# Patient Record
Sex: Male | Born: 1940 | Race: White | Hispanic: No | Smoking: Former smoker
Health system: Southern US, Community
[De-identification: ages and names within clinical notes are randomized; demographics above are authoritative.]

## PROBLEM LIST (undated history)

## (undated) ENCOUNTER — Emergency Department

## (undated) DIAGNOSIS — I513 Intracardiac thrombosis, not elsewhere classified: Secondary | ICD-10-CM

## (undated) DIAGNOSIS — K635 Polyp of colon: Secondary | ICD-10-CM

## (undated) DIAGNOSIS — H269 Unspecified cataract: Secondary | ICD-10-CM

## (undated) DIAGNOSIS — Z860100 Personal history of colon polyps, unspecified: Secondary | ICD-10-CM

## (undated) DIAGNOSIS — U071 COVID-19: Secondary | ICD-10-CM

## (undated) DIAGNOSIS — I4891 Unspecified atrial fibrillation: Secondary | ICD-10-CM

## (undated) DIAGNOSIS — T7840XA Allergy, unspecified, initial encounter: Secondary | ICD-10-CM

## (undated) DIAGNOSIS — I639 Cerebral infarction, unspecified: Secondary | ICD-10-CM

## (undated) DIAGNOSIS — E785 Hyperlipidemia, unspecified: Secondary | ICD-10-CM

## (undated) DIAGNOSIS — D696 Thrombocytopenia, unspecified: Secondary | ICD-10-CM

## (undated) DIAGNOSIS — C4490 Unspecified malignant neoplasm of skin, unspecified: Secondary | ICD-10-CM

## (undated) DIAGNOSIS — C44310 Basal cell carcinoma of skin of unspecified parts of face: Secondary | ICD-10-CM

## (undated) DIAGNOSIS — M1990 Unspecified osteoarthritis, unspecified site: Secondary | ICD-10-CM

## (undated) DIAGNOSIS — M7541 Impingement syndrome of right shoulder: Secondary | ICD-10-CM

## (undated) DIAGNOSIS — I10 Essential (primary) hypertension: Secondary | ICD-10-CM

## (undated) DIAGNOSIS — D123 Benign neoplasm of transverse colon: Secondary | ICD-10-CM

## (undated) DIAGNOSIS — I2510 Atherosclerotic heart disease of native coronary artery without angina pectoris: Secondary | ICD-10-CM

## (undated) DIAGNOSIS — K219 Gastro-esophageal reflux disease without esophagitis: Secondary | ICD-10-CM

## (undated) DIAGNOSIS — C320 Malignant neoplasm of glottis: Secondary | ICD-10-CM

## (undated) DIAGNOSIS — Z923 Personal history of irradiation: Secondary | ICD-10-CM

## (undated) DIAGNOSIS — D120 Benign neoplasm of cecum: Secondary | ICD-10-CM

## (undated) DIAGNOSIS — I219 Acute myocardial infarction, unspecified: Secondary | ICD-10-CM

## (undated) DIAGNOSIS — H1851 Endothelial corneal dystrophy: Secondary | ICD-10-CM

## (undated) DIAGNOSIS — I519 Heart disease, unspecified: Secondary | ICD-10-CM

## (undated) DIAGNOSIS — E119 Type 2 diabetes mellitus without complications: Secondary | ICD-10-CM

## (undated) DIAGNOSIS — I255 Ischemic cardiomyopathy: Secondary | ICD-10-CM

## (undated) DIAGNOSIS — E559 Vitamin D deficiency, unspecified: Secondary | ICD-10-CM

## (undated) HISTORY — DX: Hyperlipidemia, unspecified: E78.5

## (undated) HISTORY — DX: Impingement syndrome of right shoulder: M75.41

## (undated) HISTORY — DX: Personal history of colonic polyps: Z86.010

## (undated) HISTORY — DX: COVID-19: U07.1

## (undated) HISTORY — DX: Essential (primary) hypertension: I10

## (undated) HISTORY — PX: MOHS SURGERY: SUR867

## (undated) HISTORY — DX: Endothelial corneal dystrophy: H18.51

## (undated) HISTORY — DX: Personal history of colon polyps, unspecified: Z86.0100

## (undated) HISTORY — PX: KNEE ARTHROSCOPY: SUR90

## (undated) HISTORY — DX: Personal history of irradiation: Z92.3

## (undated) HISTORY — DX: Unspecified malignant neoplasm of skin, unspecified: C44.90

## (undated) HISTORY — DX: Heart disease, unspecified: I51.9

## (undated) HISTORY — DX: Benign neoplasm of cecum: D12.0

## (undated) HISTORY — DX: Cerebral infarction, unspecified: I63.9

## (undated) HISTORY — PX: OTHER SURGICAL HISTORY: SHX169

## (undated) HISTORY — DX: Intracardiac thrombosis, not elsewhere classified: I51.3

## (undated) HISTORY — DX: Polyp of colon: K63.5

## (undated) HISTORY — DX: Malignant neoplasm of glottis: C32.0

## (undated) HISTORY — PX: JOINT REPLACEMENT: SHX530

## (undated) HISTORY — DX: Vitamin D deficiency, unspecified: E55.9

## (undated) HISTORY — DX: Unspecified cataract: H26.9

## (undated) HISTORY — DX: Benign neoplasm of transverse colon: D12.3

## (undated) HISTORY — DX: Ischemic cardiomyopathy: I25.5

## (undated) HISTORY — DX: Acute myocardial infarction, unspecified: I21.9

## (undated) HISTORY — PX: EYE SURGERY: SHX253

## (undated) HISTORY — DX: Unspecified osteoarthritis, unspecified site: M19.90

## (undated) HISTORY — DX: Basal cell carcinoma of skin of unspecified parts of face: C44.310

## (undated) HISTORY — DX: Unspecified atrial fibrillation: I48.91

## (undated) HISTORY — DX: Allergy, unspecified, initial encounter: T78.40XA

## (undated) HISTORY — PX: HAND SURGERY: SHX662

## (undated) HISTORY — DX: Thrombocytopenia, unspecified: D69.6

---
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
## 1981-11-02 DIAGNOSIS — I4891 Unspecified atrial fibrillation: Secondary | ICD-10-CM

## 1981-11-02 HISTORY — DX: Unspecified atrial fibrillation: I48.91

## 1991-11-03 HISTORY — PX: BICEPS TENDON REPAIR: SHX566

## 2005-11-02 HISTORY — PX: COLONOSCOPY: SHX174

## 2006-11-02 DIAGNOSIS — C320 Malignant neoplasm of glottis: Secondary | ICD-10-CM

## 2006-11-02 HISTORY — DX: Malignant neoplasm of glottis: C32.0

## 2013-09-04 DIAGNOSIS — H3553 Other dystrophies primarily involving the sensory retina: Secondary | ICD-10-CM | POA: Insufficient documentation

## 2013-09-04 HISTORY — DX: Other dystrophies primarily involving the sensory retina: H35.53

## 2013-12-12 DIAGNOSIS — J370 Chronic laryngitis: Secondary | ICD-10-CM | POA: Diagnosis not present

## 2013-12-12 DIAGNOSIS — H903 Sensorineural hearing loss, bilateral: Secondary | ICD-10-CM | POA: Diagnosis not present

## 2013-12-28 DIAGNOSIS — R5381 Other malaise: Secondary | ICD-10-CM | POA: Diagnosis not present

## 2013-12-28 DIAGNOSIS — Z125 Encounter for screening for malignant neoplasm of prostate: Secondary | ICD-10-CM | POA: Diagnosis not present

## 2013-12-28 DIAGNOSIS — Z0000 Encounter for general adult medical examination without abnormal findings: Secondary | ICD-10-CM | POA: Diagnosis not present

## 2013-12-28 DIAGNOSIS — R5383 Other fatigue: Secondary | ICD-10-CM | POA: Diagnosis not present

## 2013-12-28 DIAGNOSIS — E782 Mixed hyperlipidemia: Secondary | ICD-10-CM | POA: Diagnosis not present

## 2013-12-28 LAB — TSH: TSH: 1.32

## 2013-12-28 LAB — PSA: PSA: 1.76

## 2014-01-03 DIAGNOSIS — Z23 Encounter for immunization: Secondary | ICD-10-CM | POA: Diagnosis not present

## 2014-01-03 DIAGNOSIS — Z6829 Body mass index (BMI) 29.0-29.9, adult: Secondary | ICD-10-CM | POA: Diagnosis not present

## 2014-01-03 DIAGNOSIS — Z0000 Encounter for general adult medical examination without abnormal findings: Secondary | ICD-10-CM | POA: Diagnosis not present

## 2014-01-03 DIAGNOSIS — Z125 Encounter for screening for malignant neoplasm of prostate: Secondary | ICD-10-CM | POA: Diagnosis not present

## 2014-01-03 DIAGNOSIS — R5383 Other fatigue: Secondary | ICD-10-CM | POA: Diagnosis not present

## 2014-01-03 DIAGNOSIS — R5381 Other malaise: Secondary | ICD-10-CM | POA: Diagnosis not present

## 2014-01-03 DIAGNOSIS — R9431 Abnormal electrocardiogram [ECG] [EKG]: Secondary | ICD-10-CM | POA: Diagnosis not present

## 2014-01-03 DIAGNOSIS — Z1211 Encounter for screening for malignant neoplasm of colon: Secondary | ICD-10-CM | POA: Diagnosis not present

## 2014-01-03 LAB — FECAL OCCULT BLOOD, IMMUNOCHEMICAL: Fecal Occult Blood: NEGATIVE

## 2014-01-09 DIAGNOSIS — H2510 Age-related nuclear cataract, unspecified eye: Secondary | ICD-10-CM | POA: Diagnosis not present

## 2014-01-09 DIAGNOSIS — H18519 Endothelial corneal dystrophy, unspecified eye: Secondary | ICD-10-CM | POA: Diagnosis not present

## 2014-01-09 DIAGNOSIS — H353190 Nonexudative age-related macular degeneration, unspecified eye, stage unspecified: Secondary | ICD-10-CM | POA: Diagnosis not present

## 2014-01-15 DIAGNOSIS — L821 Other seborrheic keratosis: Secondary | ICD-10-CM | POA: Diagnosis not present

## 2014-01-15 DIAGNOSIS — C44519 Basal cell carcinoma of skin of other part of trunk: Secondary | ICD-10-CM | POA: Diagnosis not present

## 2014-01-15 DIAGNOSIS — L820 Inflamed seborrheic keratosis: Secondary | ICD-10-CM | POA: Diagnosis not present

## 2014-01-15 DIAGNOSIS — Z85828 Personal history of other malignant neoplasm of skin: Secondary | ICD-10-CM | POA: Diagnosis not present

## 2014-01-15 DIAGNOSIS — I781 Nevus, non-neoplastic: Secondary | ICD-10-CM | POA: Diagnosis not present

## 2014-01-15 DIAGNOSIS — L570 Actinic keratosis: Secondary | ICD-10-CM | POA: Diagnosis not present

## 2014-01-15 DIAGNOSIS — D235 Other benign neoplasm of skin of trunk: Secondary | ICD-10-CM | POA: Diagnosis not present

## 2014-02-12 DIAGNOSIS — L570 Actinic keratosis: Secondary | ICD-10-CM | POA: Diagnosis not present

## 2014-02-19 DIAGNOSIS — C44519 Basal cell carcinoma of skin of other part of trunk: Secondary | ICD-10-CM | POA: Diagnosis not present

## 2014-04-25 DIAGNOSIS — C44519 Basal cell carcinoma of skin of other part of trunk: Secondary | ICD-10-CM | POA: Diagnosis not present

## 2014-05-09 DIAGNOSIS — L570 Actinic keratosis: Secondary | ICD-10-CM | POA: Diagnosis not present

## 2014-05-30 DIAGNOSIS — E1310 Other specified diabetes mellitus with ketoacidosis without coma: Secondary | ICD-10-CM | POA: Diagnosis not present

## 2014-05-30 DIAGNOSIS — I491 Atrial premature depolarization: Secondary | ICD-10-CM | POA: Diagnosis not present

## 2014-05-30 DIAGNOSIS — Z923 Personal history of irradiation: Secondary | ICD-10-CM | POA: Diagnosis not present

## 2014-05-30 DIAGNOSIS — I10 Essential (primary) hypertension: Secondary | ICD-10-CM | POA: Diagnosis present

## 2014-05-30 DIAGNOSIS — R9431 Abnormal electrocardiogram [ECG] [EKG]: Secondary | ICD-10-CM | POA: Diagnosis not present

## 2014-05-30 DIAGNOSIS — K219 Gastro-esophageal reflux disease without esophagitis: Secondary | ICD-10-CM | POA: Diagnosis present

## 2014-05-30 DIAGNOSIS — I2699 Other pulmonary embolism without acute cor pulmonale: Secondary | ICD-10-CM | POA: Diagnosis not present

## 2014-05-30 DIAGNOSIS — I059 Rheumatic mitral valve disease, unspecified: Secondary | ICD-10-CM | POA: Diagnosis not present

## 2014-05-30 DIAGNOSIS — I2109 ST elevation (STEMI) myocardial infarction involving other coronary artery of anterior wall: Secondary | ICD-10-CM | POA: Diagnosis not present

## 2014-05-30 DIAGNOSIS — R079 Chest pain, unspecified: Secondary | ICD-10-CM | POA: Diagnosis not present

## 2014-05-30 DIAGNOSIS — I253 Aneurysm of heart: Secondary | ICD-10-CM | POA: Diagnosis not present

## 2014-05-30 DIAGNOSIS — C139 Malignant neoplasm of hypopharynx, unspecified: Secondary | ICD-10-CM | POA: Diagnosis not present

## 2014-05-30 DIAGNOSIS — M129 Arthropathy, unspecified: Secondary | ICD-10-CM | POA: Diagnosis not present

## 2014-05-30 DIAGNOSIS — I5189 Other ill-defined heart diseases: Secondary | ICD-10-CM | POA: Diagnosis present

## 2014-05-30 DIAGNOSIS — I2589 Other forms of chronic ischemic heart disease: Secondary | ICD-10-CM | POA: Diagnosis not present

## 2014-05-30 DIAGNOSIS — I1311 Hypertensive heart and chronic kidney disease without heart failure, with stage 5 chronic kidney disease, or end stage renal disease: Secondary | ICD-10-CM | POA: Diagnosis not present

## 2014-05-30 DIAGNOSIS — I509 Heart failure, unspecified: Secondary | ICD-10-CM | POA: Diagnosis not present

## 2014-05-30 DIAGNOSIS — I2510 Atherosclerotic heart disease of native coronary artery without angina pectoris: Secondary | ICD-10-CM | POA: Diagnosis not present

## 2014-05-30 DIAGNOSIS — I729 Aneurysm of unspecified site: Secondary | ICD-10-CM | POA: Diagnosis not present

## 2014-05-30 DIAGNOSIS — E785 Hyperlipidemia, unspecified: Secondary | ICD-10-CM | POA: Diagnosis present

## 2014-05-30 DIAGNOSIS — I080 Rheumatic disorders of both mitral and aortic valves: Secondary | ICD-10-CM | POA: Diagnosis present

## 2014-05-30 DIAGNOSIS — Z85819 Personal history of malignant neoplasm of unspecified site of lip, oral cavity, and pharynx: Secondary | ICD-10-CM | POA: Diagnosis not present

## 2014-05-30 DIAGNOSIS — D696 Thrombocytopenia, unspecified: Secondary | ICD-10-CM | POA: Diagnosis not present

## 2014-05-30 DIAGNOSIS — I5042 Chronic combined systolic (congestive) and diastolic (congestive) heart failure: Secondary | ICD-10-CM | POA: Diagnosis not present

## 2014-05-30 DIAGNOSIS — I2541 Coronary artery aneurysm: Secondary | ICD-10-CM | POA: Diagnosis not present

## 2014-05-30 DIAGNOSIS — I4949 Other premature depolarization: Secondary | ICD-10-CM | POA: Diagnosis present

## 2014-05-30 DIAGNOSIS — I219 Acute myocardial infarction, unspecified: Secondary | ICD-10-CM | POA: Diagnosis not present

## 2014-05-30 DIAGNOSIS — I749 Embolism and thrombosis of unspecified artery: Secondary | ICD-10-CM | POA: Diagnosis not present

## 2014-05-30 DIAGNOSIS — R030 Elevated blood-pressure reading, without diagnosis of hypertension: Secondary | ICD-10-CM | POA: Diagnosis not present

## 2014-05-30 DIAGNOSIS — Z87891 Personal history of nicotine dependence: Secondary | ICD-10-CM | POA: Diagnosis not present

## 2014-05-30 DIAGNOSIS — I428 Other cardiomyopathies: Secondary | ICD-10-CM | POA: Diagnosis not present

## 2014-05-30 DIAGNOSIS — M1710 Unilateral primary osteoarthritis, unspecified knee: Secondary | ICD-10-CM | POA: Diagnosis present

## 2014-05-30 DIAGNOSIS — I079 Rheumatic tricuspid valve disease, unspecified: Secondary | ICD-10-CM | POA: Diagnosis not present

## 2014-06-05 DIAGNOSIS — I219 Acute myocardial infarction, unspecified: Secondary | ICD-10-CM | POA: Diagnosis not present

## 2014-06-08 DIAGNOSIS — I219 Acute myocardial infarction, unspecified: Secondary | ICD-10-CM | POA: Diagnosis not present

## 2014-06-11 DIAGNOSIS — I219 Acute myocardial infarction, unspecified: Secondary | ICD-10-CM | POA: Diagnosis not present

## 2014-06-11 DIAGNOSIS — I2589 Other forms of chronic ischemic heart disease: Secondary | ICD-10-CM | POA: Diagnosis not present

## 2014-06-11 DIAGNOSIS — Z6828 Body mass index (BMI) 28.0-28.9, adult: Secondary | ICD-10-CM | POA: Diagnosis not present

## 2014-06-11 DIAGNOSIS — M1710 Unilateral primary osteoarthritis, unspecified knee: Secondary | ICD-10-CM | POA: Diagnosis not present

## 2014-06-12 DIAGNOSIS — I2510 Atherosclerotic heart disease of native coronary artery without angina pectoris: Secondary | ICD-10-CM | POA: Diagnosis not present

## 2014-06-12 DIAGNOSIS — I428 Other cardiomyopathies: Secondary | ICD-10-CM | POA: Diagnosis not present

## 2014-06-12 DIAGNOSIS — C139 Malignant neoplasm of hypopharynx, unspecified: Secondary | ICD-10-CM | POA: Diagnosis not present

## 2014-06-12 DIAGNOSIS — I219 Acute myocardial infarction, unspecified: Secondary | ICD-10-CM | POA: Diagnosis not present

## 2014-06-18 DIAGNOSIS — I219 Acute myocardial infarction, unspecified: Secondary | ICD-10-CM | POA: Diagnosis not present

## 2014-06-25 DIAGNOSIS — I219 Acute myocardial infarction, unspecified: Secondary | ICD-10-CM | POA: Diagnosis not present

## 2014-07-06 DIAGNOSIS — I219 Acute myocardial infarction, unspecified: Secondary | ICD-10-CM | POA: Diagnosis not present

## 2014-07-12 DIAGNOSIS — H2510 Age-related nuclear cataract, unspecified eye: Secondary | ICD-10-CM | POA: Diagnosis not present

## 2014-07-12 DIAGNOSIS — H353190 Nonexudative age-related macular degeneration, unspecified eye, stage unspecified: Secondary | ICD-10-CM | POA: Diagnosis not present

## 2014-07-12 DIAGNOSIS — H25019 Cortical age-related cataract, unspecified eye: Secondary | ICD-10-CM | POA: Diagnosis not present

## 2014-07-30 DIAGNOSIS — Z23 Encounter for immunization: Secondary | ICD-10-CM | POA: Diagnosis not present

## 2014-07-30 DIAGNOSIS — M1710 Unilateral primary osteoarthritis, unspecified knee: Secondary | ICD-10-CM | POA: Diagnosis not present

## 2014-07-30 DIAGNOSIS — I219 Acute myocardial infarction, unspecified: Secondary | ICD-10-CM | POA: Diagnosis not present

## 2014-07-30 DIAGNOSIS — Z6829 Body mass index (BMI) 29.0-29.9, adult: Secondary | ICD-10-CM | POA: Diagnosis not present

## 2014-07-30 DIAGNOSIS — I2589 Other forms of chronic ischemic heart disease: Secondary | ICD-10-CM | POA: Diagnosis not present

## 2014-08-03 DIAGNOSIS — I213 ST elevation (STEMI) myocardial infarction of unspecified site: Secondary | ICD-10-CM | POA: Diagnosis not present

## 2014-08-08 DIAGNOSIS — L821 Other seborrheic keratosis: Secondary | ICD-10-CM | POA: Diagnosis not present

## 2014-08-08 DIAGNOSIS — D1801 Hemangioma of skin and subcutaneous tissue: Secondary | ICD-10-CM | POA: Diagnosis not present

## 2014-08-08 DIAGNOSIS — L570 Actinic keratosis: Secondary | ICD-10-CM | POA: Diagnosis not present

## 2014-08-08 DIAGNOSIS — Z7189 Other specified counseling: Secondary | ICD-10-CM | POA: Diagnosis not present

## 2014-08-08 DIAGNOSIS — Z85828 Personal history of other malignant neoplasm of skin: Secondary | ICD-10-CM | POA: Diagnosis not present

## 2014-08-10 DIAGNOSIS — I213 ST elevation (STEMI) myocardial infarction of unspecified site: Secondary | ICD-10-CM | POA: Diagnosis not present

## 2014-08-17 DIAGNOSIS — I213 ST elevation (STEMI) myocardial infarction of unspecified site: Secondary | ICD-10-CM | POA: Diagnosis not present

## 2014-08-31 DIAGNOSIS — I213 ST elevation (STEMI) myocardial infarction of unspecified site: Secondary | ICD-10-CM | POA: Diagnosis not present

## 2014-09-07 DIAGNOSIS — I213 ST elevation (STEMI) myocardial infarction of unspecified site: Secondary | ICD-10-CM | POA: Diagnosis not present

## 2014-09-07 DIAGNOSIS — I429 Cardiomyopathy, unspecified: Secondary | ICD-10-CM | POA: Diagnosis not present

## 2014-09-07 DIAGNOSIS — I2510 Atherosclerotic heart disease of native coronary artery without angina pectoris: Secondary | ICD-10-CM | POA: Diagnosis not present

## 2014-09-07 DIAGNOSIS — E785 Hyperlipidemia, unspecified: Secondary | ICD-10-CM | POA: Diagnosis not present

## 2014-09-17 DIAGNOSIS — I428 Other cardiomyopathies: Secondary | ICD-10-CM | POA: Diagnosis not present

## 2014-09-17 DIAGNOSIS — I2510 Atherosclerotic heart disease of native coronary artery without angina pectoris: Secondary | ICD-10-CM | POA: Diagnosis not present

## 2014-09-18 DIAGNOSIS — I429 Cardiomyopathy, unspecified: Secondary | ICD-10-CM | POA: Diagnosis not present

## 2014-09-18 DIAGNOSIS — I2510 Atherosclerotic heart disease of native coronary artery without angina pectoris: Secondary | ICD-10-CM | POA: Diagnosis not present

## 2014-09-21 DIAGNOSIS — I213 ST elevation (STEMI) myocardial infarction of unspecified site: Secondary | ICD-10-CM | POA: Diagnosis not present

## 2014-10-23 DIAGNOSIS — I213 ST elevation (STEMI) myocardial infarction of unspecified site: Secondary | ICD-10-CM | POA: Diagnosis not present

## 2014-11-02 DIAGNOSIS — H1851 Endothelial corneal dystrophy: Secondary | ICD-10-CM

## 2014-11-02 DIAGNOSIS — H18519 Endothelial corneal dystrophy, unspecified eye: Secondary | ICD-10-CM

## 2014-11-02 HISTORY — DX: Endothelial corneal dystrophy, unspecified eye: H18.519

## 2014-11-13 DIAGNOSIS — E785 Hyperlipidemia, unspecified: Secondary | ICD-10-CM | POA: Diagnosis not present

## 2014-11-13 DIAGNOSIS — I429 Cardiomyopathy, unspecified: Secondary | ICD-10-CM | POA: Diagnosis not present

## 2014-11-13 DIAGNOSIS — I2510 Atherosclerotic heart disease of native coronary artery without angina pectoris: Secondary | ICD-10-CM | POA: Diagnosis not present

## 2014-11-13 DIAGNOSIS — I213 ST elevation (STEMI) myocardial infarction of unspecified site: Secondary | ICD-10-CM | POA: Diagnosis not present

## 2014-11-20 DIAGNOSIS — I213 ST elevation (STEMI) myocardial infarction of unspecified site: Secondary | ICD-10-CM | POA: Diagnosis not present

## 2014-11-28 DIAGNOSIS — I213 ST elevation (STEMI) myocardial infarction of unspecified site: Secondary | ICD-10-CM | POA: Diagnosis not present

## 2014-11-28 DIAGNOSIS — I255 Ischemic cardiomyopathy: Secondary | ICD-10-CM | POA: Diagnosis not present

## 2014-11-28 LAB — LIPID PANEL
CHOLESTEROL: 155
CHOLESTEROL: 155
HDL Cholesterol: 48
HDL: 48 mg/dL (ref 35–70)
LDL (calc): 82
LDL (calc): 82
Triglycerides: 126
Triglycerides: 126

## 2014-11-28 LAB — COMPREHENSIVE METABOLIC PANEL
ALK PHOS: 53 U/L
ALT: 27
ALT: 27
AST: 21 U/L
AST: 21 U/L
Albumin: 4
Alkaline Phosphatase: 53 U/L
BILIRUBIN TOTAL: 0.5 mg/dL
BUN: 12 mg/dL (ref 4–21)
CREATININE: 0.85
Creat: 0.85
Glucose: 112
Glucose: 112
SODIUM: 141
Sodium: 141
Total Bilirubin: 0.5 mg/dL

## 2014-11-28 LAB — CBC
HEMOGLOBIN: 14.6 g/dL
HGB: 14.6 g/dL
PLATELETS: 158
WBC: 6.2
WBC: 6.2
platelet count: 158

## 2014-12-03 DIAGNOSIS — I213 ST elevation (STEMI) myocardial infarction of unspecified site: Secondary | ICD-10-CM | POA: Diagnosis not present

## 2014-12-05 DIAGNOSIS — M1710 Unilateral primary osteoarthritis, unspecified knee: Secondary | ICD-10-CM | POA: Diagnosis not present

## 2014-12-05 DIAGNOSIS — I213 ST elevation (STEMI) myocardial infarction of unspecified site: Secondary | ICD-10-CM | POA: Diagnosis not present

## 2014-12-05 DIAGNOSIS — K219 Gastro-esophageal reflux disease without esophagitis: Secondary | ICD-10-CM | POA: Diagnosis not present

## 2014-12-05 DIAGNOSIS — I255 Ischemic cardiomyopathy: Secondary | ICD-10-CM | POA: Diagnosis not present

## 2015-01-02 ENCOUNTER — Encounter: Payer: Self-pay | Admitting: Cardiovascular Disease

## 2015-01-02 ENCOUNTER — Ambulatory Visit (INDEPENDENT_AMBULATORY_CARE_PROVIDER_SITE_OTHER): Payer: Medicare Other | Admitting: Cardiovascular Disease

## 2015-01-02 ENCOUNTER — Encounter (INDEPENDENT_AMBULATORY_CARE_PROVIDER_SITE_OTHER): Payer: Self-pay

## 2015-01-02 ENCOUNTER — Ambulatory Visit (INDEPENDENT_AMBULATORY_CARE_PROVIDER_SITE_OTHER): Payer: Medicare Other | Admitting: *Deleted

## 2015-01-02 VITALS — BP 168/100 | HR 61 | Ht 74.0 in | Wt 230.5 lb

## 2015-01-02 DIAGNOSIS — I10 Essential (primary) hypertension: Secondary | ICD-10-CM

## 2015-01-02 DIAGNOSIS — I213 ST elevation (STEMI) myocardial infarction of unspecified site: Secondary | ICD-10-CM

## 2015-01-02 DIAGNOSIS — E785 Hyperlipidemia, unspecified: Secondary | ICD-10-CM | POA: Insufficient documentation

## 2015-01-02 DIAGNOSIS — M1990 Unspecified osteoarthritis, unspecified site: Secondary | ICD-10-CM | POA: Insufficient documentation

## 2015-01-02 DIAGNOSIS — I513 Intracardiac thrombosis, not elsewhere classified: Secondary | ICD-10-CM

## 2015-01-02 DIAGNOSIS — M179 Osteoarthritis of knee, unspecified: Secondary | ICD-10-CM

## 2015-01-02 DIAGNOSIS — I236 Thrombosis of atrium, auricular appendage, and ventricle as current complications following acute myocardial infarction: Secondary | ICD-10-CM

## 2015-01-02 DIAGNOSIS — I2510 Atherosclerotic heart disease of native coronary artery without angina pectoris: Secondary | ICD-10-CM | POA: Diagnosis not present

## 2015-01-02 DIAGNOSIS — I255 Ischemic cardiomyopathy: Secondary | ICD-10-CM

## 2015-01-02 DIAGNOSIS — M1710 Unilateral primary osteoarthritis, unspecified knee: Secondary | ICD-10-CM

## 2015-01-02 HISTORY — DX: Essential (primary) hypertension: I10

## 2015-01-02 HISTORY — DX: Ischemic cardiomyopathy: I25.5

## 2015-01-02 LAB — POCT INR: INR: 2.2

## 2015-01-02 MED ORDER — METOPROLOL SUCCINATE ER 25 MG PO TB24: 25.0000 mg | ORAL_TABLET | Freq: Every day | ORAL | Status: DC

## 2015-01-02 MED ORDER — SIMVASTATIN 40 MG PO TABS: 40.0000 mg | ORAL_TABLET | Freq: Every day | ORAL | Status: DC

## 2015-01-02 MED ORDER — LISINOPRIL 2.5 MG PO TABS: 2.5000 mg | ORAL_TABLET | Freq: Every day | ORAL | Status: DC

## 2015-01-02 NOTE — Assessment & Plan Note (Signed)
Severe ostioarthritis of the knees

## 2015-01-02 NOTE — Progress Notes (Signed)
Patient ID: Alexsandro Salek, male    DOB: Mar 19, 1941, 74 y.o.   MRN: [id number]  HPI Comments: Mr. Dall is a 74 year old male with coronary artery disease, ischemic cardiomyopathy, initial ejection fraction in July 2015 of 35%, cardiac PET scan showing scar in the apical and periapical region, mural thrombus seen in July 2015, started on anticoagulation, history of hyperlipidemia who presents to establish care in the [place] office  He reports that [place] and is establishing care. He denies any chest pain, shortness of breath symptoms. Prior to his MI, he denies having any symptoms as well He was noted to have an abnormal EKG and then was referred to cardiology. They had talked about doing a cardiac catheterization but instead did a PET viability study.   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 acetaminophen (TYLENOL) 500 MG tablet Take 500 mg by mouth every 6 (six) hours as needed.  Marland Kitchen aspirin 81 MG tablet Take 81 mg by mouth daily.  . fluticasone (FLONASE) 50 MCG/ACT nasal spray Place into both nostrils daily.  . lansoprazole (PREVACID) 30 MG capsule Take 30 mg by mouth daily at 12 noon.  Marland Kitchen lisinopril (PRINIVIL,ZESTRIL) 2.5 MG tablet Take 1  tablet (2.5 mg total) by mouth daily.  . meloxicam (MOBIC) 15 MG tablet Take 0.5 mg by mouth once a week.   . metoprolol succinate (TOPROL-XL) 25 MG 24 hr tablet Take 1 tablet (25 mg total) by mouth daily.  . ranitidine (ZANTAC) 150 MG tablet Take 150 mg by mouth at bedtime.   . simvastatin (ZOCOR) 40 MG tablet Take 1 tablet (40 mg total) by mouth daily at 6 PM.  . warfarin (COUMADIN) 5 MG tablet Take 5 mg on Tuesday, Thursday, Saturday and Sunday and 2.5 mg on Monday & Wednesday.  . [DISCONTINUED] lisinopril (PRINIVIL,ZESTRIL) 2.5 MG tablet Take 2.5 mg by mouth daily.   . [DISCONTINUED] metoprolol succinate (TOPROL-XL) 25 MG 24 hr tablet Take 25 mg by mouth daily.   . [DISCONTINUED] simvastatin (ZOCOR) 20 MG tablet Take 20 mg by mouth daily at 6 PM.     Past Medical History  Diagnosis Date  . Squamous cell carcinoma   . Squamous cell cancer of hypopharynx   . Arthritis   . Mural thrombus of cardiac apex   . Cardiomyopathy   . Dyslipidemia   . Radiation     right vocal cord   . Left ventricular apical thrombus     Past Surgical History  Procedure Laterality Date  . Tonsillectomy    . Biceps tendon repair    . Knee arthroscopy    . Knee arthroscopy w/ osteochondral autograft      Social History  reports that he has quit smoking. His smoking use included Cigarettes. He has a 5 pack-year smoking history. He  does not have any smokeless tobacco history on file. He reports that he drinks alcohol. He reports that he does not use illicit drugs.  Family History family history includes Heart attack (age of onset: 58) in his father; Hyperlipidemia in his father; Hypertension in his father.   Review of Systems  Constitutional: Negative.   Respiratory: Negative.   Cardiovascular: Negative.   Gastrointestinal: Negative.   Musculoskeletal: Positive for arthralgias.  Skin: Negative.   Neurological: Negative.   Hematological: Negative.   Psychiatric/Behavioral: Negative.   All other  systems reviewed and are negative.   BP 168/100 mmHg  Pulse 61  Ht 6\' 2"  (1.88 m)  Wt 230 lb 8 oz (104.554 kg)  BMI 29.58 kg/m2   Physical Exam  Constitutional: He is oriented to person, place, and time. He appears well-developed and well-nourished.  HENT:  Head: Normocephalic.  Nose: Nose normal.  Mouth/Throat: Oropharynx is clear and moist.  Eyes: Conjunctivae are normal. Pupils are equal, round, and reactive to light.  Neck: Normal range of motion. Neck supple. No JVD present.  Cardiovascular: Normal rate, regular rhythm, S1 normal, S2 normal, normal heart sounds and intact distal pulses.  Exam reveals no gallop and no friction rub.   No murmur heard. Pulmonary/Chest: Effort normal and breath sounds normal. No respiratory distress. He has no wheezes. He has no rales. He exhibits no tenderness.  Abdominal: Soft. Bowel sounds are normal. He exhibits no distension. There is no tenderness.  Musculoskeletal: Normal range of motion. He exhibits no edema or tenderness.  Lymphadenopathy:    He has no cervical adenopathy.  Neurological: He is alert and oriented to person, place, and time. Coordination normal.  Skin: Skin is warm and dry. No rash noted. No erythema.  Psychiatric: He has a normal mood and affect. His behavior is normal. Judgment and thought content normal.      Assessment and Plan   Nursing note and vitals reviewed.

## 2015-01-02 NOTE — Assessment & Plan Note (Signed)
Currently with no symptoms of angina. No further workup at this time. Continue current medication regimen. We did discuss if he has any additional symptoms of chest pain or shortness of breath, we would proceed with cardiac catheterization

## 2015-01-02 NOTE — Patient Instructions (Signed)
You are doing well. Please call the office if you have any chest pain and shortness of breath symptoms  CHECK THE PRICE OF Shannon Chung, PRADAXA  Please increase the simvastatin up to 40 mg daily  Please call us if you have new issues that need to be addressed before your next appt.  Your physician wants you to follow-up in: 6 months.  You will receive a reminder letter in the mail two months in advance. If you don't receive a letter, please call our office to schedule the follow-up appointment.

## 2015-01-02 NOTE — Assessment & Plan Note (Signed)
Initial ejection fraction 35%. Increased up to 45% in follow-up echocardiogram and PET scan at the end of 2015. We'll continue current medications, beta blocker, ACE inhibitor Appears euvolemic

## 2015-01-02 NOTE — Assessment & Plan Note (Signed)
We did spend some time discussing other options for anticoagulation. He will look into the prices of these other medications INR 2.2 on today's visit. Would likely need anticoagulation indefinitely

## 2015-01-02 NOTE — Assessment & Plan Note (Signed)
Blood pressure is well controlled on today's visit. No changes made to the medications. 

## 2015-01-02 NOTE — Assessment & Plan Note (Signed)
We have recommended he increase his simvastatin up to 80 mg daily. Goal LDL less than 70

## 2015-01-14 DIAGNOSIS — H2513 Age-related nuclear cataract, bilateral: Secondary | ICD-10-CM | POA: Diagnosis not present

## 2015-01-30 ENCOUNTER — Ambulatory Visit (INDEPENDENT_AMBULATORY_CARE_PROVIDER_SITE_OTHER): Payer: Medicare Other

## 2015-01-30 DIAGNOSIS — I213 ST elevation (STEMI) myocardial infarction of unspecified site: Secondary | ICD-10-CM | POA: Diagnosis not present

## 2015-01-30 DIAGNOSIS — I513 Intracardiac thrombosis, not elsewhere classified: Secondary | ICD-10-CM

## 2015-01-30 DIAGNOSIS — I236 Thrombosis of atrium, auricular appendage, and ventricle as current complications following acute myocardial infarction: Secondary | ICD-10-CM

## 2015-01-30 LAB — POCT INR: INR: 2.2

## 2015-02-19 ENCOUNTER — Other Ambulatory Visit: Payer: Self-pay | Admitting: Cardiovascular Disease

## 2015-02-19 NOTE — Telephone Encounter (Signed)
Please review refill for Warfarin. Thanks!

## 2015-02-27 ENCOUNTER — Ambulatory Visit (INDEPENDENT_AMBULATORY_CARE_PROVIDER_SITE_OTHER): Payer: Medicare Other | Admitting: *Deleted

## 2015-02-27 DIAGNOSIS — I236 Thrombosis of atrium, auricular appendage, and ventricle as current complications following acute myocardial infarction: Secondary | ICD-10-CM

## 2015-02-27 DIAGNOSIS — I213 ST elevation (STEMI) myocardial infarction of unspecified site: Secondary | ICD-10-CM

## 2015-02-27 DIAGNOSIS — I513 Intracardiac thrombosis, not elsewhere classified: Secondary | ICD-10-CM | POA: Diagnosis not present

## 2015-02-27 LAB — POCT INR: INR: 1.1

## 2015-03-06 ENCOUNTER — Ambulatory Visit (INDEPENDENT_AMBULATORY_CARE_PROVIDER_SITE_OTHER): Payer: Medicare Other

## 2015-03-06 DIAGNOSIS — I213 ST elevation (STEMI) myocardial infarction of unspecified site: Secondary | ICD-10-CM

## 2015-03-06 DIAGNOSIS — I513 Intracardiac thrombosis, not elsewhere classified: Secondary | ICD-10-CM

## 2015-03-06 DIAGNOSIS — I236 Thrombosis of atrium, auricular appendage, and ventricle as current complications following acute myocardial infarction: Secondary | ICD-10-CM

## 2015-03-06 LAB — POCT INR: INR: 2.2

## 2015-03-20 ENCOUNTER — Ambulatory Visit (INDEPENDENT_AMBULATORY_CARE_PROVIDER_SITE_OTHER): Payer: Medicare Other

## 2015-03-20 DIAGNOSIS — I513 Intracardiac thrombosis, not elsewhere classified: Secondary | ICD-10-CM

## 2015-03-20 DIAGNOSIS — I213 ST elevation (STEMI) myocardial infarction of unspecified site: Secondary | ICD-10-CM | POA: Diagnosis not present

## 2015-03-20 DIAGNOSIS — I236 Thrombosis of atrium, auricular appendage, and ventricle as current complications following acute myocardial infarction: Secondary | ICD-10-CM

## 2015-03-20 LAB — POCT INR: INR: 2.3

## 2015-04-17 ENCOUNTER — Ambulatory Visit (INDEPENDENT_AMBULATORY_CARE_PROVIDER_SITE_OTHER): Payer: Medicare Other

## 2015-04-17 DIAGNOSIS — I513 Intracardiac thrombosis, not elsewhere classified: Secondary | ICD-10-CM | POA: Diagnosis not present

## 2015-04-17 DIAGNOSIS — I213 ST elevation (STEMI) myocardial infarction of unspecified site: Secondary | ICD-10-CM | POA: Diagnosis not present

## 2015-04-17 DIAGNOSIS — I236 Thrombosis of atrium, auricular appendage, and ventricle as current complications following acute myocardial infarction: Secondary | ICD-10-CM

## 2015-04-17 LAB — POCT INR: INR: 2.4

## 2015-05-08 DIAGNOSIS — M7540 Impingement syndrome of unspecified shoulder: Secondary | ICD-10-CM

## 2015-05-08 DIAGNOSIS — M7541 Impingement syndrome of right shoulder: Secondary | ICD-10-CM | POA: Diagnosis not present

## 2015-05-08 DIAGNOSIS — M1712 Unilateral primary osteoarthritis, left knee: Secondary | ICD-10-CM | POA: Diagnosis not present

## 2015-05-08 HISTORY — DX: Impingement syndrome of unspecified shoulder: M75.40

## 2015-05-10 ENCOUNTER — Encounter: Payer: Self-pay | Admitting: Family Medicine

## 2015-05-15 ENCOUNTER — Ambulatory Visit (INDEPENDENT_AMBULATORY_CARE_PROVIDER_SITE_OTHER): Payer: Medicare Other

## 2015-05-15 DIAGNOSIS — I513 Intracardiac thrombosis, not elsewhere classified: Secondary | ICD-10-CM

## 2015-05-15 DIAGNOSIS — I236 Thrombosis of atrium, auricular appendage, and ventricle as current complications following acute myocardial infarction: Secondary | ICD-10-CM

## 2015-05-15 DIAGNOSIS — I213 ST elevation (STEMI) myocardial infarction of unspecified site: Secondary | ICD-10-CM | POA: Diagnosis not present

## 2015-05-15 LAB — POCT INR: INR: 4.4

## 2015-05-16 ENCOUNTER — Encounter: Payer: Self-pay | Admitting: Cardiovascular Disease

## 2015-05-29 ENCOUNTER — Ambulatory Visit (INDEPENDENT_AMBULATORY_CARE_PROVIDER_SITE_OTHER): Payer: Medicare Other

## 2015-05-29 DIAGNOSIS — I513 Intracardiac thrombosis, not elsewhere classified: Secondary | ICD-10-CM

## 2015-05-29 DIAGNOSIS — I236 Thrombosis of atrium, auricular appendage, and ventricle as current complications following acute myocardial infarction: Secondary | ICD-10-CM

## 2015-05-29 DIAGNOSIS — I213 ST elevation (STEMI) myocardial infarction of unspecified site: Secondary | ICD-10-CM | POA: Diagnosis not present

## 2015-05-29 LAB — POCT INR: INR: 3.2

## 2015-06-11 ENCOUNTER — Ambulatory Visit: Payer: Medicare Other | Admitting: Family Medicine

## 2015-06-13 ENCOUNTER — Ambulatory Visit (INDEPENDENT_AMBULATORY_CARE_PROVIDER_SITE_OTHER): Payer: Medicare Other | Admitting: Family Medicine

## 2015-06-13 ENCOUNTER — Encounter: Payer: Self-pay | Admitting: Family Medicine

## 2015-06-13 VITALS — BP 134/84 | HR 64 | Temp 98.0°F | Ht 72.5 in | Wt 231.8 lb

## 2015-06-13 DIAGNOSIS — I2510 Atherosclerotic heart disease of native coronary artery without angina pectoris: Secondary | ICD-10-CM | POA: Diagnosis not present

## 2015-06-13 DIAGNOSIS — E785 Hyperlipidemia, unspecified: Secondary | ICD-10-CM

## 2015-06-13 DIAGNOSIS — M1710 Unilateral primary osteoarthritis, unspecified knee: Secondary | ICD-10-CM

## 2015-06-13 DIAGNOSIS — I10 Essential (primary) hypertension: Secondary | ICD-10-CM

## 2015-06-13 DIAGNOSIS — M179 Osteoarthritis of knee, unspecified: Secondary | ICD-10-CM

## 2015-06-13 DIAGNOSIS — I213 ST elevation (STEMI) myocardial infarction of unspecified site: Secondary | ICD-10-CM | POA: Diagnosis not present

## 2015-06-13 DIAGNOSIS — I513 Intracardiac thrombosis, not elsewhere classified: Secondary | ICD-10-CM

## 2015-06-13 DIAGNOSIS — I255 Ischemic cardiomyopathy: Secondary | ICD-10-CM

## 2015-06-13 DIAGNOSIS — Z923 Personal history of irradiation: Secondary | ICD-10-CM

## 2015-06-13 NOTE — Assessment & Plan Note (Signed)
Check FLP when he returns for medicare wellness visit in 3-4 mo. No results found for: [facility]

## 2015-06-13 NOTE — Assessment & Plan Note (Signed)
Followed by cards. asxs currently.

## 2015-06-13 NOTE — Patient Instructions (Addendum)
Nice to meet you today. Return as needed or in 3-4 months for medicare wellness visit. Return 1 week prior fasting for blood work.

## 2015-06-13 NOTE — Assessment & Plan Note (Signed)
Chronic, stable. Continue current regimen. 

## 2015-06-13 NOTE — Progress Notes (Signed)
Pre visit review using our clinic review tool, if applicable. No additional management support is needed unless otherwise documented below in the visit note. 

## 2015-06-13 NOTE — Assessment & Plan Note (Addendum)
Improved as of last echo. Continue current regimen. On coumadin and aspirin.

## 2015-06-13 NOTE — Assessment & Plan Note (Addendum)
Possible upcoming replacement - awaiting cards eval. He does take sparing NSAID despite coumadin use.

## 2015-06-13 NOTE — Assessment & Plan Note (Signed)
Resolved after coumadin therapy. Followed by coumadin clinic.

## 2015-06-13 NOTE — Progress Notes (Signed)
BP 134/84 mmHg  Pulse 64  Temp(Src) 98 F (36.7 C) (Oral)  Ht 6' 0.5" (1.842 m)  Wt 231 lb 12 oz (105.121 kg)  BMI 30.98 kg/m2   CC: new pt to establish  Subjective:    Patient ID: Shannon Chung, male    DOB: [birth year], 74 y.o.   MRN: [id number]  HPI: Shannon Chung is a 74 y.o. male presenting on 06/13/2015 for Establish Care   Earlier this year moved from [place]. Brings records in flash drive which I will review. He will request records of colonoscopy report.  Sees Dr Rockey Situ for CAD, ischemic cardiomyopathy with EF 35% --> 45% (per pt 50%) and mural thrombus of cardiac apex (resolved with coumadin treatment). On statin, aspirin 81mg , coumadin (followed by coumadin clinic), ACEI and B blocker. Has f/u planned with Dr Rockey Situ tomorrow.   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 Takes zantac QHS PRN for this as well as prevacid 30mg  at noon - ENT told he was prone to GERD.   1/2 tablet meloxicam once weekly for knee aches (although he's on coumadin).   Preventative: Last CPE 12/2013. Due for this.  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 Interim medical history since our last visit reviewed. Allergies and medications reviewed and updated. Current Outpatient Prescriptions on File Prior to Visit  Medication Sig  . acetaminophen (TYLENOL) 500 MG tablet Take 500 mg by mouth every 6 (six) hours as needed.  Marland Kitchen aspirin 81 MG tablet Take 81 mg by mouth daily.  . fluticasone (FLONASE) 50 MCG/ACT nasal spray Place into both nostrils daily.  . lansoprazole (PREVACID) 30 MG capsule Take 30 mg by mouth daily at 12 noon.  Marland Kitchen lisinopril (PRINIVIL,ZESTRIL) 2.5 MG tablet Take 1 tablet (2.5 mg total) by  mouth daily.  . meloxicam (MOBIC) 15 MG tablet Take 0.5 mg by mouth once a week.   . metoprolol succinate (TOPROL-XL) 25 MG 24 hr tablet Take 1 tablet (25 mg total) by mouth daily.  . ranitidine (ZANTAC) 150 MG tablet Take 150 mg by mouth at bedtime.   . simvastatin (ZOCOR) 40 MG tablet Take 1 tablet (40 mg total) by mouth daily at 6 PM.  . warfarin (COUMADIN) 5 MG tablet Take as directed by Coumadin clinic   No current facility-administered medications on file prior to visit.    Review of Systems Per HPI unless specifically indicated above     Objective:    BP 134/84 mmHg  Pulse 64  Temp(Src) 98 F (36.7 C) (Oral)  Ht 6' 0.5" (1.842 m)  Wt 231 lb 12 oz (105.121 kg)  BMI 30.98 kg/m2  Wt Readings from Last 3 Encounters:  06/13/15 231 lb 12 oz (105.121 kg)  01/02/15 230 lb 8 oz (104.554 kg)    Physical Exam  Constitutional: He appears well-developed and well-nourished. No distress.  HENT:  Mouth/Throat: Oropharynx is clear and moist. No oropharyngeal exudate.  Cardiovascular: Normal rate, regular rhythm, normal heart sounds and intact distal pulses.   No murmur heard. Pulmonary/Chest: Effort normal and breath sounds normal. No respiratory distress. He has no wheezes. He has no rales.  Musculoskeletal: He exhibits no edema.  Skin: Skin is warm and dry. No rash noted.  Psychiatric: He has a normal mood  and affect.  Nursing note and vitals reviewed.  Results for orders placed or performed in visit on 05/29/15  POCT INR  Result Value Ref Range   INR 3.2       Assessment & Plan:   Problem List Items Addressed This Visit    Left ventricular apical thrombus    Resolved after coumadin therapy. Followed by coumadin clinic.      Ischemic cardiomyopathy - Primary    Improved as of last echo. Continue current regimen. On coumadin and aspirin.      Coronary artery disease involving native coronary artery of native heart without angina pectoris    Followed by cards. asxs  currently.      Hyperlipidemia    Check FLP when he returns for medicare wellness visit in 3-4 mo. No results found for: [facility]       Essential hypertension    Chronic, stable. Continue current regimen.      Osteoarthritis    Possible upcoming replacement - awaiting cards eval. He does take sparing NSAID despite coumadin use.      History of radiation exposure       Follow up plan: Return in about 3 months (around 09/13/2015), or as needed, for medicare wellness.

## 2015-06-14 ENCOUNTER — Encounter: Payer: Self-pay | Admitting: Cardiovascular Disease

## 2015-06-14 ENCOUNTER — Ambulatory Visit (INDEPENDENT_AMBULATORY_CARE_PROVIDER_SITE_OTHER): Payer: Medicare Other | Admitting: Pharmacist

## 2015-06-14 ENCOUNTER — Ambulatory Visit (INDEPENDENT_AMBULATORY_CARE_PROVIDER_SITE_OTHER): Payer: Medicare Other | Admitting: Cardiovascular Disease

## 2015-06-14 VITALS — BP 132/82 | HR 64 | Ht 73.0 in | Wt 232.8 lb

## 2015-06-14 DIAGNOSIS — I2510 Atherosclerotic heart disease of native coronary artery without angina pectoris: Secondary | ICD-10-CM

## 2015-06-14 DIAGNOSIS — I10 Essential (primary) hypertension: Secondary | ICD-10-CM | POA: Diagnosis not present

## 2015-06-14 DIAGNOSIS — I513 Intracardiac thrombosis, not elsewhere classified: Secondary | ICD-10-CM

## 2015-06-14 DIAGNOSIS — E785 Hyperlipidemia, unspecified: Secondary | ICD-10-CM | POA: Diagnosis not present

## 2015-06-14 DIAGNOSIS — I255 Ischemic cardiomyopathy: Secondary | ICD-10-CM

## 2015-06-14 DIAGNOSIS — I213 ST elevation (STEMI) myocardial infarction of unspecified site: Secondary | ICD-10-CM

## 2015-06-14 DIAGNOSIS — I236 Thrombosis of atrium, auricular appendage, and ventricle as current complications following acute myocardial infarction: Secondary | ICD-10-CM

## 2015-06-14 LAB — POCT INR: INR: 2.5

## 2015-06-14 NOTE — Patient Instructions (Addendum)
You are doing well.  Please hold the aspirin  Please check the price of pradaxa (150 mg twice a day) and Xarelto (20 mg once a day)  We will schedule a CT coronary calcium score in 2 weeks  Please call us if you have new issues that need to be addressed before your next appt.  Your physician wants you to follow-up in: 6 months.  You will receive a reminder letter in the mail two months in advance. If you don't receive a letter, please call our office to schedule the follow-up appointment.

## 2015-06-14 NOTE — Assessment & Plan Note (Signed)
Appears euvolemic on today's visit No changes made to his medications We'll hold the aspirin, continue warfarin Long discussion concerning workup for coronary disease. CT coronary calcium scan ordered

## 2015-06-14 NOTE — Assessment & Plan Note (Signed)
Cholesterol is at goal on the current lipid regimen. No changes to the medications were made.  

## 2015-06-14 NOTE — Assessment & Plan Note (Signed)
Suggested he stay on his warfarin for now. We did discuss NOAC agents. He will check the price again Previously was expensive

## 2015-06-14 NOTE — Assessment & Plan Note (Signed)
He is concerned about underlying coronary disease. Recommended a coronary calcium score. Never had prior catheterization. EKG and PET scan consistent with old MI Recommended if he has symptoms of angina, would need urgent catheterization

## 2015-06-14 NOTE — Assessment & Plan Note (Signed)
Blood pressure is well controlled on today's visit. No changes made to the medications. 

## 2015-06-14 NOTE — Progress Notes (Signed)
Patient ID: Shannon Chung, male    DOB: 1941/07/24, 74 y.o.   MRN: [id number]  HPI Comments: Shannon Chung is a 74 year old male with coronary artery disease, ischemic cardiomyopathy, initial ejection fraction in July 2015 of 35%, cardiac PET scan showing scar in the apical and periapical region, mural thrombus seen in July 2015, started on anticoagulation, history of hyperlipidemia who presents for routine follow-up of his coronary artery disease  He reports that he feels well with no symptoms of angina. Limited by osteoarthritis of his left knee. Seen by orthopedics who has recommended total knee replacement at some point. Otherwise is active with no complaints  EKG on today's visit shows normal sinus rhythm with nonspecific ST abnormality, old anterior MI, unable to exclude old inferior MI  Other past medical history Previously noted to have abnormal EKG and then was referred to cardiology. They had talked about doing a cardiac catheterization but instead did a PET viability study.   Viability study showed scar in the periapical region, no hibernating myocardium, no ischemia, ejection fraction estimated at 45% Repeat echocardiogram November 2015 showing ejection fraction up to 45%, report suggesting no residual thrombus  No history of squamous cell carcinoma of the hypopharynx, status post radiation   total cholesterol 155, LDL 82 in January 2016 Total cholesterol previously 175 in July 2015    No Known Allergies  Outpatient Encounter Prescriptions as of 06/14/2015  Medication Sig  . acetaminophen (TYLENOL) 500 MG tablet Take 500 mg by mouth every 6 (six) hours as needed.  . fluticasone (FLONASE) 50 MCG/ACT nasal spray Place into both nostrils as needed.   . lansoprazole (PREVACID) 30 MG capsule Take 30 mg by mouth daily at 12 noon.  Marland Kitchen lisinopril (PRINIVIL,ZESTRIL) 2.5 MG tablet Take 1 tablet (2.5 mg total) by mouth daily.  . meloxicam (MOBIC) 15 MG tablet Take 0.5 mg by mouth  once a week.   . metoprolol succinate (TOPROL-XL) 25 MG 24 hr tablet Take 1 tablet (25 mg total) by mouth daily.  . ranitidine (ZANTAC) 150 MG tablet Take 150 mg by mouth at bedtime.   . simvastatin (ZOCOR) 40 MG tablet Take 1 tablet (40 mg total) by mouth daily at 6 PM.  . warfarin (COUMADIN) 5 MG tablet Take as directed by Coumadin clinic  . [DISCONTINUED] aspirin 81 MG tablet Take 81 mg by mouth daily.   No facility-administered encounter medications on file as of 06/14/2015.    Past Medical History  Diagnosis Date  . Skin cancer     squamous and basal, sees derm regularly  . Squamous cell carcinoma of vocal cord 2008  . Mural thrombus of cardiac apex history    resolved with coumadin  . Ischemic cardiomyopathy   . History of radiation exposure     right vocal cord squamous cell cancer  . Left ventricular apical thrombus   . Osteoarthritis     shoulder, knee ([facility])  . HTN (hypertension)   . Dyslipidemia   . 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 Tonsillectomy  1949  . Biceps tendon repair  1993  . Knee arthroscopy Left remote    Social History  reports that he quit smoking about 36 years ago. His smoking use included Cigarettes. He has a 5 pack-year smoking history. He has never used smokeless tobacco. He reports that he drinks alcohol. He reports that he does not use illicit drugs.  Family  History family history includes Alcoholism in his father; CAD (age of onset: 41) in his father; Hyperlipidemia in his father; Hypertension in his father.   Review of Systems  Constitutional: Negative.   Respiratory: Negative.   Cardiovascular: Negative.   Gastrointestinal: Negative.   Musculoskeletal: Positive for arthralgias.  Skin: Negative.   Neurological: Negative.   Hematological: Negative.   Psychiatric/Behavioral: Negative.   All other systems reviewed and are negative.   BP 132/82 mmHg   Pulse 64  Ht 6\' 1"  (1.854 m)  Wt 232 lb 12 oz (105.575 kg)  BMI 30.71 kg/m2   Physical Exam  Constitutional: He is oriented to person, place, and time. He appears well-developed and well-nourished.  HENT:  Head: Normocephalic.  Nose: Nose normal.  Mouth/Throat: Oropharynx is clear and moist.  Eyes: Conjunctivae are normal. Pupils are equal, round, and reactive to light.  Neck: Normal range of motion. Neck supple. No JVD present.  Cardiovascular: Normal rate, regular rhythm, S1 normal, S2 normal, normal heart sounds and intact distal pulses.  Exam reveals no gallop and no friction rub.   No murmur heard. Pulmonary/Chest: Effort normal and breath sounds normal. No respiratory distress. He has no wheezes. He has no rales. He exhibits no tenderness.  Abdominal: Soft. Bowel sounds are normal. He exhibits no distension. There is no tenderness.  Musculoskeletal: Normal range of motion. He exhibits no edema or tenderness.  Lymphadenopathy:    He has no cervical adenopathy.  Neurological: He is alert and oriented to person, place, and time. Coordination normal.  Skin: Skin is warm and dry. No rash noted. No erythema.  Psychiatric: He has a normal mood and affect. His behavior is normal. Judgment and thought content normal.      Assessment and Plan   Nursing note and vitals reviewed.

## 2015-06-19 ENCOUNTER — Encounter: Payer: Self-pay | Admitting: *Deleted

## 2015-06-24 DIAGNOSIS — H9319 Tinnitus, unspecified ear: Secondary | ICD-10-CM | POA: Diagnosis not present

## 2015-06-24 DIAGNOSIS — K219 Gastro-esophageal reflux disease without esophagitis: Secondary | ICD-10-CM | POA: Diagnosis not present

## 2015-06-24 DIAGNOSIS — J301 Allergic rhinitis due to pollen: Secondary | ICD-10-CM | POA: Diagnosis not present

## 2015-06-24 DIAGNOSIS — R1310 Dysphagia, unspecified: Secondary | ICD-10-CM | POA: Diagnosis not present

## 2015-06-24 DIAGNOSIS — H6123 Impacted cerumen, bilateral: Secondary | ICD-10-CM | POA: Diagnosis not present

## 2015-06-26 ENCOUNTER — Ambulatory Visit (INDEPENDENT_AMBULATORY_CARE_PROVIDER_SITE_OTHER)
Admission: RE | Admit: 2015-06-26 | Discharge: 2015-06-26 | Disposition: A | Discharge status: Home / Self Care | Payer: Medicare Other | Source: Ambulatory Visit | Attending: Cardiovascular Disease | Admitting: Cardiovascular Disease

## 2015-06-26 DIAGNOSIS — I2510 Atherosclerotic heart disease of native coronary artery without angina pectoris: Secondary | ICD-10-CM

## 2015-06-27 ENCOUNTER — Telehealth: Payer: Self-pay

## 2015-06-27 NOTE — Telephone Encounter (Signed)
Pt would like CT scan results. Please call.

## 2015-06-28 DIAGNOSIS — H2513 Age-related nuclear cataract, bilateral: Secondary | ICD-10-CM | POA: Diagnosis not present

## 2015-06-28 NOTE — Telephone Encounter (Signed)
Pt called back wanting CT results, states he needs to know the results before his appt on Monday with the orthopaedic dr. Quintella Baton he is to have a knee replacement. Please call.

## 2015-06-28 NOTE — Telephone Encounter (Signed)
See note below

## 2015-06-29 NOTE — Telephone Encounter (Signed)
See result note.  

## 2015-07-01 ENCOUNTER — Other Ambulatory Visit: Payer: Self-pay

## 2015-07-01 ENCOUNTER — Telehealth: Payer: Self-pay | Admitting: *Deleted

## 2015-07-01 DIAGNOSIS — M1712 Unilateral primary osteoarthritis, left knee: Secondary | ICD-10-CM | POA: Diagnosis not present

## 2015-07-01 DIAGNOSIS — Z01818 Encounter for other preprocedural examination: Secondary | ICD-10-CM

## 2015-07-01 DIAGNOSIS — R079 Chest pain, unspecified: Secondary | ICD-10-CM

## 2015-07-01 NOTE — Telephone Encounter (Signed)
Please call patient with the results of the CT cardiac calcium test.

## 2015-07-02 ENCOUNTER — Ambulatory Visit
Admission: RE | Admit: 2015-07-02 | Discharge: 2015-07-02 | Disposition: A | Discharge status: Home / Self Care | Payer: Medicare Other | Source: Ambulatory Visit | Attending: Cardiovascular Disease | Admitting: Cardiovascular Disease

## 2015-07-02 DIAGNOSIS — R079 Chest pain, unspecified: Secondary | ICD-10-CM | POA: Insufficient documentation

## 2015-07-02 DIAGNOSIS — Z01818 Encounter for other preprocedural examination: Secondary | ICD-10-CM

## 2015-07-02 DIAGNOSIS — Z01810 Encounter for preprocedural cardiovascular examination: Secondary | ICD-10-CM | POA: Diagnosis not present

## 2015-07-02 DIAGNOSIS — R911 Solitary pulmonary nodule: Secondary | ICD-10-CM | POA: Diagnosis not present

## 2015-07-02 LAB — CBC WITH DIFFERENTIAL/PLATELET
BASOS ABS: 0 10*3/uL (ref 0–0.1)
Basophils Relative: 0 %
EOS PCT: 0 %
Eosinophils Absolute: 0 10*3/uL (ref 0–0.7)
HEMATOCRIT: 41.9 % (ref 40.0–52.0)
Hemoglobin: 13.9 g/dL (ref 13.0–18.0)
LYMPHS ABS: 1.5 10*3/uL (ref 1.0–3.6)
LYMPHS PCT: 12 %
MCH: 31.4 pg (ref 26.0–34.0)
MCHC: 33.1 g/dL (ref 32.0–36.0)
MCV: 95 fL (ref 80.0–100.0)
MONO ABS: 0.6 10*3/uL (ref 0.2–1.0)
Monocytes Relative: 5 %
NEUTROS ABS: 10 10*3/uL — AB (ref 1.4–6.5)
Neutrophils Relative %: 83 %
PLATELETS: 154 10*3/uL (ref 150–440)
RBC: 4.41 MIL/uL (ref 4.40–5.90)
RDW: 14.2 % (ref 11.5–14.5)
WBC: 12.1 10*3/uL — AB (ref 3.8–10.6)

## 2015-07-02 LAB — BASIC METABOLIC PANEL
ANION GAP: 13 (ref 5–15)
BUN: 15 mg/dL (ref 6–20)
CO2: 20 mmol/L — AB (ref 22–32)
Calcium: 9.5 mg/dL (ref 8.9–10.3)
Chloride: 105 mmol/L (ref 101–111)
Creatinine, Ser: 1 mg/dL (ref 0.61–1.24)
GFR calc Af Amer: >60 mL/min (ref >60)
GLUCOSE: 267 mg/dL — AB (ref 65–99)
POTASSIUM: 4 mmol/L (ref 3.5–5.1)
Sodium: 138 mmol/L (ref 135–145)

## 2015-07-02 LAB — PROTIME-INR
INR: 2.4
Prothrombin Time: 26.3 seconds — ABNORMAL HIGH (ref 11.4–15.0)

## 2015-07-02 NOTE — Telephone Encounter (Signed)
Patient wants to clarify Cath instructions  1.  Should he prep groin- (told him specials procedures would do this prior to cath)  2.  Which meds not to take.  Told not to take 1 of meds but has question about taking 2nd bp med    [place]

## 2015-07-02 NOTE — Telephone Encounter (Signed)
Instructions were in the envelope that he picked up this am.  Will call pt on Thursday to go over instructions again.

## 2015-07-03 ENCOUNTER — Telehealth: Payer: Self-pay | Admitting: *Deleted

## 2015-07-03 NOTE — Telephone Encounter (Signed)
Spoke w/ pt.  Clarified to pt to only hold his lisinopril as directed.  He is appreciative and has no other questions at this time.

## 2015-07-03 NOTE — Telephone Encounter (Signed)
Pt calling stating he has question about cath on Friday Pt takes 2 bp meds And only one is on the do not take list which is Lisinopril  So the question is he to take Metoprolol and just not take the Lisinopril  Please call pt once we know.

## 2015-07-04 ENCOUNTER — Other Ambulatory Visit: Payer: Self-pay | Admitting: Cardiovascular Disease

## 2015-07-04 DIAGNOSIS — I209 Angina pectoris, unspecified: Secondary | ICD-10-CM

## 2015-07-04 DIAGNOSIS — E119 Type 2 diabetes mellitus without complications: Secondary | ICD-10-CM

## 2015-07-04 HISTORY — DX: Type 2 diabetes mellitus without complications: E11.9

## 2015-07-05 ENCOUNTER — Encounter
Admission: RE | Disposition: A | Discharge status: Home / Self Care | Payer: Self-pay | Source: Ambulatory Visit | Attending: Cardiovascular Disease

## 2015-07-05 ENCOUNTER — Encounter: Payer: Self-pay | Admitting: *Deleted

## 2015-07-05 ENCOUNTER — Telehealth: Payer: Self-pay

## 2015-07-05 ENCOUNTER — Ambulatory Visit
Admission: RE | Admit: 2015-07-05 | Discharge: 2015-07-05 | Disposition: A | Discharge status: Home / Self Care | Payer: Medicare Other | Source: Ambulatory Visit | Attending: Cardiovascular Disease | Admitting: Cardiovascular Disease

## 2015-07-05 DIAGNOSIS — Z79899 Other long term (current) drug therapy: Secondary | ICD-10-CM | POA: Diagnosis not present

## 2015-07-05 DIAGNOSIS — Z811 Family history of alcohol abuse and dependence: Secondary | ICD-10-CM | POA: Diagnosis not present

## 2015-07-05 DIAGNOSIS — Z923 Personal history of irradiation: Secondary | ICD-10-CM | POA: Diagnosis not present

## 2015-07-05 DIAGNOSIS — Z8249 Family history of ischemic heart disease and other diseases of the circulatory system: Secondary | ICD-10-CM | POA: Diagnosis not present

## 2015-07-05 DIAGNOSIS — I2510 Atherosclerotic heart disease of native coronary artery without angina pectoris: Secondary | ICD-10-CM | POA: Insufficient documentation

## 2015-07-05 DIAGNOSIS — I10 Essential (primary) hypertension: Secondary | ICD-10-CM | POA: Insufficient documentation

## 2015-07-05 DIAGNOSIS — R9431 Abnormal electrocardiogram [ECG] [EKG]: Secondary | ICD-10-CM | POA: Insufficient documentation

## 2015-07-05 DIAGNOSIS — E785 Hyperlipidemia, unspecified: Secondary | ICD-10-CM | POA: Diagnosis not present

## 2015-07-05 DIAGNOSIS — I259 Chronic ischemic heart disease, unspecified: Secondary | ICD-10-CM | POA: Diagnosis not present

## 2015-07-05 DIAGNOSIS — I2582 Chronic total occlusion of coronary artery: Secondary | ICD-10-CM | POA: Insufficient documentation

## 2015-07-05 DIAGNOSIS — M1712 Unilateral primary osteoarthritis, left knee: Secondary | ICD-10-CM | POA: Diagnosis not present

## 2015-07-05 DIAGNOSIS — I252 Old myocardial infarction: Secondary | ICD-10-CM | POA: Insufficient documentation

## 2015-07-05 DIAGNOSIS — I255 Ischemic cardiomyopathy: Secondary | ICD-10-CM | POA: Diagnosis not present

## 2015-07-05 DIAGNOSIS — Z7901 Long term (current) use of anticoagulants: Secondary | ICD-10-CM | POA: Insufficient documentation

## 2015-07-05 DIAGNOSIS — Z8521 Personal history of malignant neoplasm of larynx: Secondary | ICD-10-CM | POA: Diagnosis not present

## 2015-07-05 DIAGNOSIS — Z8489 Family history of other specified conditions: Secondary | ICD-10-CM | POA: Insufficient documentation

## 2015-07-05 DIAGNOSIS — Z87891 Personal history of nicotine dependence: Secondary | ICD-10-CM | POA: Insufficient documentation

## 2015-07-05 DIAGNOSIS — I209 Angina pectoris, unspecified: Secondary | ICD-10-CM

## 2015-07-05 HISTORY — PX: CARDIAC CATHETERIZATION: SHX172

## 2015-07-05 SURGERY — LEFT HEART CATH AND CORONARY ANGIOGRAPHY
Anesthesia: Moderate Sedation | Laterality: Bilateral

## 2015-07-05 SURGERY — LEFT HEART CATH
Anesthesia: Moderate Sedation

## 2015-07-05 MED ORDER — IOHEXOL 300 MG/ML  SOLN
INTRAMUSCULAR | Status: DC | PRN
  Administered 2015-07-05: 120 mL via INTRA_ARTERIAL

## 2015-07-05 MED ORDER — SODIUM CHLORIDE 0.9 % WEIGHT BASED INFUSION: 3.0000 mL/kg/h | INTRAVENOUS | Status: DC

## 2015-07-05 MED ORDER — FENTANYL CITRATE (PF) 100 MCG/2ML IJ SOLN
INTRAMUSCULAR | Status: AC
  Filled 2015-07-05: qty 2

## 2015-07-05 MED ORDER — SODIUM CHLORIDE 0.9 % IV SOLN
INTRAVENOUS | Status: DC
  Administered 2015-07-05: 09:00:00 via INTRAVENOUS

## 2015-07-05 MED ORDER — SODIUM CHLORIDE 0.9 % WEIGHT BASED INFUSION: 1.0000 mL/kg/h | INTRAVENOUS | Status: DC

## 2015-07-05 MED ORDER — ASPIRIN 81 MG PO CHEW: 81.0000 mg | CHEWABLE_TABLET | ORAL | Status: DC

## 2015-07-05 MED ORDER — FENTANYL CITRATE (PF) 100 MCG/2ML IJ SOLN
INTRAMUSCULAR | Status: DC | PRN
  Administered 2015-07-05: 25 ug via INTRAVENOUS
  Administered 2015-07-05: 50 ug via INTRAVENOUS
  Administered 2015-07-05: 25 ug via INTRAVENOUS

## 2015-07-05 MED ORDER — ACETAMINOPHEN 325 MG PO TABS: 650.0000 mg | ORAL_TABLET | ORAL | Status: DC | PRN

## 2015-07-05 MED ORDER — MIDAZOLAM HCL 2 MG/2ML IJ SOLN
INTRAMUSCULAR | Status: AC
  Filled 2015-07-05: qty 2

## 2015-07-05 MED ORDER — HEPARIN (PORCINE) IN NACL 2-0.9 UNIT/ML-% IJ SOLN
INTRAMUSCULAR | Status: AC
  Filled 2015-07-05: qty 1000

## 2015-07-05 MED ORDER — ONDANSETRON HCL 4 MG/2ML IJ SOLN: 4.0000 mg | Freq: Four times a day (QID) | INTRAMUSCULAR | Status: DC | PRN

## 2015-07-05 MED ORDER — MIDAZOLAM HCL 2 MG/2ML IJ SOLN
INTRAMUSCULAR | Status: DC | PRN
  Administered 2015-07-05 (×2): 1 mg via INTRAVENOUS

## 2015-07-05 SURGICAL SUPPLY — 11 items
CATH INFINITI 5FR ANG PIGTAIL (CATHETERS) ×2 IMPLANT
CATH INFINITI 5FR JL4 (CATHETERS) ×2 IMPLANT
CATH INFINITI 5FR JL5 (CATHETERS) ×2 IMPLANT
CATH INFINITI JR4 5F (CATHETERS) ×2 IMPLANT
DEVICE CLOSURE MYNXGRIP 5F (Vascular Products) ×2 IMPLANT
KIT MANI 3VAL PERCEP (MISCELLANEOUS) ×2 IMPLANT
NEEDLE PERC 18GX7CM (NEEDLE) ×2 IMPLANT
NEEDLE SMART 18G ACCESS (NEEDLE) IMPLANT
PACK CARDIAC CATH (CUSTOM PROCEDURE TRAY) ×2 IMPLANT
SHEATH AVANTI 5FR X 11CM (SHEATH) ×2 IMPLANT
WIRE EMERALD 3MM-J .035X150CM (WIRE) ×2 IMPLANT

## 2015-07-05 NOTE — Discharge Instructions (Signed)

## 2015-07-05 NOTE — Telephone Encounter (Signed)
Pt has been holding Coumadin since Monday in prep for cath today.  INR 1.3.

## 2015-07-07 ENCOUNTER — Encounter: Payer: Self-pay | Admitting: Family Medicine

## 2015-07-07 DIAGNOSIS — E559 Vitamin D deficiency, unspecified: Secondary | ICD-10-CM | POA: Insufficient documentation

## 2015-07-09 ENCOUNTER — Other Ambulatory Visit: Payer: Self-pay

## 2015-07-09 ENCOUNTER — Encounter: Payer: Self-pay | Admitting: Family Medicine

## 2015-07-09 ENCOUNTER — Telehealth: Payer: Self-pay

## 2015-07-09 DIAGNOSIS — R931 Abnormal findings on diagnostic imaging of heart and coronary circulation: Secondary | ICD-10-CM

## 2015-07-09 DIAGNOSIS — R0989 Other specified symptoms and signs involving the circulatory and respiratory systems: Secondary | ICD-10-CM

## 2015-07-09 NOTE — Telephone Encounter (Signed)
Spoke w/ pt.  He reports that he got the bandage off. Advised him that I have sent referral and that he should hear from [facility] in a few days. Asked him to call back if he has not received a call by the end of the week.

## 2015-07-09 NOTE — Telephone Encounter (Signed)
Pt called, states he has some questions regarding his bandage from his cath on Friday, he has tried to remove it, but is not coming off. Also he has a question regarding scheduling his bypass surgery. Please call.

## 2015-07-17 ENCOUNTER — Encounter: Payer: Self-pay | Admitting: Surgery

## 2015-07-17 ENCOUNTER — Other Ambulatory Visit: Payer: Self-pay | Admitting: *Deleted

## 2015-07-17 ENCOUNTER — Institutional Professional Consult (permissible substitution) (INDEPENDENT_AMBULATORY_CARE_PROVIDER_SITE_OTHER): Payer: Medicare Other | Admitting: Surgery

## 2015-07-17 VITALS — BP 140/86 | HR 60 | Resp 20 | Ht 73.0 in | Wt 228.0 lb

## 2015-07-17 DIAGNOSIS — I2510 Atherosclerotic heart disease of native coronary artery without angina pectoris: Secondary | ICD-10-CM | POA: Diagnosis not present

## 2015-07-18 ENCOUNTER — Encounter: Payer: Self-pay | Admitting: Cardiovascular Disease

## 2015-07-18 ENCOUNTER — Other Ambulatory Visit: Payer: Self-pay | Admitting: Cardiovascular Disease

## 2015-07-18 NOTE — Telephone Encounter (Signed)
Please review for refill, Thank you. 

## 2015-07-19 ENCOUNTER — Encounter: Payer: Self-pay | Admitting: Surgery

## 2015-07-19 NOTE — Progress Notes (Signed)
Cardiothoracic Surgery Consultation   PCP is Ria Bush, MD Referring Provider is Minna Merritts, MD  Chief Complaint  Patient presents with  . Coronary Artery Disease    Surgical eval for possible CABG, Cardiac Cath 07/05/15, no recent ECHO     HPI:  The patient is a 74 year old gentleman with hypertension, and dyslipidemia who lived in [place] until this year. He says that he was noted by his previous physician in [place] to have an abnormal ECG but was asymptomatic and therefore underwent a PET viability study in July 2015 that showed scar in the periapical region with no ischemia and an EF of 45%. There was mural thrombus and he was treated with coumdin. A follow up echo in Nov 2015 showed an EF of 45% with no residual thrombus. He was seen by Dr. Rockey Situ in March 2016 to establish care and was asymptomatic. He was continued on medical therapy. He has severe DJD of both knees and needs to have a knee replacement. He underwent a CT cardiac scoring which was more than 1100 and therefore a cath was performed on 07/05/2015 showing severe multi-vessel CAD with an occluded LAD, occluded RCA and 60-70% mid to distal LM stenosis. LVEF was moderately depressed with akinesis of the apical anterior, apical and apical inferior walls. He continues to deny any symptoms of chest pain or shortness of breath but says he does get tired at the end of the day and can take a nap any afternoon.  Past Medical History  Diagnosis Date  . Skin cancer     squamous and basal, sees derm regularly  . 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 Ischemic cardiomyopathy     dilated, EF 35% improved to 45-50% (2015)  . History of radiation exposure     right vocal cord squamous cell cancer  . Osteoarthritis     shoulder, knee ([facility])  . HTN (hypertension)   . Dyslipidemia   . Lone atrial fibrillation 1983    isolated  episode  . Vitamin D deficiency     Past Surgical History  Procedure Laterality Date  . Tonsillectomy  1949  . Biceps tendon repair  1993  . Knee arthroscopy Left remote  . Cardiac catheterization N/A 07/05/2015    Procedure: Left Heart Cath and Coronary Angiography;  Surgeon: Minna Merritts, MD;  Location: [facility];  Service: Cardiovascular;  Laterality: N/A;  . Colonoscopy  2007    Family History  Problem Relation Age of Onset  . CAD Father 44    MI  . Hypertension Father   . Hyperlipidemia Father   . Alcoholism Father   . Diabetes Father     Social History Social History  Substance Use Topics  . Smoking status: Former Smoker -- 0.50 packs/day for 10 years    Types: Cigarettes    Quit date: 11/02/1978  . Smokeless tobacco: Never Used  . Alcohol Use: 0.0 oz/week    0 Standard drinks or equivalent per week     Comment: beer/wine on weekends    Current Outpatient Prescriptions  Medication Sig Dispense Refill  . acetaminophen (TYLENOL) 500 MG tablet Take 500 mg by mouth every 6 (six) hours as needed.    . fluticasone (FLONASE) 50 MCG/ACT nasal spray Place into both nostrils as needed.     . lansoprazole (PREVACID) 30  MG capsule Take 30 mg by mouth daily at 12 noon.    Marland Kitchen lisinopril (PRINIVIL,ZESTRIL) 2.5 MG tablet Take 1 tablet (2.5 mg total) by mouth daily. 90 tablet 3  . meloxicam (MOBIC) 15 MG tablet Take 0.5 mg by mouth once a week.   1  . metoprolol succinate (TOPROL-XL) 25 MG 24 hr tablet Take 1 tablet (25 mg total) by mouth daily. 90 tablet 3  . ranitidine (ZANTAC) 150 MG tablet Take 150 mg by mouth at bedtime.   1  . simvastatin (ZOCOR) 40 MG tablet Take 1 tablet (40 mg total) by mouth daily at 6 PM. 90 tablet 3  . warfarin (COUMADIN) 5 MG tablet TAKE AS DIRECTED BY COUMADIN CLINIC 35 tablet 0   No current facility-administered medications for this visit.    No Known Allergies  Review of Systems  Constitutional: Positive for fatigue. Negative for  fever, activity change, appetite change and unexpected weight change.  HENT: Negative.   Eyes: Negative.   Respiratory: Negative.  Negative for shortness of breath.   Cardiovascular: Negative for chest pain, palpitations and leg swelling.  Gastrointestinal: Negative.   Endocrine: Negative.   Genitourinary: Negative.   Musculoskeletal: Positive for arthralgias.       Needs to have both knees replaced  Skin: Negative.   Allergic/Immunologic: Negative.   Neurological: Negative.   Hematological: Negative.   Psychiatric/Behavioral: Negative.     BP 140/86 mmHg  Pulse 60  Resp 20  Ht 6\' 1"  (1.854 m)  Wt 228 lb (103.42 kg)  BMI 30.09 kg/m2  SpO2 97% Physical Exam  Constitutional: He is oriented to person, place, and time. He appears well-developed and well-nourished. No distress.  HENT:  Head: Normocephalic and atraumatic.  Mouth/Throat: Oropharynx is clear and moist.  Eyes: EOM are normal. Pupils are equal, round, and reactive to light.  Neck: Normal range of motion. Neck supple. No JVD present. No thyromegaly present.  Cardiovascular: Normal rate, regular rhythm, normal heart sounds and intact distal pulses.   No murmur heard. Pulmonary/Chest: Effort normal and breath sounds normal. No respiratory distress. He has no wheezes. He exhibits no tenderness.  Abdominal: Soft. Bowel sounds are normal. He exhibits no distension and no mass. There is no tenderness.  Musculoskeletal: Normal range of motion. He exhibits no edema.  Valgus deformity of both knees  Lymphadenopathy:    He has no cervical adenopathy.  Neurological: He is alert and oriented to person, place, and time. He has normal strength. No cranial nerve deficit or sensory deficit.  Skin: Skin is warm and dry.  Psychiatric: He has a normal mood and affect.     Diagnostic Tests:  Cardiac Catheterization Procedure Note  Name: Shannon Chung MRN: [id number] DOB: 02/28/1941  Procedure: Left Heart Cath, Selective  Coronary Angiography, LV angiography  Indication:  74 year old male with coronary artery disease, ischemic cardiomyopathy, initial ejection fraction in July 2015 of 35%, cardiac PET scan at outside facility showing scar in the apical and periapical region, mural thrombus seen in July 2015, started on anticoagulation, history of hyperlipidemia  With severe osteoarthritis of his left knee.  EKG with normal sinus rhythm with nonspecific ST abnormality, old anterior MI, old inferior MI  noted to have abnormal EKG  Viability study showed scar in the periapical region, no hibernating myocardium, no ischemia, ejection fraction estimated at 45% Repeat echocardiogram November 2015 showing ejection fraction up to 45%, report suggesting no residual thrombus --He had not been told in the past that he had  coronary artery disease. CT coronary calcium scoring done through our office which was more than 1100. Given the severity of the disease, depressed ejection fraction, he was scheduled for cardiac catheterization.  Procedural details: The right groin was prepped, draped, and anesthetized with 1% lidocaine. Using modified Seldinger technique, a 5 French sheath was introduced into the right femoral artery. Standard Judkins catheters were used for coronary angiography and left ventriculography. Catheter exchanges were performed over a guidewire. There were no immediate procedural complications. The patient was transferred to the post catheterization recovery area for further monitoring.  Procedural Findings:  Coronary angiography:  Coronary dominance: Right or codominant  Left mainstem: Large vessel with moderate to severe mid to distal disease estimated at 60-70%, bifurcates into the LAD, ramus, circumflex. Also with high OM noted  Left anterior descending (LAD): Large branch proximally with severe proximal disease estimated at 90%, also with 100% occlusion/CTO of the proximal to mid LAD after a large  diagonal. The large diagonal has critical proximal disease estimated at 95%. There are left to left collaterals to the mid and distal LAD.   Ramus: Large branching ramus vessel. More anterior branch/possibly high diagonal vessel is moderate to large in size with severe proximal disease. Other ramus branch closest to the ostium of the circumflex takeoff is large in size also with proximal disease, suspected severe ostial disease at the takeoff at the branch vessel.  Left circumflex (LCx): Large vessel with moderate distal disease diffusely. Collaterals from the distal circumflex to the distal RCA  Right coronary artery (RCA): RCA is occluded in the proximal region, CTO. Small marginal branches extending to the left. Collaterals from distal left coronary system to the distal RCA  Left ventriculography: Left ventricular systolic function is normal, LVEF is estimated at 55-65%, there is no significant mitral regurgitation   Final Conclusions:  Right or codominant coronary system Severe multivessel disease including occluded LAD, occluded RCA, severe disease of a ramus branch, moderate diffuse disease of the distal circumflex. Of note is the moderate to severe mid to distal left main disease. Severely depressed ejection fraction with akinesis or severe hypokinesis of the apical, periapical, inferior wall  Recommendations:  Case discussed with Dr. Fletcher Anon.  Given the severity of the disease, multiple vessels, left main disease, he will be referred for consideration of CABG.   Ida Rogue 07/05/2015, 12:24 PM  Estimated blood loss <50 mL. There were no immediate complications during the procedure.    Conclusion     LM lesion, 70% stenosed.  Mid LAD-1 lesion, 100% stenosed.  Mid LAD-2 lesion, 90% stenosed.  2nd Diag lesion, 95% stenosed.  Prox LAD lesion, 80% stenosed.  Ramus lesion, 75% stenosed.  1st Diag lesion, 70% stenosed.  Lat 1st Diag lesion, 70% stenosed.  Prox RCA to  Mid RCA lesion, 100% stenosed.  There is moderate left ventricular systolic dysfunction.     Coronary Findings    Dominance: Left   Left Main   . LM lesion, 70% stenosed.     Left Anterior Descending  The vessel is small .   Marland Kitchen Prox LAD lesion, 80% stenosed.   . Mid LAD-1 lesion, 100% stenosed. chronic total occlusion .   Marland Kitchen Mid LAD-2 lesion, 90% stenosed.   . First Diagonal Branch   . 1st Diag lesion, 70% stenosed.   . Lateral First Diagonal Branch   . Lat 1st Diag lesion, 70% stenosed.   . Second Diagonal Branch   . 2nd Diag lesion, 95% stenosed.   Marland Kitchen  Third Septal Branch   3rd Sept filled by collaterals from [place].     Ramus Intermedius   . Ramus lesion, 75% stenosed.     Left Circumflex  The vessel is small .   Marland Kitchen Second Obtuse Marginal Branch   The vessel is small in size.   Marland Kitchen Left Atrioventricular Groove Continuation   The vessel is small in size.     Right Coronary Artery  The vessel is small . Mid RCA filled by collaterals from Dist LAD.   Marland Kitchen Prox RCA to Mid RCA lesion, 100% stenosed. chronic total occlusion .      Wall Motion                 Left Heart    Left Ventricle The left ventricular size is normal. There is moderate left ventricular systolic dysfunction. The left ventricular ejection fraction is 25-35% by visual estimate.   Aortic Valve There is no aortic valve stenosis. There is normal aortic valve motion.    Coronary Diagrams    Diagnostic Diagram            Hemo Data    AO Systolic Cath Pressure AO Diastolic Cath Pressure AO Mean Cath Pressure LV Systolic Cath Pressure LV End Diastolic   254 81 mmHg 270 mmHg -- --   -- -- -- 142 mmHg 15 mmHg   -- -- -- 32767 mmHg 32767 mmHg   -- -- -- 153 mmHg 16 mmHg   152 77 mmHg 104 mmHg -- --    Implants    Name ID Temporary Type Supply   DEVICE CLOSURE MYNXGRIP 47F - [id number] No Vascular Products DEVICE CLOSURE MYNXGRIP 47F    Order-Level Documents:     There are no order-level documents.    Encounter-Level Documents - 07/01/15:      Scan on 07/12/2015 8:45 AM by Provider Default, MDScan on 07/12/2015 8:45 AM by Provider Default, MD     Scan on 07/09/2015 12:02 PM by Provider Default, MDScan on 07/09/2015 12:02 PM by Provider Default, MD     Scan on 07/09/2015 11:51 AM by Provider Default, MDScan on 07/09/2015 11:51 AM by Provider Default, MD     Scan on 07/05/2015 10:57 AM by Provider Default, MDScan on 07/05/2015 10:57 AM by Provider Default, MD     Electronic signature on 07/05/2015 8:49 AM    Signed    Electronically signed by Minna Merritts, MD on 07/05/15 at 1236 EDT    Impression:  I have personally reviewed his cardiac cath films and his echo reports from [place]. He had significant left main and severe multi-vessel coronary artery disease with an occluded LAD and RCA, moderately depressed EF due to prior MI's that have occurred with no symptoms noticed by the patient. I agree that CABG is indicated for preservation of myocardium especially with his degree of disease and never having any noticeable symptoms. He will be able to undergo knee replacement after he recovers from this surgery. I discussed the operative procedure with the patient and family including alternatives, benefits and risks; including but not limited to bleeding, blood transfusion, infection, stroke, myocardial infarction, graft failure, heart block requiring a permanent pacemaker, organ dysfunction, and death.  Shannon Chung understands and agrees to proceed.    Plan:  We will schedule surgery for Monday 07/29/2015.  [name], MD [facility] [phone]

## 2015-07-22 ENCOUNTER — Encounter: Payer: Self-pay | Admitting: Cardiovascular Disease

## 2015-07-25 ENCOUNTER — Encounter (HOSPITAL_COMMUNITY)
Admission: RE | Admit: 2015-07-25 | Discharge: 2015-07-25 | Disposition: A | Discharge status: Home / Self Care | Payer: Medicare Other | Source: Ambulatory Visit | Attending: Surgery | Admitting: Surgery

## 2015-07-25 ENCOUNTER — Ambulatory Visit (HOSPITAL_COMMUNITY)
Admission: RE | Admit: 2015-07-25 | Discharge: 2015-07-25 | Disposition: A | Discharge status: Home / Self Care | Payer: Medicare Other | Source: Ambulatory Visit | Attending: Surgery | Admitting: Surgery

## 2015-07-25 ENCOUNTER — Encounter (HOSPITAL_COMMUNITY): Payer: Self-pay

## 2015-07-25 VITALS — BP 148/93 | HR 67 | Temp 98.1°F | Resp 20 | Ht 73.0 in | Wt 233.5 lb

## 2015-07-25 DIAGNOSIS — I10 Essential (primary) hypertension: Secondary | ICD-10-CM | POA: Insufficient documentation

## 2015-07-25 DIAGNOSIS — I255 Ischemic cardiomyopathy: Secondary | ICD-10-CM | POA: Insufficient documentation

## 2015-07-25 DIAGNOSIS — Z79899 Other long term (current) drug therapy: Secondary | ICD-10-CM | POA: Diagnosis not present

## 2015-07-25 DIAGNOSIS — Z01812 Encounter for preprocedural laboratory examination: Secondary | ICD-10-CM | POA: Insufficient documentation

## 2015-07-25 DIAGNOSIS — Z87891 Personal history of nicotine dependence: Secondary | ICD-10-CM | POA: Insufficient documentation

## 2015-07-25 DIAGNOSIS — Z01818 Encounter for other preprocedural examination: Secondary | ICD-10-CM | POA: Diagnosis not present

## 2015-07-25 DIAGNOSIS — Z0183 Encounter for blood typing: Secondary | ICD-10-CM | POA: Insufficient documentation

## 2015-07-25 DIAGNOSIS — I2510 Atherosclerotic heart disease of native coronary artery without angina pectoris: Secondary | ICD-10-CM

## 2015-07-25 DIAGNOSIS — I6523 Occlusion and stenosis of bilateral carotid arteries: Secondary | ICD-10-CM | POA: Insufficient documentation

## 2015-07-25 DIAGNOSIS — I252 Old myocardial infarction: Secondary | ICD-10-CM | POA: Insufficient documentation

## 2015-07-25 DIAGNOSIS — Z7901 Long term (current) use of anticoagulants: Secondary | ICD-10-CM | POA: Diagnosis not present

## 2015-07-25 DIAGNOSIS — E785 Hyperlipidemia, unspecified: Secondary | ICD-10-CM | POA: Diagnosis not present

## 2015-07-25 DIAGNOSIS — J984 Other disorders of lung: Secondary | ICD-10-CM | POA: Insufficient documentation

## 2015-07-25 DIAGNOSIS — K219 Gastro-esophageal reflux disease without esophagitis: Secondary | ICD-10-CM | POA: Diagnosis not present

## 2015-07-25 DIAGNOSIS — I700 Atherosclerosis of aorta: Secondary | ICD-10-CM | POA: Diagnosis not present

## 2015-07-25 DIAGNOSIS — R9431 Abnormal electrocardiogram [ECG] [EKG]: Secondary | ICD-10-CM | POA: Diagnosis not present

## 2015-07-25 HISTORY — DX: Gastro-esophageal reflux disease without esophagitis: K21.9

## 2015-07-25 HISTORY — DX: Atherosclerotic heart disease of native coronary artery without angina pectoris: I25.10

## 2015-07-25 LAB — COMPREHENSIVE METABOLIC PANEL
ALK PHOS: 41 U/L (ref 38–126)
ALT: 20 U/L (ref 17–63)
AST: 27 U/L (ref 15–41)
Albumin: 4 g/dL (ref 3.5–5.0)
Anion gap: 10 (ref 5–15)
BILIRUBIN TOTAL: 0.5 mg/dL (ref 0.3–1.2)
BUN: 11 mg/dL (ref 6–20)
CALCIUM: 9.3 mg/dL (ref 8.9–10.3)
CO2: 24 mmol/L (ref 22–32)
Chloride: 106 mmol/L (ref 101–111)
Creatinine, Ser: 0.87 mg/dL (ref 0.61–1.24)
GFR calc Af Amer: >60 mL/min (ref >60)
GLUCOSE: 174 mg/dL — AB (ref 65–99)
POTASSIUM: 4 mmol/L (ref 3.5–5.1)
Sodium: 140 mmol/L (ref 135–145)
TOTAL PROTEIN: 6.8 g/dL (ref 6.5–8.1)

## 2015-07-25 LAB — CBC
HEMATOCRIT: 41.9 % (ref 39.0–52.0)
HEMOGLOBIN: 13.5 g/dL (ref 13.0–17.0)
MCH: 31.4 pg (ref 26.0–34.0)
MCHC: 32.2 g/dL (ref 30.0–36.0)
MCV: 97.4 fL (ref 78.0–100.0)
Platelets: 145 10*3/uL — ABNORMAL LOW (ref 150–400)
RBC: 4.3 MIL/uL (ref 4.22–5.81)
RDW: 13.7 % (ref 11.5–15.5)
WBC: 4.9 10*3/uL (ref 4.0–10.5)

## 2015-07-25 LAB — PULMONARY FUNCTION TEST
DL/VA % PRED: 81 %
DL/VA: 3.89 ml/min/mmHg/L
DLCO COR: 25.17 ml/min/mmHg
DLCO UNC % PRED: 66 %
DLCO cor % pred: 69 %
DLCO unc: 24.36 ml/min/mmHg
FEF 25-75 PRE: 2.71 L/s
FEF 25-75 Post: 3.27 L/sec
FEF2575-%CHANGE-POST: 20 %
FEF2575-%PRED-POST: 122 %
FEF2575-%PRED-PRE: 101 %
FEV1-%Change-Post: 1 %
FEV1-%PRED-PRE: 87 %
FEV1-%Pred-Post: 89 %
FEV1-Post: 3.18 L
FEV1-Pre: 3.13 L
FEV1FVC-%CHANGE-POST: 4 %
FEV1FVC-%Pred-Pre: 108 %
FEV6-%CHANGE-POST: -2 %
FEV6-%PRED-PRE: 85 %
FEV6-%Pred-Post: 83 %
FEV6-PRE: 3.95 L
FEV6-Post: 3.84 L
FEV6FVC-%Change-Post: 0 %
FEV6FVC-%PRED-PRE: 106 %
FEV6FVC-%Pred-Post: 106 %
FVC-%Change-Post: -3 %
FVC-%PRED-POST: 78 %
FVC-%PRED-PRE: 81 %
FVC-POST: 3.84 L
FVC-PRE: 3.97 L
POST FEV1/FVC RATIO: 83 %
POST FEV6/FVC RATIO: 100 %
Pre FEV1/FVC ratio: 79 %
Pre FEV6/FVC Ratio: 100 %
RV % PRED: 96 %
RV: 2.55 L
TLC % pred: 91 %
TLC: 7.02 L

## 2015-07-25 LAB — URINALYSIS, ROUTINE W REFLEX MICROSCOPIC
Bilirubin Urine: NEGATIVE
Glucose, UA: 250 mg/dL — AB
Hgb urine dipstick: NEGATIVE
Ketones, ur: NEGATIVE mg/dL
LEUKOCYTES UA: NEGATIVE
NITRITE: NEGATIVE
PH: 7.5 (ref 5.0–8.0)
Protein, ur: NEGATIVE mg/dL
SPECIFIC GRAVITY, URINE: 1.024 (ref 1.005–1.030)
UROBILINOGEN UA: 1 mg/dL (ref 0.0–1.0)

## 2015-07-25 LAB — PROTIME-INR
INR: 2.41 — ABNORMAL HIGH (ref 0.00–1.49)
PROTHROMBIN TIME: 26 s — AB (ref 11.6–15.2)

## 2015-07-25 LAB — SURGICAL PCR SCREEN
MRSA, PCR: NEGATIVE
STAPHYLOCOCCUS AUREUS: NEGATIVE

## 2015-07-25 LAB — ABO/RH: ABO/RH(D): O POS

## 2015-07-25 LAB — APTT: aPTT: 36 seconds (ref 24–37)

## 2015-07-25 MED ORDER — ALBUTEROL SULFATE (2.5 MG/3ML) 0.083% IN NEBU
2.5000 mg | INHALATION_SOLUTION | Freq: Once | RESPIRATORY_TRACT | Status: AC
  Administered 2015-07-25: 2.5 mg via RESPIRATORY_TRACT

## 2015-07-25 NOTE — Progress Notes (Signed)
   07/25/15 1147  OBSTRUCTIVE SLEEP APNEA  Have you ever been diagnosed with sleep apnea through a sleep study? No  Do you snore loudly (loud enough to be heard through closed doors)?  0  Do you often feel tired, fatigued, or sleepy during the daytime (such as falling asleep during driving or talking to someone)? 1 (only since he has had increased medicine for heart )  Has anyone observed you stop breathing during your sleep? 0  Do you have, or are you being treated for high blood pressure? 1  BMI more than 35 kg/m2? 0  Age > 50 (1-yes) 1  Neck circumference greater than:Male 16 inches or larger, Male 17inches or larger? 1  Male Gender (Yes=1) 1  Obstructive Sleep Apnea Score 5  Score 5 or greater  Results sent to PCP

## 2015-07-25 NOTE — Progress Notes (Signed)
Pre-op Cardiac Surgery  Carotid Findings:  Findings suggest 1-39% internal carotid artery stenosis bilaterally. Vertebral arteries are patent with antegrade flow.  Upper Extremity Right Left  Brachial Pressures 137-Triphasic 127-Triphasic  Radial Waveforms Triphasic Triphasic  Ulnar Waveforms Triphasic Triphasic  Palmar Arch (Allen's Test) Signal obliterates with both radial and ulnar compression. Unable to assess due to recent arterial puncture.    07/25/2015 1:49 PM Maudry Mayhew, RVT, RDCS, RDMS

## 2015-07-25 NOTE — Pre-Procedure Instructions (Signed)
Shannon Chung  07/25/2015      [id number] [place], [place] [address] Phone: [phone] Fax: [phone]    Your procedure is scheduled on 07/29/2015.  Report to [facility] Admitting at 5:30 A.M.  Call this number if you have problems the morning of surgery:  [phone]   Remember:  Do not eat food or drink liquids after midnight. On Sunday   Take these medicines the morning of surgery with A SIP OF WATER: Metoprolol, Prevacid   Do not wear jewelry   Do not wear lotions, powders, or perfumes.  You may wear deodorant.   Do not shave 48 hours prior to surgery.  Men may shave face and neck.   Do not bring valuables to the hospital.   [facility] is not responsible for any belongings or valuables.  Contacts, dentures or bridgework may not be worn into surgery.  Leave your suitcase in the car.  After surgery it may be brought to your room.  For patients admitted to the hospital, discharge time will be determined by your treatment team.  Patients discharged the day of surgery will not be allowed to drive home.   Name and phone number of your driver:   . [name] Special instructions:  Special Instructions: [name] - Preparing for Surgery  Before surgery, you can play an important role.  Because skin is not sterile, your skin needs to be as free of germs as possible.  You can reduce the number of germs on you skin by washing with CHG (chlorahexidine gluconate) soap before surgery.  CHG is an antiseptic cleaner which kills germs and bonds with the skin to continue killing germs even after washing.  Please DO NOT use if you have an allergy to CHG or antibacterial soaps.  If your skin becomes reddened/irritated stop using the CHG and inform your nurse when you arrive at Short Stay.  Do not shave (including legs and underarms) for at least 48 hours prior to the first CHG shower.  You may shave  your face.  Please follow these instructions carefully:   1.  Shower with CHG Soap the night before surgery and the  morning of Surgery.  2.  If you choose to wash your hair, wash your hair first as usual with your  normal shampoo.  3.  After you shampoo, rinse your hair and body thoroughly to remove the  Shampoo.  4.  Use CHG as you would any other liquid soap.  You can apply chg directly to the skin and wash gently with scrungie or a clean washcloth.  5.  Apply the CHG Soap to your body ONLY FROM THE NECK DOWN.    Do not use on open wounds or open sores.  Avoid contact with your eyes, ears, mouth and genitals (private parts).  Wash genitals (private parts)   with your normal soap.  6.  Wash thoroughly, paying special attention to the area where your surgery will be performed.  7.  Thoroughly rinse your body with warm water from the neck down.  8.  DO NOT shower/wash with your normal soap after using and rinsing off   the CHG Soap.  9.  Pat yourself dry with a clean towel.            10.  Wear clean pajamas.            11 .  Place clean sheets  on your bed the night of your first shower and do not sleep with pets.  Day of Surgery  Do not apply any lotions/deodorants the morning of surgery.  Please wear clean clothes to the hospital/surgery center.  Please read over the following fact sheets that you were given. Pain Booklet, Coughing and Deep Breathing, Blood Transfusion Information, Open Heart Packet, MRSA Information and Surgical Site Infection Prevention

## 2015-07-26 LAB — BLOOD GAS, ARTERIAL
Acid-Base Excess: 0.9 mmol/L (ref 0.0–2.0)
BICARBONATE: 24.8 meq/L — AB (ref 20.0–24.0)
DRAWN BY: 421801
O2 Saturation: 96.8 %
PCO2 ART: 38.4 mmHg (ref 35.0–45.0)
PH ART: 7.426 (ref 7.350–7.450)
Patient temperature: 98.6
TCO2: 26 mmol/L (ref 0–100)
pO2, Arterial: 88.3 mmHg (ref 80.0–100.0)

## 2015-07-26 LAB — HEMOGLOBIN A1C
Hgb A1c MFr Bld: 7 % — ABNORMAL HIGH (ref 4.8–5.6)
MEAN PLASMA GLUCOSE: 154 mg/dL

## 2015-07-26 NOTE — Progress Notes (Signed)
Anesthesia Chart Review: Patient is a 74 year old male scheduled for CABG on 07/29/15 by Dr. Cyndia Bent.  History includes CAD, MI '15, ischemic CM, LV apex thrombus 05/2014 s/p warfarin (no thrombus mentioned in 09/2014 echo), isolated afib '83, former smoker, HTN, dyslipidemia, SCC vocal cord s/p radiation, GERD, skin cancer, tonsillectomy. PCP is Dr. Danise Mina. Cardiologist is Dr. Rockey Situ. Patient lived in [place] until last year.  Medsc include warfarin, Flonase, Prevacid, lisinopril, Toprol XL, Zantac, Zocor.  07/05/15 Cardiac cath:  LM lesion, 70% stenosed.  Mid LAD-1 lesion, 100% stenosed.  Mid LAD-2 lesion, 90% stenosed.  2nd Diag lesion, 95% stenosed.  Prox LAD lesion, 80% stenosed.  Ramus lesion, 75% stenosed.  1st Diag lesion, 70% stenosed.  Lat 1st Diag lesion, 70% stenosed.  Prox RCA to Mid RCA lesion, 100% stenosed.  There is moderate left ventricular systolic dysfunction. Left Ventricle The left ventricular size is normal. There is moderate left ventricular systolic dysfunction. The left ventricular ejection fraction is 25-35% by visual estimate.    Aortic Valve There is no aortic valve stenosis. There is normal aortic valve motion       09/17/14 Echo: Global systolic LV contractility is mildly decreased. Moderate LVH. LVEF 45-50%. Apical septal, apical inferior and apical cap akinesis. No evidence of apical thrombus with Definity. Trace TR/PR. Grade 1 diastolic dysfunction.    07/25/15 Carotid Findings: Findings suggest 1-39% internal carotid artery stenosis bilaterally. Vertebral arteries are patent with antegrade flow.   07/25/15 PFTs: FVC 3.97 (81%), FEV1 3.13 (87%), DLCOunc 24.36 (66%).   Preoperative EKG and CXR noted.   Preoperative labs noted. A1C 7.0. PLT 145K. PT 26, INR 2.41.  Warfarin on hold since 07/23/15. Needs a STAT repeat PT/INR on arrival. If results are acceptable and otherwise no acute changes then I would anticipate that he could proceed as  planned.  George Hugh [facility] Short Stay Center/Anesthesiology Phone [phone] 07/26/2015 9:45 AM

## 2015-07-28 MED ORDER — NITROGLYCERIN IN D5W 200-5 MCG/ML-% IV SOLN
2.0000 ug/min | INTRAVENOUS | Status: AC
  Administered 2015-07-29: 5 ug/min via INTRAVENOUS
  Filled 2015-07-28: qty 250

## 2015-07-28 MED ORDER — DEXTROSE 5 % IV SOLN
750.0000 mg | INTRAVENOUS | Status: DC
  Filled 2015-07-28: qty 750

## 2015-07-28 MED ORDER — CHLORHEXIDINE GLUCONATE 0.12 % MT SOLN
15.0000 mL | Freq: Once | OROMUCOSAL | Status: DC
Start: 2015-07-29 — End: 2015-07-29
  Filled 2015-07-28: qty 15

## 2015-07-28 MED ORDER — DEXTROSE 5 % IV SOLN
30.0000 ug/min | INTRAVENOUS | Status: DC
  Administered 2015-07-29: 20 ug/min via INTRAVENOUS
  Filled 2015-07-28: qty 2

## 2015-07-28 MED ORDER — SODIUM CHLORIDE 0.9 % IV SOLN
INTRAVENOUS | Status: AC
  Administered 2015-07-29: 10:00:00 via INTRAVENOUS
  Administered 2015-07-29: 69.8 mL/h via INTRAVENOUS
  Filled 2015-07-28: qty 40

## 2015-07-28 MED ORDER — SODIUM CHLORIDE 0.9 % IV SOLN
INTRAVENOUS | Status: DC
  Filled 2015-07-28: qty 30

## 2015-07-28 MED ORDER — CHLORHEXIDINE GLUCONATE 4 % EX LIQD: 30.0000 mL | CUTANEOUS | Status: DC

## 2015-07-28 MED ORDER — VANCOMYCIN HCL 10 G IV SOLR
1500.0000 mg | INTRAVENOUS | Status: AC
  Administered 2015-07-29: 1500 mg via INTRAVENOUS
  Filled 2015-07-28: qty 1500

## 2015-07-28 MED ORDER — POTASSIUM CHLORIDE 2 MEQ/ML IV SOLN
80.0000 meq | INTRAVENOUS | Status: DC
  Filled 2015-07-28: qty 40

## 2015-07-28 MED ORDER — SODIUM CHLORIDE 0.9 % IV SOLN
INTRAVENOUS | Status: AC
  Administered 2015-07-29: 1 [IU]/h via INTRAVENOUS
  Filled 2015-07-28: qty 2.5

## 2015-07-28 MED ORDER — DEXTROSE 5 % IV SOLN
1.5000 g | INTRAVENOUS | Status: AC
  Administered 2015-07-29: .75 g via INTRAVENOUS
  Administered 2015-07-29: 1.5 g via INTRAVENOUS
  Filled 2015-07-28 (×2): qty 1.5

## 2015-07-28 MED ORDER — PLASMA-LYTE 148 IV SOLN
INTRAVENOUS | Status: AC
  Administered 2015-07-29: 500 mL
  Filled 2015-07-28: qty 2.5

## 2015-07-28 MED ORDER — DOPAMINE-DEXTROSE 3.2-5 MG/ML-% IV SOLN
0.0000 ug/kg/min | INTRAVENOUS | Status: DC
  Filled 2015-07-28: qty 250

## 2015-07-28 MED ORDER — METOPROLOL TARTRATE 12.5 MG HALF TABLET: 12.5000 mg | ORAL_TABLET | Freq: Once | ORAL | Status: DC

## 2015-07-28 MED ORDER — MAGNESIUM SULFATE 50 % IJ SOLN
40.0000 meq | INTRAMUSCULAR | Status: DC
  Filled 2015-07-28: qty 10

## 2015-07-28 MED ORDER — EPINEPHRINE HCL 1 MG/ML IJ SOLN
0.0000 ug/min | INTRAVENOUS | Status: DC
  Filled 2015-07-28: qty 4

## 2015-07-28 MED ORDER — DEXMEDETOMIDINE HCL IN NACL 400 MCG/100ML IV SOLN
0.1000 ug/kg/h | INTRAVENOUS | Status: AC
  Administered 2015-07-29: 12:00:00 via INTRAVENOUS
  Administered 2015-07-29: .3 ug/kg/h via INTRAVENOUS
  Filled 2015-07-28: qty 100

## 2015-07-28 NOTE — H&P (Signed)
[address]       [place] [address]             [phone]      Cardiothoracic Surgery History and Physical    PCP is Ria Bush, MD Referring Provider is Minna Merritts, MD  Chief Complaint  Patient presents with  . Coronary Artery Disease    Surgical eval for possible CABG, Cardiac Cath 07/05/15, no recent ECHO     HPI:  The patient is a 74 year old gentleman with hypertension, and dyslipidemia who lived in [place] until this year. He says that he was noted by his previous physician in [place] to have an abnormal ECG but was asymptomatic and therefore underwent a PET viability study in July 2015 that showed scar in the periapical region with no ischemia and an EF of 45%. There was mural thrombus and he was treated with coumdin. A follow up echo in Nov 2015 showed an EF of 45% with no residual thrombus. He was seen by Dr. Rockey Situ in March 2016 to establish care and was asymptomatic. He was continued on medical therapy. He has severe DJD of both knees and needs to have a knee replacement. He underwent a CT cardiac scoring which was more than 1100 and therefore a cath was performed on 07/05/2015 showing severe multi-vessel CAD with an occluded LAD, occluded RCA and 60-70% mid to distal LM stenosis. LVEF was moderately depressed with akinesis of the apical anterior, apical and apical inferior walls. He continues to deny any symptoms of chest pain or shortness of breath but says he does get tired at the end of the day and can take a nap any afternoon.  Past Medical History  Diagnosis Date  . Skin cancer     squamous and basal, sees derm regularly  . 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 Ischemic cardiomyopathy     dilated, EF 35% improved to 45-50% (2015)  . History of radiation exposure     right vocal cord squamous cell  cancer  . Osteoarthritis     shoulder, knee ([facility])  . HTN (hypertension)   . Dyslipidemia   . 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 Vitamin D deficiency     Past Surgical History  Procedure Laterality Date  . Tonsillectomy  1949  . Biceps tendon repair  1993  . Knee arthroscopy Left remote  . Cardiac catheterization N/A 07/05/2015    Procedure: Left Heart Cath and Coronary Angiography; Surgeon: Minna Merritts, MD; Location: [facility]; Service: Cardiovascular; Laterality: N/A;  . Colonoscopy  2007    Family History  Problem Relation Age of Onset  . CAD Father 87    MI  . Hypertension Father   . Hyperlipidemia Father   . Alcoholism Father   . Diabetes Father     Social History Social History  Substance Use Topics  . Smoking status: Former Smoker -- 0.50 packs/day for 10 years    Types: Cigarettes    Quit date: 11/02/1978  . Smokeless tobacco: Never Used  . Alcohol Use: 0.0 oz/week    0 Standard drinks or equivalent per week     Comment: beer/wine on weekends    Current Outpatient Prescriptions  Medication Sig Dispense Refill  . acetaminophen (TYLENOL) 500 MG tablet Take  500 mg by mouth every 6 (six) hours as needed.    . fluticasone (FLONASE) 50 MCG/ACT nasal spray Place into both nostrils as needed.     . lansoprazole (PREVACID) 30 MG capsule Take 30 mg by mouth daily at 12 noon.    Marland Kitchen lisinopril (PRINIVIL,ZESTRIL) 2.5 MG tablet Take 1 tablet (2.5 mg total) by mouth daily. 90 tablet 3  . meloxicam (MOBIC) 15 MG tablet Take 0.5 mg by mouth once a week.   1  . metoprolol succinate (TOPROL-XL) 25 MG 24 hr tablet Take 1 tablet (25 mg total) by mouth daily. 90 tablet 3  . ranitidine (ZANTAC) 150 MG tablet Take 150 mg by mouth at bedtime.   1  . simvastatin (ZOCOR) 40 MG tablet Take 1  tablet (40 mg total) by mouth daily at 6 PM. 90 tablet 3  . warfarin (COUMADIN) 5 MG tablet TAKE AS DIRECTED BY COUMADIN CLINIC 35 tablet 0   No current facility-administered medications for this visit.    No Known Allergies  Review of Systems  Constitutional: Positive for fatigue. Negative for fever, activity change, appetite change and unexpected weight change.  HENT: Negative.  Eyes: Negative.  Respiratory: Negative. Negative for shortness of breath.  Cardiovascular: Negative for chest pain, palpitations and leg swelling.  Gastrointestinal: Negative.  Endocrine: Negative.  Genitourinary: Negative.  Musculoskeletal: Positive for arthralgias.   Needs to have both knees replaced  Skin: Negative.  Allergic/Immunologic: Negative.  Neurological: Negative.  Hematological: Negative.  Psychiatric/Behavioral: Negative.    BP 140/86 mmHg  Pulse 60  Resp 20  Ht 6\' 1"  (1.854 m)  Wt 228 lb (103.42 kg)  BMI 30.09 kg/m2  SpO2 97% Physical Exam  Constitutional: He is oriented to person, place, and time. He appears well-developed and well-nourished. No distress.  HENT:  Head: Normocephalic and atraumatic.  Mouth/Throat: Oropharynx is clear and moist.  Eyes: EOM are normal. Pupils are equal, round, and reactive to light.  Neck: Normal range of motion. Neck supple. No JVD present. No thyromegaly present.  Cardiovascular: Normal rate, regular rhythm, normal heart sounds and intact distal pulses.  No murmur heard. Pulmonary/Chest: Effort normal and breath sounds normal. No respiratory distress. He has no wheezes. He exhibits no tenderness.  Abdominal: Soft. Bowel sounds are normal. He exhibits no distension and no mass. There is no tenderness.  Musculoskeletal: Normal range of motion. He exhibits no edema.  Valgus deformity of both knees  Lymphadenopathy:   He has no cervical adenopathy.  Neurological: He is alert and oriented to person, place, and time.  He has normal strength. No cranial nerve deficit or sensory deficit.  Skin: Skin is warm and dry.  Psychiatric: He has a normal mood and affect.     Diagnostic Tests:  Cardiac Catheterization Procedure Note  Name: Javis Abboud MRN: [id number] DOB: [birth year]  Procedure: Left Heart Cath, Selective Coronary Angiography, LV angiography  Indication:  74 year old male with coronary artery disease, ischemic cardiomyopathy, initial ejection fraction in July 2015 of 35%, cardiac PET scan at outside facility showing scar in the apical and periapical region, mural thrombus seen in July 2015, started on anticoagulation, history of hyperlipidemia  With severe osteoarthritis of his left knee.  EKG with normal sinus rhythm with nonspecific ST abnormality, old anterior MI, old inferior MI  noted to have abnormal EKG  Viability study showed scar in the periapical region, no hibernating myocardium, no ischemia, ejection fraction estimated at 45% Repeat echocardiogram November 2015 showing ejection fraction up to 45%,  report suggesting no residual thrombus --He had not been told in the past that he had coronary artery disease. CT coronary calcium scoring done through our office which was more than 1100. Given the severity of the disease, depressed ejection fraction, he was scheduled for cardiac catheterization.  Procedural details: The right groin was prepped, draped, and anesthetized with 1% lidocaine. Using modified Seldinger technique, a 5 French sheath was introduced into the right femoral artery. Standard Judkins catheters were used for coronary angiography and left ventriculography. Catheter exchanges were performed over a guidewire. There were no immediate procedural complications. The patient was transferred to the post catheterization recovery area for further monitoring.  Procedural Findings:  Coronary angiography:  Coronary dominance: Right or codominant  Left mainstem: Large vessel  with moderate to severe mid to distal disease estimated at 60-70%, bifurcates into the LAD, ramus, circumflex. Also with high OM noted  Left anterior descending (LAD): Large branch proximally with severe proximal disease estimated at 90%, also with 100% occlusion/CTO of the proximal to mid LAD after a large diagonal. The large diagonal has critical proximal disease estimated at 95%. There are left to left collaterals to the mid and distal LAD.   Ramus: Large branching ramus vessel. More anterior branch/possibly high diagonal vessel is moderate to large in size with severe proximal disease. Other ramus branch closest to the ostium of the circumflex takeoff is large in size also with proximal disease, suspected severe ostial disease at the takeoff at the branch vessel.  Left circumflex (LCx): Large vessel with moderate distal disease diffusely. Collaterals from the distal circumflex to the distal RCA  Right coronary artery (RCA): RCA is occluded in the proximal region, CTO. Small marginal branches extending to the left. Collaterals from distal left coronary system to the distal RCA  Left ventriculography: Left ventricular systolic function is normal, LVEF is estimated at 55-65%, there is no significant mitral regurgitation   Final Conclusions:  Right or codominant coronary system Severe multivessel disease including occluded LAD, occluded RCA, severe disease of a ramus branch, moderate diffuse disease of the distal circumflex. Of note is the moderate to severe mid to distal left main disease. Severely depressed ejection fraction with akinesis or severe hypokinesis of the apical, periapical, inferior wall  Recommendations:  Case discussed with Dr. Fletcher Anon.  Given the severity of the disease, multiple vessels, left main disease, he will be referred for consideration of CABG.   Ida Rogue 07/05/2015, 12:24 PM  Estimated blood loss <50 mL. There were no immediate complications during the  procedure.    Conclusion     LM lesion, 70% stenosed.  Mid LAD-1 lesion, 100% stenosed.  Mid LAD-2 lesion, 90% stenosed.  2nd Diag lesion, 95% stenosed.  Prox LAD lesion, 80% stenosed.  Ramus lesion, 75% stenosed.  1st Diag lesion, 70% stenosed.  Lat 1st Diag lesion, 70% stenosed.  Prox RCA to Mid RCA lesion, 100% stenosed.  There is moderate left ventricular systolic dysfunction.     Coronary Findings    Dominance: Left   Left Main   . LM lesion, 70% stenosed.     Left Anterior Descending  The vessel is small .   Marland Kitchen Prox LAD lesion, 80% stenosed.   . Mid LAD-1 lesion, 100% stenosed. chronic total occlusion .   Marland Kitchen Mid LAD-2 lesion, 90% stenosed.   . First Diagonal Branch   . 1st Diag lesion, 70% stenosed.   . Lateral First Diagonal Branch   . Lat 1st Diag lesion, 70% stenosed.   Marland Kitchen  Second Engineer, production   . 2nd Diag lesion, 95% stenosed.   . Third Septal Branch   3rd Sept filled by collaterals from [place].     Ramus Intermedius   . Ramus lesion, 75% stenosed.     Left Circumflex  The vessel is small .   Marland Kitchen Second Obtuse Marginal Branch   The vessel is small in size.   Marland Kitchen Left Atrioventricular Groove Continuation   The vessel is small in size.     Right Coronary Artery  The vessel is small . Mid RCA filled by collaterals from Dist LAD.   Marland Kitchen Prox RCA to Mid RCA lesion, 100% stenosed. chronic total occlusion .      Wall Motion                 Left Heart    Left Ventricle The left ventricular size is normal. There is moderate left ventricular systolic dysfunction. The left ventricular ejection fraction is 25-35% by visual estimate.   Aortic Valve There is no aortic valve stenosis. There is normal aortic valve motion.    Coronary Diagrams    Diagnostic Diagram            Hemo Data    AO Systolic Cath Pressure AO  Diastolic Cath Pressure AO Mean Cath Pressure LV Systolic Cath Pressure LV End Diastolic   914 81 mmHg 782 mmHg -- --   -- -- -- 142 mmHg 15 mmHg   -- -- -- 32767 mmHg 32767 mmHg   -- -- -- 153 mmHg 16 mmHg   152 77 mmHg 104 mmHg -- --    Implants    Name ID Temporary Type Supply   DEVICE CLOSURE MYNXGRIP 73F - [id number] No Vascular Products DEVICE CLOSURE MYNXGRIP 73F    Order-Level Documents:    There are no order-level documents.    Encounter-Level Documents - 07/01/15:      Scan on 07/12/2015 8:45 AM by Provider Default, MDScan on 07/12/2015 8:45 AM by Provider Default, MD     Scan on 07/09/2015 12:02 PM by Provider Default, MDScan on 07/09/2015 12:02 PM by Provider Default, MD     Scan on 07/09/2015 11:51 AM by Provider Default, MDScan on 07/09/2015 11:51 AM by Provider Default, MD     Scan on 07/05/2015 10:57 AM by Provider Default, MDScan on 07/05/2015 10:57 AM by Provider Default, MD     Electronic signature on 07/05/2015 8:49 AM    Signed    Electronically signed by Minna Merritts, MD on 07/05/15 at 1236 EDT    Impression:  I have personally reviewed his cardiac cath films and his echo reports from [place]. He had significant left main and severe multi-vessel coronary artery disease with an occluded LAD and RCA, moderately depressed EF due to prior MI's that have occurred with no symptoms noticed by the patient. I agree that CABG is indicated for preservation of myocardium especially with his degree of disease and never having any noticeable symptoms. He will be able to undergo knee replacement after he recovers from this surgery. I discussed the operative procedure with the patient and family including alternatives, benefits and risks; including but not limited to bleeding, blood transfusion, infection, stroke, myocardial infarction, graft failure, heart block requiring a permanent pacemaker,  organ dysfunction, and death. [name] understands and agrees to proceed.   Plan:  CABG on  Monday 07/29/2015.  [name], MD [facility] [phone]

## 2015-07-29 ENCOUNTER — Inpatient Hospital Stay (HOSPITAL_COMMUNITY): Payer: Medicare Other

## 2015-07-29 ENCOUNTER — Inpatient Hospital Stay (HOSPITAL_COMMUNITY)
Admission: RE | Admit: 2015-07-29 | Discharge: 2015-08-02 | DRG: 236 | Disposition: A | Discharge status: Home / Self Care | Payer: Medicare Other | Source: Ambulatory Visit | Attending: Surgery | Admitting: Surgery

## 2015-07-29 ENCOUNTER — Inpatient Hospital Stay (HOSPITAL_COMMUNITY): Payer: Medicare Other | Admitting: Certified Registered Nurse Anesthetist

## 2015-07-29 ENCOUNTER — Inpatient Hospital Stay (HOSPITAL_COMMUNITY): Payer: Medicare Other | Admitting: Vascular Surgery

## 2015-07-29 ENCOUNTER — Inpatient Hospital Stay (HOSPITAL_COMMUNITY)
Admission: RE | Admit: 2015-07-29 | Discharge: 2015-07-29 | Disposition: A | Discharge status: Home / Self Care | Payer: Medicare Other | Source: Ambulatory Visit | Attending: Surgery | Admitting: Surgery

## 2015-07-29 ENCOUNTER — Encounter (HOSPITAL_COMMUNITY)
Admission: RE | Disposition: A | Discharge status: Home / Self Care | Payer: Medicare Other | Source: Ambulatory Visit | Attending: Surgery

## 2015-07-29 ENCOUNTER — Encounter: Payer: Medicare Other | Admitting: Cardiovascular Disease

## 2015-07-29 ENCOUNTER — Other Ambulatory Visit: Payer: Self-pay

## 2015-07-29 ENCOUNTER — Encounter (HOSPITAL_COMMUNITY): Payer: Self-pay | Admitting: *Deleted

## 2015-07-29 DIAGNOSIS — E119 Type 2 diabetes mellitus without complications: Secondary | ICD-10-CM | POA: Diagnosis present

## 2015-07-29 DIAGNOSIS — D696 Thrombocytopenia, unspecified: Secondary | ICD-10-CM | POA: Diagnosis not present

## 2015-07-29 DIAGNOSIS — I2510 Atherosclerotic heart disease of native coronary artery without angina pectoris: Secondary | ICD-10-CM

## 2015-07-29 DIAGNOSIS — M170 Bilateral primary osteoarthritis of knee: Secondary | ICD-10-CM | POA: Diagnosis present

## 2015-07-29 DIAGNOSIS — Z8521 Personal history of malignant neoplasm of larynx: Secondary | ICD-10-CM | POA: Diagnosis not present

## 2015-07-29 DIAGNOSIS — Z923 Personal history of irradiation: Secondary | ICD-10-CM | POA: Diagnosis not present

## 2015-07-29 DIAGNOSIS — I252 Old myocardial infarction: Secondary | ICD-10-CM

## 2015-07-29 DIAGNOSIS — Z87891 Personal history of nicotine dependence: Secondary | ICD-10-CM

## 2015-07-29 DIAGNOSIS — Z8249 Family history of ischemic heart disease and other diseases of the circulatory system: Secondary | ICD-10-CM

## 2015-07-29 DIAGNOSIS — D62 Acute posthemorrhagic anemia: Secondary | ICD-10-CM | POA: Diagnosis not present

## 2015-07-29 DIAGNOSIS — K219 Gastro-esophageal reflux disease without esophagitis: Secondary | ICD-10-CM | POA: Diagnosis not present

## 2015-07-29 DIAGNOSIS — E785 Hyperlipidemia, unspecified: Secondary | ICD-10-CM | POA: Diagnosis present

## 2015-07-29 DIAGNOSIS — E8779 Other fluid overload: Secondary | ICD-10-CM | POA: Diagnosis not present

## 2015-07-29 DIAGNOSIS — Z85828 Personal history of other malignant neoplasm of skin: Secondary | ICD-10-CM | POA: Diagnosis not present

## 2015-07-29 DIAGNOSIS — I2582 Chronic total occlusion of coronary artery: Secondary | ICD-10-CM | POA: Diagnosis present

## 2015-07-29 DIAGNOSIS — Z951 Presence of aortocoronary bypass graft: Secondary | ICD-10-CM

## 2015-07-29 DIAGNOSIS — I10 Essential (primary) hypertension: Secondary | ICD-10-CM | POA: Diagnosis present

## 2015-07-29 DIAGNOSIS — J9811 Atelectasis: Secondary | ICD-10-CM | POA: Diagnosis not present

## 2015-07-29 DIAGNOSIS — Z833 Family history of diabetes mellitus: Secondary | ICD-10-CM | POA: Diagnosis not present

## 2015-07-29 DIAGNOSIS — Z811 Family history of alcohol abuse and dependence: Secondary | ICD-10-CM | POA: Diagnosis not present

## 2015-07-29 DIAGNOSIS — I255 Ischemic cardiomyopathy: Secondary | ICD-10-CM | POA: Diagnosis present

## 2015-07-29 HISTORY — PX: CORONARY ARTERY BYPASS GRAFT: SHX141

## 2015-07-29 HISTORY — DX: Type 2 diabetes mellitus without complications: E11.9

## 2015-07-29 HISTORY — PX: TEE WITHOUT CARDIOVERSION: SHX5443

## 2015-07-29 LAB — POCT I-STAT, CHEM 8
BUN: 15 mg/dL (ref 6–20)
BUN: 15 mg/dL (ref 6–20)
BUN: 14 mg/dL (ref 6–20)
BUN: 12 mg/dL (ref 6–20)
BUN: 13 mg/dL (ref 6–20)
BUN: 11 mg/dL (ref 6–20)
CALCIUM ION: 1.21 mmol/L (ref 1.13–1.30)
CALCIUM ION: 1.2 mmol/L (ref 1.13–1.30)
CALCIUM ION: 1.06 mmol/L — AB (ref 1.13–1.30)
CHLORIDE: 102 mmol/L (ref 101–111)
CHLORIDE: 97 mmol/L — AB (ref 101–111)
CHLORIDE: 101 mmol/L (ref 101–111)
Calcium, Ion: 1.07 mmol/L — ABNORMAL LOW (ref 1.13–1.30)
Calcium, Ion: 1.11 mmol/L — ABNORMAL LOW (ref 1.13–1.30)
Calcium, Ion: 1.15 mmol/L (ref 1.13–1.30)
Chloride: 102 mmol/L (ref 101–111)
Chloride: 103 mmol/L (ref 101–111)
Chloride: 106 mmol/L (ref 101–111)
Creatinine, Ser: 0.8 mg/dL (ref 0.61–1.24)
Creatinine, Ser: 0.7 mg/dL (ref 0.61–1.24)
Creatinine, Ser: 0.7 mg/dL (ref 0.61–1.24)
Creatinine, Ser: 0.7 mg/dL (ref 0.61–1.24)
Creatinine, Ser: 0.7 mg/dL (ref 0.61–1.24)
Creatinine, Ser: 0.8 mg/dL (ref 0.61–1.24)
GLUCOSE: 141 mg/dL — AB (ref 65–99)
GLUCOSE: 149 mg/dL — AB (ref 65–99)
Glucose, Bld: 151 mg/dL — ABNORMAL HIGH (ref 65–99)
Glucose, Bld: 141 mg/dL — ABNORMAL HIGH (ref 65–99)
Glucose, Bld: 154 mg/dL — ABNORMAL HIGH (ref 65–99)
Glucose, Bld: 138 mg/dL — ABNORMAL HIGH (ref 65–99)
HCT: 39 % (ref 39.0–52.0)
HCT: 33 % — ABNORMAL LOW (ref 39.0–52.0)
HCT: 30 % — ABNORMAL LOW (ref 39.0–52.0)
HEMATOCRIT: 26 % — AB (ref 39.0–52.0)
HEMATOCRIT: 28 % — AB (ref 39.0–52.0)
HEMATOCRIT: 33 % — AB (ref 39.0–52.0)
HEMOGLOBIN: 13.3 g/dL (ref 13.0–17.0)
Hemoglobin: 11.2 g/dL — ABNORMAL LOW (ref 13.0–17.0)
Hemoglobin: 10.2 g/dL — ABNORMAL LOW (ref 13.0–17.0)
Hemoglobin: 8.8 g/dL — ABNORMAL LOW (ref 13.0–17.0)
Hemoglobin: 9.5 g/dL — ABNORMAL LOW (ref 13.0–17.0)
Hemoglobin: 11.2 g/dL — ABNORMAL LOW (ref 13.0–17.0)
POTASSIUM: 4.1 mmol/L (ref 3.5–5.1)
POTASSIUM: 4.7 mmol/L (ref 3.5–5.1)
Potassium: 4 mmol/L (ref 3.5–5.1)
Potassium: 4.2 mmol/L (ref 3.5–5.1)
Potassium: 3.9 mmol/L (ref 3.5–5.1)
Potassium: 4.3 mmol/L (ref 3.5–5.1)
SODIUM: 139 mmol/L (ref 135–145)
SODIUM: 139 mmol/L (ref 135–145)
SODIUM: 133 mmol/L — AB (ref 135–145)
SODIUM: 137 mmol/L (ref 135–145)
SODIUM: 140 mmol/L (ref 135–145)
Sodium: 140 mmol/L (ref 135–145)
TCO2: 28 mmol/L (ref 0–100)
TCO2: 24 mmol/L (ref 0–100)
TCO2: 28 mmol/L (ref 0–100)
TCO2: 25 mmol/L (ref 0–100)
TCO2: 22 mmol/L (ref 0–100)
TCO2: 23 mmol/L (ref 0–100)

## 2015-07-29 LAB — POCT I-STAT 3, ART BLOOD GAS (G3+)
ACID-BASE DEFICIT: 3 mmol/L — AB (ref 0.0–2.0)
ACID-BASE DEFICIT: 1 mmol/L (ref 0.0–2.0)
Acid-Base Excess: 3 mmol/L — ABNORMAL HIGH (ref 0.0–2.0)
Acid-Base Excess: 3 mmol/L — ABNORMAL HIGH (ref 0.0–2.0)
Acid-base deficit: 2 mmol/L (ref 0.0–2.0)
BICARBONATE: 24 meq/L (ref 20.0–24.0)
Bicarbonate: 27.9 mEq/L — ABNORMAL HIGH (ref 20.0–24.0)
Bicarbonate: 28.4 mEq/L — ABNORMAL HIGH (ref 20.0–24.0)
Bicarbonate: 21.7 mEq/L (ref 20.0–24.0)
Bicarbonate: 24.8 mEq/L — ABNORMAL HIGH (ref 20.0–24.0)
O2 SAT: 100 %
O2 SAT: 96 %
O2 Saturation: 100 %
O2 Saturation: 91 %
O2 Saturation: 95 %
PCO2 ART: 45.1 mmHg — AB (ref 35.0–45.0)
PCO2 ART: 45.8 mmHg — AB (ref 35.0–45.0)
PCO2 ART: 36.4 mmHg (ref 35.0–45.0)
PCO2 ART: 45.5 mmHg — AB (ref 35.0–45.0)
PCO2 ART: 45.2 mmHg — AB (ref 35.0–45.0)
PH ART: 7.399 (ref 7.350–7.450)
PH ART: 7.401 (ref 7.350–7.450)
PH ART: 7.345 — AB (ref 7.350–7.450)
PO2 ART: 59 mmHg — AB (ref 80.0–100.0)
PO2 ART: 79 mmHg — AB (ref 80.0–100.0)
PO2 ART: 87 mmHg (ref 80.0–100.0)
Patient temperature: 36
Patient temperature: 37
Patient temperature: 37
TCO2: 29 mmol/L (ref 0–100)
TCO2: 30 mmol/L (ref 0–100)
TCO2: 23 mmol/L (ref 0–100)
TCO2: 26 mmol/L (ref 0–100)
TCO2: 25 mmol/L (ref 0–100)
pH, Arterial: 7.379 (ref 7.350–7.450)
pH, Arterial: 7.333 — ABNORMAL LOW (ref 7.350–7.450)
pO2, Arterial: 406 mmHg — ABNORMAL HIGH (ref 80.0–100.0)
pO2, Arterial: 431 mmHg — ABNORMAL HIGH (ref 80.0–100.0)

## 2015-07-29 LAB — CBC
HEMATOCRIT: 41.6 % (ref 39.0–52.0)
HEMATOCRIT: 32.1 % — AB (ref 39.0–52.0)
HEMATOCRIT: 33.6 % — AB (ref 39.0–52.0)
HEMOGLOBIN: 10.8 g/dL — AB (ref 13.0–17.0)
HEMOGLOBIN: 11.1 g/dL — AB (ref 13.0–17.0)
Hemoglobin: 13.6 g/dL (ref 13.0–17.0)
MCH: 32 pg (ref 26.0–34.0)
MCH: 32.5 pg (ref 26.0–34.0)
MCH: 31.9 pg (ref 26.0–34.0)
MCHC: 32.7 g/dL (ref 30.0–36.0)
MCHC: 33.6 g/dL (ref 30.0–36.0)
MCHC: 33 g/dL (ref 30.0–36.0)
MCV: 97.9 fL (ref 78.0–100.0)
MCV: 96.7 fL (ref 78.0–100.0)
MCV: 96.6 fL (ref 78.0–100.0)
PLATELETS: 148 10*3/uL — AB (ref 150–400)
Platelets: 117 10*3/uL — ABNORMAL LOW (ref 150–400)
Platelets: 138 10*3/uL — ABNORMAL LOW (ref 150–400)
RBC: 4.25 MIL/uL (ref 4.22–5.81)
RBC: 3.32 MIL/uL — ABNORMAL LOW (ref 4.22–5.81)
RBC: 3.48 MIL/uL — AB (ref 4.22–5.81)
RDW: 13.8 % (ref 11.5–15.5)
RDW: 13.5 % (ref 11.5–15.5)
RDW: 13.7 % (ref 11.5–15.5)
WBC: 5.5 10*3/uL (ref 4.0–10.5)
WBC: 6 10*3/uL (ref 4.0–10.5)
WBC: 8.5 10*3/uL (ref 4.0–10.5)

## 2015-07-29 LAB — GLUCOSE, CAPILLARY
GLUCOSE-CAPILLARY: 92 mg/dL (ref 65–99)
GLUCOSE-CAPILLARY: 93 mg/dL (ref 65–99)
GLUCOSE-CAPILLARY: 139 mg/dL — AB (ref 65–99)
Glucose-Capillary: 108 mg/dL — ABNORMAL HIGH (ref 65–99)
Glucose-Capillary: 118 mg/dL — ABNORMAL HIGH (ref 65–99)
Glucose-Capillary: 131 mg/dL — ABNORMAL HIGH (ref 65–99)

## 2015-07-29 LAB — BASIC METABOLIC PANEL
ANION GAP: 7 (ref 5–15)
BUN: 13 mg/dL (ref 6–20)
CHLORIDE: 105 mmol/L (ref 101–111)
CO2: 27 mmol/L (ref 22–32)
Calcium: 9.5 mg/dL (ref 8.9–10.3)
Creatinine, Ser: 1.03 mg/dL (ref 0.61–1.24)
GFR calc non Af Amer: >60 mL/min (ref >60)
Glucose, Bld: 132 mg/dL — ABNORMAL HIGH (ref 65–99)
Potassium: 4.5 mmol/L (ref 3.5–5.1)
Sodium: 139 mmol/L (ref 135–145)

## 2015-07-29 LAB — CK TOTAL AND CKMB (NOT AT ARMC)
CK, MB: 4.8 ng/mL (ref 0.5–5.0)
RELATIVE INDEX: 2.8 — AB (ref 0.0–2.5)
Total CK: 173 U/L (ref 49–397)

## 2015-07-29 LAB — BRAIN NATRIURETIC PEPTIDE: B Natriuretic Peptide: 46.4 pg/mL (ref 0.0–100.0)

## 2015-07-29 LAB — POCT I-STAT 4, (NA,K, GLUC, HGB,HCT)
Glucose, Bld: 119 mg/dL — ABNORMAL HIGH (ref 65–99)
HEMATOCRIT: 30 % — AB (ref 39.0–52.0)
HEMOGLOBIN: 10.2 g/dL — AB (ref 13.0–17.0)
POTASSIUM: 3.3 mmol/L — AB (ref 3.5–5.1)
SODIUM: 141 mmol/L (ref 135–145)

## 2015-07-29 LAB — PROTIME-INR
INR: 1.16 (ref 0.00–1.49)
INR: 1.48 (ref 0.00–1.49)
Prothrombin Time: 15 seconds (ref 11.6–15.2)
Prothrombin Time: 18 seconds — ABNORMAL HIGH (ref 11.6–15.2)

## 2015-07-29 LAB — CREATININE, SERUM
Creatinine, Ser: 0.9 mg/dL (ref 0.61–1.24)
GFR calc non Af Amer: >60 mL/min (ref >60)

## 2015-07-29 LAB — APTT: APTT: 31 s (ref 24–37)

## 2015-07-29 LAB — MAGNESIUM: Magnesium: 2.9 mg/dL — ABNORMAL HIGH (ref 1.7–2.4)

## 2015-07-29 LAB — HEMOGLOBIN AND HEMATOCRIT, BLOOD
HEMATOCRIT: 27.5 % — AB (ref 39.0–52.0)
HEMOGLOBIN: 9.2 g/dL — AB (ref 13.0–17.0)

## 2015-07-29 LAB — PLATELET COUNT: PLATELETS: 87 10*3/uL — AB (ref 150–400)

## 2015-07-29 LAB — TROPONIN I

## 2015-07-29 SURGERY — CORONARY ARTERY BYPASS GRAFTING (CABG)
Anesthesia: General | Site: Chest

## 2015-07-29 MED ORDER — INSULIN REGULAR BOLUS VIA INFUSION
0.0000 [IU] | Freq: Three times a day (TID) | INTRAVENOUS | Status: DC
  Filled 2015-07-29: qty 10

## 2015-07-29 MED ORDER — HEMOSTATIC AGENTS (NO CHARGE) OPTIME
TOPICAL | Status: DC | PRN
  Administered 2015-07-29: 1 via TOPICAL

## 2015-07-29 MED ORDER — SODIUM CHLORIDE 0.9 % IJ SOLN: 3.0000 mL | Freq: Two times a day (BID) | INTRAMUSCULAR | Status: DC

## 2015-07-29 MED ORDER — SODIUM CHLORIDE 0.45 % IV SOLN
INTRAVENOUS | Status: DC | PRN
  Administered 2015-07-29: 20 mL/h via INTRAVENOUS

## 2015-07-29 MED ORDER — LACTATED RINGERS IV SOLN
INTRAVENOUS | Status: DC | PRN
  Administered 2015-07-29: 07:00:00 via INTRAVENOUS

## 2015-07-29 MED ORDER — NITROGLYCERIN IN D5W 200-5 MCG/ML-% IV SOLN: 0.0000 ug/min | INTRAVENOUS | Status: DC

## 2015-07-29 MED ORDER — ROCURONIUM BROMIDE 50 MG/5ML IV SOLN
INTRAVENOUS | Status: AC
  Filled 2015-07-29: qty 3

## 2015-07-29 MED ORDER — MORPHINE SULFATE (PF) 2 MG/ML IV SOLN: 1.0000 mg | INTRAVENOUS | Status: DC | PRN

## 2015-07-29 MED ORDER — ASPIRIN 81 MG PO CHEW: 324.0000 mg | CHEWABLE_TABLET | Freq: Every day | ORAL | Status: DC

## 2015-07-29 MED ORDER — MIDAZOLAM HCL 2 MG/2ML IJ SOLN: 2.0000 mg | INTRAMUSCULAR | Status: DC | PRN

## 2015-07-29 MED ORDER — SODIUM CHLORIDE 0.9 % IV SOLN
INTRAVENOUS | Status: DC | PRN
  Administered 2015-07-29 (×2): via INTRAVENOUS

## 2015-07-29 MED ORDER — PHENYLEPHRINE HCL 10 MG/ML IJ SOLN
INTRAMUSCULAR | Status: DC | PRN
  Administered 2015-07-29: 20 ug via INTRAVENOUS
  Administered 2015-07-29: 40 ug via INTRAVENOUS
  Administered 2015-07-29: 120 ug via INTRAVENOUS

## 2015-07-29 MED ORDER — ONDANSETRON HCL 4 MG/2ML IJ SOLN
INTRAMUSCULAR | Status: AC
  Filled 2015-07-29: qty 2

## 2015-07-29 MED ORDER — FENTANYL CITRATE (PF) 250 MCG/5ML IJ SOLN
INTRAMUSCULAR | Status: AC
  Filled 2015-07-29: qty 5

## 2015-07-29 MED ORDER — FENTANYL CITRATE (PF) 100 MCG/2ML IJ SOLN
INTRAMUSCULAR | Status: DC | PRN
  Administered 2015-07-29: 100 ug via INTRAVENOUS
  Administered 2015-07-29: 1100 ug via INTRAVENOUS
  Administered 2015-07-29 (×3): 50 ug via INTRAVENOUS
  Administered 2015-07-29: 150 ug via INTRAVENOUS
  Administered 2015-07-29: 50 ug via INTRAVENOUS
  Administered 2015-07-29: 200 ug via INTRAVENOUS
  Administered 2015-07-29: 100 ug via INTRAVENOUS
  Administered 2015-07-29: 150 ug via INTRAVENOUS
  Administered 2015-07-29: 100 ug via INTRAVENOUS
  Administered 2015-07-29 (×3): 50 ug via INTRAVENOUS

## 2015-07-29 MED ORDER — SODIUM CHLORIDE 0.9 % IV SOLN: Freq: Once | INTRAVENOUS | Status: DC

## 2015-07-29 MED ORDER — PROTAMINE SULFATE 10 MG/ML IV SOLN
INTRAVENOUS | Status: DC | PRN
  Administered 2015-07-29: 280 mg via INTRAVENOUS

## 2015-07-29 MED ORDER — DEXMEDETOMIDINE HCL IN NACL 200 MCG/50ML IV SOLN: 0.0000 ug/kg/h | INTRAVENOUS | Status: DC

## 2015-07-29 MED ORDER — SODIUM CHLORIDE 0.9 % IJ SOLN
INTRAMUSCULAR | Status: AC
  Filled 2015-07-29: qty 20

## 2015-07-29 MED ORDER — ONDANSETRON HCL 4 MG/2ML IJ SOLN: 4.0000 mg | Freq: Four times a day (QID) | INTRAMUSCULAR | Status: DC | PRN

## 2015-07-29 MED ORDER — MORPHINE SULFATE (PF) 2 MG/ML IV SOLN
2.0000 mg | INTRAVENOUS | Status: DC | PRN
  Administered 2015-07-29 – 2015-07-30 (×3): 2 mg via INTRAVENOUS
  Administered 2015-07-30: 4 mg via INTRAVENOUS
  Filled 2015-07-29: qty 2
  Filled 2015-07-29 (×3): qty 1

## 2015-07-29 MED ORDER — BISACODYL 5 MG PO TBEC: 10.0000 mg | DELAYED_RELEASE_TABLET | Freq: Every day | ORAL | Status: DC

## 2015-07-29 MED ORDER — STUDY - INVESTIGATIONAL DRUG SIMPLE RECORD
0.1000 ug/kg/min | Status: DC
  Administered 2015-07-29: .2 ug/kg/min via INTRAVENOUS
  Filled 2015-07-29: qty 0.01

## 2015-07-29 MED ORDER — LACTATED RINGERS IV SOLN: INTRAVENOUS | Status: DC

## 2015-07-29 MED ORDER — SIMVASTATIN 40 MG PO TABS
40.0000 mg | ORAL_TABLET | Freq: Every day | ORAL | Status: DC
  Administered 2015-07-30 – 2015-08-01 (×3): 40 mg via ORAL
  Filled 2015-07-29 (×4): qty 1

## 2015-07-29 MED ORDER — THROMBIN 20000 UNITS EX SOLR
CUTANEOUS | Status: AC
  Filled 2015-07-29: qty 20000

## 2015-07-29 MED ORDER — SODIUM CHLORIDE 0.9 % IV SOLN: 250.0000 mL | INTRAVENOUS | Status: DC

## 2015-07-29 MED ORDER — SODIUM CHLORIDE 0.9 % IJ SOLN: 3.0000 mL | INTRAMUSCULAR | Status: DC | PRN

## 2015-07-29 MED ORDER — FLUTICASONE PROPIONATE 50 MCG/ACT NA SUSP
1.0000 | NASAL | Status: DC | PRN
  Filled 2015-07-29: qty 16

## 2015-07-29 MED ORDER — GLYCOPYRROLATE 0.2 MG/ML IJ SOLN
INTRAMUSCULAR | Status: AC
  Filled 2015-07-29: qty 1

## 2015-07-29 MED ORDER — DOCUSATE SODIUM 100 MG PO CAPS: 200.0000 mg | ORAL_CAPSULE | Freq: Every day | ORAL | Status: DC

## 2015-07-29 MED ORDER — SODIUM CHLORIDE 0.9 % IV SOLN
INTRAVENOUS | Status: DC
  Filled 2015-07-29: qty 40

## 2015-07-29 MED ORDER — LACTATED RINGERS IV SOLN
INTRAVENOUS | Status: DC
  Administered 2015-07-29: 20 mL/h via INTRAVENOUS

## 2015-07-29 MED ORDER — ROCURONIUM BROMIDE 50 MG/5ML IV SOLN
INTRAVENOUS | Status: AC
  Filled 2015-07-29: qty 1

## 2015-07-29 MED ORDER — PHENYLEPHRINE 40 MCG/ML (10ML) SYRINGE FOR IV PUSH (FOR BLOOD PRESSURE SUPPORT)
PREFILLED_SYRINGE | INTRAVENOUS | Status: AC
  Filled 2015-07-29: qty 20

## 2015-07-29 MED ORDER — METOPROLOL TARTRATE 1 MG/ML IV SOLN: 2.5000 mg | INTRAVENOUS | Status: DC | PRN

## 2015-07-29 MED ORDER — ARTIFICIAL TEARS OP OINT
TOPICAL_OINTMENT | OPHTHALMIC | Status: DC | PRN
  Administered 2015-07-29: 1 via OPHTHALMIC

## 2015-07-29 MED ORDER — BISACODYL 10 MG RE SUPP: 10.0000 mg | Freq: Every day | RECTAL | Status: DC

## 2015-07-29 MED ORDER — LIDOCAINE HCL (CARDIAC) 20 MG/ML IV SOLN
INTRAVENOUS | Status: DC | PRN
  Administered 2015-07-29: 100 mg via INTRAVENOUS

## 2015-07-29 MED ORDER — ACETAMINOPHEN 160 MG/5ML PO SOLN: 650.0000 mg | Freq: Once | ORAL | Status: AC

## 2015-07-29 MED ORDER — METOPROLOL TARTRATE 25 MG/10 ML ORAL SUSPENSION
12.5000 mg | Freq: Two times a day (BID) | ORAL | Status: DC
Start: 2015-07-29 — End: 2015-07-30
  Filled 2015-07-29 (×3): qty 5

## 2015-07-29 MED ORDER — EPHEDRINE SULFATE 50 MG/ML IJ SOLN
INTRAMUSCULAR | Status: DC | PRN
  Administered 2015-07-29: 10 mg via INTRAVENOUS
  Administered 2015-07-29: 5 mg via INTRAVENOUS

## 2015-07-29 MED ORDER — MIDAZOLAM HCL 10 MG/2ML IJ SOLN
INTRAMUSCULAR | Status: AC
  Filled 2015-07-29: qty 4

## 2015-07-29 MED ORDER — LACTATED RINGERS IV SOLN: 500.0000 mL | Freq: Once | INTRAVENOUS | Status: DC | PRN

## 2015-07-29 MED ORDER — ALBUMIN HUMAN 5 % IV SOLN
250.0000 mL | INTRAVENOUS | Status: DC | PRN
  Administered 2015-07-29 (×2): 250 mL via INTRAVENOUS

## 2015-07-29 MED ORDER — OXYCODONE HCL 5 MG PO TABS: 5.0000 mg | ORAL_TABLET | ORAL | Status: DC | PRN

## 2015-07-29 MED ORDER — INSULIN REGULAR HUMAN 100 UNIT/ML IJ SOLN
INTRAMUSCULAR | Status: DC
  Administered 2015-07-29: 18:00:00 via INTRAVENOUS
  Filled 2015-07-29 (×2): qty 2.5

## 2015-07-29 MED ORDER — ACETAMINOPHEN 500 MG PO TABS
1000.0000 mg | ORAL_TABLET | Freq: Four times a day (QID) | ORAL | Status: DC
  Administered 2015-07-30: 1000 mg via ORAL
  Filled 2015-07-29 (×4): qty 2

## 2015-07-29 MED ORDER — MIDAZOLAM HCL 5 MG/5ML IJ SOLN
INTRAMUSCULAR | Status: DC | PRN
  Administered 2015-07-29: 2 mg via INTRAVENOUS
  Administered 2015-07-29: 3 mg via INTRAVENOUS
  Administered 2015-07-29: 2 mg via INTRAVENOUS
  Administered 2015-07-29: 1 mg via INTRAVENOUS
  Administered 2015-07-29: 2 mg via INTRAVENOUS

## 2015-07-29 MED ORDER — PANTOPRAZOLE SODIUM 40 MG PO TBEC
40.0000 mg | DELAYED_RELEASE_TABLET | Freq: Every day | ORAL | Status: DC
Start: 2015-07-31 — End: 2015-07-30

## 2015-07-29 MED ORDER — LIDOCAINE HCL (CARDIAC) 20 MG/ML IV SOLN
INTRAVENOUS | Status: AC
  Filled 2015-07-29: qty 10

## 2015-07-29 MED ORDER — POTASSIUM CHLORIDE 10 MEQ/50ML IV SOLN
10.0000 meq | INTRAVENOUS | Status: AC
  Administered 2015-07-29 (×2): 10 meq via INTRAVENOUS

## 2015-07-29 MED ORDER — POTASSIUM CHLORIDE 10 MEQ/50ML IV SOLN
10.0000 meq | INTRAVENOUS | Status: AC
  Administered 2015-07-29 (×3): 10 meq via INTRAVENOUS

## 2015-07-29 MED ORDER — STUDY - INVESTIGATIONAL DRUG SIMPLE RECORD
0.1000 ug/kg/min | Status: DC
  Filled 2015-07-29: qty 0.01

## 2015-07-29 MED ORDER — HEPARIN SODIUM (PORCINE) 1000 UNIT/ML IJ SOLN
INTRAMUSCULAR | Status: AC
  Filled 2015-07-29: qty 1

## 2015-07-29 MED ORDER — DEXTROSE 5 % IV SOLN
1.5000 g | Freq: Two times a day (BID) | INTRAVENOUS | Status: DC
  Administered 2015-07-29 – 2015-07-30 (×2): 1.5 g via INTRAVENOUS
  Filled 2015-07-29 (×3): qty 1.5

## 2015-07-29 MED ORDER — MAGNESIUM SULFATE 4 GM/100ML IV SOLN
4.0000 g | Freq: Once | INTRAVENOUS | Status: AC
  Administered 2015-07-29: 4 g via INTRAVENOUS
  Filled 2015-07-29: qty 100

## 2015-07-29 MED ORDER — TRAMADOL HCL 50 MG PO TABS: 50.0000 mg | ORAL_TABLET | ORAL | Status: DC | PRN

## 2015-07-29 MED ORDER — ROCURONIUM BROMIDE 100 MG/10ML IV SOLN
INTRAVENOUS | Status: DC | PRN
  Administered 2015-07-29: 20 mg via INTRAVENOUS
  Administered 2015-07-29 (×3): 50 mg via INTRAVENOUS
  Administered 2015-07-29: 20 mg via INTRAVENOUS

## 2015-07-29 MED ORDER — EPHEDRINE SULFATE 50 MG/ML IJ SOLN
INTRAMUSCULAR | Status: AC
  Filled 2015-07-29: qty 2

## 2015-07-29 MED ORDER — STUDY - INVESTIGATIONAL DRUG SIMPLE RECORD
0.2000 ug/kg/min | Status: DC
  Filled 2015-07-29: qty 0.01

## 2015-07-29 MED ORDER — ASPIRIN EC 325 MG PO TBEC
325.0000 mg | DELAYED_RELEASE_TABLET | Freq: Every day | ORAL | Status: DC
  Filled 2015-07-29: qty 1

## 2015-07-29 MED ORDER — DEXMEDETOMIDINE HCL IN NACL 200 MCG/50ML IV SOLN
INTRAVENOUS | Status: AC
Start: 2015-07-29 — End: 2015-07-29
  Filled 2015-07-29: qty 50

## 2015-07-29 MED ORDER — ACETAMINOPHEN 160 MG/5ML PO SOLN: 1000.0000 mg | Freq: Four times a day (QID) | ORAL | Status: DC

## 2015-07-29 MED ORDER — SODIUM CHLORIDE 0.9 % IV SOLN: INTRAVENOUS | Status: DC

## 2015-07-29 MED ORDER — PROPOFOL 10 MG/ML IV BOLUS
INTRAVENOUS | Status: DC | PRN
  Administered 2015-07-29 (×2): 50 mg via INTRAVENOUS
  Administered 2015-07-29: 20 mg via INTRAVENOUS

## 2015-07-29 MED ORDER — 0.9 % SODIUM CHLORIDE (POUR BTL) OPTIME
TOPICAL | Status: DC | PRN
  Administered 2015-07-29: 5000 mL

## 2015-07-29 MED ORDER — THROMBIN 20000 UNITS EX SOLR
OROMUCOSAL | Status: DC | PRN
  Administered 2015-07-29 (×3): 6 mL via TOPICAL

## 2015-07-29 MED ORDER — ACETAMINOPHEN 650 MG RE SUPP
650.0000 mg | Freq: Once | RECTAL | Status: AC
  Administered 2015-07-29: 650 mg via RECTAL

## 2015-07-29 MED ORDER — PROPOFOL 10 MG/ML IV BOLUS
INTRAVENOUS | Status: AC
  Filled 2015-07-29: qty 20

## 2015-07-29 MED ORDER — VANCOMYCIN HCL IN DEXTROSE 1-5 GM/200ML-% IV SOLN
1000.0000 mg | Freq: Once | INTRAVENOUS | Status: AC
  Administered 2015-07-29: 1000 mg via INTRAVENOUS
  Filled 2015-07-29: qty 200

## 2015-07-29 MED ORDER — PHENYLEPHRINE HCL 10 MG/ML IJ SOLN
0.0000 ug/min | INTRAVENOUS | Status: DC
  Filled 2015-07-29 (×2): qty 2

## 2015-07-29 MED ORDER — HEPARIN SODIUM (PORCINE) 1000 UNIT/ML IJ SOLN
INTRAMUSCULAR | Status: DC | PRN
  Administered 2015-07-29: 30000 [IU] via INTRAVENOUS

## 2015-07-29 MED ORDER — THROMBIN 20000 UNITS EX SOLR: CUTANEOUS | Status: DC | PRN

## 2015-07-29 MED ORDER — ARTIFICIAL TEARS OP OINT
TOPICAL_OINTMENT | OPHTHALMIC | Status: AC
  Filled 2015-07-29: qty 3.5

## 2015-07-29 MED ORDER — HEPARIN SODIUM (PORCINE) 1000 UNIT/ML IJ SOLN
INTRAMUSCULAR | Status: AC
  Filled 2015-07-29: qty 4

## 2015-07-29 MED ORDER — FAMOTIDINE IN NACL 20-0.9 MG/50ML-% IV SOLN
20.0000 mg | Freq: Two times a day (BID) | INTRAVENOUS | Status: DC
  Administered 2015-07-29: 20 mg via INTRAVENOUS

## 2015-07-29 MED ORDER — PROTAMINE SULFATE 10 MG/ML IV SOLN
INTRAVENOUS | Status: AC
Start: 2015-07-29 — End: 2015-07-29
  Filled 2015-07-29: qty 25

## 2015-07-29 MED ORDER — METOPROLOL TARTRATE 12.5 MG HALF TABLET
12.5000 mg | ORAL_TABLET | Freq: Two times a day (BID) | ORAL | Status: DC
Start: 2015-07-29 — End: 2015-07-30
  Filled 2015-07-29 (×3): qty 1

## 2015-07-29 MED FILL — Magnesium Sulfate Inj 50%: INTRAMUSCULAR | Qty: 10 | Status: AC

## 2015-07-29 MED FILL — Electrolyte-R (PH 7.4) Solution: INTRAVENOUS | Qty: 4000 | Status: AC

## 2015-07-29 MED FILL — Heparin Sodium (Porcine) Inj 1000 Unit/ML: INTRAMUSCULAR | Qty: 30 | Status: AC

## 2015-07-29 MED FILL — Heparin Sodium (Porcine) Inj 1000 Unit/ML: INTRAMUSCULAR | Qty: 10 | Status: AC

## 2015-07-29 MED FILL — Mannitol IV Soln 20%: INTRAVENOUS | Qty: 500 | Status: AC

## 2015-07-29 MED FILL — Sodium Bicarbonate IV Soln 8.4%: INTRAVENOUS | Qty: 50 | Status: AC

## 2015-07-29 MED FILL — Sodium Chloride IV Soln 0.9%: INTRAVENOUS | Qty: 2000 | Status: AC

## 2015-07-29 MED FILL — Lidocaine HCl IV Inj 20 MG/ML: INTRAVENOUS | Qty: 5 | Status: AC

## 2015-07-29 MED FILL — Potassium Chloride Inj 2 mEq/ML: INTRAVENOUS | Qty: 40 | Status: AC

## 2015-07-29 SURGICAL SUPPLY — 113 items
BAG DECANTER FOR FLEXI CONT (MISCELLANEOUS) ×3 IMPLANT
BANDAGE ELASTIC 4 VELCRO ST LF (GAUZE/BANDAGES/DRESSINGS) ×3 IMPLANT
BANDAGE ELASTIC 6 VELCRO ST LF (GAUZE/BANDAGES/DRESSINGS) ×3 IMPLANT
BASKET HEART (ORDER IN 25'S) (MISCELLANEOUS) ×1
BASKET HEART (ORDER IN 25S) (MISCELLANEOUS) ×2 IMPLANT
BLADE STERNUM SYSTEM 6 (BLADE) ×3 IMPLANT
BLADE SURG 11 STRL SS (BLADE) ×3 IMPLANT
BNDG GAUZE ELAST 4 BULKY (GAUZE/BANDAGES/DRESSINGS) ×3 IMPLANT
CANISTER SUCTION 2500CC (MISCELLANEOUS) ×3 IMPLANT
CANNULA ARTERIAL NVNT 3/8 22FR (MISCELLANEOUS) ×3 IMPLANT
CATH ROBINSON RED A/P 18FR (CATHETERS) ×6 IMPLANT
CATH THORACIC 28FR (CATHETERS) ×3 IMPLANT
CATH THORACIC 36FR (CATHETERS) ×3 IMPLANT
CATH THORACIC 36FR RT ANG (CATHETERS) ×3 IMPLANT
CLIP TI MEDIUM 24 (CLIP) IMPLANT
CLIP TI WIDE RED SMALL 24 (CLIP) ×3 IMPLANT
COVER SURGICAL LIGHT HANDLE (MISCELLANEOUS) ×3 IMPLANT
CRADLE DONUT ADULT HEAD (MISCELLANEOUS) ×3 IMPLANT
DRAPE CARDIOVASCULAR INCISE (DRAPES) ×1
DRAPE SLUSH/WARMER DISC (DRAPES) ×3 IMPLANT
DRAPE SRG 135X102X78XABS (DRAPES) ×2 IMPLANT
DRSG COVADERM 4X14 (GAUZE/BANDAGES/DRESSINGS) ×3 IMPLANT
DRSG KUZMA FLUFF (GAUZE/BANDAGES/DRESSINGS) ×3 IMPLANT
ELECT CAUTERY BLADE 6.4 (BLADE) ×3 IMPLANT
ELECT REM PT RETURN 9FT ADLT (ELECTROSURGICAL) ×6
ELECTRODE REM PT RTRN 9FT ADLT (ELECTROSURGICAL) ×4 IMPLANT
GAUZE SPONGE 4X4 12PLY STRL (GAUZE/BANDAGES/DRESSINGS) ×6 IMPLANT
GLOVE BIO SURGEON STRL SZ 6 (GLOVE) IMPLANT
GLOVE BIO SURGEON STRL SZ 6.5 (GLOVE) ×18 IMPLANT
GLOVE BIO SURGEON STRL SZ7 (GLOVE) ×3 IMPLANT
GLOVE BIO SURGEON STRL SZ7.5 (GLOVE) IMPLANT
GLOVE BIOGEL PI IND STRL 6 (GLOVE) IMPLANT
GLOVE BIOGEL PI IND STRL 6.5 (GLOVE) ×6 IMPLANT
GLOVE BIOGEL PI IND STRL 7.0 (GLOVE) ×2 IMPLANT
GLOVE BIOGEL PI INDICATOR 6 (GLOVE)
GLOVE BIOGEL PI INDICATOR 6.5 (GLOVE) ×3
GLOVE BIOGEL PI INDICATOR 7.0 (GLOVE) ×1
GLOVE EUDERMIC 7 POWDERFREE (GLOVE) ×6 IMPLANT
GLOVE ORTHO TXT STRL SZ7.5 (GLOVE) IMPLANT
GOWN STRL REUS W/ TWL LRG LVL3 (GOWN DISPOSABLE) ×16 IMPLANT
GOWN STRL REUS W/ TWL XL LVL3 (GOWN DISPOSABLE) ×2 IMPLANT
GOWN STRL REUS W/TWL LRG LVL3 (GOWN DISPOSABLE) ×8
GOWN STRL REUS W/TWL XL LVL3 (GOWN DISPOSABLE) ×1
HEMOSTAT POWDER SURGIFOAM 1G (HEMOSTASIS) ×9 IMPLANT
HEMOSTAT SURGICEL 2X14 (HEMOSTASIS) ×3 IMPLANT
INSERT FOGARTY 61MM (MISCELLANEOUS) IMPLANT
INSERT FOGARTY XLG (MISCELLANEOUS) IMPLANT
KIT BASIN OR (CUSTOM PROCEDURE TRAY) ×3 IMPLANT
KIT CATH CPB BARTLE (MISCELLANEOUS) ×3 IMPLANT
KIT ROOM TURNOVER OR (KITS) ×3 IMPLANT
KIT SUCTION CATH 14FR (SUCTIONS) ×3 IMPLANT
KIT VASOVIEW W/TROCAR VH 2000 (KITS) ×3 IMPLANT
LINE EXTENSION DELIVERY (MISCELLANEOUS) ×3 IMPLANT
LIQUID BAND (GAUZE/BANDAGES/DRESSINGS) ×3 IMPLANT
NS IRRIG 1000ML POUR BTL (IV SOLUTION) ×15 IMPLANT
PACK OPEN HEART (CUSTOM PROCEDURE TRAY) ×3 IMPLANT
PAD ARMBOARD 7.5X6 YLW CONV (MISCELLANEOUS) ×6 IMPLANT
PAD ELECT DEFIB RADIOL ZOLL (MISCELLANEOUS) ×3 IMPLANT
PENCIL BUTTON HOLSTER BLD 10FT (ELECTRODE) ×3 IMPLANT
PUNCH AORTIC ROTATE 4.0MM (MISCELLANEOUS) IMPLANT
PUNCH AORTIC ROTATE 4.5MM 8IN (MISCELLANEOUS) ×3 IMPLANT
PUNCH AORTIC ROTATE 5MM 8IN (MISCELLANEOUS) IMPLANT
SET CARDIOPLEGIA MPS 5001102 (MISCELLANEOUS) ×3 IMPLANT
SPONGE GAUZE 4X4 12PLY STER LF (GAUZE/BANDAGES/DRESSINGS) ×6 IMPLANT
SPONGE INTESTINAL PEANUT (DISPOSABLE) IMPLANT
SPONGE LAP 18X18 X RAY DECT (DISPOSABLE) ×3 IMPLANT
SPONGE LAP 4X18 X RAY DECT (DISPOSABLE) ×3 IMPLANT
SUT BONE WAX W31G (SUTURE) ×3 IMPLANT
SUT ETHIBOND 2 0 SH (SUTURE) ×4
SUT ETHIBOND 2 0 SH 36X2 (SUTURE) ×8 IMPLANT
SUT MNCRL AB 4-0 PS2 18 (SUTURE) IMPLANT
SUT PROLENE 3 0 SH DA (SUTURE) IMPLANT
SUT PROLENE 3 0 SH1 36 (SUTURE) ×3 IMPLANT
SUT PROLENE 4 0 RB 1 (SUTURE) ×1
SUT PROLENE 4 0 SH DA (SUTURE) IMPLANT
SUT PROLENE 4-0 RB1 .5 CRCL 36 (SUTURE) ×2 IMPLANT
SUT PROLENE 5 0 C 1 36 (SUTURE) ×6 IMPLANT
SUT PROLENE 6 0 C 1 30 (SUTURE) ×9 IMPLANT
SUT PROLENE 7 0 BV 1 (SUTURE) IMPLANT
SUT PROLENE 7 0 BV1 MDA (SUTURE) ×6 IMPLANT
SUT PROLENE 8 0 BV175 6 (SUTURE) ×6 IMPLANT
SUT SILK  1 MH (SUTURE) ×3
SUT SILK 1 MH (SUTURE) ×6 IMPLANT
SUT SILK 1 TIES 10X30 (SUTURE) ×3 IMPLANT
SUT SILK 2 0 SH CR/8 (SUTURE) ×6 IMPLANT
SUT SILK 2 0 TIES 10X30 (SUTURE) ×3 IMPLANT
SUT SILK 2 0 TIES 17X18 (SUTURE) ×1
SUT SILK 2-0 18XBRD TIE BLK (SUTURE) ×2 IMPLANT
SUT SILK 3 0 SH CR/8 (SUTURE) ×3 IMPLANT
SUT SILK 4 0 TIE 10X30 (SUTURE) ×6 IMPLANT
SUT STEEL STERNAL CCS#1 18IN (SUTURE) IMPLANT
SUT STEEL SZ 6 DBL 3X14 BALL (SUTURE) ×9 IMPLANT
SUT TEM PAC WIRE 2 0 SH (SUTURE) ×12 IMPLANT
SUT VIC AB 1 CTX 36 (SUTURE) ×2
SUT VIC AB 1 CTX36XBRD ANBCTR (SUTURE) ×4 IMPLANT
SUT VIC AB 2-0 CT1 27 (SUTURE) ×1
SUT VIC AB 2-0 CT1 TAPERPNT 27 (SUTURE) ×2 IMPLANT
SUT VIC AB 2-0 CTX 27 (SUTURE) ×6 IMPLANT
SUT VIC AB 3-0 SH 27 (SUTURE)
SUT VIC AB 3-0 SH 27X BRD (SUTURE) IMPLANT
SUT VIC AB 3-0 X1 27 (SUTURE) ×3 IMPLANT
SUT VICRYL 4-0 PS2 18IN ABS (SUTURE) ×3 IMPLANT
SUTURE E-PAK OPEN HEART (SUTURE) ×3 IMPLANT
SYSTEM SAHARA CHEST DRAIN ATS (WOUND CARE) ×3 IMPLANT
TABLE PACK (MISCELLANEOUS) ×3 IMPLANT
TAPE CLOTH SURG 4X10 WHT LF (GAUZE/BANDAGES/DRESSINGS) ×6 IMPLANT
TAPE PAPER 2X10 WHT MICROPORE (GAUZE/BANDAGES/DRESSINGS) ×3 IMPLANT
TOWEL OR 17X24 6PK STRL BLUE (TOWEL DISPOSABLE) ×3 IMPLANT
TOWEL OR 17X26 10 PK STRL BLUE (TOWEL DISPOSABLE) ×3 IMPLANT
TRAY FOLEY IC TEMP SENS 16FR (CATHETERS) ×3 IMPLANT
TUBING INSUFFLATION (TUBING) ×3 IMPLANT
UNDERPAD 30X30 INCONTINENT (UNDERPADS AND DIAPERS) ×3 IMPLANT
WATER STERILE IRR 1000ML POUR (IV SOLUTION) ×6 IMPLANT

## 2015-07-29 NOTE — Brief Op Note (Signed)
07/29/2015  11:10 AM  PATIENT:  Shannon Chung  74 y.o. male  PRE-OPERATIVE DIAGNOSIS:  Coronary Artery Disease  POST-OPERATIVE DIAGNOSIS:  Coronary Artery Disease  PROCEDURE:  TRANSESOPHAGEAL ECHOCARDIOGRAM (TEE), MEDIAN STERNOTOMY for CORONARY ARTERY BYPASS GRAFTING (CABG) x 5 (LIMA to LAD, SVG to DIAGONAL,  SVG SEQUENTIALLY to OM1 and OM2, SVG to OM3) with EVH of  GREATER SAPHENOUS VEIN from RIGHT THIGH and partial LOWER LEG  SURGEON:  Surgeon(s) and Role:    * [name], MD - Primary  PHYSICIAN ASSISTANT: Lars Pinks PA-C  ANESTHESIA:   general  EBL:  Total I/O In: 513 [I.V.:500; IV Piggyback:13] Out: 400 [Urine:400]  DRAINS: Chest tubes placed in the mediastinal and pleural spaces   COUNTS CORRECT:  YES  DICTATION: .Dragon Dictation  PLAN OF CARE: Admit to inpatient   PATIENT DISPOSITION:  ICU - intubated and hemodynamically stable.   Delay start of Pharmacological VTE agent (>24hrs) due to surgical blood loss or risk of bleeding: yes  BASELINE WEIGHT: 105 kg

## 2015-07-29 NOTE — Research (Signed)
LEVO-CTS Informed Consent   Subject Name: Shannon Chung  Subject met inclusion and exclusion criteria.  The informed consent form, study requirements and expectations were reviewed with the subject and questions and concerns were addressed prior to the signing of the consent form.  The subject verbalized understanding of the trial requirements.  The subject agreed to participate in the LEVO-CTS trial and signed the informed consent.  The informed consent was obtained prior to performance of any protocol-specific procedures for the subject.  A copy of the signed informed consent was given to the subject and a copy was placed in the subject's medical record.  Berneda Rose 07/29/2015, 5:51 AM

## 2015-07-29 NOTE — Op Note (Signed)
CARDIOVASCULAR SURGERY OPERATIVE NOTE  07/29/2015  Surgeon:  [name], MD  First Assistant: Lars Pinks,  PA-C   Preoperative Diagnosis:  Left main and severe multi-vessel coronary artery disease   Postoperative Diagnosis:  Same   Procedure:  1. Median Sternotomy 2. Extracorporeal circulation 3.   Coronary artery bypass grafting x 5   Left internal mammary graft to the LAD  SVG to diagonal  SVG to OM3  Sequential SVG to OM1 and OM2 4.   Endoscopic vein harvest from the right leg   Anesthesia:  General Endotracheal   Clinical History/Surgical Indication:  The patient is a 74 year old gentleman with hypertension, and dyslipidemia who lived in [place] until this year. He says that he was noted by his previous physician in [place] to have an abnormal ECG but was asymptomatic and therefore underwent a PET viability study in July 2015 that showed scar in the periapical region with no ischemia and an EF of 45%. There was mural thrombus and he was treated with coumdin. A follow up echo in Nov 2015 showed an EF of 45% with no residual thrombus. He was seen by Dr. Rockey Situ in March 2016 to establish care and was asymptomatic. He was continued on medical therapy. He has severe DJD of both knees and needs to have a knee replacement. He underwent a CT cardiac scoring which was more than 1100 and therefore a cath was performed on 07/05/2015 showing severe multi-vessel CAD with an occluded LAD, occluded RCA and 60-70% mid to distal LM stenosis. LVEF was moderately depressed with akinesis of the apical anterior, apical and apical inferior walls. He continues to deny any symptoms of chest pain or shortness of breath but says he does get tired at the end of the day and can take a nap any afternoon.   He had significant left main and severe multi-vessel coronary artery disease with an occluded  LAD and RCA, moderately depressed EF due to prior MI's that have occurred with no symptoms noticed by the patient. I agree that CABG is indicated for preservation of myocardium especially with his degree of disease and never having any noticeable symptoms. He will be able to undergo knee replacement after he recovers from this surgery. I discussed the operative procedure with the patient and family including alternatives, benefits and risks; including but not limited to bleeding, blood transfusion, infection, stroke, myocardial infarction, graft failure, heart block requiring a permanent pacemaker, organ dysfunction, and death. Shannon Chung understands and agrees to proceed.   Preparation:  The patient was seen in the preoperative holding area and the correct patient, correct operation were confirmed with the patient after reviewing the medical record and catheterization. The consent was signed by me. Preoperative antibiotics were given. A pulmonary arterial line and radial arterial line were placed by the anesthesia team. The patient was taken back to the operating room and positioned supine on the operating room table. After being placed under general endotracheal anesthesia by the anesthesia team a foley catheter was placed. The neck, chest, abdomen, and both legs were prepped with betadine soap and solution and draped in the usual sterile manner. A surgical time-out was taken and the correct patient and operative procedure were confirmed with the nursing and anesthesia staff.  TEE: performed by Dr. Lillia Abed  This showed an EF of 50% with no MR.   Cardiopulmonary Bypass:  A median sternotomy was performed. The pericardium was opened in the midline. Right ventricular function appeared normal. The  ascending aorta was of normal size and had no palpable plaque. There were no contraindications to aortic cannulation or cross-clamping. The patient was fully systemically heparinized and the ACT was  maintained > 400 sec. The proximal aortic arch was cannulated with a 68 F aortic cannula for arterial inflow. Venous cannulation was performed via the right atrial appendage using a two-staged venous cannula. An antegrade cardioplegia/vent cannula was inserted into the mid-ascending aorta. Aortic occlusion was performed with a single cross-clamp. Systemic cooling to 32 degrees Centigrade and topical cooling of the heart with iced saline were used. Hyperkalemic antegrade cold blood cardioplegia was used to induce diastolic arrest and was then given at about 20 minute intervals throughout the period of arrest to maintain myocardial temperature at or below 10 degrees centigrade. A temperature probe was inserted into the interventricular septum and an insulating pad was placed in the pericardium.   Left internal mammary harvest:  The left side of the sternum was retracted using the Rultract retractor. The left internal mammary artery was harvested as a pedicle graft. All side branches were clipped. It was a medium-sized vessel of good quality with excellent blood flow. It was ligated distally and divided. It was sprayed with topical papaverine solution to prevent vasospasm.   Endoscopic vein harvest:  The right greater saphenous vein was harvested endoscopically through a 2 cm incision medial to the right knee. It was harvested from the upper thigh to below the knee. It was a medium-sized vein of good quality. The side branches were all ligated with 4-0 silk ties.    Coronary arteries:  The coronary arteries were examined.   LAD:  Diffusely diseased but graftable distally. The diagonal was heavily diseased proximally but distally just before bifurcating it was graftable.  LCX:  OM1, OM2, and OM3 all large intramyocardial vessels that were graftable.  RCA:  The RCA  And PDA were diffusely diseased with no visible lumen and not graftable. There was extensive scar on the inferior  wall.   Grafts:  1. LIMA to the LAD: 1.6 mm. It was sewn end to side using 8-0 prolene continuous suture. 2. SVG to diagonal:  1.6 mm. It was sewn end to side using 7-0 prolene continuous suture. 3. Sequential SVG to OM1:  1.75 mm. It was sewn sequential side to side using 7-0 prolene continuous suture. 4. Sequential SVG to OM2:  1.75  mm. It was sewn sequential end to side using 7-0 prolene continuous suture. 5. SVG to OM3: 2.0 mm.  It was sewn sequential end to side using 7-0 prolene continuous suture.   The proximal vein graft anastomoses were performed to the mid-ascending aorta using continuous 6-0 prolene suture. Graft markers were placed around the proximal anastomoses.   Completion:  The patient was rewarmed to 37 degrees Centigrade. The clamp was removed from the LIMA pedicle and there was rapid warming of the septum and return of ventricular fibrillation. The crossclamp was removed with a time of 97 minutes. There was spontaneous return of sinus rhythm. The distal and proximal anastomoses were checked for hemostasis. The position of the grafts was satisfactory. Two temporary epicardial pacing wires were placed on the right atrium and two on the right ventricle. The patient was weaned from CPB without difficulty on no inotropes. CPB time was 115 minutes. Cardiac output was 6 LPM. Heparin was fully reversed with protamine and the aortic and venous cannulas removed. Hemostasis was achieved. Mediastinal and left pleural drainage tubes were placed. The sternum was  closed with double #6 stainless steel wires. The fascia was closed with continuous # 1 vicryl suture. The subcutaneous tissue was closed with 2-0 vicryl continuous suture. The skin was closed with 3-0 vicryl subcuticular suture. All sponge, needle, and instrument counts were reported correct at the end of the case. Dry sterile dressings were placed over the incisions and around the chest tubes which were connected to pleurevac  suction. The patient was then transported to the surgical intensive care unit in critical but stable condition.

## 2015-07-29 NOTE — Progress Notes (Signed)
  Echocardiogram Echocardiogram Transesophageal has been performed.  Jennette Dubin 07/29/2015, 9:05 AM

## 2015-07-29 NOTE — Anesthesia Procedure Notes (Addendum)
Procedure Name: Intubation Date/Time: 07/29/2015 8:18 AM Performed by: Merdis Delay Pre-anesthesia Checklist: Patient identified, Timeout performed, Emergency Drugs available, Suction available and Patient being monitored Patient Re-evaluated:Patient Re-evaluated prior to inductionOxygen Delivery Method: Circle system utilized Preoxygenation: Pre-oxygenation with 100% oxygen Intubation Type: IV induction Ventilation: Mask ventilation without difficulty and Oral airway inserted - appropriate to patient size Laryngoscope Size: Mac and 4 Grade View: Grade III Tube type: Oral Tube size: 8.0 mm Number of attempts: 1 Airway Equipment and Method: Stylet Placement Confirmation: ETT inserted through vocal cords under direct vision,  breath sounds checked- equal and bilateral,  positive ETCO2 and CO2 detector Secured at: 24 cm Tube secured with: Tape Dental Injury: Teeth and Oropharynx as per pre-operative assessment

## 2015-07-29 NOTE — Procedures (Signed)
Extubation Procedure Note  Patient Details:   Name: Shannon Chung DOB: [birth year] MRN: [id number]   Airway Documentation:     Evaluation  O2 sats: stable throughout Complications: No apparent complications Patient did tolerate procedure well. Bilateral Breath Sounds: Clear, Diminished Suctioning: Airway Yes Positive cuff leak, NIF -45, VC 1.0. Vital signs stable. Nurse at bedside.  [place] R Jamani Bearce 07/29/2015, 6:04 PM

## 2015-07-29 NOTE — Progress Notes (Signed)
      [address]       [place] [address]             [phone]       Alert, visiting with family  BP 116/70 mmHg  Pulse 80  Temp(Src) 98.2 F (36.8 C) (Core (Comment))  Resp 14  Ht 6\' 1"  (1.854 m)  Wt 232 lb 12.9 oz (105.6 kg)  BMI 30.72 kg/m2  SpO2 98%  Intake/Output Summary (Last 24 hours) at 07/29/15 2045 Last data filed at 07/29/15 1902  Gross per 24 hour  Intake 5695.54 ml  Output   3690 ml  Net 2005.54 ml   Doing well post CABG  Remo Lipps C. Roxan Hockey, MD [facility] [phone]

## 2015-07-29 NOTE — Interval H&P Note (Signed)
History and Physical Interval Note:  07/29/2015 6:43 AM  Shannon Chung  has presented today for surgery, with the diagnosis of CAD  The various methods of treatment have been discussed with the patient and family. After consideration of risks, benefits and other options for treatment, the patient has consented to  Procedure(s): CORONARY ARTERY BYPASS GRAFTING (CABG) (N/A) TRANSESOPHAGEAL ECHOCARDIOGRAM (TEE) (N/A) as a surgical intervention .  The patient's history has been reviewed, patient examined, no change in status, stable for surgery.  I have reviewed the patient's chart and labs.  Questions were answered to the patient's satisfaction.     [name]

## 2015-07-29 NOTE — OR Nursing (Signed)
SICU notified of surgery progress at 1142, 1202, and 1220.

## 2015-07-29 NOTE — Transfer of Care (Addendum)
Immediate Anesthesia Transfer of Care Note  Patient: Dennise Liming  Procedure(s) Performed: Procedure(s): CORONARY ARTERY BYPASS GRAFTING (CABG) x 5 (LIMA to LAD, SVG to DIAGONAL, SVG SEQUENTIALLY to OM1 and OM2, SVG to OM3) with Endoscopic Vein Havesting of GREATER SAPHENOUS VEIN from RIGHT THIGH and partial LOWER LEG  (N/A) TRANSESOPHAGEAL ECHOCARDIOGRAM (TEE) (N/A)  Patient Location: SICU  Anesthesia Type:General  Level of Consciousness: sedated and Patient remains intubated per anesthesia plan  Airway & Oxygen Therapy: Patient remains intubated per anesthesia plan and Patient placed on Ventilator (see vital sign flow sheet for setting)  Post-op Assessment: Report given to RN and Post -op Vital signs reviewed and unstable, Anesthesiologist notified  Post vital signs: Reviewed and stable  Last Vitals:  Filed Vitals:   07/29/15 1300  BP: 84/50  Pulse: 80  Temp:   Resp: 16    Complications: No apparent anesthesia complications   BP 87/21. SPO2 94 on 50% fi02. Pacing at AAI at 80. VSS.

## 2015-07-29 NOTE — Anesthesia Preprocedure Evaluation (Signed)
Anesthesia Evaluation  Patient identified by MRN, date of birth, ID band Patient awake    Reviewed: Allergy & Precautions, NPO status , Patient's Chart, lab work & pertinent test results  Airway Mallampati: II  TM Distance: >3 FB Neck ROM: Full    Dental   Pulmonary former smoker,    Pulmonary exam normal        Cardiovascular hypertension, Pt. on medications + angina + CAD and + Past MI  Normal cardiovascular exam     Neuro/Psych    GI/Hepatic GERD  Medicated and Controlled,  Endo/Other    Renal/GU      Musculoskeletal   Abdominal   Peds  Hematology   Anesthesia Other Findings   Reproductive/Obstetrics                             Anesthesia Physical Anesthesia Plan  ASA: III  Anesthesia Plan: General   Post-op Pain Management:    Induction: Intravenous  Airway Management Planned: Oral ETT  Additional Equipment: Arterial line, CVP, PA Cath, TEE and Ultrasound Guidance Line Placement  Intra-op Plan:   Post-operative Plan: Post-operative intubation/ventilation  Informed Consent: I have reviewed the patients History and Physical, chart, labs and discussed the procedure including the risks, benefits and alternatives for the proposed anesthesia with the patient or authorized representative who has indicated his/her understanding and acceptance.     Plan Discussed with: CRNA and Surgeon  Anesthesia Plan Comments:         Anesthesia Quick Evaluation

## 2015-07-29 NOTE — Anesthesia Postprocedure Evaluation (Signed)
Anesthesia Post Note  Patient: Shannon Chung  Procedure(s) Performed: Procedure(s) (LRB): CORONARY ARTERY BYPASS GRAFTING (CABG) x 5 (LIMA to LAD, SVG to DIAGONAL, SVG SEQUENTIALLY to OM1 and OM2, SVG to OM3) with Endoscopic Vein Havesting of GREATER SAPHENOUS VEIN from RIGHT THIGH and partial LOWER LEG  (N/A) TRANSESOPHAGEAL ECHOCARDIOGRAM (TEE) (N/A)  Anesthesia type: General  Patient location: ICU  Post pain: Pain level controlled  Post assessment: Post-op Vital signs reviewed  Last Vitals:  Filed Vitals:   07/29/15 1530  BP: 106/71  Pulse: 88  Temp: 35.6 C  Resp: 12    Post vital signs: stable  Level of consciousness: Patient remains intubated per anesthesia plan  Complications: No apparent anesthesia complications

## 2015-07-30 ENCOUNTER — Inpatient Hospital Stay (HOSPITAL_COMMUNITY): Payer: Medicare Other

## 2015-07-30 ENCOUNTER — Encounter (HOSPITAL_COMMUNITY): Payer: Self-pay | Admitting: Surgery

## 2015-07-30 LAB — BASIC METABOLIC PANEL
ANION GAP: 7 (ref 5–15)
BUN: 8 mg/dL (ref 6–20)
CALCIUM: 8 mg/dL — AB (ref 8.9–10.3)
CO2: 24 mmol/L (ref 22–32)
Chloride: 104 mmol/L (ref 101–111)
Creatinine, Ser: 0.85 mg/dL (ref 0.61–1.24)
GFR calc non Af Amer: >60 mL/min (ref >60)
GLUCOSE: 114 mg/dL — AB (ref 65–99)
Potassium: 3.9 mmol/L (ref 3.5–5.1)
Sodium: 135 mmol/L (ref 135–145)

## 2015-07-30 LAB — CK TOTAL AND CKMB (NOT AT ARMC)
CK TOTAL: 284 U/L (ref 49–397)
CK, MB: 13.4 ng/mL — ABNORMAL HIGH (ref 0.5–5.0)
CK, MB: 9.4 ng/mL — AB (ref 0.5–5.0)
RELATIVE INDEX: 3.3 — AB (ref 0.0–2.5)
Relative Index: 4.7 — ABNORMAL HIGH (ref 0.0–2.5)
Total CK: 284 U/L (ref 49–397)

## 2015-07-30 LAB — GLUCOSE, CAPILLARY
GLUCOSE-CAPILLARY: 102 mg/dL — AB (ref 65–99)
GLUCOSE-CAPILLARY: 103 mg/dL — AB (ref 65–99)
GLUCOSE-CAPILLARY: 105 mg/dL — AB (ref 65–99)
GLUCOSE-CAPILLARY: 107 mg/dL — AB (ref 65–99)
GLUCOSE-CAPILLARY: 110 mg/dL — AB (ref 65–99)
GLUCOSE-CAPILLARY: 110 mg/dL — AB (ref 65–99)
GLUCOSE-CAPILLARY: 112 mg/dL — AB (ref 65–99)
GLUCOSE-CAPILLARY: 131 mg/dL — AB (ref 65–99)
GLUCOSE-CAPILLARY: 170 mg/dL — AB (ref 65–99)
Glucose-Capillary: 103 mg/dL — ABNORMAL HIGH (ref 65–99)
Glucose-Capillary: 111 mg/dL — ABNORMAL HIGH (ref 65–99)
Glucose-Capillary: 113 mg/dL — ABNORMAL HIGH (ref 65–99)
Glucose-Capillary: 115 mg/dL — ABNORMAL HIGH (ref 65–99)
Glucose-Capillary: 117 mg/dL — ABNORMAL HIGH (ref 65–99)
Glucose-Capillary: 118 mg/dL — ABNORMAL HIGH (ref 65–99)
Glucose-Capillary: 123 mg/dL — ABNORMAL HIGH (ref 65–99)
Glucose-Capillary: 133 mg/dL — ABNORMAL HIGH (ref 65–99)

## 2015-07-30 LAB — PREPARE PLATELET PHERESIS: UNIT DIVISION: 0

## 2015-07-30 LAB — CBC
HEMATOCRIT: 31.8 % — AB (ref 39.0–52.0)
HEMOGLOBIN: 10.6 g/dL — AB (ref 13.0–17.0)
MCH: 32.3 pg (ref 26.0–34.0)
MCHC: 33.3 g/dL (ref 30.0–36.0)
MCV: 97 fL (ref 78.0–100.0)
Platelets: 133 10*3/uL — ABNORMAL LOW (ref 150–400)
RBC: 3.28 MIL/uL — ABNORMAL LOW (ref 4.22–5.81)
RDW: 13.8 % (ref 11.5–15.5)
WBC: 7.9 10*3/uL (ref 4.0–10.5)

## 2015-07-30 LAB — TROPONIN I
TROPONIN I: 0.7 ng/mL — AB (ref <0.031)
TROPONIN I: 1.18 ng/mL — AB (ref <0.031)

## 2015-07-30 LAB — MAGNESIUM: Magnesium: 2.2 mg/dL (ref 1.7–2.4)

## 2015-07-30 MED ORDER — METOPROLOL TARTRATE 12.5 MG HALF TABLET
12.5000 mg | ORAL_TABLET | Freq: Two times a day (BID) | ORAL | Status: DC
Start: 2015-07-30 — End: 2015-08-02
  Administered 2015-07-30 – 2015-08-02 (×7): 12.5 mg via ORAL
  Filled 2015-07-30 (×8): qty 1

## 2015-07-30 MED ORDER — FAMOTIDINE 20 MG PO TABS
20.0000 mg | ORAL_TABLET | Freq: Two times a day (BID) | ORAL | Status: DC
  Administered 2015-07-30 – 2015-08-02 (×7): 20 mg via ORAL
  Filled 2015-07-30 (×7): qty 1

## 2015-07-30 MED ORDER — PNEUMOCOCCAL VAC POLYVALENT 25 MCG/0.5ML IJ INJ
0.5000 mL | INJECTION | INTRAMUSCULAR | Status: AC
  Administered 2015-08-01: 0.5 mL via INTRAMUSCULAR
  Filled 2015-07-30: qty 0.5

## 2015-07-30 MED ORDER — INFLUENZA VAC SPLIT QUAD 0.5 ML IM SUSY: 0.5000 mL | PREFILLED_SYRINGE | INTRAMUSCULAR | Status: DC

## 2015-07-30 MED ORDER — SODIUM CHLORIDE 0.9 % IJ SOLN: 3.0000 mL | INTRAMUSCULAR | Status: DC | PRN

## 2015-07-30 MED ORDER — MOVING RIGHT ALONG BOOK
Freq: Once | Status: AC
  Administered 2015-07-30: 1
  Filled 2015-07-30: qty 1

## 2015-07-30 MED ORDER — INSULIN ASPART 100 UNIT/ML ~~LOC~~ SOLN: 0.0000 [IU] | SUBCUTANEOUS | Status: DC

## 2015-07-30 MED ORDER — ACETAMINOPHEN 325 MG PO TABS: 650.0000 mg | ORAL_TABLET | Freq: Four times a day (QID) | ORAL | Status: DC | PRN

## 2015-07-30 MED ORDER — INSULIN ASPART 100 UNIT/ML ~~LOC~~ SOLN
0.0000 [IU] | Freq: Three times a day (TID) | SUBCUTANEOUS | Status: DC
  Administered 2015-07-30 – 2015-08-02 (×7): 2 [IU] via SUBCUTANEOUS

## 2015-07-30 MED ORDER — OXYCODONE HCL 5 MG PO TABS
5.0000 mg | ORAL_TABLET | ORAL | Status: DC | PRN
  Administered 2015-07-30: 5 mg via ORAL
  Administered 2015-07-30 – 2015-07-31 (×2): 10 mg via ORAL
  Filled 2015-07-30 (×3): qty 2
  Filled 2015-07-30: qty 1

## 2015-07-30 MED ORDER — FUROSEMIDE 40 MG PO TABS
40.0000 mg | ORAL_TABLET | Freq: Every day | ORAL | Status: AC
  Administered 2015-07-31 – 2015-08-01 (×2): 40 mg via ORAL
  Filled 2015-07-30 (×2): qty 1

## 2015-07-30 MED ORDER — POTASSIUM CHLORIDE CRYS ER 20 MEQ PO TBCR: 20.0000 meq | EXTENDED_RELEASE_TABLET | Freq: Two times a day (BID) | ORAL | Status: DC

## 2015-07-30 MED ORDER — ENOXAPARIN SODIUM 40 MG/0.4ML ~~LOC~~ SOLN
40.0000 mg | Freq: Every day | SUBCUTANEOUS | Status: DC
  Administered 2015-07-30 – 2015-08-01 (×3): 40 mg via SUBCUTANEOUS
  Filled 2015-07-30 (×4): qty 0.4

## 2015-07-30 MED ORDER — SODIUM CHLORIDE 0.9 % IJ SOLN
3.0000 mL | Freq: Two times a day (BID) | INTRAMUSCULAR | Status: DC
  Administered 2015-07-30 – 2015-08-01 (×5): 3 mL via INTRAVENOUS

## 2015-07-30 MED ORDER — BISACODYL 10 MG RE SUPP: 10.0000 mg | Freq: Every day | RECTAL | Status: DC | PRN

## 2015-07-30 MED ORDER — BISACODYL 5 MG PO TBEC
10.0000 mg | DELAYED_RELEASE_TABLET | Freq: Every day | ORAL | Status: DC | PRN
Start: 2015-07-30 — End: 2015-08-02
  Administered 2015-08-01: 10 mg via ORAL
  Filled 2015-07-30: qty 2

## 2015-07-30 MED ORDER — TRAMADOL HCL 50 MG PO TABS
50.0000 mg | ORAL_TABLET | ORAL | Status: DC | PRN
  Administered 2015-07-30 – 2015-08-02 (×10): 100 mg via ORAL
  Administered 2015-08-02: 50 mg via ORAL
  Filled 2015-07-30 (×2): qty 2
  Filled 2015-07-30: qty 1
  Filled 2015-07-30 (×8): qty 2

## 2015-07-30 MED ORDER — INSULIN DETEMIR 100 UNIT/ML ~~LOC~~ SOLN
15.0000 [IU] | Freq: Every day | SUBCUTANEOUS | Status: DC
  Administered 2015-07-30: 15 [IU] via SUBCUTANEOUS
  Filled 2015-07-30 (×2): qty 0.15

## 2015-07-30 MED ORDER — SODIUM CHLORIDE 0.9 % IV SOLN: 250.0000 mL | INTRAVENOUS | Status: DC | PRN

## 2015-07-30 MED ORDER — ASPIRIN EC 325 MG PO TBEC
325.0000 mg | DELAYED_RELEASE_TABLET | Freq: Every day | ORAL | Status: DC
  Administered 2015-07-30 – 2015-08-02 (×3): 325 mg via ORAL
  Filled 2015-07-30 (×4): qty 1

## 2015-07-30 MED ORDER — ONDANSETRON HCL 4 MG/2ML IJ SOLN: 4.0000 mg | Freq: Four times a day (QID) | INTRAMUSCULAR | Status: DC | PRN

## 2015-07-30 MED ORDER — FUROSEMIDE 10 MG/ML IJ SOLN: 40.0000 mg | Freq: Once | INTRAMUSCULAR | Status: DC

## 2015-07-30 MED ORDER — ONDANSETRON HCL 4 MG PO TABS: 4.0000 mg | ORAL_TABLET | Freq: Four times a day (QID) | ORAL | Status: DC | PRN

## 2015-07-30 MED ORDER — POTASSIUM CHLORIDE CRYS ER 20 MEQ PO TBCR: 40.0000 meq | EXTENDED_RELEASE_TABLET | Freq: Once | ORAL | Status: DC

## 2015-07-30 MED ORDER — DOCUSATE SODIUM 100 MG PO CAPS
200.0000 mg | ORAL_CAPSULE | Freq: Every day | ORAL | Status: DC
  Administered 2015-07-30 – 2015-08-02 (×4): 200 mg via ORAL
  Filled 2015-07-30 (×4): qty 2

## 2015-07-30 MED ORDER — INSULIN DETEMIR 100 UNIT/ML ~~LOC~~ SOLN: 15.0000 [IU] | Freq: Every day | SUBCUTANEOUS | Status: DC

## 2015-07-30 NOTE — Progress Notes (Signed)
1 Day Post-Op Procedure(s) (LRB): CORONARY ARTERY BYPASS GRAFTING (CABG) x 5 (LIMA to LAD, SVG to DIAGONAL, SVG SEQUENTIALLY to OM1 and OM2, SVG to OM3) with Endoscopic Vein Havesting of GREATER SAPHENOUS VEIN from RIGHT THIGH and partial LOWER LEG  (N/A) TRANSESOPHAGEAL ECHOCARDIOGRAM (TEE) (N/A) Subjective:  No complaints  Objective: Vital signs in last 24 hours: Temp:  [95.9 F (35.5 C)-98.6 F (37 C)] 98.2 F (36.8 C) (09/27 0700) Pulse Rate:  [67-94] 84 (09/27 0700) Cardiac Rhythm:  [-] Atrial paced (09/26 2200) Resp:  [9-26] 15 (09/27 0700) BP: (82-118)/(50-81) 99/62 mmHg (09/27 0700) SpO2:  [91 %-100 %] 94 % (09/27 0700) Arterial Line BP: (83-149)/(48-79) 143/62 mmHg (09/27 0700) FiO2 (%):  [40 %-50 %] 40 % (09/26 1715) Weight:  [105.6 kg (232 lb 12.9 oz)-108.1 kg (238 lb 5.1 oz)] 108.1 kg (238 lb 5.1 oz) (09/27 0500)  Hemodynamic parameters for last 24 hours: PAP: (20-43)/(6-26) 35/20 mmHg CO:  [3.6 L/min-5.2 L/min] 4.6 L/min CI:  [1.6 L/min/m2-2.3 L/min/m2] 2 L/min/m2  Intake/Output from previous day: 09/26 0701 - 09/27 0700 In: 6582 [I.V.:4885; Blood:273; NG/GT:30; IV Piggyback:1244] Out: 4745 [Urine:3535; Blood:900; Chest Tube:310] Intake/Output this shift:    General appearance: alert and cooperative Neurologic: intact Heart: regular rate and rhythm, S1, S2 normal, no murmur, click, rub or gallop Lungs: clear to auscultation bilaterally Extremities: edema mild Wound: dressing dry  Lab Results:  Recent Labs  07/29/15 1850 07/29/15 1852 07/30/15 0350  WBC 8.5  --  7.9  HGB 11.1* 11.2* 10.6*  HCT 33.6* 33.0* 31.8*  PLT 138*  --  133*   BMET:  Recent Labs  07/29/15 0653  07/29/15 1852 07/30/15 0350  NA 139  < > 140 135  K 4.5  < > 4.7 3.9  CL 105  < > 106 104  CO2 27  --   --  24  GLUCOSE 132*  < > 138* 114*  BUN 13  < > 11 8  CREATININE 1.03  < > 0.80 0.85  CALCIUM 9.5  --   --  8.0*  < > = values in this interval not displayed.  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 Assessment/Plan: S/P Procedure(s) (LRB): CORONARY ARTERY BYPASS GRAFTING (CABG) x 5 (LIMA to LAD, SVG to DIAGONAL, SVG SEQUENTIALLY to OM1 and OM2, SVG to OM3) with Endoscopic Vein Havesting of GREATER SAPHENOUS VEIN from RIGHT THIGH and partial LOWER LEG  (N/A) TRANSESOPHAGEAL ECHOCARDIOGRAM (TEE) (N/A) Mobilize Diuresis Diabetes control: no hx of diabetes but Hgb A1c preop was 7.0. Start Levemir and SSI.  d/c tubes/lines Plan for transfer to step-down: see transfer orders   LOS: 1 day    Shannon Chung 07/30/2015

## 2015-07-30 NOTE — Care Management Note (Signed)
Case Management Note  Patient Details  Name: Shannon Chung MRN: [id number] Date of Birth: July 14, 1941  Subjective/Objective:  Patient from home, independent prior to admission.  Lives with SO who will be with him 24/7 on discharge.  Two supportive daughters in room also.                 Action/Plan:   Expected Discharge Date:                  Expected Discharge Plan:  Home/Self Care  In-House Referral:     Discharge planning Services     Post Acute Care Choice:    Choice offered to:     DME Arranged:    DME Agency:     HH Arranged:    HH Agency:     Status of Service:  In process, will continue to follow  Medicare Important Message Given:    Date Medicare IM Given:    Medicare IM give by:    Date Additional Medicare IM Given:    Additional Medicare Important Message give by:     If discussed at [place] of Stay Meetings, dates discussed:    Additional Comments:  [name], RN 07/30/2015, 11:26 AM

## 2015-07-30 NOTE — Progress Notes (Signed)
07/30/2015 1410 Received transfer in to room 2w22 from 2S.  Pt is A&O, no c/o voiced.  Tele applied and CCMD notified.  Oriented to room, call light and bed.  Call bell in reach and family at bedside. [place]

## 2015-07-31 ENCOUNTER — Inpatient Hospital Stay (HOSPITAL_COMMUNITY): Payer: Medicare Other

## 2015-07-31 ENCOUNTER — Encounter (HOSPITAL_COMMUNITY): Payer: Self-pay | Admitting: General Practice

## 2015-07-31 ENCOUNTER — Encounter: Payer: Self-pay | Admitting: *Deleted

## 2015-07-31 LAB — BASIC METABOLIC PANEL
Anion gap: 7 (ref 5–15)
BUN: 8 mg/dL (ref 6–20)
CALCIUM: 8.6 mg/dL — AB (ref 8.9–10.3)
CO2: 27 mmol/L (ref 22–32)
CREATININE: 0.85 mg/dL (ref 0.61–1.24)
Chloride: 103 mmol/L (ref 101–111)
GFR calc Af Amer: >60 mL/min (ref >60)
Glucose, Bld: 141 mg/dL — ABNORMAL HIGH (ref 65–99)
POTASSIUM: 4.5 mmol/L (ref 3.5–5.1)
SODIUM: 137 mmol/L (ref 135–145)

## 2015-07-31 LAB — CBC
HCT: 32.8 % — ABNORMAL LOW (ref 39.0–52.0)
Hemoglobin: 10.9 g/dL — ABNORMAL LOW (ref 13.0–17.0)
MCH: 32.7 pg (ref 26.0–34.0)
MCHC: 33.2 g/dL (ref 30.0–36.0)
MCV: 98.5 fL (ref 78.0–100.0)
PLATELETS: 122 10*3/uL — AB (ref 150–400)
RBC: 3.33 MIL/uL — AB (ref 4.22–5.81)
RDW: 14.2 % (ref 11.5–15.5)
WBC: 9.9 10*3/uL (ref 4.0–10.5)

## 2015-07-31 LAB — CK TOTAL AND CKMB (NOT AT ARMC)
CK TOTAL: 214 U/L (ref 49–397)
CK, MB: 6.3 ng/mL — ABNORMAL HIGH (ref 0.5–5.0)
CK, MB: 5.3 ng/mL — ABNORMAL HIGH (ref 0.5–5.0)
RELATIVE INDEX: 2.7 — AB (ref 0.0–2.5)
Relative Index: 2.9 — ABNORMAL HIGH (ref 0.0–2.5)
Total CK: 197 U/L (ref 49–397)

## 2015-07-31 LAB — TROPONIN I
TROPONIN I: 0.31 ng/mL — AB (ref <0.031)
TROPONIN I: 0.41 ng/mL — AB (ref <0.031)

## 2015-07-31 LAB — GLUCOSE, CAPILLARY
GLUCOSE-CAPILLARY: 157 mg/dL — AB (ref 65–99)
GLUCOSE-CAPILLARY: 126 mg/dL — AB (ref 65–99)
GLUCOSE-CAPILLARY: 145 mg/dL — AB (ref 65–99)
Glucose-Capillary: 146 mg/dL — ABNORMAL HIGH (ref 65–99)

## 2015-07-31 LAB — TYPE AND SCREEN
ABO/RH(D): O POS
Antibody Screen: NEGATIVE

## 2015-07-31 LAB — BRAIN NATRIURETIC PEPTIDE: B NATRIURETIC PEPTIDE 5: 280.3 pg/mL — AB (ref 0.0–100.0)

## 2015-07-31 MED ORDER — POTASSIUM CHLORIDE CRYS ER 20 MEQ PO TBCR
20.0000 meq | EXTENDED_RELEASE_TABLET | Freq: Every day | ORAL | Status: DC
  Administered 2015-07-31 – 2015-08-02 (×3): 20 meq via ORAL
  Filled 2015-07-31 (×3): qty 1

## 2015-07-31 NOTE — Progress Notes (Signed)
Pt has ambulated x3 today with RW. Has concerns about bed mobility. Gave verbal instructions and demonstration. Pt still on bedrest right now after EPW pulled. Pt and wife voiced understanding and he will walk again this evening. We will f/u tomorrow to practice getting OOB from flattened position (like his bed at home). Wife has back issues and cannot help him. Encouraged IS. [place] 3:06 PM 07/31/2015

## 2015-07-31 NOTE — Progress Notes (Signed)
   07/31/15 1100  Mobility  Activity Ambulate in hall  Level of Assistance Contact guard assist, steadying assist  Assistive Device Front wheel walker  Distance Ambulated (ft) 300 ft  Ambulation Response Tolerated well  Bed Position Chair

## 2015-07-31 NOTE — Progress Notes (Signed)
   07/31/15 1800  Mobility  Activity Ambulate in hall  Level of Assistance Contact guard assist, steadying assist  Assistive Device Front wheel walker  Distance Ambulated (ft) 375 ft  Ambulation Response Tolerated well  Bed Position Chair

## 2015-07-31 NOTE — Discharge Summary (Signed)
Physician Discharge Summary       [address]       [place] [address]             [phone]    Patient ID: Shannon Chung MRN: [id number] DOB/AGE: 1941/05/04 74 y.o.  Admit date: 07/29/2015 Discharge date: 08/02/2015   Admission Diagnoses: 1. 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 History of ischemic cardiomyopathy 3. History of hypertension 4. History of dyslipidemia 5. History of tobacco abuse 6. History of mural thrombus LA(resolved with Coumadin) 7. History of OA 8. History of SCC of vocal cord (s/p radiation) 9. History of skin cancer   Discharge Diagnoses:  1. 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 History of ischemic cardiomyopathy 3. History of hypertension 4. History of dyslipidemia 5. History of tobacco abuse 6. History of mural thrombus LA(resolved with Coumadin) 7. History of OA 8. History of SCC of vocal cord (s/p radiation) 9. History of skin cancer    Procedure (s):  1. Median Sternotomy 2. Extracorporeal circulation 3. Coronary artery bypass grafting x 5   Left internal mammary graft to the LAD  SVG to diagonal  SVG to OM3  Sequential SVG to OM1 and OM2 4. Endoscopic vein harvest from the right leg by Dr. Cyndia Bent on 07/29/2015.    History of Presenting Illness: The patient is a 74 year old gentleman with hypertension, and dyslipidemia who lived in [place] until this year. He says that he was noted by his previous physician in [place] to have an abnormal ECG but was asymptomatic;therefore, he underwent a PET viability study in July 2015 that showed scar in the periapical region with no ischemia and an EF of 45%. There was mural thrombus and he was treated with coumadin. A follow up echo in Nov 2015 showed an EF of 45% with no residual thrombus. He was seen by Dr. Rockey Situ in March 2016 to establish care and was asymptomatic. He was continued on medical therapy. He has severe DJD of both knees and needs to have a knee  replacement. He underwent a CT cardiac scoring which was more than 1100 and therefore a cath was performed on 07/05/2015. Results showed severe multi-vessel CAD with an occluded LAD, occluded RCA and 60-70% mid to distal LM stenosis. LVEF was moderately depressed with akinesis of the apical anterior, apical and apical inferior walls. He continues to deny any symptoms of chest pain or shortness of breath but says he does get tired at the end of the day and can take a nap any afternoon.  Dr. Cyndia Bent personally reviewed his cardiac cath films and his echo reports from [place].He had significant left main and severe multi-vessel coronary artery disease with an occluded LAD and RCA, moderately depressed EF due to prior MI's that have occurred with no symptoms noticed by the patient. Dr. Cyndia Bent agreed that CABG is indicated for preservation of myocardium especially with his degree of disease and never having any noticeable symptoms. He will be able to undergo knee replacement after he recovers from this surgery. Potential risks, benefits, and complications were discussed with the patient and he agreed to proceed with surgery. Pre operative carotid duplex showed no significant internal carotid artery stenosis bilaterally. He underwent a CABG x 5 on 07/29/2015.    Brief Hospital Course:  The patient was extubated the evening of surgery without difficulty.  He remained afebrile and hemodynamically stable. Gordy Councilman, a line, chest tubes, and foley were removed early in the post operative course. Lopressor was  started and titrated accordingly. He was volume overloaded and diuresed. He had ABL anemia. He did not require a post op transfusion. His last H and H was 10.9 and 32.8. He was weaned off the insulin drip. The patient's glucose remained well controlled.  The patient's HGA1C pre op was 7. He will need close follow up with his medical doctor after discharge for further management of his newly diagnosed diabetes.  The  patient was felt surgically stable for transfer from the ICU to PCTU for further convalescence on 07/30/2015. He continues to progress with cardiac rehab. He is ambulating on room air. He has been tolerating a diet and has had a bowel movement.  Epicardial pacing wires were removed on 09/28.   Follow up ECHO did not show evidence of Mural Thrombus, so patient will not need Coumadin at discharge.  Chest tube sutures will be removed in the office after discharge. The patient is felt surgically stable for discharge today.   Latest Vital Signs: Blood pressure 118/69, pulse 91, temperature 99.1 F (37.3 C), temperature source Oral, resp. rate 16, height 6\' 1"  (1.854 m), weight 235 lb (106.595 kg), SpO2 93 %.  Physical Exam: Cardiovascular: RRR Pulmonary: Slightly diminished at bases; no rales, wheezes, or rhonchi. Abdomen: Soft, non tender, bowel sounds present. Extremities: Mild bilateral lower extremity edema. Ecchymosis right thigh Wounds: Clean and dry. No erythema or signs of infection.   Discharge Condition:Stable condition and discharged to home  Recent laboratory studies:  Lab Results  Component Value Date   WBC 9.9 07/31/2015   HGB 10.9* 07/31/2015   HCT 32.8* 07/31/2015   MCV 98.5 07/31/2015   PLT 122* 07/31/2015   Lab Results  Component Value Date   NA 137 07/31/2015   K 4.5 07/31/2015   CL 103 07/31/2015   CO2 27 07/31/2015   CREATININE 0.85 07/31/2015   GLUCOSE 141* 07/31/2015      Diagnostic Studies: Dg Chest 2 View  07/31/2015   CLINICAL DATA:  Status post CABG 07/29/2015.  EXAM: CHEST  2 VIEW  COMPARISON:  Single view of the chest 07/30/2015.  FINDINGS: Left chest tube, mediastinal drain and right IJ approach Swan-Ganz catheter have all been removed. Mild subsegmental atelectasis is seen in the lung bases. The lungs are otherwise clear. Trace bilateral pleural effusions are seen. The patient is status post CABG with 7 intact median sternotomy wires identified. There  is cardiomegaly but no edema.  IMPRESSION: Status post removal of support apparatus. Negative for pneumothorax.  Mild subsegmental atelectasis in the lung bases and trace bilateral pleural effusions.   Electronically Signed   By: Inge Rise M.D.   On: 07/31/2015 07:36      Discharge Medications:   Medication List    STOP taking these medications        warfarin 5 MG tablet  Commonly known as:  COUMADIN      TAKE these medications        acetaminophen 500 MG tablet  Commonly known as:  TYLENOL  Take 500 mg by mouth every 6 (six) hours as needed for mild pain.     aspirin 325 MG EC tablet  Take 1 tablet (325 mg total) by mouth daily.     fluticasone 50 MCG/ACT nasal spray  Commonly known as:  FLONASE  Place 1 spray into both nostrils as needed for allergies.     furosemide 40 MG tablet  Commonly known as:  LASIX  Take 1 tablet (40 mg total) by  mouth daily. For 7 Days     lansoprazole 30 MG capsule  Commonly known as:  PREVACID  Take 30 mg by mouth every morning.     lisinopril 2.5 MG tablet  Commonly known as:  PRINIVIL,ZESTRIL  Take 1 tablet (2.5 mg total) by mouth daily.     meloxicam 15 MG tablet  Commonly known as:  MOBIC  Take 7.5 mg by mouth once a week. Saturday or sunday     metoprolol succinate 25 MG 24 hr tablet  Commonly known as:  TOPROL-XL  Take 1 tablet (25 mg total) by mouth daily.     oxyCODONE 5 MG immediate release tablet  Commonly known as:  Oxy IR/ROXICODONE  Take 1-2 tablets (5-10 mg total) by mouth every 3 (three) hours as needed for severe pain.     potassium chloride SA 20 MEQ tablet  Commonly known as:  K-DUR,KLOR-CON  Take 1 tablet (20 mEq total) by mouth daily.     ranitidine 150 MG tablet  Commonly known as:  ZANTAC  Take 150 mg by mouth at bedtime.     simvastatin 40 MG tablet  Commonly known as:  ZOCOR  Take 1 tablet (40 mg total) by mouth daily at 6 PM.     traMADol 50 MG tablet  Commonly known as:  ULTRAM  Take 1-2  tablets (50-100 mg total) by mouth every 4 (four) hours as needed for moderate pain.       The patient has been discharged on:   1.Beta Blocker:  Yes [  x ]                              No   [   ]                              If No, reason:  2.Ace Inhibitor/ARB: Yes [ x  ]                                     No  [   ]                                     If No, reason:  3.Statin:   Yes [  x ]                  No  [   ]                  If No, reason:  4.Ecasa:  Yes  [ x  ]                  No   [   ]                  If No, reason:    Follow Up Appointments: Follow-up Information    Follow up with Ida Rogue, MD On 08/21/2015.   Specialty:  Cardiology   Why:  Appointment time is at 10:20 am   Contact information:   [place] [address] [phone]       Follow up with [name], MD On 09/04/2015.   Specialty:  Cardiothoracic Surgery   Why:  PA/LAT CXR to be taken (at [facility] which is in the same building as Dr. Vivi Martens office) on 09/04/2015 at 11:15 am;Appointment time is at 12:00 pm   Contact information:   [place] [address] [phone]       Follow up with Ria Bush, MD.   Specialty:  Family Medicine   Why:  Call for a follow up appointment regarding further treatment of newly diagnosed diabetes (HGA1C 7)   Contact information:   [place] [address] [phone]       Follow up with Nurse On 08/09/2015.   Why:  Appointment is with nurse only to have chest tube sutures removed. Appointment time is at 11:45 am   Contact information:   [address] [place] [address] [phone]      Signed: Cinda Quest 08/02/2015, 8:53 AM

## 2015-07-31 NOTE — Progress Notes (Signed)
EPWs DC'd per order and unit protocol.  Pt tolerated very well, all tips intact, sites painted.  Pt understands bedrest for 1 hr.  CCMD notified, VSS,tho requiring supplemental O2 to keep sat >90 while dozing.  CT sutures sites left open to air. will monitor closely.

## 2015-07-31 NOTE — Progress Notes (Addendum)
      [address]       [place] [address]             [phone]        2 Days Post-Op Procedure(s) (LRB): CORONARY ARTERY BYPASS GRAFTING (CABG) x 5 (LIMA to LAD, SVG to DIAGONAL, SVG SEQUENTIALLY to OM1 and OM2, SVG to OM3) with Endoscopic Vein Havesting of GREATER SAPHENOUS VEIN from RIGHT THIGH and partial LOWER LEG  (N/A) TRANSESOPHAGEAL ECHOCARDIOGRAM (TEE) (N/A)  Subjective: He is passing flatus but no bowel movement yet. He has no other complaints  Objective: Vital signs in last 24 hours: Temp:  [97.7 F (36.5 C)-99.6 F (37.6 C)] 99.6 F (37.6 C) (09/28 0345) Pulse Rate:  [77-85] 85 (09/28 0345) Cardiac Rhythm:  [-] Normal sinus rhythm (09/27 1950) Resp:  [15-26] 18 (09/28 0345) BP: (116-133)/(66-77) 129/75 mmHg (09/28 0345) SpO2:  [92 %-98 %] 93 % (09/28 0345) Arterial Line BP: (157)/(67) 157/67 mmHg (09/27 0800) Weight:  [235 lb 1.6 oz (106.641 kg)] 235 lb 1.6 oz (106.641 kg) (09/28 0216)  Pre op weight 105 kg Current Weight  07/31/15 235 lb 1.6 oz (106.641 kg)    Hemodynamic parameters for last 24 hours: PAP: (32)/(17) 32/17 mmHg  Intake/Output from previous day: 09/27 0701 - 09/28 0700 In: 67.1 [P.O.:60; I.V.:7.1] Out: 725 [Urine:675; Chest Tube:50]   Physical Exam:  Cardiovascular: RRR Pulmonary: Slightly diminished at bases; no rales, wheezes, or rhonchi. Abdomen: Soft, non tender, bowel sounds present. Extremities: Mild bilateral lower extremity edema. Ecchymosis right thigh Wounds: Clean and dry.  No erythema or signs of infection.  Lab Results: CBC: Recent Labs  07/30/15 0350 07/31/15 0006  WBC 7.9 9.9  HGB 10.6* 10.9*  HCT 31.8* 32.8*  PLT 133* 122*   BMET:  Recent Labs  07/30/15 0350 07/31/15 0006  NA 135 137  K 3.9 4.5  CL 104 103  CO2 24 27  GLUCOSE 114* 141*  BUN 8 8  CREATININE 0.85 0.85  CALCIUM 8.0* 8.6*    PT/INR:  Lab Results  Component Value Date   INR 1.48 07/29/2015   INR 1.16  07/29/2015   INR 2.41* 07/25/2015   ABG:  INR: Will add last result for INR, ABG once components are confirmed Will add last 4 CBG results once components are confirmed  Assessment/Plan:  1. CV - SR in the 80's. On Lopressor 12.5 mg bid 2.  Pulmonary - On room air.CXR this am shows no pneumothorax, small pleural effusions, cardiomegaly.  Encourage incentive spirometer 3. Volume Overload - On Lasix 40 mg daily 4.  Acute blood loss anemia - H and H stable at 10.9 and 32.8 5. Mild thrombocytopenia-platelets slightly decreased to 122,000 6. Low grade fever 99.6 this am. No sign of wound infection. Likely atelectasis 7. CBGs 117/123/146. On Insulin. Pre op HGA1C 7. Will stop scheduled Insulin. Will discuss with Dr. Cyndia Bent if should try low dose Metformin as is newly diagnosed diabetic or just follow up with medical doctor after discharge. 8. Remove EPW 9. Possible discharge in 1-2 days  ZIMMERMAN,DONIELLE MPA-C 07/31/2015,7:48 AM

## 2015-08-01 DIAGNOSIS — I2510 Atherosclerotic heart disease of native coronary artery without angina pectoris: Secondary | ICD-10-CM | POA: Diagnosis not present

## 2015-08-01 LAB — CK TOTAL AND CKMB (NOT AT ARMC)
CK TOTAL: 131 U/L (ref 49–397)
CK, MB: 5.9 ng/mL — ABNORMAL HIGH (ref 0.5–5.0)
RELATIVE INDEX: 4.5 — AB (ref 0.0–2.5)

## 2015-08-01 LAB — GLUCOSE, CAPILLARY
GLUCOSE-CAPILLARY: 126 mg/dL — AB (ref 65–99)
GLUCOSE-CAPILLARY: 155 mg/dL — AB (ref 65–99)
Glucose-Capillary: 105 mg/dL — ABNORMAL HIGH (ref 65–99)
Glucose-Capillary: 122 mg/dL — ABNORMAL HIGH (ref 65–99)

## 2015-08-01 LAB — TROPONIN I: TROPONIN I: 0.22 ng/mL — AB (ref <0.031)

## 2015-08-01 MED ORDER — LISINOPRIL 2.5 MG PO TABS
2.5000 mg | ORAL_TABLET | Freq: Every day | ORAL | Status: DC
  Administered 2015-08-01 – 2015-08-02 (×2): 2.5 mg via ORAL
  Filled 2015-08-01 (×2): qty 1

## 2015-08-01 NOTE — Progress Notes (Signed)
   08/01/15 1100  Mobility  Activity Ambulate in hall  Level of Assistance Modified independent, requires aide device or extra time  Assistive Device Front wheel walker  Distance Ambulated (ft) 690 ft  Ambulation Response Tolerated well  Bed Position Chair

## 2015-08-01 NOTE — Care Management Important Message (Signed)
Important Message  Patient Details  Name: Shannon Chung MRN: [id number] Date of Birth: [birth year]   Medicare Important Message Given:  Yes-second notification given    Nathen May 08/01/2015, 12:02 PM

## 2015-08-01 NOTE — Progress Notes (Signed)
   08/01/15 1600  Mobility  Activity Ambulate in hall  Level of Assistance Independent after set-up  Assistive Device Front wheel walker  Distance Ambulated (ft) 350 ft  Ambulation Response Tolerated well  Bed Position Chair

## 2015-08-01 NOTE — Progress Notes (Signed)
CARDIAC REHAB PHASE I   PRE:  Rate/Rhythm: 88 SR    BP: sitting     SaO2: 95 RA  MODE:  Ambulation: 600 ft   POST:  Rate/Rhythm: 97 SR    BP: sitting 137/89     SaO2: 95 RA  Pt coming out of BR, still trying to have BM. Able to walk without assist. Tried without RW for 150 ft but steadier and more secure with it. Will need one for home. No other c/o. Pt deferred practicing bed mobility due to needing to use BR again. Will f/u am. [phone]   Josephina Shih [place] CES, ACSM 08/01/2015 1:52 PM

## 2015-08-01 NOTE — Progress Notes (Addendum)
       [address]       [place] [address]             [phone]          3 Days Post-Op Procedure(s) (LRB): CORONARY ARTERY BYPASS GRAFTING (CABG) x 5 (LIMA to LAD, SVG to DIAGONAL, SVG SEQUENTIALLY to OM1 and OM2, SVG to OM3) with Endoscopic Vein Havesting of GREATER SAPHENOUS VEIN from RIGHT THIGH and partial LOWER LEG  (N/A) TRANSESOPHAGEAL ECHOCARDIOGRAM (TEE) (N/A)  Subjective: Feels well, still has not had a BM. Breathing stable. No new issues.   Objective: Vital signs in last 24 hours: Patient Vitals for the past 24 hrs:  BP Temp Temp src Pulse Resp SpO2 Weight  08/01/15 0515 106/73 mmHg 98.3 F (36.8 C) Oral 90 20 95 % 235 lb (106.595 kg)  07/31/15 2026 130/79 mmHg 98.8 F (37.1 C) Oral 88 18 95 % -  07/31/15 1500 129/72 mmHg - - - - 97 % -  07/31/15 1445 134/80 mmHg - - - - - -  07/31/15 1430 124/68 mmHg - - - - - -  07/31/15 1415 126/71 mmHg - - - - - -  07/31/15 1405 - - - - - 95 % -  07/31/15 1401 118/68 mmHg - - - - 90 % -  07/31/15 1345 123/74 mmHg 98.8 F (37.1 C) Oral 78 18 91 % -   Current Weight  08/01/15 235 lb (106.595 kg)  BASELINE WEIGHT: 105 kg   Intake/Output from previous day: 09/28 0701 - 09/29 0700 In: 720 [P.O.:720] Out: 100 [Urine:100]  CBGs 145-126-155   PHYSICAL EXAM:  Heart: RRR Lungs: Clear Wound: Clean and dry Extremities: Mild LE edema    Lab Results: CBC: Recent Labs  07/30/15 0350 07/31/15 0006  WBC 7.9 9.9  HGB 10.6* 10.9*  HCT 31.8* 32.8*  PLT 133* 122*   BMET:  Recent Labs  07/30/15 0350 07/31/15 0006  NA 135 137  K 3.9 4.5  CL 104 103  CO2 24 27  GLUCOSE 114* 141*  BUN 8 8  CREATININE 0.85 0.85  CALCIUM 8.0* 8.6*    PT/INR:  Recent Labs  07/29/15 1306  LABPROT 18.0*  INR 1.48      Assessment/Plan: S/P Procedure(s) (LRB): CORONARY ARTERY BYPASS GRAFTING (CABG) x 5 (LIMA to LAD, SVG to DIAGONAL, SVG SEQUENTIALLY to OM1 and OM2, SVG to OM3) with Endoscopic Vein  Havesting of GREATER SAPHENOUS VEIN from RIGHT THIGH and partial LOWER LEG  (N/A) TRANSESOPHAGEAL ECHOCARDIOGRAM (TEE) (N/A)  CV- SR, BPs stable. Continue Lopressor, will resume home Lisinopril 2.5 mg daily  Vol overload- diurese.  Endocrine - A1C=7.0. Off Levemir.  Will need OP follow up with primary MD.  GI- LOC today.  Hopefully home 1-2 days if he continues to progress.   LOS: 3 days    COLLINS,GINA H 08/01/2015   Chart reviewed, patient examined, agree with above. He feels great and is walking well. No BM yet but thinks he is close. Plan home tomorrow if bowels move and no other issues.

## 2015-08-02 LAB — CK TOTAL AND CKMB (NOT AT ARMC)
CK, MB: 4.5 ng/mL (ref 0.5–5.0)
Relative Index: 3.9 — ABNORMAL HIGH (ref 0.0–2.5)
Total CK: 115 U/L (ref 49–397)

## 2015-08-02 LAB — GLUCOSE, CAPILLARY
GLUCOSE-CAPILLARY: 123 mg/dL — AB (ref 65–99)
Glucose-Capillary: 97 mg/dL (ref 65–99)

## 2015-08-02 LAB — TROPONIN I: Troponin I: 0.09 ng/mL — ABNORMAL HIGH (ref <0.031)

## 2015-08-02 MED ORDER — ASPIRIN 325 MG PO TBEC: 325.0000 mg | DELAYED_RELEASE_TABLET | Freq: Every day | ORAL | Status: DC

## 2015-08-02 MED ORDER — POTASSIUM CHLORIDE CRYS ER 20 MEQ PO TBCR: 20.0000 meq | EXTENDED_RELEASE_TABLET | Freq: Every day | ORAL | Status: DC

## 2015-08-02 MED ORDER — FUROSEMIDE 40 MG PO TABS: 40.0000 mg | ORAL_TABLET | Freq: Every day | ORAL | Status: DC

## 2015-08-02 MED ORDER — TRAMADOL HCL 50 MG PO TABS: 50.0000 mg | ORAL_TABLET | ORAL | Status: DC | PRN

## 2015-08-02 MED ORDER — OXYCODONE HCL 5 MG PO TABS: 5.0000 mg | ORAL_TABLET | ORAL | Status: DC | PRN

## 2015-08-02 MED ORDER — ASPIRIN EC 81 MG PO TBEC: 81.0000 mg | DELAYED_RELEASE_TABLET | Freq: Every day | ORAL | Status: DC

## 2015-08-02 NOTE — Progress Notes (Signed)
4 Days Post-Op Procedure(s) (LRB): CORONARY ARTERY BYPASS GRAFTING (CABG) x 5 (LIMA to LAD, SVG to DIAGONAL, SVG SEQUENTIALLY to OM1 and OM2, SVG to OM3) with Endoscopic Vein Havesting of GREATER SAPHENOUS VEIN from RIGHT THIGH and partial LOWER LEG  (N/A) TRANSESOPHAGEAL ECHOCARDIOGRAM (TEE) (N/A) Subjective:  No complaints, slept well, had BM this am.  Objective: Vital signs in last 24 hours: Temp:  [99 F (37.2 C)-99.1 F (37.3 C)] 99.1 F (37.3 C) (09/29 2155) Pulse Rate:  [84-91] 91 (09/29 2155) Cardiac Rhythm:  [-] Sinus tachycardia (09/30 0750) Resp:  [16-17] 16 (09/29 2155) BP: (118-137)/(69-89) 118/69 mmHg (09/29 2155) SpO2:  [93 %-94 %] 93 % (09/29 2155)  Hemodynamic parameters for last 24 hours:    Intake/Output from previous day: 09/29 0701 - 09/30 0700 In: 480 [P.O.:480] Out: 150 [Urine:150] Intake/Output this shift:    General appearance: alert and cooperative Heart: regular rate and rhythm, S1, S2 normal, no murmur, click, rub or gallop Lungs: clear to auscultation bilaterally Extremities: extremities normal, atraumatic, no cyanosis or edema Wound: incisions ok  Lab Results:  Recent Labs  07/31/15 0006  WBC 9.9  HGB 10.9*  HCT 32.8*  PLT 122*   BMET:  Recent Labs  07/31/15 0006  NA 137  K 4.5  CL 103  CO2 27  GLUCOSE 141*  BUN 8  CREATININE 0.85  CALCIUM 8.6*    PT/INR: No results for input(s): LABPROT, INR in the last 72 hours. ABG    Component Value Date/Time   PHART 7.333* 07/29/2015 1857   HCO3 24.0 07/29/2015 1857   TCO2 25 07/29/2015 1857   ACIDBASEDEF 2.0 07/29/2015 1857   O2SAT 96.0 07/29/2015 1857   CBG (last 3)   Recent Labs  08/01/15 1621 08/01/15 2147 08/02/15 0621  GLUCAP 105* 122* 123*    Assessment/Plan: S/P Procedure(s) (LRB): CORONARY ARTERY BYPASS GRAFTING (CABG) x 5 (LIMA to LAD, SVG to DIAGONAL, SVG SEQUENTIALLY to OM1 and OM2, SVG to OM3) with Endoscopic Vein Havesting of GREATER SAPHENOUS VEIN  from RIGHT THIGH and partial LOWER LEG  (N/A) TRANSESOPHAGEAL ECHOCARDIOGRAM (TEE) (N/A)  He is doing well. Will plan to send home today. Chest tube sutures out in the office in a week.   LOS: 4 days    [name] 08/02/2015

## 2015-08-02 NOTE — Progress Notes (Signed)
CARDIAC REHAB PHASE I   Ed completed with pt and s.o. Voiced understanding, good reception, ready for change. Interested in [facility] and will send referral to [facility].  [phone]  Darrick Meigs CES, ACSM 08/02/2015 10:42 AM

## 2015-08-02 NOTE — Progress Notes (Signed)
Utilization review completed.  

## 2015-08-02 NOTE — Discharge Instructions (Signed)
Activity: 1.May walk up steps                2.No lifting more than ten pounds for four weeks.                 3.No driving for four weeks.                4.Stop any activity that causes chest pain, shortness of breath, dizziness, sweating or excessive weakness.                5.Avoid straining.                6.Continue with your breathing exercises daily.  Diet: Diabetic diet and Low fat, Low saltl diet  Wound Care: May shower.  Clean wounds with mild soap and water daily. Contact the office at [phone] if any problems arise.  Coronary Artery Bypass Grafting, Care After Refer to this sheet in the next few weeks. These instructions provide you with information on caring for yourself after your procedure. Your health care provider may also give you more specific instructions. Your treatment has been planned according to current medical practices, but problems sometimes occur. Call your health care provider if you have any problems or questions after your procedure. WHAT TO EXPECT AFTER THE PROCEDURE Recovery from surgery will be different for everyone. Some people feel well after 3 or 4 weeks, while for others it takes longer. After your procedure, it is typical to have the following:  Nausea and a lack of appetite.   Constipation.  Weakness and fatigue.   Depression or irritability.   Pain or discomfort at your incision site. HOME CARE INSTRUCTIONS  Take medicines only as directed by your health care provider. Do not stop taking medicines or start any new medicines without first checking with your health care provider.  Take your pulse as directed by your health care provider.  Perform deep breathing as directed by your health care provider. If you were given a device called an incentive spirometer, use it to practice deep breathing several times a day. Support your chest with a pillow or your arms when you take deep breaths or cough.  Keep incision areas clean, dry, and  protected. Remove or change any bandages (dressings) only as directed by your health care provider. You may have skin adhesive strips over the incision areas. Do not take the strips off. They will fall off on their own.  Check incision areas daily for any swelling, redness, or drainage.  If incisions were made in your legs, do the following:  Avoid crossing your legs.   Avoid sitting for long periods of time. Change positions every 30 minutes.   Elevate your legs when you are sitting.  Wear compression stockings as directed by your health care provider. These stockings help keep blood clots from forming in your legs.  Take showers once your health care provider approves. Until then, only take sponge baths. Pat incisions dry. Do not rub incisions with a washcloth or towel. Do not take baths, swim, or use a hot tub until your health care provider approves.  Eat foods that are high in fiber, such as raw fruits and vegetables, whole grains, beans, and nuts. Meats should be lean cut. Avoid canned, processed, and fried foods.  Drink enough fluid to keep your urine clear or pale yellow.  Weigh yourself every day. This helps identify if you are retaining fluid that may make your heart and lungs  work harder.  Rest and limit activity as directed by your health care provider. You may be instructed to:  Stop any activity at once if you have chest pain, shortness of breath, irregular heartbeats, or dizziness. Get help right away if you have any of these symptoms.  Move around frequently for short periods or take short walks as directed by your health care provider. Increase your activities gradually. You may need physical therapy or cardiac rehabilitation to help strengthen your muscles and build your endurance.  Avoid lifting, pushing, or pulling anything heavier than 10 lb (4.5 kg) for at least 6 weeks after surgery.  Do not drive until your health care provider approves.  Ask your health  care provider when you may return to work.  Ask your health care provider when you may resume sexual activity.  Keep all follow-up visits as directed by your health care provider. This is important. SEEK MEDICAL CARE IF:  You have swelling, redness, increasing pain, or drainage at the site of an incision.  You have a fever.  You have swelling in your ankles or legs.  You have pain in your legs.   You gain 2 or more pounds (0.9 kg) a day.  You are nauseous or vomit.  You have diarrhea. SEEK IMMEDIATE MEDICAL CARE IF:  You have chest pain that goes to your jaw or arms.  You have shortness of breath.   You have a fast or irregular heartbeat.   You notice a "clicking" in your breastbone (sternum) when you move.   You have numbness or weakness in your arms or legs.  You feel dizzy or light-headed.  MAKE SURE YOU:  Understand these instructions.  Will watch your condition.  Will get help right away if you are not doing well or get worse. Document Released: 05/08/2005 Document Revised: 03/05/2014 Document Reviewed: 03/28/2013 [facility] Patient Information 2015 [place], [place]. This information is not intended to replace advice given to you by your health care provider. Make sure you discuss any questions you have with your health care provider.  Diabetes Mellitus and Food It is important for you to manage your blood sugar (glucose) level. Your blood glucose level can be greatly affected by what you eat. Eating healthier foods in the appropriate amounts throughout the day at about the same time each day will help you control your blood glucose level. It can also help slow or prevent worsening of your diabetes mellitus. Healthy eating may even help you improve the level of your blood pressure and reach or maintain a healthy weight.  HOW CAN FOOD AFFECT ME? Carbohydrates Carbohydrates affect your blood glucose level more than any other type of food. Your dietitian will help  you determine how many carbohydrates to eat at each meal and teach you how to count carbohydrates. Counting carbohydrates is important to keep your blood glucose at a healthy level, especially if you are using insulin or taking certain medicines for diabetes mellitus. Alcohol Alcohol can cause sudden decreases in blood glucose (hypoglycemia), especially if you use insulin or take certain medicines for diabetes mellitus. Hypoglycemia can be a life-threatening condition. Symptoms of hypoglycemia (sleepiness, dizziness, and disorientation) are similar to symptoms of having too much alcohol.  If your health care provider has given you approval to drink alcohol, do so in moderation and use the following guidelines:  Women should not have more than one drink per day, and men should not have more than two drinks per day. One drink is equal  to:  12 oz of beer.  5 oz of wine.  1 oz of hard liquor.  Do not drink on an empty stomach.  Keep yourself hydrated. Have water, diet soda, or unsweetened iced tea.  Regular soda, juice, and other mixers might contain a lot of carbohydrates and should be counted. WHAT FOODS ARE NOT RECOMMENDED? As you make food choices, it is important to remember that all foods are not the same. Some foods have fewer nutrients per serving than other foods, even though they might have the same number of calories or carbohydrates. It is difficult to get your body what it needs when you eat foods with fewer nutrients. Examples of foods that you should avoid that are high in calories and carbohydrates but low in nutrients include:  Trans fats (most processed foods list trans fats on the Nutrition Facts label).  Regular soda.  Juice.  Candy.  Sweets, such as cake, pie, doughnuts, and cookies.  Fried foods. WHAT FOODS CAN I EAT? Have nutrient-rich foods, which will nourish your body and keep you healthy. The food you should eat also will depend on several factors,  including:  The calories you need.  The medicines you take.  Your weight.  Your blood glucose level.  Your blood pressure level.  Your cholesterol level. You also should eat a variety of foods, including:  Protein, such as meat, poultry, fish, tofu, nuts, and seeds (lean animal proteins are best).  Fruits.  Vegetables.  Dairy products, such as milk, cheese, and yogurt (low fat is best).  Breads, grains, pasta, cereal, rice, and beans.  Fats such as olive oil, trans fat-free margarine, canola oil, avocado, and olives. DOES EVERYONE WITH DIABETES MELLITUS HAVE THE SAME MEAL PLAN? Because every person with diabetes mellitus is different, there is not one meal plan that works for everyone. It is very important that you meet with a dietitian who will help you create a meal plan that is just right for you. Document Released: 07/16/2005 Document Revised: 10/24/2013 Document Reviewed: 09/15/2013 [facility] Patient Information 2015 [place], [place]. This information is not intended to replace advice given to you by your health care provider. Make sure you discuss any questions you have with your health care provider.

## 2015-08-02 NOTE — Care Management Note (Signed)
Case Management Note CM note started by Luz [facility]  Patient Details  Name: Rajesh Wyss MRN: [id number] Date of Birth: May 04, 1941  Subjective/Objective:  Patient from home, independent prior to admission.  Lives with SO who will be with him 24/7 on discharge.  Two supportive daughters in room also.                 Action/Plan:   Expected Discharge Date:      08/02/15            Expected Discharge Plan:  Home/Self Care  In-House Referral:     Discharge planning Services  CM Consult  Post Acute Care Choice:    Choice offered to:     DME Arranged:    DME Agency:     HH Arranged:    HH Agency:     Status of Service:  Completed, signed off  Medicare Important Message Given:  Yes-second notification given Date Medicare IM Given:    Medicare IM give by:    Date Additional Medicare IM Given:    Additional Medicare Important Message give by:     If discussed at [place] of Stay Meetings, dates discussed:    Additional Comments:  Dawayne Patricia, RN 08/02/2015, 9:21 AM

## 2015-08-02 NOTE — Progress Notes (Signed)
08/02/2015 12:31 PM Discharge AVS meds taken today and those due this evening reviewed.  Follow-up appointments and when to call md reviewed.  D/C IV and TELE.  Questions and concerns addressed.   D/C home per orders. [place]

## 2015-08-05 ENCOUNTER — Telehealth: Payer: Self-pay | Admitting: *Deleted

## 2015-08-05 NOTE — Telephone Encounter (Signed)
Transition Care Management Follow-up Telephone Call  Date discharged? 08/02/15   How have you been since you were released from the hospital? Feeling much better after CABG   Do you understand why you were in the hospital? yes   Do you understand the discharge instructions? yes   Where were you discharged to? Home   Items Reviewed:  Medications reviewed: yes  Allergies reviewed: yes  Dietary changes reviewed: no  Referrals reviewed: yes, cardiology   Functional Questionnaire:   Activities of Daily Living (ADLs):   He states they are independent in the following: ambulation, bathing and hygiene, feeding, continence, grooming, toileting and dressing States they require assistance with the following: None   Any transportation issues/concerns?: no   Any patient concerns? no   Confirmed importance and date/time of follow-up visits scheduled yes, 08/12/15 @ 2  Provider Appointment booked with Ria Bush, MD  Confirmed with patient if condition begins to worsen call PCP or go to the ER.  Patient was given the office number and encouraged to call back with question or concerns.  : yes

## 2015-08-08 ENCOUNTER — Encounter: Payer: Self-pay | Admitting: Family Medicine

## 2015-08-08 MED ORDER — RANITIDINE HCL 150 MG PO TABS: 150.0000 mg | ORAL_TABLET | Freq: Every day | ORAL | Status: DC

## 2015-08-09 ENCOUNTER — Ambulatory Visit (INDEPENDENT_AMBULATORY_CARE_PROVIDER_SITE_OTHER): Payer: Self-pay

## 2015-08-09 DIAGNOSIS — I2510 Atherosclerotic heart disease of native coronary artery without angina pectoris: Secondary | ICD-10-CM

## 2015-08-09 DIAGNOSIS — Z4802 Encounter for removal of sutures: Secondary | ICD-10-CM

## 2015-08-09 NOTE — Progress Notes (Signed)
Removed 3 sutures from chest tube sites with no signs of infection and patient tolerated well. 

## 2015-08-12 ENCOUNTER — Ambulatory Visit (INDEPENDENT_AMBULATORY_CARE_PROVIDER_SITE_OTHER): Payer: Medicare Other | Admitting: Family Medicine

## 2015-08-12 ENCOUNTER — Encounter: Payer: Self-pay | Admitting: Family Medicine

## 2015-08-12 VITALS — BP 116/72 | HR 76 | Temp 98.0°F | Wt 218.2 lb

## 2015-08-12 DIAGNOSIS — Z23 Encounter for immunization: Secondary | ICD-10-CM | POA: Diagnosis not present

## 2015-08-12 DIAGNOSIS — M1710 Unilateral primary osteoarthritis, unspecified knee: Secondary | ICD-10-CM

## 2015-08-12 DIAGNOSIS — E785 Hyperlipidemia, unspecified: Secondary | ICD-10-CM

## 2015-08-12 DIAGNOSIS — E119 Type 2 diabetes mellitus without complications: Secondary | ICD-10-CM | POA: Insufficient documentation

## 2015-08-12 DIAGNOSIS — I2510 Atherosclerotic heart disease of native coronary artery without angina pectoris: Secondary | ICD-10-CM | POA: Diagnosis not present

## 2015-08-12 DIAGNOSIS — M179 Osteoarthritis of knee, unspecified: Secondary | ICD-10-CM | POA: Diagnosis not present

## 2015-08-12 DIAGNOSIS — E118 Type 2 diabetes mellitus with unspecified complications: Secondary | ICD-10-CM | POA: Diagnosis not present

## 2015-08-12 DIAGNOSIS — I10 Essential (primary) hypertension: Secondary | ICD-10-CM

## 2015-08-12 DIAGNOSIS — I513 Intracardiac thrombosis, not elsewhere classified: Secondary | ICD-10-CM

## 2015-08-12 DIAGNOSIS — I213 ST elevation (STEMI) myocardial infarction of unspecified site: Secondary | ICD-10-CM | POA: Diagnosis not present

## 2015-08-12 MED ORDER — LANSOPRAZOLE 30 MG PO CPDR: 30.0000 mg | DELAYED_RELEASE_CAPSULE | ORAL | Status: DC

## 2015-08-12 NOTE — Progress Notes (Signed)
BP 116/72 mmHg  Pulse 76  Temp(Src) 98 F (36.7 C) (Oral)  Wt 218 lb 4 oz (98.998 kg)   CC: hosp f/u visit  Subjective:    Patient ID: Shannon Chung, male    DOB: June 09, 1941, 74 y.o.   MRN: [id number]  HPI: Shannon Chung is a 74 y.o. male presenting on 08/12/2015 for Follow-up   Presents with fiancee.  Established with [place] 06/13/2015. Hospitalized late last month and underwent 5v CABG with vein harvesting from R leg by Dr Cyndia Bent. Coumadin was discontinued as no further mural thrombus was seen on echo. New diagnosis of diabetes with A1c 7%. Discharge hgb 10.9.  Has f/u planned with cardiology and cardiothoracic surgery over next month. I don't see other transitional care phone calls in system so will complete TCM visit today.  Planning to undergo L knee replacement surgery when he recovers from CABG. However, with 14 lb weight loss has noted marked improvement in L knee pain.   Osteoarthritis - takes 1 tablet BID on average. Also on meloxicam 7.5mg  weekly.   1/2 tablet meloxicam once weekly for knee aches (although he's on coumadin).   Admit date: 07/29/2015 Discharge date: 08/02/2015 F/u phone call: 08/05/2015  Discharge Diagnoses:  1. 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 History of ischemic cardiomyopathy 3. History of hypertension 4. History of dyslipidemia 5. History of tobacco abuse 6. History of mural thrombus LA(resolved with Coumadin) 7. History of OA 8. History of SCC of vocal cord (s/p radiation) 9. History of skin cancer  Procedure (s):  1. Median Sternotomy 2. Extracorporeal circulation   3. Coronary artery bypass grafting x 5  Left internal mammary graft to the LAD  SVG to diagonal  SVG to OM3  Sequential SVG to OM1 and OM2   4. Endoscopic vein harvest from the right leg by Dr. Cyndia Bent on 07/29/2015.  Relevant past medical, surgical, family and social history reviewed and updated as indicated. Interim medical history since our last visit  reviewed. Allergies and medications reviewed and updated. Current Outpatient Prescriptions on File Prior to Visit  Medication Sig  . acetaminophen (TYLENOL) 500 MG tablet Take 500 mg by mouth every 6 (six) hours as needed for mild pain.   . fluticasone (FLONASE) 50 MCG/ACT nasal spray Place 1 spray into both nostrils as needed for allergies.   Marland Kitchen lisinopril (PRINIVIL,ZESTRIL) 2.5 MG tablet Take 1 tablet (2.5 mg total) by mouth daily.  . meloxicam (MOBIC) 15 MG tablet Take 7.5 mg by mouth once a week. Saturday or sunday  . metoprolol succinate (TOPROL-XL) 25 MG 24 hr tablet Take 1 tablet (25 mg total) by mouth daily.  . ranitidine (ZANTAC) 150 MG tablet Take 1 tablet (150 mg total) by mouth at bedtime.  . simvastatin (ZOCOR) 40 MG tablet Take 1 tablet (40 mg total) by mouth daily at 6 PM.   No current facility-administered medications on file prior to visit.    Review of Systems Per HPI unless specifically indicated above     Objective:    BP 116/72 mmHg  Pulse 76  Temp(Src) 98 F (36.7 C) (Oral)  Wt 218 lb 4 oz (98.998 kg)  Wt Readings from Last 3 Encounters:  08/12/15 218 lb 4 oz (98.998 kg)  08/01/15 235 lb (106.595 kg)  07/25/15 233 lb 8 oz (105.915 kg)   Body mass index is 28.8 kg/(m^2).  Physical Exam  Constitutional: He appears well-developed and well-nourished. No distress.  HENT:  Head: Normocephalic and atraumatic.  Mouth/Throat: Oropharynx  is clear and moist. No oropharyngeal exudate.  Eyes: Conjunctivae and EOM are normal. Pupils are equal, round, and reactive to light.  Neck: Normal range of motion. Neck supple.  Cardiovascular: Normal rate, regular rhythm, normal heart sounds and intact distal pulses.   No murmur heard. Regular on my exam  Pulmonary/Chest: Effort normal and breath sounds normal. No respiratory distress. He has no wheezes. He has no rales. He exhibits no tenderness.  Midline sternotomy incision healing well  Musculoskeletal: He exhibits no edema.   Some bruising and R lower leg endovascular incisions noted  Skin: Skin is warm and dry. No rash noted.  Psychiatric: He has a normal mood and affect.  Bright affect  Nursing note and vitals reviewed.   Lab Results  Component Value Date   HGBA1C 7.0* 07/25/2015    Lab Results  Component Value Date   CREATININE 0.85 07/31/2015       Assessment & Plan:   Problem List Items Addressed This Visit    Osteoarthritis    Actually significant improvement with weight loss - will continue to monitor      Relevant Medications   aspirin (ASPIRIN EC) 81 MG EC tablet   Left ventricular apical thrombus (HCC)    Off coumadin. Only on aspirin. Thrombus has resolved.      Relevant Medications   aspirin (ASPIRIN EC) 81 MG EC tablet   Hyperlipidemia    Continue simvastatin. Check FLP next fasting lab.      Relevant Medications   aspirin (ASPIRIN EC) 81 MG EC tablet   Essential hypertension    Chronic, stable. Continue current regimen.      Relevant Medications   aspirin (ASPIRIN EC) 81 MG EC tablet   Diabetes mellitus type 2, controlled, with complications ([place])    New diagnosis - discussed with patient, recommended diabetic diet and provided with pt educational handout. However, since diagnosed has lost 14 lbs and plans on losing more - undergoing weight watcher's diet.  Anticipate remaining very well controlled. Still too early to recheck A1c - return in 2 mo for medicare wellness visit and labs at that time.      Relevant Medications   aspirin (ASPIRIN EC) 81 MG EC tablet   CAD (coronary artery disease), native coronary artery - Primary    S/p 5v CABG last month. Recovering wonderfully. Has f/u already scheduled with cards and CT surgery.      Relevant Medications   aspirin (ASPIRIN EC) 81 MG EC tablet    Other Visit Diagnoses    Need for influenza vaccination        Relevant Orders    Flu Vaccine QUAD 36+ mos PF IM (Fluarix & Fluzone Quad PF) (Completed)        Follow  up plan: Return in about 2 months (around 10/12/2015), or as needed, for medicare wellness visit.

## 2015-08-12 NOTE — Assessment & Plan Note (Signed)
Continue simvastatin. Check FLP next fasting lab.

## 2015-08-12 NOTE — Assessment & Plan Note (Signed)
Actually significant improvement with weight loss - will continue to monitor

## 2015-08-12 NOTE — Patient Instructions (Addendum)
Flu shot today. Ok to continue 1/2 tablet weekly of meloxicam.  You are doing great! Space out next visit (medicare wellness visit) to December - we will recheck sugar levels at that time. Congratulations on weight loss. Look at diabetes.org for more resources Call us with questions.  Diabetes Mellitus and Food It is important for you to manage your blood sugar (glucose) level. Your blood glucose level can be greatly affected by what you eat. Eating healthier foods in the appropriate amounts throughout the day at about the same time each day will help you control your blood glucose level. It can also help slow or prevent worsening of your diabetes mellitus. Healthy eating may even help you improve the level of your blood pressure and reach or maintain a healthy weight.  General recommendations for healthful eating and cooking habits include:  Eating meals and snacks regularly. Avoid going long periods of time without eating to lose weight.  Eating a diet that consists mainly of plant-based foods, such as fruits, vegetables, nuts, legumes, and whole grains.  Using low-heat cooking methods, such as baking, instead of high-heat cooking methods, such as deep frying. Work with your dietitian to make sure you understand how to use the Nutrition Facts information on food labels. HOW CAN FOOD AFFECT ME? Carbohydrates Carbohydrates affect your blood glucose level more than any other type of food. Your dietitian will help you determine how many carbohydrates to eat at each meal and teach you how to count carbohydrates. Counting carbohydrates is important to keep your blood glucose at a healthy level, especially if you are using insulin or taking certain medicines for diabetes mellitus. Alcohol Alcohol can cause sudden decreases in blood glucose (hypoglycemia), especially if you use insulin or take certain medicines for diabetes mellitus. Hypoglycemia can be a life-threatening condition. Symptoms of  hypoglycemia (sleepiness, dizziness, and disorientation) are similar to symptoms of having too much alcohol.  If your health care provider has given you approval to drink alcohol, do so in moderation and use the following guidelines:  Women should not have more than one drink per day, and men should not have more than two drinks per day. One drink is equal to:  12 oz of beer.  5 oz of wine.  1 oz of hard liquor.  Do not drink on an empty stomach.  Keep yourself hydrated. Have water, diet soda, or unsweetened iced tea.  Regular soda, juice, and other mixers might contain a lot of carbohydrates and should be counted. WHAT FOODS ARE NOT RECOMMENDED? As you make food choices, it is important to remember that all foods are not the same. Some foods have fewer nutrients per serving than other foods, even though they might have the same number of calories or carbohydrates. It is difficult to get your body what it needs when you eat foods with fewer nutrients. Examples of foods that you should avoid that are high in calories and carbohydrates but low in nutrients include:  Trans fats (most processed foods list trans fats on the Nutrition Facts label).  Regular soda.  Juice.  Candy.  Sweets, such as cake, pie, doughnuts, and cookies.  Fried foods. WHAT FOODS CAN I EAT? Eat nutrient-rich foods, which will nourish your body and keep you healthy. The food you should eat also will depend on several factors, including:  The calories you need.  The medicines you take.  Your weight.  Your blood glucose level.  Your blood pressure level.  Your cholesterol level. You should  eat a variety of foods, including:  Protein.  Lean cuts of meat.  Proteins low in saturated fats, such as fish, egg whites, and beans. Avoid processed meats.  Fruits and vegetables.  Fruits and vegetables that may help control blood glucose levels, such as apples, mangoes, and yams.  Dairy products.  Choose  fat-free or low-fat dairy products, such as milk, yogurt, and cheese.  Grains, bread, pasta, and rice.  Choose whole grain products, such as multigrain bread, whole oats, and brown rice. These foods may help control blood pressure.  Fats.  Foods containing healthful fats, such as nuts, avocado, olive oil, canola oil, and fish. DOES EVERYONE WITH DIABETES MELLITUS HAVE THE SAME MEAL PLAN? Because every person with diabetes mellitus is different, there is not one meal plan that works for everyone. It is very important that you meet with a dietitian who will help you create a meal plan that is just right for you.   This information is not intended to replace advice given to you by your health care provider. Make sure you discuss any questions you have with your health care provider.   Document Released: 07/16/2005 Document Revised: 11/09/2014 Document Reviewed: 09/15/2013 Elsevier Interactive Patient Education [employer].

## 2015-08-12 NOTE — Progress Notes (Signed)
Pre visit review using our clinic review tool, if applicable. No additional management support is needed unless otherwise documented below in the visit note. 

## 2015-08-12 NOTE — Assessment & Plan Note (Signed)
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 Recovering wonderfully. Has f/u already scheduled with cards and CT surgery.

## 2015-08-12 NOTE — Assessment & Plan Note (Signed)
New diagnosis - discussed with patient, recommended diabetic diet and provided with pt educational handout. However, since diagnosed has lost 14 lbs and plans on losing more - undergoing weight watcher's diet.  Anticipate remaining very well controlled. Still too early to recheck A1c - return in 2 mo for medicare wellness visit and labs at that time.

## 2015-08-12 NOTE — Assessment & Plan Note (Signed)
Chronic, stable. Continue current regimen. 

## 2015-08-12 NOTE — Assessment & Plan Note (Addendum)
Off coumadin. Only on aspirin. Thrombus has resolved.

## 2015-08-21 ENCOUNTER — Encounter: Payer: Medicare Other | Admitting: Cardiovascular Disease

## 2015-08-28 ENCOUNTER — Encounter: Payer: Self-pay | Admitting: *Deleted

## 2015-08-28 ENCOUNTER — Encounter: Payer: Medicare Other | Attending: Cardiovascular Disease | Admitting: *Deleted

## 2015-08-28 VITALS — Ht 74.0 in | Wt 222.5 lb

## 2015-08-28 DIAGNOSIS — Z951 Presence of aortocoronary bypass graft: Secondary | ICD-10-CM | POA: Insufficient documentation

## 2015-08-28 NOTE — Progress Notes (Signed)
Cardiac Individual Treatment Plan  Patient Details  Name: Shannon Chung MRN: [id number] Date of Birth: Aug 03, 1941 Referring Provider:  Minna Merritts, MD  Initial Encounter Date: Date: 08/28/15  Visit Diagnosis: S/P CABG x 5  Patient's Home Medications on Admission:  Current outpatient prescriptions:  .  acetaminophen (TYLENOL) 500 MG tablet, Take 500 mg by mouth every 6 (six) hours as needed for mild pain. , Disp: , Rfl:  .  aspirin (ASPIRIN EC) 81 MG EC tablet, Take 81 mg by mouth daily. Swallow whole., Disp: , Rfl:  .  fluticasone (FLONASE) 50 MCG/ACT nasal spray, Place 1 spray into both nostrils as needed for allergies. , Disp: , Rfl:  .  lansoprazole (PREVACID) 30 MG capsule, Take 1 capsule (30 mg total) by mouth every morning., Disp: 90 capsule, Rfl: 3 .  lisinopril (PRINIVIL,ZESTRIL) 2.5 MG tablet, Take 1 tablet (2.5 mg total) by mouth daily., Disp: 90 tablet, Rfl: 3 .  meloxicam (MOBIC) 15 MG tablet, Take 7.5 mg by mouth once a week. Saturday or sunday, Disp: , Rfl: 1 .  metoprolol succinate (TOPROL-XL) 25 MG 24 hr tablet, Take 1 tablet (25 mg total) by mouth daily., Disp: 90 tablet, Rfl: 3 .  ranitidine (ZANTAC) 150 MG tablet, Take 1 tablet (150 mg total) by mouth at bedtime., Disp: 180 tablet, Rfl: 1 .  simvastatin (ZOCOR) 40 MG tablet, Take 1 tablet (40 mg total) by mouth daily at 6 PM., Disp: 90 tablet, Rfl: 3  Past Medical History: Past Medical History  Diagnosis Date  . Skin cancer     squamous and basal, sees derm regularly  . 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 [place] thrombus of cardiac apex ([facility]) 06/2014    LV; resolved with coumadin  . Ischemic cardiomyopathy     dilated, EF 35% improved to 45-50% (2015)  . History of radiation exposure     right vocal cord squamous cell cancer  . HTN (hypertension)   . Dyslipidemia   . [place]one atrial fibrillation ([place]) 1983    isolated episode  . Vitamin D deficiency   . Coronary artery disease   .  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 GERD (gastroesophageal reflux disease)   . Osteoarthritis     R-shoulder, L-knee [facility])  . Diabetes mellitus without complication ([place]) 12/9516    new onset  . S/P CABG x 5 2016    [facility]    Tobacco Use: History  Smoking status  . Former Smoker -- 0.50 packs/day for 10 years  . Types: Cigarettes  . Quit date: 11/02/1978  Smokeless tobacco  . Never Used    Labs: Recent Review Flowsheet Data    Labs for ITP Cardiac and Pulmonary Rehab Latest Ref Rng 07/29/2015 07/29/2015 07/29/2015 07/29/2015 07/29/2015   PHART 7.350 - 7.450 - 7.379 7.345(L) - 7.333(L)   PCO2ART 35.0 - 45.0 mmHg - 36.4 45.5(H) - 45.2(H)   HCO3 20.0 - 24.0 mEq/L - 21.7 24.8(H) - 24.0   TCO2 0 - 100 mmol/L 22 23 26 23 25    ACIDBASEDEF 0.0 - 2.0 mmol/L - 3.0(H) 1.0 - 2.0   O2SAT - - 91.0 95.0 - 96.0       Exercise Target Goals: Date: 08/28/15  Exercise Program Goal: Individual exercise prescription set with THRR, safety & activity barriers. Participant demonstrates ability to understand and report RPE using BORG scale, to self-measure pulse accurately, and to acknowledge the importance of  the exercise prescription.  Exercise Prescription Goal: Starting with aerobic activity 30 plus minutes a day, 3 days per week for initial exercise prescription. Provide home exercise prescription and guidelines that participant acknowledges understanding prior to discharge.  Activity Barriers & Risk Stratification:     Activity Barriers & Risk Stratification - 08/28/15 1858    Activity Barriers & Risk Stratification   Risk Stratification High      6 Minute Walk:     6 Minute Walk      08/28/15 1420       6 Minute Walk   Phase Initial     Distance 1330 feet     Walk Time 6 minutes     Resting HR 65 bpm     Resting BP 132/78 mmHg     Max Ex. HR 107 bpm     Max Ex. BP 144/60 mmHg     RPE 13     Symptoms Yes (comment)     Comments L knee pain due to  arthritis 5/10 pain        Initial Exercise Prescription:     Initial Exercise Prescription - 08/28/15 1400    Date of Initial Exercise Prescription   Date 08/28/15   Treadmill   MPH 2.2   Grade 0   Minutes 10   Bike   Level 0.4   Minutes 15   Recumbant Bike   Level 3   RPM 40   Watts 30   Minutes 15   NuStep   Level 3   Watts 30   Minutes 15   Arm Ergometer   Level 1   Watts 8   Minutes 10   Arm/Foot Ergometer   Level 4   Watts 12   Minutes 10   Cybex   Level 3   RPM 50   Minutes 10   Recumbant Elliptical   Level 1   RPM 40   Watts 10   Minutes 10   Elliptical   Level 1   Speed 3   Minutes 1   REL-XR   Level 3   Watts 40   Minutes 15   Prescription Details   Frequency (times per week) 3   Duration Progress to 30 minutes of continuous aerobic without signs/symptoms of physical distress   Intensity   THRR REST +  30   Ratings of Perceived Exertion 11-15   Progression Continue progressive overload as per policy without signs/symptoms or physical distress.   Resistance Training   Training Prescription Yes   Weight 2   Reps 10-15      Exercise Prescription Changes:   Discharge Exercise Prescription (Final Exercise Prescription Changes):   Nutrition:  Target Goals: Understanding of nutrition guidelines, daily intake of sodium 1500mg , cholesterol 200mg , calories 30% from fat and 7% or less from saturated fats, daily to have 5 or more servings of fruits and vegetables.  Biometrics:     Pre Biometrics - 08/28/15 1413    Pre Biometrics   Height 6\' 2"  (1.88 m)   Weight 222 lb 8 oz (100.925 kg)   Waist Circumference 40.25 inches   Hip Circumference 41.5 inches   Waist to Hip Ratio 0.97 %   BMI (Calculated) 28.6       Nutrition Therapy Plan and Nutrition Goals:   Nutrition Discharge: Rate Your Plate Scores:   Nutrition Goals Re-Evaluation:   Psychosocial: Target Goals: Acknowledge presence or absence of depression, maximize  coping skills, provide positive support system.  Participant is able to verbalize types and ability to use techniques and skills needed for reducing stress and depression.  Initial Review & Psychosocial Screening:     Initial Psych Review & Screening - 08/28/15 [place]? Yes   Comments Patient has fiancee, Rachel Moulds; two daughters and 5 grandchildren.  Patient is affliated with [facility].     Barriers   Psychosocial barriers to participate in program There are no identifiable barriers or psychosocial needs.  Patient stated one year ago is was really stressed when his daughter had cancer and he was helping take care of his grandchildren.  He was worried about his daughter.  She is cancer free at this time.     Screening Interventions   Interventions Encouraged to exercise      Quality of Life Scores:   PHQ-9:     Recent Review Flowsheet Data    Depression screen [facility] 2/9 08/28/2015   Decreased Interest 0   Down, Depressed, Hopeless 0   PHQ - 2 Score 0   Altered sleeping 1   Tired, decreased energy 1   Change in appetite 0   Feeling bad or failure about yourself  0   Trouble concentrating 0   Moving slowly or fidgety/restless 0   Suicidal thoughts 0   PHQ-9 Score 2   Difficult doing work/chores Not difficult at all      Psychosocial Evaluation and Intervention:   Psychosocial Re-Evaluation:   Vocational Rehabilitation: Provide vocational rehab assistance to qualifying candidates.   Vocational Rehab Evaluation & Intervention:     Vocational Rehab - 08/28/15 1900    Initial Vocational Rehab Evaluation & Intervention   Assessment shows need for Vocational Rehabilitation No      Education: Education Goals: Education classes will be provided on a weekly basis, covering required topics. Participant will state understanding/return demonstration of topics presented.  Learning Barriers/Preferences:     Learning  Barriers/Preferences - 08/28/15 1858    Learning Barriers/Preferences   Learning Barriers Sight;Exercise Concerns   Learning Preferences Written [place]      Education Topics: General Nutrition Guidelines/Fats and Fiber: -Group instruction provided by verbal, written material, models and posters to present the general guidelines for heart healthy nutrition. Gives an explanation and review of dietary fats and fiber.   Controlling Sodium/Reading Food Labels: -Group verbal and written material supporting the discussion of sodium use in heart healthy nutrition. Review and explanation with models, verbal and written materials for utilization of the food label.   Exercise Physiology & Risk Factors: - Group verbal and written instruction with models to review the exercise physiology of the cardiovascular system and associated critical values. Details cardiovascular disease risk factors and the goals associated with each risk factor.   Aerobic Exercise & Resistance Training: - Gives group verbal and written discussion on the health impact of inactivity. On the components of aerobic and resistive training programs and the benefits of this training and how to safely progress through these programs.   Flexibility, Balance, General Exercise Guidelines: - Provides group verbal and written instruction on the benefits of flexibility and balance training programs. Provides general exercise guidelines with specific guidelines to those with heart or lung disease. Demonstration and skill practice provided.   Stress Management: - Provides group verbal and written instruction about the health risks of elevated stress, cause of high stress, and healthy ways to reduce stress.   Depression: - Provides group verbal and written  instruction on the correlation between heart/lung disease and depressed mood, treatment options, and the stigmas associated with seeking treatment.   Anatomy & Physiology of  the Heart: - Group verbal and written instruction and models provide basic cardiac anatomy and physiology, with the coronary electrical and arterial systems. Review of: AMI, Angina, Valve disease, Heart Failure, Cardiac Arrhythmia, Pacemakers, and the ICD.   Cardiac Procedures: - Group verbal and written instruction and models to describe the testing methods done to diagnose heart disease. Reviews the outcomes of the test results. Describes the treatment choices: Medical Management, Angioplasty, or Coronary Bypass Surgery.   Cardiac Medications: - Group verbal and written instruction to review commonly prescribed medications for heart disease. Reviews the medication, class of the drug, and side effects. Includes the steps to properly store meds and maintain the prescription regimen.   Go Sex-Intimacy & Heart Disease, Get SMART - Goal Setting: - Group verbal and written instruction through game format to discuss heart disease and the return to sexual intimacy. Provides group verbal and written material to discuss and apply goal setting through the application of the S.M.A.R.T. Method.   Other Matters of the Heart: - Provides group verbal, written materials and models to describe Heart Failure, Angina, Valve Disease, and Diabetes in the realm of heart disease. Includes description of the disease process and treatment options available to the cardiac patient.   Exercise & Equipment Safety: - Individual verbal instruction and demonstration of equipment use and safety with use of the equipment.          Cardiac Rehab from 08/28/2015 in [facility]   Date  08/28/15   Educator  DW   Instruction Review Code  1- partially meets, needs review/practice      Infection Prevention: - Provides verbal and written material to individual with discussion of infection control including proper hand washing and proper equipment cleaning during exercise session.      Cardiac Rehab from 08/28/2015 in  [facility]   Date  08/28/15   Educator  DW   Instruction Review Code  2- meets goals/outcomes      Falls Prevention: - Provides verbal and written material to individual with discussion of falls prevention and safety.      Cardiac Rehab from 08/28/2015 in [facility] Re[facility]   Date  08/28/15   Educator  DW   Instruction Review Code  2- meets goals/outcomes      Diabetes: - Individual verbal and written instruction to review signs/symptoms of diabetes, desired ranges of glucose level fasting, after meals and with exercise. Advice that pre and post exercise glucose checks will be done for 3 sessions at entry of program.      Cardiac Rehab from 08/28/2015 in [facility] Cardiac Rehab   Date  08/28/15   Educator  DW   Instruction Review Code  2- meets goals/outcomes       Knowledge Questionnaire Score:     Knowledge Questionnaire Score - 08/28/15 1858    Knowledge Questionnaire Score   Pre Score 23/28      Personal Goals and Risk Factors at Admission:     Personal Goals and Risk Factors at Admission - 08/28/15 1902    Personal Goals and Risk Factors on Admission    Weight Management Yes   Intervention Learn and follow the exercise and diet guidelines while in the program. Utilize the nutrition and education classes to help gain knowledge of the diet and exercise expectations in the program  Admit Weight 222 lb 8 oz (100.925 kg)   Goal Weight 205 lb (92.987 kg)   Increase Aerobic Exercise and Physical Activity Yes   Intervention While in program, learn and follow the exercise prescription taught. Start at a low level workload and increase workload after able to maintain previous level for 30 minutes. Increase time before increasing intensity.   Diabetes Yes   Goal Blood glucose control identified by blood glucose values, HgbA1C. Participant verbalizes understanding of the signs/symptoms of hyper/hypo glycemia, proper foot care and importance of medication and nutrition plan  for blood glucose control.   Intervention Provide nutrition & aerobic exercise along with prescribed medications to achieve blood glucose in normal ranges: Fasting 65-99 mg/dL   Hypertension Yes   Goal Participant will see blood pressure controlled within the values of 140/53mm/Hg or within value directed by their physician.   Intervention Provide nutrition & aerobic exercise along with prescribed medications to achieve BP 140/90 or less.   Lipids Yes   Goal Cholesterol controlled with medications as prescribed, with individualized exercise RX and with personalized nutrition plan. Value goals: LDL < 70mg , HDL > 40mg . Participant states understanding of desired cholesterol values and following prescriptions.   Intervention Provide nutrition & aerobic exercise along with prescribed medications to achieve LDL 70mg , HDL >40mg .   Stress Yes   Goal To meet with psychosocial counselor for stress and relaxation information and guidance. To state understanding of performing relaxation techniques and or identifying personal stressors.   Intervention Provide education on types of stress, identifiying stressors, and ways to cope with stress. Provide demonstration and active practice of relaxation techniques.      Personal Goals and Risk Factors Review:    Personal Goals Discharge (Final Personal Goals and Risk Factors Review):     Comments:  Patient is S/P CABG x 5.  It has been a month since his surgery. Patient asked questions about the program and is ready to get started in the program after he sees his Cardiothoracic surgeon on Wednesday, September 04, 2015.  Patient to start Cardiac Rehab on Tuesday, September 10, 2015 at 0830.

## 2015-08-28 NOTE — Patient Instructions (Signed)
Patient Instructions  Patient Details  Name: Khalfani Weideman MRN: [id number] Date of Birth: 1941-03-27 Referring Provider:  Minna Merritts, MD  Below are the personal goals you chose as well as exercise and nutrition goals. Our goal is to help you keep on track towards obtaining and maintaining your goals. We will be discussing your progress on these goals with you throughout the program.  Initial Exercise Prescription:     Initial Exercise Prescription - 08/28/15 1400    Date of Initial Exercise Prescription   Date 08/28/15   Treadmill   MPH 2.2   Grade 0   Minutes 10   Bike   Level 0.4   Minutes 15   Recumbant Bike   Level 3   RPM 40   Watts 30   Minutes 15   NuStep   Level 3   Watts 30   Minutes 15   Arm Ergometer   Level 1   Watts 8   Minutes 10   Arm/Foot Ergometer   Level 4   Watts 12   Minutes 10   Cybex   Level 3   RPM 50   Minutes 10   Recumbant Elliptical   Level 1   RPM 40   Watts 10   Minutes 10   Elliptical   Level 1   Speed 3   Minutes 1   REL-XR   Level 3   Watts 40   Minutes 15   Prescription Details   Frequency (times per week) 3   Duration Progress to 30 minutes of continuous aerobic without signs/symptoms of physical distress   Intensity   THRR REST +  30   Ratings of Perceived Exertion 11-15   Progression Continue progressive overload as per policy without signs/symptoms or physical distress.   Resistance Training   Training Prescription Yes   Weight 2   Reps 10-15      Exercise Goals: Frequency: Be able to perform aerobic exercise three times per week working toward 3-5 days per week.  Intensity: Work with a perceived exertion of 11 (fairly light) - 15 (hard) as tolerated. Follow your new exercise prescription and watch for changes in prescription as you progress with the program. Changes will be reviewed with you when they are made.  Duration: You should be able to do 30 minutes of continuous aerobic exercise in  addition to a 5 minute warm-up and a 5 minute cool-down routine.  Nutrition Goals: Your personal nutrition goals will be established when you do your nutrition analysis with the dietician.  The following are nutrition guidelines to follow: Cholesterol < 200mg /day Sodium < 1500mg /day Fiber: Men over 50 yrs - 30 grams per day  Personal Goals:     Personal Goals and Risk Factors at Admission - 08/28/15 1902    Personal Goals and Risk Factors on Admission    Weight Management Yes   Intervention Learn and follow the exercise and diet guidelines while in the program. Utilize the nutrition and education classes to help gain knowledge of the diet and exercise expectations in the program   Admit Weight 222 lb 8 oz (100.925 kg)   Goal Weight 205 lb (92.987 kg)   Increase Aerobic Exercise and Physical Activity Yes   Intervention While in program, learn and follow the exercise prescription taught. Start at a low level workload and increase workload after able to maintain previous level for 30 minutes. Increase time before increasing intensity.   Diabetes Yes   Goal Blood  glucose control identified by blood glucose values, HgbA1C. Participant verbalizes understanding of the signs/symptoms of hyper/hypo glycemia, proper foot care and importance of medication and nutrition plan for blood glucose control.   Intervention Provide nutrition & aerobic exercise along with prescribed medications to achieve blood glucose in normal ranges: Fasting 65-99 mg/dL   Hypertension Yes   Goal Participant will see blood pressure controlled within the values of 140/47mm/Hg or within value directed by their physician.   Intervention Provide nutrition & aerobic exercise along with prescribed medications to achieve BP 140/90 or less.   Lipids Yes   Goal Cholesterol controlled with medications as prescribed, with individualized exercise RX and with personalized nutrition plan. Value goals: LDL < 70mg , HDL > 40mg . Participant  states understanding of desired cholesterol values and following prescriptions.   Intervention Provide nutrition & aerobic exercise along with prescribed medications to achieve LDL 70mg , HDL >40mg .   Stress Yes   Goal To meet with psychosocial counselor for stress and relaxation information and guidance. To state understanding of performing relaxation techniques and or identifying personal stressors.   Intervention Provide education on types of stress, identifiying stressors, and ways to cope with stress. Provide demonstration and active practice of relaxation techniques.      Tobacco Use Initial Evaluation: History  Smoking status  . Former Smoker -- 0.50 packs/day for 10 years  . Types: Cigarettes  . Quit date: 11/02/1978  Smokeless tobacco  . Never Used    Copy of goals given to participant.

## 2015-09-03 ENCOUNTER — Other Ambulatory Visit: Payer: Self-pay | Admitting: Surgery

## 2015-09-03 DIAGNOSIS — Z951 Presence of aortocoronary bypass graft: Secondary | ICD-10-CM

## 2015-09-04 ENCOUNTER — Ambulatory Visit: Payer: Self-pay | Admitting: Surgery

## 2015-09-05 ENCOUNTER — Encounter: Payer: Self-pay | Admitting: *Deleted

## 2015-09-05 DIAGNOSIS — Z006 Encounter for examination for normal comparison and control in clinical research program: Secondary | ICD-10-CM

## 2015-09-05 NOTE — Progress Notes (Signed)
Called Shannon Chung for his 30 day LEVO-CTS follow-up. He states he has been doing well and has not been hospitalized or had any adverse event.

## 2015-09-09 ENCOUNTER — Other Ambulatory Visit: Payer: Medicare Other

## 2015-09-10 ENCOUNTER — Encounter: Payer: Medicare Other | Attending: Cardiovascular Disease

## 2015-09-10 DIAGNOSIS — Z951 Presence of aortocoronary bypass graft: Secondary | ICD-10-CM

## 2015-09-10 LAB — GLUCOSE, CAPILLARY
GLUCOSE-CAPILLARY: 204 mg/dL — AB (ref 65–99)
Glucose-Capillary: 104 mg/dL — ABNORMAL HIGH (ref 65–99)

## 2015-09-10 NOTE — Progress Notes (Signed)
Daily Session Note  Patient Details  Name: Alakai Macbride MRN: [id number] Date of Birth: [birth year] Referring Provider:  Minna Merritts, MD  Encounter Date: 09/10/2015  Check In:     Session Check In - 09/10/15 0920    Check-In   Staff Present Candiss Norse MS, ACSM CEP Exercise Physiologist;Other;Diane Joya Gaskins RN, BSN   Medication changes reported     No   Fall or balance concerns reported    No   Warm-up and Cool-down Performed on first and last piece of equipment   VAD Patient? No   Pain Assessment   Currently in Pain? No/denies         Goals Met:  Exercise tolerated well No report of cardiac concerns or symptoms Strength training completed today  Goals Unmet:  Not Applicable  Goals Comments:    Dr. Emily Filbert is Medical Director for [facility] and [facility].

## 2015-09-11 ENCOUNTER — Ambulatory Visit (INDEPENDENT_AMBULATORY_CARE_PROVIDER_SITE_OTHER): Payer: Medicare Other | Admitting: Nurse Practitioner

## 2015-09-11 ENCOUNTER — Ambulatory Visit
Admission: RE | Admit: 2015-09-11 | Discharge: 2015-09-11 | Disposition: A | Discharge status: Home / Self Care | Payer: Medicare Other | Source: Ambulatory Visit | Attending: Surgery | Admitting: Surgery

## 2015-09-11 ENCOUNTER — Ambulatory Visit (INDEPENDENT_AMBULATORY_CARE_PROVIDER_SITE_OTHER): Payer: Self-pay | Admitting: Surgery

## 2015-09-11 ENCOUNTER — Encounter: Payer: Self-pay | Admitting: Surgery

## 2015-09-11 ENCOUNTER — Encounter: Payer: Self-pay | Admitting: Nurse Practitioner

## 2015-09-11 VITALS — BP 124/78 | HR 72 | Resp 16 | Ht 74.0 in | Wt 222.0 lb

## 2015-09-11 VITALS — BP 134/84 | HR 60 | Ht 74.0 in | Wt 222.8 lb

## 2015-09-11 DIAGNOSIS — E119 Type 2 diabetes mellitus without complications: Secondary | ICD-10-CM | POA: Diagnosis not present

## 2015-09-11 DIAGNOSIS — Z951 Presence of aortocoronary bypass graft: Secondary | ICD-10-CM

## 2015-09-11 DIAGNOSIS — E785 Hyperlipidemia, unspecified: Secondary | ICD-10-CM

## 2015-09-11 DIAGNOSIS — I10 Essential (primary) hypertension: Secondary | ICD-10-CM

## 2015-09-11 DIAGNOSIS — I2510 Atherosclerotic heart disease of native coronary artery without angina pectoris: Secondary | ICD-10-CM | POA: Diagnosis not present

## 2015-09-11 DIAGNOSIS — Z9889 Other specified postprocedural states: Secondary | ICD-10-CM | POA: Diagnosis not present

## 2015-09-11 DIAGNOSIS — I25118 Atherosclerotic heart disease of native coronary artery with other forms of angina pectoris: Secondary | ICD-10-CM | POA: Insufficient documentation

## 2015-09-11 DIAGNOSIS — I255 Ischemic cardiomyopathy: Secondary | ICD-10-CM

## 2015-09-11 NOTE — Progress Notes (Signed)
Patient Name: Shannon Chung Date of Encounter: 09/11/2015  Primary Care Provider:  Ria Bush, MD Primary Cardiologist:  Johnny Bridge, MD   Chief Complaint  74 year old male status post recent coronary artery bypass grafting who presents for follow-up.  Past Medical History   Past Medical History  Diagnosis Date  . Skin cancer     squamous and basal, sees derm regularly  . 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 Mural thrombus of cardiac apex ([place])     a. 06/2014: LV; resolved with coumadin-->no residual on f/u echo, no longer on coumadin.  . Ischemic cardiomyopathy     a. dilated, EF 35% improved to 45-50% (2015);  b. 07/2015 EF 25-35% by LV gram.  . History of radiation exposure     right vocal cord squamous cell cancer  . Essential hypertension   . Dyslipidemia   . 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 isolated episode, not on [place].  Marland Kitchen Vitamin D deficiency   . Coronary artery disease     a. 06/2015 Cardiac CT: Ca score 1103 (84th %'ile);  b. 07/2015 Cath: LM 70, LAD 80p, 100/42m, D1 70, D2 95, RI 75, RCA 100p/m;  c. 07/2015 CABG x 5 (LIMA->LAD, VG->Diag, VG->OM1->OM2, VG->OM3).  . Osteoarthritis     a. R-shoulder, L-knee [facility])  . Diabetes mellitus without complication ([place]) 0/9811   Past Surgical History  Procedure Laterality Date  . Tonsillectomy  1949  . Biceps tendon repair Right 1993  . Knee arthroscopy Left remote  . Cardiac catheterization N/A 07/05/2015    Procedure: Left Heart Cath and Coronary Angiography;  Surgeon: Minna Merritts, MD;  Location: [facility];  Service: Cardiovascular;  Laterality: N/A;  . Colonoscopy  2007  . Coronary artery bypass graft N/A 07/29/2015    Procedure: CORONARY ARTERY BYPASS GRAFTING (CABG) x 5 (LIMA to LAD, SVG to DIAGONAL, SVG SEQUENTIALLY to OM1 and OM2, SVG to OM3) with Endoscopic Vein Havesting of GREATER SAPHENOUS VEIN from RIGHT THIGH and partial LOWER LEG ;  Surgeon: [name], MD;  Location: [facility];  Service: Open Heart Surgery;  Laterality: N/A;  . Tee without cardioversion N/A 07/29/2015    Procedure: TRANSESOPHAGEAL ECHOCARDIOGRAM (TEE);  Surgeon: [name], MD;  Location: [facility];  Service: Open Heart Surgery;  Laterality: N/A;    Allergies  No Known Allergies  HPI  74 year old male with the above complex past medical history. He had a prior history of cardiomyopathy and LV mural thrombus requiring Coumadin anticoagulation in 2015. Apparently did not have diagnostic catheterization to determine the source of his cardiomyopathy. He was doing well and was not having any chest pain and saw Dr. Rockey Situ in August of this year. Cardiac CT was performed revealing a calcium score of 1103. Most of the calcium was in the LAD territory. Catheterization was subsequently performed revealing severe multivessel coronary artery disease involving the left main, LAD, diagonals, ramus, and right coronary artery. He was seen by thoracic surgery and subsequently underwent coronary artery bypass grafting 5 on September 26. He had an uneventful hospital course and was subsequently discharged. Since his discharge, he has done remarkably well. He reports good healing of all of his surgical wounds. He notes improved energy and has not been expressing any chest pain or dyspnea. He is tolerating his medications well and just started cardiac rehabilitation yesterday. He denies PND, orthopnea, dizziness, syncope, edema, or early satiety.  He is diligent about weighing himself daily and also avoiding sodium and processed foods.  Home Medications  Prior to Admission medications   Medication Sig Start Date End Date Taking? Authorizing Provider  acetaminophen (TYLENOL) 500 MG tablet Take 500 mg by mouth every 6 (six) hours as needed for mild pain.    Yes Historical Provider, MD  aspirin (ASPIRIN EC) 81 MG EC tablet Take 81 mg by mouth daily. Swallow whole.   Yes Historical Provider, MD    fluticasone (FLONASE) 50 MCG/ACT nasal spray Place 1 spray into both nostrils as needed for allergies.    Yes Historical Provider, MD  lansoprazole (PREVACID) 30 MG capsule Take 1 capsule (30 mg total) by mouth every morning. 08/12/15  Yes Ria Bush, MD  lisinopril (PRINIVIL,ZESTRIL) 2.5 MG tablet Take 1 tablet (2.5 mg total) by mouth daily. 01/02/15  Yes Minna Merritts, MD  meloxicam (MOBIC) 15 MG tablet Take 7.5 mg by mouth once a week. Saturday or sunday 12/05/14  Yes Historical Provider, MD  metoprolol succinate (TOPROL-XL) 25 MG 24 hr tablet Take 1 tablet (25 mg total) by mouth daily. 01/02/15  Yes Minna Merritts, MD  ranitidine (ZANTAC) 150 MG tablet Take 1 tablet (150 mg total) by mouth at bedtime. 08/08/15  Yes Ria Bush, MD  simvastatin (ZOCOR) 40 MG tablet Take 1 tablet (40 mg total) by mouth daily at 6 PM. 01/02/15  Yes Minna Merritts, MD    Review of Systems  As above, he is doing well without chest pain or dyspnea. He does note some mild tenderness along the medial aspect of his right lower thigh, calf, and ankle.  He denies PND, orthopnea, dizziness, syncope, edema, or early satiety. All other systems reviewed and are otherwise negative except as noted above.  Physical Exam  VS:  BP 134/84 mmHg  Pulse 60  Ht 6\' 2"  (1.88 m)  Wt 222 lb 12 oz (101.039 kg)  BMI 28.59 kg/m2 , BMI Body mass index is 28.59 kg/(m^2). GEN: Well nourished, well developed, in no acute distress. HEENT: normal. Neck: Supple, no JVD, carotid bruits, or masses. Cardiac: RRR, no murmurs, rubs, or gallops. No clubbing, cyanosis, edema.  Radials/DP/PT 2+ and equal bilaterally. Right thigh and lower leg surgical sites are well-healed without discharge or erythema. Midsternal surgical incision is well-healed without discharge or erythema. Respiratory:  Respirations regular and unlabored, clear to auscultation bilaterally. GI: Soft, nontender, nondistended, BS + x 4. Upper abdominal drain sites are  well-healed without erythema or discharge. MS: no deformity or atrophy. Skin: warm and dry, no rash. Neuro:  Strength and sensation are intact. Psych: Normal affect.  Accessory Clinical Findings  ECG - regular sinus rhythm, 60, left axis deviation, inferior infarct, anterior infarct, lateral T-wave inversion. Lateral T changes are more pronounced on current ECG.  Assessment & Plan  1.  Coronary artery disease: Status post coronary artery bypass grafting 5 in September 2016. He had an uneventful postoperative course and has been doing exceptionally well since then. He has not been having any chest pain or dyspnea and although he thought he was a somatic prior to surgery, he has noted improved energy since surgery. He remains on aspirin, statin, beta blocker, and ACE inhibitor therapy and is tolerating his medications well. He just started cardiac rehabilitation here at [facility] yesterday and is excited to be partaking in rehabilitation going forward. He has follow-up with thoracic surgery later today.  2. Ischemic cardiomyopathy: Patient is very well compensated and euvolemic on  exam. He notes that he has never required diuretic therapy. He is on beta blocker and ACE inhibitor therapy.  We discussed the importance of daily weights, sodium restriction, medication compliance, and symptom reporting and he verbalizes understanding. I will arrange for repeat echocardiography in approximately 2 months as his revascularization was already over a month ago. Pending recovery of LV function, he may require electrophysiology referral for ICD consideration.  3. Essential hypertension: This is stable on beta blocker and ACE inhibitor therapy.  4. Hyperlipidemia:  LDL was 82 in January 2016. He has been on simvastatin 40 mg chronically. Normal LFTs in September.   5. Type 2 diabetes mellitus: Hemoglobin A1c was 7.0 on September 22. This is currently being medically managed and he is trying to lose weight. He  will follow up with primary care related to this issue.  6. History of LV mural thrombus: He was previously on Coumadin however this was discontinued after repeat echo showed resolution of LV thrombus.  7. Disposition: Follow-up echocardiography in about 8 wks and f/u with Dr. Rockey Situ in ~ 10-12 wks or sooner if necessary.   Murray Hodgkins, NP 09/11/2015, 11:45 AM

## 2015-09-11 NOTE — Progress Notes (Addendum)
Cardiac Individual Treatment Plan  Patient Details  Name: Shannon Chung MRN: [id number] Date of Birth: [birth year] Referring Provider:  Delano Metz, MD  Initial Encounter Date: 08/28/2015    Visit Diagnosis: S/P CABG x 5  Patient's Home Medications on Admission:  Current outpatient prescriptions:  .  acetaminophen (TYLENOL) 500 MG tablet, Take 500 mg by mouth every 6 (six) hours as needed for mild pain. , Disp: , Rfl:  .  aspirin (ASPIRIN EC) 81 MG EC tablet, Take 81 mg by mouth daily. Swallow whole., Disp: , Rfl:  .  fluticasone (FLONASE) 50 MCG/ACT nasal spray, Place 1 spray into both nostrils as needed for allergies. , Disp: , Rfl:  .  lansoprazole (PREVACID) 30 MG capsule, Take 1 capsule (30 mg total) by mouth every morning., Disp: 90 capsule, Rfl: 3 .  lisinopril (PRINIVIL,ZESTRIL) 2.5 MG tablet, Take 1 tablet (2.5 mg total) by mouth daily., Disp: 90 tablet, Rfl: 3 .  meloxicam (MOBIC) 15 MG tablet, Take 7.5 mg by mouth once a week. Saturday or sunday, Disp: , Rfl: 1 .  metoprolol succinate (TOPROL-XL) 25 MG 24 hr tablet, Take 1 tablet (25 mg total) by mouth daily., Disp: 90 tablet, Rfl: 3 .  ranitidine (ZANTAC) 150 MG tablet, Take 1 tablet (150 mg total) by mouth at bedtime., Disp: 180 tablet, Rfl: 1 .  simvastatin (ZOCOR) 40 MG tablet, Take 1 tablet (40 mg total) by mouth daily at 6 PM., Disp: 90 tablet, Rfl: 3  Past Medical History: Past Medical History  Diagnosis Date  . Skin cancer     squamous and basal, sees derm regularly  . 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 [place] thrombus of cardiac apex (HCC) 06/2014    LV; resolved with coumadin  . Ischemic cardiomyopathy     dilated, EF 35% improved to 45-50% (2015)  . History of radiation exposure     right vocal cord squamous cell cancer  . HTN (hypertension)   . Dyslipidemia   . [place] atrial fibrillation ([place]) 1983    isolated episode  . Vitamin D deficiency   . Coronary artery disease   .  Myocardial infarction ([place]) 12/2013    silent MI  . GERD (gastroesophageal reflux disease)   . Osteoarthritis     R-shoulder, L-knee [facility])  . Diabetes mellitus without complication ([place]) 11/2749    new onset  . S/P CABG x 5 2016    [place]    Tobacco Use: History  Smoking status  . Former Smoker -- 0.50 packs/day for 10 years  . Types: Cigarettes  . Quit date: 11/02/1978  Smokeless tobacco  . Never Used    Labs: Recent Review Flowsheet Data    Labs for ITP Cardiac and Pulmonary Rehab Latest Ref Rng 07/29/2015 07/29/2015 07/29/2015 07/29/2015 07/29/2015   PHART 7.350 - 7.450 - 7.379 7.345(L) - 7.333(L)   PCO2ART 35.0 - 45.0 mmHg - 36.4 45.5(H) - 45.2(H)   HCO3 20.0 - 24.0 mEq/L - 21.7 24.8(H) - 24.0   TCO2 0 - 100 mmol/L 22 23 26 23 25    ACIDBASEDEF 0.0 - 2.0 mmol/L - 3.0(H) 1.0 - 2.0   O2SAT - - 91.0 95.0 - 96.0       Exercise Target Goals:    Exercise Program Goal: Individual exercise prescription set with THRR, safety & activity barriers. Participant demonstrates ability to understand and report RPE using BORG scale, to self-measure pulse accurately, and to acknowledge the importance of  the exercise prescription.  Exercise Prescription Goal: Starting with aerobic activity 30 plus minutes a day, 3 days per week for initial exercise prescription. Provide home exercise prescription and guidelines that participant acknowledges understanding prior to discharge.  Activity Barriers & Risk Stratification:     Activity Barriers & Risk Stratification - 08/28/15 1858    Activity Barriers & Risk Stratification   Risk Stratification High      6 Minute Walk:     6 Minute Walk      08/28/15 1420       6 Minute Walk   Phase Initial     Distance 1330 feet     Walk Time 6 minutes     Resting HR 65 bpm     Resting BP 132/78 mmHg     Max Ex. HR 107 bpm     Max Ex. BP 144/60 mmHg     RPE 13     Symptoms Yes (comment)     Comments L knee pain due to arthritis 5/10  pain        Initial Exercise Prescription:     Initial Exercise Prescription - 08/28/15 1400    Date of Initial Exercise Prescription   Date 08/28/15   Treadmill   MPH 2.2   Grade 0   Minutes 10   Bike   Level 0.4   Minutes 15   Recumbant Bike   Level 3   RPM 40   Watts 30   Minutes 15   NuStep   Level 3   Watts 30   Minutes 15   Arm Ergometer   Level 1   Watts 8   Minutes 10   Arm/Foot Ergometer   Level 4   Watts 12   Minutes 10   Cybex   Level 3   RPM 50   Minutes 10   Recumbant Elliptical   Level 1   RPM 40   Watts 10   Minutes 10   Elliptical   Level 1   Speed 3   Minutes 1   REL-XR   Level 3   Watts 40   Minutes 15   Prescription Details   Frequency (times per week) 3   Duration Progress to 30 minutes of continuous aerobic without signs/symptoms of physical distress   Intensity   THRR REST +  30   Ratings of Perceived Exertion 11-15   Progression Continue progressive overload as per policy without signs/symptoms or physical distress.   Resistance Training   Training Prescription Yes   Weight 2   Reps 10-15      Exercise Prescription Changes:     Exercise Prescription Changes      09/10/15 1100           Exercise Review   Progression No       Response to Exercise   Blood Pressure (Admit) 110/68 mmHg       Blood Pressure (Exercise) 130/72 mmHg       Blood Pressure (Exit) 106/68 mmHg       Heart Rate (Admit) 71 bpm       Heart Rate (Exercise) 80 bpm       Heart Rate (Exit) 67 bpm       Rating of Perceived Exertion (Exercise) 14       Symptoms No       Comments Patient tolerated exercise well on his first day of class without signs or symptoms.       Duration  Progress to 30 minutes of continuous aerobic without signs/symptoms of physical distress       Intensity Rest + 30       Progression Continue progressive overload as per policy without signs/symptoms or physical distress.       Resistance Training   Training Prescription  Yes       Weight 3       Reps 10-15       Interval Training   Interval Training No       Recumbant Elliptical   Level 5  =Bio       Watts 40       Minutes 15       REL-XR   Level 4       Watts 58       Minutes 15          Discharge Exercise Prescription (Final Exercise Prescription Changes):     Exercise Prescription Changes - 09/10/15 1100    Exercise Review   Progression No   Response to Exercise   Blood Pressure (Admit) 110/68 mmHg   Blood Pressure (Exercise) 130/72 mmHg   Blood Pressure (Exit) 106/68 mmHg   Heart Rate (Admit) 71 bpm   Heart Rate (Exercise) 80 bpm   Heart Rate (Exit) 67 bpm   Rating of Perceived Exertion (Exercise) 14   Symptoms No   Comments Patient tolerated exercise well on his first day of class without signs or symptoms.   Duration Progress to 30 minutes of continuous aerobic without signs/symptoms of physical distress   Intensity Rest + 30   Progression Continue progressive overload as per policy without signs/symptoms or physical distress.   Resistance Training   Training Prescription Yes   Weight 3   Reps 10-15   Interval Training   Interval Training No   Recumbant Elliptical   Level 5  =Bio   Watts 40   Minutes 15   REL-XR   Level 4   Watts 58   Minutes 15      Nutrition:  Target Goals: Understanding of nutrition guidelines, daily intake of sodium 1500mg , cholesterol 200mg , calories 30% from fat and 7% or less from saturated fats, daily to have 5 or more servings of fruits and vegetables.  Biometrics:     Pre Biometrics - 08/28/15 1413    Pre Biometrics   Height 6\' 2"  (1.88 m)   Weight 222 lb 8 oz (100.925 kg)   Waist Circumference 40.25 inches   Hip Circumference 41.5 inches   Waist to Hip Ratio 0.97 %   BMI (Calculated) 28.6       Nutrition Therapy Plan and Nutrition Goals:   Nutrition Discharge: Rate Your Plate Scores:   Nutrition Goals Re-Evaluation:   Psychosocial: Target Goals: Acknowledge presence  or absence of depression, maximize coping skills, provide positive support system. Participant is able to verbalize types and ability to use techniques and skills needed for reducing stress and depression.  Initial Review & Psychosocial Screening:     Initial Psych Review & Screening - 08/28/15 [place]? Yes   Comments Patient has fiancee, Rachel Moulds; two daughters and 5 grandchildren.  Patient is affliated with [facility].     Barriers   Psychosocial barriers to participate in program There are no identifiable barriers or psychosocial needs.  Patient stated one year ago is was really stressed when his daughter had cancer and he was helping take care of his  grandchildren.  He was worried about his daughter.  She is cancer free at this time.     Screening Interventions   Interventions Encouraged to exercise      Quality of Life Scores:   PHQ-9:     Recent Review Flowsheet Data    Depression screen [facility] 2/9 08/28/2015   Decreased Interest 0   Down, Depressed, Hopeless 0   PHQ - 2 Score 0   Altered sleeping 1   Tired, decreased energy 1   Change in appetite 0   Feeling bad or failure about yourself  0   Trouble concentrating 0   Moving slowly or fidgety/restless 0   Suicidal thoughts 0   PHQ-9 Score 2   Difficult doing work/chores Not difficult at all      Psychosocial Evaluation and Intervention:   Psychosocial Re-Evaluation:   Vocational Rehabilitation: Provide vocational rehab assistance to qualifying candidates.   Vocational Rehab Evaluation & Intervention:     Vocational Rehab - 08/28/15 1900    Initial Vocational Rehab Evaluation & Intervention   Assessment shows need for Vocational Rehabilitation No      Education: Education Goals: Education classes will be provided on a weekly basis, covering required topics. Participant will state understanding/return demonstration of topics presented.  Learning  Barriers/Preferences:     Learning Barriers/Preferences - 08/28/15 1858    Learning Barriers/Preferences   Learning Barriers Sight;Exercise Concerns   Learning Preferences Written [place]      Education Topics: General Nutrition Guidelines/Fats and Fiber: -Group instruction provided by verbal, written material, models and posters to present the general guidelines for heart healthy nutrition. Gives an explanation and review of dietary fats and fiber.   Controlling Sodium/Reading Food Labels: -Group verbal and written material supporting the discussion of sodium use in heart healthy nutrition. Review and explanation with models, verbal and written materials for utilization of the food label.   Exercise Physiology & Risk Factors: - Group verbal and written instruction with models to review the exercise physiology of the cardiovascular system and associated critical values. Details cardiovascular disease risk factors and the goals associated with each risk factor.   Aerobic Exercise & Resistance Training: - Gives group verbal and written discussion on the health impact of inactivity. On the components of aerobic and resistive training programs and the benefits of this training and how to safely progress through these programs.          Cardiac Rehab from 09/10/2015 in [facility] Cardiac Rehab   Date  09/10/15   Educator  RM   Instruction Review Code  2- meets goals/outcomes      Flexibility, Balance, General Exercise Guidelines: - Provides group verbal and written instruction on the benefits of flexibility and balance training programs. Provides general exercise guidelines with specific guidelines to those with heart or lung disease. Demonstration and skill practice provided.   Stress Management: - Provides group verbal and written instruction about the health risks of elevated stress, cause of high stress, and healthy ways to reduce stress.   Depression: - Provides group  verbal and written instruction on the correlation between heart/lung disease and depressed mood, treatment options, and the stigmas associated with seeking treatment.   Anatomy & Physiology of the Heart: - Group verbal and written instruction and models provide basic cardiac anatomy and physiology, with the coronary electrical and arterial systems. Review of: AMI, Angina, Valve disease, Heart Failure, Cardiac Arrhythmia, Pacemakers, and the ICD.   Cardiac Procedures: - Group verbal and written instruction  and models to describe the testing methods done to diagnose heart disease. Reviews the outcomes of the test results. Describes the treatment choices: Medical Management, Angioplasty, or Coronary Bypass Surgery.   Cardiac Medications: - Group verbal and written instruction to review commonly prescribed medications for heart disease. Reviews the medication, class of the drug, and side effects. Includes the steps to properly store meds and maintain the prescription regimen.   Go Sex-Intimacy & Heart Disease, Get SMART - Goal Setting: - Group verbal and written instruction through game format to discuss heart disease and the return to sexual intimacy. Provides group verbal and written material to discuss and apply goal setting through the application of the S.M.A.R.T. Method.   Other Matters of the Heart: - Provides group verbal, written materials and models to describe Heart Failure, Angina, Valve Disease, and Diabetes in the realm of heart disease. Includes description of the disease process and treatment options available to the cardiac patient.   Exercise & Equipment Safety: - Individual verbal instruction and demonstration of equipment use and safety with use of the equipment.      Cardiac Rehab from 09/10/2015 in [facility] Cardiac Rehab   Date  08/28/15   Educator  DW   Instruction Review Code  1- partially meets, needs review/practice      Infection Prevention: - Provides verbal and  written material to individual with discussion of infection control including proper hand washing and proper equipment cleaning during exercise session.      Cardiac Rehab from 09/10/2015 in [facility] Cardiac Rehab   Date  08/28/15   Educator  DW   Instruction Review Code  2- meets goals/outcomes      Falls Prevention: - Provides verbal and written material to individual with discussion of falls prevention and safety.      Cardiac Rehab from 09/10/2015 in [facility] Cardiac Rehab   Date  08/28/15   Educator  DW   Instruction Review Code  2- meets goals/outcomes      Diabetes: - Individual verbal and written instruction to review signs/symptoms of diabetes, desired ranges of glucose level fasting, after meals and with exercise. Advice that pre and post exercise glucose checks will be done for 3 sessions at entry of program.      Cardiac Rehab from 09/10/2015 in [facility] Cardiac Rehab   Date  08/28/15   Educator  DW   Instruction Review Code  2- meets goals/outcomes       Knowledge Questionnaire Score:     Knowledge Questionnaire Score - 08/28/15 1858    Knowledge Questionnaire Score   Pre Score 23/28      Personal Goals and Risk Factors at Admission:     Personal Goals and Risk Factors at Admission - 08/28/15 1902    Personal Goals and Risk Factors on Admission    Weight Management Yes   Intervention Learn and follow the exercise and diet guidelines while in the program. Utilize the nutrition and education classes to help gain knowledge of the diet and exercise expectations in the program   Admit Weight 222 lb 8 oz (100.925 kg)   Goal Weight 205 lb (92.987 kg)   Increase Aerobic Exercise and Physical Activity Yes   Intervention While in program, learn and follow the exercise prescription taught. Start at a low level workload and increase workload after able to maintain previous level for 30 minutes. Increase time before increasing intensity.   Diabetes Yes   Goal Blood glucose control  identified by blood glucose  values, HgbA1C. Participant verbalizes understanding of the signs/symptoms of hyper/hypo glycemia, proper foot care and importance of medication and nutrition plan for blood glucose control.   Intervention Provide nutrition & aerobic exercise along with prescribed medications to achieve blood glucose in normal ranges: Fasting 65-99 mg/dL   Hypertension Yes   Goal Participant will see blood pressure controlled within the values of 140/74mm/Hg or within value directed by their physician.   Intervention Provide nutrition & aerobic exercise along with prescribed medications to achieve BP 140/90 or less.   Lipids Yes   Goal Cholesterol controlled with medications as prescribed, with individualized exercise RX and with personalized nutrition plan. Value goals: LDL < 70mg , HDL > 40mg . Participant states understanding of desired cholesterol values and following prescriptions.   Intervention Provide nutrition & aerobic exercise along with prescribed medications to achieve LDL 70mg , HDL >40mg .   Stress Yes   Goal To meet with psychosocial counselor for stress and relaxation information and guidance. To state understanding of performing relaxation techniques and or identifying personal stressors.   Intervention Provide education on types of stress, identifiying stressors, and ways to cope with stress. Provide demonstration and active practice of relaxation techniques.      Personal Goals and Risk Factors Review:      Goals and Risk Factor Review      09/11/15 0909           Increase Aerobic Exercise and Physical Activity   Goals Progress/Improvement seen  Yes       Comments Addiel has increased his Cardiac Rehab exercise workloads.        Diabetes   Goal --  Stable blood sugars for [place].        Hypertension   Goal --  Reise's blood pressure has been good.           Personal Goals Discharge (Final Personal Goals and Risk Factors Review):      Goals and Risk Factor  Review - 09/11/15 0909    Increase Aerobic Exercise and Physical Activity   Goals Progress/Improvement seen  Yes   Comments Iwao has increased his Cardiac Rehab exercise workloads.    Diabetes   Goal --  Stable blood sugars for Esmeralda.    Hypertension   Goal --  Nettie's blood pressure has been good.        Comments: 30 day review.

## 2015-09-11 NOTE — Progress Notes (Signed)
     HPI: Patient returns for routine postoperative follow-up having undergone CABG x 5 on 07/29/2015. Preop LVEF was 55-65% at cath. The patient's early postoperative recovery while in the hospital was notable for an uncomplicated postop course. Since hospital discharge the patient reports that he feels well. He started cardiac rehab yesterday.   Current Outpatient Prescriptions  Medication Sig Dispense Refill  . acetaminophen (TYLENOL) 500 MG tablet Take 500 mg by mouth every 6 (six) hours as needed for mild pain.     Marland Kitchen aspirin (ASPIRIN EC) 81 MG EC tablet Take 81 mg by mouth daily. Swallow whole.    . fluticasone (FLONASE) 50 MCG/ACT nasal spray Place 1 spray into both nostrils as needed for allergies.     Marland Kitchen lansoprazole (PREVACID) 30 MG capsule Take 1 capsule (30 mg total) by mouth every morning. 90 capsule 3  . lisinopril (PRINIVIL,ZESTRIL) 2.5 MG tablet Take 1 tablet (2.5 mg total) by mouth daily. 90 tablet 3  . meloxicam (MOBIC) 15 MG tablet Take 7.5 mg by mouth once a week. Saturday or sunday  1  . metoprolol succinate (TOPROL-XL) 25 MG 24 hr tablet Take 1 tablet (25 mg total) by mouth daily. 90 tablet 3  . ranitidine (ZANTAC) 150 MG tablet Take 1 tablet (150 mg total) by mouth at bedtime. 180 tablet 1  . simvastatin (ZOCOR) 40 MG tablet Take 1 tablet (40 mg total) by mouth daily at 6 PM. 90 tablet 3   No current facility-administered medications for this visit.    Physical Exam: BP 124/78 mmHg  Pulse 72  Resp 16  Ht 6\' 2"  (1.88 m)  Wt 222 lb (100.699 kg)  BMI 28.49 kg/m2  SpO2 96% He looks well. Lung exam is clear. Cardiac exam shows a regular rate and rhythm with normal heart sounds. Chest incision is healing well and sternum is stable. The leg incisions are healing well and there is no peripheral edema.   Diagnostic Tests:  CLINICAL DATA: History of CABG on July 29, 2015 no complaints today, follow-up appointment.  EXAM: CHEST 2 VIEW  COMPARISON: PA  and lateral chest x-ray of July 31, 2015  FINDINGS: The lungs are well-expanded and clear. The left lower lobe atelectasis and small pleural effusion have resolved. The heart and pulmonary vascularity are normal. The mediastinum is normal in width. There is tortuosity of the descending thoracic aorta. There is 7 intact sternal wires. The bony thorax is unremarkable.  IMPRESSION: There is no active cardiopulmonary disease. Interval resolution of postsurgical changes.   Electronically Signed  By: David Martinique M.D.  On: 09/11/2015 13:08  Impression:  Overall I think he is doing well. I encouraged him to continue walking. He is planning to finish cardiac rehab. I told him he could drive his car but should not lift anything heavier than 10 lbs for three months postop.    Plan:  He will continue to follow up with Dr. Rockey Situ.    [name], MD [facility] [phone]

## 2015-09-11 NOTE — Patient Instructions (Addendum)
Medication Instructions:  Your physician recommends that you continue on your current medications as directed. Please refer to the Current Medication list given to you today.   Labwork: None   Testing/Procedures: Your physician has requested that you have an echocardiogram in two months. Echocardiography is a painless test that uses sound waves to create images of your heart. It provides your doctor with information about the size and shape of your heart and how well your heart's chambers and valves are working. This procedure takes approximately one hour. There are no restrictions for this procedure.    Follow-Up: Your physician recommends that you schedule a follow-up appointment in: 2 1/2 months with Dr. Rockey Situ   Any Other Special Instructions Will Be Listed Below (If Applicable).     If you need a refill on your cardiac medications before your next appointment, please call your pharmacy.  Echocardiogram An echocardiogram, or echocardiography, uses sound waves (ultrasound) to produce an image of your heart. The echocardiogram is simple, painless, obtained within a short period of time, and offers valuable information to your health care provider. The images from an echocardiogram can provide information such as:  Evidence of coronary artery disease (CAD).  Heart size.  Heart muscle function.  Heart valve function.  Aneurysm detection.  Evidence of a past heart attack.  Fluid buildup around the heart.  Heart muscle thickening.  Assess heart valve function. LET [facility] CARE PROVIDER KNOW ABOUT:  Any allergies you have.  All medicines you are taking, including vitamins, herbs, eye drops, creams, and over-the-counter medicines.  Previous problems you or members of your family have had with the use of anesthetics.  Any blood disorders you have.  Previous surgeries you have had.  Medical conditions you have.  Possibility of pregnancy, if this  applies. BEFORE THE PROCEDURE  No special preparation is needed. Eat and drink normally.  PROCEDURE   In order to produce an image of your heart, gel will be applied to your chest and a wand-like tool (transducer) will be moved over your chest. The gel will help transmit the sound waves from the transducer. The sound waves will harmlessly bounce off your heart to allow the heart images to be captured in real-time motion. These images will then be recorded.  You may need an IV to receive a medicine that improves the quality of the pictures. AFTER THE PROCEDURE You may return to your normal schedule including diet, activities, and medicines, unless your health care provider tells you otherwise.   This information is not intended to replace advice given to you by your health care provider. Make sure you discuss any questions you have with your health care provider.   Document Released: 10/16/2000 Document Revised: 11/09/2014 Document Reviewed: 06/26/2013 Elsevier Interactive Patient Education [employer].

## 2015-09-11 NOTE — Addendum Note (Signed)
Addended by: Gerlene Burdock on: 09/11/2015 09:16 AM   Modules accepted: Orders

## 2015-09-11 NOTE — Progress Notes (Signed)
Cardiac Individual Treatment Plan  Patient Details  Name: Shannon Chung MRN: [id number] Date of Birth: May 18, 1941 Referring Provider:  No ref. provider found  Initial Encounter Date: 08/28/2015  Visit Diagnosis: S/P CABG x 5  Patient's Home Medications on Admission:  Current outpatient prescriptions:  .  acetaminophen (TYLENOL) 500 MG tablet, Take 500 mg by mouth every 6 (six) hours as needed for mild pain. , Disp: , Rfl:  .  aspirin (ASPIRIN EC) 81 MG EC tablet, Take 81 mg by mouth daily. Swallow whole., Disp: , Rfl:  .  fluticasone (FLONASE) 50 MCG/ACT nasal spray, Place 1 spray into both nostrils as needed for allergies. , Disp: , Rfl:  .  lansoprazole (PREVACID) 30 MG capsule, Take 1 capsule (30 mg total) by mouth every morning., Disp: 90 capsule, Rfl: 3 .  lisinopril (PRINIVIL,ZESTRIL) 2.5 MG tablet, Take 1 tablet (2.5 mg total) by mouth daily., Disp: 90 tablet, Rfl: 3 .  meloxicam (MOBIC) 15 MG tablet, Take 7.5 mg by mouth once a week. Saturday or sunday, Disp: , Rfl: 1 .  metoprolol succinate (TOPROL-XL) 25 MG 24 hr tablet, Take 1 tablet (25 mg total) by mouth daily., Disp: 90 tablet, Rfl: 3 .  ranitidine (ZANTAC) 150 MG tablet, Take 1 tablet (150 mg total) by mouth at bedtime., Disp: 180 tablet, Rfl: 1 .  simvastatin (ZOCOR) 40 MG tablet, Take 1 tablet (40 mg total) by mouth daily at 6 PM., Disp: 90 tablet, Rfl: 3  Past Medical History: Past Medical History  Diagnosis Date  . Skin cancer     squamous and basal, sees derm regularly  . 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 [place] thrombus of cardiac apex ([facility]) 06/2014    LV; resolved with coumadin  . Ischemic cardiomyopathy     dilated, EF 35% improved to 45-50% (2015)  . History of radiation exposure     right vocal cord squamous cell cancer  . HTN (hypertension)   . Dyslipidemia   . 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 Vitamin D deficiency   . Coronary artery disease   .  Myocardial infarction ([place]) 12/2013    silent MI  . GERD (gastroesophageal reflux disease)   . Osteoarthritis     R-shoulder, L-knee [facility])  . Diabetes mellitus without complication ([place]) 06/9210    new onset  . S/P CABG x 5 2016    [place]    Tobacco Use: History  Smoking status  . Former Smoker -- 0.50 packs/day for 10 years  . Types: Cigarettes  . Quit date: 11/02/1978  Smokeless tobacco  . Never Used    Labs: Recent Review Flowsheet Data    Labs for ITP Cardiac and Pulmonary Rehab Latest Ref Rng 07/29/2015 07/29/2015 07/29/2015 07/29/2015 07/29/2015   PHART 7.350 - 7.450 - 7.379 7.345(L) - 7.333(L)   PCO2ART 35.0 - 45.0 mmHg - 36.4 45.5(H) - 45.2(H)   HCO3 20.0 - 24.0 mEq/L - 21.7 24.8(H) - 24.0   TCO2 0 - 100 mmol/L 22 23 26 23 25    ACIDBASEDEF 0.0 - 2.0 mmol/L - 3.0(H) 1.0 - 2.0   O2SAT - - 91.0 95.0 - 96.0       Exercise Target Goals:    Exercise Program Goal: Individual exercise prescription set with THRR, safety & activity barriers. Participant demonstrates ability to understand and report RPE using BORG scale, to self-measure pulse accurately, and to acknowledge the importance of the  exercise prescription.  Exercise Prescription Goal: Starting with aerobic activity 30 plus minutes a day, 3 days per week for initial exercise prescription. Provide home exercise prescription and guidelines that participant acknowledges understanding prior to discharge.  Activity Barriers & Risk Stratification:     Activity Barriers & Risk Stratification - 08/28/15 1858    Activity Barriers & Risk Stratification   Risk Stratification High      6 Minute Walk:     6 Minute Walk      08/28/15 1420       6 Minute Walk   Phase Initial     Distance 1330 feet     Walk Time 6 minutes     Resting HR 65 bpm     Resting BP 132/78 mmHg     Max Ex. HR 107 bpm     Max Ex. BP 144/60 mmHg     RPE 13     Symptoms Yes (comment)     Comments L knee pain due to arthritis 5/10  pain        Initial Exercise Prescription:     Initial Exercise Prescription - 08/28/15 1400    Date of Initial Exercise Prescription   Date 08/28/15   Treadmill   MPH 2.2   Grade 0   Minutes 10   Bike   Level 0.4   Minutes 15   Recumbant Bike   Level 3   RPM 40   Watts 30   Minutes 15   NuStep   Level 3   Watts 30   Minutes 15   Arm Ergometer   Level 1   Watts 8   Minutes 10   Arm/Foot Ergometer   Level 4   Watts 12   Minutes 10   Cybex   Level 3   RPM 50   Minutes 10   Recumbant Elliptical   Level 1   RPM 40   Watts 10   Minutes 10   Elliptical   Level 1   Speed 3   Minutes 1   REL-XR   Level 3   Watts 40   Minutes 15   Prescription Details   Frequency (times per week) 3   Duration Progress to 30 minutes of continuous aerobic without signs/symptoms of physical distress   Intensity   THRR REST +  30   Ratings of Perceived Exertion 11-15   Progression Continue progressive overload as per policy without signs/symptoms or physical distress.   Resistance Training   Training Prescription Yes   Weight 2   Reps 10-15      Exercise Prescription Changes:     Exercise Prescription Changes      09/10/15 1100           Exercise Review   Progression No       Response to Exercise   Blood Pressure (Admit) 110/68 mmHg       Blood Pressure (Exercise) 130/72 mmHg       Blood Pressure (Exit) 106/68 mmHg       Heart Rate (Admit) 71 bpm       Heart Rate (Exercise) 80 bpm       Heart Rate (Exit) 67 bpm       Rating of Perceived Exertion (Exercise) 14       Symptoms No       Comments Patient tolerated exercise well on his first day of class without signs or symptoms.       Duration Progress  to 30 minutes of continuous aerobic without signs/symptoms of physical distress       Intensity Rest + 30       Progression Continue progressive overload as per policy without signs/symptoms or physical distress.       Resistance Training   Training Prescription  Yes       Weight 3       Reps 10-15       Interval Training   Interval Training No       Recumbant Elliptical   Level 5  =Bio       Watts 40       Minutes 15       REL-XR   Level 4       Watts 58       Minutes 15          Discharge Exercise Prescription (Final Exercise Prescription Changes):     Exercise Prescription Changes - 09/10/15 1100    Exercise Review   Progression No   Response to Exercise   Blood Pressure (Admit) 110/68 mmHg   Blood Pressure (Exercise) 130/72 mmHg   Blood Pressure (Exit) 106/68 mmHg   Heart Rate (Admit) 71 bpm   Heart Rate (Exercise) 80 bpm   Heart Rate (Exit) 67 bpm   Rating of Perceived Exertion (Exercise) 14   Symptoms No   Comments Patient tolerated exercise well on his first day of class without signs or symptoms.   Duration Progress to 30 minutes of continuous aerobic without signs/symptoms of physical distress   Intensity Rest + 30   Progression Continue progressive overload as per policy without signs/symptoms or physical distress.   Resistance Training   Training Prescription Yes   Weight 3   Reps 10-15   Interval Training   Interval Training No   Recumbant Elliptical   Level 5  =Bio   Watts 40   Minutes 15   REL-XR   Level 4   Watts 58   Minutes 15      Nutrition:  Target Goals: Understanding of nutrition guidelines, daily intake of sodium 1500mg , cholesterol 200mg , calories 30% from fat and 7% or less from saturated fats, daily to have 5 or more servings of fruits and vegetables.  Biometrics:     Pre Biometrics - 08/28/15 1413    Pre Biometrics   Height 6\' 2"  (1.88 m)   Weight 222 lb 8 oz (100.925 kg)   Waist Circumference 40.25 inches   Hip Circumference 41.5 inches   Waist to Hip Ratio 0.97 %   BMI (Calculated) 28.6       Nutrition Therapy Plan and Nutrition Goals:   Nutrition Discharge: Rate Your Plate Scores:   Nutrition Goals Re-Evaluation:   Psychosocial: Target Goals: Acknowledge presence  or absence of depression, maximize coping skills, provide positive support system. Participant is able to verbalize types and ability to use techniques and skills needed for reducing stress and depression.  Initial Review & Psychosocial Screening:     Initial Psych Review & Screening - 08/28/15 Shannon Chung? Yes   Comments Patient has fiancee, Shannon Chung; two daughters and 5 grandchildren.  Patient is affliated with [facility].     Barriers   Psychosocial barriers to participate in program There are no identifiable barriers or psychosocial needs.  Patient stated one year ago is was really stressed when his daughter had cancer and he was helping take care of his grandchildren.  He was worried about his daughter.  She is cancer free at this time.     Screening Interventions   Interventions Encouraged to exercise      Quality of Life Scores:   PHQ-9:     Recent Review Flowsheet Data    Depression screen [facility] 2/9 08/28/2015   Decreased Interest 0   Down, Depressed, Hopeless 0   PHQ - 2 Score 0   Altered sleeping 1   Tired, decreased energy 1   Change in appetite 0   Feeling bad or failure about yourself  0   Trouble concentrating 0   Moving slowly or fidgety/restless 0   Suicidal thoughts 0   PHQ-9 Score 2   Difficult doing work/chores Not difficult at all      Psychosocial Evaluation and Intervention:   Psychosocial Re-Evaluation:   Vocational Rehabilitation: Provide vocational rehab assistance to qualifying candidates.   Vocational Rehab Evaluation & Intervention:     Vocational Rehab - 08/28/15 1900    Initial Vocational Rehab Evaluation & Intervention   Assessment shows need for Vocational Rehabilitation No      Education: Education Goals: Education classes will be provided on a weekly basis, covering required topics. Participant will state understanding/return demonstration of topics presented.  Learning  Barriers/Preferences:     Learning Barriers/Preferences - 08/28/15 1858    Learning Barriers/Preferences   Learning Barriers Sight;Exercise Concerns   Learning Preferences Written [place]      Education Topics: General Nutrition Guidelines/Fats and Fiber: -Group instruction provided by verbal, written material, models and posters to present the general guidelines for heart healthy nutrition. Gives an explanation and review of dietary fats and fiber.   Controlling Sodium/Reading Food Labels: -Group verbal and written material supporting the discussion of sodium use in heart healthy nutrition. Review and explanation with models, verbal and written materials for utilization of the food label.   Exercise Physiology & Risk Factors: - Group verbal and written instruction with models to review the exercise physiology of the cardiovascular system and associated critical values. Details cardiovascular disease risk factors and the goals associated with each risk factor.   Aerobic Exercise & Resistance Training: - Gives group verbal and written discussion on the health impact of inactivity. On the components of aerobic and resistive training programs and the benefits of this training and how to safely progress through these programs.          Cardiac Rehab from 09/10/2015 in [facility] Cardiac Rehab   Date  09/10/15   Educator  RM   Instruction Review Code  2- meets goals/outcomes      Flexibility, Balance, General Exercise Guidelines: - Provides group verbal and written instruction on the benefits of flexibility and balance training programs. Provides general exercise guidelines with specific guidelines to those with heart or lung disease. Demonstration and skill practice provided.   Stress Management: - Provides group verbal and written instruction about the health risks of elevated stress, cause of high stress, and healthy ways to reduce stress.   Depression: - Provides group  verbal and written instruction on the correlation between heart/lung disease and depressed mood, treatment options, and the stigmas associated with seeking treatment.   Anatomy & Physiology of the Heart: - Group verbal and written instruction and models provide basic cardiac anatomy and physiology, with the coronary electrical and arterial systems. Review of: AMI, Angina, Valve disease, Heart Failure, Cardiac Arrhythmia, Pacemakers, and the ICD.   Cardiac Procedures: - Group verbal and written instruction and models  to describe the testing methods done to diagnose heart disease. Reviews the outcomes of the test results. Describes the treatment choices: Medical Management, Angioplasty, or Coronary Bypass Surgery.   Cardiac Medications: - Group verbal and written instruction to review commonly prescribed medications for heart disease. Reviews the medication, class of the drug, and side effects. Includes the steps to properly store meds and maintain the prescription regimen.   Go Sex-Intimacy & Heart Disease, Get SMART - Goal Setting: - Group verbal and written instruction through game format to discuss heart disease and the return to sexual intimacy. Provides group verbal and written material to discuss and apply goal setting through the application of the S.M.A.R.T. Method.   Other Matters of the Heart: - Provides group verbal, written materials and models to describe Heart Failure, Angina, Valve Disease, and Diabetes in the realm of heart disease. Includes description of the disease process and treatment options available to the cardiac patient.   Exercise & Equipment Safety: - Individual verbal instruction and demonstration of equipment use and safety with use of the equipment.      Cardiac Rehab from 09/10/2015 in [facility] Cardiac Rehab   Date  08/28/15   Educator  DW   Instruction Review Code  1- partially meets, needs review/practice      Infection Prevention: - Provides verbal and  written material to individual with discussion of infection control including proper hand washing and proper equipment cleaning during exercise session.      Cardiac Rehab from 09/10/2015 in [facility] Cardiac Rehab   Date  08/28/15   Educator  DW   Instruction Review Code  2- meets goals/outcomes      Falls Prevention: - Provides verbal and written material to individual with discussion of falls prevention and safety.      Cardiac Rehab from 09/10/2015 in [facility] Cardiac Rehab   Date  08/28/15   Educator  DW   Instruction Review Code  2- meets goals/outcomes      Diabetes: - Individual verbal and written instruction to review signs/symptoms of diabetes, desired ranges of glucose level fasting, after meals and with exercise. Advice that pre and post exercise glucose checks will be done for 3 sessions at entry of program.      Cardiac Rehab from 09/10/2015 in [facility] Cardiac Rehab   Date  08/28/15   Educator  DW   Instruction Review Code  2- meets goals/outcomes       Knowledge Questionnaire Score:     Knowledge Questionnaire Score - 08/28/15 1858    Knowledge Questionnaire Score   Pre Score 23/28      Personal Goals and Risk Factors at Admission:     Personal Goals and Risk Factors at Admission - 08/28/15 1902    Personal Goals and Risk Factors on Admission    Weight Management Yes   Intervention Learn and follow the exercise and diet guidelines while in the program. Utilize the nutrition and education classes to help gain knowledge of the diet and exercise expectations in the program   Admit Weight 222 lb 8 oz (100.925 kg)   Goal Weight 205 lb (92.987 kg)   Increase Aerobic Exercise and Physical Activity Yes   Intervention While in program, learn and follow the exercise prescription taught. Start at a low level workload and increase workload after able to maintain previous level for 30 minutes. Increase time before increasing intensity.   Diabetes Yes   Goal Blood glucose control  identified by blood glucose values, HgbA1C.  Participant verbalizes understanding of the signs/symptoms of hyper/hypo glycemia, proper foot care and importance of medication and nutrition plan for blood glucose control.   Intervention Provide nutrition & aerobic exercise along with prescribed medications to achieve blood glucose in normal ranges: Fasting 65-99 mg/dL   Hypertension Yes   Goal Participant will see blood pressure controlled within the values of 140/76mm/Hg or within value directed by their physician.   Intervention Provide nutrition & aerobic exercise along with prescribed medications to achieve BP 140/90 or less.   Lipids Yes   Goal Cholesterol controlled with medications as prescribed, with individualized exercise RX and with personalized nutrition plan. Value goals: LDL < 70mg , HDL > 40mg . Participant states understanding of desired cholesterol values and following prescriptions.   Intervention Provide nutrition & aerobic exercise along with prescribed medications to achieve LDL 70mg , HDL >40mg .   Stress Yes   Goal To meet with psychosocial counselor for stress and relaxation information and guidance. To state understanding of performing relaxation techniques and or identifying personal stressors.   Intervention Provide education on types of stress, identifiying stressors, and ways to cope with stress. Provide demonstration and active practice of relaxation techniques.      Personal Goals and Risk Factors Review:      Goals and Risk Factor Review      09/11/15 0909           Increase Aerobic Exercise and Physical Activity   Goals Progress/Improvement seen  Yes       Comments Shannon Chung has increased his Cardiac Rehab exercise workloads.        Diabetes   Goal --  Stable blood sugars for Shannon Chung.        Hypertension   Goal --  Shannon Chung's blood pressure has been good.           Personal Goals Discharge (Final Personal Goals and Risk Factors Review):      Goals and Risk Factor  Review - 09/11/15 0909    Increase Aerobic Exercise and Physical Activity   Goals Progress/Improvement seen  Yes   Comments Shannon Chung has increased his Cardiac Rehab exercise workloads.    Diabetes   Goal --  Stable blood sugars for Shannon Chung.    Hypertension   Goal --  Shannon Chung's blood pressure has been good.        Comments: 30 day review.

## 2015-09-12 ENCOUNTER — Encounter: Payer: Self-pay | Admitting: Nurse Practitioner

## 2015-09-12 DIAGNOSIS — Z951 Presence of aortocoronary bypass graft: Secondary | ICD-10-CM

## 2015-09-12 LAB — GLUCOSE, CAPILLARY
Glucose-Capillary: 163 mg/dL — ABNORMAL HIGH (ref 65–99)
Glucose-Capillary: 81 mg/dL (ref 65–99)

## 2015-09-12 NOTE — Progress Notes (Signed)
Daily Session Note  Patient Details  Name: Emmanuell Kantz MRN: [id number] Date of Birth: September 28, 1941 Referring Provider:  Ria Bush, MD  Encounter Date: 09/12/2015  Check In:     Session Check In - 09/12/15 0855    Check-In   Staff Present Lestine Box BS, ACSM EP-C, Exercise Physiologist;Carroll Enterkin RN, BSN;Other   ER physicians immediately available to respond to emergencies See telemetry face sheet for immediately available ER MD   Medication changes reported     No   Fall or balance concerns reported    No   Warm-up and Cool-down Performed on first and last piece of equipment   VAD Patient? No   Pain Assessment   Currently in Pain? No/denies         Goals Met:  Proper associated with RPD/PD & O2 Sat Exercise tolerated well No report of cardiac concerns or symptoms Strength training completed today  Goals Unmet:  Not Applicable  Goals Comments:    Dr. Emily Filbert is Medical Director for [place] and [facility].

## 2015-09-16 ENCOUNTER — Encounter: Payer: Medicare Other | Admitting: Family Medicine

## 2015-09-17 DIAGNOSIS — Z951 Presence of aortocoronary bypass graft: Secondary | ICD-10-CM | POA: Diagnosis not present

## 2015-09-17 LAB — GLUCOSE, CAPILLARY
GLUCOSE-CAPILLARY: 131 mg/dL — AB (ref 65–99)
GLUCOSE-CAPILLARY: 105 mg/dL — AB (ref 65–99)

## 2015-09-17 NOTE — Progress Notes (Signed)
Daily Session Note  Patient Details  Name: Shannon Chung MRN: [id number] Date of Birth: 01-29-1941 Referring Provider:  Minna Merritts, MD  Encounter Date: 09/17/2015  Check In:     Session Check In - 09/17/15 0920    Check-In   Staff Present Candiss Norse MS, ACSM CEP Exercise Physiologist;Other;Diane Mariana Arn, BSN   ER physicians immediately available to respond to emergencies See telemetry face sheet for immediately available ER MD   Medication changes reported     No   Fall or balance concerns reported    No   Warm-up and Cool-down Performed on first and last piece of equipment   VAD Patient? No   Pain Assessment   Currently in Pain? No/denies           Exercise Prescription Changes - 09/17/15 0900    Exercise Review   Progression Yes   Response to Exercise   Symptoms No   Comments I spoke with patient about exercise prescription and he was able to complete his increases in class today with no signs or symptoms.    Duration Progress to 30 minutes of continuous aerobic without signs/symptoms of physical distress   Intensity Rest + 30   Progression Continue progressive overload as per policy without signs/symptoms or physical distress.   Resistance Training   Training Prescription Yes   Weight 3   Reps 10-15   Interval Training   Interval Training No   Recumbant Elliptical   Level 5  =Bio   Watts 40   Minutes 15   REL-XR   Level 6   Watts 80   Minutes 15      Goals Met:  Independence with exercise equipment Exercise tolerated well No report of cardiac concerns or symptoms Strength training completed today  Goals Unmet:  Not Applicable  Goals Comments:    Dr. Emily Filbert is Medical Director for [place] and [facility].

## 2015-09-18 LAB — HM DIABETES EYE EXAM

## 2015-09-20 ENCOUNTER — Encounter: Payer: Medicare Other | Admitting: *Deleted

## 2015-09-20 DIAGNOSIS — Z951 Presence of aortocoronary bypass graft: Secondary | ICD-10-CM | POA: Diagnosis not present

## 2015-09-20 NOTE — Progress Notes (Signed)
Daily Session Note  Patient Details  Name: Shannon Chung MRN: [id number] Date of Birth: Feb 15, 1941 Referring Provider:  Minna Merritts, MD  Encounter Date: 09/20/2015  Check In:     Session Check In - 09/20/15 0946    Check-In   Staff Present Candiss Norse MS, ACSM CEP Exercise Physiologist;Carroll Enterkin RN, BSN;Susanne Bice RN, BSN, [place]   ER physicians immediately available to respond to emergencies See telemetry face sheet for immediately available ER MD   Medication changes reported     No   Fall or balance concerns reported    No   Warm-up and Cool-down Performed on first and last piece of equipment   VAD Patient? No   Pain Assessment   Currently in Pain? No/denies   Multiple Pain Sites No         Goals Met:  Independence with exercise equipment Exercise tolerated well No report of cardiac concerns or symptoms Strength training completed today  Goals Unmet:  Not Applicable  Goals Comments: Patient completed exercise prescription and all exercise goals during rehab session. The exercise was tolerated well and the patient is progressing in the program.    Dr. Emily Filbert is Medical Director for [place] and [facility].

## 2015-09-24 DIAGNOSIS — Z951 Presence of aortocoronary bypass graft: Secondary | ICD-10-CM | POA: Diagnosis not present

## 2015-09-24 NOTE — Progress Notes (Signed)
Daily Session Note  Patient Details  Name: Shannon Chung MRN: [id number] Date of Birth: 1941/03/03 Referring Provider:  Minna Merritts, MD  Encounter Date: 09/24/2015  Check In:     Session Check In - 09/24/15 1102    Check-In   Staff Present Nyoka Cowden, RN;Carroll Enterkin, RN, BSN;Audreena Sachdeva, BS, ACSM EP-C, Exercise Physiologist   ER physicians immediately available to respond to emergencies See telemetry face sheet for immediately available ER MD   Medication changes reported     No   Fall or balance concerns reported    No   Warm-up and Cool-down Performed on first and last piece of equipment   VAD Patient? No   Pain Assessment   Currently in Pain? No/denies         Goals Met:  Proper associated with RPD/PD & O2 Sat Exercise tolerated well No report of cardiac concerns or symptoms Strength training completed today  Goals Unmet:  Not Applicable  Goals Comments:    Dr. Emily Filbert is Medical Director for [place] and [facility].

## 2015-09-24 NOTE — Progress Notes (Signed)
Cardiac Individual Treatment Plan  Patient Details  Name: Shannon Chung MRN: [id number] Date of Birth: July 23, 1941 Referring Provider:  Minna Merritts, MD  Initial Encounter Date:    Visit Diagnosis: S/P CABG x 5  Patient's Home Medications on Admission:  Current outpatient prescriptions:  .  acetaminophen (TYLENOL) 500 MG tablet, Take 500 mg by mouth every 6 (six) hours as needed for mild pain. , Disp: , Rfl:  .  aspirin (ASPIRIN EC) 81 MG EC tablet, Take 81 mg by mouth daily. Swallow whole., Disp: , Rfl:  .  fluticasone (FLONASE) 50 MCG/ACT nasal spray, Place 1 spray into both nostrils as needed for allergies. , Disp: , Rfl:  .  lansoprazole (PREVACID) 30 MG capsule, Take 1 capsule (30 mg total) by mouth every morning., Disp: 90 capsule, Rfl: 3 .  lisinopril (PRINIVIL,ZESTRIL) 2.5 MG tablet, Take 1 tablet (2.5 mg total) by mouth daily., Disp: 90 tablet, Rfl: 3 .  meloxicam (MOBIC) 15 MG tablet, Take 7.5 mg by mouth once a week. Saturday or sunday, Disp: , Rfl: 1 .  metoprolol succinate (TOPROL-XL) 25 MG 24 hr tablet, Take 1 tablet (25 mg total) by mouth daily., Disp: 90 tablet, Rfl: 3 .  ranitidine (ZANTAC) 150 MG tablet, Take 1 tablet (150 mg total) by mouth at bedtime., Disp: 180 tablet, Rfl: 1 .  simvastatin (ZOCOR) 40 MG tablet, Take 1 tablet (40 mg total) by mouth daily at 6 PM., Disp: 90 tablet, Rfl: 3  Past Medical History: Past Medical History  Diagnosis Date  . Skin cancer     squamous and basal, sees derm regularly  . 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 [place] thrombus of cardiac apex ([place])     a. 06/2014: LV; resolved with coumadin-->no residual on f/u echo, no longer on coumadin.  . Ischemic cardiomyopathy     a. dilated, EF 35% improved to 45-50% (2015);  b. 07/2015 EF 25-35% by LV gram.  . History of radiation exposure     right vocal cord squamous cell cancer  . Essential hypertension   . Dyslipidemia   . [place] atrial fibrillation ([place])  1983    a. isolated episode, not on [place].  Marland Kitchen Vitamin D deficiency   . Coronary artery disease     a. 06/2015 Cardiac CT: Ca score 1103 (84th %'ile);  b. 07/2015 Cath: LM 70, LAD 80p, 100/31m D1 70, D2 95, RI 75, RCA 100p/m;  c. 07/2015 CABG x 5 (LIMA->LAD, VG->Diag, VG->OM1->OM2, VG->OM3).  . Osteoarthritis     a. R-shoulder, L-knee ([place])  . Diabetes mellitus without complication ([place] 93/6629   Tobacco Use: History  Smoking status  . Former Smoker -- 0.50 packs/day for 10 years  . Types: Cigarettes  . Quit date: 11/02/1978  Smokeless tobacco  . Never Used    Labs: Recent Review Flowsheet Data    Labs for ITP Cardiac and Pulmonary Rehab Latest Ref Rng 07/29/2015 07/29/2015 07/29/2015 07/29/2015 07/29/2015   PHART 7.350 - 7.450 - 7.379 7.345(L) - 7.333(L)   PCO2ART 35.0 - 45.0 mmHg - 36.4 45.5(H) - 45.2(H)   HCO3 20.0 - 24.0 mEq/L - 21.7 24.8(H) - 24.0   TCO2 0 - 100 mmol/L 22 23 26 23 25    ACIDBASEDEF 0.0 - 2.0 mmol/L - 3.0(H) 1.0 - 2.0   O2SAT - - 91.0 95.0 - 96.0       Exercise Target Goals:    Exercise Program Goal: Individual exercise  prescription set with THRR, safety & activity barriers. Participant demonstrates ability to understand and report RPE using BORG scale, to self-measure pulse accurately, and to acknowledge the importance of the exercise prescription.  Exercise Prescription Goal: Starting with aerobic activity 30 plus minutes a day, 3 days per week for initial exercise prescription. Provide home exercise prescription and guidelines that participant acknowledges understanding prior to discharge.  Activity Barriers & Risk Stratification:     Activity Barriers & Risk Stratification - 08/28/15 1858    Activity Barriers & Risk Stratification   Risk Stratification High      6 Minute Walk:     6 Minute Walk      08/28/15 1420       6 Minute Walk   Phase Initial     Distance 1330 feet     Walk Time 6 minutes     Resting HR 65 bpm     Resting BP  132/78 mmHg     Max Ex. HR 107 bpm     Max Ex. BP 144/60 mmHg     RPE 13     Symptoms Yes (comment)     Comments L knee pain due to arthritis 5/10 pain        Initial Exercise Prescription:     Initial Exercise Prescription - 08/28/15 1400    Date of Initial Exercise Prescription   Date 08/28/15   Treadmill   MPH 2.2   Grade 0   Minutes 10   Bike   Level 0.4   Minutes 15   Recumbant Bike   Level 3   RPM 40   Watts 30   Minutes 15   NuStep   Level 3   Watts 30   Minutes 15   Arm Ergometer   Level 1   Watts 8   Minutes 10   Arm/Foot Ergometer   Level 4   Watts 12   Minutes 10   Cybex   Level 3   RPM 50   Minutes 10   Recumbant Elliptical   Level 1   RPM 40   Watts 10   Minutes 10   Elliptical   Level 1   Speed 3   Minutes 1   REL-XR   Level 3   Watts 40   Minutes 15   Prescription Details   Frequency (times per week) 3   Duration Progress to 30 minutes of continuous aerobic without signs/symptoms of physical distress   Intensity   THRR REST +  30   Ratings of Perceived Exertion 11-15   Progression Continue progressive overload as per policy without signs/symptoms or physical distress.   Resistance Training   Training Prescription Yes   Weight 2   Reps 10-15      Exercise Prescription Changes:     Exercise Prescription Changes      09/10/15 1100 09/17/15 0900         Exercise Review   Progression No Yes      Response to Exercise   Blood Pressure (Admit) 110/68 mmHg       Blood Pressure (Exercise) 130/72 mmHg       Blood Pressure (Exit) 106/68 mmHg       Heart Rate (Admit) 71 bpm       Heart Rate (Exercise) 80 bpm       Heart Rate (Exit) 67 bpm       Rating of Perceived Exertion (Exercise) 14       Symptoms  No No      Comments Patient tolerated exercise well on his first day of class without signs or symptoms. I spoke with patient about exercise prescription and he was able to complete his increases in class today with no signs or  symptoms.       Duration Progress to 30 minutes of continuous aerobic without signs/symptoms of physical distress Progress to 30 minutes of continuous aerobic without signs/symptoms of physical distress      Intensity Rest + 30 Rest + 30      Progression Continue progressive overload as per policy without signs/symptoms or physical distress. Continue progressive overload as per policy without signs/symptoms or physical distress.      Resistance Training   Training Prescription Yes Yes      Weight 3 3      Reps 10-15 10-15      Interval Training   Interval Training No No      Recumbant Elliptical   Level 5  =Bio 5  =Bio      Watts 40 40      Minutes 15 15      REL-XR   Level 4 6      Watts 58 80      Minutes 15 15         Discharge Exercise Prescription (Final Exercise Prescription Changes):     Exercise Prescription Changes - 09/17/15 0900    Exercise Review   Progression Yes   Response to Exercise   Symptoms No   Comments I spoke with patient about exercise prescription and he was able to complete his increases in class today with no signs or symptoms.    Duration Progress to 30 minutes of continuous aerobic without signs/symptoms of physical distress   Intensity Rest + 30   Progression Continue progressive overload as per policy without signs/symptoms or physical distress.   Resistance Training   Training Prescription Yes   Weight 3   Reps 10-15   Interval Training   Interval Training No   Recumbant Elliptical   Level 5  =Bio   Watts 40   Minutes 15   REL-XR   Level 6   Watts 80   Minutes 15      Nutrition:  Target Goals: Understanding of nutrition guidelines, daily intake of sodium 1500mg , cholesterol 200mg , calories 30% from fat and 7% or less from saturated fats, daily to have 5 or more servings of fruits and vegetables.  Biometrics:     Pre Biometrics - 08/28/15 1413    Pre Biometrics   Height 6\' 2"  (1.88 m)   Weight 222 lb 8 oz (100.925 kg)    Waist Circumference 40.25 inches   Hip Circumference 41.5 inches   Waist to Hip Ratio 0.97 %   BMI (Calculated) 28.6       Nutrition Therapy Plan and Nutrition Goals:   Nutrition Discharge: Rate Your Plate Scores:   Nutrition Goals Re-Evaluation:     Nutrition Goals Re-Evaluation      09/24/15 1002           Personal Goal #1 Re-Evaluation   Personal Goal #1 Lower salt       Goal Progress Seen Yes       Comments Marcell reports that he and his wife are label readers now after his surgery and they have cut out all salt. Erron reports they eat alot of fish and practically no red meat. 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           Psychosocial: Target Goals: Acknowledge presence or absence of depression, maximize coping skills, provide positive support system. Participant is able to verbalize types and ability to use techniques and skills needed for reducing stress and depression.  Initial Review & Psychosocial Screening:     Initial Psych Review & Screening - 08/28/15 [place]? Yes   Comments Patient has fiancee, Rachel Moulds; two daughters and 5 grandchildren.  Patient is affliated with [facility]     Barriers   Psychosocial barriers to participate in program There are no identifiable barriers or psychosocial needs.  Patient stated one year ago is was really stressed when his daughter had cancer and he was helping take care of his grandchildren.  He was worried about his daughter.  She is cancer free at this time.     Screening Interventions   Interventions Encouraged to exercise      Quality of Life Scores:   PHQ-9:     Recent Review Flowsheet Data    Depression screen [facility] 2/9 08/28/2015   Decreased Interest 0   Down, Depressed, Hopeless 0   PHQ - 2 Score 0   Altered sleeping 1   Tired, decreased energy 1   Change in appetite 0   Feeling bad or failure about yourself  0   Trouble concentrating 0    Moving slowly or fidgety/restless 0   Suicidal thoughts 0   PHQ-9 Score 2   Difficult doing work/chores Not difficult at all      Psychosocial Evaluation and Intervention:   Psychosocial Re-Evaluation:     Psychosocial Re-Evaluation      09/24/15 1017           Psychosocial Re-Evaluation   Interventions Encouraged to attend Cardiac Rehabilitation for the exercise       Comments Dicky says Cardiac REhab has increased his strength every week.           Vocational Rehabilitation: Provide vocational rehab assistance to qualifying candidates.   Vocational Rehab Evaluation & Intervention:     Vocational Rehab - 08/28/15 1900    Initial Vocational Rehab Evaluation & Intervention   Assessment shows need for Vocational Rehabilitation No      Education: Education Goals: Education classes will be provided on a weekly basis, covering required topics. Participant will state understanding/return demonstration of topics presented.  Learning Barriers/Preferences:     Learning Barriers/Preferences - 08/28/15 1858    Learning Barriers/Preferences   Learning Barriers Sight;Exercise Concerns   Learning Preferences Written [place]      Education Topics: General Nutrition Guidelines/Fats and Fiber: -Group instruction provided by verbal, written material, models and posters to present the general guidelines for heart healthy nutrition. Gives an explanation and review of dietary fats and fiber.   Controlling Sodium/Reading Food Labels: -Group verbal and written material supporting the discussion of sodium use in heart healthy nutrition. Review and explanation with models, verbal and written materials for utilization of the food label.   Exercise Physiology & Risk Factors: - Group verbal and written instruction with models to review the exercise physiology of the cardiovascular system and associated critical values. Details cardiovascular disease risk factors and the goals  associated with each risk factor.   Aerobic Exercise & Resistance Training: - Gives group verbal and written discussion on the health impact of inactivity. On the components of aerobic and resistive training programs and the benefits of  this training and how to safely progress through these programs.          Cardiac Rehab from 09/24/2015 in [facility] Cardiac Rehab   Date  09/10/15   Educator  RM   Instruction Review Code  2- meets goals/outcomes      Flexibility, Balance, General Exercise Guidelines: - Provides group verbal and written instruction on the benefits of flexibility and balance training programs. Provides general exercise guidelines with specific guidelines to those with heart or lung disease. Demonstration and skill practice provided.      Cardiac Rehab from 09/24/2015 in [facility] Cardiac Rehab   Date  09/17/15   Educator  RM   Instruction Review Code  2- meets goals/outcomes      Stress Management: - Provides group verbal and written instruction about the health risks of elevated stress, cause of high stress, and healthy ways to reduce stress.   Depression: - Provides group verbal and written instruction on the correlation between heart/lung disease and depressed mood, treatment options, and the stigmas associated with seeking treatment.   Anatomy & Physiology of the Heart: - Group verbal and written instruction and models provide basic cardiac anatomy and physiology, with the coronary electrical and arterial systems. Review of: AMI, Angina, Valve disease, Heart Failure, Cardiac Arrhythmia, Pacemakers, and the ICD.      Cardiac Rehab from 09/24/2015 in [facility] Cardiac Rehab   Date  09/24/15   Educator  [place]   Instruction Review Code  2- meets goals/outcomes      Cardiac Procedures: - Group verbal and written instruction and models to describe the testing methods done to diagnose heart disease. Reviews the outcomes of the test results. Describes the treatment choices:  Medical Management, Angioplasty, or Coronary Bypass Surgery.   Cardiac Medications: - Group verbal and written instruction to review commonly prescribed medications for heart disease. Reviews the medication, class of the drug, and side effects. Includes the steps to properly store meds and maintain the prescription regimen.      Cardiac Rehab from 09/24/2015 in [facility] Cardiac Rehab   Date  09/12/15   Educator  CE   Instruction Review Code  2- meets goals/outcomes      Go Sex-Intimacy & Heart Disease, Get SMART - Goal Setting: - Group verbal and written instruction through game format to discuss heart disease and the return to sexual intimacy. Provides group verbal and written material to discuss and apply goal setting through the application of the S.M.A.R.T. Method.   Other Matters of the Heart: - Provides group verbal, written materials and models to describe Heart Failure, Angina, Valve Disease, and Diabetes in the realm of heart disease. Includes description of the disease process and treatment options available to the cardiac patient.   Exercise & Equipment Safety: - Individual verbal instruction and demonstration of equipment use and safety with use of the equipment.      Cardiac Rehab from 09/24/2015 in [facility] Cardiac Rehab   Date  08/28/15   Educator  DW   Instruction Review Code  1- partially meets, needs review/practice      Infection Prevention: - Provides verbal and written material to individual with discussion of infection control including proper hand washing and proper equipment cleaning during exercise session.      Cardiac Rehab from 09/24/2015 in [facility] Cardiac Rehab   Date  08/28/15   Educator  DW   Instruction Review Code  2- meets goals/outcomes      Falls Prevention: - Provides verbal and  written material to individual with discussion of falls prevention and safety.      Cardiac Rehab from 09/24/2015 in [facility] Cardiac Rehab   Date  08/28/15   Educator  DW    Instruction Review Code  2- meets goals/outcomes      Diabetes: - Individual verbal and written instruction to review signs/symptoms of diabetes, desired ranges of glucose level fasting, after meals and with exercise. Advice that pre and post exercise glucose checks will be done for 3 sessions at entry of program.      Cardiac Rehab from 09/24/2015 in [facility] Cardiac Rehab   Date  08/28/15   Educator  DW   Instruction Review Code  2- meets goals/outcomes       Knowledge Questionnaire Score:     Knowledge Questionnaire Score - 08/28/15 1858    Knowledge Questionnaire Score   Pre Score 23/28      Personal Goals and Risk Factors at Admission:     Personal Goals and Risk Factors at Admission - 08/28/15 1902    Personal Goals and Risk Factors on Admission    Weight Management Yes   Intervention Learn and follow the exercise and diet guidelines while in the program. Utilize the nutrition and education classes to help gain knowledge of the diet and exercise expectations in the program   Admit Weight 222 lb 8 oz (100.925 kg)   Goal Weight 205 lb (92.987 kg)   Increase Aerobic Exercise and Physical Activity Yes   Intervention While in program, learn and follow the exercise prescription taught. Start at a low level workload and increase workload after able to maintain previous level for 30 minutes. Increase time before increasing intensity.   Diabetes Yes   Goal Blood glucose control identified by blood glucose values, HgbA1C. Participant verbalizes understanding of the signs/symptoms of hyper/hypo glycemia, proper foot care and importance of medication and nutrition plan for blood glucose control.   Intervention Provide nutrition & aerobic exercise along with prescribed medications to achieve blood glucose in normal ranges: Fasting 65-99 mg/dL   Hypertension Yes   Goal Participant will see blood pressure controlled within the values of 140/48mm/Hg or within value directed by their  physician.   Intervention Provide nutrition & aerobic exercise along with prescribed medications to achieve BP 140/90 or less.   Lipids Yes   Goal Cholesterol controlled with medications as prescribed, with individualized exercise RX and with personalized nutrition plan. Value goals: LDL < 70mg , HDL > 40mg . Participant states understanding of desired cholesterol values and following prescriptions.   Intervention Provide nutrition & aerobic exercise along with prescribed medications to achieve LDL 70mg , HDL >40mg .   Stress Yes   Goal To meet with psychosocial counselor for stress and relaxation information and guidance. To state understanding of performing relaxation techniques and or identifying personal stressors.   Intervention Provide education on types of stress, identifiying stressors, and ways to cope with stress. Provide demonstration and active practice of relaxation techniques.      Personal Goals and Risk Factors Review:      Goals and Risk Factor Review      09/11/15 0909 09/24/15 1014         Increase Aerobic Exercise and Physical Activity   Goals Progress/Improvement seen  Yes Yes      Comments Elijahjuan has increased his Cardiac Rehab exercise workloads.  Trevonne reports that he has been 9 weeks out of surgery and he feels like he is getting stronger every week. Baltazar reports  he is getting around better especially in a store.       Diabetes   Goal --  Stable blood sugars for [place].  --  Stable blood sugars.       Hypertension   Goal --  Bentley's blood pressure has been good.  --  Jenny Reichmann 's blood pressure has been very good in Cardiac Rehb.       Abnormal Lipids   Goal  --  Royer will get his lipid blood work rechecked by his MD in the future.       Stress   Goal  --  Johns stress has decreased since starting Cardiac Rehab.          Personal Goals Discharge (Final Personal Goals and Risk Factors Review):      Goals and Risk Factor Review - 09/24/15 1014    Increase Aerobic  Exercise and Physical Activity   Goals Progress/Improvement seen  Yes   Comments Zachariah reports that he has been 9 weeks out of surgery and he feels like he is getting stronger every week. Dash reports he is getting around better especially in a store.    Diabetes   Goal --  Stable blood sugars.    Hypertension   Goal --  Jenny Reichmann 's blood pressure has been very good in Cardiac Rehb.    Abnormal Lipids   Goal --  Marwin will get his lipid blood work rechecked by his MD in the future.    Stress   Goal --  Johns stress has decreased since starting Cardiac Rehab.       ITP Comments:   Comments: Quentavius says his weakness is bread but he is eating thin bread. He reports he and his wife read labels. Santhosh said before his surgery his weight was 236 lbs then after surgery he was 226lbs and now 220 lbs but he wants to lose 10 more lbs.

## 2015-10-01 DIAGNOSIS — Z951 Presence of aortocoronary bypass graft: Secondary | ICD-10-CM | POA: Diagnosis not present

## 2015-10-01 NOTE — Progress Notes (Signed)
Daily Session Note  Patient Details  Name: Shannon Chung MRN: [id number] Date of Birth: 1940-11-14 Referring Provider:  Minna Merritts, MD  Encounter Date: 10/01/2015  Check In:     Session Check In - 10/01/15 1015    Check-In   Staff Present Lestine Box, BS, ACSM EP-C, Exercise Physiologist;Diane Joya Gaskins, RN, Drusilla Kanner, MS, ACSM CEP, Exercise Physiologist   ER physicians immediately available to respond to emergencies See telemetry face sheet for immediately available ER MD   Medication changes reported     No   Fall or balance concerns reported    No   Warm-up and Cool-down Performed on first and last piece of equipment   VAD Patient? No   Pain Assessment   Currently in Pain? No/denies   Multiple Pain Sites No           Exercise Prescription Changes - 10/01/15 1000    Exercise Review   Progression Yes   Response to Exercise   Symptoms No   Comments Discussed HIIT with Shannon Chung, he is going to start with it on Thursday and is excited for another challenge. Reviewed individualized exercise prescription and made increases per departmental policy. Exercise increases were discussed with the patient and they were able to perform the new work loads without issue (no signs or symptoms).    Duration Progress to 50 minutes of aerobic without signs/symptoms of physical distress   Intensity Rest + 30   Progression Continue progressive overload as per policy without signs/symptoms or physical distress.   Resistance Training   Training Prescription Yes   Weight 3   Reps 10-15   Interval Training   Interval Training Yes   Equipment REL-XR   Recumbant Elliptical   Level 5  =Bio   Watts 40   Minutes 15   REL-XR   Level 11   Watts 85   Minutes 35      Goals Met:  [place] with exercise equipment Exercise tolerated well Personal goals reviewed No report of cardiac concerns or symptoms Strength training completed today  Goals Unmet:  Not Applicable  Goals  Comments: Patient completed exercise prescription and all exercise goals during rehab session. The exercise was tolerated well and the patient is progressing in the program.    Dr. Emily Filbert is Medical Director for [place] and [facility].

## 2015-10-03 ENCOUNTER — Encounter: Payer: Medicare Other | Attending: Cardiovascular Disease

## 2015-10-03 DIAGNOSIS — M7541 Impingement syndrome of right shoulder: Secondary | ICD-10-CM

## 2015-10-03 DIAGNOSIS — Z951 Presence of aortocoronary bypass graft: Secondary | ICD-10-CM | POA: Diagnosis not present

## 2015-10-03 DIAGNOSIS — C44310 Basal cell carcinoma of skin of unspecified parts of face: Secondary | ICD-10-CM

## 2015-10-03 HISTORY — DX: Impingement syndrome of right shoulder: M75.41

## 2015-10-03 HISTORY — PX: SKIN CANCER EXCISION: SHX779

## 2015-10-03 HISTORY — DX: Basal cell carcinoma of skin of unspecified parts of face: C44.310

## 2015-10-03 NOTE — Progress Notes (Signed)
Daily Session Note  Patient Details  Name: Duvan Mousel MRN: [id number] Date of Birth: October 14, 1941 Referring Provider:  Minna Merritts, MD  Encounter Date: 10/03/2015  Check In:     Session Check In - 10/03/15 0901    Check-In   Staff Present Lestine Box, BS, ACSM EP-C, Exercise Physiologist;Kendall Caprice Beaver, BS, Exercise Physiologist;Carroll Enterkin, RN, BSN   ER physicians immediately available to respond to emergencies See telemetry face sheet for immediately available ER MD   Medication changes reported     No   Fall or balance concerns reported    No   Warm-up and Cool-down Performed on first and last piece of equipment   VAD Patient? No   Pain Assessment   Currently in Pain? No/denies         Goals Met:  Proper associated with RPD/PD & O2 Sat Exercise tolerated well No report of cardiac concerns or symptoms Strength training completed today  Goals Unmet:  Not Applicable  Goals Comments:    Dr. Emily Filbert is Medical Director for [place] and [facility].

## 2015-10-08 ENCOUNTER — Encounter: Payer: Medicare Other | Admitting: *Deleted

## 2015-10-08 DIAGNOSIS — Z951 Presence of aortocoronary bypass graft: Secondary | ICD-10-CM

## 2015-10-08 NOTE — Progress Notes (Signed)
Cardiac Individual Treatment Plan  Patient Details  Name: Shannon Chung MRN: [id number] Date of Birth: July 23, 1941 Referring Provider:  Minna Merritts, MD  Initial Encounter Date:    Visit Diagnosis: S/P CABG x 5  Patient's Home Medications on Admission:  Current outpatient prescriptions:  .  acetaminophen (TYLENOL) 500 MG tablet, Take 500 mg by mouth every 6 (six) hours as needed for mild pain. , Disp: , Rfl:  .  aspirin (ASPIRIN EC) 81 MG EC tablet, Take 81 mg by mouth daily. Swallow whole., Disp: , Rfl:  .  fluticasone (FLONASE) 50 MCG/ACT nasal spray, Place 1 spray into both nostrils as needed for allergies. , Disp: , Rfl:  .  lansoprazole (PREVACID) 30 MG capsule, Take 1 capsule (30 mg total) by mouth every morning., Disp: 90 capsule, Rfl: 3 .  lisinopril (PRINIVIL,ZESTRIL) 2.5 MG tablet, Take 1 tablet (2.5 mg total) by mouth daily., Disp: 90 tablet, Rfl: 3 .  meloxicam (MOBIC) 15 MG tablet, Take 7.5 mg by mouth once a week. Saturday or sunday, Disp: , Rfl: 1 .  metoprolol succinate (TOPROL-XL) 25 MG 24 hr tablet, Take 1 tablet (25 mg total) by mouth daily., Disp: 90 tablet, Rfl: 3 .  ranitidine (ZANTAC) 150 MG tablet, Take 1 tablet (150 mg total) by mouth at bedtime., Disp: 180 tablet, Rfl: 1 .  simvastatin (ZOCOR) 40 MG tablet, Take 1 tablet (40 mg total) by mouth daily at 6 PM., Disp: 90 tablet, Rfl: 3  Past Medical History: Past Medical History  Diagnosis Date  . Skin cancer     squamous and basal, sees derm regularly  . 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 [place] thrombus of cardiac apex ([place])     a. 06/2014: LV; resolved with coumadin-->no residual on f/u echo, no longer on coumadin.  . Ischemic cardiomyopathy     a. dilated, EF 35% improved to 45-50% (2015);  b. 07/2015 EF 25-35% by LV gram.  . History of radiation exposure     right vocal cord squamous cell cancer  . Essential hypertension   . Dyslipidemia   . [place] atrial fibrillation ([place])  1983    a. isolated episode, not on [place].  Marland Kitchen Vitamin D deficiency   . Coronary artery disease     a. 06/2015 Cardiac CT: Ca score 1103 (84th %'ile);  b. 07/2015 Cath: LM 70, LAD 80p, 100/31m D1 70, D2 95, RI 75, RCA 100p/m;  c. 07/2015 CABG x 5 (LIMA->LAD, VG->Diag, VG->OM1->OM2, VG->OM3).  . Osteoarthritis     a. R-shoulder, L-knee ([place])  . Diabetes mellitus without complication ([place] 93/6629   Tobacco Use: History  Smoking status  . Former Smoker -- 0.50 packs/day for 10 years  . Types: Cigarettes  . Quit date: 11/02/1978  Smokeless tobacco  . Never Used    Labs: Recent Review Flowsheet Data    Labs for ITP Cardiac and Pulmonary Rehab Latest Ref Rng 07/29/2015 07/29/2015 07/29/2015 07/29/2015 07/29/2015   PHART 7.350 - 7.450 - 7.379 7.345(L) - 7.333(L)   PCO2ART 35.0 - 45.0 mmHg - 36.4 45.5(H) - 45.2(H)   HCO3 20.0 - 24.0 mEq/L - 21.7 24.8(H) - 24.0   TCO2 0 - 100 mmol/L 22 23 26 23 25    ACIDBASEDEF 0.0 - 2.0 mmol/L - 3.0(H) 1.0 - 2.0   O2SAT - - 91.0 95.0 - 96.0       Exercise Target Goals:    Exercise Program Goal: Individual exercise  prescription set with THRR, safety & activity barriers. Participant demonstrates ability to understand and report RPE using BORG scale, to self-measure pulse accurately, and to acknowledge the importance of the exercise prescription.  Exercise Prescription Goal: Starting with aerobic activity 30 plus minutes a day, 3 days per week for initial exercise prescription. Provide home exercise prescription and guidelines that participant acknowledges understanding prior to discharge.  Activity Barriers & Risk Stratification:     Activity Barriers & Risk Stratification - 08/28/15 1858    Activity Barriers & Risk Stratification   Risk Stratification High      6 Minute Walk:     6 Minute Walk      08/28/15 1420       6 Minute Walk   Phase Initial     Distance 1330 feet     Walk Time 6 minutes     Resting HR 65 bpm     Resting BP  132/78 mmHg     Max Ex. HR 107 bpm     Max Ex. BP 144/60 mmHg     RPE 13     Symptoms Yes (comment)     Comments L knee pain due to arthritis 5/10 pain        Initial Exercise Prescription:     Initial Exercise Prescription - 08/28/15 1400    Date of Initial Exercise Prescription   Date 08/28/15   Treadmill   MPH 2.2   Grade 0   Minutes 10   Bike   Level 0.4   Minutes 15   Recumbant Bike   Level 3   RPM 40   Watts 30   Minutes 15   NuStep   Level 3   Watts 30   Minutes 15   Arm Ergometer   Level 1   Watts 8   Minutes 10   Arm/Foot Ergometer   Level 4   Watts 12   Minutes 10   Cybex   Level 3   RPM 50   Minutes 10   Recumbant Elliptical   Level 1   RPM 40   Watts 10   Minutes 10   Elliptical   Level 1   Speed 3   Minutes 1   REL-XR   Level 3   Watts 40   Minutes 15   Prescription Details   Frequency (times per week) 3   Duration Progress to 30 minutes of continuous aerobic without signs/symptoms of physical distress   Intensity   THRR REST +  30   Ratings of Perceived Exertion 11-15   Progression Continue progressive overload as per policy without signs/symptoms or physical distress.   Resistance Training   Training Prescription Yes   Weight 2   Reps 10-15      Exercise Prescription Changes:     Exercise Prescription Changes      09/10/15 1100 09/17/15 0900 09/17/15 1622 10/01/15 1000     Exercise Review   Progression No Yes No Yes    Response to Exercise   Blood Pressure (Admit) 110/68 mmHg  122/76 mmHg     Blood Pressure (Exercise) 130/72 mmHg  140/78 mmHg     Blood Pressure (Exit) 106/68 mmHg  120/70 mmHg     Heart Rate (Admit) 71 bpm  83 bpm     Heart Rate (Exercise) 80 bpm  85 bpm     Heart Rate (Exit) 67 bpm  62 bpm     Rating of Perceived Exertion (Exercise) 14  15     Symptoms No No no No    Comments Patient tolerated exercise well on his first day of class without signs or symptoms. I spoke with patient about exercise  prescription and he was able to complete his increases in class today with no signs or symptoms.   Discussed HIIT with Jenny Reichmann, he is going to start with it on Thursday and is excited for another challenge. Reviewed individualized exercise prescription and made increases per departmental policy. Exercise increases were discussed with the patient and they were able to perform the new work loads without issue (no signs or symptoms).     Duration Progress to 30 minutes of continuous aerobic without signs/symptoms of physical distress Progress to 30 minutes of continuous aerobic without signs/symptoms of physical distress Progress to 30 minutes of continuous aerobic without signs/symptoms of physical distress Progress to 50 minutes of aerobic without signs/symptoms of physical distress    Intensity Rest + 30 Rest + 30 Rest + 30 Rest + 30    Progression Continue progressive overload as per policy without signs/symptoms or physical distress. Continue progressive overload as per policy without signs/symptoms or physical distress. Continue progressive overload as per policy without signs/symptoms or physical distress. Continue progressive overload as per policy without signs/symptoms or physical distress.    Resistance Training   Training Prescription Yes Yes Yes Yes    Weight 3 3 3 3     Reps 10-15 10-15 10-15 10-15    Interval Training   Interval Training No No No Yes    Equipment    REL-XR    Recumbant Elliptical   Level 5  =Bio 5  =Bio 5 5  =Bio    Watts 40 40 40 40    Minutes 15 15 15 15     REL-XR   Level 4 6 6 11     Watts 58 80 80 85    Minutes 15 15 15  35       Discharge Exercise Prescription (Final Exercise Prescription Changes):     Exercise Prescription Changes - 10/01/15 1000    Exercise Review   Progression Yes   Response to Exercise   Symptoms No   Comments Discussed HIIT with Jenny Reichmann, he is going to start with it on Thursday and is excited for another challenge. Reviewed  individualized exercise prescription and made increases per departmental policy. Exercise increases were discussed with the patient and they were able to perform the new work loads without issue (no signs or symptoms).    Duration Progress to 50 minutes of aerobic without signs/symptoms of physical distress   Intensity Rest + 30   Progression Continue progressive overload as per policy without signs/symptoms or physical distress.   Resistance Training   Training Prescription Yes   Weight 3   Reps 10-15   Interval Training   Interval Training Yes   Equipment REL-XR   Recumbant Elliptical   Level 5  =Bio   Watts 40   Minutes 15   REL-XR   Level 11   Watts 85   Minutes 35      Nutrition:  Target Goals: Understanding of nutrition guidelines, daily intake of sodium <1562m, cholesterol <2088m calories 30% from fat and 7% or less from saturated fats, daily to have 5 or more servings of fruits and vegetables.  Biometrics:     Pre Biometrics - 08/28/15 1413    Pre Biometrics   Height 6' 2"  (1.88 m)   Weight 222 lb 8 oz (  100.925 kg)   Waist Circumference 40.25 inches   Hip Circumference 41.5 inches   Waist to Hip Ratio 0.97 %   BMI (Calculated) 28.6       Nutrition Therapy Plan and Nutrition Goals:     Nutrition Therapy & Goals - 10/01/15 1301    Nutrition Therapy   Diet Instructed on a heart healthy meal plan based on 1900 calories, DASH diet principles as well as diet principles for pre-diabetes   Drug/Food Interactions Statins/Certain Fruits   Fiber 30 grams   Whole Grain Foods 3 servings   Protein 8 ounces/day   Saturated Fats 13 max. grams   Fruits and Vegetables 5 servings/day   Personal Nutrition Goals   Personal Goal #1 To try some of the low sodium products suggested such as StarKist very low sodium tuna or Fiesta Lime Ms.DASH, or [place] low sodium spaghetti sauce   Personal Goal #2 Read labels for saturated fat, trans fat and sodium.   Personal Goal #3  Increase vegetable/fruit intake to minimum of 5 servings per day.   Personal Goal #4 Balance meals with protein, 2-4 servings of carbohydrate and non-starchy vegetables.      Nutrition Discharge: Rate Your Plate Scores:     Rate Your Plate - 59/16/38 4665    Rate Your Plate Scores   Pre Score 77   Pre Score % 92 %      Nutrition Goals Re-Evaluation:     Nutrition Goals Re-Evaluation      09/24/15 1002           Personal Goal #1 Re-Evaluation   Personal Goal #1 Lower salt       Goal Progress Seen Yes       Comments Shannon Chung reports that he and his wife are label readers now after his surgery and they have cut out all salt. Shannon Chung reports they eat alot of fish and practically no red meat. I made him a Cardiac Rehab registered dietician appt.           Psychosocial: Target Goals: Acknowledge presence or absence of depression, maximize coping skills, provide positive support system. Participant is able to verbalize types and ability to use techniques and skills needed for reducing stress and depression.  Initial Review & Psychosocial Screening:     Initial Psych Review & Screening - 08/28/15 [place]? Yes   Comments Patient has fiancee, Rachel Moulds; two daughters and 5 grandchildren.  Patient is affliated with [facility]     Barriers   Psychosocial barriers to participate in program There are no identifiable barriers or psychosocial needs.  Patient stated one year ago is was really stressed when his daughter had cancer and he was helping take care of his grandchildren.  He was worried about his daughter.  She is cancer free at this time.     Screening Interventions   Interventions Encouraged to exercise      Quality of Life Scores:   PHQ-9:     Recent Review Flowsheet Data    Depression screen [facility] 2/9 08/28/2015   Decreased Interest 0   Down, Depressed, Hopeless 0   PHQ - 2 Score 0   Altered sleeping 1   Tired, decreased  energy 1   Change in appetite 0   Feeling bad or failure about yourself  0   Trouble concentrating 0   Moving slowly or fidgety/restless 0   Suicidal thoughts 0   PHQ-9  Score 2   Difficult doing work/chores Not difficult at all      Psychosocial Evaluation and Intervention:     Psychosocial Evaluation - 10/01/15 0935    Psychosocial Evaluation & Interventions   Interventions Stress management education;Relaxation education;Encouraged to exercise with the program and follow exercise prescription   Comments Counselor met with Shannon Chung today (Shannon Chung) for the initial psychosocial evaluation.  He is a 74 year old who reports having open heart surgery 9 weeks ago.  He states he has a strong support system with a 23 year significant other and several adult daughters out of state.  Shannon Chung is awaiting knee replacement surgery once he recovers enough.  He also has arthritis and borderline diabetes.  He reports he sleeps well and has a good appetite.  He denies a history of depression or anxiety but recognizes that his younger daughter has been battling cancer for 5 years and this has been especially stressful for him.  Shannon Chung states his mood is "okay" currently having recently moved to [facility] and this has been stressful as well.  His Thanksgiving weekend with couples from  [place] was also "awkward" with everyone concerned about Mr. Cherlynn Polo health and this bothered him.  His goals are to lose weight and increase his stamina and strength in this program.  He states he is already experiencing some of the benefits in feeling stronger since beginning.  Counselor recommended Shannon Chung meet with the dietician to address his weight loss goals and he will also benefit from all of the psychoeducational components of this program, especially stress management.        Psychosocial Re-Evaluation:     Psychosocial Re-Evaluation      09/24/15 1017           Psychosocial Re-Evaluation   Interventions Encouraged to  attend Cardiac Rehabilitation for the exercise       Comments Shannon Chung says Cardiac REhab has increased his strength every week.           Vocational Rehabilitation: Provide vocational rehab assistance to qualifying candidates.   Vocational Rehab Evaluation & Intervention:     Vocational Rehab - 08/28/15 1900    Initial Vocational Rehab Evaluation & Intervention   Assessment shows need for Vocational Rehabilitation No      Education: Education Goals: Education classes will be provided on a weekly basis, covering required topics. Participant will state understanding/return demonstration of topics presented.  Learning Barriers/Preferences:     Learning Barriers/Preferences - 08/28/15 1858    Learning Barriers/Preferences   Learning Barriers Sight;Exercise Concerns   Learning Preferences Written [place]      Education Topics: General Nutrition Guidelines/Fats and Fiber: -Group instruction provided by verbal, written material, models and posters to present the general guidelines for heart healthy nutrition. Gives an explanation and review of dietary fats and fiber.          Cardiac Rehab from 10/08/2015 in [facility] Cardiac Rehab   Date  10/08/15   Educator  Erlene Quan   Instruction Review Code  2- meets goals/outcomes      Controlling Sodium/Reading Food Labels: -Group verbal and written material supporting the discussion of sodium use in heart healthy nutrition. Review and explanation with models, verbal and written materials for utilization of the food label.   Exercise Physiology & Risk Factors: - Group verbal and written instruction with models to review the exercise physiology of the cardiovascular system and associated critical values. Details cardiovascular disease risk factors and the  goals associated with each risk factor.   Aerobic Exercise & Resistance Training: - Gives group verbal and written discussion on the health impact of inactivity. On the components of  aerobic and resistive training programs and the benefits of this training and how to safely progress through these programs.      Cardiac Rehab from 10/08/2015 in [facility] Cardiac Rehab   Date  09/10/15   Educator  RM   Instruction Review Code  2- meets goals/outcomes      Flexibility, Balance, General Exercise Guidelines: - Provides group verbal and written instruction on the benefits of flexibility and balance training programs. Provides general exercise guidelines with specific guidelines to those with heart or lung disease. Demonstration and skill practice provided.      Cardiac Rehab from 10/08/2015 in [facility] Cardiac Rehab   Date  09/17/15   Educator  RM   Instruction Review Code  2- meets goals/outcomes      Stress Management: - Provides group verbal and written instruction about the health risks of elevated stress, cause of high stress, and healthy ways to reduce stress.   Depression: - Provides group verbal and written instruction on the correlation between heart/lung disease and depressed mood, treatment options, and the stigmas associated with seeking treatment.   Anatomy & Physiology of the Heart: - Group verbal and written instruction and models provide basic cardiac anatomy and physiology, with the coronary electrical and arterial systems. Review of: AMI, Angina, Valve disease, Heart Failure, Cardiac Arrhythmia, Pacemakers, and the ICD.      Cardiac Rehab from 10/08/2015 in [facility] Cardiac Rehab   Date  09/24/15   Educator  [place]   Instruction Review Code  2- meets goals/outcomes      Cardiac Procedures: - Group verbal and written instruction and models to describe the testing methods done to diagnose heart disease. Reviews the outcomes of the test results. Describes the treatment choices: Medical Management, Angioplasty, or Coronary Bypass Surgery.   Cardiac Medications: - Group verbal and written instruction to review commonly prescribed medications for heart disease. Reviews  the medication, class of the drug, and side effects. Includes the steps to properly store meds and maintain the prescription regimen.      Cardiac Rehab from 10/08/2015 in [facility] Cardiac Rehab   Date  09/12/15   Educator  CE   Instruction Review Code  2- meets goals/outcomes      Go Sex-Intimacy & Heart Disease, Get SMART - Goal Setting: - Group verbal and written instruction through game format to discuss heart disease and the return to sexual intimacy. Provides group verbal and written material to discuss and apply goal setting through the application of the S.M.A.R.T. Method.   Other Matters of the Heart: - Provides group verbal, written materials and models to describe Heart Failure, Angina, Valve Disease, and Diabetes in the realm of heart disease. Includes description of the disease process and treatment options available to the cardiac patient.      Cardiac Rehab from 10/08/2015 in [facility] Cardiac Rehab   Date  10/03/15   Educator  CE   Instruction Review Code  2- meets goals/outcomes      Exercise & Equipment Safety: - Individual verbal instruction and demonstration of equipment use and safety with use of the equipment.      Cardiac Rehab from 10/08/2015 in [facility] Cardiac Rehab   Date  08/28/15   Educator  DW   Instruction Review Code  1- partially meets, needs review/practice  Infection Prevention: - Provides verbal and written material to individual with discussion of infection control including proper hand washing and proper equipment cleaning during exercise session.      Cardiac Rehab from 10/08/2015 in [facility] Cardiac Rehab   Date  08/28/15   Educator  DW   Instruction Review Code  2- meets goals/outcomes      Falls Prevention: - Provides verbal and written material to individual with discussion of falls prevention and safety.      Cardiac Rehab from 10/08/2015 in [facility] Cardiac Rehab   Date  08/28/15   Educator  DW   Instruction Review Code  2- meets goals/outcomes       Diabetes: - Individual verbal and written instruction to review signs/symptoms of diabetes, desired ranges of glucose level fasting, after meals and with exercise. Advice that pre and post exercise glucose checks will be done for 3 sessions at entry of program.      Cardiac Rehab from 10/08/2015 in [facility] Cardiac Rehab   Date  08/28/15   Educator  DW   Instruction Review Code  2- meets goals/outcomes       Knowledge Questionnaire Score:     Knowledge Questionnaire Score - 08/28/15 1858    Knowledge Questionnaire Score   Pre Score 23/28      Personal Goals and Risk Factors at Admission:     Personal Goals and Risk Factors at Admission - 08/28/15 1902    Personal Goals and Risk Factors on Admission    Weight Management Yes   Intervention Learn and follow the exercise and diet guidelines while in the program. Utilize the nutrition and education classes to help gain knowledge of the diet and exercise expectations in the program   Admit Weight 222 lb 8 oz (100.925 kg)   Goal Weight 205 lb (92.987 kg)   Increase Aerobic Exercise and Physical Activity Yes   Intervention While in program, learn and follow the exercise prescription taught. Start at a low level workload and increase workload after able to maintain previous level for 30 minutes. Increase time before increasing intensity.   Diabetes Yes   Goal Blood glucose control identified by blood glucose values, HgbA1C. Participant verbalizes understanding of the signs/symptoms of hyper/hypo glycemia, proper foot care and importance of medication and nutrition plan for blood glucose control.   Intervention Provide nutrition & aerobic exercise along with prescribed medications to achieve blood glucose in normal ranges: Fasting 65-99 mg/dL   Hypertension Yes   Goal Participant will see blood pressure controlled within the values of 140/68m/Hg or within value directed by their physician.   Intervention Provide nutrition & aerobic  exercise along with prescribed medications to achieve BP 140/90 or less.   Lipids Yes   Goal Cholesterol controlled with medications as prescribed, with individualized exercise RX and with personalized nutrition plan. Value goals: LDL < 738m HDL > 4093mParticipant states understanding of desired cholesterol values and following prescriptions.   Intervention Provide nutrition & aerobic exercise along with prescribed medications to achieve LDL <47m51mDL >40mg32mStress Yes   Goal To meet with psychosocial counselor for stress and relaxation information and guidance. To state understanding of performing relaxation techniques and or identifying personal stressors.   Intervention Provide education on types of stress, identifiying stressors, and ways to cope with stress. Provide demonstration and active practice of relaxation techniques.      Personal Goals and Risk Factors Review:      Goals and Risk Factor Review  09/11/15 3888 09/24/15 1014 09/24/15 1020       Weight Management   Goals Progress/Improvement seen   Yes     Comments   Shannon Chung said before his surgery his weight was 236 lbs then after surgery he was 226lbs and now 220 lbs but he wants to lose 10 more lbs.      Increase Aerobic Exercise and Physical Activity   Goals Progress/Improvement seen  Yes Yes      Comments Shannon Chung has increased his Cardiac Rehab exercise workloads.  Shannon Chung reports that he has been 9 weeks out of surgery and he feels like he is getting stronger every week. Shannon Chung reports he is getting around better especially in a store.       Diabetes   Goal --  Stable blood sugars for Shannon Chung.  --  Stable blood sugars.       Hypertension   Goal --  Shannon Chung's blood pressure has been good.  --  Jenny Reichmann 's blood pressure has been very good in Cardiac Rehb.       Abnormal Lipids   Goal  --  Shannon Chung will get his lipid blood work rechecked by his MD in the future.       Stress   Goal  --  Shannon Chung stress has decreased since starting  Cardiac Rehab.          Personal Goals Discharge (Final Personal Goals and Risk Factors Review):      Goals and Risk Factor Review - 09/24/15 1020    Weight Management   Goals Progress/Improvement seen Yes   Comments Shannon Chung said before his surgery his weight was 236 lbs then after surgery he was 226lbs and now 220 lbs but he wants to lose 10 more lbs.       ITP Comments:     ITP Comments      10/08/15 1332           ITP Comments 30 day review preparation  Continue with ITP          Comments:

## 2015-10-08 NOTE — Progress Notes (Signed)
Daily Session Note  Patient Details  Name: Chriss Mannan MRN: [id number] Date of Birth: 16-Jun-1941 Referring Provider:  Minna Merritts, MD  Encounter Date: 10/08/2015  Check In:     Session Check In - 10/08/15 5361    Check-In   Staff Present Nyoka Cowden, RN;Renee Dillard Essex, MS, ACSM CEP, Exercise Physiologist;Debbrah Sampedro Joya Gaskins, RN, BSN   ER physicians immediately available to respond to emergencies See telemetry face sheet for immediately available ER MD   Medication changes reported     No   Fall or balance concerns reported    No   Warm-up and Cool-down Performed on first and last piece of equipment   VAD Patient? No   Pain Assessment   Currently in Pain? No/denies         Goals Met:  Independence with exercise equipment Exercise tolerated well No report of cardiac concerns or symptoms Strength training completed today  Goals Unmet:  Not Applicable  Goals Comments:  Patient completed exercise prescription and all exercise goals during rehab session. The exercise was tolerated well and the patient is progressing in the program.    Dr. Emily Filbert is Medical Director for [facility].

## 2015-10-09 DIAGNOSIS — C44519 Basal cell carcinoma of skin of other part of trunk: Secondary | ICD-10-CM | POA: Diagnosis not present

## 2015-10-09 DIAGNOSIS — C44219 Basal cell carcinoma of skin of left ear and external auricular canal: Secondary | ICD-10-CM | POA: Diagnosis not present

## 2015-10-09 DIAGNOSIS — L821 Other seborrheic keratosis: Secondary | ICD-10-CM | POA: Diagnosis not present

## 2015-10-09 DIAGNOSIS — D485 Neoplasm of uncertain behavior of skin: Secondary | ICD-10-CM | POA: Diagnosis not present

## 2015-10-09 NOTE — Addendum Note (Signed)
Addended by: Lynford Humphrey on: 10/09/2015 07:52 AM   Modules accepted: Orders

## 2015-10-10 DIAGNOSIS — M7541 Impingement syndrome of right shoulder: Secondary | ICD-10-CM | POA: Diagnosis not present

## 2015-10-10 DIAGNOSIS — M7542 Impingement syndrome of left shoulder: Secondary | ICD-10-CM | POA: Diagnosis not present

## 2015-10-10 DIAGNOSIS — M7520 Bicipital tendinitis, unspecified shoulder: Secondary | ICD-10-CM

## 2015-10-10 DIAGNOSIS — M7521 Bicipital tendinitis, right shoulder: Secondary | ICD-10-CM | POA: Diagnosis not present

## 2015-10-10 DIAGNOSIS — Z951 Presence of aortocoronary bypass graft: Secondary | ICD-10-CM | POA: Diagnosis not present

## 2015-10-10 HISTORY — DX: Bicipital tendinitis, unspecified shoulder: M75.20

## 2015-10-10 NOTE — Progress Notes (Signed)
Daily Session Note  Patient Details  Name: Shannon Chung MRN: [id number] Date of Birth: 13-Nov-1940 Referring Provider:  Minna Merritts, MD  Encounter Date: 10/10/2015  Check In:     Session Check In - 10/10/15 0858    Check-In   Staff Present Heath Lark, RN, BSN, CCRP;Carroll Enterkin, RN, BSN;Mumtaz Lovins, BS, ACSM EP-C, Exercise Physiologist   ER physicians immediately available to respond to emergencies See telemetry face sheet for immediately available ER MD   Medication changes reported     No   Fall or balance concerns reported    No   Warm-up and Cool-down Performed on first and last piece of equipment   VAD Patient? No   Pain Assessment   Currently in Pain? No/denies         Goals Met:  Proper associated with RPD/PD & O2 Sat Exercise tolerated well No report of cardiac concerns or symptoms Strength training completed today  Goals Unmet:  Not Applicable  Goals Comments:    Dr. Emily Filbert is Medical Director for [facility].

## 2015-10-14 ENCOUNTER — Other Ambulatory Visit: Payer: Self-pay | Admitting: Family Medicine

## 2015-10-14 ENCOUNTER — Other Ambulatory Visit (INDEPENDENT_AMBULATORY_CARE_PROVIDER_SITE_OTHER): Payer: Medicare Other

## 2015-10-14 DIAGNOSIS — Z125 Encounter for screening for malignant neoplasm of prostate: Secondary | ICD-10-CM

## 2015-10-14 DIAGNOSIS — I10 Essential (primary) hypertension: Secondary | ICD-10-CM

## 2015-10-14 DIAGNOSIS — E559 Vitamin D deficiency, unspecified: Secondary | ICD-10-CM

## 2015-10-14 DIAGNOSIS — E118 Type 2 diabetes mellitus with unspecified complications: Secondary | ICD-10-CM | POA: Diagnosis not present

## 2015-10-14 DIAGNOSIS — E785 Hyperlipidemia, unspecified: Secondary | ICD-10-CM

## 2015-10-14 LAB — LIPID PANEL
CHOLESTEROL: 167 mg/dL (ref 0–200)
HDL: 61 mg/dL (ref >39.00)
LDL CALC: 80 mg/dL (ref 0–99)
NonHDL: 105.55
TRIGLYCERIDES: 126 mg/dL (ref 0.0–149.0)
Total CHOL/HDL Ratio: 3
VLDL: 25.2 mg/dL (ref 0.0–40.0)

## 2015-10-14 LAB — BASIC METABOLIC PANEL
BUN: 17 mg/dL (ref 6–23)
CHLORIDE: 102 meq/L (ref 96–112)
CO2: 29 mEq/L (ref 19–32)
Calcium: 9.5 mg/dL (ref 8.4–10.5)
Creatinine, Ser: 0.96 mg/dL (ref 0.40–1.50)
GFR: 81.79 mL/min (ref >60.00)
Glucose, Bld: 109 mg/dL — ABNORMAL HIGH (ref 70–99)
POTASSIUM: 4.6 meq/L (ref 3.5–5.1)
Sodium: 140 mEq/L (ref 135–145)

## 2015-10-14 LAB — HEMOGLOBIN A1C: HEMOGLOBIN A1C: 6.3 % (ref 4.6–6.5)

## 2015-10-14 LAB — VITAMIN D 25 HYDROXY (VIT D DEFICIENCY, FRACTURES): VITD: 17.57 ng/mL — AB (ref 30.00–100.00)

## 2015-10-14 LAB — PSA, MEDICARE: PSA: 1.6 ng/mL (ref 0.10–4.00)

## 2015-10-15 ENCOUNTER — Encounter: Payer: Medicare Other | Admitting: *Deleted

## 2015-10-15 DIAGNOSIS — Z951 Presence of aortocoronary bypass graft: Secondary | ICD-10-CM | POA: Diagnosis not present

## 2015-10-15 NOTE — Progress Notes (Signed)
Daily Session Note  Patient Details  Name: Shannon Chung MRN: [id number] Date of Birth: October 08, 1941 Referring Provider:  Ria Bush, MD  Encounter Date: 10/15/2015  Check In:     Session Check In - 10/15/15 0902    Check-In   Staff Present Candiss Norse, MS, ACSM CEP, Exercise Physiologist;Kendall Otis Peak, Exercise Physiologist;Mary Kellie Shropshire, RN   ER physicians immediately available to respond to emergencies See telemetry face sheet for immediately available ER MD   Medication changes reported     No   Fall or balance concerns reported    No   Warm-up and Cool-down Performed on first and last piece of equipment   VAD Patient? No   Pain Assessment   Currently in Pain? No/denies   Multiple Pain Sites No           Exercise Prescription Changes - 10/15/15 0900    Exercise Review   Progression Yes   Response to Exercise   Symptoms No   Comments Reviewed individualized exercise prescription and made increases per departmental policy. Exercise increases were discussed with the patient and they were able to perform the new work loads without issue (no signs or symptoms).    Duration Progress to 50 minutes of aerobic without signs/symptoms of physical distress   Intensity Rest + 30   Progression Continue progressive overload as per policy without signs/symptoms or physical distress.   Resistance Training   Training Prescription Yes   Weight 3   Reps 10-15   Interval Training   Interval Training Yes   Equipment REL-XR   Recumbant Elliptical   Level 5  =Bio   Watts 40   Minutes 15   REL-XR   Level 12   Watts 120   Minutes 35      Goals Met:  Independence with exercise equipment Exercise tolerated well Personal goals reviewed No report of cardiac concerns or symptoms Strength training completed today  Goals Unmet:  Not Applicable  Goals Comments: Patient completed exercise prescription and all exercise goals during rehab session. The exercise  was tolerated well and the patient is progressing in the program.    Dr. Emily Filbert is Medical Director for [facility] and [facility].

## 2015-10-17 DIAGNOSIS — Z951 Presence of aortocoronary bypass graft: Secondary | ICD-10-CM

## 2015-10-17 NOTE — Progress Notes (Signed)
Daily Session Note  Patient Details  Name: Shannon Chung MRN: [id number] Date of Birth: [birth year] Referring Provider:  Ria Bush, MD  Encounter Date: 10/17/2015  Check In:     Session Check In - 10/17/15 0854    Check-In   Staff Present Gerlene Burdock, RN, BSN;Leilany Digeronimo, BS, ACSM EP-C, Exercise Physiologist;Kendall Otis Peak, Exercise Physiologist   ER physicians immediately available to respond to emergencies See telemetry face sheet for immediately available ER MD   Medication changes reported     No   Fall or balance concerns reported    No   Warm-up and Cool-down Performed on first and last piece of equipment   VAD Patient? No   Pain Assessment   Currently in Pain? No/denies         Goals Met:  Proper associated with RPD/PD & O2 Sat Exercise tolerated well No report of cardiac concerns or symptoms Strength training completed today  Goals Unmet:  Not Applicable  Goals Comments:    Dr. Emily Filbert is Medical Director for [facility].

## 2015-10-18 DIAGNOSIS — L905 Scar conditions and fibrosis of skin: Secondary | ICD-10-CM | POA: Diagnosis not present

## 2015-10-18 DIAGNOSIS — C44519 Basal cell carcinoma of skin of other part of trunk: Secondary | ICD-10-CM | POA: Diagnosis not present

## 2015-10-19 ENCOUNTER — Encounter: Payer: Self-pay | Admitting: Family Medicine

## 2015-10-21 ENCOUNTER — Ambulatory Visit (INDEPENDENT_AMBULATORY_CARE_PROVIDER_SITE_OTHER): Payer: Medicare Other | Admitting: Family Medicine

## 2015-10-21 ENCOUNTER — Encounter: Payer: Self-pay | Admitting: Family Medicine

## 2015-10-21 VITALS — BP 130/80 | HR 88 | Temp 97.9°F | Ht 72.5 in | Wt 218.2 lb

## 2015-10-21 DIAGNOSIS — I10 Essential (primary) hypertension: Secondary | ICD-10-CM

## 2015-10-21 DIAGNOSIS — R7303 Prediabetes: Secondary | ICD-10-CM

## 2015-10-21 DIAGNOSIS — M1710 Unilateral primary osteoarthritis, unspecified knee: Secondary | ICD-10-CM

## 2015-10-21 DIAGNOSIS — I513 Intracardiac thrombosis, not elsewhere classified: Secondary | ICD-10-CM

## 2015-10-21 DIAGNOSIS — M179 Osteoarthritis of knee, unspecified: Secondary | ICD-10-CM

## 2015-10-21 DIAGNOSIS — Z0000 Encounter for general adult medical examination without abnormal findings: Secondary | ICD-10-CM

## 2015-10-21 DIAGNOSIS — Z7189 Other specified counseling: Secondary | ICD-10-CM

## 2015-10-21 DIAGNOSIS — I2510 Atherosclerotic heart disease of native coronary artery without angina pectoris: Secondary | ICD-10-CM

## 2015-10-21 DIAGNOSIS — E559 Vitamin D deficiency, unspecified: Secondary | ICD-10-CM

## 2015-10-21 DIAGNOSIS — E785 Hyperlipidemia, unspecified: Secondary | ICD-10-CM

## 2015-10-21 HISTORY — DX: Encounter for general adult medical examination without abnormal findings: Z00.00

## 2015-10-21 MED ORDER — VITAMIN D3 25 MCG (1000 UT) PO CAPS: 1.0000 | ORAL_CAPSULE | Freq: Every day | ORAL | Status: DC

## 2015-10-21 MED ORDER — RANITIDINE HCL 150 MG PO TABS: 150.0000 mg | ORAL_TABLET | Freq: Every day | ORAL | Status: DC

## 2015-10-21 NOTE — Progress Notes (Signed)
Pre visit review using our clinic review tool, if applicable. No additional management support is needed unless otherwise documented below in the visit note. 

## 2015-10-21 NOTE — Assessment & Plan Note (Signed)
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 Appreciate cards and CT surg care of patient.

## 2015-10-21 NOTE — Assessment & Plan Note (Signed)

## 2015-10-21 NOTE — Assessment & Plan Note (Signed)
Resolved. Now off coumadin.

## 2015-10-21 NOTE — Patient Instructions (Addendum)
Recheck height today Sign release of records for colonoscopy from [facility] You are doing well today! Keep up the good work. Sugar was looking much better Return as needed or in 6 months for follow up visit.  Health Maintenance, Male A healthy lifestyle and preventative care can promote health and wellness.  Maintain regular health, dental, and eye exams.  Eat a healthy diet. Foods like vegetables, fruits, whole grains, low-fat dairy products, and lean protein foods contain the nutrients you need and are low in calories. Decrease your intake of foods high in solid fats, added sugars, and salt. Get information about a proper diet from your health care provider, if necessary.  Regular physical exercise is one of the most important things you can do for your health. Most adults should get at least 150 minutes of moderate-intensity exercise (any activity that increases your heart rate and causes you to sweat) each week. In addition, most adults need muscle-strengthening exercises on 2 or more days a week.   Maintain a healthy weight. The body mass index (BMI) is a screening tool to identify possible weight problems. It provides an estimate of body fat based on height and weight. Your health care provider can find your BMI and can help you achieve or maintain a healthy weight. For males 20 years and older:  A BMI below 18.5 is considered underweight.  A BMI of 18.5 to 24.9 is normal.  A BMI of 25 to 29.9 is considered overweight.  A BMI of 30 and above is considered obese.  Maintain normal blood lipids and cholesterol by exercising and minimizing your intake of saturated fat. Eat a balanced diet with plenty of fruits and vegetables. Blood tests for lipids and cholesterol should begin at age 50 and be repeated every 5 years. If your lipid or cholesterol levels are high, you are over age 63, or you are at high risk for heart disease, you may need your cholesterol levels checked more  frequently.Ongoing high lipid and cholesterol levels should be treated with medicines if diet and exercise are not working.  If you smoke, find out from your health care provider how to quit. If you do not use tobacco, do not start.  Lung cancer screening is recommended for adults aged 51-80 years who are at high risk for developing lung cancer because of a history of smoking. A yearly low-dose CT scan of the lungs is recommended for people who have at least a 30-pack-year history of smoking and are current smokers or have quit within the past 15 years. A pack year of smoking is smoking an average of 1 pack of cigarettes a day for 1 year (for example, a 30-pack-year history of smoking could mean smoking 1 pack a day for 30 years or 2 packs a day for 15 years). Yearly screening should continue until the smoker has stopped smoking for at least 15 years. Yearly screening should be stopped for people who develop a health problem that would prevent them from having lung cancer treatment.  If you choose to drink alcohol, do not have more than 2 drinks per day. One drink is considered to be 12 oz (360 mL) of beer, 5 oz (150 mL) of wine, or 1.5 oz (45 mL) of liquor.  Avoid the use of street drugs. Do not share needles with anyone. Ask for help if you need support or instructions about stopping the use of drugs.  High blood pressure causes heart disease and increases the risk of stroke.  High blood pressure is more likely to develop in:  People who have blood pressure in the end of the normal range (100-139/85-89 mm Hg).  People who are overweight or obese.  People who are African American.  If you are 70-35 years of age, have your blood pressure checked every 3-5 years. If you are 33 years of age or older, have your blood pressure checked every year. You should have your blood pressure measured twice--once when you are at a hospital or clinic, and once when you are not at a hospital or clinic. Record the  average of the two measurements. To check your blood pressure when you are not at a hospital or clinic, you can use:  An automated blood pressure machine at a pharmacy.  A home blood pressure monitor.  If you are 41-10 years old, ask your health care provider if you should take aspirin to prevent heart disease.  Diabetes screening involves taking a blood sample to check your fasting blood sugar level. This should be done once every 3 years after age 46 if you are at a normal weight and without risk factors for diabetes. Testing should be considered at a younger age or be carried out more frequently if you are overweight and have at least 1 risk factor for diabetes.  Colorectal cancer can be detected and often prevented. Most routine colorectal cancer screening begins at the age of 10 and continues through age 70. However, your health care provider may recommend screening at an earlier age if you have risk factors for colon cancer. On a yearly basis, your health care provider may provide home test kits to check for hidden blood in the stool. A small camera at the end of a tube may be used to directly examine the colon (sigmoidoscopy or colonoscopy) to detect the earliest forms of colorectal cancer. Talk to your health care provider about this at age 59 when routine screening begins. A direct exam of the colon should be repeated every 5-10 years through age 40, unless early forms of precancerous polyps or small growths are found.  People who are at an increased risk for hepatitis B should be screened for this virus. You are considered at high risk for hepatitis B if:  You were born in a country where hepatitis B occurs often. Talk with your health care provider about which countries are considered high risk.  Your parents were born in a high-risk country and you have not received a shot to protect against hepatitis B (hepatitis B vaccine).  You have HIV or AIDS.  You use needles to inject street  drugs.  You live with, or have sex with, someone who has hepatitis B.  You are a man who has sex with other men (MSM).  You get hemodialysis treatment.  You take certain medicines for conditions like cancer, organ transplantation, and autoimmune conditions.  Hepatitis C blood testing is recommended for all people born from [birth year] through 1965 and any individual with known risk factors for hepatitis C.  Healthy men should no longer receive prostate-specific antigen (PSA) blood tests as part of routine cancer screening. Talk to your health care provider about prostate cancer screening.  Testicular cancer screening is not recommended for adolescents or adult males who have no symptoms. Screening includes self-exam, a health care provider exam, and other screening tests. Consult with your health care provider about any symptoms you have or any concerns you have about testicular cancer.  Practice safe sex. Use condoms  and avoid high-risk sexual practices to reduce the spread of sexually transmitted infections (STIs).  You should be screened for STIs, including gonorrhea and chlamydia if:  You are sexually active and are younger than 24 years.  You are older than 24 years, and your health care provider tells you that you are at risk for this type of infection.  Your sexual activity has changed since you were last screened, and you are at an increased risk for chlamydia or gonorrhea. Ask your health care provider if you are at risk.  If you are at risk of being infected with HIV, it is recommended that you take a prescription medicine daily to prevent HIV infection. This is called pre-exposure prophylaxis (PrEP). You are considered at risk if:  You are a man who has sex with other men (MSM).  You are a heterosexual man who is sexually active with multiple partners.  You take drugs by injection.  You are sexually active with a partner who has HIV.  Talk with your health care provider about  whether you are at high risk of being infected with HIV. If you choose to begin PrEP, you should first be tested for HIV. You should then be tested every 3 months for as long as you are taking PrEP.  Use sunscreen. Apply sunscreen liberally and repeatedly throughout the day. You should seek shade when your shadow is shorter than you. Protect yourself by wearing long sleeves, pants, a wide-brimmed hat, and sunglasses year round whenever you are outdoors.  Tell your health care provider of new moles or changes in moles, especially if there is a change in shape or color. Also, tell your health care provider if a mole is larger than the size of a pencil eraser.  A one-time screening for abdominal aortic aneurysm (AAA) and surgical repair of large AAAs by ultrasound is recommended for men aged 11-75 years who are current or former smokers.  Stay current with your vaccines (immunizations).   This information is not intended to replace advice given to you by your health care provider. Make sure you discuss any questions you have with your health care provider.   Document Released: 04/16/2008 Document Revised: 11/09/2014 Document Reviewed: 03/16/2011 Elsevier Interactive Patient Education [employer].

## 2015-10-21 NOTE — Assessment & Plan Note (Signed)
Chronic, stable. Continue current regimen. 

## 2015-10-21 NOTE — Assessment & Plan Note (Signed)
Reviewed with patient. Continue simvastatin 40mg  nightly.

## 2015-10-21 NOTE — Assessment & Plan Note (Signed)
Advanced directive discussion - scanned and in chart 9/14. HCPOA is Georgi.

## 2015-10-21 NOTE — Assessment & Plan Note (Signed)
With weight loss and better diet has controlled and now prediabetic. Congratulated. Pt motivated to sustain changes.

## 2015-10-21 NOTE — Assessment & Plan Note (Signed)
rec start 1000 IU daily.  

## 2015-10-21 NOTE — Assessment & Plan Note (Signed)
Considering L knee replacement with ortho. Aware will need cardiac clearance.

## 2015-10-21 NOTE — Progress Notes (Signed)
BP 130/80 mmHg  Pulse 88  Temp(Src) 97.9 F (36.6 C) (Oral)  Ht 6' 0.5" (1.842 m)  Wt 218 lb 4 oz (98.998 kg)  BMI 29.18 kg/m2   CC: medicare wellness visit  Subjective:    Patient ID: Shannon Chung, male    DOB: 06/03/1941, 74 y.o.   MRN: [id number]  HPI: Shannon Chung is a 74 y.o. 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 Continues cardiac rehab.  DM - new dx 08/2015. Controlled with weight loss and diet control. Now only prediabetic. Lab Results  Component Value Date   HGBA1C 6.3 10/14/2015    Hearing screen - passed Vision screen - at eye center Fall risk screen - passed Depression screen - passed  Preventative: Colonoscopy WNL 2007 done in [place]. Records requested today Prostate cancer screening - discussed. Mild nocturia. Will screen  Lung cancer screening - not eligible  Flu shot - yearly  Tdap 2016  Pneumovax 2013, prevnar 12/2013  Shingles shot - 2007  Advanced directive discussion - scanned and in chart 07/17/2015. HCPOA is Georgi.  Seat belt use discussed Sunscreen use and skin screen discussed  Lives with fiancee for 20yrs Metta Clines) divorced Occupation Retired Nurse, mental health Edu: 1 yr college Activity: volunteers at [employer]: good water, fruits/vegetables daily  Relevant past medical, surgical, family and social history reviewed and updated as indicated. Interim medical history since our last visit reviewed. Allergies and medications reviewed and updated. Current Outpatient Prescriptions on File Prior to Visit  Medication Sig  . acetaminophen (TYLENOL) 500 MG tablet Take 500 mg by mouth every 6 (six) hours as needed for mild pain.   Marland Kitchen aspirin (ASPIRIN EC) 81 MG EC tablet Take 81 mg by mouth daily. Swallow whole.  . fluticasone (FLONASE) 50 MCG/ACT nasal spray Place 1 spray into both nostrils as needed for allergies.   Marland Kitchen lansoprazole (PREVACID) 30 MG capsule Take 1 capsule (30 mg total) by mouth  every morning.  Marland Kitchen lisinopril (PRINIVIL,ZESTRIL) 2.5 MG tablet Take 1 tablet (2.5 mg total) by mouth daily.  . meloxicam (MOBIC) 15 MG tablet Take 7.5 mg by mouth once a week. Saturday or sunday  . metoprolol succinate (TOPROL-XL) 25 MG 24 hr tablet Take 1 tablet (25 mg total) by mouth daily.  . simvastatin (ZOCOR) 40 MG tablet Take 1 tablet (40 mg total) by mouth daily at 6 PM.   No current facility-administered medications on file prior to visit.    Review of Systems Per HPI unless specifically indicated in ROS section     Objective:    BP 130/80 mmHg  Pulse 88  Temp(Src) 97.9 F (36.6 C) (Oral)  Ht 6' 0.5" (1.842 m)  Wt 218 lb 4 oz (98.998 kg)  BMI 29.18 kg/m2  Wt Readings from Last 3 Encounters:  10/21/15 218 lb 4 oz (98.998 kg)  09/11/15 222 lb (100.699 kg)  09/11/15 222 lb 12 oz (101.039 kg)    Physical Exam  Constitutional: He is oriented to person, place, and time. He appears well-developed and well-nourished. No distress.  HENT:  Head: Normocephalic and atraumatic.  Right Ear: Hearing, tympanic membrane, external ear and ear canal normal.  Left Ear: Hearing, tympanic membrane, external ear and ear canal normal.  Nose: Nose normal.  Mouth/Throat: Uvula is midline, oropharynx is clear and moist and mucous membranes are normal. No oropharyngeal exudate, posterior oropharyngeal edema or posterior oropharyngeal erythema.  Eyes: Conjunctivae and EOM are normal. Pupils  are equal, round, and reactive to light. No scleral icterus.  Neck: Normal range of motion. Neck supple. Carotid bruit is not present. No thyromegaly present.  Cardiovascular: Normal rate, regular rhythm, normal heart sounds and intact distal pulses.   No murmur heard. Pulses:      Radial pulses are 2+ on the right side, and 2+ on the left side.  Pulmonary/Chest: Effort normal and breath sounds normal. No respiratory distress. He has no wheezes. He has no rales.  Abdominal: Soft. Bowel sounds are normal. He  exhibits no distension and no mass. There is no tenderness. There is no rebound and no guarding.  Genitourinary: Rectum normal and prostate normal. Rectal exam shows no external hemorrhoid, no internal hemorrhoid, no fissure, no mass, no tenderness and anal tone normal. Prostate is not enlarged (20gm) and not tender.  Musculoskeletal: Normal range of motion. He exhibits no edema.  Lymphadenopathy:    He has no cervical adenopathy.  Neurological: He is alert and oriented to person, place, and time.  CN grossly intact, station and gait intact Recall 3/3 Calculation 4/5 serial 7s  Skin: Skin is warm and dry. No rash noted.  Psychiatric: He has a normal mood and affect. His behavior is normal. Judgment and thought content normal.  Nursing note and vitals reviewed.  Results for orders placed or performed in visit on 10/21/15  HM DIABETES EYE EXAM  Result Value Ref Range   HM Diabetic Eye Exam No Retinopathy No Retinopathy      Assessment & Plan:   Problem List Items Addressed This Visit    Vitamin D deficiency    rec start 1000 IU daily.      Prediabetes    With weight loss and better diet has controlled and now prediabetic. Congratulated. Pt motivated to sustain changes.      Osteoarthritis    Considering L knee replacement with ortho. Aware will need cardiac clearance.      Medicare annual wellness visit, subsequent - Primary    I have personally reviewed the Medicare Annual Wellness questionnaire and have noted 1. The patient's medical and social history 2. Their use of alcohol, tobacco or illicit drugs 3. Their current medications and supplements 4. The patient's functional ability including ADL's, fall risks, home safety risks and hearing or visual impairment. Cognitive function has been assessed and addressed as indicated.  5. Diet and physical activity 6. Evidence for depression or mood disorders The patients weight, height, BMI have been recorded in the chart. I have  made referrals, counseling and provided education to the patient based on review of the above and I have provided the pt with a written personalized care plan for preventive services. Provider list updated.. See scanned questionairre as needed for further documentation. Reviewed preventative protocols and updated unless pt declined.       Left ventricular apical thrombus (HCC)    Resolved. Now off coumadin.      Hyperlipidemia    Reviewed with patient. Continue simvastatin 40mg  nightly.       Essential hypertension    Chronic, stable. Continue current regimen.      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 Appreciate cards and CT surg care of patient.      Advanced care planning/counseling discussion    Advanced directive discussion - scanned and in chart 9/14. HCPOA is Georgi.           Follow up plan: Return in about  6 months (around 04/20/2016), or as needed, for follow up visit.

## 2015-10-22 DIAGNOSIS — Z951 Presence of aortocoronary bypass graft: Secondary | ICD-10-CM

## 2015-10-22 NOTE — Progress Notes (Signed)
Daily Session Note  Patient Details  Name: Shannon Chung MRN: [id number] Date of Birth: 28-Oct-1941 Referring Provider:  Minna Merritts, MD  Encounter Date: 10/22/2015  Check In:     Session Check In - 10/22/15 0906    Check-In   Staff Present Candiss Norse, MS, ACSM CEP, Exercise Physiologist;Diane Joya Gaskins, RN, Apolonio Schneiders, BS, Exercise Physiologist   ER physicians immediately available to respond to emergencies See telemetry face sheet for immediately available ER MD   Medication changes reported     No   Fall or balance concerns reported    No   Warm-up and Cool-down Performed on first and last piece of equipment   VAD Patient? No   Pain Assessment   Currently in Pain? No/denies         Goals Met:  Independence with exercise equipment Exercise tolerated well No report of cardiac concerns or symptoms Strength training completed today  Goals Unmet:  Not Applicable  Goals Comments:    Dr. Emily Filbert is Medical Director for [facility].

## 2015-10-24 DIAGNOSIS — Z951 Presence of aortocoronary bypass graft: Secondary | ICD-10-CM

## 2015-10-24 NOTE — Progress Notes (Signed)
Daily Session Note  Patient Details  Name: Shannon Chung MRN: [id number] Date of Birth: 10/26/1941 Referring Provider:  Minna Merritts, MD  Encounter Date: 10/24/2015  Check In:     Session Check In - 10/24/15 0957    Check-In   Staff Present Hessie Knows, BS, Exercise Physiologist;Kelly Amedeo Plenty, BS, ACSM CEP, Exercise Physiologist;Carroll Enterkin, RN, BSN   ER physicians immediately available to respond to emergencies See telemetry face sheet for immediately available ER MD   Medication changes reported     No   Fall or balance concerns reported    No   Warm-up and Cool-down Performed on first and last piece of equipment   VAD Patient? No   Pain Assessment   Currently in Pain? No/denies         Goals Met:  Independence with exercise equipment Personal goals reviewed No report of cardiac concerns or symptoms Strength training completed today  Goals Unmet:  Not Applicable  Goals Comments:    Dr. Emily Filbert is Medical Director for [facility] and [facility].

## 2015-10-24 NOTE — Progress Notes (Signed)
Cardiac Individual Treatment Plan  Patient Details  Name: Shannon Chung MRN: [id number] Date of Birth: 1941/05/23 Referring Provider:  Minna Merritts, MD  Initial Encounter Date:    Visit Diagnosis: S/P CABG x 5  Patient's Home Medications on Admission:  Current outpatient prescriptions:  .  acetaminophen (TYLENOL) 500 MG tablet, Take 500 mg by mouth every 6 (six) hours as needed for mild pain. , Disp: , Rfl:  .  aspirin (ASPIRIN EC) 81 MG EC tablet, Take 81 mg by mouth daily. Swallow whole., Disp: , Rfl:  .  Cholecalciferol (VITAMIN D3) 1000 UNITS CAPS, Take 1 capsule (1,000 Units total) by mouth daily., Disp: 30 capsule, Rfl:  .  fluticasone (FLONASE) 50 MCG/ACT nasal spray, Place 1 spray into both nostrils as needed for allergies. , Disp: , Rfl:  .  lansoprazole (PREVACID) 30 MG capsule, Take 1 capsule (30 mg total) by mouth every morning., Disp: 90 capsule, Rfl: 3 .  lisinopril (PRINIVIL,ZESTRIL) 2.5 MG tablet, Take 1 tablet (2.5 mg total) by mouth daily., Disp: 90 tablet, Rfl: 3 .  meloxicam (MOBIC) 15 MG tablet, Take 7.5 mg by mouth once a week. Saturday or sunday, Disp: , Rfl: 1 .  metoprolol succinate (TOPROL-XL) 25 MG 24 hr tablet, Take 1 tablet (25 mg total) by mouth daily., Disp: 90 tablet, Rfl: 3 .  ranitidine (ZANTAC) 150 MG tablet, Take 1 tablet (150 mg total) by mouth at bedtime., Disp: 180 tablet, Rfl: 1 .  simvastatin (ZOCOR) 40 MG tablet, Take 1 tablet (40 mg total) by mouth daily at 6 PM., Disp: 90 tablet, Rfl: 3  Past Medical History: Past Medical History  Diagnosis Date  . Skin cancer     squamous and basal, sees derm regularly  . 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 Mural thrombus of cardiac apex ([place])     a. 06/2014: LV; resolved with coumadin-->no residual on f/u echo, no longer on coumadin.  . Ischemic cardiomyopathy     a. dilated, EF 35% improved to 45-50% (2015);  b. 07/2015 EF 25-35% by LV gram.  . History of radiation exposure      right vocal cord squamous cell cancer  . Essential hypertension   . Dyslipidemia   . [place] atrial fibrillation ([place]) 1983    a. isolated episode, not on [place].  Marland Kitchen Vitamin D deficiency   . Coronary artery disease     a. 06/2015 Cardiac CT: Ca score 1103 (84th %'ile);  b. 07/2015 Cath: LM 70, LAD 80p, 100/7m D1 70, D2 95, RI 75, RCA 100p/m;  c. 07/2015 CABG x 5 (LIMA->LAD, VG->Diag, VG->OM1->OM2, VG->OM3).  . Osteoarthritis     a. R-shoulder, L-knee ([place])  . Diabetes mellitus without complication [place]/0092 . Impingement syndrome of right shoulder 10/2015    s/p steroid injection Dr [place]abra Heck   Tobacco Use: History  Smoking status  . Former Smoker -- 0.50 packs/day for 10 years  . Types: Cigarettes  . Quit date: 11/02/1978  Smokeless tobacco  . Never Used    Labs: Recent Review Flowsheet Data    Labs for ITP Cardiac and Pulmonary Rehab Latest Ref Rng 07/29/2015 07/29/2015 07/29/2015 07/29/2015 10/14/2015   Cholestrol 0 - 200 mg/dL - - - - 167   LDLCALC 0 - 99 mg/dL - - - - 80   HDL >39.00 mg/dL - - - - 61.00   Trlycerides 0.0 - 149.0 mg/dL - - - - 126.0  Hemoglobin A1c 4.6 - 6.5 % - - - - 6.3   PHART 7.350 - 7.450 7.379 7.345(L) - 7.333(L) -   PCO2ART 35.0 - 45.0 mmHg 36.4 45.5(H) - 45.2(H) -   HCO3 20.0 - 24.0 mEq/L 21.7 24.8(H) - 24.0 -   TCO2 0 - 100 mmol/L 23 26 23 25  -   ACIDBASEDEF 0.0 - 2.0 mmol/L 3.0(H) 1.0 - 2.0 -   O2SAT - 91.0 95.0 - 96.0 -       Exercise Target Goals:    Exercise Program Goal: Individual exercise prescription set with THRR, safety & activity barriers. Participant demonstrates ability to understand and report RPE using BORG scale, to self-measure pulse accurately, and to acknowledge the importance of the exercise prescription.  Exercise Prescription Goal: Starting with aerobic activity 30 plus minutes a day, 3 days per week for initial exercise prescription. Provide home exercise prescription and guidelines that participant  acknowledges understanding prior to discharge.  Activity Barriers & Risk Stratification:     Activity Barriers & Risk Stratification - 08/28/15 1858    Activity Barriers & Risk Stratification   Risk Stratification High      6 Minute Walk:     6 Minute Walk      08/28/15 1420       6 Minute Walk   Phase Initial     Distance 1330 feet     Walk Time 6 minutes     Resting HR 65 bpm     Resting BP 132/78 mmHg     Max Ex. HR 107 bpm     Max Ex. BP 144/60 mmHg     RPE 13     Symptoms Yes (comment)     Comments L knee pain due to arthritis 5/10 pain        Initial Exercise Prescription:     Initial Exercise Prescription - 08/28/15 1400    Date of Initial Exercise Prescription   Date 08/28/15   Treadmill   MPH 2.2   Grade 0   Minutes 10   Bike   Level 0.4   Minutes 15   Recumbant Bike   Level 3   RPM 40   Watts 30   Minutes 15   NuStep   Level 3   Watts 30   Minutes 15   Arm Ergometer   Level 1   Watts 8   Minutes 10   Arm/Foot Ergometer   Level 4   Watts 12   Minutes 10   Cybex   Level 3   RPM 50   Minutes 10   Recumbant Elliptical   Level 1   RPM 40   Watts 10   Minutes 10   Elliptical   Level 1   Speed 3   Minutes 1   REL-XR   Level 3   Watts 40   Minutes 15   Prescription Details   Frequency (times per week) 3   Duration Progress to 30 minutes of continuous aerobic without signs/symptoms of physical distress   Intensity   THRR REST +  30   Ratings of Perceived Exertion 11-15   Progression Continue progressive overload as per policy without signs/symptoms or physical distress.   Resistance Training   Training Prescription Yes   Weight 2   Reps 10-15      Exercise Prescription Changes:     Exercise Prescription Changes      09/10/15 1100 09/17/15 0900 09/17/15 1622 10/01/15 1000 10/15/15 0900  Exercise Review   Progression No Yes No Yes Yes   Response to Exercise   Blood Pressure (Admit) 110/68 mmHg  122/76 mmHg     Blood  Pressure (Exercise) 130/72 mmHg  140/78 mmHg     Blood Pressure (Exit) 106/68 mmHg  120/70 mmHg     Heart Rate (Admit) 71 bpm  83 bpm     Heart Rate (Exercise) 80 bpm  85 bpm     Heart Rate (Exit) 67 bpm  62 bpm     Rating of Perceived Exertion (Exercise) 14  15     Symptoms No No no No No   Comments Patient tolerated exercise well on his first day of class without signs or symptoms. I spoke with patient about exercise prescription and he was able to complete his increases in class today with no signs or symptoms.   Discussed HIIT with Jenny Reichmann, he is going to start with it on Thursday and is excited for another challenge. Reviewed individualized exercise prescription and made increases per departmental policy. Exercise increases were discussed with the patient and they were able to perform the new work loads without issue (no signs or symptoms).  Reviewed individualized exercise prescription and made increases per departmental policy. Exercise increases were discussed with the patient and they were able to perform the new work loads without issue (no signs or symptoms).    Duration Progress to 30 minutes of continuous aerobic without signs/symptoms of physical distress Progress to 30 minutes of continuous aerobic without signs/symptoms of physical distress Progress to 30 minutes of continuous aerobic without signs/symptoms of physical distress Progress to 50 minutes of aerobic without signs/symptoms of physical distress Progress to 50 minutes of aerobic without signs/symptoms of physical distress   Intensity Rest + 30 Rest + 30 Rest + 30 Rest + 30 Rest + 30   Progression Continue progressive overload as per policy without signs/symptoms or physical distress. Continue progressive overload as per policy without signs/symptoms or physical distress. Continue progressive overload as per policy without signs/symptoms or physical distress. Continue progressive overload as per policy without signs/symptoms or  physical distress. Continue progressive overload as per policy without signs/symptoms or physical distress.   Resistance Training   Training Prescription Yes Yes Yes Yes Yes   Weight 3 3 3 3 3    Reps 10-15 10-15 10-15 10-15 10-15   Interval Training   Interval Training No No No Yes Yes   Equipment    REL-XR REL-XR   Recumbant Elliptical   Level 5  =Bio 5  =Bio 5 5  =Bio 5  =Bio   Watts 40 40 40 40 40   Minutes 15 15 15 15 15    REL-XR   Level 4 6 6 11 12    Watts 58 80 80 85 120   Minutes 15 15 15  35 35      Discharge Exercise Prescription (Final Exercise Prescription Changes):     Exercise Prescription Changes - 10/15/15 0900    Exercise Review   Progression Yes   Response to Exercise   Symptoms No   Comments Reviewed individualized exercise prescription and made increases per departmental policy. Exercise increases were discussed with the patient and they were able to perform the new work loads without issue (no signs or symptoms).    Duration Progress to 50 minutes of aerobic without signs/symptoms of physical distress   Intensity Rest + 30   Progression Continue progressive overload as per policy without signs/symptoms or physical distress.  Resistance Training   Training Prescription Yes   Weight 3   Reps 10-15   Interval Training   Interval Training Yes   Equipment REL-XR   Recumbant Elliptical   Level 5  =Bio   Watts 40   Minutes 15   REL-XR   Level 12   Watts 120   Minutes 35      Nutrition:  Target Goals: Understanding of nutrition guidelines, daily intake of sodium '1500mg'$ , cholesterol '200mg'$ , calories 30% from fat and 7% or less from saturated fats, daily to have 5 or more servings of fruits and vegetables.  Biometrics:     Pre Biometrics - 08/28/15 1413    Pre Biometrics   Height '6\' 2"'$  (1.88 m)   Weight 222 lb 8 oz (100.925 kg)   Waist Circumference 40.25 inches   Hip Circumference 41.5 inches   Waist to Hip Ratio 0.97 %   BMI (Calculated)  28.6       Nutrition Therapy Plan and Nutrition Goals:     Nutrition Therapy & Goals - 10/01/15 1301    Nutrition Therapy   Diet Instructed on a heart healthy meal plan based on 1900 calories, DASH diet principles as well as diet principles for pre-diabetes   Drug/Food Interactions Statins/Certain Fruits   Fiber 30 grams   Whole Grain Foods 3 servings   Protein 8 ounces/day   Saturated Fats 13 max. grams   Fruits and Vegetables 5 servings/day   Personal Nutrition Goals   Personal Goal #1 To try some of the low sodium products suggested such as StarKist very low sodium tuna or Fiesta Lime Ms.DASH, or [place] low sodium spaghetti sauce   Personal Goal #2 Read labels for saturated fat, trans fat and sodium.   Personal Goal #3 Increase vegetable/fruit intake to minimum of 5 servings per day.   Personal Goal #4 Balance meals with protein, 2-4 servings of carbohydrate and non-starchy vegetables.      Nutrition Discharge: Rate Your Plate Scores:     Rate Your Plate - 85/63/14 9702    Rate Your Plate Scores   Pre Score 77   Pre Score % 92 %      Nutrition Goals Re-Evaluation:     Nutrition Goals Re-Evaluation      09/24/15 1002 10/24/15 1032         Personal Goal #1 Re-Evaluation   Personal Goal #1 Lower salt       Goal Progress Seen Yes Yes      Comments Jeffory reports that he and his wife are label readers now after his surgery and they have cut out all salt. Raife reports they eat alot of fish and practically no red meat. I made him a Cardiac Rehab registered dietician appt.  Tarrance reports his sodium intake is much lower.       Personal Goal #3 Re-Evaluation   Goal Progress Seen  Yes      Personal Goal #4 Re-Evaluation   Goal Progress Seen  Yes         Psychosocial: Target Goals: Acknowledge presence or absence of depression, maximize coping skills, provide positive support system. Participant is able to verbalize types and ability to use techniques and skills needed  for reducing stress and depression.  Initial Review & Psychosocial Screening:     Initial Psych Review & Screening - 08/28/15 [place]? Yes   Comments Patient has fiancee, Rachel Moulds; two daughters and 5  grandchildren.  Patient is affliated with [facility].     Barriers   Psychosocial barriers to participate in program There are no identifiable barriers or psychosocial needs.  Patient stated one year ago is was really stressed when his daughter had cancer and he was helping take care of his grandchildren.  He was worried about his daughter.  She is cancer free at this time.     Screening Interventions   Interventions Encouraged to exercise      Quality of Life Scores:   PHQ-9:     Recent Review Flowsheet Data    Depression screen [facility] 2/9 10/21/2015 08/28/2015   Decreased Interest 0 0   Down, Depressed, Hopeless 0 0   PHQ - 2 Score 0 0   Altered sleeping - 1   Tired, decreased energy - 1   Change in appetite - 0   Feeling bad or failure about yourself  - 0   Trouble concentrating - 0   Moving slowly or fidgety/restless - 0   Suicidal thoughts - 0   PHQ-9 Score - 2   Difficult doing work/chores - Not difficult at all      Psychosocial Evaluation and Intervention:     Psychosocial Evaluation - 10/01/15 0935    Psychosocial Evaluation & Interventions   Interventions Stress management education;Relaxation education;Encouraged to exercise with the program and follow exercise prescription   Comments Counselor met with Mr. Chervenak today (Mr. Chauncey Cruel) for the initial psychosocial evaluation.  He is a 74 year old who reports having open heart surgery 9 weeks ago.  He states he has a strong support system with a 23 year significant other and several adult daughters out of state.  Mr. Chauncey Cruel is awaiting knee replacement surgery once he recovers enough.  He also has arthritis and borderline diabetes.  He reports he sleeps well and has a good  appetite.  He denies a history of depression or anxiety but recognizes that his younger daughter has been battling cancer for 5 years and this has been especially stressful for him.  Mr. Chauncey Cruel states his mood is "okay" currently having recently moved to [facility] and this has been stressful as well.  His Thanksgiving weekend with couples from  [place] was also "awkward" with everyone concerned about Mr. Cherlynn Polo health and this bothered him.  His goals are to lose weight and increase his stamina and strength in this program.  He states he is already experiencing some of the benefits in feeling stronger since beginning.  Counselor recommended Mr. Chauncey Cruel meet with the dietician to address his weight loss goals and he will also benefit from all of the psychoeducational components of this program, especially stress management.        Psychosocial Re-Evaluation:     Psychosocial Re-Evaluation      09/24/15 1017 10/24/15 1034         Psychosocial Re-Evaluation   Interventions Encouraged to attend Cardiac Rehabilitation for the exercise Encouraged to attend Cardiac Rehabilitation for the exercise      Comments Terren says Cardiac REhab has increased his strength every week.           Vocational Rehabilitation: Provide vocational rehab assistance to qualifying candidates.   Vocational Rehab Evaluation & Intervention:     Vocational Rehab - 08/28/15 1900    Initial Vocational Rehab Evaluation & Intervention   Assessment shows need for Vocational Rehabilitation No      Education: Education Goals: Education classes will be provided on a weekly  basis, covering required topics. Participant will state understanding/return demonstration of topics presented.  Learning Barriers/Preferences:     Learning Barriers/Preferences - 08/28/15 1858    Learning Barriers/Preferences   Learning Barriers Sight;Exercise Concerns   Learning Preferences Written [place]      Education Topics: General Nutrition  Guidelines/Fats and Fiber: -Group instruction provided by verbal, written material, models and posters to present the general guidelines for heart healthy nutrition. Gives an explanation and review of dietary fats and fiber.          Cardiac Rehab from 10/22/2015 in [facility] Cardiac Rehab   Date  10/08/15   Educator  Erlene Quan   Instruction Review Code  2- meets goals/outcomes      Controlling Sodium/Reading Food Labels: -Group verbal and written material supporting the discussion of sodium use in heart healthy nutrition. Review and explanation with models, verbal and written materials for utilization of the food label.      Cardiac Rehab from 10/22/2015 in [facility] Cardiac Rehab   Date  10/15/15   Educator  CR   Instruction Review Code  2- meets goals/outcomes      Exercise Physiology & Risk Factors: - Group verbal and written instruction with models to review the exercise physiology of the cardiovascular system and associated critical values. Details cardiovascular disease risk factors and the goals associated with each risk factor.   Aerobic Exercise & Resistance Training: - Gives group verbal and written discussion on the health impact of inactivity. On the components of aerobic and resistive training programs and the benefits of this training and how to safely progress through these programs.      Cardiac Rehab from 10/22/2015 in [facility] Cardiac Rehab   Date  09/10/15   Educator  RM   Instruction Review Code  2- meets goals/outcomes      Flexibility, Balance, General Exercise Guidelines: - Provides group verbal and written instruction on the benefits of flexibility and balance training programs. Provides general exercise guidelines with specific guidelines to those with heart or lung disease. Demonstration and skill practice provided.      Cardiac Rehab from 10/22/2015 in [facility] Cardiac Rehab   Date  09/17/15   Educator  RM   Instruction Review Code  2- meets goals/outcomes       Stress Management: - Provides group verbal and written instruction about the health risks of elevated stress, cause of high stress, and healthy ways to reduce stress.   Depression: - Provides group verbal and written instruction on the correlation between heart/lung disease and depressed mood, treatment options, and the stigmas associated with seeking treatment.      Cardiac Rehab from 10/22/2015 in [facility] Cardiac Rehab   Date  10/17/15   Educator  CE   Instruction Review Code  2- meets goals/outcomes      Anatomy & Physiology of the Heart: - Group verbal and written instruction and models provide basic cardiac anatomy and physiology, with the coronary electrical and arterial systems. Review of: AMI, Angina, Valve disease, Heart Failure, Cardiac Arrhythmia, Pacemakers, and the ICD.      Cardiac Rehab from 10/22/2015 in [facility] Cardiac Rehab   Date  09/24/15   Educator  [place]   Instruction Review Code  2- meets goals/outcomes      Cardiac Procedures: - Group verbal and written instruction and models to describe the testing methods done to diagnose heart disease. Reviews the outcomes of the test results. Describes the treatment choices: Medical Management, Angioplasty, or Coronary Bypass Surgery.  Cardiac Rehab from 10/22/2015 in [facility] Cardiac Rehab   Date  10/22/15   Educator  DW   Instruction Review Code  2- meets goals/outcomes      Cardiac Medications: - Group verbal and written instruction to review commonly prescribed medications for heart disease. Reviews the medication, class of the drug, and side effects. Includes the steps to properly store meds and maintain the prescription regimen.      Cardiac Rehab from 10/22/2015 in [facility] Cardiac Rehab   Date  09/12/15   Educator  CE   Instruction Review Code  2- meets goals/outcomes      Go Sex-Intimacy & Heart Disease, Get SMART - Goal Setting: - Group verbal and written instruction through game format to discuss heart disease  and the return to sexual intimacy. Provides group verbal and written material to discuss and apply goal setting through the application of the S.M.A.R.T. Method.      Cardiac Rehab from 10/22/2015 in [facility] Cardiac Rehab   Date  10/22/15   Educator  DW   Instruction Review Code  2- meets goals/outcomes      Other Matters of the Heart: - Provides group verbal, written materials and models to describe Heart Failure, Angina, Valve Disease, and Diabetes in the realm of heart disease. Includes description of the disease process and treatment options available to the cardiac patient.      Cardiac Rehab from 10/22/2015 in [facility] Cardiac Rehab   Date  10/03/15   Educator  CE   Instruction Review Code  2- meets goals/outcomes      Exercise & Equipment Safety: - Individual verbal instruction and demonstration of equipment use and safety with use of the equipment.      Cardiac Rehab from 10/22/2015 in [facility] Cardiac Rehab   Date  08/28/15   Educator  DW   Instruction Review Code  1- partially meets, needs review/practice      Infection Prevention: - Provides verbal and written material to individual with discussion of infection control including proper hand washing and proper equipment cleaning during exercise session.      Cardiac Rehab from 10/22/2015 in [facility] Cardiac Rehab   Date  08/28/15   Educator  DW   Instruction Review Code  2- meets goals/outcomes      Falls Prevention: - Provides verbal and written material to individual with discussion of falls prevention and safety.      Cardiac Rehab from 10/22/2015 in [facility] Cardiac Rehab   Date  08/28/15   Educator  DW   Instruction Review Code  2- meets goals/outcomes      Diabetes: - Individual verbal and written instruction to review signs/symptoms of diabetes, desired ranges of glucose level fasting, after meals and with exercise. Advice that pre and post exercise glucose checks will be done for 3 sessions at entry of program.       Cardiac Rehab from 10/22/2015 in [facility] Cardiac Rehab   Date  08/28/15   Educator  DW   Instruction Review Code  2- meets goals/outcomes       Knowledge Questionnaire Score:     Knowledge Questionnaire Score - 08/28/15 1858    Knowledge Questionnaire Score   Pre Score 23/28      Personal Goals and Risk Factors at Admission:     Personal Goals and Risk Factors at Admission - 08/28/15 1902    Personal Goals and Risk Factors on Admission    Weight Management Yes   Intervention Learn and follow  the exercise and diet guidelines while in the program. Utilize the nutrition and education classes to help gain knowledge of the diet and exercise expectations in the program   Admit Weight 222 lb 8 oz (100.925 kg)   Goal Weight 205 lb (92.987 kg)   Increase Aerobic Exercise and Physical Activity Yes   Intervention While in program, learn and follow the exercise prescription taught. Start at a low level workload and increase workload after able to maintain previous level for 30 minutes. Increase time before increasing intensity.   Diabetes Yes   Goal Blood glucose control identified by blood glucose values, HgbA1C. Participant verbalizes understanding of the signs/symptoms of hyper/hypo glycemia, proper foot care and importance of medication and nutrition plan for blood glucose control.   Intervention Provide nutrition & aerobic exercise along with prescribed medications to achieve blood glucose in normal ranges: Fasting 65-99 mg/dL   Hypertension Yes   Goal Participant will see blood pressure controlled within the values of 140/90m/Hg or within value directed by their physician.   Intervention Provide nutrition & aerobic exercise along with prescribed medications to achieve BP 140/90 or less.   Lipids Yes   Goal Cholesterol controlled with medications as prescribed, with individualized exercise RX and with personalized nutrition plan. Value goals: LDL < 714m HDL > 4027mParticipant states  understanding of desired cholesterol values and following prescriptions.   Intervention Provide nutrition & aerobic exercise along with prescribed medications to achieve LDL <44m64mDL >40mg61mStress Yes   Goal To meet with psychosocial counselor for stress and relaxation information and guidance. To state understanding of performing relaxation techniques and or identifying personal stressors.   Intervention Provide education on types of stress, identifiying stressors, and ways to cope with stress. Provide demonstration and active practice of relaxation techniques.      Personal Goals and Risk Factors Review:      Goals and Risk Factor Review      09/11/15 0909 09/24/15 1014 09/24/15 1020 10/24/15 1032     Weight Management   Goals Progress/Improvement seen   Yes     Comments   Idrissa said before his surgery his weight was 236 lbs then after surgery he was 226lbs and now 220 lbs but he wants to lose 10 more lbs.      Increase Aerobic Exercise and Physical Activity   Goals Progress/Improvement seen  Yes Yes  Yes    Comments Birl Nickalausincreased his Cardiac Rehab exercise workloads.  Damarion reports that he has been 9 weeks out of surgery and he feels like he is getting stronger every week. Aidynn reports he is getting around better especially in a store.       Diabetes   Goal --  Stable blood sugars for Kamon.  --  Stable blood sugars.       Progress seen towards goals    Yes    Comments    Ezekiel reports his blood sugar levels are good.     Hypertension   Goal --  Aldridge's blood pressure has been good.  --  Braxtin Jenny Reichmannlood pressure has been very good in Cardiac Rehb.       Progress seen toward goals    Yes    Comments    Stable blood pressure    Abnormal Lipids   Goal  --  Baylee Jelan get his lipid blood work rechecked by his MD in the future.       Progress seen towards goals  Unknown    Stress   Goal  --  Johns stress has decreased since starting Cardiac Rehab.       Progress seen towards  goals    Yes    Comments    exercise is helping.       Personal Goals Discharge (Final Personal Goals and Risk Factors Review):      Goals and Risk Factor Review - 10/24/15 1032    Increase Aerobic Exercise and Physical Activity   Goals Progress/Improvement seen  Yes   Diabetes   Progress seen towards goals Yes   Comments Huriel reports his blood sugar levels are good.    Hypertension   Progress seen toward goals Yes   Comments Stable blood pressure   Abnormal Lipids   Progress seen towards goals Unknown   Stress   Progress seen towards goals Yes   Comments exercise is helping.      ITP Comments:     ITP Comments      10/08/15 1332           ITP Comments 30 day review preparation  Continue with ITP          Comments: Yoni said he pushes himself to level 14 on the XR recumbent elliptical and feels he gets a great workout with it.

## 2015-10-29 ENCOUNTER — Encounter: Payer: Self-pay | Admitting: *Deleted

## 2015-10-29 DIAGNOSIS — Z951 Presence of aortocoronary bypass graft: Secondary | ICD-10-CM | POA: Diagnosis not present

## 2015-10-29 DIAGNOSIS — Z006 Encounter for examination for normal comparison and control in clinical research program: Secondary | ICD-10-CM

## 2015-10-29 NOTE — Progress Notes (Signed)
Daily Session Note  Patient Details  Name: Shannon Chung MRN: [id number] Date of Birth: 28-Jan-1941 Referring Provider:  Minna Merritts, MD  Encounter Date: 10/29/2015  Check In:     Session Check In - 10/29/15 0932    Check-In   Staff Present Candiss Norse, MS, ACSM CEP, Exercise Physiologist;Diane Joya Gaskins, RN, Apolonio Schneiders, BS, Exercise Physiologist   ER physicians immediately available to respond to emergencies See telemetry face sheet for immediately available ER MD   Medication changes reported     No   Fall or balance concerns reported    No   Warm-up and Cool-down Performed on first and last piece of equipment   VAD Patient? No   Pain Assessment   Currently in Pain? No/denies         Goals Met:  Independence with exercise equipment Exercise tolerated well No report of cardiac concerns or symptoms Strength training completed today  Goals Unmet:  Not Applicable  Goals Comments:    Dr. Emily Filbert is Medical Director for [facility].

## 2015-10-29 NOTE — Progress Notes (Signed)
Called Shannon Chung for his final LEVO-CTS follow-up. He has been doing well. He has had no hospitalizations since his surgery.

## 2015-10-31 DIAGNOSIS — Z951 Presence of aortocoronary bypass graft: Secondary | ICD-10-CM | POA: Diagnosis not present

## 2015-10-31 NOTE — Progress Notes (Signed)
Daily Session Note  Patient Details  Name: Derreon Consalvo MRN: [id number] Date of Birth: Sep 14, 1941 Referring Provider:  Minna Merritts, MD  Encounter Date: 10/31/2015  Check In:     Session Check In - 10/31/15 0856    Check-In   Staff Present Gerlene Burdock, RN, BSN;Kendall Caprice Beaver, BS, Exercise Physiologist;Shanquita Ronning, BS, ACSM EP-C, Exercise Physiologist   ER physicians immediately available to respond to emergencies See telemetry face sheet for immediately available ER MD   Medication changes reported     No   Fall or balance concerns reported    No   Warm-up and Cool-down Performed on first and last piece of equipment   VAD Patient? No   Pain Assessment   Currently in Pain? No/denies         Goals Met:  Proper associated with RPD/PD & O2 Sat Exercise tolerated well No report of cardiac concerns or symptoms Strength training completed today  Goals Unmet:  Not Applicable  Goals Comments:    Dr. Emily Filbert is Medical Director for [facility].

## 2015-11-01 ENCOUNTER — Encounter: Payer: Self-pay | Admitting: Family Medicine

## 2015-11-03 NOTE — Progress Notes (Signed)
Cardiac Individual Treatment Plan  Patient Details  Name: Shannon Chung MRN: [id number] Date of Birth: [birth year] Referring Provider:  Minna Merritts, MD  Initial Encounter Date:    Visit Diagnosis: S/P CABG x 5  Patient's Home Medications on Admission:  Current outpatient prescriptions:  .  acetaminophen (TYLENOL) 500 MG tablet, Take 500 mg by mouth every 6 (six) hours as needed for mild pain. , Disp: , Rfl:  .  aspirin (ASPIRIN EC) 81 MG EC tablet, Take 81 mg by mouth daily. Swallow whole., Disp: , Rfl:  .  Cholecalciferol (VITAMIN D3) 1000 UNITS CAPS, Take 1 capsule (1,000 Units total) by mouth daily., Disp: 30 capsule, Rfl:  .  fluticasone (FLONASE) 50 MCG/ACT nasal spray, Place 1 spray into both nostrils as needed for allergies. , Disp: , Rfl:  .  lansoprazole (PREVACID) 30 MG capsule, Take 1 capsule (30 mg total) by mouth every morning., Disp: 90 capsule, Rfl: 3 .  lisinopril (PRINIVIL,ZESTRIL) 2.5 MG tablet, Take 1 tablet (2.5 mg total) by mouth daily., Disp: 90 tablet, Rfl: 3 .  meloxicam (MOBIC) 15 MG tablet, Take 7.5 mg by mouth once a week. Saturday or sunday, Disp: , Rfl: 1 .  metoprolol succinate (TOPROL-XL) 25 MG 24 hr tablet, Take 1 tablet (25 mg total) by mouth daily., Disp: 90 tablet, Rfl: 3 .  ranitidine (ZANTAC) 150 MG tablet, Take 1 tablet (150 mg total) by mouth at bedtime., Disp: 180 tablet, Rfl: 1 .  simvastatin (ZOCOR) 40 MG tablet, Take 1 tablet (40 mg total) by mouth daily at 6 PM., Disp: 90 tablet, Rfl: 3  Past Medical History: Past Medical History  Diagnosis Date  . Skin cancer     squamous and basal, sees derm regularly  . 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 [place] thrombus of cardiac apex ([place])     a. 06/2014: LV; resolved with coumadin-->no residual on f/u echo, no longer on coumadin.  . Ischemic cardiomyopathy     a. dilated, EF 35% improved to 45-50% (2015);  b. 07/2015 EF 25-35% by LV gram.  . History of radiation exposure      right vocal cord squamous cell cancer  . Essential hypertension   . Dyslipidemia   . [place] atrial fibrillation ([place]) 1983    a. isolated episode, not on [place].  Marland Kitchen Vitamin D deficiency   . Coronary artery disease     a. 06/2015 Cardiac CT: Ca score 1103 (84th %'ile);  b. 07/2015 Cath: LM 70, LAD 80p, 100/23m D1 70, D2 95, RI 75, RCA 100p/m;  c. 07/2015 CABG x 5 (LIMA->LAD, VG->Diag, VG->OM1->OM2, VG->OM3).  . Osteoarthritis     a. R-shoulder, L-knee ([place])  . Diabetes mellitus without complication ([place] 99/2119 . Impingement syndrome of right shoulder 10/2015    s/p steroid injection Dr MSabra Heck . 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 Former Smoker -- 0.50 packs/day for 10 years  . Types: Cigarettes  . Quit date: 11/02/1978  Smokeless tobacco  . Never Used    Labs: Recent Review Flowsheet Data    Labs for ITP Cardiac and Pulmonary Rehab Latest Ref Rng 07/29/2015 07/29/2015 07/29/2015 07/29/2015 10/14/2015   Cholestrol 0 - 200 mg/dL - - - - 167   LDLCALC 0 - 99 mg/dL - - - - 80   HDL >39.00 mg/dL - - - -  61.00   Trlycerides 0.0 - 149.0 mg/dL - - - - 126.0   Hemoglobin A1c 4.6 - 6.5 % - - - - 6.3   PHART 7.350 - 7.450 7.379 7.345(L) - 7.333(L) -   PCO2ART 35.0 - 45.0 mmHg 36.4 45.5(H) - 45.2(H) -   HCO3 20.0 - 24.0 mEq/L 21.7 24.8(H) - 24.0 -   TCO2 0 - 100 mmol/L _0 -   ACIDBASEDEF 0.0 - 2.0 mmol/L 3.0(H) 1.0 - 2.0 -   O2SAT - 91.0 95.0 - 96.0 -       Exercise Target Goals:    Exercise Program Goal: Individual exercise prescription set with THRR, safety & activity barriers. Participant demonstrates ability to understand and report RPE using BORG scale, to self-measure pulse accurately, and to acknowledge the importance of the exercise prescription.  Exercise Prescription Goal: Starting with aerobic activity 30 plus minutes a day, 3 days per week for initial exercise prescription.  Provide home exercise prescription and guidelines that participant acknowledges understanding prior to discharge.  Activity Barriers & Risk Stratification:     Activity Barriers & Risk Stratification - 08/28/15 1858    Activity Barriers & Risk Stratification   Risk Stratification High      6 Minute Walk:     6 Minute Walk      08/28/15 1420       6 Minute Walk   Phase Initial     Distance 1330 feet     Walk Time 6 minutes     Resting HR 65 bpm     Resting BP 132/78 mmHg     Max Ex. HR 107 bpm     Max Ex. BP 144/60 mmHg     RPE 13     Symptoms Yes (comment)     Comments L knee pain due to arthritis 5/10 pain        Initial Exercise Prescription:     Initial Exercise Prescription - 08/28/15 1400    Date of Initial Exercise Prescription   Date 08/28/15   Treadmill   MPH 2.2   Grade 0   Minutes 10   Bike   Level 0.4   Minutes 15   Recumbant Bike   Level 3   RPM 40   Watts 30   Minutes 15   NuStep   Level 3   Watts 30   Minutes 15   Arm Ergometer   Level 1   Watts 8   Minutes 10   Arm/Foot Ergometer   Level 4   Watts 12   Minutes 10   Cybex   Level 3   RPM 50   Minutes 10   Recumbant Elliptical   Level 1   RPM 40   Watts 10   Minutes 10   Elliptical   Level 1   Speed 3   Minutes 1   REL-XR   Level 3   Watts 40   Minutes 15   Prescription Details   Frequency (times per week) 3   Duration Progress to 30 minutes of continuous aerobic without signs/symptoms of physical distress   Intensity   THRR REST +  30   Ratings of Perceived Exertion 11-15   Progression Continue progressive overload as per policy without signs/symptoms or physical distress.   Resistance Training   Training Prescription Yes   Weight 2   Reps 10-15      Exercise Prescription Changes:     Exercise Prescription Changes  09/10/15 1100 09/17/15 0900 09/17/15 1622 10/01/15 1000 10/15/15 0900   Exercise Review   Progression No Yes No Yes Yes   Response to  Exercise   Blood Pressure (Admit) 110/68 mmHg  122/76 mmHg     Blood Pressure (Exercise) 130/72 mmHg  140/78 mmHg     Blood Pressure (Exit) 106/68 mmHg  120/70 mmHg     Heart Rate (Admit) 71 bpm  83 bpm     Heart Rate (Exercise) 80 bpm  85 bpm     Heart Rate (Exit) 67 bpm  62 bpm     Rating of Perceived Exertion (Exercise) 14  15     Symptoms _0    Comments Patient tolerated exercise well on his first day of class without signs or symptoms. I spoke with patient about exercise prescription and he was able to complete his increases in class today with no signs or symptoms.   Discussed HIIT with Jenny Reichmann, he is going to start with it on Thursday and is excited for another challenge. Reviewed individualized exercise prescription and made increases per departmental policy. Exercise increases were discussed with the patient and they were able to perform the new work loads without issue (no signs or symptoms).  Reviewed individualized exercise prescription and made increases per departmental policy. Exercise increases were discussed with the patient and they were able to perform the new work loads without issue (no signs or symptoms).    Duration Progress to 30 minutes of continuous aerobic without signs/symptoms of physical distress Progress to 30 minutes of continuous aerobic without signs/symptoms of physical distress Progress to 30 minutes of continuous aerobic without signs/symptoms of physical distress Progress to 50 minutes of aerobic without signs/symptoms of physical distress Progress to 50 minutes of aerobic without signs/symptoms of physical distress   Intensity Rest + 30 Rest + 30 Rest + 30 Rest + 30 Rest + 30   Progression Continue progressive overload as per policy without signs/symptoms or physical distress. Continue progressive overload as per policy without signs/symptoms or physical distress. Continue progressive overload as per policy without signs/symptoms or physical distress.  Continue progressive overload as per policy without signs/symptoms or physical distress. Continue progressive overload as per policy without signs/symptoms or physical distress.   Resistance Training   Training Prescription _1    Weight _2 Reps 10-15 10-15 10-15 10-15 10-15   Interval Training   Interval Training No No No Yes Yes   Equipment    REL-XR REL-XR   Recumbant Elliptical   Level 5  =Bio 5  =Bio 5 5  =Bio 5  =Bio   Watts 40 40 40 40 40   Minutes _3 REL-XR   Level _4 Watts 58 80 80 85 120   Minutes _5 35 35     10/31/15 1400           Exercise Review   Progression Yes       Response to Exercise   Blood Pressure (Admit) 120/64 mmHg       Blood Pressure (Exercise) 152/80 mmHg       Blood Pressure (Exit) 138/80 mmHg       Heart Rate (Admit) 77 bpm       Heart Rate (Exercise) 81 bpm       Heart Rate (Exit) 71 bpm       Rating  of Perceived Exertion (Exercise) 14       Symptoms No       Comments --       Duration Progress to 50 minutes of aerobic without signs/symptoms of physical distress       Intensity Rest + 30       Progression Continue progressive overload as per policy without signs/symptoms or physical distress.       Resistance Training   Training Prescription Yes       Weight 3       Reps 10-15       Interval Training   Interval Training Yes       Equipment REL-XR       Recumbant Elliptical   Level 5  =Bio       Watts 40       Minutes 15       REL-XR   Level 12       Watts 120       Minutes 35          Discharge Exercise Prescription (Final Exercise Prescription Changes):     Exercise Prescription Changes - 10/31/15 1400    Exercise Review   Progression Yes   Response to Exercise   Blood Pressure (Admit) 120/64 mmHg   Blood Pressure (Exercise) 152/80 mmHg   Blood Pressure (Exit) 138/80 mmHg   Heart Rate (Admit) 77 bpm   Heart Rate (Exercise) 81 bpm   Heart Rate (Exit) 71 bpm    Rating of Perceived Exertion (Exercise) 14   Symptoms No   Comments --   Duration Progress to 50 minutes of aerobic without signs/symptoms of physical distress   Intensity Rest + 30   Progression Continue progressive overload as per policy without signs/symptoms or physical distress.   Resistance Training   Training Prescription Yes   Weight 3   Reps 10-15   Interval Training   Interval Training Yes   Equipment REL-XR   Recumbant Elliptical   Level 5  =Bio   Watts 40   Minutes 15   REL-XR   Level 12   Watts 120   Minutes 35      Nutrition:  Target Goals: Understanding of nutrition guidelines, daily intake of sodium '1500mg'$ , cholesterol '200mg'$ , calories 30% from fat and 7% or less from saturated fats, daily to have 5 or more servings of fruits and vegetables.  Biometrics:     Pre Biometrics - 08/28/15 1413    Pre Biometrics   Height '6\' 2"'$  (1.88 m)   Weight 222 lb 8 oz (100.925 kg)   Waist Circumference 40.25 inches   Hip Circumference 41.5 inches   Waist to Hip Ratio 0.97 %   BMI (Calculated) 28.6       Nutrition Therapy Plan and Nutrition Goals:     Nutrition Therapy & Goals - 10/01/15 1301    Nutrition Therapy   Diet Instructed on a heart healthy meal plan based on 1900 calories, DASH diet principles as well as diet principles for pre-diabetes   Drug/Food Interactions Statins/Certain Fruits   Fiber 30 grams   Whole Grain Foods 3 servings   Protein 8 ounces/day   Saturated Fats 13 max. grams   Fruits and Vegetables 5 servings/day   Personal Nutrition Goals   Personal Goal #1 To try some of the low sodium products suggested such as StarKist very low sodium tuna or Fiesta Lime Ms.DASH, or [place] low sodium spaghetti sauce   Personal Goal #2 Read  labels for saturated fat, trans fat and sodium.   Personal Goal #3 Increase vegetable/fruit intake to minimum of 5 servings per day.   Personal Goal #4 Balance meals with protein, 2-4 servings of carbohydrate and  non-starchy vegetables.      Nutrition Discharge: Rate Your Plate Scores:     Rate Your Plate - 40/08/67 6195    Rate Your Plate Scores   Pre Score 77   Pre Score % 92 %      Nutrition Goals Re-Evaluation:     Nutrition Goals Re-Evaluation      09/24/15 1002 10/24/15 1032 10/31/15 1647       Personal Goal #1 Re-Evaluation   Personal Goal #1 Lower salt       Goal Progress Seen Yes Yes (p) Yes     Comments Matson reports that he and his wife are label readers now after his surgery and they have cut out all salt. Srijan reports they eat alot of fish and practically no red meat. I made him a Cardiac Rehab registered dietician appt.  Aaliyah reports his sodium intake is much lower.       Personal Goal #2 Re-Evaluation   Goal Progress Seen   (p) Yes     Personal Goal #3 Re-Evaluation   Goal Progress Seen  Yes      Personal Goal #4 Re-Evaluation   Goal Progress Seen  Yes         Psychosocial: Target Goals: Acknowledge presence or absence of depression, maximize coping skills, provide positive support system. Participant is able to verbalize types and ability to use techniques and skills needed for reducing stress and depression.  Initial Review & Psychosocial Screening:     Initial Psych Review & Screening - 08/28/15 [place]? Yes   Comments Patient has fiancee, Rachel Moulds; two daughters and 5 grandchildren.  Patient is affliated with [facility].     Barriers   Psychosocial barriers to participate in program There are no identifiable barriers or psychosocial needs.  Patient stated one year ago is was really stressed when his daughter had cancer and he was helping take care of his grandchildren.  He was worried about his daughter.  She is cancer free at this time.     Screening Interventions   Interventions Encouraged to exercise      Quality of Life Scores:   PHQ-9:     Recent Review Flowsheet Data    Depression screen [facility] 2/9  10/21/2015 08/28/2015   Decreased Interest 0 0   Down, Depressed, Hopeless 0 0   PHQ - 2 Score 0 0   Altered sleeping - 1   Tired, decreased energy - 1   Change in appetite - 0   Feeling bad or failure about yourself  - 0   Trouble concentrating - 0   Moving slowly or fidgety/restless - 0   Suicidal thoughts - 0   PHQ-9 Score - 2   Difficult doing work/chores - Not difficult at all      Psychosocial Evaluation and Intervention:     Psychosocial Evaluation - 10/01/15 0935    Psychosocial Evaluation & Interventions   Interventions Stress management education;Relaxation education;Encouraged to exercise with the program and follow exercise prescription   Comments Counselor met with Mr. Albright today (Mr. Chauncey Cruel) for the initial psychosocial evaluation.  He is a 75 year old who reports having open heart surgery 9 weeks ago.  He states  he has a strong support system with a 23 year significant other and several adult daughters out of state.  Mr. Chauncey Cruel is awaiting knee replacement surgery once he recovers enough.  He also has arthritis and borderline diabetes.  He reports he sleeps well and has a good appetite.  He denies a history of depression or anxiety but recognizes that his younger daughter has been battling cancer for 5 years and this has been especially stressful for him.  Mr. Chauncey Cruel states his mood is "okay" currently having recently moved to [facility] and this has been stressful as well.  His Thanksgiving weekend with couples from  [place] was also "awkward" with everyone concerned about Mr. Cherlynn Polo health and this bothered him.  His goals are to lose weight and increase his stamina and strength in this program.  He states he is already experiencing some of the benefits in feeling stronger since beginning.  Counselor recommended Mr. Chauncey Cruel meet with the dietician to address his weight loss goals and he will also benefit from all of the psychoeducational components of this program, especially stress management.         Psychosocial Re-Evaluation:     Psychosocial Re-Evaluation      09/24/15 1017 10/24/15 1034         Psychosocial Re-Evaluation   Interventions Encouraged to attend Cardiac Rehabilitation for the exercise Encouraged to attend Cardiac Rehabilitation for the exercise      Comments Juergen says Cardiac REhab has increased his strength every week.           Vocational Rehabilitation: Provide vocational rehab assistance to qualifying candidates.   Vocational Rehab Evaluation & Intervention:     Vocational Rehab - 08/28/15 1900    Initial Vocational Rehab Evaluation & Intervention   Assessment shows need for Vocational Rehabilitation No      Education: Education Goals: Education classes will be provided on a weekly basis, covering required topics. Participant will state understanding/return demonstration of topics presented.  Learning Barriers/Preferences:     Learning Barriers/Preferences - 08/28/15 1858    Learning Barriers/Preferences   Learning Barriers Sight;Exercise Concerns   Learning Preferences Written [place]      Education Topics: General Nutrition Guidelines/Fats and Fiber: -Group instruction provided by verbal, written material, models and posters to present the general guidelines for heart healthy nutrition. Gives an explanation and review of dietary fats and fiber.          Cardiac Rehab from 10/31/2015 in [facility] Cardiac and Pulmonary Rehab   Date  10/08/15   Educator  Erlene Quan   Instruction Review Code  2- meets goals/outcomes      Controlling Sodium/Reading Food Labels: -Group verbal and written material supporting the discussion of sodium use in heart healthy nutrition. Review and explanation with models, verbal and written materials for utilization of the food label.      Cardiac Rehab from 10/31/2015 in [facility] Cardiac and Pulmonary Rehab   Date  10/15/15   Educator  CR   Instruction Review Code  2- meets goals/outcomes      Exercise  Physiology & Risk Factors: - Group verbal and written instruction with models to review the exercise physiology of the cardiovascular system and associated critical values. Details cardiovascular disease risk factors and the goals associated with each risk factor.   Aerobic Exercise & Resistance Training: - Gives group verbal and written discussion on the health impact of inactivity. On the components of aerobic and resistive training programs and the benefits of this  training and how to safely progress through these programs.      Cardiac Rehab from 10/31/2015 in [facility] Cardiac and Pulmonary Rehab   Date  09/10/15   Educator  RM   Instruction Review Code  2- meets goals/outcomes      Flexibility, Balance, General Exercise Guidelines: - Provides group verbal and written instruction on the benefits of flexibility and balance training programs. Provides general exercise guidelines with specific guidelines to those with heart or lung disease. Demonstration and skill practice provided.      Cardiac Rehab from 10/31/2015 in [facility] Cardiac and Pulmonary Rehab   Date  09/17/15   Educator  RM   Instruction Review Code  2- meets goals/outcomes      Stress Management: - Provides group verbal and written instruction about the health risks of elevated stress, cause of high stress, and healthy ways to reduce stress.   Depression: - Provides group verbal and written instruction on the correlation between heart/lung disease and depressed mood, treatment options, and the stigmas associated with seeking treatment.      Cardiac Rehab from 10/31/2015 in [facility] Cardiac and Pulmonary Rehab   Date  10/17/15   Educator  CE   Instruction Review Code  2- meets goals/outcomes      Anatomy & Physiology of the Heart: - Group verbal and written instruction and models provide basic cardiac anatomy and physiology, with the coronary electrical and arterial systems. Review of: AMI, Angina, Valve disease, Heart Failure,  Cardiac Arrhythmia, Pacemakers, and the ICD.      Cardiac Rehab from 10/31/2015 in [facility] Cardiac and Pulmonary Rehab   Date  09/24/15   Educator  [place]   Instruction Review Code  2- meets goals/outcomes      Cardiac Procedures: - Group verbal and written instruction and models to describe the testing methods done to diagnose heart disease. Reviews the outcomes of the test results. Describes the treatment choices: Medical Management, Angioplasty, or Coronary Bypass Surgery.      Cardiac Rehab from 10/31/2015 in [facility] Cardiac and Pulmonary Rehab   Date  10/22/15   Educator  DW   Instruction Review Code  2- meets goals/outcomes      Cardiac Medications: - Group verbal and written instruction to review commonly prescribed medications for heart disease. Reviews the medication, class of the drug, and side effects. Includes the steps to properly store meds and maintain the prescription regimen.      Cardiac Rehab from 10/31/2015 in [facility] Cardiac and Pulmonary Rehab   Date  10/31/15   Educator  CE   Instruction Review Code  2- meets goals/outcomes      Go Sex-Intimacy & Heart Disease, Get SMART - Goal Setting: - Group verbal and written instruction through game format to discuss heart disease and the return to sexual intimacy. Provides group verbal and written material to discuss and apply goal setting through the application of the S.M.A.R.T. Method.      Cardiac Rehab from 10/31/2015 in [facility] Cardiac and Pulmonary Rehab   Date  10/22/15   Educator  DW   Instruction Review Code  2- meets goals/outcomes      Other Matters of the Heart: - Provides group verbal, written materials and models to describe Heart Failure, Angina, Valve Disease, and Diabetes in the realm of heart disease. Includes description of the disease process and treatment options available to the cardiac patient.      Cardiac Rehab from 10/31/2015 in [facility] Cardiac and Pulmonary Rehab  Date  10/03/15   Educator  CE    Instruction Review Code  2- meets goals/outcomes      Exercise & Equipment Safety: - Individual verbal instruction and demonstration of equipment use and safety with use of the equipment.      Cardiac Rehab from 10/31/2015 in [facility] Cardiac and Pulmonary Rehab   Date  08/28/15   Educator  DW   Instruction Review Code  1- partially meets, needs review/practice      Infection Prevention: - Provides verbal and written material to individual with discussion of infection control including proper hand washing and proper equipment cleaning during exercise session.      Cardiac Rehab from 10/31/2015 in [facility] Cardiac and Pulmonary Rehab   Date  08/28/15   Educator  DW   Instruction Review Code  2- meets goals/outcomes      Falls Prevention: - Provides verbal and written material to individual with discussion of falls prevention and safety.      Cardiac Rehab from 10/31/2015 in [facility] Cardiac and Pulmonary Rehab   Date  08/28/15   Educator  DW   Instruction Review Code  2- meets goals/outcomes      Diabetes: - Individual verbal and written instruction to review signs/symptoms of diabetes, desired ranges of glucose level fasting, after meals and with exercise. Advice that pre and post exercise glucose checks will be done for 3 sessions at entry of program.      Cardiac Rehab from 10/31/2015 in [facility] Cardiac and Pulmonary Rehab   Date  08/28/15   Educator  DW   Instruction Review Code  2- meets goals/outcomes       Knowledge Questionnaire Score:     Knowledge Questionnaire Score - 08/28/15 1858    Knowledge Questionnaire Score   Pre Score 23/28      Personal Goals and Risk Factors at Admission:     Personal Goals and Risk Factors at Admission - 08/28/15 1902    Personal Goals and Risk Factors on Admission    Weight Management Yes   Intervention Learn and follow the exercise and diet guidelines while in the program. Utilize the nutrition and education classes to help gain  knowledge of the diet and exercise expectations in the program   Admit Weight 222 lb 8 oz (100.925 kg)   Goal Weight 205 lb (92.987 kg)   Increase Aerobic Exercise and Physical Activity Yes   Intervention While in program, learn and follow the exercise prescription taught. Start at a low level workload and increase workload after able to maintain previous level for 30 minutes. Increase time before increasing intensity.   Diabetes Yes   Goal Blood glucose control identified by blood glucose values, HgbA1C. Participant verbalizes understanding of the signs/symptoms of hyper/hypo glycemia, proper foot care and importance of medication and nutrition plan for blood glucose control.   Intervention Provide nutrition & aerobic exercise along with prescribed medications to achieve blood glucose in normal ranges: Fasting 65-99 mg/dL   Hypertension Yes   Goal Participant will see blood pressure controlled within the values of 140/66m/Hg or within value directed by their physician.   Intervention Provide nutrition & aerobic exercise along with prescribed medications to achieve BP 140/90 or less.   Lipids Yes   Goal Cholesterol controlled with medications as prescribed, with individualized exercise RX and with personalized nutrition plan. Value goals: LDL < '70mg'$ , HDL > '40mg'$ . Participant states understanding of desired cholesterol values and following prescriptions.   Intervention Provide nutrition &  aerobic exercise along with prescribed medications to achieve LDL '70mg'$ , HDL >'40mg'$ .   Stress Yes   Goal To meet with psychosocial counselor for stress and relaxation information and guidance. To state understanding of performing relaxation techniques and or identifying personal stressors.   Intervention Provide education on types of stress, identifiying stressors, and ways to cope with stress. Provide demonstration and active practice of relaxation techniques.      Personal Goals and Risk Factors Review:       Goals and Risk Factor Review      09/11/15 0909 09/24/15 1014 09/24/15 1020 10/24/15 1032 10/30/15 0853   Weight Management   Goals Progress/Improvement seen   Yes     Comments   Basilio said before his surgery his weight was 236 lbs then after surgery he was 226lbs and now 220 lbs but he wants to lose 10 more lbs.      Increase Aerobic Exercise and Physical Activity   Goals Progress/Improvement seen  Yes Yes  Yes Yes   Comments Martavion has increased his Cardiac Rehab exercise workloads.  Jermarcus reports that he has been 9 weeks out of surgery and he feels like he is getting stronger every week. Starr reports he is getting around better especially in a store.    Eithan says he continues to feel good and feels confident exerting himself with exercise. He has been very impressed with the increase in his exercise capacity through progression. He can continuously exercise for the entire class time and has made intensity increases on the treadmill and XR machine. He has been encouraged to exercise outside of class and we will follow up with him next month on the details of his home exercise routine.    Diabetes   Goal --  Stable blood sugars for Jaidon.  --  Stable blood sugars.       Progress seen towards goals    Yes    Comments    Rahshawn reports his blood sugar levels are good.     Hypertension   Goal --  Bruin's blood pressure has been good.  --  Jenny Reichmann 's blood pressure has been very good in Cardiac Rehb.       Progress seen toward goals    Yes    Comments    Stable blood pressure    Abnormal Lipids   Goal  --  Dayshon will get his lipid blood work rechecked by his MD in the future.       Progress seen towards goals    Unknown    Stress   Goal  --  Johns stress has decreased since starting Cardiac Rehab.       Progress seen towards goals    Yes    Comments    exercise is helping.       Personal Goals Discharge (Final Personal Goals and Risk Factors Review):      Goals and Risk Factor Review - 10/30/15  0853    Increase Aerobic Exercise and Physical Activity   Goals Progress/Improvement seen  Yes   Comments Warner says he continues to feel good and feels confident exerting himself with exercise. He has been very impressed with the increase in his exercise capacity through progression. He can continuously exercise for the entire class time and has made intensity increases on the treadmill and XR machine. He has been encouraged to exercise outside of class and we will follow up with him next month on the details of his home  exercise routine.       ITP Comments:     ITP Comments      10/08/15 1332 11/03/15 1322         ITP Comments 30 day review preparation  Continue with ITP Ready for 30 day review.  Continue with ITP         Comments:

## 2015-11-05 ENCOUNTER — Encounter: Payer: Medicare Other | Attending: Cardiovascular Disease | Admitting: *Deleted

## 2015-11-05 DIAGNOSIS — Z951 Presence of aortocoronary bypass graft: Secondary | ICD-10-CM | POA: Diagnosis not present

## 2015-11-05 NOTE — Progress Notes (Signed)
Daily Session Note  Patient Details  Name: Adolf Ormiston MRN: [id number] Date of Birth: [birth year] Referring Provider:  Minna Merritts, MD  Encounter Date: 11/05/2015  Check In:     Session Check In - 11/05/15 1012    Check-In   Staff Present Candiss Norse, MS, ACSM CEP, Exercise Physiologist;Diane Joya Gaskins, RN, Apolonio Schneiders, BS, Exercise Physiologist   ER physicians immediately available to respond to emergencies See telemetry face sheet for immediately available ER MD   Medication changes reported     No   Fall or balance concerns reported    No   Warm-up and Cool-down Performed on first and last piece of equipment   VAD Patient? No   Pain Assessment   Currently in Pain? No/denies         Goals Met:  Independence with exercise equipment Exercise tolerated well No report of cardiac concerns or symptoms Strength training completed today  Goals Unmet:  Not Applicable  Goals Comments: Patient completed exercise prescription and all exercise goals during rehab session. The exercise was tolerated well and the patient is progressing in the program.    Dr. Emily Filbert is Medical Director for [facility] and [facility].

## 2015-11-06 ENCOUNTER — Encounter: Payer: Self-pay | Admitting: Cardiovascular Disease

## 2015-11-06 NOTE — Addendum Note (Signed)
Addended by: Lynford Humphrey on: 11/06/2015 11:08 AM   Modules accepted: Orders

## 2015-11-07 DIAGNOSIS — Z951 Presence of aortocoronary bypass graft: Secondary | ICD-10-CM

## 2015-11-07 NOTE — Progress Notes (Signed)
Daily Session Note  Patient Details  Name: Shannon Chung MRN: [id number] Date of Birth: 02/16/1941 Referring Provider:  Minna Merritts, MD  Encounter Date: 11/07/2015  Check In:     Session Check In - 11/07/15 0905    Check-In   Staff Present Lestine Box, BS, ACSM EP-C, Exercise Physiologist;Carroll Enterkin, RN, Apolonio Schneiders, BS, Exercise Physiologist   ER physicians immediately available to respond to emergencies See telemetry face sheet for immediately available ER MD   Medication changes reported     No   Fall or balance concerns reported    No   Warm-up and Cool-down Performed on first and last piece of equipment   VAD Patient? No   Pain Assessment   Currently in Pain? No/denies           Exercise Prescription Changes - 11/07/15 0900    Exercise Review   Progression Yes   Response to Exercise   Blood Pressure (Admit) --   Blood Pressure (Exercise) --   Blood Pressure (Exit) --   Heart Rate (Admit) --   Heart Rate (Exercise) --   Heart Rate (Exit) --   Rating of Perceived Exertion (Exercise) 14   Symptoms No   Comments Increases were made to interval training and discussed with patient. Increases were comleted today in class with no signs or symptoms.    Duration Progress to 50 minutes of aerobic without signs/symptoms of physical distress   Intensity Rest + 30   Progression Continue progressive overload as per policy without signs/symptoms or physical distress.   Resistance Training   Training Prescription Yes   Weight 3   Reps 10-15   Interval Training   Interval Training Yes   Equipment REL-XR   Comments L13    Recumbant Elliptical   Level 5  =Bio   Watts 40   Minutes 15   REL-XR   Level 12   Watts 120   Minutes 35      Goals Met:  Independence with exercise equipment Exercise tolerated well No report of cardiac concerns or symptoms Strength training completed today  Goals Unmet:  Not Applicable  Goals Comments:    Dr. Emily Filbert is Medical Director for [facility] and [facility].

## 2015-11-11 ENCOUNTER — Other Ambulatory Visit: Payer: Self-pay

## 2015-11-11 ENCOUNTER — Ambulatory Visit (INDEPENDENT_AMBULATORY_CARE_PROVIDER_SITE_OTHER): Payer: Medicare Other

## 2015-11-11 ENCOUNTER — Other Ambulatory Visit: Payer: Medicare Other

## 2015-11-11 DIAGNOSIS — I2510 Atherosclerotic heart disease of native coronary artery without angina pectoris: Secondary | ICD-10-CM | POA: Diagnosis not present

## 2015-11-12 DIAGNOSIS — Z951 Presence of aortocoronary bypass graft: Secondary | ICD-10-CM

## 2015-11-12 NOTE — Progress Notes (Signed)
Daily Session Note  Patient Details  Name: Shannon Chung MRN: [id number] Date of Birth: 1941-07-02 Referring Provider:  Minna Merritts, MD  Encounter Date: 11/12/2015  Check In:     Session Check In - 11/12/15 1104    Check-In   Staff Present Heath Lark, RN, BSN, CCRP;Renee Dillard [place], MS, ACSM CEP, Exercise Physiologist;Masaki Rothbauer, BS, ACSM EP-C, Exercise Physiologist   ER physicians immediately available to respond to emergencies See telemetry face sheet for immediately available ER MD   Medication changes reported     No   Fall or balance concerns reported    No   Warm-up and Cool-down Performed on first and last piece of equipment   VAD Patient? No   Pain Assessment   Currently in Pain? No/denies         Goals Met:  Proper associated with RPD/PD & O2 Sat Exercise tolerated well No report of cardiac concerns or symptoms Strength training completed today  Goals Unmet:  Not Applicable  Goals Comments:    Dr. Emily Filbert is Medical Director for [facility] and [facility].

## 2015-11-14 DIAGNOSIS — Z951 Presence of aortocoronary bypass graft: Secondary | ICD-10-CM | POA: Diagnosis not present

## 2015-11-14 NOTE — Progress Notes (Signed)
Daily Session Note  Patient Details  Name: Lorik Guo MRN: [id number] Date of Birth: 1941/04/06 Referring Provider:  Minna Merritts, MD  Encounter Date: 11/14/2015  Check In:     Session Check In - 11/14/15 0903    Check-In   Staff Present Hessie Knows, BS, Exercise Physiologist;Carroll Enterkin, RN, BSN;Luisfernando Brightwell, BS, ACSM EP-C, Exercise Physiologist   ER physicians immediately available to respond to emergencies See telemetry face sheet for immediately available ER MD   Medication changes reported     No   Fall or balance concerns reported    No   Warm-up and Cool-down Performed on first and last piece of equipment   VAD Patient? No   Pain Assessment   Currently in Pain? No/denies         Goals Met:  Proper associated with RPD/PD & O2 Sat Exercise tolerated well No report of cardiac concerns or symptoms Strength training completed today  Goals Unmet:  Not Applicable  Goals Comments:    Dr. Emily Filbert is Medical Director for [facility] and [facility].

## 2015-11-19 VITALS — Ht 74.0 in | Wt 223.5 lb

## 2015-11-19 DIAGNOSIS — Z951 Presence of aortocoronary bypass graft: Secondary | ICD-10-CM

## 2015-11-19 NOTE — Progress Notes (Signed)
Daily Session Note  Patient Details  Name: Chadrick Sprinkle MRN: [id number] Date of Birth: Feb 17, 1941 Referring Provider:  Minna Merritts, MD  Encounter Date: 11/19/2015  Check In:     Session Check In - 11/19/15 1019    Check-In   Staff Present Candiss Norse, MS, ACSM CEP, Exercise Physiologist;Steven Way, BS, ACSM EP-C, Exercise Physiologist;Ekaterini Capitano Caprice [place], [place], Exercise Physiologist   ER physicians immediately available to respond to emergencies See telemetry face sheet for immediately available ER MD   Medication changes reported     No   Fall or balance concerns reported    No   Warm-up and Cool-down Performed on first and last piece of equipment   VAD Patient? No   Pain Assessment   Currently in Pain? No/denies         Goals Met:  Independence with exercise equipment Exercise tolerated well No report of cardiac concerns or symptoms Strength training completed today  Goals Unmet:  Not Applicable  Goals Comments:    Dr. Emily Filbert is Medical Director for [place] [facility] and [facility].

## 2015-11-20 NOTE — Patient Instructions (Signed)
Discharge Instructions  Patient Details  Name: Shannon Chung MRN: [id number] Date of Birth: [birth year] Referring Provider:  Minna Merritts, MD   Number of Visits:   Reason for Discharge:  Patient reached a stable level of exercise. Patient independent in their exercise.  Smoking History:  History  Smoking status  . Former Smoker -- 0.50 packs/day for 10 years  . Types: Cigarettes  . Quit date: 11/02/1978  Smokeless tobacco  . Never Used    Diagnosis:  S/P CABG x 5  Initial Exercise Prescription:     Initial Exercise Prescription - 08/28/15 1400    Date of Initial Exercise Prescription   Date 08/28/15   Treadmill   MPH 2.2   Grade 0   Minutes 10   Bike   Level 0.4   Minutes 15   Recumbant Bike   Level 3   RPM 40   Watts 30   Minutes 15   NuStep   Level 3   Watts 30   Minutes 15   Arm Ergometer   Level 1   Watts 8   Minutes 10   Arm/Foot Ergometer   Level 4   Watts 12   Minutes 10   Cybex   Level 3   RPM 50   Minutes 10   Recumbant Elliptical   Level 1   RPM 40   Watts 10   Minutes 10   Elliptical   Level 1   Speed 3   Minutes 1   REL-XR   Level 3   Watts 40   Minutes 15   Prescription Details   Frequency (times per week) 3   Duration Progress to 30 minutes of continuous aerobic without signs/symptoms of physical distress   Intensity   THRR REST +  30   Ratings of Perceived Exertion 11-15   Progression Continue progressive overload as per policy without signs/symptoms or physical distress.   Resistance Training   Training Prescription Yes   Weight 2   Reps 10-15      Discharge Exercise Prescription (Final Exercise Prescription Changes):     Exercise Prescription Changes - 11/19/15 1300    Response to Exercise   Blood Pressure (Admit) 122/72 mmHg   Blood Pressure (Exercise) 158/84 mmHg   Blood Pressure (Exit) 118/72 mmHg   Heart Rate (Admit) 79 bpm   Heart Rate (Exercise) 100 bpm   Heart Rate (Exit) 71 bpm   Rating of  Perceived Exertion (Exercise) 14   Symptoms No   Comments Completed post 6MW test. Patient is approaching graduation of the program and home exercise plans were discussed. Details of the patient's exercise prescription and what they need to do in order to continue the prescription and progress with exercise were outlined and the patient verbalized understanding. The patient plans to complete all exercise at the fitness center   Duration Progress to 50 minutes of aerobic without signs/symptoms of physical distress   Intensity Rest + 30   Progression Continue progressive overload as per policy without signs/symptoms or physical distress.   Resistance Training   Training Prescription Yes   Weight 3   Reps 10-15   Interval Training   Interval Training Yes   Equipment REL-XR   Comments L13    Recumbant Elliptical   Level 5  =Bio   Watts 40   Minutes 15   REL-XR   Level 12   Watts 120   Minutes 35   Home Exercise Plan   Plans to  continue exercise at [place]:     6 Minute Walk      08/28/15 1420 11/19/15 1332     6 Minute Walk   Phase Initial Discharge    Distance 1330 feet 1477 feet    Distance % Change  11 %    Walk Time 6 minutes 6 minutes    Resting HR 65 bpm 79 bpm    Resting BP 132/78 mmHg 122/72 mmHg    Max Ex. HR 107 bpm 113 bpm    Max Ex. BP 144/60 mmHg 158/84 mmHg    RPE 13 14    Symptoms Yes (comment) No    Comments L knee pain due to arthritis 5/10 pain        Quality of Life:     Quality of Life - 11/20/15 1420    Quality of Life Scores   Health/Function Post 25.71 %   Health/Function % Change -5 %   Socioeconomic Post 27.43 %   Socioeconomic % Change -1 %   Psych/Spiritual Post 24 %   Psych/Spiritual % Change -20 %   Family Post 22.8 %   Family % Change -9 %   GLOBAL Post 25.27 %   GLOBAL % Change -8 %      Personal Goals: Goals established at orientation with interventions provided to work toward goal.      Personal Goals and Risk Factors at Admission - 08/28/15 1902    Personal Goals and Risk Factors on Admission    Weight Management Yes   Intervention Learn and follow the exercise and diet guidelines while in the program. Utilize the nutrition and education classes to help gain knowledge of the diet and exercise expectations in the program   Admit Weight 222 lb 8 oz (100.925 kg)   Goal Weight 205 lb (92.987 kg)   Increase Aerobic Exercise and Physical Activity Yes   Intervention While in program, learn and follow the exercise prescription taught. Start at a low level workload and increase workload after able to maintain previous level for 30 minutes. Increase time before increasing intensity.   Diabetes Yes   Goal Blood glucose control identified by blood glucose values, HgbA1C. Participant verbalizes understanding of the signs/symptoms of hyper/hypo glycemia, proper foot care and importance of medication and nutrition plan for blood glucose control.   Intervention Provide nutrition & aerobic exercise along with prescribed medications to achieve blood glucose in normal ranges: Fasting 65-99 mg/dL   Hypertension Yes   Goal Participant will see blood pressure controlled within the values of 140/20mm/Hg or within value directed by their physician.   Intervention Provide nutrition & aerobic exercise along with prescribed medications to achieve BP 140/90 or less.   Lipids Yes   Goal Cholesterol controlled with medications as prescribed, with individualized exercise RX and with personalized nutrition plan. Value goals: LDL < 70mg , HDL > 40mg . Participant states understanding of desired cholesterol values and following prescriptions.   Intervention Provide nutrition & aerobic exercise along with prescribed medications to achieve LDL 70mg , HDL >40mg .   Stress Yes   Goal To meet with psychosocial counselor for stress and relaxation information and guidance. To state understanding of performing  relaxation techniques and or identifying personal stressors.   Intervention Provide education on types of stress, identifiying stressors, and ways to cope with stress. Provide demonstration and active practice of relaxation techniques.       Personal Goals Discharge:  Goals and Risk Factor Review - 10/30/15 0853    Increase Aerobic Exercise and Physical Activity   Goals Progress/Improvement seen  Yes   Comments Maxwel says he continues to feel good and feels confident exerting himself with exercise. He has been very impressed with the increase in his exercise capacity through progression. He can continuously exercise for the entire class time and has made intensity increases on the treadmill and XR machine. He has been encouraged to exercise outside of class and we will follow up with him next month on the details of his home exercise routine.       Nutrition & Weight - Outcomes:     Pre Biometrics - 08/28/15 1413    Pre Biometrics   Height 6\' 2"  (1.88 m)   Weight 222 lb 8 oz (100.925 kg)   Waist Circumference 40.25 inches   Hip Circumference 41.5 inches   Waist to Hip Ratio 0.97 %   BMI (Calculated) 28.6         Post Biometrics - 11/19/15 1331     Post  Biometrics   Height 6\' 2"  (1.88 m)   Weight 223 lb 8 oz (101.379 kg)   Waist Circumference 41 inches   Hip Circumference 42 inches   Waist to Hip Ratio 0.98 %   BMI (Calculated) 28.8      Nutrition:     Nutrition Therapy & Goals - 10/01/15 1301    Nutrition Therapy   Diet Instructed on a heart healthy meal plan based on 1900 calories, DASH diet principles as well as diet principles for pre-diabetes   Drug/Food Interactions Statins/Certain Fruits   Fiber 30 grams   Whole Grain Foods 3 servings   Protein 8 ounces/day   Saturated Fats 13 max. grams   Fruits and Vegetables 5 servings/day   Personal Nutrition Goals   Personal Goal #1 To try some of the low sodium products suggested such as StarKist very low sodium  tuna or Fiesta Lime Ms.DASH, or [place] low sodium spaghetti sauce   Personal Goal #2 Read labels for saturated fat, trans fat and sodium.   Personal Goal #3 Increase vegetable/fruit intake to minimum of 5 servings per day.   Personal Goal #4 Balance meals with protein, 2-4 servings of carbohydrate and non-starchy vegetables.      Nutrition Discharge:     Rate Your Plate - [id number]    Rate Your Plate Scores   Post Score 86   Post Score % 95.5 %      Education Questionnaire Score:     Knowledge Questionnaire Score - 11/20/15 1424    Knowledge Questionnaire Score   Post Score 27      Goals reviewed with patient; copy given to patient.

## 2015-11-20 NOTE — Progress Notes (Signed)
Cardiac Individual Treatment Plan  Patient Details  Name: Shannon Chung MRN: [id number] Date of Birth: [birth year] Referring Provider:  Minna Merritts, MD  Initial Encounter Date:    Visit Diagnosis: S/P CABG x 5  Patient's Home Medications on Admission:  Current outpatient prescriptions:  .  acetaminophen (TYLENOL) 500 MG tablet, Take 500 mg by mouth every 6 (six) hours as needed for mild pain. , Disp: , Rfl:  .  aspirin (ASPIRIN EC) 81 MG EC tablet, Take 81 mg by mouth daily. Swallow whole., Disp: , Rfl:  .  Cholecalciferol (VITAMIN D3) 1000 UNITS CAPS, Take 1 capsule (1,000 Units total) by mouth daily., Disp: 30 capsule, Rfl:  .  fluticasone (FLONASE) 50 MCG/ACT nasal spray, Place 1 spray into both nostrils as needed for allergies. , Disp: , Rfl:  .  lansoprazole (PREVACID) 30 MG capsule, Take 1 capsule (30 mg total) by mouth every morning., Disp: 90 capsule, Rfl: 3 .  lisinopril (PRINIVIL,ZESTRIL) 2.5 MG tablet, Take 1 tablet (2.5 mg total) by mouth daily., Disp: 90 tablet, Rfl: 3 .  meloxicam (MOBIC) 15 MG tablet, Take 7.5 mg by mouth once a week. Saturday or sunday, Disp: , Rfl: 1 .  metoprolol succinate (TOPROL-XL) 25 MG 24 hr tablet, Take 1 tablet (25 mg total) by mouth daily., Disp: 90 tablet, Rfl: 3 .  ranitidine (ZANTAC) 150 MG tablet, Take 1 tablet (150 mg total) by mouth at bedtime., Disp: 180 tablet, Rfl: 1 .  simvastatin (ZOCOR) 40 MG tablet, Take 1 tablet (40 mg total) by mouth daily at 6 PM., Disp: 90 tablet, Rfl: 3  Past Medical History: Past Medical History  Diagnosis Date  . Skin cancer     squamous and basal, sees derm regularly  . 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 [place]ural thrombus of cardiac apex ([place])     a. 06/2014: LV; resolved with coumadin-->no residual on f/u echo, no longer on coumadin.  . Ischemic cardiomyopathy     a. dilated, EF 35% improved to 45-50% (2015);  b. 07/2015 EF 25-35% by LV gram.  . History of radiation exposure      right vocal cord squamous cell cancer  . Essential hypertension   . Dyslipidemia   . [place] atrial fibrillation ([place]) 1983    a. isolated episode, not on [place].  Marland Kitchen Vitamin D deficiency   . Coronary artery disease     a. 06/2015 Cardiac CT: Shannon Chung score 1103 (84th %'ile);  b. 07/2015 Cath: LM 70, LAD 80p, 100/23m D1 70, D2 95, RI 75, RCA 100p/m;  c. 07/2015 CABG x 5 (LIMA->LAD, VG->Diag, VG->OM1->OM2, VG->OM3).  . Osteoarthritis     a. R-shoulder, L-knee ([place])  . Diabetes mellitus without complication ([place] 99/2119 . Impingement syndrome of right shoulder 10/2015    s/p steroid injection Dr MSabra Heck . 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 Former Smoker -- 0.50 packs/day for 10 years  . Types: Cigarettes  . Quit date: 11/02/1978  Smokeless tobacco  . Never Used    Labs: Recent Review Flowsheet Data    Labs for ITP Cardiac and Pulmonary Rehab Latest Ref Rng 07/29/2015 07/29/2015 07/29/2015 07/29/2015 10/14/2015   Cholestrol 0 - 200 mg/dL - - - - 167   LDLCALC 0 - 99 mg/dL - - - - 80   HDL >39.00 mg/dL - - - -  61.00   Trlycerides 0.0 - 149.0 mg/dL - - - - 126.0   Hemoglobin A1c 4.6 - 6.5 % - - - - 6.3   PHART 7.350 - 7.450 7.379 7.345(L) - 7.333(L) -   PCO2ART 35.0 - 45.0 mmHg 36.4 45.5(H) - 45.2(H) -   HCO3 20.0 - 24.0 mEq/L 21.7 24.8(H) - 24.0 -   TCO2 0 - 100 mmol/L _0 -   ACIDBASEDEF 0.0 - 2.0 mmol/L 3.0(H) 1.0 - 2.0 -   O2SAT - 91.0 95.0 - 96.0 -       Exercise Target Goals:    Exercise Program Goal: Individual exercise prescription set with THRR, safety & activity barriers. Participant demonstrates ability to understand and report RPE using BORG scale, to self-measure pulse accurately, and to acknowledge the importance of the exercise prescription.  Exercise Prescription Goal: Starting with aerobic activity 30 plus minutes a day, 3 days per week for initial exercise prescription.  Provide home exercise prescription and guidelines that participant acknowledges understanding prior to discharge.  Activity Barriers & Risk Stratification:     Activity Barriers & Risk Stratification - 08/28/15 1858    Activity Barriers & Risk Stratification   Risk Stratification High      6 Minute Walk:     6 Minute Walk      08/28/15 1420 11/19/15 1332     6 Minute Walk   Phase Initial Discharge    Distance 1330 feet 1477 feet    Distance % Change  11 %    Walk Time 6 minutes 6 minutes    Resting HR 65 bpm 79 bpm    Resting BP 132/78 mmHg 122/72 mmHg    Max Ex. HR 107 bpm 113 bpm    Max Ex. BP 144/60 mmHg 158/84 mmHg    RPE 13 14    Symptoms Yes (comment) No    Comments L knee pain due to arthritis 5/10 pain        Initial Exercise Prescription:     Initial Exercise Prescription - 08/28/15 1400    Date of Initial Exercise Prescription   Date 08/28/15   Treadmill   MPH 2.2   Grade 0   Minutes 10   Bike   Level 0.4   Minutes 15   Recumbant Bike   Level 3   RPM 40   Watts 30   Minutes 15   NuStep   Level 3   Watts 30   Minutes 15   Arm Ergometer   Level 1   Watts 8   Minutes 10   Arm/Foot Ergometer   Level 4   Watts 12   Minutes 10   Cybex   Level 3   RPM 50   Minutes 10   Recumbant Elliptical   Level 1   RPM 40   Watts 10   Minutes 10   Elliptical   Level 1   Speed 3   Minutes 1   REL-XR   Level 3   Watts 40   Minutes 15   Prescription Details   Frequency (times per week) 3   Duration Progress to 30 minutes of continuous aerobic without signs/symptoms of physical distress   Intensity   THRR REST +  30   Ratings of Perceived Exertion 11-15   Progression Continue progressive overload as per policy without signs/symptoms or physical distress.   Resistance Training   Training Prescription Yes   Weight 2   Reps 10-15  Exercise Prescription Changes:     Exercise Prescription Changes      09/10/15 1100 09/17/15 0900  09/17/15 1622 10/01/15 1000 10/15/15 0900   Exercise Review   Progression No Yes No Yes Yes   Response to Exercise   Blood Pressure (Admit) 110/68 mmHg  122/76 mmHg     Blood Pressure (Exercise) 130/72 mmHg  140/78 mmHg     Blood Pressure (Exit) 106/68 mmHg  120/70 mmHg     Heart Rate (Admit) 71 bpm  83 bpm     Heart Rate (Exercise) 80 bpm  85 bpm     Heart Rate (Exit) 67 bpm  62 bpm     Rating of Perceived Exertion (Exercise) 14  15     Symptoms _0    Comments Patient tolerated exercise well on his first day of class without signs or symptoms. I spoke with patient about exercise prescription and he was able to complete his increases in class today with no signs or symptoms.   Discussed HIIT with Shannon Chung, he is going to start with it on Thursday and is excited for another challenge. Reviewed individualized exercise prescription and made increases per departmental policy. Exercise increases were discussed with the patient and they were able to perform the new work loads without issue (no signs or symptoms).  Reviewed individualized exercise prescription and made increases per departmental policy. Exercise increases were discussed with the patient and they were able to perform the new work loads without issue (no signs or symptoms).    Duration Progress to 30 minutes of continuous aerobic without signs/symptoms of physical distress Progress to 30 minutes of continuous aerobic without signs/symptoms of physical distress Progress to 30 minutes of continuous aerobic without signs/symptoms of physical distress Progress to 50 minutes of aerobic without signs/symptoms of physical distress Progress to 50 minutes of aerobic without signs/symptoms of physical distress   Intensity Rest + 30 Rest + 30 Rest + 30 Rest + 30 Rest + 30   Progression Continue progressive overload as per policy without signs/symptoms or physical distress. Continue progressive overload as per policy without signs/symptoms or  physical distress. Continue progressive overload as per policy without signs/symptoms or physical distress. Continue progressive overload as per policy without signs/symptoms or physical distress. Continue progressive overload as per policy without signs/symptoms or physical distress.   Resistance Training   Training Prescription _1    Weight _2 Reps 10-15 10-15 10-15 10-15 10-15   Interval Training   Interval Training No No No Yes Yes   Equipment    REL-XR REL-XR   Recumbant Elliptical   Level 5  =Bio 5  =Bio 5 5  =Bio 5  =Bio   Watts 40 40 40 40 40   Minutes _3 REL-XR   Level _4 Watts 58 80 80 85 120   Minutes _5 35 35     10/31/15 1400 11/07/15 0900 11/19/15 1300       Exercise Review   Progression Yes Yes      Response to Exercise   Blood Pressure (Admit) 120/64 mmHg -- 122/72 mmHg     Blood Pressure (Exercise) 152/80 mmHg -- 158/84 mmHg     Blood Pressure (Exit) 138/80 mmHg -- 118/72 mmHg     Heart Rate (Admit) 77 bpm -- 79 bpm     Heart Rate (Exercise) 81  bpm -- 100 bpm     Heart Rate (Exit) 71 bpm -- 71 bpm     Rating of Perceived Exertion (Exercise) _0 Symptoms No No No     Comments -- Increases were made to interval training and discussed with patient. Increases were comleted today in class with no signs or symptoms.  Completed post 6MW test. Patient is approaching graduation of the program and home exercise plans were discussed. Details of the patient's exercise prescription and what they need to do in order to continue the prescription and progress with exercise were outlined and the patient verbalized understanding. The patient plans to complete all exercise at the fitness center     Duration Progress to 50 minutes of aerobic without signs/symptoms of physical distress Progress to 50 minutes of aerobic without signs/symptoms of physical distress Progress to 50 minutes of aerobic without signs/symptoms of  physical distress     Intensity Rest + 30 Rest + 30 Rest + 30     Progression Continue progressive overload as per policy without signs/symptoms or physical distress. Continue progressive overload as per policy without signs/symptoms or physical distress. Continue progressive overload as per policy without signs/symptoms or physical distress.     Resistance Training   Training Prescription Yes Yes Yes     Weight _1 Reps 10-15 10-15 10-15     Interval Training   Interval Training Yes Yes Yes     Equipment REL-XR REL-XR REL-XR     Comments  L13  L13      Recumbant Elliptical   Level 5  =Bio 5  =Bio 5  =Bio     Watts 40 40 40     Minutes _2 REL-XR   Level _3 Watts 120 120 120     Minutes 35 35 35     Home Exercise Plan   Plans to continue exercise at   [facility]        Discharge Exercise Prescription (Final Exercise Prescription Changes):     Exercise Prescription Changes - 11/19/15 1300    Response to Exercise   Blood Pressure (Admit) 122/72 mmHg   Blood Pressure (Exercise) 158/84 mmHg   Blood Pressure (Exit) 118/72 mmHg   Heart Rate (Admit) 79 bpm   Heart Rate (Exercise) 100 bpm   Heart Rate (Exit) 71 bpm   Rating of Perceived Exertion (Exercise) 14   Symptoms No   Comments Completed post 6MW test. Patient is approaching graduation of the program and home exercise plans were discussed. Details of the patient's exercise prescription and what they need to do in order to continue the prescription and progress with exercise were outlined and the patient verbalized understanding. The patient plans to complete all exercise at the fitness center   Duration Progress to 50 minutes of aerobic without signs/symptoms of physical distress   Intensity Rest + 30   Progression Continue progressive overload as per policy without signs/symptoms or physical distress.   Resistance Training   Training Prescription Yes   Weight 3   Reps 10-15   Interval  Training   Interval Training Yes   Equipment REL-XR   Comments L13    Recumbant Elliptical   Level 5  =Bio   Watts 40   Minutes 15   REL-XR   Level 12   Watts 120   Minutes 35  Home Exercise Plan   Plans to continue exercise at [facility]      Nutrition:  Target Goals: Understanding of nutrition guidelines, daily intake of sodium <1531m, cholesterol <2062m calories 30% from fat and 7% or less from saturated fats, daily to have 5 or more servings of fruits and vegetables.  Biometrics:     Pre Biometrics - 08/28/15 1413    Pre Biometrics   Height _0  (1.88 m)   Weight 222 lb 8 oz (100.925 kg)   Waist Circumference 40.25 inches   Hip Circumference 41.5 inches   Waist to Hip Ratio 0.97 %   BMI (Calculated) 28.6         Post Biometrics - 11/19/15 1331     Post  Biometrics   Height _1  (1.88 m)   Weight 223 lb 8 oz (101.379 kg)   Waist Circumference 41 inches   Hip Circumference 42 inches   Waist to Hip Ratio 0.98 %   BMI (Calculated) 28.8      Nutrition Therapy Plan and Nutrition Goals:     Nutrition Therapy & Goals - 10/01/15 1301    Nutrition Therapy   Diet Instructed on a heart healthy meal plan based on 1900 calories, DASH diet principles as well as diet principles for pre-diabetes   Drug/Food Interactions Statins/Certain Fruits   Fiber 30 grams   Whole Grain Foods 3 servings   Protein 8 ounces/day   Saturated Fats 13 max. grams   Fruits and Vegetables 5 servings/day   Personal Nutrition Goals   Personal Goal #1 To try some of the low sodium products suggested such as StarKist very low sodium tuna or Fiesta Lime Ms.DASH, or ViErit[place]ow sodium spaghetti sauce   Personal Goal #2 Read labels for saturated fat, trans fat and sodium.   Personal Goal #3 Increase vegetable/fruit intake to minimum of 5 servings per day.   Personal Goal #4 Balance meals with protein, 2-4 servings of carbohydrate and non-starchy vegetables.      Nutrition  Discharge: Rate Your Plate Scores:     Rate Your Plate - 0156/31/4947026  Rate Your Plate Scores   Post Score 86   Post Score % 95.5 %      Nutrition Goals Re-Evaluation:     Nutrition Goals Re-Evaluation      09/24/15 1002 10/24/15 1032 10/31/15 1647       Personal Goal #1 Re-Evaluation   Personal Goal #1 Lower salt       Goal Progress Seen Yes Yes (p) Yes     Comments Shannon Chung reports that he and his wife are label readers now after his surgery and they have cut out all salt. Shannon Chung reports they eat alot of fish and practically no red meat. I made him a Cardiac Rehab registered dietician appt.  Shannon Chung reports his sodium intake is much lower.       Personal Goal #2 Re-Evaluation   Goal Progress Seen   (p) Yes     Personal Goal #3 Re-Evaluation   Goal Progress Seen  Yes      Personal Goal #4 Re-Evaluation   Goal Progress Seen  Yes         Psychosocial: Target Goals: Acknowledge presence or absence of depression, maximize coping skills, provide positive support system. Participant is able to verbalize types and ability to use techniques and skills needed for reducing stress and depression.  Initial Review & Psychosocial Screening:     Initial Psych  Review & Screening - 08/28/15 Shannon Chung? Yes   Comments Patient has fiancee, Rachel Moulds; two daughters and 5 grandchildren.  Patient is affliated with [facility].     Barriers   Psychosocial barriers to participate in program There are no identifiable barriers or psychosocial needs.  Patient stated one year ago is was really stressed when his daughter had cancer and he was helping take care of his grandchildren.  He was worried about his daughter.  She is cancer free at this time.     Screening Interventions   Interventions Encouraged to exercise      Quality of Life Scores:     Quality of Life - 11/20/15 1420    Quality of Life Scores   Health/Function Post 25.71 %   Health/Function %  Change -5 %   Socioeconomic Post 27.43 %   Socioeconomic % Change -1 %   Psych/Spiritual Post 24 %   Psych/Spiritual % Change -20 %   Family Post 22.8 %   Family % Change -9 %   GLOBAL Post 25.27 %   GLOBAL % Change -8 %      PHQ-9:     Recent Review Flowsheet Data    Depression screen [facility] 2/9 11/20/2015 10/21/2015 08/28/2015   Decreased Interest 1 0 0   Down, Depressed, Hopeless 1 0 0   PHQ - 2 Score 2 0 0   Altered sleeping 0 - 1   Tired, decreased energy 1 - 1   Change in appetite 0 - 0   Feeling bad or failure about yourself  0 - 0   Trouble concentrating 0 - 0   Moving slowly or fidgety/restless 0 - 0   Suicidal thoughts 0 - 0   PHQ-9 Score 3 - 2   Difficult doing work/chores Somewhat difficult - Not difficult at all      Psychosocial Evaluation and Intervention:     Psychosocial Evaluation - 10/01/15 0935    Psychosocial Evaluation & Interventions   Interventions Stress management education;Relaxation education;Encouraged to exercise with the program and follow exercise prescription   Comments Counselor met with Shannon Chung today (Shannon Chung) for the initial psychosocial evaluation.  He is a 75 year old who reports having open heart surgery 9 weeks ago.  He states he has a strong support system with a 23 year significant other and several adult daughters out of state.  Shannon Chung is awaiting knee replacement surgery once he recovers enough.  He also has arthritis and borderline diabetes.  He reports he sleeps well and has a good appetite.  He denies a history of depression or anxiety but recognizes that his younger daughter has been battling cancer for 5 years and this has been especially stressful for him.  Shannon Chung states his mood is "okay" currently having recently moved to [facility] and this has been stressful as well.  His Thanksgiving weekend with couples from  [place] was also "awkward" with everyone concerned about Mr. Shannon Chung health and this bothered him.  His goals are to lose weight  and increase his stamina and strength in this program.  He states he is already experiencing some of the benefits in feeling stronger since beginning.  Counselor recommended Shannon Chung meet with the dietician to address his weight loss goals and he will also benefit from all of the psychoeducational components of this program, especially stress management.        Psychosocial Re-Evaluation:  Psychosocial Re-Evaluation      09/24/15 1017 10/24/15 1034         Psychosocial Re-Evaluation   Interventions Encouraged to attend Cardiac Rehabilitation for the exercise Encouraged to attend Cardiac Rehabilitation for the exercise      Comments Shannon Chung says Cardiac REhab has increased his strength every week.           Vocational Rehabilitation: Provide vocational rehab assistance to qualifying candidates.   Vocational Rehab Evaluation & Intervention:     Vocational Rehab - 08/28/15 1900    Initial Vocational Rehab Evaluation & Intervention   Assessment shows need for Vocational Rehabilitation No      Education: Education Goals: Education classes will be provided on a weekly basis, covering required topics. Participant will state understanding/return demonstration of topics presented.  Learning Barriers/Preferences:     Learning Barriers/Preferences - 08/28/15 1858    Learning Barriers/Preferences   Learning Barriers Sight;Exercise Concerns   Learning Preferences Written [place]      Education Topics: General Nutrition Guidelines/Fats and Fiber: -Group instruction provided by verbal, written material, models and posters to present the general guidelines for heart healthy nutrition. Gives an explanation and review of dietary fats and fiber.          Cardiac Rehab from 11/19/2015 in [facility] Cardiac and Pulmonary Rehab   Date  10/08/15   Educator  Erlene Quan   Instruction Review Code  2- meets goals/outcomes      Controlling Sodium/Reading Food Labels: -Group verbal and  written material supporting the discussion of sodium use in heart healthy nutrition. Review and explanation with models, verbal and written materials for utilization of the food label.      Cardiac Rehab from 11/19/2015 in [facility] Cardiac and Pulmonary Rehab   Date  10/15/15   Educator  CR   Instruction Review Code  2- meets goals/outcomes      Exercise Physiology & Risk Factors: - Group verbal and written instruction with models to review the exercise physiology of the cardiovascular system and associated critical values. Details cardiovascular disease risk factors and the goals associated with each risk factor.      Cardiac Rehab from 11/19/2015 in [facility] Cardiac and Pulmonary Rehab   Date  11/07/15   Educator  SW   Instruction Review Code  2- meets goals/outcomes      Aerobic Exercise & Resistance Training: - Gives group verbal and written discussion on the health impact of inactivity. On the components of aerobic and resistive training programs and the benefits of this training and how to safely progress through these programs.      Cardiac Rehab from 11/19/2015 in [facility] Cardiac and Pulmonary Rehab   Date  11/19/15   Educator  RM   Instruction Review Code  2- meets goals/outcomes      Flexibility, Balance, General Exercise Guidelines: - Provides group verbal and written instruction on the benefits of flexibility and balance training programs. Provides general exercise guidelines with specific guidelines to those with heart or lung disease. Demonstration and skill practice provided.      Cardiac Rehab from 11/19/2015 in [facility] Cardiac and Pulmonary Rehab   Date  11/19/15   Educator  RM   Instruction Review Code  2- meets goals/outcomes      Stress Management: - Provides group verbal and written instruction about the health risks of elevated stress, cause of high stress, and healthy ways to reduce stress.      Cardiac Rehab from 11/19/2015 in [facility] Cardiac  and Pulmonary Rehab   Date   11/14/15   Educator  CE   Instruction Review Code  2- meets goals/outcomes      Depression: - Provides group verbal and written instruction on the correlation between heart/lung disease and depressed mood, treatment options, and the stigmas associated with seeking treatment.      Cardiac Rehab from 11/19/2015 in [facility] Cardiac and Pulmonary Rehab   Date  10/17/15   Educator  CE   Instruction Review Code  2- meets goals/outcomes      Anatomy & Physiology of the Heart: - Group verbal and written instruction and models provide basic cardiac anatomy and physiology, with the coronary electrical and arterial systems. Review of: AMI, Angina, Valve disease, Heart Failure, Cardiac Arrhythmia, Pacemakers, and the ICD.      Cardiac Rehab from 11/19/2015 in [facility] Cardiac and Pulmonary Rehab   Date  09/24/15   Educator  [place]   Instruction Review Code  2- meets goals/outcomes      Cardiac Procedures: - Group verbal and written instruction and models to describe the testing methods done to diagnose heart disease. Reviews the outcomes of the test results. Describes the treatment choices: Medical Management, Angioplasty, or Coronary Bypass Surgery.      Cardiac Rehab from 11/19/2015 in [facility] Cardiac and Pulmonary Rehab   Date  10/22/15   Educator  DW   Instruction Review Code  2- meets goals/outcomes      Cardiac Medications: - Group verbal and written instruction to review commonly prescribed medications for heart disease. Reviews the medication, class of the drug, and side effects. Includes the steps to properly store meds and maintain the prescription regimen.      Cardiac Rehab from 11/19/2015 in [facility] Cardiac and Pulmonary Rehab   Date  10/31/15   Educator  CE   Instruction Review Code  2- meets goals/outcomes      Go Sex-Intimacy & Heart Disease, Get SMART - Goal Setting: - Group verbal and written instruction through game format to discuss heart disease and the return to sexual intimacy.  Provides group verbal and written material to discuss and apply goal setting through the application of the S.M.A.R.T. Method.      Cardiac Rehab from 11/19/2015 in [facility] Cardiac and Pulmonary Rehab   Date  10/22/15   Educator  DW   Instruction Review Code  2- meets goals/outcomes      Other Matters of the Heart: - Provides group verbal, written materials and models to describe Heart Failure, Angina, Valve Disease, and Diabetes in the realm of heart disease. Includes description of the disease process and treatment options available to the cardiac patient.      Cardiac Rehab from 11/19/2015 in [facility] Cardiac and Pulmonary Rehab   Date  10/03/15   Educator  CE   Instruction Review Code  2- meets goals/outcomes      Exercise & Equipment Safety: - Individual verbal instruction and demonstration of equipment use and safety with use of the equipment.      Cardiac Rehab from 11/19/2015 in [facility] Cardiac and Pulmonary Rehab   Date  08/28/15   Educator  DW   Instruction Review Code  1- partially meets, needs review/practice      Infection Prevention: - Provides verbal and written material to individual with discussion of infection control including proper hand washing and proper equipment cleaning during exercise session.      Cardiac Rehab from 11/19/2015 in [facility] Cardiac and Pulmonary Rehab  Date  08/28/15   Educator  DW   Instruction Review Code  2- meets goals/outcomes      Falls Prevention: - Provides verbal and written material to individual with discussion of falls prevention and safety.      Cardiac Rehab from 11/19/2015 in [facility] Cardiac and Pulmonary Rehab   Date  08/28/15   Educator  DW   Instruction Review Code  2- meets goals/outcomes      Diabetes: - Individual verbal and written instruction to review signs/symptoms of diabetes, desired ranges of glucose level fasting, after meals and with exercise. Advice that pre and post exercise glucose checks will be done for 3 sessions at  entry of program.      Cardiac Rehab from 11/19/2015 in [facility] Cardiac and Pulmonary Rehab   Date  08/28/15   Educator  DW   Instruction Review Code  2- meets goals/outcomes       Knowledge Questionnaire Score:     Knowledge Questionnaire Score - 11/20/15 1424    Knowledge Questionnaire Score   Post Score 27      Personal Goals and Risk Factors at Admission:     Personal Goals and Risk Factors at Admission - 08/28/15 1902    Personal Goals and Risk Factors on Admission    Weight Management Yes   Intervention Learn and follow the exercise and diet guidelines while in the program. Utilize the nutrition and education classes to help gain knowledge of the diet and exercise expectations in the program   Admit Weight 222 lb 8 oz (100.925 kg)   Goal Weight 205 lb (92.987 kg)   Increase Aerobic Exercise and Physical Activity Yes   Intervention While in program, learn and follow the exercise prescription taught. Start at a low level workload and increase workload after able to maintain previous level for 30 minutes. Increase time before increasing intensity.   Diabetes Yes   Goal Blood glucose control identified by blood glucose values, HgbA1C. Participant verbalizes understanding of the signs/symptoms of hyper/hypo glycemia, proper foot care and importance of medication and nutrition plan for blood glucose control.   Intervention Provide nutrition & aerobic exercise along with prescribed medications to achieve blood glucose in normal ranges: Fasting 65-99 mg/dL   Hypertension Yes   Goal Participant will see blood pressure controlled within the values of 140/53m/Hg or within value directed by their physician.   Intervention Provide nutrition & aerobic exercise along with prescribed medications to achieve BP 140/90 or less.   Lipids Yes   Goal Cholesterol controlled with medications as prescribed, with individualized exercise RX and with personalized nutrition plan. Value goals: LDL < 717m  HDL > 4036mParticipant states understanding of desired cholesterol values and following prescriptions.   Intervention Provide nutrition & aerobic exercise along with prescribed medications to achieve LDL <70m34mDL >40mg64mStress Yes   Goal To meet with psychosocial counselor for stress and relaxation information and guidance. To state understanding of performing relaxation techniques and or identifying personal stressors.   Intervention Provide education on types of stress, identifiying stressors, and ways to cope with stress. Provide demonstration and active practice of relaxation techniques.      Personal Goals and Risk Factors Review:      Goals and Risk Factor Review      09/11/15 0909 09/24/15 1014 09/24/15 1020 10/24/15 1032 10/30/15 0853   Weight Management   Goals Progress/Improvement seen   Yes     Comments   Shannon Chung said before  his surgery his weight was 236 lbs then after surgery he was 226lbs and now 220 lbs but he wants to lose 10 more lbs.      Increase Aerobic Exercise and Physical Activity   Goals Progress/Improvement seen  Yes Yes  Yes Yes   Comments Shannon Chung has increased his Cardiac Rehab exercise workloads.  Shannon Chung reports that he has been 9 weeks out of surgery and he feels like he is getting stronger every week. Shannon Chung reports he is getting around better especially in a store.    Shannon Chung says he continues to feel good and feels confident exerting himself with exercise. He has been very impressed with the increase in his exercise capacity through progression. He can continuously exercise for the entire class time and has made intensity increases on the treadmill and XR machine. He has been encouraged to exercise outside of class and we will follow up with him next month on the details of his home exercise routine.    Diabetes   Goal --  Stable blood sugars for Shannon Chung.  --  Stable blood sugars.       Progress seen towards goals    Yes    Comments    Kariem reports his blood sugar levels  are good.     Hypertension   Goal --  Shannon Chung blood pressure has been good.  --  Shannon Chung 's blood pressure has been very good in Cardiac Rehb.       Progress seen toward goals    Yes    Comments    Stable blood pressure    Abnormal Lipids   Goal  --  Jazper will get his lipid blood work rechecked by his MD in the future.       Progress seen towards goals    Unknown    Stress   Goal  --  Shannon Chung stress has decreased since starting Cardiac Rehab.       Progress seen towards goals    Yes    Comments    exercise is helping.       Personal Goals Discharge (Final Personal Goals and Risk Factors Review):      Goals and Risk Factor Review - 10/30/15 0853    Increase Aerobic Exercise and Physical Activity   Goals Progress/Improvement seen  Yes   Comments Shannon Chung says he continues to feel good and feels confident exerting himself with exercise. He has been very impressed with the increase in his exercise capacity through progression. He can continuously exercise for the entire class time and has made intensity increases on the treadmill and XR machine. He has been encouraged to exercise outside of class and we will follow up with him next month on the details of his home exercise routine.       ITP Comments:     ITP Comments      10/08/15 1332 11/03/15 1322         ITP Comments 30 day review preparation  Continue with ITP Ready for 30 day review.  Continue with ITP         Comments: Ready for discharge. See goals under goal section. Caedan several times after education classes came up to [place] and said he really got a lot out of the Cardiac Rehab education.

## 2015-11-21 VITALS — BP 116/68 | HR 71 | Wt 222.0 lb

## 2015-11-21 DIAGNOSIS — Z951 Presence of aortocoronary bypass graft: Secondary | ICD-10-CM

## 2015-11-21 NOTE — Patient Instructions (Signed)
Discharge Instructions  Patient Details  Name: Shannon Chung MRN: [id number] Date of Birth: Nov 29, 1940 Referring Provider:  Minna Merritts, MD   Number of Visits: 36/36  Reason for Discharge:  Patient reached a stable level of exercise. Patient independent in their exercise.  Smoking History:  History  Smoking status  . Former Smoker -- 0.50 packs/day for 10 years  . Types: Cigarettes  . Quit date: 11/02/1978  Smokeless tobacco  . Never Used    Diagnosis:  S/P CABG x 5  Initial Exercise Prescription:     Initial Exercise Prescription - 08/28/15 1400    Date of Initial Exercise Prescription   Date 08/28/15   Treadmill   MPH 2.2   Grade 0   Minutes 10   Bike   Level 0.4   Minutes 15   Recumbant Bike   Level 3   RPM 40   Watts 30   Minutes 15   NuStep   Level 3   Watts 30   Minutes 15   Arm Ergometer   Level 1   Watts 8   Minutes 10   Arm/Foot Ergometer   Level 4   Watts 12   Minutes 10   Cybex   Level 3   RPM 50   Minutes 10   Recumbant Elliptical   Level 1   RPM 40   Watts 10   Minutes 10   Elliptical   Level 1   Speed 3   Minutes 1   REL-XR   Level 3   Watts 40   Minutes 15   Prescription Details   Frequency (times per week) 3   Duration Progress to 30 minutes of continuous aerobic without signs/symptoms of physical distress   Intensity   THRR REST +  30   Ratings of Perceived Exertion 11-15   Progression Continue progressive overload as per policy without signs/symptoms or physical distress.   Resistance Training   Training Prescription Yes   Weight 2   Reps 10-15      Discharge Exercise Prescription (Final Exercise Prescription Changes):     Exercise Prescription Changes - 11/19/15 1300    Response to Exercise   Blood Pressure (Admit) 122/72 mmHg   Blood Pressure (Exercise) 158/84 mmHg   Blood Pressure (Exit) 118/72 mmHg   Heart Rate (Admit) 79 bpm   Heart Rate (Exercise) 100 bpm   Heart Rate (Exit) 71 bpm   Rating of Perceived Exertion (Exercise) 14   Symptoms No   Comments Completed post 6MW test. Patient is approaching graduation of the program and home exercise plans were discussed. Details of the patient's exercise prescription and what they need to do in order to continue the prescription and progress with exercise were outlined and the patient verbalized understanding. The patient plans to complete all exercise at the fitness center   Duration Progress to 50 minutes of aerobic without signs/symptoms of physical distress   Intensity Rest + 30   Progression Continue progressive overload as per policy without signs/symptoms or physical distress.   Resistance Training   Training Prescription Yes   Weight 3   Reps 10-15   Interval Training   Interval Training Yes   Equipment REL-XR   Comments L13    Recumbant Elliptical   Level 5  =Bio   Watts 40   Minutes 15   REL-XR   Level 12   Watts 120   Minutes 35   Home Exercise Plan   Plans to continue  exercise at [place]:     6 Minute Walk      08/28/15 1420 11/19/15 1332     6 Minute Walk   Phase Initial Discharge    Distance 1330 feet 1477 feet    Distance % Change  11 %    Walk Time 6 minutes 6 minutes    Resting HR 65 bpm 79 bpm    Resting BP 132/78 mmHg 122/72 mmHg    Max Ex. HR 107 bpm 113 bpm    Max Ex. BP 144/60 mmHg 158/84 mmHg    RPE 13 14    Symptoms Yes (comment) No    Comments L knee pain due to arthritis 5/10 pain        Quality of Life:     Quality of Life - 11/20/15 1420    Quality of Life Scores   Health/Function Post 25.71 %   Health/Function % Change -5 %   Socioeconomic Post 27.43 %   Socioeconomic % Change -1 %   Psych/Spiritual Post 24 %   Psych/Spiritual % Change -20 %   Family Post 22.8 %   Family % Change -9 %   GLOBAL Post 25.27 %   GLOBAL % Change -8 %      Personal Goals: Goals established at orientation with interventions provided to work  toward goal.     Personal Goals and Risk Factors at Admission - 08/28/15 1902    Personal Goals and Risk Factors on Admission    Weight Management Yes   Intervention Learn and follow the exercise and diet guidelines while in the program. Utilize the nutrition and education classes to help gain knowledge of the diet and exercise expectations in the program   Admit Weight 222 lb 8 oz (100.925 kg)   Goal Weight 205 lb (92.987 kg)   Increase Aerobic Exercise and Physical Activity Yes   Intervention While in program, learn and follow the exercise prescription taught. Start at a low level workload and increase workload after able to maintain previous level for 30 minutes. Increase time before increasing intensity.   Diabetes Yes   Goal Blood glucose control identified by blood glucose values, HgbA1C. Participant verbalizes understanding of the signs/symptoms of hyper/hypo glycemia, proper foot care and importance of medication and nutrition plan for blood glucose control.   Intervention Provide nutrition & aerobic exercise along with prescribed medications to achieve blood glucose in normal ranges: Fasting 65-99 mg/dL   Hypertension Yes   Goal Participant will see blood pressure controlled within the values of 140/91mm/Hg or within value directed by their physician.   Intervention Provide nutrition & aerobic exercise along with prescribed medications to achieve BP 140/90 or less.   Lipids Yes   Goal Cholesterol controlled with medications as prescribed, with individualized exercise RX and with personalized nutrition plan. Value goals: LDL < 70mg , HDL > 40mg . Participant states understanding of desired cholesterol values and following prescriptions.   Intervention Provide nutrition & aerobic exercise along with prescribed medications to achieve LDL 70mg , HDL >40mg .   Stress Yes   Goal To meet with psychosocial counselor for stress and relaxation information and guidance. To state understanding of  performing relaxation techniques and or identifying personal stressors.   Intervention Provide education on types of stress, identifiying stressors, and ways to cope with stress. Provide demonstration and active practice of relaxation techniques.       Personal Goals Discharge:     Goals  and Risk Factor Review - 11/21/15 1314    Weight Management   Goals Progress/Improvement seen Yes   Comments Khadir's weight has been stable and the same from day 1 at 222lbs and today was 222lbs.    Increase Aerobic Exercise and Physical Activity   Goals Progress/Improvement seen  Yes   Comments Haygen did level 12 on the recumbent elliptical [id number] today.    Diabetes   Progress seen towards goals Yes   Comments Jaquaveon reports his blood sugars are very stable in the 100 range mostly.    Hypertension   Progress seen toward goals Yes   Comments Blood pressure upon arrival today was 116/68 .   Abnormal Lipids   Progress seen towards goals Yes   Comments Fabian is still taking his statins and will cont to get follow up blood work.       Nutrition & Weight - Outcomes:     Pre Biometrics - 08/28/15 1413    Pre Biometrics   Height 6\' 2"  (1.88 m)   Weight 222 lb 8 oz (100.925 kg)   Waist Circumference 40.25 inches   Hip Circumference 41.5 inches   Waist to Hip Ratio 0.97 %   BMI (Calculated) 28.6         Post Biometrics - 11/19/15 1331     Post  Biometrics   Height 6\' 2"  (1.88 m)   Weight 223 lb 8 oz (101.379 kg)   Waist Circumference 41 inches   Hip Circumference 42 inches   Waist to Hip Ratio 0.98 %   BMI (Calculated) 28.8      Nutrition:     Nutrition Therapy & Goals - 10/01/15 1301    Nutrition Therapy   Diet Instructed on a heart healthy meal plan based on 1900 calories, DASH diet principles as well as diet principles for pre-diabetes   Drug/Food Interactions Statins/Certain Fruits   Fiber 30 grams   Whole Grain Foods 3 servings   Protein 8 ounces/day   Saturated Fats 13 max.  grams   Fruits and Vegetables 5 servings/day   Personal Nutrition Goals   Personal Goal #1 To try some of the low sodium products suggested such as StarKist very low sodium tuna or Fiesta Lime Ms.DASH, or [place] low sodium spaghetti sauce   Personal Goal #2 Read labels for saturated fat, trans fat and sodium.   Personal Goal #3 Increase vegetable/fruit intake to minimum of 5 servings per day.   Personal Goal #4 Balance meals with protein, 2-4 servings of carbohydrate and non-starchy vegetables.      Nutrition Discharge:     Rate Your Plate - [id number]    Rate Your Plate Scores   Post Score 86   Post Score % 95.5 %      Education Questionnaire Score:     Knowledge Questionnaire Score - 11/20/15 1424    Knowledge Questionnaire Score   Post Score 27      Goals reviewed with patient; copy given to patient.

## 2015-11-21 NOTE — Progress Notes (Signed)
Daily Session Note  Patient Details  Name: Tuck Dulworth MRN: [id number] Date of Birth: February 22, 1941 Referring Provider:  Ria Bush, MD  Encounter Date: 11/21/2015  Check In:     Session Check In - 11/21/15 0852    Check-In   Staff Present Gerlene Burdock, RN, BSN;Kendall Caprice Beaver, BS, Exercise Physiologist;Arien Benincasa, BS, ACSM EP-C, Exercise Physiologist   ER physicians immediately available to respond to emergencies See telemetry face sheet for immediately available ER MD   Medication changes reported     No   Fall or balance concerns reported    No   Warm-up and Cool-down Performed on first and last piece of equipment   VAD Patient? No   Pain Assessment   Currently in Pain? No/denies         Goals Met:  Proper associated with RPD/PD & O2 Sat Exercise tolerated well Personal goals reviewed Strength training completed today  Goals Unmet:  Not Applicable  Goals Comments:    Dr. Emily Filbert is Medical Director for [facility].

## 2015-11-21 NOTE — Addendum Note (Signed)
Addended by: Gerlene Burdock on: 11/21/2015 01:22 PM   Modules accepted: Orders

## 2015-11-21 NOTE — Progress Notes (Signed)
Cardiac Individual Treatment Plan  Patient Details  Name: Shannon Chung MRN: [id number] Date of Birth: December 04, 1940 Referring Provider:  Minna Merritts, MD  Initial Encounter Date:    Visit Diagnosis: S/P CABG x 5  Patient's Home Medications on Admission:  Current outpatient prescriptions:  .  acetaminophen (TYLENOL) 500 MG tablet, Take 500 mg by mouth every 6 (six) hours as needed for mild pain. , Disp: , Rfl:  .  aspirin (ASPIRIN EC) 81 MG EC tablet, Take 81 mg by mouth daily. Swallow whole., Disp: , Rfl:  .  Cholecalciferol (VITAMIN D3) 1000 UNITS CAPS, Take 1 capsule (1,000 Units total) by mouth daily., Disp: 30 capsule, Rfl:  .  fluticasone (FLONASE) 50 MCG/ACT nasal spray, Place 1 spray into both nostrils as needed for allergies. , Disp: , Rfl:  .  lansoprazole (PREVACID) 30 MG capsule, Take 1 capsule (30 mg total) by mouth every morning., Disp: 90 capsule, Rfl: 3 .  lisinopril (PRINIVIL,ZESTRIL) 2.5 MG tablet, Take 1 tablet (2.5 mg total) by mouth daily., Disp: 90 tablet, Rfl: 3 .  meloxicam (MOBIC) 15 MG tablet, Take 7.5 mg by mouth once a week. Saturday or sunday, Disp: , Rfl: 1 .  metoprolol succinate (TOPROL-XL) 25 MG 24 hr tablet, Take 1 tablet (25 mg total) by mouth daily., Disp: 90 tablet, Rfl: 3 .  ranitidine (ZANTAC) 150 MG tablet, Take 1 tablet (150 mg total) by mouth at bedtime., Disp: 180 tablet, Rfl: 1 .  simvastatin (ZOCOR) 40 MG tablet, Take 1 tablet (40 mg total) by mouth daily at 6 PM., Disp: 90 tablet, Rfl: 3  Past Medical History: Past Medical History  Diagnosis Date  . Skin cancer     squamous and basal, sees derm regularly  . 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 [place] thrombus of cardiac apex ([place])     a. 06/2014: LV; resolved with coumadin-->no residual on f/u echo, no longer on coumadin.  . Ischemic cardiomyopathy     a. dilated, EF 35% improved to 45-50% (2015);  b. 07/2015 EF 25-35% by LV gram.  . History of radiation exposure      right vocal cord squamous cell cancer  . Essential hypertension   . Dyslipidemia   . 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 isolated episode, not on [place].  Marland Kitchen Vitamin D deficiency   . Coronary artery disease     a. 06/2015 Cardiac CT: Ca score 1103 (84th %'ile);  b. 07/2015 Cath: LM 70, LAD 80p, 100/17m D1 70, D2 95, RI 75, RCA 100p/m;  c. 07/2015 CABG x 5 (LIMA->LAD, VG->Diag, VG->OM1->OM2, VG->OM3).  . Osteoarthritis     a. R-shoulder, L-knee ([place])  . Diabetes mellitus without complication ([place] 90/3500 . Impingement syndrome of right shoulder 10/2015    s/p steroid injection Dr MSabra Heck . 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 Former Smoker -- 0.50 packs/day for 10 years  . Types: Cigarettes  . Quit date: 11/02/1978  Smokeless tobacco  . Never Used    Labs: Recent Review Flowsheet Data    Labs for ITP Cardiac and Pulmonary Rehab Latest Ref Rng 07/29/2015 07/29/2015 07/29/2015 07/29/2015 10/14/2015   Cholestrol 0 - 200 mg/dL - - - - 167   LDLCALC 0 - 99 mg/dL - - - - 80   HDL >39.00 mg/dL - - - -  61.00   Trlycerides 0.0 - 149.0 mg/dL - - - - 126.0   Hemoglobin A1c 4.6 - 6.5 % - - - - 6.3   PHART 7.350 - 7.450 7.379 7.345(L) - 7.333(L) -   PCO2ART 35.0 - 45.0 mmHg 36.4 45.5(H) - 45.2(H) -   HCO3 20.0 - 24.0 mEq/L 21.7 24.8(H) - 24.0 -   TCO2 0 - 100 mmol/L _0 -   ACIDBASEDEF 0.0 - 2.0 mmol/L 3.0(H) 1.0 - 2.0 -   O2SAT - 91.0 95.0 - 96.0 -       Exercise Target Goals:    Exercise Program Goal: Individual exercise prescription set with THRR, safety & activity barriers. Participant demonstrates ability to understand and report RPE using BORG scale, to self-measure pulse accurately, and to acknowledge the importance of the exercise prescription.  Exercise Prescription Goal: Starting with aerobic activity 30 plus minutes a day, 3 days per week for initial exercise prescription.  Provide home exercise prescription and guidelines that participant acknowledges understanding prior to discharge.  Activity Barriers & Risk Stratification:     Activity Barriers & Risk Stratification - 08/28/15 1858    Activity Barriers & Risk Stratification   Risk Stratification High      6 Minute Walk:     6 Minute Walk      08/28/15 1420 11/19/15 1332     6 Minute Walk   Phase Initial Discharge    Distance 1330 feet 1477 feet    Distance % Change  11 %    Walk Time 6 minutes 6 minutes    Resting HR 65 bpm 79 bpm    Resting BP 132/78 mmHg 122/72 mmHg    Max Ex. HR 107 bpm 113 bpm    Max Ex. BP 144/60 mmHg 158/84 mmHg    RPE 13 14    Symptoms Yes (comment) No    Comments L knee pain due to arthritis 5/10 pain        Initial Exercise Prescription:     Initial Exercise Prescription - 08/28/15 1400    Date of Initial Exercise Prescription   Date 08/28/15   Treadmill   MPH 2.2   Grade 0   Minutes 10   Bike   Level 0.4   Minutes 15   Recumbant Bike   Level 3   RPM 40   Watts 30   Minutes 15   NuStep   Level 3   Watts 30   Minutes 15   Arm Ergometer   Level 1   Watts 8   Minutes 10   Arm/Foot Ergometer   Level 4   Watts 12   Minutes 10   Cybex   Level 3   RPM 50   Minutes 10   Recumbant Elliptical   Level 1   RPM 40   Watts 10   Minutes 10   Elliptical   Level 1   Speed 3   Minutes 1   REL-XR   Level 3   Watts 40   Minutes 15   Prescription Details   Frequency (times per week) 3   Duration Progress to 30 minutes of continuous aerobic without signs/symptoms of physical distress   Intensity   THRR REST +  30   Ratings of Perceived Exertion 11-15   Progression Continue progressive overload as per policy without signs/symptoms or physical distress.   Resistance Training   Training Prescription Yes   Weight 2   Reps 10-15  Exercise Prescription Changes:     Exercise Prescription Changes      09/10/15 1100 09/17/15 0900  09/17/15 1622 10/01/15 1000 10/15/15 0900   Exercise Review   Progression No Yes No Yes Yes   Response to Exercise   Blood Pressure (Admit) 110/68 mmHg  122/76 mmHg     Blood Pressure (Exercise) 130/72 mmHg  140/78 mmHg     Blood Pressure (Exit) 106/68 mmHg  120/70 mmHg     Heart Rate (Admit) 71 bpm  83 bpm     Heart Rate (Exercise) 80 bpm  85 bpm     Heart Rate (Exit) 67 bpm  62 bpm     Rating of Perceived Exertion (Exercise) 14  15     Symptoms _0    Comments Patient tolerated exercise well on his first day of class without signs or symptoms. I spoke with patient about exercise prescription and he was able to complete his increases in class today with no signs or symptoms.   Discussed HIIT with Jenny Reichmann, he is going to start with it on Thursday and is excited for another challenge. Reviewed individualized exercise prescription and made increases per departmental policy. Exercise increases were discussed with the patient and they were able to perform the new work loads without issue (no signs or symptoms).  Reviewed individualized exercise prescription and made increases per departmental policy. Exercise increases were discussed with the patient and they were able to perform the new work loads without issue (no signs or symptoms).    Duration Progress to 30 minutes of continuous aerobic without signs/symptoms of physical distress Progress to 30 minutes of continuous aerobic without signs/symptoms of physical distress Progress to 30 minutes of continuous aerobic without signs/symptoms of physical distress Progress to 50 minutes of aerobic without signs/symptoms of physical distress Progress to 50 minutes of aerobic without signs/symptoms of physical distress   Intensity Rest + 30 Rest + 30 Rest + 30 Rest + 30 Rest + 30   Progression Continue progressive overload as per policy without signs/symptoms or physical distress. Continue progressive overload as per policy without signs/symptoms or  physical distress. Continue progressive overload as per policy without signs/symptoms or physical distress. Continue progressive overload as per policy without signs/symptoms or physical distress. Continue progressive overload as per policy without signs/symptoms or physical distress.   Resistance Training   Training Prescription _1    Weight _2 Reps 10-15 10-15 10-15 10-15 10-15   Interval Training   Interval Training No No No Yes Yes   Equipment    REL-XR REL-XR   Recumbant Elliptical   Level 5  =Bio 5  =Bio 5 5  =Bio 5  =Bio   Watts 40 40 40 40 40   Minutes _3 REL-XR   Level _4 Watts 58 80 80 85 120   Minutes _5 35 35     10/31/15 1400 11/07/15 0900 11/19/15 1300       Exercise Review   Progression Yes Yes      Response to Exercise   Blood Pressure (Admit) 120/64 mmHg -- 122/72 mmHg     Blood Pressure (Exercise) 152/80 mmHg -- 158/84 mmHg     Blood Pressure (Exit) 138/80 mmHg -- 118/72 mmHg     Heart Rate (Admit) 77 bpm -- 79 bpm     Heart Rate (Exercise) 81  bpm -- 100 bpm     Heart Rate (Exit) 71 bpm -- 71 bpm     Rating of Perceived Exertion (Exercise) _0 Symptoms No No No     Comments -- Increases were made to interval training and discussed with patient. Increases were comleted today in class with no signs or symptoms.  Completed post 6MW test. Patient is approaching graduation of the program and home exercise plans were discussed. Details of the patient's exercise prescription and what they need to do in order to continue the prescription and progress with exercise were outlined and the patient verbalized understanding. The patient plans to complete all exercise at the fitness center     Duration Progress to 50 minutes of aerobic without signs/symptoms of physical distress Progress to 50 minutes of aerobic without signs/symptoms of physical distress Progress to 50 minutes of aerobic without signs/symptoms of  physical distress     Intensity Rest + 30 Rest + 30 Rest + 30     Progression Continue progressive overload as per policy without signs/symptoms or physical distress. Continue progressive overload as per policy without signs/symptoms or physical distress. Continue progressive overload as per policy without signs/symptoms or physical distress.     Resistance Training   Training Prescription Yes Yes Yes     Weight _1 Reps 10-15 10-15 10-15     Interval Training   Interval Training Yes Yes Yes     Equipment REL-XR REL-XR REL-XR     Comments  L13  L13      Recumbant Elliptical   Level 5  =Bio 5  =Bio 5  =Bio     Watts 40 40 40     Minutes _2 REL-XR   Level _3 Watts 120 120 120     Minutes 35 35 35     Home Exercise Plan   Plans to continue exercise at   [facility]        Discharge Exercise Prescription (Final Exercise Prescription Changes):     Exercise Prescription Changes - 11/19/15 1300    Response to Exercise   Blood Pressure (Admit) 122/72 mmHg   Blood Pressure (Exercise) 158/84 mmHg   Blood Pressure (Exit) 118/72 mmHg   Heart Rate (Admit) 79 bpm   Heart Rate (Exercise) 100 bpm   Heart Rate (Exit) 71 bpm   Rating of Perceived Exertion (Exercise) 14   Symptoms No   Comments Completed post 6MW test. Patient is approaching graduation of the program and home exercise plans were discussed. Details of the patient's exercise prescription and what they need to do in order to continue the prescription and progress with exercise were outlined and the patient verbalized understanding. The patient plans to complete all exercise at the fitness center   Duration Progress to 50 minutes of aerobic without signs/symptoms of physical distress   Intensity Rest + 30   Progression Continue progressive overload as per policy without signs/symptoms or physical distress.   Resistance Training   Training Prescription Yes   Weight 3   Reps 10-15   Interval  Training   Interval Training Yes   Equipment REL-XR   Comments L13    Recumbant Elliptical   Level 5  =Bio   Watts 40   Minutes 15   REL-XR   Level 12   Watts 120   Minutes 35  Home Exercise Plan   Plans to continue exercise at [facility]      Nutrition:  Target Goals: Understanding of nutrition guidelines, daily intake of sodium '1500mg'$ , cholesterol '200mg'$ , calories 30% from fat and 7% or less from saturated fats, daily to have 5 or more servings of fruits and vegetables.  Biometrics:     Pre Biometrics - 08/28/15 1413    Pre Biometrics   Height '6\' 2"'$  (1.88 m)   Weight 222 lb 8 oz (100.925 kg)   Waist Circumference 40.25 inches   Hip Circumference 41.5 inches   Waist to Hip Ratio 0.97 %   BMI (Calculated) 28.6         Post Biometrics - 11/19/15 1331     Post  Biometrics   Height '6\' 2"'$  (1.88 m)   Weight 223 lb 8 oz (101.379 kg)   Waist Circumference 41 inches   Hip Circumference 42 inches   Waist to Hip Ratio 0.98 %   BMI (Calculated) 28.8      Nutrition Therapy Plan and Nutrition Goals:     Nutrition Therapy & Goals - 10/01/15 1301    Nutrition Therapy   Diet Instructed on a heart healthy meal plan based on 1900 calories, DASH diet principles as well as diet principles for pre-diabetes   Drug/Food Interactions Statins/Certain Fruits   Fiber 30 grams   Whole Grain Foods 3 servings   Protein 8 ounces/day   Saturated Fats 13 max. grams   Fruits and Vegetables 5 servings/day   Personal Nutrition Goals   Personal Goal #1 To try some of the low sodium products suggested such as StarKist very low sodium tuna or Fiesta Lime Ms.DASH, or [place] low sodium spaghetti sauce   Personal Goal #2 Read labels for saturated fat, trans fat and sodium.   Personal Goal #3 Increase vegetable/fruit intake to minimum of 5 servings per day.   Personal Goal #4 Balance meals with protein, 2-4 servings of carbohydrate and non-starchy vegetables.      Nutrition  Discharge: Rate Your Plate Scores:     Rate Your Plate - 67/61/95 0932    Rate Your Plate Scores   Post Score 86   Post Score % 95.5 %      Nutrition Goals Re-Evaluation:     Nutrition Goals Re-Evaluation      09/24/15 1002 10/24/15 1032 10/31/15 1647 11/21/15 1313     Personal Goal #1 Re-Evaluation   Personal Goal #1 Lower salt       Goal Progress Seen Yes Yes (p) Yes Yes    Comments Macon reports that he and his wife are label readers now after his surgery and they have cut out all salt. Boone reports they eat alot of fish and practically no red meat. I made him a Cardiac Rehab registered dietician appt.  Bookert reports his sodium intake is much lower.   Bayani has tried Ms. Dash.     Personal Goal #2 Re-Evaluation   Personal Goal #2    Nethaniel reads the labels ahd said he is interested in health information since he wanted to be a physician.     Goal Progress Seen   (p) Yes Yes    Personal Goal #3 Re-Evaluation   Personal Goal #3    Garry has increased his vegetables and is working on increasing his fruit intake also.     Goal Progress Seen  Yes  Yes    Personal Goal #4 Re-Evaluation   Goal Progress  Seen  Yes  Yes       Psychosocial: Target Goals: Acknowledge presence or absence of depression, maximize coping skills, provide positive support system. Participant is able to verbalize types and ability to use techniques and skills needed for reducing stress and depression.  Initial Review & Psychosocial Screening:     Initial Psych Review & Screening - 08/28/15 1903    Family Dynamics   Good Support System? Yes   Comments Patient has fiancee, Janyce Llanos; two daughters and 5 grandchildren.  Patient is affliated with [facility].     Barriers   Psychosocial barriers to participate in program There are no identifiable barriers or psychosocial needs.  Patient stated one year ago is was really stressed when his daughter had cancer and he was helping take care of his grandchildren.  He  was worried about his daughter.  She is cancer free at this time.     Screening Interventions   Interventions Encouraged to exercise      Quality of Life Scores:     Quality of Life - 11/20/15 1420    Quality of Life Scores   Health/Function Post 25.71 %   Health/Function % Change -5 %   Socioeconomic Post 27.43 %   Socioeconomic % Change -1 %   Psych/Spiritual Post 24 %   Psych/Spiritual % Change -20 %   Family Post 22.8 %   Family % Change -9 %   GLOBAL Post 25.27 %   GLOBAL % Change -8 %      PHQ-9:     Recent Review Flowsheet Data    Depression screen [facility] 2/9 11/20/2015 10/21/2015 08/28/2015   Decreased Interest 1 0 0   Down, Depressed, Hopeless 1 0 0   PHQ - 2 Score 2 0 0   Altered sleeping 0 - 1   Tired, decreased energy 1 - 1   Change in appetite 0 - 0   Feeling bad or failure about yourself  0 - 0   Trouble concentrating 0 - 0   Moving slowly or fidgety/restless 0 - 0   Suicidal thoughts 0 - 0   PHQ-9 Score 3 - 2   Difficult doing work/chores Somewhat difficult - Not difficult at all      Psychosocial Evaluation and Intervention:     Psychosocial Evaluation - 10/01/15 0935    Psychosocial Evaluation & Interventions   Interventions Stress management education;Relaxation education;Encouraged to exercise with the program and follow exercise prescription   Comments Counselor met with Mr. Coble today (Mr. Kathie Rhodes) for the initial psychosocial evaluation.  He is a 75 year old who reports having open heart surgery 9 weeks ago.  He states he has a strong support system with a 23 year significant other and several adult daughters out of state.  Mr. Kathie Rhodes is awaiting knee replacement surgery once he recovers enough.  He also has arthritis and borderline diabetes.  He reports he sleeps well and has a good appetite.  He denies a history of depression or anxiety but recognizes that his younger daughter has been battling cancer for 5 years and this has been especially stressful  for him.  Mr. Kathie Rhodes states his mood is "okay" currently having recently moved to [facility] and this has been stressful as well.  His Thanksgiving weekend with couples from  [place] was also "awkward" with everyone concerned about Mr. Loni Muse health and this bothered him.  His goals are to lose weight and increase his stamina and strength in this  program.  He states he is already experiencing some of the benefits in feeling stronger since beginning.  Counselor recommended Mr. Chauncey Cruel meet with the dietician to address his weight loss goals and he will also benefit from all of the psychoeducational components of this program, especially stress management.        Psychosocial Re-Evaluation:     Psychosocial Re-Evaluation      09/24/15 1017 10/24/15 1034 11/21/15 1317       Psychosocial Re-Evaluation   Interventions Encouraged to attend Cardiac Rehabilitation for the exercise Encouraged to attend Cardiac Rehabilitation for the exercise      Comments Hameed says Cardiac REhab has increased his strength every week.   Nimrod found out a month after he filled out his quality of life pre survey that his 53 year old daughters' lymphoma reoccured. Orlandis is going to [place] to help take care of his 73 year old daugher and her two teenage boys this coming week for at least one week. "I thought they would be needing to take care of me but I am going to go take care of them".         Vocational Rehabilitation: Provide vocational rehab assistance to qualifying candidates.   Vocational Rehab Evaluation & Intervention:     Vocational Rehab - 08/28/15 1900    Initial Vocational Rehab Evaluation & Intervention   Assessment shows need for Vocational Rehabilitation No      Education: Education Goals: Education classes will be provided on a weekly basis, covering required topics. Participant will state understanding/return demonstration of topics presented.  Learning Barriers/Preferences:     Learning Barriers/Preferences -  08/28/15 1858    Learning Barriers/Preferences   Learning Barriers Sight;Exercise Concerns   Learning Preferences Written [place]      Education Topics: General Nutrition Guidelines/Fats and Fiber: -Group instruction provided by verbal, written material, models and posters to present the general guidelines for heart healthy nutrition. Gives an explanation and review of dietary fats and fiber.          Cardiac Rehab from 11/21/2015 in [facility] Cardiac and Pulmonary Rehab   Date  10/08/15   Educator  Erlene Quan   Instruction Review Code  2- meets goals/outcomes      Controlling Sodium/Reading Food Labels: -Group verbal and written material supporting the discussion of sodium use in heart healthy nutrition. Review and explanation with models, verbal and written materials for utilization of the food label.      Cardiac Rehab from 11/21/2015 in [facility] Cardiac and Pulmonary Rehab   Date  10/15/15   Educator  CR   Instruction Review Code  2- meets goals/outcomes      Exercise Physiology & Risk Factors: - Group verbal and written instruction with models to review the exercise physiology of the cardiovascular system and associated critical values. Details cardiovascular disease risk factors and the goals associated with each risk factor.      Cardiac Rehab from 11/21/2015 in [facility] Cardiac and Pulmonary Rehab   Date  11/07/15   Educator  SW   Instruction Review Code  2- meets goals/outcomes      Aerobic Exercise & Resistance Training: - Gives group verbal and written discussion on the health impact of inactivity. On the components of aerobic and resistive training programs and the benefits of this training and how to safely progress through these programs.      Cardiac Rehab from 11/21/2015 in [facility] Cardiac and Pulmonary Rehab   Date  11/19/15  Educator  RM   Instruction Review Code  2- meets goals/outcomes      Flexibility, Balance, General Exercise Guidelines: - Provides group  verbal and written instruction on the benefits of flexibility and balance training programs. Provides general exercise guidelines with specific guidelines to those with heart or lung disease. Demonstration and skill practice provided.      Cardiac Rehab from 11/21/2015 in [facility] Cardiac and Pulmonary Rehab   Date  11/19/15   Educator  RM   Instruction Review Code  2- meets goals/outcomes      Stress Management: - Provides group verbal and written instruction about the health risks of elevated stress, cause of high stress, and healthy ways to reduce stress.      Cardiac Rehab from 11/21/2015 in [facility] Cardiac and Pulmonary Rehab   Date  11/14/15   Educator  CE   Instruction Review Code  2- meets goals/outcomes      Depression: - Provides group verbal and written instruction on the correlation between heart/lung disease and depressed mood, treatment options, and the stigmas associated with seeking treatment.      Cardiac Rehab from 11/21/2015 in [facility] Cardiac and Pulmonary Rehab   Date  10/17/15   Educator  CE   Instruction Review Code  2- meets goals/outcomes      Anatomy & Physiology of the Heart: - Group verbal and written instruction and models provide basic cardiac anatomy and physiology, with the coronary electrical and arterial systems. Review of: AMI, Angina, Valve disease, Heart Failure, Cardiac Arrhythmia, Pacemakers, and the ICD.      Cardiac Rehab from 11/21/2015 in [facility] Cardiac and Pulmonary Rehab   Date  11/21/15   Educator  CE   Instruction Review Code  2- meets goals/outcomes      Cardiac Procedures: - Group verbal and written instruction and models to describe the testing methods done to diagnose heart disease. Reviews the outcomes of the test results. Describes the treatment choices: Medical Management, Angioplasty, or Coronary Bypass Surgery.      Cardiac Rehab from 11/21/2015 in [facility] Cardiac and Pulmonary Rehab   Date  10/22/15   Educator  DW   Instruction Review Code   2- meets goals/outcomes      Cardiac Medications: - Group verbal and written instruction to review commonly prescribed medications for heart disease. Reviews the medication, class of the drug, and side effects. Includes the steps to properly store meds and maintain the prescription regimen.      Cardiac Rehab from 11/21/2015 in [facility] Cardiac and Pulmonary Rehab   Date  10/31/15   Educator  CE   Instruction Review Code  2- meets goals/outcomes      Go Sex-Intimacy & Heart Disease, Get SMART - Goal Setting: - Group verbal and written instruction through game format to discuss heart disease and the return to sexual intimacy. Provides group verbal and written material to discuss and apply goal setting through the application of the S.M.A.R.T. Method.      Cardiac Rehab from 11/21/2015 in [facility] Cardiac and Pulmonary Rehab   Date  10/22/15   Educator  DW   Instruction Review Code  2- meets goals/outcomes      Other Matters of the Heart: - Provides group verbal, written materials and models to describe Heart Failure, Angina, Valve Disease, and Diabetes in the realm of heart disease. Includes description of the disease process and treatment options available to the cardiac patient.      Cardiac Rehab from 11/21/2015  in [facility] Cardiac and Pulmonary Rehab   Date  10/03/15   Educator  CE   Instruction Review Code  2- meets goals/outcomes      Exercise & Equipment Safety: - Individual verbal instruction and demonstration of equipment use and safety with use of the equipment.      Cardiac Rehab from 11/21/2015 in [facility] Cardiac and Pulmonary Rehab   Date  08/28/15   Educator  DW   Instruction Review Code  1- partially meets, needs review/practice      Infection Prevention: - Provides verbal and written material to individual with discussion of infection control including proper hand washing and proper equipment cleaning during exercise session.      Cardiac Rehab from 11/21/2015 in [facility] Cardiac and  Pulmonary Rehab   Date  08/28/15   Educator  DW   Instruction Review Code  2- meets goals/outcomes      Falls Prevention: - Provides verbal and written material to individual with discussion of falls prevention and safety.      Cardiac Rehab from 11/21/2015 in [facility] Cardiac and Pulmonary Rehab   Date  08/28/15   Educator  DW   Instruction Review Code  2- meets goals/outcomes      Diabetes: - Individual verbal and written instruction to review signs/symptoms of diabetes, desired ranges of glucose level fasting, after meals and with exercise. Advice that pre and post exercise glucose checks will be done for 3 sessions at entry of program.      Cardiac Rehab from 11/21/2015 in [facility] Cardiac and Pulmonary Rehab   Date  08/28/15   Educator  DW   Instruction Review Code  2- meets goals/outcomes       Knowledge Questionnaire Score:     Knowledge Questionnaire Score - 11/20/15 1424    Knowledge Questionnaire Score   Post Score 27      Personal Goals and Risk Factors at Admission:     Personal Goals and Risk Factors at Admission - 08/28/15 1902    Personal Goals and Risk Factors on Admission    Weight Management Yes   Intervention Learn and follow the exercise and diet guidelines while in the program. Utilize the nutrition and education classes to help gain knowledge of the diet and exercise expectations in the program   Admit Weight 222 lb 8 oz (100.925 kg)   Goal Weight 205 lb (92.987 kg)   Increase Aerobic Exercise and Physical Activity Yes   Intervention While in program, learn and follow the exercise prescription taught. Start at a low level workload and increase workload after able to maintain previous level for 30 minutes. Increase time before increasing intensity.   Diabetes Yes   Goal Blood glucose control identified by blood glucose values, HgbA1C. Participant verbalizes understanding of the signs/symptoms of hyper/hypo glycemia, proper foot care and importance of  medication and nutrition plan for blood glucose control.   Intervention Provide nutrition & aerobic exercise along with prescribed medications to achieve blood glucose in normal ranges: Fasting 65-99 mg/dL   Hypertension Yes   Goal Participant will see blood pressure controlled within the values of 140/57m/Hg or within value directed by their physician.   Intervention Provide nutrition & aerobic exercise along with prescribed medications to achieve BP 140/90 or less.   Lipids Yes   Goal Cholesterol controlled with medications as prescribed, with individualized exercise RX and with personalized nutrition plan. Value goals: LDL < 796m HDL > 408mParticipant states understanding of desired cholesterol values and  following prescriptions.   Intervention Provide nutrition & aerobic exercise along with prescribed medications to achieve LDL <29m, HDL >461m   Stress Yes   Goal To meet with psychosocial counselor for stress and relaxation information and guidance. To state understanding of performing relaxation techniques and or identifying personal stressors.   Intervention Provide education on types of stress, identifiying stressors, and ways to cope with stress. Provide demonstration and active practice of relaxation techniques.      Personal Goals and Risk Factors Review:      Goals and Risk Factor Review      09/11/15 0909 09/24/15 1014 09/24/15 1020 10/24/15 1032 10/30/15 0853   Weight Management   Goals Progress/Improvement seen   Yes     Comments   Bergen said before his surgery his weight was 236 lbs then after surgery he was 226lbs and now 220 lbs but he wants to lose 10 more lbs.      Increase Aerobic Exercise and Physical Activity   Goals Progress/Improvement seen  Yes Yes  Yes Yes   Comments JoMuzamilas increased his Cardiac Rehab exercise workloads.  Sohrab reports that he has been 9 weeks out of surgery and he feels like he is getting stronger every week. Jkwon reports he is getting around  better especially in a store.    JoMicheleays he continues to feel good and feels confident exerting himself with exercise. He has been very impressed with the increase in his exercise capacity through progression. He can continuously exercise for the entire class time and has made intensity increases on the treadmill and XR machine. He has been encouraged to exercise outside of class and we will follow up with him next month on the details of his home exercise routine.    Diabetes   Goal --  Stable blood sugars for Abhijay.  --  Stable blood sugars.       Progress seen towards goals    Yes    Comments    Trashaun reports his blood sugar levels are good.     Hypertension   Goal --  Laithan's blood pressure has been good.  --  JoJenny Reichmanns blood pressure has been very good in Cardiac Rehb.       Progress seen toward goals    Yes    Comments    Stable blood pressure    Abnormal Lipids   Goal  --  JoTommieill get his lipid blood work rechecked by his MD in the future.       Progress seen towards goals    Unknown    Stress   Goal  --  Johns stress has decreased since starting Cardiac Rehab.       Progress seen towards goals    Yes    Comments    exercise is helping.      11/21/15 1011 11/21/15 1314         Weight Management   Goals Progress/Improvement seen Yes Yes      Comments Maiintaining weight well. Keaten's weight has been stable and the same from day 1 at 222lbs and today was 222lbs.       Increase Aerobic Exercise and Physical Activity   Goals Progress/Improvement seen  Yes Yes      Comments Feels in better shape overall.  Joining our independent gym to continue exercise. Jasen did level 12 on the recumbent elliptical XR6000 today.       Diabetes   Progress seen towards  goals  Yes      Comments  Daymion reports his blood sugars are very stable in the 100 range mostly.       Hypertension   Progress seen toward goals Yes Yes      Comments Maintaining acceptable ranges during class. Blood pressure upon  arrival today was 116/68 .      Abnormal Lipids   Goal Cholesterol controlled with medications as prescribed, with individualized exercise RX and with personalized nutrition plan. Value goals: LDL < 83m, HDL > 435m Participant states understanding of desired cholesterol values and following prescriptions.       Progress seen towards goals Yes Yes      Comments Trying to do all he can to maintain levels accetably, and ulitimately strive to control with only diet and exercise. JoAkshs still taking his statins and will cont to get follow up blood work.       Stress   Goal To meet with psychosocial counselor for stress and relaxation information and guidance. To state understanding of performing relaxation techniques and or identifying personal stressors.          Personal Goals Discharge (Final Personal Goals and Risk Factors Review):      Goals and Risk Factor Review - 11/21/15 1314    Weight Management   Goals Progress/Improvement seen Yes   Comments Fausto's weight has been stable and the same from day 1 at 222lbs and today was 222lbs.    Increase Aerobic Exercise and Physical Activity   Goals Progress/Improvement seen  Yes   Comments Duard did level 12 on the recumbent elliptical XR6000 today.    Diabetes   Progress seen towards goals Yes   Comments Sandro reports his blood sugars are very stable in the 100 range mostly.    Hypertension   Progress seen toward goals Yes   Comments Blood pressure upon arrival today was 116/68 .   Abnormal Lipids   Progress seen towards goals Yes   Comments JoNeekos still taking his statins and will cont to get follow up blood work.       ITP Comments:     ITP Comments      10/08/15 1332 11/03/15 1322         ITP Comments 30 day review preparation  Continue with ITP Ready for 30 day review.  Continue with ITP         Comments: JoDantres some stressors since his daughter is receiving the "highest gun chemo for her cancer that has reoccured. Santino  said he wanted to be a physician so he has learned a lot from our Cardiac rehab education sessions.

## 2015-11-22 NOTE — Progress Notes (Signed)
Discharge Summary  Patient Details  Name: Shannon Chung MRN: [id number] Date of Birth: 1941/05/10 Referring Provider:  Minna Merritts, MD   Number of Visits: 52  Reason for Discharge:  Patient reached a stable level of exercise. Patient independent in their exercise.  Smoking History:  History  Smoking status  . Former Smoker -- 0.50 packs/day for 10 years  . Types: Cigarettes  . Quit date: 11/02/1978  Smokeless tobacco  . Never Used    Diagnosis:  S/P CABG x 5 - Plan: CARDIAC REHAB 30 DAY REVIEW  ADL UCSD:   Initial Exercise Prescription:     Initial Exercise Prescription - 08/28/15 1400    Date of Initial Exercise Prescription   Date 08/28/15   Treadmill   MPH 2.2   Grade 0   Minutes 10   Bike   Level 0.4   Minutes 15   Recumbant Bike   Level 3   RPM 40   Watts 30   Minutes 15   NuStep   Level 3   Watts 30   Minutes 15   Arm Ergometer   Level 1   Watts 8   Minutes 10   Arm/Foot Ergometer   Level 4   Watts 12   Minutes 10   Cybex   Level 3   RPM 50   Minutes 10   Recumbant Elliptical   Level 1   RPM 40   Watts 10   Minutes 10   Elliptical   Level 1   Speed 3   Minutes 1   REL-XR   Level 3   Watts 40   Minutes 15   Prescription Details   Frequency (times per week) 3   Duration Progress to 30 minutes of continuous aerobic without signs/symptoms of physical distress   Intensity   THRR REST +  30   Ratings of Perceived Exertion 11-15   Progression Continue progressive overload as per policy without signs/symptoms or physical distress.   Resistance Training   Training Prescription Yes   Weight 2   Reps 10-15      Discharge Exercise Prescription (Final Exercise Prescription Changes):     Exercise Prescription Changes - 11/19/15 1300    Response to Exercise   Blood Pressure (Admit) 122/72 mmHg   Blood Pressure (Exercise) 158/84 mmHg   Blood Pressure (Exit) 118/72 mmHg   Heart Rate (Admit) 79 bpm   Heart Rate (Exercise)  100 bpm   Heart Rate (Exit) 71 bpm   Rating of Perceived Exertion (Exercise) 14   Symptoms No   Comments Completed post 6MW test. Patient is approaching graduation of the program and home exercise plans were discussed. Details of the patient's exercise prescription and what they need to do in order to continue the prescription and progress with exercise were outlined and the patient verbalized understanding. The patient plans to complete all exercise at the fitness center   Duration Progress to 50 minutes of aerobic without signs/symptoms of physical distress   Intensity Rest + 30   Progression Continue progressive overload as per policy without signs/symptoms or physical distress.   Resistance Training   Training Prescription Yes   Weight 3   Reps 10-15   Interval Training   Interval Training Yes   Equipment REL-XR   Comments L13    Recumbant Elliptical   Level 5  =Bio   Watts 40   Minutes 15   REL-XR   Level 12   Watts 120  Minutes 35   Louise to continue exercise at [place]:     6 Minute Walk      08/28/15 1420 11/19/15 1332     6 Minute Walk   Phase Initial Discharge    Distance 1330 feet 1477 feet    Distance % Change  11 %    Walk Time 6 minutes 6 minutes    Resting HR 65 bpm 79 bpm    Resting BP 132/78 mmHg 122/72 mmHg    Max Ex. HR 107 bpm 113 bpm    Max Ex. BP 144/60 mmHg 158/84 mmHg    RPE 13 14    Symptoms Yes (comment) No    Comments L knee pain due to arthritis 5/10 pain        Psychological, QOL, Others - Outcomes: PHQ 2/9: Depression screen [facility] 2/9 11/20/2015 10/21/2015 08/28/2015  Decreased Interest 1 0 0  Down, Depressed, Hopeless 1 0 0  PHQ - 2 Score 2 0 0  Altered sleeping 0 - 1  Tired, decreased energy 1 - 1  Change in appetite 0 - 0  Feeling bad or failure about yourself  0 - 0  Trouble concentrating 0 - 0  Moving slowly or fidgety/restless 0 - 0  Suicidal thoughts 0 - 0   PHQ-9 Score 3 - 2  Difficult doing work/chores Somewhat difficult - Not difficult at all    Quality of Life:     Quality of Life - 11/20/15 1420    Quality of Life Scores   Health/Function Post 25.71 %   Health/Function % Change -5 %   Socioeconomic Post 27.43 %   Socioeconomic % Change -1 %   Psych/Spiritual Post 24 %   Psych/Spiritual % Change -20 %   Family Post 22.8 %   Family % Change -9 %   GLOBAL Post 25.27 %   GLOBAL % Change -8 %      Personal Goals: Goals established at orientation with interventions provided to work toward goal.     Personal Goals and Risk Factors at Admission - 08/28/15 1902    Personal Goals and Risk Factors on Admission    Weight Management Yes   Intervention Learn and follow the exercise and diet guidelines while in the program. Utilize the nutrition and education classes to help gain knowledge of the diet and exercise expectations in the program   Admit Weight 222 lb 8 oz (100.925 kg)   Goal Weight 205 lb (92.987 kg)   Increase Aerobic Exercise and Physical Activity Yes   Intervention While in program, learn and follow the exercise prescription taught. Start at a low level workload and increase workload after able to maintain previous level for 30 minutes. Increase time before increasing intensity.   Diabetes Yes   Goal Blood glucose control identified by blood glucose values, HgbA1C. Participant verbalizes understanding of the signs/symptoms of hyper/hypo glycemia, proper foot care and importance of medication and nutrition plan for blood glucose control.   Intervention Provide nutrition & aerobic exercise along with prescribed medications to achieve blood glucose in normal ranges: Fasting 65-99 mg/dL   Hypertension Yes   Goal Participant will see blood pressure controlled within the values of 140/67mm/Hg or within value directed by their physician.   Intervention Provide nutrition & aerobic exercise along with prescribed medications to  achieve BP 140/90 or less.   Lipids Yes   Goal Cholesterol controlled with  medications as prescribed, with individualized exercise RX and with personalized nutrition plan. Value goals: LDL < 70mg , HDL > 40mg . Participant states understanding of desired cholesterol values and following prescriptions.   Intervention Provide nutrition & aerobic exercise along with prescribed medications to achieve LDL 70mg , HDL >40mg .   Stress Yes   Goal To meet with psychosocial counselor for stress and relaxation information and guidance. To state understanding of performing relaxation techniques and or identifying personal stressors.   Intervention Provide education on types of stress, identifiying stressors, and ways to cope with stress. Provide demonstration and active practice of relaxation techniques.       Personal Goals Discharge:     Goals and Risk Factor Review      09/11/15 0909 09/24/15 1014 09/24/15 1020 10/24/15 1032 10/30/15 0853   Weight Management   Goals Progress/Improvement seen   Yes     Comments   Adhvik said before his surgery his weight was 236 lbs then after surgery he was 226lbs and now 220 lbs but he wants to lose 10 more lbs.      Increase Aerobic Exercise and Physical Activity   Goals Progress/Improvement seen  Yes Yes  Yes Yes   Comments Brecker has increased his Cardiac Rehab exercise workloads.  Susie reports that he has been 9 weeks out of surgery and he feels like he is getting stronger every week. Zaccary reports he is getting around better especially in a store.    Randon says he continues to feel good and feels confident exerting himself with exercise. He has been very impressed with the increase in his exercise capacity through progression. He can continuously exercise for the entire class time and has made intensity increases on the treadmill and XR machine. He has been encouraged to exercise outside of class and we will follow up with him next month on the details of his home exercise  routine.    Diabetes   Goal --  Stable blood sugars for Nassim.  --  Stable blood sugars.       Progress seen towards goals    Yes    Comments    Yuri reports his blood sugar levels are good.     Hypertension   Goal --  Bartolo's blood pressure has been good.  --  Jenny Reichmann 's blood pressure has been very good in Cardiac Rehb.       Progress seen toward goals    Yes    Comments    Stable blood pressure    Abnormal Lipids   Goal  --  Vishwa will get his lipid blood work rechecked by his MD in the future.       Progress seen towards goals    Unknown    Stress   Goal  --  Johns stress has decreased since starting Cardiac Rehab.       Progress seen towards goals    Yes    Comments    exercise is helping.      11/21/15 1011 11/21/15 1314         Weight Management   Goals Progress/Improvement seen Yes Yes      Comments Maiintaining weight well. Jonah's weight has been stable and the same from day 1 at 222lbs and today was 222lbs.       Increase Aerobic Exercise and Physical Activity   Goals Progress/Improvement seen  Yes Yes      Comments Feels in better shape overall.  Joining our independent gym  to continue exercise. Andrey did level 12 on the recumbent elliptical XR6000 today.       Diabetes   Progress seen towards goals  Yes      Comments  Ledell reports his blood sugars are very stable in the 100 range mostly.       Hypertension   Progress seen toward goals Yes Yes      Comments Maintaining acceptable ranges during class. Blood pressure upon arrival today was 116/68 .      Abnormal Lipids   Goal Cholesterol controlled with medications as prescribed, with individualized exercise RX and with personalized nutrition plan. Value goals: LDL < 70mg , HDL > 40mg . Participant states understanding of desired cholesterol values and following prescriptions.       Progress seen towards goals Yes Yes      Comments Trying to do all he can to maintain levels accetably, and ulitimately strive to control with  only diet and exercise. Abdulazeez is still taking his statins and will cont to get follow up blood work.       Stress   Goal To meet with psychosocial counselor for stress and relaxation information and guidance. To state understanding of performing relaxation techniques and or identifying personal stressors.          Nutrition & Weight - Outcomes:     Pre Biometrics - 08/28/15 1413    Pre Biometrics   Height 6\' 2"  (1.88 m)   Weight 222 lb 8 oz (100.925 kg)   Waist Circumference 40.25 inches   Hip Circumference 41.5 inches   Waist to Hip Ratio 0.97 %   BMI (Calculated) 28.6         Post Biometrics - 11/19/15 1331     Post  Biometrics   Height 6\' 2"  (1.88 m)   Weight 223 lb 8 oz (101.379 kg)   Waist Circumference 41 inches   Hip Circumference 42 inches   Waist to Hip Ratio 0.98 %   BMI (Calculated) 28.8      Nutrition:     Nutrition Therapy & Goals - 10/01/15 1301    Nutrition Therapy   Diet Instructed on a heart healthy meal plan based on 1900 calories, DASH diet principles as well as diet principles for pre-diabetes   Drug/Food Interactions Statins/Certain Fruits   Fiber 30 grams   Whole Grain Foods 3 servings   Protein 8 ounces/day   Saturated Fats 13 max. grams   Fruits and Vegetables 5 servings/day   Personal Nutrition Goals   Personal Goal #1 To try some of the low sodium products suggested such as StarKist very low sodium tuna or Fiesta Lime Ms.DASH, or [place] low sodium spaghetti sauce   Personal Goal #2 Read labels for saturated fat, trans fat and sodium.   Personal Goal #3 Increase vegetable/fruit intake to minimum of 5 servings per day.   Personal Goal #4 Balance meals with protein, 2-4 servings of carbohydrate and non-starchy vegetables.      Nutrition Discharge:     Rate Your Plate - [id number]    Rate Your Plate Scores   Post Score 86   Post Score % 95.5 %      Education Questionnaire Score:     Knowledge Questionnaire Score - 11/20/15  1424    Knowledge Questionnaire Score   Post Score 27      Goals reviewed with patient; copy given to patient.

## 2015-11-26 ENCOUNTER — Ambulatory Visit: Payer: Medicare Other | Admitting: Cardiovascular Disease

## 2015-11-27 ENCOUNTER — Ambulatory Visit: Payer: Self-pay | Admitting: Cardiovascular Disease

## 2015-11-27 DIAGNOSIS — I513 Intracardiac thrombosis, not elsewhere classified: Secondary | ICD-10-CM

## 2015-11-28 ENCOUNTER — Telehealth: Payer: Self-pay

## 2015-11-28 NOTE — Telephone Encounter (Signed)
Received clearance request for pt to proceed w/ [employer] exercise program, as he wants to continue exercising after graduating from cardiac rehab on 11/21/15. Per Dr. Rockey Situ, pt is cleared to proceed.  Faxed to [phone].

## 2015-12-16 ENCOUNTER — Ambulatory Visit: Payer: Medicare Other | Admitting: Cardiovascular Disease

## 2015-12-19 ENCOUNTER — Encounter: Payer: Self-pay | Admitting: Physician Assistant

## 2015-12-19 ENCOUNTER — Ambulatory Visit (INDEPENDENT_AMBULATORY_CARE_PROVIDER_SITE_OTHER): Payer: Medicare Other | Admitting: Physician Assistant

## 2015-12-19 VITALS — BP 138/70 | HR 74 | Ht 73.0 in | Wt 225.2 lb

## 2015-12-19 DIAGNOSIS — I255 Ischemic cardiomyopathy: Secondary | ICD-10-CM

## 2015-12-19 DIAGNOSIS — E785 Hyperlipidemia, unspecified: Secondary | ICD-10-CM

## 2015-12-19 DIAGNOSIS — I10 Essential (primary) hypertension: Secondary | ICD-10-CM

## 2015-12-19 DIAGNOSIS — I2510 Atherosclerotic heart disease of native coronary artery without angina pectoris: Secondary | ICD-10-CM

## 2015-12-19 DIAGNOSIS — Z951 Presence of aortocoronary bypass graft: Secondary | ICD-10-CM

## 2015-12-19 DIAGNOSIS — Z79899 Other long term (current) drug therapy: Secondary | ICD-10-CM | POA: Diagnosis not present

## 2015-12-19 MED ORDER — ATORVASTATIN CALCIUM 40 MG PO TABS: 40.0000 mg | ORAL_TABLET | Freq: Every day | ORAL | Status: DC

## 2015-12-19 NOTE — Progress Notes (Signed)
Cardiology Office Note Date:  12/19/2015  Patient ID:  Shannon Chung, Shannon Chung 11/16/1940, MRN [id number] PCP:  Ria Bush, MD  Cardiologist:  Dr. Rockey Situ, MD    Chief Complaint: Follow up for CAD  History of Present Illness: Shannon Chung is a 75 y.o. male with history of CAD s/p 5v CABG in 07/2015, ischemic cardiomyopathy, HTN, and HLD who presents for routine follow up. He was diagnosed with cardiomyopathy of 35% in 05/2014 and LV mural thrombus requiring Coumadin anticoagulation in 2015. Apparently did not have diagnostic catheterization to determine the source of his cardiomyopathy. He was doing well and was not having any chest pain and saw Dr. Rockey Situ in August of this year. Cardiac CT was performed revealing a calcium score of 1103. Most of the calcium was in the LAD territory. Catheterization was subsequently performed revealing severe multivessel coronary artery disease involving the left main, LAD, diagonals, ramus, and right coronary artery. He was seen by thoracic surgery and subsequently underwent coronary artery bypass grafting 5 on September 26. He had an uneventful hospital course and was subsequently discharged. In follow up in November he was doing remarkably well. He has been working with cardiac rehab. Follow up echo 11/2015 showed normalization of EF to 55-60%, no RWMA, GR1DD, left atrium was mildly dilated.    He continues to do well. He has graduated from cardiac rehab and has started exercising with [employer]. His continues to note strong energy without any drop off. No chest pain or SOB. He is tolerating all of his medications without issues. He continues to eat an improved, healthy diet. No orthopnea, lower extremity edema, or early satiety.    Past Medical History  Diagnosis Date  . Skin cancer     squamous and basal, sees derm regularly  . 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 Mural thrombus of cardiac apex ([place])     a. 06/2014: LV;  resolved with coumadin-->no residual on f/u echo, no longer on coumadin.  . Ischemic cardiomyopathy     a. dilated, EF 35% improved to 45-50% (2015);  b. 07/2015 EF 25-35% by LV gram.  . History of radiation exposure     right vocal cord squamous cell cancer  . Essential hypertension   . Dyslipidemia   . 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 isolated episode, not on [place].  Marland Kitchen Vitamin D deficiency   . Coronary artery disease     a. 06/2015 Cardiac CT: Ca score 1103 (84th %'ile);  b. 07/2015 Cath: LM 70, LAD 80p, 100/55m, D1 70, D2 95, RI 75, RCA 100p/m;  c. 07/2015 CABG x 5 (LIMA->LAD, VG->Diag, VG->OM1->OM2, VG->OM3).  . Osteoarthritis     a. R-shoulder, L-knee [facility])  . Diabetes mellitus without complication ([place]) [id number]  . Impingement syndrome of right shoulder 10/2015    s/p steroid injection Dr Sabra Heck  . 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 Tonsillectomy  1949  . Biceps tendon repair Right 1993  . Knee arthroscopy Left remote  . Cardiac catheterization N/A 07/05/2015    Procedure: Left Heart Cath and Coronary Angiography;  Surgeon: Minna Merritts, MD;  Location: [facility];  Service: Cardiovascular;  Laterality: N/A;  . Colonoscopy  2007  . Coronary artery bypass graft N/A 07/29/2015    Procedure: CORONARY ARTERY BYPASS GRAFTING (CABG) x  5 (LIMA to LAD, SVG to DIAGONAL, SVG SEQUENTIALLY to OM1 and OM2, SVG to OM3) with Endoscopic Vein Havesting of GREATER SAPHENOUS VEIN from RIGHT THIGH and partial LOWER LEG ;  Surgeon: [name], MD;  Location: [facility];  Service: Open Heart Surgery;  Laterality: N/A;  . Tee without cardioversion N/A 07/29/2015    Procedure: TRANSESOPHAGEAL ECHOCARDIOGRAM (TEE);  Surgeon: [name], MD;  Location: [facility];  Service: Open Heart Surgery;  Laterality: N/A;  . Skin cancer excision  10/2015    BCC - L ala (pending MOHs) and L scapula (complete  excision)    Current Outpatient Prescriptions  Medication Sig Dispense Refill  . acetaminophen (TYLENOL) 500 MG tablet Take 500 mg by mouth every 6 (six) hours as needed for mild pain.     Marland Kitchen aspirin (ASPIRIN EC) 81 MG EC tablet Take 81 mg by mouth daily. Swallow whole.    . Cholecalciferol (VITAMIN D3) 1000 UNITS CAPS Take 1 capsule (1,000 Units total) by mouth daily. 30 capsule   . fluticasone (FLONASE) 50 MCG/ACT nasal spray Place 1 spray into both nostrils as needed for allergies.     Marland Kitchen lansoprazole (PREVACID) 30 MG capsule Take 1 capsule (30 mg total) by mouth every morning. 90 capsule 3  . lisinopril (PRINIVIL,ZESTRIL) 2.5 MG tablet Take 1 tablet (2.5 mg total) by mouth daily. 90 tablet 3  . meloxicam (MOBIC) 15 MG tablet Take 7.5 mg by mouth once a week. Saturday or sunday  1  . metoprolol succinate (TOPROL-XL) 25 MG 24 hr tablet Take 1 tablet (25 mg total) by mouth daily. 90 tablet 3  . ranitidine (ZANTAC) 150 MG tablet Take 1 tablet (150 mg total) by mouth at bedtime. 180 tablet 1  . atorvastatin (LIPITOR) 40 MG tablet Take 1 tablet (40 mg total) by mouth daily. 30 tablet 11   No current facility-administered medications for this visit.    Allergies:   Review of patient's allergies indicates no known allergies.   Social History:  The patient  reports that he quit smoking about 37 years ago. His smoking use included Cigarettes. He has a 5 pack-year smoking history. He has never used smokeless tobacco. He reports that he drinks alcohol. He reports that he does not use illicit drugs.   Family History:  The patient's family history includes Alcoholism in his father; CAD (age of onset: 51) in his father; Diabetes in his father; Hyperlipidemia in his father; Hypertension in his father.  ROS:   Review of Systems  Constitutional: Negative for fever, chills, weight loss, malaise/fatigue and diaphoresis.  HENT: Negative for congestion.   Eyes: Negative for discharge and redness.    Respiratory: Negative for cough, hemoptysis, sputum production, shortness of breath and wheezing.   Cardiovascular: Negative for chest pain, palpitations, orthopnea, claudication, leg swelling and PND.  Gastrointestinal: Negative for nausea, vomiting and abdominal pain.  Musculoskeletal: Negative for myalgias and falls.  Skin: Negative for rash.  Neurological: Negative for dizziness, tingling, tremors, sensory change, speech change, focal weakness, loss of consciousness and weakness.  Endo/Heme/Allergies: Does not bruise/bleed easily.  Psychiatric/Behavioral: Negative for substance abuse. The patient is not nervous/anxious.   All other systems reviewed and are negative.    PHYSICAL EXAM:  VS:  BP 138/70 mmHg  Pulse 74  Ht 6\' 1"  (1.854 m)  Wt 225 lb 4 oz (102.173 kg)  BMI 29.72 kg/m2 BMI: Body mass index is 29.72 kg/(m^2). Well nourished, well developed, in no acute distress HEENT: normocephalic,  atraumatic Neck: no JVD, carotid bruits or masses Cardiac:  normal S1, S2; RRR; no murmurs, rubs, or gallops Lungs:  clear to auscultation bilaterally, no wheezing, rhonchi or rales Abd: soft, nontender, no hepatomegaly, + BS MS: no deformity or atrophy Ext: no edema Skin: warm and dry, no rash Neuro:  moves all extremities spontaneously, no focal abnormalities noted, follows commands Psych: euthymic mood, full affect   EKG:  Was not ordered today.   Recent Labs: 07/25/2015: ALT 20 07/30/2015: Magnesium 2.2 07/31/2015: B Natriuretic Peptide 280.3*; Hemoglobin 10.9*; Platelets 122* 10/14/2015: BUN 17; Creatinine, Ser 0.96; Potassium 4.6; Sodium 140  10/14/2015: Cholesterol 167; HDL 61.00; LDL Cholesterol 80; Total CHOL/HDL Ratio 3; Triglycerides 126.0; VLDL 25.2   CrCl cannot be calculated (Patient has no serum creatinine result on file.).   Wt Readings from Last 3 Encounters:  12/19/15 225 lb 4 oz (102.173 kg)  11/21/15 222 lb (100.699 kg)  11/19/15 223 lb 8 oz (101.379 kg)      Other studies reviewed: Additional studies/records reviewed today include: summarized above  ASSESSMENT AND PLAN:  1. CAD s/p CABG as above: No symptoms concerning for angina. He is doing exceptionally well. Continue aspirin 81 mg, Toprol XL 25 mg, lisinopril 2.5 mg daily, and simvastatin changed to Lipitor as below. He has graduated from cardiac rehab and is now working with [employer].    2. Ischemic cardiomyopathy: He does not appear to be volume overloaded at this time. Continue current medications at this time. Repeat echo 11/2015 showed normalization of EF.   3. HTN: Well controlled. BP running in the [id number] systolic at home. Continue current medications.   4. HLD: Change simvastatin to Lipitor 40 mg daily given his LDL running consistently at 82 to 80 over the past 12 months. Recheck lipid and liver in 4 weeks.   5. DM2: Per PCP. Healthy diet and exercise.   6. History of LV mural thrombus: Previously on Coumadin. This has been discontinued after repeat echo showed normalization of LV systolic function.   7. Left knee pain: He would like to have surgery at some point. He will need to discuss this with his primary cardiologist regarding timing as he has been released from [facility]. He is 5 months post cardiac bypass.   Disposition: F/u with Dr. Rockey Situ, MD in 3 months  Current medicines are reviewed at length with the patient today.  The patient did not have any concerns regarding medicines.  Melvern Banker PA-C 12/19/2015 1:22 PM     [address], [place] [address] [phone]

## 2015-12-19 NOTE — Patient Instructions (Addendum)
Medication Instructions:  Stop simvastatin   Start Lipitor 40 mg Once Daily  Labwork: Lipid & liver panel in 4 weeks.  Date & Time ____________________________________   Testing/Procedures: None ordered  Follow-Up: Your physician recommends that you schedule a follow-up appointment in: 3 months with Dr. Rockey Situ  Date & Time: ________________________________________  Any Other Special Instructions Will Be Listed Below (If Applicable).     If you need a refill on your cardiac medications before your next appointment, please call your pharmacy.

## 2015-12-26 ENCOUNTER — Telehealth: Payer: Self-pay | Admitting: Cardiovascular Disease

## 2015-12-26 ENCOUNTER — Other Ambulatory Visit: Payer: Self-pay | Admitting: Cardiovascular Disease

## 2015-12-26 MED ORDER — METOPROLOL SUCCINATE ER 25 MG PO TB24: 25.0000 mg | ORAL_TABLET | Freq: Every day | ORAL | Status: DC

## 2015-12-26 MED ORDER — LISINOPRIL 2.5 MG PO TABS: 2.5000 mg | ORAL_TABLET | Freq: Every day | ORAL | Status: DC

## 2015-12-26 NOTE — Telephone Encounter (Signed)
Refill sent for 90 day supply for Lisinopril and Metoprolol.

## 2015-12-26 NOTE — Telephone Encounter (Signed)
°*  STAT* If patient is at the pharmacy, call can be transferred to refill team.   1. Which medications need to be refilled? (please list name of each medication and dose if known) Lisinopril and Metoprolol   2. Which pharmacy/location (including street and city if local pharmacy) is medication to be sent to? [facility] [address]   3. Do they need a 30 day or 90 day supply? 90 day

## 2016-01-05 IMAGING — DX DG CHEST 2V
2 series · 2 of 2 positions shown · non-contrast
Comparison: Single view of the chest 07/30/2015.

CLINICAL DATA: Status post CABG 07/29/2015.

EXAM:
CHEST  2 VIEW

[w chest pa]
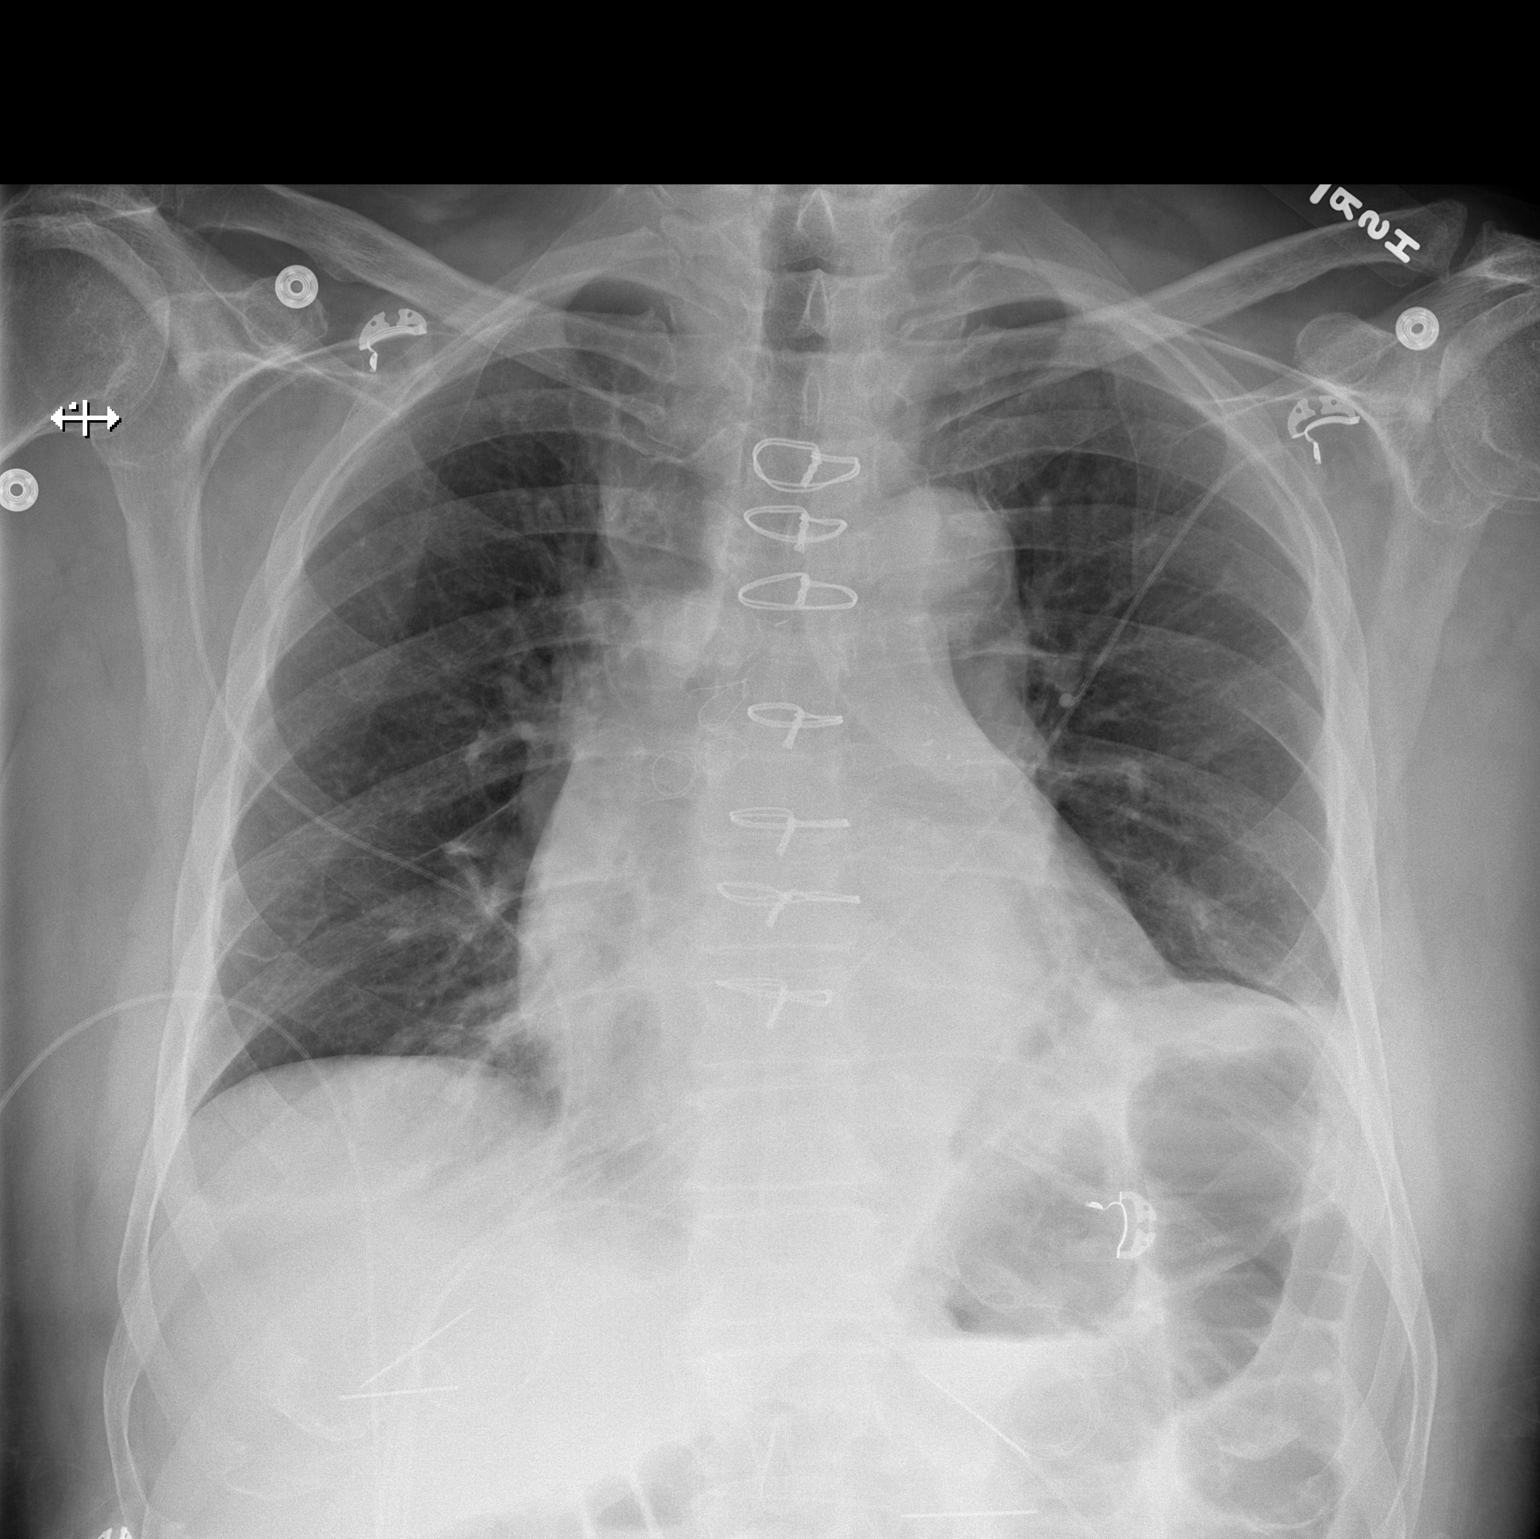

[w chest lat]
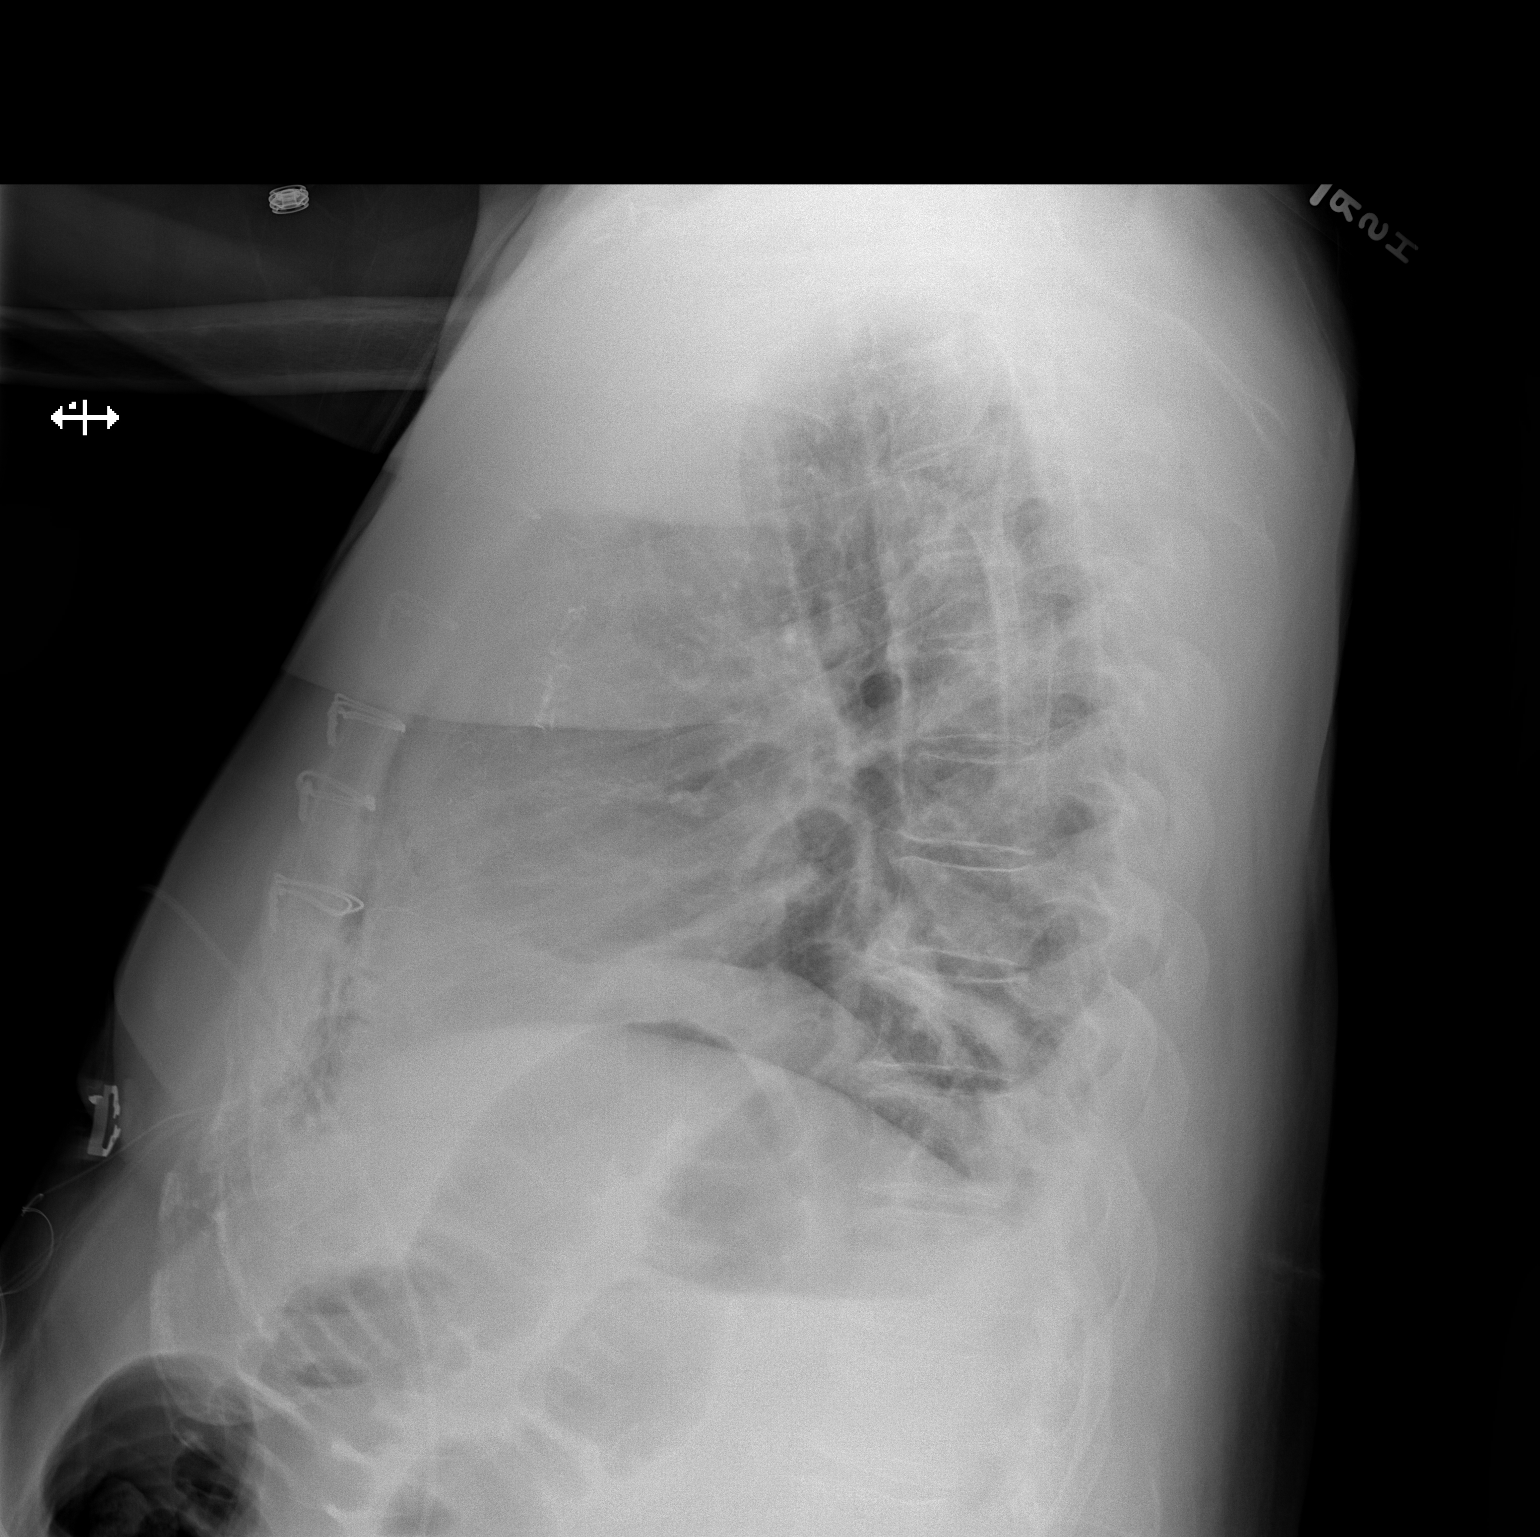

[2 of 2 positions shown; findings below may reference images not displayed]

FINDINGS: Left chest tube, mediastinal drain and right IJ approach Swan-Ganz
catheter have all been removed. Mild subsegmental atelectasis is
seen in the lung bases. The lungs are otherwise clear. Trace
bilateral pleural effusions are seen. The patient is status post
CABG with 7 intact median sternotomy wires identified. There is
cardiomegaly but no edema.
IMPRESSION: Status post removal of support apparatus. Negative for pneumothorax.

Mild subsegmental atelectasis in the lung bases and trace bilateral
pleural effusions.

## 2016-01-16 ENCOUNTER — Other Ambulatory Visit: Payer: Medicare Other

## 2016-01-23 ENCOUNTER — Other Ambulatory Visit (INDEPENDENT_AMBULATORY_CARE_PROVIDER_SITE_OTHER): Payer: Medicare Other

## 2016-01-23 DIAGNOSIS — E785 Hyperlipidemia, unspecified: Secondary | ICD-10-CM

## 2016-01-23 DIAGNOSIS — Z79899 Other long term (current) drug therapy: Secondary | ICD-10-CM

## 2016-01-24 LAB — HEPATIC FUNCTION PANEL
ALK PHOS: 57 IU/L (ref 39–117)
ALT: 13 IU/L (ref 0–44)
AST: 30 IU/L (ref 0–40)
Albumin: 4.8 g/dL (ref 3.5–4.8)
BILIRUBIN TOTAL: 0.6 mg/dL (ref 0.0–1.2)
BILIRUBIN, DIRECT: 0.13 mg/dL (ref 0.00–0.40)
Total Protein: 7.4 g/dL (ref 6.0–8.5)

## 2016-01-24 LAB — LIPID PANEL
CHOLESTEROL TOTAL: 123 mg/dL (ref 100–199)
Chol/HDL Ratio: 2.5 ratio units (ref 0.0–5.0)
HDL: 49 mg/dL (ref >39)
LDL Calculated: 51 mg/dL (ref 0–99)
TRIGLYCERIDES: 116 mg/dL (ref 0–149)
VLDL Cholesterol Cal: 23 mg/dL (ref 5–40)

## 2016-02-10 ENCOUNTER — Encounter: Payer: Self-pay | Admitting: Physician Assistant

## 2016-02-10 ENCOUNTER — Telehealth: Payer: Self-pay

## 2016-02-10 ENCOUNTER — Other Ambulatory Visit: Payer: Self-pay

## 2016-02-10 MED ORDER — ATORVASTATIN CALCIUM 40 MG PO TABS: 40.0000 mg | ORAL_TABLET | Freq: Every day | ORAL | Status: DC

## 2016-02-10 NOTE — Telephone Encounter (Signed)
Received cardiac clearance request for pt to proceed w/ Left TKR on 03/18/16 w/ Dr. Sabra Heck. Per Dr. Rockey Situ, pt is cleared to proceed w/ no med changes. Faxed to [facility] @ [phone].

## 2016-02-13 ENCOUNTER — Telehealth: Payer: Self-pay | Admitting: Family Medicine

## 2016-02-13 DIAGNOSIS — I10 Essential (primary) hypertension: Secondary | ICD-10-CM

## 2016-02-13 DIAGNOSIS — D649 Anemia, unspecified: Secondary | ICD-10-CM | POA: Insufficient documentation

## 2016-02-13 DIAGNOSIS — Z01818 Encounter for other preprocedural examination: Secondary | ICD-10-CM

## 2016-02-13 NOTE — Telephone Encounter (Signed)
Patient notified and lab appt scheduled.  

## 2016-02-13 NOTE — Telephone Encounter (Signed)
Received clearance request for upcoming L knee replacement. plz have pt come in for labwork this week or next week (nonfasting).  If abnormal may have him come in for office visit as well. Otherwise will clear for surgery.

## 2016-02-17 ENCOUNTER — Other Ambulatory Visit (INDEPENDENT_AMBULATORY_CARE_PROVIDER_SITE_OTHER): Payer: Medicare Other

## 2016-02-17 DIAGNOSIS — D649 Anemia, unspecified: Secondary | ICD-10-CM

## 2016-02-17 DIAGNOSIS — Z01818 Encounter for other preprocedural examination: Secondary | ICD-10-CM

## 2016-02-17 DIAGNOSIS — I10 Essential (primary) hypertension: Secondary | ICD-10-CM | POA: Diagnosis not present

## 2016-02-17 LAB — BASIC METABOLIC PANEL
BUN: 19 mg/dL (ref 6–23)
CO2: 30 mEq/L (ref 19–32)
Calcium: 10 mg/dL (ref 8.4–10.5)
Chloride: 101 mEq/L (ref 96–112)
Creatinine, Ser: 1.01 mg/dL (ref 0.40–1.50)
GFR: 77.06 mL/min (ref >60.00)
GLUCOSE: 164 mg/dL — AB (ref 70–99)
POTASSIUM: 4.7 meq/L (ref 3.5–5.1)
Sodium: 140 mEq/L (ref 135–145)

## 2016-02-17 LAB — CBC WITH DIFFERENTIAL/PLATELET
Basophils Absolute: 0 10*3/uL (ref 0.0–0.1)
Basophils Relative: 0.3 % (ref 0.0–3.0)
EOS PCT: 1.3 % (ref 0.0–5.0)
Eosinophils Absolute: 0.1 10*3/uL (ref 0.0–0.7)
HCT: 44.3 % (ref 39.0–52.0)
Hemoglobin: 14.8 g/dL (ref 13.0–17.0)
LYMPHS ABS: 3 10*3/uL (ref 0.7–4.0)
Lymphocytes Relative: 31.4 % (ref 12.0–46.0)
MCHC: 33.4 g/dL (ref 30.0–36.0)
MCV: 96 fl (ref 78.0–100.0)
MONO ABS: 0.6 10*3/uL (ref 0.1–1.0)
Monocytes Relative: 6.2 % (ref 3.0–12.0)
NEUTROS PCT: 60.8 % (ref 43.0–77.0)
Neutro Abs: 5.9 10*3/uL (ref 1.4–7.7)
PLATELETS: 154 10*3/uL (ref 150.0–400.0)
RBC: 4.62 Mil/uL (ref 4.22–5.81)
RDW: 14.5 % (ref 11.5–15.5)
WBC: 9.7 10*3/uL (ref 4.0–10.5)

## 2016-02-17 LAB — IBC PANEL
Iron: 142 ug/dL (ref 42–165)
Saturation Ratios: 31.1 % (ref 20.0–50.0)
TRANSFERRIN: 326 mg/dL (ref 212.0–360.0)

## 2016-02-17 LAB — VITAMIN B12: VITAMIN B 12: 226 pg/mL (ref 211–911)

## 2016-02-17 LAB — PROTIME-INR
INR: 1.1 ratio — ABNORMAL HIGH (ref 0.8–1.0)
Prothrombin Time: 11.1 s (ref 9.6–13.1)

## 2016-02-17 LAB — FOLATE: Folate: 19.6 ng/mL (ref >5.9)

## 2016-02-17 LAB — FERRITIN: Ferritin: 75.1 ng/mL (ref 22.0–322.0)

## 2016-02-18 LAB — PATHOLOGIST SMEAR REVIEW

## 2016-02-19 ENCOUNTER — Encounter: Payer: Self-pay | Admitting: Cardiovascular Disease

## 2016-02-19 NOTE — Telephone Encounter (Signed)
See result note. plz fax clearance to Dr Ammie Ferrier office. In Kim's box.

## 2016-02-19 NOTE — Telephone Encounter (Signed)
Not yet as I haven't gotten the ok from Dr. Darnell Level to do so.

## 2016-02-19 NOTE — Telephone Encounter (Signed)
Clearance and office notes faxed to [phone].

## 2016-02-19 NOTE — Telephone Encounter (Signed)
Judeen Hammans @ [facility] dr Sabra Heck called wanted to know if you faxed surgical  clearance Fax [phone] Ph  [phone] Ext [phone]

## 2016-02-20 ENCOUNTER — Telehealth: Payer: Self-pay | Admitting: Family Medicine

## 2016-02-20 ENCOUNTER — Encounter: Payer: Self-pay | Admitting: Cardiovascular Disease

## 2016-02-20 NOTE — Telephone Encounter (Signed)
[facility] called- they only got the cover sheet of the medical clearance form.   Please re-fax to [phone] thanks

## 2016-02-20 NOTE — Telephone Encounter (Signed)
Re-faxed.

## 2016-02-21 ENCOUNTER — Other Ambulatory Visit: Payer: Self-pay | Admitting: Specialist

## 2016-03-04 ENCOUNTER — Encounter
Admission: RE | Admit: 2016-03-04 | Discharge: 2016-03-04 | Disposition: A | Discharge status: Home / Self Care | Payer: Medicare Other | Source: Ambulatory Visit | Attending: Specialist | Admitting: Specialist

## 2016-03-04 ENCOUNTER — Telehealth: Payer: Self-pay | Admitting: Cardiovascular Disease

## 2016-03-04 DIAGNOSIS — Z01812 Encounter for preprocedural laboratory examination: Secondary | ICD-10-CM | POA: Insufficient documentation

## 2016-03-04 LAB — URINALYSIS COMPLETE WITH MICROSCOPIC (ARMC ONLY)
BILIRUBIN URINE: NEGATIVE
Bacteria, UA: NONE SEEN
Hgb urine dipstick: NEGATIVE
Ketones, ur: NEGATIVE mg/dL
Leukocytes, UA: NEGATIVE
Nitrite: NEGATIVE
Protein, ur: NEGATIVE mg/dL
SQUAMOUS EPITHELIAL / LPF: NONE SEEN
Specific Gravity, Urine: 1.022 (ref 1.005–1.030)
pH: 5 (ref 5.0–8.0)

## 2016-03-04 LAB — SURGICAL PCR SCREEN
MRSA, PCR: NEGATIVE
Staphylococcus aureus: NEGATIVE

## 2016-03-04 LAB — TYPE AND SCREEN
ABO/RH(D): O POS
ANTIBODY SCREEN: NEGATIVE

## 2016-03-04 LAB — ABO/RH: ABO/RH(D): O POS

## 2016-03-04 LAB — PROTIME-INR
INR: 1.01
Prothrombin Time: 13.5 seconds (ref 11.4–15.0)

## 2016-03-04 LAB — HEMOGLOBIN A1C: Hgb A1c MFr Bld: 6.4 % — ABNORMAL HIGH (ref 4.0–6.0)

## 2016-03-04 NOTE — Telephone Encounter (Signed)
Patient says Dr. Sabra Heck is doing knee surgery Wednesday 03-18-16 patient needs to hold asa 81 mg po 10 days prior to surgery and needs clearance to do so.  Please call.

## 2016-03-04 NOTE — Patient Instructions (Signed)
Your procedure is scheduled on: Wednesday 03/18/16 Report to Day Surgery. 2ND FLOOR MEDICAL MALL ENTRANCE To find out your arrival time please call [phone] between 1PM - 3PM on Tuesday 03/17/16.  Remember: Instructions that are not followed completely may result in serious medical risk, up to and including death, or upon the discretion of your surgeon and anesthesiologist your surgery may need to be rescheduled.    __X__ 1. Do not eat food or drink liquids after midnight. No gum chewing or hard candies.     __X__ 2. No Alcohol for 24 hours before or after surgery.   ____ 3. Bring all medications with you on the day of surgery if instructed.    __X__ 4. Notify your doctor if there is any change in your medical condition     (cold, fever, infections).     Do not wear jewelry, make-up, hairpins, clips or nail polish.  Do not wear lotions, powders, or perfumes.   Do not shave 48 hours prior to surgery. Men may shave face and neck.  Do not bring valuables to the hospital.    [facility] is not responsible for any belongings or valuables.               Contacts, dentures or bridgework may not be worn into surgery.  Leave your suitcase in the car. After surgery it may be brought to your room.  For patients admitted to the hospital, discharge time is determined by your                treatment team.   Patients discharged the day of surgery will not be allowed to drive home.   Please read over the following fact sheets that you were given:   MRSA Information and Surgical Site Infection Prevention   __X__ Take these medicines the morning of surgery with A SIP OF WATER:    1. PREVACID  2. LISINOPRIL  3. METOPROLOL  4.   5.  6.  ____ Fleet Enema (as directed)   __X__ Use CHG Soap as directed  ____ Use inhalers on the day of surgery  ____ Stop metformin 2 days prior to surgery    ____ Take 1/2 of usual insulin dose the night before surgery and none on the morning of surgery.    __X__ Stop Coumadin/Plavix/aspirin on CONTACT DR Rockey Situ REGARDING ABILITY TO STOP ASPIRIN 10 DAYS PRIOR TO SURGERY  __X__ Stop Anti-inflammatories on STOP MELOXICAM 7 DAYS BEFORE SURGERY   __X__ Stop supplements until after surgery. STOP VITAMIN B12 7 DAYS BEFORE SURGERY  ____ Bring C-Pap to the hospital.

## 2016-03-04 NOTE — Telephone Encounter (Signed)
Patient just wanted to verify cardiac clearance for his upcoming surgery. Let him know that his cardiac clearance form had been filled out and signed by Dr. Rockey Situ with instructions and faxed to their office. He verified that they had in fact received it and just wanted [place] to know that they wanted him to hold his aspirin for 10 days prior to surgery. Checked with Dr. Rockey Situ and he does not want patient to STOP taking the aspirin. Instructed patient to continue taking the aspirin 81 mg per Dr. Rockey Situ and he verbalized agreement and understanding of instructions with no further questions at this time. Let him know that if they have further questions regarding this to please give [place] a call.

## 2016-03-06 NOTE — Pre-Procedure Instructions (Signed)
HgbA1c result sent to Anesthesia and Dr. Jonny Ruiz for review.

## 2016-03-17 ENCOUNTER — Encounter: Payer: Self-pay | Admitting: Cardiovascular Disease

## 2016-03-17 ENCOUNTER — Ambulatory Visit (INDEPENDENT_AMBULATORY_CARE_PROVIDER_SITE_OTHER): Payer: Medicare Other | Admitting: Cardiovascular Disease

## 2016-03-17 VITALS — BP 134/78 | HR 65 | Ht 73.0 in | Wt 220.8 lb

## 2016-03-17 DIAGNOSIS — R7303 Prediabetes: Secondary | ICD-10-CM

## 2016-03-17 DIAGNOSIS — I2510 Atherosclerotic heart disease of native coronary artery without angina pectoris: Secondary | ICD-10-CM | POA: Diagnosis not present

## 2016-03-17 DIAGNOSIS — I10 Essential (primary) hypertension: Secondary | ICD-10-CM | POA: Diagnosis not present

## 2016-03-17 DIAGNOSIS — E785 Hyperlipidemia, unspecified: Secondary | ICD-10-CM | POA: Diagnosis not present

## 2016-03-17 DIAGNOSIS — I213 ST elevation (STEMI) myocardial infarction of unspecified site: Secondary | ICD-10-CM | POA: Diagnosis not present

## 2016-03-17 DIAGNOSIS — I513 Intracardiac thrombosis, not elsewhere classified: Secondary | ICD-10-CM

## 2016-03-17 DIAGNOSIS — Z01810 Encounter for preprocedural cardiovascular examination: Secondary | ICD-10-CM

## 2016-03-17 NOTE — Assessment & Plan Note (Signed)
Currently with no symptoms of angina. No further workup at this time. Continue current medication regimen. 

## 2016-03-17 NOTE — Assessment & Plan Note (Signed)
Blood pressure is well controlled on today's visit. No changes made to the medications. 

## 2016-03-17 NOTE — Assessment & Plan Note (Signed)
Acceptable risk for total knee replacement surgery on the left tomorrow with Dr. Earnestine Leys No further cardiac testing needed Would recommend minimizing IV fluids through the procedure and postoperatively. Would continue aspirin 81 mg daily through the procedure Currently does not take Plavix or warfarin

## 2016-03-17 NOTE — H&P (Signed)
TOTAL KNEE ADMISSION H&P  Patient is being admitted for left total knee arthroplasty.  Subjective:  Chief Complaint:left knee pain.  HPI: Shannon Chung, 75 y.o. male, has a history of pain and functional disability in the left knee due to arthritis and has failed non-surgical conservative treatments for greater than 12 weeks to includeNSAID's and/or analgesics, corticosteriod injections, flexibility and strengthening excercises and use of assistive devices.  Onset of symptoms was gradual, starting 8 years ago with gradually worsening course since that time. The patient noted no past surgery on the left knee(s).  Patient currently rates pain in the left knee(s) at 7 out of 10 with activity. Patient has night pain, worsening of pain with activity and weight bearing, pain that interferes with activities of daily living, pain with passive range of motion and crepitus.  Patient has evidence of subchondral cysts, subchondral sclerosis, periarticular osteophytes, joint subluxation and joint space narrowing by imaging studies. This patient has had  . There is no active infection.  Patient Active Problem List   Diagnosis Date Noted  . Preop cardiovascular exam 03/17/2016  . Anemia, unspecified 02/13/2016  . Medicare annual wellness visit, subsequent 10/21/2015  . Advanced care planning/counseling discussion 10/21/2015  . Coronary artery disease   . Prediabetes 08/12/2015  . Vitamin D deficiency   . History of radiation exposure   . Left ventricular apical thrombus ([place]) 01/02/2015  . Ischemic cardiomyopathy 01/02/2015  . CAD (coronary artery disease), native coronary artery 01/02/2015  . Hyperlipidemia 01/02/2015  . Essential hypertension 01/02/2015  . Osteoarthritis 01/02/2015   Past Medical History  Diagnosis Date  . Skin cancer     squamous and basal, sees derm regularly  . 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 Mural thrombus of cardiac apex ([place])     a. 06/2014: LV;  resolved with coumadin-->no residual on f/u echo, no longer on coumadin.  . Ischemic cardiomyopathy     a. dilated, EF 35% improved to 45-50% (2015);  b. 07/2015 EF 25-35% by LV gram.  . History of radiation exposure     right vocal cord squamous cell cancer  . Essential hypertension   . Dyslipidemia   . 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 isolated episode, not on [place].  Marland Kitchen Vitamin D deficiency   . Coronary artery disease     a. 06/2015 Cardiac CT: Ca score 1103 (84th %'ile);  b. 07/2015 Cath: LM 70, LAD 80p, 100/88m, D1 70, D2 95, RI 75, RCA 100p/m;  c. 07/2015 CABG x 5 (LIMA->LAD, VG->Diag, VG->OM1->OM2, VG->OM3).  . Osteoarthritis     a. R-shoulder, L-knee [facility])  . Diabetes mellitus without complication ([place]) [id number]  . Impingement syndrome of right shoulder 10/2015    s/p steroid injection Dr Sabra Heck  . 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 GERD (gastroesophageal reflux disease)     Past Surgical History  Procedure Laterality Date  . Tonsillectomy  1949  . Biceps tendon repair Right 1993  . Knee arthroscopy Left remote  . Cardiac catheterization N/A 07/05/2015    Procedure: Left Heart Cath and Coronary Angiography;  Surgeon: Minna Merritts, MD;  Location: [facility];  Service: Cardiovascular;  Laterality: N/A;  . Colonoscopy  2007  . Coronary artery bypass graft N/A 07/29/2015    Procedure: CORONARY ARTERY BYPASS GRAFTING (CABG) x 5 (LIMA to LAD, SVG to DIAGONAL, SVG SEQUENTIALLY to OM1 and  OM2, SVG to OM3) with Endoscopic Vein Havesting of GREATER SAPHENOUS VEIN from RIGHT THIGH and partial LOWER LEG ;  Surgeon: [name], MD;  Location: [facility];  Service: Open Heart Surgery;  Laterality: N/A;  . Tee without cardioversion N/A 07/29/2015    Procedure: TRANSESOPHAGEAL ECHOCARDIOGRAM (TEE);  Surgeon: [name], MD;  Location: [facility];  Service: Open Heart Surgery;  Laterality: N/A;  . Skin cancer excision  10/2015    BCC - L  ala (pending MOHs) and L scapula (complete excision)    No prescriptions prior to admission   No Known Allergies  Social History  Substance Use Topics  . Smoking status: Former Smoker -- 0.50 packs/day for 10 years    Types: Cigarettes    Quit date: 11/02/1978  . Smokeless tobacco: Never Used  . Alcohol Use: 0.0 oz/week    0 Standard drinks or equivalent per week     Comment: beer/wine on weekends    Family History  Problem Relation Age of Onset  . CAD Father 33    MI  . Hypertension Father   . Hyperlipidemia Father   . Alcoholism Father   . Diabetes Father      Review of Systems  Constitutional: Negative.   HENT: Negative.   Eyes: Negative.   Respiratory: Negative.   Cardiovascular: Negative.   Gastrointestinal: Negative.   Genitourinary: Negative.   Musculoskeletal: Negative.   Skin: Negative.   Neurological: Negative.   Endo/Heme/Allergies: Negative.   Psychiatric/Behavioral: Negative.     Objective:  Physical Exam  Constitutional: He is oriented to person, place, and time. He appears well-developed and well-nourished.  HENT:  Head: Normocephalic.  Eyes: Pupils are equal, round, and reactive to light.  Neck: Normal range of motion. Neck supple.  Cardiovascular: Normal rate, regular rhythm and normal heart sounds.   Respiratory: Effort normal.  GI: Soft.  Musculoskeletal:       Left knee: He exhibits decreased range of motion, deformity, abnormal alignment and bony tenderness. Tenderness found. Medial joint line tenderness noted.  Neurological: He is alert and oriented to person, place, and time.  Skin: Skin is warm and dry.  Psychiatric: He has a normal mood and affect. His behavior is normal.    Vital signs in last 24 hours: Pulse Rate:  [65] 65 (05/16 1049) BP: (134)/(78) 134/78 mmHg (05/16 1049) Weight:  [100.132 kg (220 lb 12 oz)] 100.132 kg (220 lb 12 oz) (05/16 1049)  Labs:   Estimated body mass index is 29.72 kg/(m^2) as calculated from the  following:   Height as of 12/19/15: 6\' 1"  (1.854 m).   Weight as of 12/19/15: 102.173 kg (225 lb 4 oz).   Imaging Review Plain radiographs demonstrate severe degenerative joint disease of the left knee(s). The overall alignment ismild varus. The bone quality appears to be excellent for age and reported activity level.  Assessment/Plan:  End stage arthritis, left knee   The patient history, physical examination, clinical judgment of the provider and imaging studies are consistent with end stage degenerative joint disease of the left knee(s) and total knee arthroplasty is deemed medically necessary. The treatment options including medical management, injection therapy arthroscopy and arthroplasty were discussed at length. The risks and benefits of total knee arthroplasty were presented and reviewed. The risks due to aseptic loosening, infection, stiffness, patella tracking problems, thromboembolic complications and other imponderables were discussed. The patient acknowledged the explanation, agreed to proceed with the plan and consent was signed. Patient is being  admitted for inpatient treatment for surgery, pain control, PT, OT, prophylactic antibiotics, VTE prophylaxis, progressive ambulation and ADL's and discharge planning. The patient is planning to be discharged home with home health services

## 2016-03-17 NOTE — Assessment & Plan Note (Signed)
Cholesterol is at goal on the current lipid regimen. No changes to the medications were made.  

## 2016-03-17 NOTE — Assessment & Plan Note (Signed)
Discussed the echocardiogram result, no need for anticoagulation with warfarin given normal LV function

## 2016-03-17 NOTE — Progress Notes (Signed)
Patient ID: Shannon Chung, male    DOB: August 10, 1941, 75 y.o.   MRN: [id number]  HPI Comments: Mr. Irons is a 75 year old male with coronary artery disease, ischemic cardiomyopathy, initial ejection fraction in July 2015 of 35%, cardiac PET scan showing scar in the apical and periapical region, mural thrombus seen in July 2015, started on anticoagulation, history of hyperlipidemia,  CT coronary calcium scoring 2016 greater than 1000 leading to cardiac catheterization showing severe multivessel disease, with CABG at [facility] August 2016,  who presents for routine follow-up of his coronary artery disease   In follow-up, he reports that he is doing well, denies any symptoms concerning for angina He uses the gym in the hospital on a regular basis, denies shortness of breath Reports having severe left knee pain Overall has no complaints Lab work reviewed with him showing total cholesterol 123, LDL 51, hemoglobin A1c 6.4  Echocardiogram showing ejection fraction greater than 55% (improved after revascularization)  Scheduled to have total left knee replacement by Dr. Earnestine Leys tomorrow  EKG on today's visit shows normal sinus rhythm with rate 65 bpm, old anterior MI, consider inferior MI, no change from prior EKGs   EKG on today's visit shows normal sinus rhythm with nonspecific ST abnormality, old anterior MI, unable to exclude old inferior MI  Other past medical history Previously noted to have abnormal EKG and then was referred to cardiology. They had talked about doing a cardiac catheterization but instead did a PET viability study.   Viability study showed scar in the periapical region, no hibernating myocardium, no ischemia, ejection fraction estimated at 45% Repeat echocardiogram November 2015 showing ejection fraction up to 45%, report suggesting no residual thrombus  No history of squamous cell carcinoma of the hypopharynx, status post radiation   total cholesterol 155, LDL  82 in January 2016 Total cholesterol previously 175 in July 2015    No Known Allergies  Outpatient Encounter Prescriptions as of 03/17/2016  Medication Sig  . acetaminophen (TYLENOL) 500 MG tablet Take 500 mg by mouth every 6 (six) hours as needed for mild pain.   Marland Kitchen atorvastatin (LIPITOR) 40 MG tablet Take 1 tablet (40 mg total) by mouth daily. (Patient taking differently: Take 40 mg by mouth at bedtime. )  . Cholecalciferol (VITAMIN D3) 1000 UNITS CAPS Take 1 capsule (1,000 Units total) by mouth daily.  . fluticasone (FLONASE) 50 MCG/ACT nasal spray Place 1 spray into both nostrils as needed for allergies.   Marland Kitchen lansoprazole (PREVACID) 30 MG capsule Take 1 capsule (30 mg total) by mouth every morning.  Marland Kitchen lisinopril (PRINIVIL,ZESTRIL) 2.5 MG tablet Take 1 tablet (2.5 mg total) by mouth daily.  . metoprolol succinate (TOPROL-XL) 25 MG 24 hr tablet Take 1 tablet (25 mg total) by mouth daily.  . ranitidine (ZANTAC) 150 MG tablet Take 1 tablet (150 mg total) by mouth at bedtime.  Marland Kitchen aspirin (ASPIRIN EC) 81 MG EC tablet Take 81 mg by mouth daily. Reported on 03/17/2016  . meloxicam (MOBIC) 15 MG tablet Take 7.5 mg by mouth once a week. Reported on 03/17/2016  . vitamin B-12 (CYANOCOBALAMIN) 500 MCG tablet Take 500 mcg by mouth daily. Reported on 03/17/2016   No facility-administered encounter medications on file as of 03/17/2016.    Past Medical History  Diagnosis Date  . Skin cancer     squamous and basal, sees derm regularly  . 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 Mural thrombus of cardiac  [facility])     a. 06/2014: LV; resolved with coumadin-->no residual on f/u echo, no longer on coumadin.  . Ischemic cardiomyopathy     a. dilated, EF 35% improved to 45-50% (2015);  b. 07/2015 EF 25-35% by LV gram.  . History of radiation exposure     right vocal cord squamous cell cancer  . Essential hypertension   . Dyslipidemia   . 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  isolated episode, not on [place].  Marland Kitchen Vitamin D deficiency   . Coronary artery disease     a. 06/2015 Cardiac CT: Ca score 1103 (84th %'ile);  b. 07/2015 Cath: LM 70, LAD 80p, 100/62m, D1 70, D2 95, RI 75, RCA 100p/m;  c. 07/2015 CABG x 5 (LIMA->LAD, VG->Diag, VG->OM1->OM2, VG->OM3).  . Osteoarthritis     a. R-shoulder, L-knee [facility])  . Diabetes mellitus without complication ([place]) [id number]  . Impingement syndrome of right shoulder 10/2015    s/p steroid injection Dr Sabra Heck  . 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 GERD (gastroesophageal reflux disease)     Past Surgical History  Procedure Laterality Date  . Tonsillectomy  1949  . Biceps tendon repair Right 1993  . Knee arthroscopy Left remote  . Cardiac catheterization N/A 07/05/2015    Procedure: Left Heart Cath and Coronary Angiography;  Surgeon: Minna Merritts, MD;  Location: [place] [facility];  Service: Cardiovascular;  Laterality: N/A;  . Colonoscopy  2007  . Coronary artery bypass graft N/A 07/29/2015    Procedure: CORONARY ARTERY BYPASS GRAFTING (CABG) x 5 (LIMA to LAD, SVG to DIAGONAL, SVG SEQUENTIALLY to OM1 and OM2, SVG to OM3) with Endoscopic Vein Havesting of GREATER SAPHENOUS VEIN from RIGHT THIGH and partial LOWER LEG ;  Surgeon: [name], MD;  Location: [place];  Service: Open Heart Surgery;  Laterality: N/A;  . Tee without cardioversion N/A 07/29/2015    Procedure: TRANSESOPHAGEAL ECHOCARDIOGRAM (TEE);  Surgeon: [name], MD;  Location: [place];  Service: Open Heart Surgery;  Laterality: N/A;  . Skin cancer excision  10/2015    BCC - L ala (pending MOHs) and L scapula (complete excision)    Social History  reports that he quit smoking about 37 years ago. His smoking use included Cigarettes. He has a 5 pack-year smoking history. He has never used smokeless tobacco. He reports that he drinks alcohol. He reports that he does not use illicit drugs.  Family History family history  includes Alcoholism in his father; CAD (age of onset: 85) in his father; Diabetes in his father; Hyperlipidemia in his father; Hypertension in his father.   Review of Systems  Constitutional: Negative.   Respiratory: Negative.   Cardiovascular: Negative.   Gastrointestinal: Negative.   Musculoskeletal: Positive for arthralgias.  Skin: Negative.   Neurological: Negative.   Hematological: Negative.   Psychiatric/Behavioral: Negative.   All other systems reviewed and are negative.   BP 134/78 mmHg  Pulse 65  Ht 6\' 1"  (1.854 m)  Wt 220 lb 12 oz (100.132 kg)  BMI 29.13 kg/m2   Physical Exam  Constitutional: He is oriented to person, place, and time. He appears well-developed and well-nourished.  HENT:  Head: Normocephalic.  Nose: Nose normal.  Mouth/Throat: Oropharynx is clear and moist.  Eyes: Conjunctivae are normal. Pupils are equal, round, and reactive to light.  Neck: Normal range of motion. Neck supple. No JVD present.  Cardiovascular: Normal  rate, regular rhythm, S1 normal, S2 normal, normal heart sounds and intact distal pulses.  Exam reveals no gallop and no friction rub.   No murmur heard. Pulmonary/Chest: Effort normal and breath sounds normal. No respiratory distress. He has no wheezes. He has no rales. He exhibits no tenderness.  Abdominal: Soft. Bowel sounds are normal. He exhibits no distension. There is no tenderness.  Musculoskeletal: Normal range of motion. He exhibits no edema or tenderness.  Lymphadenopathy:    He has no cervical adenopathy.  Neurological: He is alert and oriented to person, place, and time. Coordination normal.  Skin: Skin is warm and dry. No rash noted. No erythema.  Psychiatric: He has a normal mood and affect. His behavior is normal. Judgment and thought content normal.      Assessment and Plan   Nursing note and vitals reviewed.

## 2016-03-17 NOTE — Patient Instructions (Signed)
You are doing well. No medication changes were made.  Please call us if you have new issues that need to be addressed before your next appt.  Your physician wants you to follow-up in: 6 months.  You will receive a reminder letter in the mail two months in advance. If you don't receive a letter, please call our office to schedule the follow-up appointment.   

## 2016-03-17 NOTE — Assessment & Plan Note (Signed)
We have encouraged continued exercise, careful diet management in an effort to lose weight. 

## 2016-03-18 ENCOUNTER — Inpatient Hospital Stay: Payer: Medicare Other | Admitting: Anesthesiology

## 2016-03-18 ENCOUNTER — Inpatient Hospital Stay: Payer: Medicare Other

## 2016-03-18 ENCOUNTER — Encounter
Admission: RE | Disposition: A | Discharge status: Home / Self Care | Payer: Self-pay | Source: Ambulatory Visit | Attending: Specialist

## 2016-03-18 ENCOUNTER — Encounter: Payer: Self-pay | Admitting: *Deleted

## 2016-03-18 ENCOUNTER — Inpatient Hospital Stay
Admission: RE | Admit: 2016-03-18 | Discharge: 2016-03-21 | DRG: 470 | Disposition: A | Discharge status: Home / Self Care | Payer: Medicare Other | Source: Ambulatory Visit | Attending: Specialist | Admitting: Specialist

## 2016-03-18 DIAGNOSIS — M1712 Unilateral primary osteoarthritis, left knee: Principal | ICD-10-CM | POA: Diagnosis present

## 2016-03-18 DIAGNOSIS — E119 Type 2 diabetes mellitus without complications: Secondary | ICD-10-CM | POA: Diagnosis present

## 2016-03-18 DIAGNOSIS — Z811 Family history of alcohol abuse and dependence: Secondary | ICD-10-CM

## 2016-03-18 DIAGNOSIS — Z87891 Personal history of nicotine dependence: Secondary | ICD-10-CM

## 2016-03-18 DIAGNOSIS — Z8249 Family history of ischemic heart disease and other diseases of the circulatory system: Secondary | ICD-10-CM

## 2016-03-18 DIAGNOSIS — K219 Gastro-esophageal reflux disease without esophagitis: Secondary | ICD-10-CM | POA: Diagnosis present

## 2016-03-18 DIAGNOSIS — Z8521 Personal history of malignant neoplasm of larynx: Secondary | ICD-10-CM

## 2016-03-18 DIAGNOSIS — Z833 Family history of diabetes mellitus: Secondary | ICD-10-CM

## 2016-03-18 DIAGNOSIS — E559 Vitamin D deficiency, unspecified: Secondary | ICD-10-CM | POA: Diagnosis present

## 2016-03-18 DIAGNOSIS — Z85828 Personal history of other malignant neoplasm of skin: Secondary | ICD-10-CM

## 2016-03-18 DIAGNOSIS — E785 Hyperlipidemia, unspecified: Secondary | ICD-10-CM | POA: Diagnosis present

## 2016-03-18 DIAGNOSIS — I10 Essential (primary) hypertension: Secondary | ICD-10-CM | POA: Diagnosis present

## 2016-03-18 DIAGNOSIS — Z96659 Presence of unspecified artificial knee joint: Secondary | ICD-10-CM

## 2016-03-18 DIAGNOSIS — I2510 Atherosclerotic heart disease of native coronary artery without angina pectoris: Secondary | ICD-10-CM | POA: Diagnosis present

## 2016-03-18 DIAGNOSIS — I255 Ischemic cardiomyopathy: Secondary | ICD-10-CM | POA: Diagnosis present

## 2016-03-18 HISTORY — PX: TOTAL KNEE ARTHROPLASTY: SHX125

## 2016-03-18 LAB — GLUCOSE, CAPILLARY
GLUCOSE-CAPILLARY: 125 mg/dL — AB (ref 65–99)
Glucose-Capillary: 115 mg/dL — ABNORMAL HIGH (ref 65–99)
Glucose-Capillary: 128 mg/dL — ABNORMAL HIGH (ref 65–99)

## 2016-03-18 LAB — CREATININE, SERUM
CREATININE: 0.8 mg/dL (ref 0.61–1.24)
GFR calc Af Amer: >60 mL/min (ref >60)
GFR calc non Af Amer: >60 mL/min (ref >60)

## 2016-03-18 LAB — CBC
HCT: 40.2 % (ref 40.0–52.0)
HEMOGLOBIN: 13.3 g/dL (ref 13.0–18.0)
MCH: 32.6 pg (ref 26.0–34.0)
MCHC: 33.1 g/dL (ref 32.0–36.0)
MCV: 98.4 fL (ref 80.0–100.0)
PLATELETS: 135 10*3/uL — AB (ref 150–440)
RBC: 4.08 MIL/uL — ABNORMAL LOW (ref 4.40–5.90)
RDW: 14.4 % (ref 11.5–14.5)
WBC: 7.8 10*3/uL (ref 3.8–10.6)

## 2016-03-18 SURGERY — ARTHROPLASTY, KNEE, TOTAL
Anesthesia: Spinal | Site: Knee | Laterality: Left | Wound class: Clean

## 2016-03-18 MED ORDER — SODIUM CHLORIDE 0.9 % IJ SOLN
INTRAMUSCULAR | Status: AC
  Filled 2016-03-18: qty 50

## 2016-03-18 MED ORDER — VITAMIN D 1000 UNITS PO TABS
1000.0000 [IU] | ORAL_TABLET | Freq: Every day | ORAL | Status: DC
  Administered 2016-03-19 – 2016-03-21 (×3): 1000 [IU] via ORAL
  Filled 2016-03-18 (×5): qty 1

## 2016-03-18 MED ORDER — ACETAMINOPHEN 650 MG RE SUPP: 650.0000 mg | Freq: Four times a day (QID) | RECTAL | Status: DC | PRN

## 2016-03-18 MED ORDER — METOCLOPRAMIDE HCL 5 MG/ML IJ SOLN: 5.0000 mg | Freq: Three times a day (TID) | INTRAMUSCULAR | Status: DC | PRN

## 2016-03-18 MED ORDER — ONDANSETRON HCL 4 MG PO TABS: 4.0000 mg | ORAL_TABLET | Freq: Four times a day (QID) | ORAL | Status: DC | PRN

## 2016-03-18 MED ORDER — BUPIVACAINE HCL 0.25 % IJ SOLN
INTRAMUSCULAR | Status: DC | PRN
  Administered 2016-03-18: 30 mL

## 2016-03-18 MED ORDER — METOCLOPRAMIDE HCL 10 MG PO TABS: 5.0000 mg | ORAL_TABLET | Freq: Three times a day (TID) | ORAL | Status: DC | PRN

## 2016-03-18 MED ORDER — CELECOXIB 200 MG PO CAPS
ORAL_CAPSULE | ORAL | Status: AC
  Administered 2016-03-18: 200 mg via ORAL
  Filled 2016-03-18: qty 1

## 2016-03-18 MED ORDER — MIDAZOLAM HCL 5 MG/5ML IJ SOLN
INTRAMUSCULAR | Status: DC | PRN
  Administered 2016-03-18: 2 mg via INTRAVENOUS

## 2016-03-18 MED ORDER — BUPIVACAINE LIPOSOME 1.3 % IJ SUSP
INTRAMUSCULAR | Status: AC
  Filled 2016-03-18: qty 20

## 2016-03-18 MED ORDER — BISACODYL 10 MG RE SUPP: 10.0000 mg | Freq: Every day | RECTAL | Status: DC | PRN

## 2016-03-18 MED ORDER — SODIUM CHLORIDE 0.9 % IV SOLN
INTRAVENOUS | Status: DC
  Administered 2016-03-18 (×2): via INTRAVENOUS

## 2016-03-18 MED ORDER — PHENOL 1.4 % MT LIQD
1.0000 | OROMUCOSAL | Status: DC | PRN
Start: 2016-03-18 — End: 2016-03-21
  Filled 2016-03-18: qty 177

## 2016-03-18 MED ORDER — EPHEDRINE SULFATE 50 MG/ML IJ SOLN
INTRAMUSCULAR | Status: DC | PRN
  Administered 2016-03-18 (×2): 10 mg via INTRAVENOUS

## 2016-03-18 MED ORDER — VANCOMYCIN HCL 10 G IV SOLR
1500.0000 mg | Freq: Two times a day (BID) | INTRAVENOUS | Status: DC
  Filled 2016-03-18 (×2): qty 1500

## 2016-03-18 MED ORDER — MENTHOL 3 MG MT LOZG
1.0000 | LOZENGE | OROMUCOSAL | Status: DC | PRN
  Filled 2016-03-18: qty 9

## 2016-03-18 MED ORDER — ACETAMINOPHEN 325 MG PO TABS: 650.0000 mg | ORAL_TABLET | Freq: Four times a day (QID) | ORAL | Status: DC | PRN

## 2016-03-18 MED ORDER — PREGABALIN 75 MG PO CAPS
ORAL_CAPSULE | ORAL | Status: AC
  Administered 2016-03-18: 75 mg via ORAL
  Filled 2016-03-18: qty 1

## 2016-03-18 MED ORDER — PREGABALIN 75 MG PO CAPS
75.0000 mg | ORAL_CAPSULE | Freq: Two times a day (BID) | ORAL | Status: DC
  Administered 2016-03-18 – 2016-03-21 (×6): 75 mg via ORAL
  Filled 2016-03-18 (×6): qty 1

## 2016-03-18 MED ORDER — OXYCODONE HCL 5 MG/5ML PO SOLN: 5.0000 mg | Freq: Once | ORAL | Status: DC | PRN

## 2016-03-18 MED ORDER — METOPROLOL SUCCINATE ER 25 MG PO TB24
25.0000 mg | ORAL_TABLET | Freq: Every day | ORAL | Status: DC
  Administered 2016-03-19 – 2016-03-21 (×3): 25 mg via ORAL
  Filled 2016-03-18 (×3): qty 1

## 2016-03-18 MED ORDER — LISINOPRIL 5 MG PO TABS
2.5000 mg | ORAL_TABLET | Freq: Every day | ORAL | Status: DC
  Administered 2016-03-19 – 2016-03-21 (×3): 2.5 mg via ORAL
  Filled 2016-03-18 (×3): qty 1

## 2016-03-18 MED ORDER — CELECOXIB 200 MG PO CAPS: 200.0000 mg | ORAL_CAPSULE | Freq: Every day | ORAL | Status: DC

## 2016-03-18 MED ORDER — ALUM & MAG HYDROXIDE-SIMETH 200-200-20 MG/5ML PO SUSP: 30.0000 mL | ORAL | Status: DC | PRN

## 2016-03-18 MED ORDER — CHLORHEXIDINE GLUCONATE 4 % EX LIQD: 1.0000 "application " | Freq: Once | CUTANEOUS | Status: DC

## 2016-03-18 MED ORDER — CELECOXIB 200 MG PO CAPS
200.0000 mg | ORAL_CAPSULE | Freq: Every day | ORAL | Status: DC
Start: 2016-03-18 — End: 2016-03-18
  Administered 2016-03-18: 200 mg via ORAL

## 2016-03-18 MED ORDER — ACETAMINOPHEN 10 MG/ML IV SOLN
INTRAVENOUS | Status: AC
  Filled 2016-03-18: qty 100

## 2016-03-18 MED ORDER — SODIUM CHLORIDE 0.9 % IJ SOLN
INTRAMUSCULAR | Status: DC | PRN
  Administered 2016-03-18: 30 mL via INTRAVENOUS

## 2016-03-18 MED ORDER — BUPIVACAINE HCL (PF) 0.25 % IJ SOLN
INTRAMUSCULAR | Status: AC
  Filled 2016-03-18: qty 30

## 2016-03-18 MED ORDER — ENOXAPARIN SODIUM 40 MG/0.4ML ~~LOC~~ SOLN
40.0000 mg | SUBCUTANEOUS | Status: DC
  Administered 2016-03-19 – 2016-03-21 (×3): 40 mg via SUBCUTANEOUS
  Filled 2016-03-18 (×3): qty 0.4

## 2016-03-18 MED ORDER — FAMOTIDINE 20 MG PO TABS
10.0000 mg | ORAL_TABLET | Freq: Every day | ORAL | Status: DC
Start: 2016-03-18 — End: 2016-03-21
  Administered 2016-03-18 – 2016-03-21 (×4): 10 mg via ORAL
  Filled 2016-03-18 (×4): qty 1

## 2016-03-18 MED ORDER — FENTANYL CITRATE (PF) 100 MCG/2ML IJ SOLN
INTRAMUSCULAR | Status: DC | PRN
  Administered 2016-03-18: 50 ug via INTRAVENOUS

## 2016-03-18 MED ORDER — ACETAMINOPHEN 500 MG PO TABS: 500.0000 mg | ORAL_TABLET | Freq: Four times a day (QID) | ORAL | Status: DC | PRN

## 2016-03-18 MED ORDER — VITAMIN B-12 1000 MCG PO TABS
500.0000 ug | ORAL_TABLET | Freq: Every day | ORAL | Status: DC
  Administered 2016-03-19 – 2016-03-21 (×3): 500 ug via ORAL
  Filled 2016-03-18 (×3): qty 1

## 2016-03-18 MED ORDER — MORPHINE SULFATE (PF) 4 MG/ML IV SOLN
INTRAVENOUS | Status: DC | PRN
  Administered 2016-03-18: 4 mg via INTRAVENOUS

## 2016-03-18 MED ORDER — FLUTICASONE PROPIONATE 50 MCG/ACT NA SUSP
1.0000 | NASAL | Status: DC | PRN
Start: 2016-03-18 — End: 2016-03-21
  Filled 2016-03-18: qty 16

## 2016-03-18 MED ORDER — ATORVASTATIN CALCIUM 20 MG PO TABS
40.0000 mg | ORAL_TABLET | Freq: Every day | ORAL | Status: DC
  Administered 2016-03-18 – 2016-03-20 (×3): 40 mg via ORAL
  Filled 2016-03-18 (×3): qty 2

## 2016-03-18 MED ORDER — ACETAMINOPHEN 10 MG/ML IV SOLN
INTRAVENOUS | Status: DC | PRN
  Administered 2016-03-18: 1000 mg via INTRAVENOUS

## 2016-03-18 MED ORDER — SODIUM CHLORIDE 0.9 % IV SOLN: Freq: Once | INTRAVENOUS | Status: DC

## 2016-03-18 MED ORDER — SODIUM CHLORIDE 0.9 % IV SOLN
INTRAVENOUS | Status: DC | PRN
  Administered 2016-03-18: 60 mL

## 2016-03-18 MED ORDER — NEOMYCIN-POLYMYXIN B GU 40-200000 IR SOLN
Status: DC | PRN
  Administered 2016-03-18: 16 mL

## 2016-03-18 MED ORDER — DIPHENHYDRAMINE HCL 12.5 MG/5ML PO ELIX
12.5000 mg | ORAL_SOLUTION | ORAL | Status: DC | PRN
  Filled 2016-03-18: qty 10

## 2016-03-18 MED ORDER — MAGNESIUM HYDROXIDE 400 MG/5ML PO SUSP
30.0000 mL | Freq: Every day | ORAL | Status: DC | PRN
  Administered 2016-03-19: 30 mL via ORAL
  Filled 2016-03-18: qty 30

## 2016-03-18 MED ORDER — BUPIVACAINE HCL (PF) 0.5 % IJ SOLN
INTRAMUSCULAR | Status: DC | PRN
  Administered 2016-03-18: 3 mL

## 2016-03-18 MED ORDER — VANCOMYCIN HCL 10 G IV SOLR
1500.0000 mg | Freq: Two times a day (BID) | INTRAVENOUS | Status: AC
  Administered 2016-03-18 – 2016-03-19 (×2): 1500 mg via INTRAVENOUS
  Filled 2016-03-18 (×2): qty 1500

## 2016-03-18 MED ORDER — OXYCODONE HCL 5 MG PO TABS
5.0000 mg | ORAL_TABLET | ORAL | Status: DC | PRN
  Administered 2016-03-18: 10 mg via ORAL
  Administered 2016-03-18 – 2016-03-19 (×3): 5 mg via ORAL
  Administered 2016-03-19 – 2016-03-21 (×11): 10 mg via ORAL
  Filled 2016-03-18 (×6): qty 2
  Filled 2016-03-18: qty 1
  Filled 2016-03-18 (×2): qty 2
  Filled 2016-03-18: qty 1
  Filled 2016-03-18: qty 2
  Filled 2016-03-18: qty 1
  Filled 2016-03-18 (×3): qty 2

## 2016-03-18 MED ORDER — SODIUM CHLORIDE 0.45 % IV SOLN
INTRAVENOUS | Status: DC
  Administered 2016-03-18 – 2016-03-19 (×3): via INTRAVENOUS

## 2016-03-18 MED ORDER — MORPHINE SULFATE (PF) 2 MG/ML IV SOLN
2.0000 mg | INTRAVENOUS | Status: DC | PRN
  Administered 2016-03-19 (×2): 2 mg via INTRAVENOUS
  Filled 2016-03-18 (×2): qty 1

## 2016-03-18 MED ORDER — FLEET ENEMA 7-19 GM/118ML RE ENEM: 1.0000 | ENEMA | Freq: Once | RECTAL | Status: DC | PRN

## 2016-03-18 MED ORDER — SENNA 8.6 MG PO TABS
1.0000 | ORAL_TABLET | Freq: Two times a day (BID) | ORAL | Status: DC
  Administered 2016-03-18 – 2016-03-21 (×7): 8.6 mg via ORAL
  Filled 2016-03-18 (×7): qty 1

## 2016-03-18 MED ORDER — KETOROLAC TROMETHAMINE 30 MG/ML IJ SOLN
INTRAMUSCULAR | Status: DC | PRN
  Administered 2016-03-18: 30 mg via INTRAVENOUS

## 2016-03-18 MED ORDER — ONDANSETRON HCL 4 MG/2ML IJ SOLN
4.0000 mg | Freq: Four times a day (QID) | INTRAMUSCULAR | Status: DC | PRN
  Administered 2016-03-19: 4 mg via INTRAVENOUS
  Filled 2016-03-18: qty 2

## 2016-03-18 MED ORDER — CELECOXIB 200 MG PO CAPS
200.0000 mg | ORAL_CAPSULE | Freq: Two times a day (BID) | ORAL | Status: DC
  Administered 2016-03-18 – 2016-03-21 (×6): 200 mg via ORAL
  Filled 2016-03-18 (×6): qty 1

## 2016-03-18 MED ORDER — LIDOCAINE HCL (CARDIAC) 20 MG/ML IV SOLN
INTRAVENOUS | Status: DC | PRN
  Administered 2016-03-18: 60 mg via INTRAVENOUS

## 2016-03-18 MED ORDER — PROPOFOL 500 MG/50ML IV EMUL
INTRAVENOUS | Status: DC | PRN
  Administered 2016-03-18: 75 ug/kg/min via INTRAVENOUS

## 2016-03-18 MED ORDER — SODIUM CHLORIDE 0.9 % IV SOLN
1500.0000 mg | INTRAVENOUS | Status: AC
  Administered 2016-03-18: 1500 mg via INTRAVENOUS
  Filled 2016-03-18: qty 1500

## 2016-03-18 MED ORDER — ZOLPIDEM TARTRATE 5 MG PO TABS: 5.0000 mg | ORAL_TABLET | Freq: Every evening | ORAL | Status: DC | PRN

## 2016-03-18 MED ORDER — MORPHINE SULFATE (PF) 4 MG/ML IV SOLN
INTRAVENOUS | Status: AC
  Filled 2016-03-18: qty 1

## 2016-03-18 MED ORDER — PREGABALIN 75 MG PO CAPS
75.0000 mg | ORAL_CAPSULE | Freq: Two times a day (BID) | ORAL | Status: DC
Start: 2016-03-18 — End: 2016-03-18
  Administered 2016-03-18: 75 mg via ORAL

## 2016-03-18 MED ORDER — ACETAMINOPHEN 500 MG PO TABS
1000.0000 mg | ORAL_TABLET | Freq: Four times a day (QID) | ORAL | Status: AC
  Administered 2016-03-18 – 2016-03-19 (×4): 1000 mg via ORAL
  Filled 2016-03-18 (×4): qty 2

## 2016-03-18 MED ORDER — METHOCARBAMOL 500 MG PO TABS: 500.0000 mg | ORAL_TABLET | Freq: Four times a day (QID) | ORAL | Status: DC | PRN

## 2016-03-18 MED ORDER — OXYCODONE HCL 5 MG PO TABS: 5.0000 mg | ORAL_TABLET | Freq: Once | ORAL | Status: DC | PRN

## 2016-03-18 MED ORDER — PANTOPRAZOLE SODIUM 40 MG PO TBEC
40.0000 mg | DELAYED_RELEASE_TABLET | Freq: Every day | ORAL | Status: DC
  Administered 2016-03-19 – 2016-03-21 (×3): 40 mg via ORAL
  Filled 2016-03-18 (×3): qty 1

## 2016-03-18 MED ORDER — DEXTROSE 5 % IV SOLN
500.0000 mg | Freq: Four times a day (QID) | INTRAVENOUS | Status: DC | PRN
  Filled 2016-03-18: qty 5

## 2016-03-18 MED ORDER — FENTANYL CITRATE (PF) 100 MCG/2ML IJ SOLN: 25.0000 ug | INTRAMUSCULAR | Status: DC | PRN

## 2016-03-18 MED ORDER — FERROUS SULFATE 325 (65 FE) MG PO TABS
325.0000 mg | ORAL_TABLET | Freq: Three times a day (TID) | ORAL | Status: DC
  Administered 2016-03-18 – 2016-03-21 (×8): 325 mg via ORAL
  Filled 2016-03-18 (×8): qty 1

## 2016-03-18 SURGICAL SUPPLY — 52 items
AUTOTRANSFUS HAS 1/8 (MISCELLANEOUS) ×2
BLADE DEBAKEY 8.0 (BLADE) ×2 IMPLANT
BLADE SAGITTAL WIDE XTHICK NO (BLADE) ×2 IMPLANT
CANISTER SUCT 1200ML W/VALVE (MISCELLANEOUS) ×2 IMPLANT
CANISTER SUCT 3000ML (MISCELLANEOUS) ×2 IMPLANT
CAP KNEE TOTAL 3 SIGMA ×2 IMPLANT
CATH TRAY METER 16FR LF (MISCELLANEOUS) ×2 IMPLANT
CEMENT HV SMART SET (Cement) ×4 IMPLANT
CHLORAPREP W/TINT 26ML (MISCELLANEOUS) ×4 IMPLANT
COOLER POLAR GLACIER W/PUMP (MISCELLANEOUS) ×2 IMPLANT
CUFF TOURN 24 STER (MISCELLANEOUS) IMPLANT
CUFF TOURN 30 STER DUAL PORT (MISCELLANEOUS) ×2 IMPLANT
DECANTER SPIKE VIAL GLASS SM (MISCELLANEOUS) ×6 IMPLANT
DRAPE INCISE IOBAN 66X60 STRL (DRAPES) ×2 IMPLANT
DRAPE SHEET LG 3/4 BI-LAMINATE (DRAPES) ×2 IMPLANT
DRSG AQUACEL AG ADV 3.5X10 (GAUZE/BANDAGES/DRESSINGS) IMPLANT
DRSG AQUACEL AG ADV 3.5X14 (GAUZE/BANDAGES/DRESSINGS) ×2 IMPLANT
ELECT REM PT RETURN 9FT ADLT (ELECTROSURGICAL) ×2
ELECTRODE REM PT RTRN 9FT ADLT (ELECTROSURGICAL) ×1 IMPLANT
GLOVE INDICATOR 8.0 STRL GRN (GLOVE) ×4 IMPLANT
GLOVE SURG ORTHO 8.0 STRL STRW (GLOVE) ×2 IMPLANT
GLOVE SURG ORTHO 8.5 STRL (GLOVE) ×4 IMPLANT
GOWN STRL REUS W/ TWL LRG LVL3 (GOWN DISPOSABLE) ×3 IMPLANT
GOWN STRL REUS W/TWL LRG LVL3 (GOWN DISPOSABLE) ×3
HANDPIECE SUCTION TUBG SURGILV (MISCELLANEOUS) ×2 IMPLANT
IMMBOLIZER KNEE 19 BLUE UNIV (SOFTGOODS) ×2 IMPLANT
KIT RM TURNOVER STRD PROC AR (KITS) ×2 IMPLANT
NEEDLE 18GX1X1/2 (RX/OR ONLY) (NEEDLE) ×2 IMPLANT
NEEDLE SPNL 22GX3.5 SPINOC (NEEDLE) ×2 IMPLANT
NS IRRIG 1000ML POUR BTL (IV SOLUTION) ×2 IMPLANT
PACK TOTAL KNEE (MISCELLANEOUS) ×2 IMPLANT
PAD WRAPON POLAR KNEE (MISCELLANEOUS) ×1 IMPLANT
SOL .9 NS 3000ML IRR  AL (IV SOLUTION) ×1
SOL .9 NS 3000ML IRR UROMATIC (IV SOLUTION) ×1 IMPLANT
SPONGE LAP 18X18 5 PK (GAUZE/BANDAGES/DRESSINGS) ×2 IMPLANT
STAPLER SKIN PROX 35W (STAPLE) ×2 IMPLANT
SUCTION FRAZIER HANDLE 10FR (MISCELLANEOUS) ×1
SUCTION TUBE FRAZIER 10FR DISP (MISCELLANEOUS) ×1 IMPLANT
SUT BONE WAX W31G (SUTURE) ×2 IMPLANT
SUT DVC 2 QUILL PDO  T11 36X36 (SUTURE) ×1
SUT DVC 2 QUILL PDO T11 36X36 (SUTURE) ×1 IMPLANT
SUT ORTHOCORD OS-6 NDL 36 (SUTURE) ×2 IMPLANT
SUT QUILL PDO 0 36 36 VIOLET (SUTURE) ×2 IMPLANT
SUT VIC AB 0 CT1 36 (SUTURE) ×2 IMPLANT
SUT VIC AB 2-0 CT1 (SUTURE) ×2 IMPLANT
SYR 20CC LL (SYRINGE) ×4 IMPLANT
SYR 50ML LL SCALE MARK (SYRINGE) ×4 IMPLANT
SYSTEM AUTOTRANSFUS DUAL TROCR (MISCELLANEOUS) ×1 IMPLANT
TAPE MICROFOAM 4IN (TAPE) ×2 IMPLANT
TOWER CARTRIDGE SMART MIX (DISPOSABLE) ×2 IMPLANT
TUBE SUCT KAM VAC (TUBING) ×2 IMPLANT
WRAPON POLAR PAD KNEE (MISCELLANEOUS) ×2

## 2016-03-18 NOTE — Evaluation (Signed)
Physical Therapy Evaluation Patient Details Name: Shannon Chung MRN: [id number] DOB: [birth year] Today's Date: 03/18/2016   History of Present Illness  Pt is a pleasant 75 y/o male that presents for L TKR on 03/18/2016  Clinical Impression  Patient seen for POD#0 evaluation which he tolerated quite well. Apparently he was diaphoretic earlier, however he does not have any symptoms at the time of evaluation. He performs exceptionally well with all mobility and there-ex for this stage of his rehabilitation. He demonstrates decreased stride length bilaterally, it appears he is still rather cautious with WBing on replaced joint. He appears to be progressing well towards all PT mobility goals at this time and would continue to benefit from skilled PT services to address his mobility deficits listed below.     Follow Up Recommendations Home health PT    Equipment Recommendations       Recommendations for Other Services       Precautions / Restrictions Precautions Precautions: Knee Required Braces or Orthoses: Knee Immobilizer - Left Knee Immobilizer - Left: On when out of bed or walking Restrictions Weight Bearing Restrictions: Yes LLE Weight Bearing: Partial weight bearing LLE Partial Weight Bearing Percentage or Pounds: 50%      Mobility  Bed Mobility Overal bed mobility: Needs Assistance Bed Mobility: Supine to Sit     Supine to sit: HOB elevated;Supervision     General bed mobility comments: Patient educated to use hand rails and general technique to slide LEs out of bed, no assistance required from PT.   Transfers Overall transfer level: Needs assistance Equipment used: Rolling walker (2 wheeled) Transfers: Sit to/from Stand Sit to Stand: Supervision         General transfer comment: Patient educated in sliding LLE during transfer and leaning trunk forward, no deficits noted.   Ambulation/Gait Ambulation/Gait assistance: Min guard Ambulation Distance (Feet): 10  Feet Assistive device: Rolling walker (2 wheeled) Gait Pattern/deviations: Step-through pattern;Decreased step length - right;Decreased step length - left   Gait velocity interpretation: Below normal speed for age/gender General Gait Details: KI donned, patient able to take short steps with RW with cuing to press down through RW when advancing RLE. No buckling noted, appropriate technique demonstrated though decreased step length bilaterally.   Stairs            Wheelchair Mobility    Modified Rankin (Stroke Patients Only)       Balance Overall balance assessment: Needs assistance Sitting-balance support: Feet supported;No upper extremity supported Sitting balance-Leahy Scale: Good     Standing balance support: Bilateral upper extremity supported Standing balance-Leahy Scale: Good                               Pertinent Vitals/Pain Pain Assessment:  (Patient reports pain is currently minimal in his LLE)    Home Living Family/patient expects to be discharged to:: Private residence Living Arrangements: Spouse/significant other Available Help at Discharge: Family Type of Home: House Home Access: Level entry     Home Layout: One level Home Equipment: Environmental consultant - 2 wheels      Prior Function Level of Independence: Independent         Comments: Patient has been independent up until this admission     Hand Dominance        Extremity/Trunk Assessment               Lower Extremity Assessment: LLE deficits/detail   LLE Deficits /  Details: Able to complete 10 SLRs and heel slides without assistance from PT.      Communication   Communication: No difficulties  Cognition Arousal/Alertness: Awake/alert Behavior During Therapy: WFL for tasks assessed/performed Overall Cognitive Status: Within Functional Limits for tasks assessed                      General Comments General comments (skin integrity, edema, etc.): Drains, Tens unit in  place appropriately.     Exercises Total Joint Exercises Ankle Circles/Pumps: AROM;Both;10 reps Heel Slides: AROM;Both;10 reps Hip ABduction/ADduction: AROM;Both;10 reps Straight Leg Raises: AROM;Both;10 reps Goniometric ROM: 8-71      Assessment/Plan    PT Assessment Patient needs continued PT services  PT Diagnosis Difficulty walking   PT Problem List Decreased strength;Decreased knowledge of use of DME;Decreased range of motion;Decreased activity tolerance;Decreased balance  PT Treatment Interventions DME instruction;Gait training;Stair training;Therapeutic activities;Therapeutic exercise;Balance training   PT Goals (Current goals can be found in the Care Plan section) Acute Rehab PT Goals Patient Stated Goal: To increase his mobility.  PT Goal Formulation: With patient Time For Goal Achievement: 04/01/16 Potential to Achieve Goals: Good    Frequency BID   Barriers to discharge        Co-evaluation               End of Session Equipment Utilized During Treatment: Gait belt Activity Tolerance: Patient tolerated treatment well Patient left: in chair;with call bell/phone within reach;with chair alarm set Nurse Communication: Mobility status         Time: [id number] PT Time Calculation (min) (ACUTE ONLY): 31 min   Charges:   PT Evaluation $PT Eval Moderate Complexity: 1 Procedure PT Treatments $Therapeutic Exercise: 8-22 mins   PT G Codes:       Kerman Passey, PT, DPT    03/18/2016, 6:12 PM

## 2016-03-18 NOTE — Anesthesia Preprocedure Evaluation (Signed)
Anesthesia Evaluation  Patient identified by MRN, date of birth, ID band Patient awake    Reviewed: Allergy & Precautions, H&P , NPO status , Patient's Chart, lab work & pertinent test results  History of Anesthesia Complications Negative for: history of anesthetic complications  Airway Mallampati: III  TM Distance: >3 FB Neck ROM: limited    Dental  (+) Poor Dentition, Chipped, Caps, Loose   Pulmonary neg shortness of breath, former smoker,    Pulmonary exam normal breath sounds clear to auscultation       Cardiovascular Exercise Tolerance: Good hypertension, (-) angina+ CAD, + Past MI and + CABG  (-) DOE Normal cardiovascular exam Rhythm:regular Rate:Normal     Neuro/Psych negative neurological ROS  negative psych ROS   GI/Hepatic Neg liver ROS, GERD  Controlled,  Endo/Other  diabetes, Type 2  Renal/GU negative Renal ROS  negative genitourinary   Musculoskeletal  (+) Arthritis ,   Abdominal   Peds  Hematology negative hematology ROS (+)   Anesthesia Other Findings Past Medical History:   Skin cancer                                                    Comment:squamous and basal, sees derm regularly   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 06/2014: LV; resolved with coumadin-->no               residual on f/u echo, no longer on coumadin.   Ischemic cardiomyopathy                                        Comment:a. dilated, EF 35% improved to 45-50% (2015);                b. 07/2015 EF 25-35% by LV gram.   History of radiation exposure                                  Comment:right vocal cord squamous cell cancer   Essential hypertension                                       Dyslipidemia                                                 Lone atrial fibrillation ([place])                  1983        Comment:a. isolated episode, not on [place].   Vitamin D deficiency  Coronary artery disease                                        Comment:a. 06/2015 Cardiac CT: Ca score 1103 (84th               %'ile);  b. 07/2015 Cath: LM 70, LAD 80p,               100/3m, D1 70, D2 95, RI 75, RCA 100p/m;  c.               07/2015 CABG x 5 (LIMA->LAD, VG->Diag,               VG->OM1->OM2, VG->OM3).   Osteoarthritis                                                 Comment:a. R-shoulder, L-knee [facility])   Diabetes mellitus without complication ([place])    03/3663       Impingement syndrome of right shoulder          10/2015        Comment:s/p steroid injection Dr Sabra Heck   Facial basal cell cancer                        10/2015        Comment:L ala, pending MOHs ([place])   GERD (gastroesophageal reflux disease)                      Past Surgical History:   TONSILLECTOMY                                    1949         BICEPS TENDON REPAIR                            Right 1993         KNEE ARTHROSCOPY                                Left remote       CARDIAC CATHETERIZATION                         N/A 07/05/2015       Comment:Procedure: Left Heart Cath and Coronary               Angiography;  Surgeon: Minna Merritts, MD;                Location: [place] [facility];  Service:               Cardiovascular;  Laterality: N/A;   COLONOSCOPY                                      2007         CORONARY ARTERY BYPASS GRAFT  N/A 07/29/2015      Comment:Procedure: CORONARY ARTERY BYPASS GRAFTING               (CABG) x 5 (LIMA to LAD, SVG to DIAGONAL, SVG               SEQUENTIALLY to OM1 and OM2, SVG to OM3) with               Endoscopic Vein Havesting of GREATER SAPHENOUS              VEIN from RIGHT THIGH and partial LOWER LEG ;                Surgeon: [name], MD;  Location: [place] [facility];               Service: Open Heart Surgery;  Laterality: N/A;    TEE WITHOUT CARDIOVERSION                       N/A 07/29/2015      Comment:Procedure: TRANSESOPHAGEAL ECHOCARDIOGRAM               (TEE);  Surgeon: [name], MD;  Location:              [place];  Service: Open Heart Surgery;                Laterality: N/A;   SKIN CANCER EXCISION                             10/2015        Comment:BCC - L ala (pending MOHs) and L scapula               (complete excision)   Patient has cardiac clearance for this procedure.    Reproductive/Obstetrics negative OB ROS                             Anesthesia Physical Anesthesia Plan  ASA: III  Anesthesia Plan: Spinal   Post-op Pain Management:    Induction:   Airway Management Planned:   Additional Equipment:   Intra-op Plan:   Post-operative Plan:   Informed Consent: I have reviewed the patients History and Physical, chart, labs and discussed the procedure including the risks, benefits and alternatives for the proposed anesthesia with the patient or authorized representative who has indicated his/her understanding and acceptance.   Dental Advisory Given  Plan Discussed with: Anesthesiologist, CRNA and Surgeon  Anesthesia Plan Comments:         Anesthesia Quick Evaluation

## 2016-03-18 NOTE — H&P (Signed)
THE PATIENT WAS SEEN PRIOR TO SURGERY TODAY.  HISTORY, ALLERGIES, HOME MEDICATIONS AND OPERATIVE PROCEDURE WERE REVIEWED. RISKS AND BENEFITS OF SURGERY DISCUSSED WITH PATIENT AGAIN.  NO CHANGES FROM INITIAL HISTORY AND PHYSICAL NOTED.    

## 2016-03-18 NOTE — Anesthesia Procedure Notes (Signed)
Spinal Patient location during procedure: OR Staffing Anesthesiologist: Katy Fitch K Resident/CRNA: Nancy Arvin Performed by: anesthesiologist and resident/CRNA  Preanesthetic Checklist Completed: patient identified, site marked, surgical consent, pre-op evaluation, timeout performed, IV checked, risks and benefits discussed and monitors and equipment checked Spinal Block Patient position: sitting Prep: ChloraPrep Patient monitoring: heart rate, continuous pulse ox, blood pressure and cardiac monitor Approach: midline Location: L4-5 Injection technique: single-shot Needle Needle type: [place] and Introducer  Needle gauge: 25 G Needle length: 9 cm Assessment Sensory level: T6 Additional Notes Negative paresthesia. Negative blood return. Positive free-flowing CSF. Expiration date of kit checked and confirmed. Patient tolerated procedure well, without complications.

## 2016-03-18 NOTE — NC FL2 (Signed)
[place] LEVEL OF CARE SCREENING TOOL     IDENTIFICATION  Patient Name: Shannon Chung Birthdate: 1941-06-06 Sex: male Admission Date (Current Location): 03/18/2016  [place] and [place] Number:  Engineering geologist and Address:  [facility], [address] [place], [place] [address]      Provider Number: [id number]  Attending Physician Name and Address:  Earnestine Leys, MD  Relative Name and Phone Number:       Current Level of Care: Hospital Recommended Level of Care: [place] Prior Approval Number:    Date Approved/Denied:   PASRR Number:  ([id number])  Discharge Plan: SNF    Current Diagnoses: Patient Active Problem List   Diagnosis Date Noted  . Total knee replacement status 03/18/2016  . Preop cardiovascular exam 03/17/2016  . Anemia, unspecified 02/13/2016  . Medicare annual wellness visit, subsequent 10/21/2015  . Advanced care planning/counseling discussion 10/21/2015  . Coronary artery disease   . Prediabetes 08/12/2015  . Vitamin D deficiency   . History of radiation exposure   . 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 Ischemic cardiomyopathy 01/02/2015  . CAD (coronary artery disease), native coronary artery 01/02/2015  . Hyperlipidemia 01/02/2015  . Essential hypertension 01/02/2015  . Osteoarthritis 01/02/2015    Orientation RESPIRATION BLADDER Height & Weight     Self, Time, Situation, Place  Normal Continent Weight: 220 lb 12 oz (100.132 kg) Height:  6\' 1"  (185.4 cm)  BEHAVIORAL SYMPTOMS/MOOD NEUROLOGICAL BOWEL NUTRITION STATUS   (none )  (none) Continent Diet (Regular Diet )  AMBULATORY STATUS COMMUNICATION OF NEEDS Skin   Extensive Assist Verbally Surgical wounds (Incision: Left Knee )                       Personal Care Assistance Level of Assistance  Bathing, Feeding, Dressing Bathing Assistance: Limited assistance Feeding assistance: Independent Dressing  Assistance: Limited assistance     Functional Limitations Info  Sight, Hearing, Speech Sight Info: Adequate Hearing Info: Adequate Speech Info: Adequate    SPECIAL CARE FACTORS FREQUENCY  PT (By licensed PT), OT (By licensed OT)     PT Frequency:  (5) OT Frequency:  (5)            Contractures      Additional Factors Info  Code Status, Allergies Code Status Info:  (Full Code. ) Allergies Info:  (No Known Allergies. )           Current Medications (03/18/2016):  This is the current hospital active medication list Current Facility-Administered Medications  Medication Dose Route Frequency Provider Last Rate Last Dose  . 0.45 % sodium chloride infusion   Intravenous Continuous Earnestine Leys, MD 75 mL/hr at 03/18/16 1403    . 0.9 %  sodium chloride infusion   Intravenous Once Earnestine Leys, MD      . acetaminophen (TYLENOL) tablet 650 mg  650 mg Oral Q6H PRN Earnestine Leys, MD       Or  . acetaminophen (TYLENOL) suppository 650 mg  650 mg Rectal Q6H PRN Earnestine Leys, MD      . acetaminophen (TYLENOL) tablet 1,000 mg  1,000 mg Oral Q6H Earnestine Leys, MD   1,000 mg at 03/18/16 1358  . alum & mag hydroxide-simeth (MAALOX/MYLANTA) 200-200-20 MG/5ML suspension 30 mL  30 mL Oral Q4H PRN Earnestine Leys, MD      . atorvastatin (LIPITOR) tablet 40 mg  40 mg Oral Daily Earnestine Leys, MD      .  bisacodyl (DULCOLAX) suppository 10 mg  10 mg Rectal Daily PRN Earnestine Leys, MD      . celecoxib (CELEBREX) capsule 200 mg  200 mg Oral Q12H Earnestine Leys, MD   200 mg at 03/18/16 1354  . cholecalciferol (VITAMIN D) tablet 1,000 Units  1,000 Units Oral Daily Earnestine Leys, MD   1,000 Units at 03/18/16 1245  . diphenhydrAMINE (BENADRYL) 12.5 MG/5ML elixir 12.5-25 mg  12.5-25 mg Oral Q4H PRN Earnestine Leys, MD      . Derrill Memo ON 03/19/2016] enoxaparin (LOVENOX) injection 40 mg  40 mg Subcutaneous Q24H Earnestine Leys, MD      . famotidine (PEPCID) tablet 10 mg  10 mg Oral Daily Earnestine Leys, MD   10 mg  at 03/18/16 1359  . ferrous sulfate tablet 325 mg  325 mg Oral TID PC Earnestine Leys, MD   325 mg at 03/18/16 1300  . fluticasone (FLONASE) 50 MCG/ACT nasal spray 1 spray  1 spray Each Nare PRN Earnestine Leys, MD      . Derrill Memo ON 03/19/2016] lisinopril (PRINIVIL,ZESTRIL) tablet 2.5 mg  2.5 mg Oral Daily Earnestine Leys, MD      . magnesium hydroxide (MILK OF MAGNESIA) suspension 30 mL  30 mL Oral Daily PRN Earnestine Leys, MD      . menthol-cetylpyridinium (CEPACOL) lozenge 3 mg  1 lozenge Oral PRN Earnestine Leys, MD       Or  . phenol (CHLORASEPTIC) mouth spray 1 spray  1 spray Mouth/Throat PRN Earnestine Leys, MD      . methocarbamol (ROBAXIN) tablet 500 mg  500 mg Oral Q6H PRN Earnestine Leys, MD       Or  . methocarbamol (ROBAXIN) 500 mg in dextrose 5 % 50 mL IVPB  500 mg Intravenous Q6H PRN Earnestine Leys, MD      . metoCLOPramide (REGLAN) tablet 5-10 mg  5-10 mg Oral Q8H PRN Earnestine Leys, MD       Or  . metoCLOPramide (REGLAN) injection 5-10 mg  5-10 mg Intravenous Q8H PRN Earnestine Leys, MD      . Derrill Memo ON 03/19/2016] metoprolol succinate (TOPROL-XL) 24 hr tablet 25 mg  25 mg Oral Daily Earnestine Leys, MD      . morphine 2 MG/ML injection 2 mg  2 mg Intravenous Q2H PRN Earnestine Leys, MD      . ondansetron [facility]) tablet 4 mg  4 mg Oral Q6H PRN Earnestine Leys, MD       Or  . ondansetron [facility]) injection 4 mg  4 mg Intravenous Q6H PRN Earnestine Leys, MD      . oxyCODONE (Oxy IR/ROXICODONE) immediate release tablet 5-10 mg  5-10 mg Oral Q3H PRN Earnestine Leys, MD   5 mg at 03/18/16 1358  . [START ON 03/19/2016] pantoprazole (PROTONIX) EC tablet 40 mg  40 mg Oral Daily Earnestine Leys, MD      . pregabalin (LYRICA) capsule 75 mg  75 mg Oral BID Earnestine Leys, MD   75 mg at 03/18/16 1245  . senna (SENOKOT) tablet 8.6 mg  1 tablet Oral BID Earnestine Leys, MD   8.6 mg at 03/18/16 1359  . sodium phosphate (FLEET) 7-19 GM/118ML enema 1 enema  1 enema Rectal Once PRN Earnestine Leys, MD      . vancomycin (VANCOCIN)  1,500 mg in sodium chloride 0.9 % 500 mL IVPB  1,500 mg Intravenous Q12H Earnestine Leys, MD      . vitamin B-12 (CYANOCOBALAMIN) tablet 500 mcg  500 mcg Oral Daily  Earnestine Leys, MD   500 mcg at 03/18/16 1245  . zolpidem (AMBIEN) tablet 5 mg  5 mg Oral QHS PRN Earnestine Leys, MD         Discharge Medications: Please see discharge summary for a list of discharge medications.  Relevant Imaging Results:  Relevant Lab Results:   Additional Information  (SSN: [id number])  Loralyn Freshwater, LCSW

## 2016-03-18 NOTE — Progress Notes (Signed)
lovenox 30mg  q 24 hours was ordered. Pt CrCl based on scr in April 17 is 12ml/min. Per protocol lovenox was increased to 40mg  q 24 hours. Confirmation labs will be drawn in the AM prior to the first dose.  Ramond Dial, Pharm.D Clinical Pharmacist

## 2016-03-18 NOTE — Transfer of Care (Signed)
Immediate Anesthesia Transfer of Care Note  Patient: Shannon Chung  Procedure(s) Performed: Procedure(s): TOTAL KNEE ARTHROPLASTY (Left)  Patient Location: PACU  Anesthesia Type:Spinal  Level of Consciousness: sedated  Airway & Oxygen Therapy: Patient Spontanous Breathing and Patient connected to face mask oxygen  Post-op Assessment: Report given to RN and Post -op Vital signs reviewed and stable  Post vital signs: Reviewed and stable   Last Vitals: 1042 - 108/73 99% sat 16 resp 64 hr Filed Vitals:   03/18/16 0603  BP: 149/104  Pulse: 76  Temp: 37.3 C  Resp: 16    Last Pain:  Filed Vitals:   03/18/16 0610  PainSc: 2          Complications: No apparent anesthesia complications

## 2016-03-18 NOTE — Op Note (Signed)
DATE OF SURGERY:  03/18/2016 TIME: 10:36 AM  PATIENT NAME:  Shannon Chung   AGE: 75 y.o.    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  PREOPERATIVE INDICATIONS:  Shannon Chung is a 75 y.o. year old male with end stage bone on bone degenerative arthritis of the knee who failed conservative treatment, including injections, antiinflammatories, activity modification, and assistive devices, and had significant impairment of their activities of daily living, and elected for Total Knee Arthroplasty.   The risks, benefits, and alternatives were discussed at length including but not limited to the risks of infection, bleeding, nerve injury, stiffness, blood clots, the need for revision surgery, cardiopulmonary complications, among others, and they were willing to proceed.  OPERATIVE FINDINGS AND UNIQUE ASPECTS OF THE CASE:  Severe arthritis all compartments and large loose body  OPERATIVE DESCRIPTION:   The patient was brought to the operative room and placed in a supine position. Spinal anesthesia was administered. IV vancomycin antibiotics were given. The lower extremity was prepped and draped in the usual sterile fashion. Time out was performed. The leg was elevated and exsanguinated and the tourniquet was inflated to 350 mmHg  An anterior midline incision was made.  Anterior quadriceps tendon splitting approach was performed. The patella was everted and osteophytes were removed. The anterior horn of the medial and lateral meniscus was removed.  Then the extramedullary tibial cutting jig was utilized making the appropriate cut using the  anterior tibial crest as a reference building in appropriate posterior slope. Care was taken during the cut to protect the medial and collateral ligaments. The proximal tibia was removed along with the posterior horns of the menisci. The PCL was sacrificed.  The distal femur was sized as a large. Medial release was carried out. The anterior femoral cutting guide was aligned and centering hole made. The rotation guide was inserted and the anterior cutting guide pinned in place, and was in excellent alignment. The posterior femoral cuts were made. A 12.5 mm flexion gap was established. The distal femoral cutting guide was introduced at 4 of valgus. This was pinned and the distal femoral cut made. A 12.5 mm extension gap was established and was stable. The finishing guide was applied and finishing cuts made. The [place] retractor was inserted and the keeled #5 tibial trial was pinned in place. Centering hole was made and the keel inserted. The femoral component was inserted along with a 12.5 mm  insert and the knee articulated.  Extension and flexion showed good stability throughout. The patella was then sized and cut made for the patellar component. Centering holes were made. The trial was inserted and the knee articulated nicely with no need for lateral release. The trials were all removed and the knee thoroughly irrigated with pulsed lavage. Exparil was injected. The knee was dried and the cement mixed. The #5 keeled tibial component, large femoral component and patellar components were all cemented in place and excess cement was removed. The cement was allowed to harden for 10 minutes. Further irrigated. Further irrigation was carried out. Bone wax was applied to all raw bony surfaces. Autovac drains were inserted. Quarter percent Marcaine with epinephrine, Toradol and morphine  were injected. The capsule was closed with #2 Quill suture, and the subjacent tissues were closed with 0 Quill suture. The skin was  closed with staples.Sponge and needle counts were correct.  Aquacel dressing with TENS pads and a dry sterile dressing were applied. Polar Care and knee immobilizer were applied. Tourniquet was deflated with excellent return of blood flow to foot. Patient was transferred to a hospital bed and taken to the recovery room in good condition.  Park Breed, MD

## 2016-03-18 NOTE — Progress Notes (Signed)
Pt c/o "feeling funny" shortly after starting autologous transfusion.

## 2016-03-19 LAB — CBC
HEMATOCRIT: 33.3 % — AB (ref 40.0–52.0)
HEMOGLOBIN: 11.4 g/dL — AB (ref 13.0–18.0)
MCH: 33.6 pg (ref 26.0–34.0)
MCHC: 34.1 g/dL (ref 32.0–36.0)
MCV: 98.6 fL (ref 80.0–100.0)
Platelets: 100 10*3/uL — ABNORMAL LOW (ref 150–440)
RBC: 3.38 MIL/uL — ABNORMAL LOW (ref 4.40–5.90)
RDW: 14.2 % (ref 11.5–14.5)
WBC: 7.2 10*3/uL (ref 3.8–10.6)

## 2016-03-19 LAB — BASIC METABOLIC PANEL
Anion gap: 5 (ref 5–15)
BUN: 14 mg/dL (ref 6–20)
CHLORIDE: 104 mmol/L (ref 101–111)
CO2: 25 mmol/L (ref 22–32)
CREATININE: 0.93 mg/dL (ref 0.61–1.24)
Calcium: 8.1 mg/dL — ABNORMAL LOW (ref 8.9–10.3)
GFR calc Af Amer: >60 mL/min (ref >60)
GFR calc non Af Amer: >60 mL/min (ref >60)
GLUCOSE: 175 mg/dL — AB (ref 65–99)
Potassium: 4.1 mmol/L (ref 3.5–5.1)
Sodium: 134 mmol/L — ABNORMAL LOW (ref 135–145)

## 2016-03-19 NOTE — Progress Notes (Signed)
Physical Therapy Treatment Patient Details Name: Shannon Chung MRN: [id number] DOB: 1941/07/07 Today's Date: 03/19/2016    History of Present Illness Pt is a pleasant 75 y/o male that presents for L TKR on 03/18/2016    PT Comments    Pt agreeable to PT. Moderate pain noted in left knee that is tolerable to pt. Pt progressing well with all range and strength exercises. Pt/spouse thoroughly educated on stretching/strengthening exercises with carryover to home as well as safety measures to take in the home. Pt/spouse also educated on use of Left lower extremity with functional activities. Pt progressing ambulation quality and distance with increased instruction provided for proper step lengths, rolling walker placement and sequencing. Pt improves with continued practice; but continues slow and guarded with ambulation. Good use of bilateral upper extremities for compliance with left PWB status. Pt demonstrates excellent quad control at left knee with quad set, straight leg raise and ambulation. No buckling episodes with ambulation. Pt/spouse have questions regarding appropriate assistive device and when to advance. Encouraged continued use of rolling walker (and need for with PWB status) and to allow outpatient PT to educate on use and wean pt to single point cane when appropriate. Plan to see pt this afternoon for continued work on range of motion, strength, ambulation and endurance to improve all functional mobility for an optimal, safe discharge home post hospital stay.   Follow Up Recommendations  Outpatient PT     Equipment Recommendations  Other (comment) (pt has equipment needs)    Recommendations for Other Services       Precautions / Restrictions Precautions Precautions: Knee Required Braces or Orthoses: Knee Immobilizer - Left Knee Immobilizer - Left: On when out of bed or walking (Deferred use, as pt able to SLR with excellent control x10+ ) Restrictions Weight Bearing  Restrictions: Yes LLE Weight Bearing: Partial weight bearing LLE Partial Weight Bearing Percentage or Pounds: 50%    Mobility  Bed Mobility Overal bed mobility: Modified Independent Bed Mobility: Supine to Sit     Supine to sit: Modified independent (Device/Increase time) Sit to supine: Min guard   General bed mobility comments: Use of rail and increased time  Transfers Overall transfer level: Needs assistance Equipment used: Rolling walker (2 wheeled) Transfers: Sit to/from Stand Sit to Stand: Supervision         General transfer comment: Cues for proper hand placement and use of LLE  Ambulation/Gait Ambulation/Gait assistance: Min guard Ambulation Distance (Feet): 90 Feet Assistive device: Rolling walker (2 wheeled) Gait Pattern/deviations: Step-to pattern;Step-through pattern;Decreased stance time - left;Decreased stride length;Decreased dorsiflexion - left (L PWB with good use of UEs) Gait velocity: slow Gait velocity interpretation: <1.8 ft/sec, indicative of risk for recurrent falls General Gait Details: Slow, guarded with step to very small steps B; improved to partial step through maintaining PWB status on L. Cues for sequence and rw positioning. Rw adjusted for improved fit before ambulation.    Stairs            Wheelchair Mobility    Modified Rankin (Stroke Patients Only)       Balance Overall balance assessment: Needs assistance Sitting-balance support: No upper extremity supported;Feet supported Sitting balance-Leahy Scale: Good     Standing balance support: Bilateral upper extremity supported Standing balance-Leahy Scale: Fair                      Cognition Arousal/Alertness: Awake/alert Behavior During Therapy: WFL for tasks assessed/performed Overall Cognitive Status: Within Functional Limits  for tasks assessed                      Exercises Total Joint Exercises Ankle Circles/Pumps: AROM;Both;20 reps;Supine Quad  Sets: Strengthening;Both;20 reps;Supine (double ankle roll on L) Straight Leg Raises: AROM;Left;15 reps;Supine Knee Flexion: AAROM;Left;15 reps;Seated (3 postion each rep with 10 second hold each) Goniometric ROM: 0-89 Other Exercises Other Exercises: Pt spouse educated on carry over at home for exercises/stretching regarding technique, duration, frequency and progression regression Other Exercises: Pt/spouse education as well on appropriate assistive device and weaning with all questions answered    General Comments General comments (skin integrity, edema, etc.): TENS unit      Pertinent Vitals/Pain Pain Assessment: 0-10 Pain Score: 6  Pain Location: L knee Pain Descriptors / Indicators: Aching;Constant Pain Intervention(s): Monitored during session;Premedicated before session;Repositioned;Ice applied    Home Living Family/patient expects to be discharged to:: Private residence Living Arrangements: Spouse/significant other Available Help at Discharge: Family;Neighbor Type of Home: House Home Access: Level entry   Home Layout: One level Home Equipment: Environmental consultant - 2 wheels Additional Comments: Pt reports using tub/shower more than walk in shower due to "small" space in walk in, considering replacing current std height toilet with elevated toilet    Prior Function Level of Independence: Independent      Comments: Patient has been independent up until this admission; continues to drive, no longer working    PT Goals (current goals can now be found in the care plan section) Acute Rehab PT Goals Patient Stated Goal: To go home Progress towards PT goals: Progressing toward goals    Frequency  BID    PT Plan Discharge plan needs to be updated    Co-evaluation             End of Session Equipment Utilized During Treatment: Gait belt Activity Tolerance: Patient tolerated treatment well Patient left: in chair;with call bell/phone within reach;with chair alarm set;with  family/visitor present;Other (comment) (polar care in place)     Time: 1112-1200 PT Time Calculation (min) (ACUTE ONLY): 48 min  Charges:  $Gait Training: 8-22 mins $Therapeutic Exercise: 23-37 mins                    G Codes:      Charlaine Dalton, PTA 03/19/2016, 1:07 PM

## 2016-03-19 NOTE — Progress Notes (Signed)
Physical Therapy Treatment Patient Details Name: Shannon Chung MRN: [id number] DOB: Jul 02, 1941 Today's Date: 03/19/2016    History of Present Illness Pt is a pleasant 75 y/o male that presents for L TKR on 03/18/2016    PT Comments    Brief treatment of education and reposition, as pt notes increased pain post morning session and sitting up in chair. Pt sat with legs in full dependent position post lunch versus placing back in knee extension as previously instructed. Pt questioned whether he was sitting with left knee flexed to about 90 degrees or sitting with left leg only partially flexed and was found to be the latter. Educated pt on open verus closed knee joint position and use for joint swelling management. Pt now has an improved understanding. Pt also educated on true elevation for swelling management; pt repositioned to lower head and place second towel roll under ankle for improved elevation, as well as extension of left knee. Pt instructed to perform ankle pumps as well. Re instruction regarding progression/regression of exercise and activity based on pain/symptoms noting that pt can always perform some level of exercise/activity to promote healing and improved function. No further treatment attempted this session due to pain; pt notes he is currently awaiting morphine for improved pain relief. Continue PT to progress range, strength and all functional mobility   Follow Up Recommendations  Outpatient PT     Equipment Recommendations  Other (comment) (pt has equipment needs)    Recommendations for Other Services       Precautions / Restrictions Precautions Precautions: Knee Required Braces or Orthoses: Knee Immobilizer - Left Knee Immobilizer - Left: On when out of bed or walking (Deferred use, as pt able to SLR with excellent control x10+ ) Restrictions Weight Bearing Restrictions: Yes LLE Weight Bearing: Partial weight bearing LLE Partial Weight Bearing Percentage or Pounds:  50%    Mobility  Bed Mobility Overal bed mobility: Modified Independent Bed Mobility: Supine to Sit     Supine to sit: Modified independent (Device/Increase time)     General bed mobility comments: Not tested; pt back in bed with increased pain post morning session/up in chair  Transfers Overall transfer level: Needs assistance Equipment used: Rolling walker (2 wheeled) Transfers: Sit to/from Stand Sit to Stand: Supervision         General transfer comment: Cues for proper hand placement and use of LLE  Ambulation/Gait Ambulation/Gait assistance: Min guard Ambulation Distance (Feet): 90 Feet Assistive device: Rolling walker (2 wheeled) Gait Pattern/deviations: Step-to pattern;Step-through pattern;Decreased stance time - left;Decreased stride length;Decreased dorsiflexion - left (L PWB with good use of UEs) Gait velocity: slow Gait velocity interpretation: <1.8 ft/sec, indicative of risk for recurrent falls General Gait Details: Slow, guarded with step to very small steps B; improved to partial step through maintaining PWB status on L. Cues for sequence and rw positioning. Rw adjusted for improved fit before ambulation.    Stairs            Wheelchair Mobility    Modified Rankin (Stroke Patients Only)       Balance Overall balance assessment: Needs assistance Sitting-balance support: No upper extremity supported;Feet supported Sitting balance-Leahy Scale: Good     Standing balance support: Bilateral upper extremity supported Standing balance-Leahy Scale: Fair                      Cognition Arousal/Alertness: Awake/alert Behavior During Therapy: WFL for tasks assessed/performed Overall Cognitive Status: Within Functional Limits for tasks assessed  Exercises Total Joint Exercises Ankle Circles/Pumps: Other (comment);AROM;20 reps (reviewed use of this with heart below level of leg) Quad Sets: Other (comment) (reviewed  verbally to continue when pain eases) Straight Leg Raises: AROM;Left;15 reps;Supine Knee Flexion: AAROM;Left;15 reps;Seated (3 postion each rep with 10 second hold each) Goniometric ROM: 0-89 Other Exercises Other Exercises: Pt education on open versus closed position of knee joint for swelling management in joint Other Exercises: Pt/spouse education as well on appropriate assistive device and weaning with all questions answered    General Comments General comments (skin integrity, edema, etc.): TENS unit      Pertinent Vitals/Pain Pain Assessment: 0-10 Pain Score: 8  Pain Location: L knee Pain Descriptors / Indicators: Constant;Pressure;Throbbing;Tightness Pain Intervention(s): Monitored during session;Premedicated before session;Repositioned;Ice applied    Home Living                      Prior Function            PT Goals (current goals can now be found in the care plan section) Progress towards PT goals: Progressing toward goals    Frequency  BID    PT Plan Discharge plan needs to be updated    Co-evaluation             End of Session Equipment Utilized During Treatment: Gait belt Activity Tolerance: Patient tolerated treatment well Patient left: in chair;with call bell/phone within reach;with chair alarm set;with family/visitor present;Other (comment) (polar care in place)     Time: [id number] PT Time Calculation (min) (ACUTE ONLY): 9 min  Charges:  $Gait Training: 8-22 mins $Therapeutic Exercise: 23-37 mins $Therapeutic Activity: 8-22 mins                    G Codes:      Charlaine Dalton, PTA 03/19/2016, 3:11 PM

## 2016-03-19 NOTE — Progress Notes (Signed)
Subjective: 1 Day Post-Op Procedure(s) (LRB): TOTAL KNEE ARTHROPLASTY (Left)    Patient reports pain as mild. Did well last night with minimal discomfort. Drain removed. Hemoglobin stable. Dressing dry.  Objective:   VITALS:   Filed Vitals:   03/19/16 0335 03/19/16 0826  BP: 108/71 132/74  Pulse: 60 68  Temp: 98.2 F (36.8 C) 98 F (36.7 C)  Resp: 18 18    Neurologically intact ABD soft Neurovascular intact Sensation intact distally Intact pulses distally Dorsiflexion/Plantar flexion intact  LABS  Recent Labs  03/18/16 1309 03/19/16 0314  HGB 13.3 11.4*  HCT 40.2 33.3*  WBC 7.8 7.2  PLT 135* 100*     Recent Labs  03/18/16 1309 03/19/16 0314  NA  --  134*  K  --  4.1  BUN  --  14  CREATININE 0.80 0.93  GLUCOSE  --  175*    No results for input(s): LABPT, INR in the last 72 hours.   Assessment/Plan: 1 Day Post-Op Procedure(s) (LRB): TOTAL KNEE ARTHROPLASTY (Left)   Advance diet Up with therapy D/C IV fluids

## 2016-03-19 NOTE — Anesthesia Postprocedure Evaluation (Signed)
Anesthesia Post Note  Patient: Shannon Chung  Procedure(s) Performed: Procedure(s) (LRB): TOTAL KNEE ARTHROPLASTY (Left)  Patient location during evaluation: Other Anesthesia Type: Spinal Level of consciousness: oriented and awake and alert Pain management: pain level controlled Vital Signs Assessment: post-procedure vital signs reviewed and stable Respiratory status: spontaneous breathing, respiratory function stable and patient connected to nasal cannula oxygen Cardiovascular status: blood pressure returned to baseline and stable Postop Assessment: no headache and no backache Anesthetic complications: no    Last Vitals:  Filed Vitals:   03/18/16 2115 03/19/16 0335  BP: 114/69 108/71  Pulse: 54 60  Temp: 36.4 C 36.8 C  Resp: 18 18    Last Pain:  Filed Vitals:   03/19/16 0643  PainSc: 2                  Alison Stalling

## 2016-03-19 NOTE — Progress Notes (Signed)
Pt Placed on CPM

## 2016-03-19 NOTE — Care Management Important Message (Signed)
Important Message  Patient Details  Name: Shannon Chung MRN: [id number] Date of Birth: Mar 24, 1941   Medicare Important Message Given:  Yes    Burnis Halling A, RN 03/19/2016, 6:59 AM

## 2016-03-19 NOTE — Progress Notes (Signed)
Clinical Social Worker (CSW) received SNF consult. PT is recommending home health. RN Case Manager is aware of above. Please reconsult if future social work needs arise. CSW signing off.   Jacy Brocker Morgan, LCSW [phone] 

## 2016-03-19 NOTE — Evaluation (Signed)
Occupational Therapy Evaluation Patient Details Name: Shannon Chung MRN: [id number] DOB: 03/20/1941 Today's Date: 03/19/2016    History of Present Illness Pt is a pleasant 75 y/o male that presents for L TKR on 03/18/2016   Clinical Impression   Pt is 75 year old male s/p L TKR.  Pt was independent in all ADLs and driving prior to surgery and is eager to return to PLOF.  Pt currently requires supervision and verbal cues for don/doffing socks using AE while in seated position due to pain and limited AROM of L knee.  Pt would benefit from instruction in LB dressing techniques with or without assistive devices for dressing and bathing skills.  Pt would also benefit from recommendations for home modifications to increase safety in the bathroom and prevent falls as well as outpatient OT.      Follow Up Recommendations  Outpatient OT    Equipment Recommendations  3 in 1 bedside comode;Tub/shower seat    Recommendations for Other Services       Precautions / Restrictions Precautions Precautions: Knee Required Braces or Orthoses: Knee Immobilizer - Left Knee Immobilizer - Left: On when out of bed or walking Restrictions Weight Bearing Restrictions: Yes LLE Weight Bearing: Partial weight bearing LLE Partial Weight Bearing Percentage or Pounds: 50%      Mobility Bed Mobility Overal bed mobility: Needs Assistance Bed Mobility: Supine to Sit;Sit to Supine     Supine to sit: HOB elevated;Supervision Sit to supine: Min guard   General bed mobility comments: Pt required VC for use of hand rails for sit>sup and Min guard for LLE for sliding  Transfers                      Balance Overall balance assessment: Needs assistance Sitting-balance support: No upper extremity supported;Feet supported Sitting balance-Leahy Scale: Good                                      ADL Overall ADL's : Needs assistance/impaired     Grooming: Oral care;Set up;Bed level                Lower Body Dressing: With adaptive equipment;Cueing for compensatory techniques;Sitting/lateral leans;Set up Lower Body Dressing Details (indicate cue type and reason): Pt able to don/doff socks using reacher and sock aid after set up, instruction for AE, and Min VC for techniques                     Vision Vision Assessment?: No apparent visual deficits   Perception     Praxis      Pertinent Vitals/Pain Pain Assessment: 0-10 Pain Score: 6  Pain Location: L knee, 5/10 at rest, 6/10 with sup>sit t/f Pain Descriptors / Indicators: Aching Pain Intervention(s): Limited activity within patient's tolerance;Monitored during session;RN gave pain meds during session;Ice applied     Hand Dominance Right   Extremity/Trunk Assessment Upper Extremity Assessment Upper Extremity Assessment: Overall WFL for tasks assessed   Lower Extremity Assessment Lower Extremity Assessment: Defer to PT evaluation   Cervical / Trunk Assessment Cervical / Trunk Assessment: Normal   Communication Communication Communication: No difficulties   Cognition Arousal/Alertness: Awake/alert Behavior During Therapy: WFL for tasks assessed/performed Overall Cognitive Status: Within Functional Limits for tasks assessed                     General Comments  Exercises       Shoulder Instructions      Home Living Family/patient expects to be discharged to:: Private residence Living Arrangements: Spouse/significant other Available Help at Discharge: Family;Neighbor Type of Home: House Home Access: Level entry     Home Layout: One level     Bathroom Shower/Tub: Tub/shower unit;Walk-in shower;Curtain Shower/tub characteristics: Architectural technologist: Standard     Home Equipment: Environmental consultant - 2 wheels   Additional Comments: Pt reports using tub/shower more than walk in shower due to "small" space in walk in, considering replacing current std height toilet with  elevated toilet      Prior Functioning/Environment Level of Independence: Independent        Comments: Patient has been independent up until this admission; continues to drive, no longer working     OT Diagnosis: Acute pain   OT Problem List: Decreased range of motion;Decreased activity tolerance;Impaired balance (sitting and/or standing);Decreased knowledge of use of DME or AE;Pain;Decreased strength   OT Treatment/Interventions: Therapeutic exercise;Energy conservation;DME and/or AE instruction;Self-care/ADL training;Patient/family education    OT Goals(Current goals can be found in the care plan section) Acute Rehab OT Goals Patient Stated Goal: To go home OT Goal Formulation: With patient Time For Goal Achievement: 04/02/16 Potential to Achieve Goals: Good  OT Frequency: Min 1X/week   Barriers to D/C:            Co-evaluation      OT Treatment            In addition to OT assessment, OT treatment provided. Pt instructed in use of AE for LB dressing including reacher and sock aid to don/doff socks with verbal and visual instructions. Pt was able to return demonstrate use of AE with supervision, Min verbal cues for technique, and additional time to complete.     End of Session CPM Left Knee CPM Left Knee: Off Nurse Communication: Other (comment) Nsg notified of wound vac leaking  Activity Tolerance: Patient tolerated treatment well;Patient limited by pain Patient left: in bed;with call bell/phone within reach;with bed alarm set   Time: [id number] OT Time Calculation (min): 31 min Charges:  OT General Charges $OT Visit: 1 Procedure OT Evaluation $OT Eval Low Complexity: 1 Procedure OT Treatments $Self Care/Home Management : 8-22 mins G-Codes:    Corky Sox, OTR/L 03/19/2016, 10:44 AM

## 2016-03-20 LAB — CBC
HCT: 31.2 % — ABNORMAL LOW (ref 40.0–52.0)
Hemoglobin: 10.7 g/dL — ABNORMAL LOW (ref 13.0–18.0)
MCH: 33.4 pg (ref 26.0–34.0)
MCHC: 34.2 g/dL (ref 32.0–36.0)
MCV: 97.6 fL (ref 80.0–100.0)
PLATELETS: 103 10*3/uL — AB (ref 150–440)
RBC: 3.19 MIL/uL — ABNORMAL LOW (ref 4.40–5.90)
RDW: 13.9 % (ref 11.5–14.5)
WBC: 7.7 10*3/uL (ref 3.8–10.6)

## 2016-03-20 NOTE — Progress Notes (Signed)
Subjective: 2 Days Post-Op Procedure(s) (LRB): TOTAL KNEE ARTHROPLASTY (Left)    Patient reports pain as mild. Working with PT but not independent yet.  Dressing dry.   Objective:   VITALS:   Filed Vitals:   03/20/16 0430 03/20/16 0742  BP: 120/75 124/65  Pulse: 75 70  Temp: 98.8 F (37.1 C) 98.3 F (36.8 C)  Resp: 19 18    Neurologically intact ABD soft Neurovascular intact Sensation intact distally Intact pulses distally Dorsiflexion/Plantar flexion intact No cellulitis present  LABS  Recent Labs  03/18/16 1309 03/19/16 0314 03/20/16 0439  HGB 13.3 11.4* 10.7*  HCT 40.2 33.3* 31.2*  WBC 7.8 7.2 7.7  PLT 135* 100* 103*     Recent Labs  03/18/16 1309 03/19/16 0314  NA  --  134*  K  --  4.1  BUN  --  14  CREATININE 0.80 0.93  GLUCOSE  --  175*    No results for input(s): LABPT, INR in the last 72 hours.   Assessment/Plan: 2 Days Post-Op Procedure(s) (LRB): TOTAL KNEE ARTHROPLASTY (Left)   Advance diet Up with therapy Plan for discharge tomorrow

## 2016-03-20 NOTE — Progress Notes (Signed)
Physical Therapy Treatment Patient Details Name: Shannon Chung MRN: [id number] DOB: 13-Sep-1941 Today's Date: 03/20/2016    History of Present Illness Pt is a pleasant 75 y/o male that presents for L TKR on 03/18/2016    PT Comments    Pt in bed ready for session.  Participated in supine and seated exercises as described below.  Pt was able to get to edge of bed with rail and supervision managing his on LLE.  He ambulated 140' with walker and step to [facility] gait pattern with KI in place.  Verbal cues at times to increase safety with gait pattern.     Follow Up Recommendations  Outpatient PT     Equipment Recommendations       Recommendations for Other Services       Precautions / Restrictions Precautions Precautions: Knee Required Braces or Orthoses: Knee Immobilizer - Left Knee Immobilizer - Left: On when out of bed or walking Restrictions Weight Bearing Restrictions: Yes LLE Weight Bearing: Partial weight bearing LLE Partial Weight Bearing Percentage or Pounds: 50%    Mobility  Bed Mobility Overal bed mobility: Modified Independent Bed Mobility: Supine to Sit     Supine to sit: Modified independent (Device/Increase time) Sit to supine: Min guard      Transfers Overall transfer level: Needs assistance Equipment used: Rolling walker (2 wheeled) Transfers: Sit to/from Stand Sit to Stand: Supervision         General transfer comment: Cues for proper hand placement and use of LLE  Ambulation/Gait Ambulation/Gait assistance: Min guard Ambulation Distance (Feet): 140 Feet Assistive device: Rolling walker (2 wheeled) Gait Pattern/deviations: Step-to pattern;Decreased stance time - left Gait velocity: slow Gait velocity interpretation: <1.8 ft/sec, indicative of risk for recurrent falls General Gait Details: Slow, guarded with step to very small steps B; improved to partial step through maintaining PWB status on L. Cues for sequence and rw positioning. Rw adjusted  for improved fit before ambulation.    Stairs            Wheelchair Mobility    Modified Rankin (Stroke Patients Only)       Balance Overall balance assessment: Needs assistance Sitting-balance support: No upper extremity supported Sitting balance-Leahy Scale: Good     Standing balance support: Bilateral upper extremity supported Standing balance-Leahy Scale: Fair                      Cognition Arousal/Alertness: Awake/alert Behavior During Therapy: WFL for tasks assessed/performed Overall Cognitive Status: Within Functional Limits for tasks assessed                      Exercises Total Joint Exercises Ankle Circles/Pumps: Other (comment);AROM;20 reps Quad Sets: AROM;Both;20 reps;Supine Short Arc Quad: AROM;Left;20 reps;Supine Heel Slides: AROM;Left;20 reps;Supine Straight Leg Raises: AAROM;Left;20 reps;Supine Long Arc Quad: AROM;Left;20 reps;Seated Knee Flexion: AAROM;Left;10 reps;Seated Goniometric ROM: 89    General Comments General comments (skin integrity, edema, etc.): TENS (reviewed use )      Pertinent Vitals/Pain Pain Assessment: 0-10 Pain Score: 4  Pain Location: L knee Pain Descriptors / Indicators: Sore Pain Intervention(s): Monitored during session    Home Living                      Prior Function            PT Goals (current goals can now be found in the care plan section) Progress towards PT goals: Progressing toward goals  Frequency  BID    PT Plan      Co-evaluation             End of Session Equipment Utilized During Treatment: Gait belt Activity Tolerance: Patient tolerated treatment well Patient left: in chair;with call bell/phone within reach;with chair alarm set;with family/visitor present;Other (comment)     Time: [id number] PT Time Calculation (min) (ACUTE ONLY): 41 min  Charges:  $Gait Training: 8-22 mins $Therapeutic Exercise: 8-22 mins                    G Codes:       Chesley Noon, PTA 03/20/2016, 10:35 AM

## 2016-03-20 NOTE — Progress Notes (Signed)
Physical Therapy Treatment Patient Details Name: Metthew Minard MRN: [id number] DOB: May 18, 1941 Today's Date: 03/20/2016    History of Present Illness Pt is a pleasant 75 y/o male that presents for L TKR on 03/18/2016    PT Comments    Pt in bed ready for session.  To edge of bed with min guard and rail.  Pt was able to ambulate around nursing unit with min guard and step to gait pattern PWB with knee immobilizer in place.  Pt without loss of balance.  Upon return to room pt participated in exercises as described below.  D/C planned for am, does not have stairs to get in/out of house.   Follow Up Recommendations  Outpatient PT     Equipment Recommendations       Recommendations for Other Services       Precautions / Restrictions Precautions Precautions: Knee Required Braces or Orthoses: Knee Immobilizer - Left Knee Immobilizer - Left: On when out of bed or walking Restrictions Weight Bearing Restrictions: Yes LLE Weight Bearing: Partial weight bearing LLE Partial Weight Bearing Percentage or Pounds: 50%    Mobility  Bed Mobility Overal bed mobility: Modified Independent Bed Mobility: Supine to Sit     Supine to sit: Modified independent (Device/Increase time) Sit to supine: Min guard      Transfers Overall transfer level: Needs assistance Equipment used: Rolling walker (2 wheeled) Transfers: Sit to/from Stand Sit to Stand: Supervision         General transfer comment: Cues for proper hand placement and use of LLE  Ambulation/Gait Ambulation/Gait assistance: Min guard Ambulation Distance (Feet): 160 Feet Assistive device: Rolling walker (2 wheeled) Gait Pattern/deviations: Step-to pattern Gait velocity: slow Gait velocity interpretation: <1.8 ft/sec, indicative of risk for recurrent falls General Gait Details: increased speed this pm but still slow and cautious   Stairs            Wheelchair Mobility    Modified Rankin (Stroke Patients Only)       Balance Overall balance assessment: Needs assistance Sitting-balance support: Feet supported;No upper extremity supported Sitting balance-Leahy Scale: Good     Standing balance support: Bilateral upper extremity supported Standing balance-Leahy Scale: Fair                      Cognition Arousal/Alertness: Awake/alert Behavior During Therapy: WFL for tasks assessed/performed Overall Cognitive Status: Within Functional Limits for tasks assessed                      Exercises Total Joint Exercises Ankle Circles/Pumps: Other (comment);AROM;20 reps Straight Leg Raises: AAROM;Left;20 reps;Supine Long Arc Quad: AROM;Left;20 reps;Seated Knee Flexion: AAROM;Left;10 reps;Seated Goniometric ROM: 0-90    General Comments General comments (skin integrity, edema, etc.): TENS unit      Pertinent Vitals/Pain Pain Assessment: 0-10 Pain Score: 4  Pain Location: L knee Pain Descriptors / Indicators: Sore Pain Intervention(s): Monitored during session;Ice applied;Patient requesting pain meds-RN notified    Home Living                      Prior Function            PT Goals (current goals can now be found in the care plan section) Progress towards PT goals: Progressing toward goals    Frequency  BID    PT Plan      Co-evaluation             End of Session  Equipment Utilized During Treatment: Gait belt Activity Tolerance: Patient tolerated treatment well Patient left: with call bell/phone within reach;with chair alarm set;with family/visitor present;Other (comment);in bed     Time: 1450-1513 PT Time Calculation (min) (ACUTE ONLY): 23 min  Charges:  $Gait Training: 8-22 mins $Therapeutic Exercise: 8-22 mins                    G Codes:      Chesley Noon, PTA 03/20/2016, 4:07 PM

## 2016-03-21 LAB — CBC
HEMATOCRIT: 29.9 % — AB (ref 40.0–52.0)
HEMOGLOBIN: 10.2 g/dL — AB (ref 13.0–18.0)
MCH: 33 pg (ref 26.0–34.0)
MCHC: 34.1 g/dL (ref 32.0–36.0)
MCV: 96.8 fL (ref 80.0–100.0)
Platelets: 106 10*3/uL — ABNORMAL LOW (ref 150–440)
RBC: 3.09 MIL/uL — ABNORMAL LOW (ref 4.40–5.90)
RDW: 14.3 % (ref 11.5–14.5)
WBC: 7.3 10*3/uL (ref 3.8–10.6)

## 2016-03-21 MED ORDER — GABAPENTIN 400 MG PO CAPS: 400.0000 mg | ORAL_CAPSULE | Freq: Two times a day (BID) | ORAL | Status: DC

## 2016-03-21 MED ORDER — HYDROCODONE-ACETAMINOPHEN 7.5-325 MG PO TABS: 1.0000 | ORAL_TABLET | Freq: Four times a day (QID) | ORAL | Status: DC | PRN

## 2016-03-21 MED ORDER — ASPIRIN EC 325 MG PO TBEC: 325.0000 mg | DELAYED_RELEASE_TABLET | Freq: Two times a day (BID) | ORAL | Status: DC

## 2016-03-21 MED ORDER — FERROUS SULFATE 325 (65 FE) MG PO TABS: 325.0000 mg | ORAL_TABLET | Freq: Every day | ORAL | Status: DC

## 2016-03-21 MED ORDER — METHOCARBAMOL 500 MG PO TABS: 500.0000 mg | ORAL_TABLET | Freq: Four times a day (QID) | ORAL | Status: DC | PRN

## 2016-03-21 NOTE — Progress Notes (Signed)
Patient discharged home. DC instructions provided and explained. Medications reviewed. Rx given. All questions answered. Stable at discharge.Marland Kitchen

## 2016-03-21 NOTE — Plan of Care (Signed)
Problem: Physical Regulation: Goal: Ability to maintain clinical measurements within normal limits will improve Outcome: Adequate for Discharge Up to BR with supervision   Problem: Pain Management: Goal: Pain level will decrease with appropriate interventions Outcome: Adequate for Discharge Pain controlled with PRN medication  Problem: Skin Integrity: Goal: Signs of wound healing will improve Outcome: Adequate for Discharge Dressing changed 03/20/16 prior to shift. Dressing CDI

## 2016-03-21 NOTE — Discharge Summary (Signed)
Physician Discharge Summary  Patient ID: Shannon Chung MRN: [id number] DOB/AGE: 1940-12-12 75 y.o.  Admit date: 03/18/2016 Discharge date: 03/21/2016  Admission Diagnoses: Severe arthritis left knee  Discharge Diagnoses:  Active Problems:   Total knee replacement status   Discharged Condition: good  Hospital Course: Left TKA done without complication.  Did well after surgery without problems.  Hgb stable.  Good progress with PT.  Wound benign.   Consults: None  Significant Diagnostic Studies: radiology: X-Ray: left knee satisfactory  Treatments: therapies: PT  Discharge Exam: Blood pressure 121/76, pulse 72, temperature 98.4 F (36.9 C), temperature source Oral, resp. rate 20, height 6\' 1"  (1.854 m), weight 100.132 kg (220 lb 12 oz), SpO2 94 %. Extremities: extremities normal, atraumatic, no cyanosis or edema, Homans sign is negative, no sign of DVT, no edema, redness or tenderness in the calves or thighs and dressing dry  Disposition: 01-Home or Self Care  Discharge Instructions    Call MD for:  persistant nausea and vomiting    Complete by:  As directed      Call MD for:  redness, tenderness, or signs of infection (pain, swelling, redness, odor or green/yellow discharge around incision site)    Complete by:  As directed      Call MD for:  severe uncontrolled pain    Complete by:  As directed      Call MD for:  temperature >100.4    Complete by:  As directed      Diet - low sodium heart healthy    Complete by:  As directed      Increase activity slowly    Complete by:  As directed      Leave dressing on - Keep it clean, dry, and intact until clinic visit    Complete by:  As directed             Medication List    STOP taking these medications        meloxicam 15 MG tablet  Commonly known as:  MOBIC      TAKE these medications        acetaminophen 500 MG tablet  Commonly known as:  TYLENOL  Take 500 mg by mouth every 6 (six) hours as needed for mild pain.      aspirin EC 325 MG tablet  Take 1 tablet (325 mg total) by mouth 2 (two) times daily.     atorvastatin 40 MG tablet  Commonly known as:  LIPITOR  Take 1 tablet (40 mg total) by mouth daily.     ferrous sulfate 325 (65 FE) MG tablet  Take 1 tablet (325 mg total) by mouth daily with breakfast.     fluticasone 50 MCG/ACT nasal spray  Commonly known as:  FLONASE  Place 1 spray into both nostrils as needed for allergies. Reported on 03/18/2016     gabapentin 400 MG capsule  Commonly known as:  NEURONTIN  Take 1 capsule (400 mg total) by mouth 2 (two) times daily.     HYDROcodone-acetaminophen 7.5-325 MG tablet  Commonly known as:  NORCO  Take 1-2 tablets by mouth every 6 (six) hours as needed for moderate pain.     lansoprazole 30 MG capsule  Commonly known as:  PREVACID  Take 1 capsule (30 mg total) by mouth every morning.     lisinopril 2.5 MG tablet  Commonly known as:  PRINIVIL,ZESTRIL  Take 1 tablet (2.5 mg total) by mouth daily.     methocarbamol 500  MG tablet  Commonly known as:  ROBAXIN  Take 1 tablet (500 mg total) by mouth every 6 (six) hours as needed for muscle spasms.     metoprolol succinate 25 MG 24 hr tablet  Commonly known as:  TOPROL-XL  Take 1 tablet (25 mg total) by mouth daily.     ranitidine 150 MG tablet  Commonly known as:  ZANTAC  Take 1 tablet (150 mg total) by mouth at bedtime.     vitamin B-12 500 MCG tablet  Commonly known as:  CYANOCOBALAMIN  Take 500 mcg by mouth daily. Reported on 03/17/2016     Vitamin D3 1000 units Caps  Take 1 capsule (1,000 Units total) by mouth daily.         Signed: Park Breed 03/21/2016, 12:33 PM

## 2016-03-21 NOTE — Progress Notes (Signed)
Physical Therapy Treatment Patient Details Name: Shannon Chung MRN: [id number] DOB: [birth year] Today's Date: 03/21/2016    History of Present Illness Pt is a pleasant 75 y/o male that presents for L TKR on 03/18/2016    PT Comments    Lengthy session to wrap up care with review of exercises, trip to BR, stair training and completed questions for pt.  He is prepared for outpatient follow up and has 24/7 care arranged for home.  Follow Up Recommendations  Outpatient PT     Equipment Recommendations  None recommended by PT    Recommendations for Other Services Rehab consult     Precautions / Restrictions Precautions Precautions: Knee Required Braces or Orthoses: Knee Immobilizer - Left Knee Immobilizer - Left: On when out of bed or walking Restrictions Weight Bearing Restrictions: Yes LLE Weight Bearing: Partial weight bearing LLE Partial Weight Bearing Percentage or Pounds: 50%    Mobility  Bed Mobility Overal bed mobility: Modified Independent Bed Mobility: Supine to Sit;Sit to Supine              Transfers Overall transfer level: Needs assistance Equipment used: Rolling walker (2 wheeled) Transfers: Sit to/from [facility] Sit to Stand: Supervision Stand pivot transfers: Supervision          Ambulation/Gait Ambulation/Gait assistance: Min guard;Supervision Ambulation Distance (Feet): 200 Feet Assistive device: Rolling walker (2 wheeled) Gait Pattern/deviations: Step-to pattern;Step-through pattern;Wide base of support;Decreased weight shift to left Gait velocity: slow Gait velocity interpretation: Below normal speed for age/gender General Gait Details: cued sequence for reciprocal gait   Stairs Stairs: Yes Stairs assistance: Min guard Stair Management: Two rails;Forwards;Step to pattern Number of Stairs: 4 General stair comments: follows instructions well  Wheelchair Mobility    Modified Rankin (Stroke Patients Only)        Balance Overall balance assessment: Needs assistance Sitting-balance support: Feet supported Sitting balance-Leahy Scale: Good       Standing balance-Leahy Scale: Fair                      Cognition Arousal/Alertness: Awake/alert Behavior During Therapy: WFL for tasks assessed/performed Overall Cognitive Status: Within Functional Limits for tasks assessed                      Exercises Total Joint Exercises Ankle Circles/Pumps: AROM;Both;10 reps Quad Sets: AROM;Both;15 reps Gluteal Sets: AROM;Both;15 reps Heel Slides: AAROM;Left;20 reps Hip ABduction/ADduction: AROM;AAROM;Left;15 reps Goniometric ROM: -8, 86 deg    General Comments General comments (skin integrity, edema, etc.): TENS unit was not working and replaced battery      Pertinent Vitals/Pain Pain Assessment: 0-10 Pain Score: 4  Pain Location: L knee but managed well Pain Descriptors / Indicators: Sore Pain Intervention(s): Monitored during session;Premedicated before session;Repositioned;Ice applied    Home Living                      Prior Function            PT Goals (current goals can now be found in the care plan section) Progress towards PT goals: Progressing toward goals    Frequency  7X/week    PT Plan Current plan remains appropriate    Co-evaluation             End of Session Equipment Utilized During Treatment: Gait belt Activity Tolerance: Patient tolerated treatment well Patient left: in bed;with call bell/phone within reach;with bed alarm set;Other (comment) (ice and compression applied, TENS  applied)     Time: 0826-0922 PT Time Calculation (min) (ACUTE ONLY): 56 min  Charges:  $Gait Training: 23-37 mins $Therapeutic Exercise: 23-37 mins                    G Codes:      Ramond Dial 13-Apr-2016, 12:06 PM    Mee Hives, PT MS Acute Rehab Dept. Number: [place] and [place]

## 2016-03-21 NOTE — Progress Notes (Signed)
Occupational Therapy Treatment Patient Details Name: Shannon Chung MRN: [id number] DOB: 02-08-1941 Today's Date: 03/21/2016    History of present illness Pt is a pleasant 75 y/o male that presents for L TKR on 03/18/2016   OT comments  Patient was supine in bed when OT arrived. Able to perform bed mobility with Modif I using bed rails. Supervision for transfer bed to recliner. Able to perform LB dressing using AE with setup. Educated on safety for managing ADL tasks in the home environment. Patient participated in discussion, asking appropriate questions. Educated on DME for safety with toileting, and bathing once cleared. Patient verbalized understanding of all education provided.   Follow Up Recommendations  Outpatient OT    Equipment Recommendations  3 in 1 bedside comode;Tub/shower seat    Recommendations for Other Services      Precautions / Restrictions Precautions Precautions: Knee Required Braces or Orthoses: Knee Immobilizer - Left Knee Immobilizer - Left: On when out of bed or walking Restrictions Weight Bearing Restrictions: Yes LLE Weight Bearing: Partial weight bearing LLE Partial Weight Bearing Percentage or Pounds: 50%       Mobility Bed Mobility Overal bed mobility: Modified Independent Bed Mobility: Supine to Sit;Sit to Supine     Supine to sit: Modified independent (Device/Increase time)        Transfers Overall transfer level: Needs assistance Equipment used: Rolling walker (2 wheeled) Transfers: Sit to/from [facility] Sit to Stand: Supervision Stand pivot transfers: Supervision       General transfer comment: Cues for proper hand placement and positioning of LE when sitting    Balance Overall balance assessment: Needs assistance Sitting-balance support: Feet supported Sitting balance-Leahy Scale: Good       Standing balance-Leahy Scale: Fair                     ADL Overall ADL's : Needs assistance/impaired                      Lower Body Dressing: With adaptive equipment;Cueing for compensatory techniques;Sitting/lateral leans;Set up Lower Body Dressing Details (indicate cue type and reason): Pt able to don/doff socks using reacher and sock aid after set up, and instruction for techniques                      Vision                     Perception     Praxis      Cognition   Behavior During Therapy: [facility] for tasks assessed/performed Overall Cognitive Status: Within Functional Limits for tasks assessed                       Extremity/Trunk Assessment               Exercises Total Joint Exercises Ankle Circles/Pumps: AROM;Both;10 reps Quad Sets: AROM;Both;15 reps Gluteal Sets: AROM;Both;15 reps Heel Slides: AAROM;Left;20 reps Hip ABduction/ADduction: AROM;AAROM;Left;15 reps Goniometric ROM: -8, 86 deg   Shoulder Instructions       General Comments      Pertinent Vitals/ Pain       Pain Assessment: 0-10 Pain Score: 4  Pain Location: L Knee Pain Descriptors / Indicators: Sore Pain Intervention(s): Monitored during session;Repositioned;Ice applied  Home Living  Prior Functioning/Environment              Frequency Min 1X/week     Progress Toward Goals  OT Goals(current goals can now be found in the care plan section)  Progress towards OT goals: Progressing toward goals  Acute Rehab OT Goals Patient Stated Goal: To go home OT Goal Formulation: With patient Time For Goal Achievement: 04/02/16 Potential to Achieve Goals: Good  Plan Discharge plan remains appropriate    Co-evaluation          OT goals addressed during session: ADL's and self-care;Proper use of Adaptive equipment and DME      End of Session Equipment Utilized During Treatment: Gait belt;Rolling walker;Left knee immobilizer   Activity Tolerance Patient tolerated treatment well   Patient Left  in chair;with call bell/phone within reach;with chair alarm set   Nurse Communication          Time: 1000-1043 OT Time Calculation (min): 43 min  Charges: OT General Charges $OT Visit: 1 Procedure OT Treatments $Self Care/Home Management : 38-52 mins  Shannon Chung L 03/21/2016, 1:30 PM  Amie [place], OTR/L

## 2016-03-21 NOTE — Care Management Note (Signed)
Case Management Note  Patient Details  Name: Chayson Grall MRN: [id number] Date of Birth: December 31, 1940  Subjective/Objective:        Discharge to home today with no home health. Mr Khim verbalized understanding of discharge instructions to F/U in the [facility] on Mon 03/23/16 for OP PT therapy.             Action/Plan:   Expected Discharge Date:                  Expected Discharge Plan:     In-House Referral:     Discharge planning Services     Post Acute Care Choice:    Choice offered to:     DME Arranged:    DME Agency:     HH Arranged:    Wall Agency:     Status of Service:     Medicare Important Message Given:  Yes Date Medicare IM Given:    Medicare IM give by:    Date Additional Medicare IM Given:    Additional Medicare Important Message give by:     If discussed at [place] of Stay Meetings, dates discussed:    Additional Comments:  Alara Daniel A, RN 03/21/2016, 12:42 PM

## 2016-03-21 NOTE — Progress Notes (Signed)
Subjective: 3 Days Post-Op Procedure(s) (LRB): TOTAL KNEE ARTHROPLASTY (Left)    Patient reports pain as mild.  Objective:   VITALS:   Filed Vitals:   03/21/16 0451 03/21/16 0802  BP: 120/72 121/76  Pulse: 77 72  Temp: 98 F (36.7 C) 98.4 F (36.9 C)  Resp: 20     Neurologically intact ABD soft Neurovascular intact Sensation intact distally Intact pulses distally Dorsiflexion/Plantar flexion intact Compartment soft dressing dry  LABS  Recent Labs  03/19/16 0314 03/20/16 0439 03/21/16 0340  HGB 11.4* 10.7* 10.2*  HCT 33.3* 31.2* 29.9*  WBC 7.2 7.7 7.3  PLT 100* 103* 106*     Recent Labs  03/18/16 1309 03/19/16 0314  NA  --  134*  K  --  4.1  BUN  --  14  CREATININE 0.80 0.93  GLUCOSE  --  175*    No results for input(s): LABPT, INR in the last 72 hours.   Assessment/Plan: 3 Days Post-Op Procedure(s) (LRB): TOTAL KNEE ARTHROPLASTY (Left)   D/C today  OPPT next week RTC 5 days

## 2016-03-28 ENCOUNTER — Encounter: Payer: Self-pay | Admitting: Family Medicine

## 2016-04-20 ENCOUNTER — Ambulatory Visit (INDEPENDENT_AMBULATORY_CARE_PROVIDER_SITE_OTHER): Payer: Medicare Other | Admitting: Family Medicine

## 2016-04-20 ENCOUNTER — Encounter: Payer: Self-pay | Admitting: Family Medicine

## 2016-04-20 VITALS — BP 130/80 | HR 88 | Temp 98.0°F | Wt 213.5 lb

## 2016-04-20 DIAGNOSIS — R7303 Prediabetes: Secondary | ICD-10-CM

## 2016-04-20 DIAGNOSIS — M179 Osteoarthritis of knee, unspecified: Secondary | ICD-10-CM | POA: Diagnosis not present

## 2016-04-20 DIAGNOSIS — M1710 Unilateral primary osteoarthritis, unspecified knee: Secondary | ICD-10-CM

## 2016-04-20 DIAGNOSIS — I2510 Atherosclerotic heart disease of native coronary artery without angina pectoris: Secondary | ICD-10-CM

## 2016-04-20 NOTE — Progress Notes (Signed)
BP 130/80 mmHg  Pulse 88  Temp(Src) 98 F (36.7 C) (Oral)  Wt 213 lb 8 oz (96.843 kg)   CC: 14mo f/u visit  Subjective:    Patient ID: Shannon Chung, male    DOB: 02/12/1941, 75 y.o.   MRN: [id number]  HPI: Shannon Chung is a 75 y.o. 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 Saw Dr Rockey Situ last month. H/o L apical thrombus, no need for [facility] given preserved LV function. Continues beta blocker and lisinopril along with aspirin, lipitor 40mg  daily.   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 Recovering well. Continues receiving PT outpatient twice weekly. Has f/u on Wednesday with ortho. Ran out of hydrocodone. Continues tylenol prn. Took aspirin 325mg  bid for a few weeks perioperatively, now self transitioned back to 81mg  daily.  Newly dx DM 08/2015 - controlled with weight loss and diet changes. Now in prediabetes range.   Lab Results  Component Value Date   HGBA1C 6.4* 03/04/2016    Relevant past medical, surgical, family and social history reviewed and updated as indicated. Interim medical history since our last visit reviewed. Allergies and medications reviewed and updated. Current Outpatient Prescriptions on File Prior to Visit  Medication Sig  . acetaminophen (TYLENOL) 500 MG tablet Take 500 mg by mouth every 6 (six) hours as needed for mild pain.   Marland Kitchen atorvastatin (LIPITOR) 40 MG tablet Take 1 tablet (40 mg total) by mouth daily. (Patient taking differently: Take 40 mg by mouth at bedtime. )  . Cholecalciferol (VITAMIN D3) 1000 UNITS CAPS Take 1 capsule (1,000 Units total) by mouth daily.  . fluticasone (FLONASE) 50 MCG/ACT nasal spray Place 1 spray into both nostrils as needed for allergies. Reported on 03/18/2016  . gabapentin (NEURONTIN) 400 MG capsule Take 1 capsule (400 mg total) by mouth 2 (two) times daily.  . lansoprazole (PREVACID) 30 MG capsule Take 1 capsule (30 mg total) by mouth every morning.  Marland Kitchen lisinopril  (PRINIVIL,ZESTRIL) 2.5 MG tablet Take 1 tablet (2.5 mg total) by mouth daily.  . metoprolol succinate (TOPROL-XL) 25 MG 24 hr tablet Take 1 tablet (25 mg total) by mouth daily.  . ranitidine (ZANTAC) 150 MG tablet Take 1 tablet (150 mg total) by mouth at bedtime.  . vitamin B-12 (CYANOCOBALAMIN) 500 MCG tablet Take 500 mcg by mouth daily. Reported on 03/17/2016  . ferrous sulfate 325 (65 FE) MG tablet Take 1 tablet (325 mg total) by mouth daily with breakfast. (Patient not taking: Reported on 04/20/2016)   No current facility-administered medications on file prior to visit.    Review of Systems Per HPI unless specifically indicated in ROS section     Objective:    BP 130/80 mmHg  Pulse 88  Temp(Src) 98 F (36.7 C) (Oral)  Wt 213 lb 8 oz (96.843 kg)  Wt Readings from Last 3 Encounters:  04/20/16 213 lb 8 oz (96.843 kg)  03/18/16 220 lb 12 oz (100.132 kg)  03/17/16 220 lb 12 oz (100.132 kg)   Body mass index is 28.17 kg/(m^2).  Physical Exam  Constitutional: He appears well-developed and well-nourished. No distress.  HENT:  Mouth/Throat: Oropharynx is clear and moist. No oropharyngeal exudate.  Cardiovascular: Normal rate, regular rhythm, normal heart sounds and intact distal pulses.   No murmur heard. Pulmonary/Chest: Effort normal and breath sounds normal. No respiratory distress. He has no wheezes. He has no rales.  Musculoskeletal: He exhibits no edema.  L compression  sock in place Midline incision across knee healing well  Skin: Skin is warm and dry. No rash noted.  Nursing note and vitals reviewed.      Assessment & Plan:   Problem List Items Addressed This Visit    Osteoarthritis   Relevant Medications   aspirin (ASPIRIN EC) 81 MG EC tablet   Prediabetes - Primary    Prediabetes remains well controlled. Discussed ongoing noted weight loss.       Coronary artery disease    Discussed different diets, recommended mediterranean diet, handout provided.        Relevant Medications   aspirin (ASPIRIN EC) 81 MG EC tablet       Follow up plan: Return in about 6 months (around 10/20/2016) for medicare wellness visit.  Ria Bush, MD

## 2016-04-20 NOTE — Assessment & Plan Note (Deleted)
Discussed different diets, recommended mediterranean diet, handout provided.

## 2016-04-20 NOTE — Assessment & Plan Note (Signed)
Prediabetes remains well controlled. Discussed ongoing noted weight loss.

## 2016-04-20 NOTE — Progress Notes (Signed)
Pre visit review using our clinic review tool, if applicable. No additional management support is needed unless otherwise documented below in the visit note. 

## 2016-04-20 NOTE — Assessment & Plan Note (Signed)
Discussed different diets, recommended mediterranean diet, handout provided.

## 2016-04-20 NOTE — Patient Instructions (Addendum)
You are doing well today.  Continue current medicines.  Return in 6 months for medicare wellness visit.       Mediterranean Diet  Why follow it? Research shows. . Those who follow the Mediterranean diet have a reduced risk of heart disease  . The diet is associated with a reduced incidence of Parkinson's and Alzheimer's diseases . People following the diet may have longer life expectancies and lower rates of chronic diseases  . The Dietary Guidelines for Americans recommends the Mediterranean diet as an eating plan to promote health and prevent disease  What Is the Mediterranean Diet?  . Healthy eating plan based on typical foods and recipes of Mediterranean-style cooking . The diet is primarily a plant based diet; these foods should make up a majority of meals   Starches - Plant based foods should make up a majority of meals - They are an important sources of vitamins, minerals, energy, antioxidants, and fiber - Choose whole grains, foods high in fiber and minimally processed items  - Typical grain sources include wheat, oats, barley, corn, brown rice, bulgar, farro, millet, polenta, couscous  - Various types of beans include chickpeas, lentils, fava beans, black beans, white beans   Fruits  Veggies - Large quantities of antioxidant rich fruits & veggies; 6 or more servings  - Vegetables can be eaten raw or lightly drizzled with oil and cooked  - Vegetables common to the traditional Mediterranean Diet include: artichokes, arugula, beets, broccoli, brussel sprouts, cabbage, carrots, celery, collard greens, cucumbers, eggplant, kale, leeks, lemons, lettuce, mushrooms, okra, onions, peas, peppers, potatoes, pumpkin, radishes, rutabaga, shallots, spinach, sweet potatoes, turnips, zucchini - Fruits common to the Mediterranean Diet include: apples, apricots, avocados, cherries, clementines, dates, figs, grapefruits, grapes, melons, nectarines, oranges, peaches, pears, pomegranates, strawberries,  tangerines  Fats - Replace butter and margarine with healthy oils, such as olive oil, canola oil, and tahini  - Limit nuts to no more than a handful a day  - Nuts include walnuts, almonds, pecans, pistachios, pine nuts  - Limit or avoid candied, honey roasted or heavily salted nuts - Olives are central to the [employer] - can be eaten whole or used in a variety of dishes   Meats Protein - Limiting red meat: no more than a few times a month - When eating red meat: choose lean cuts and keep the portion to the size of deck of cards - Eggs: approx. 0 to 4 times a week  - Fish and lean poultry: at least 2 a week  - Healthy protein sources include, chicken, [place], lean beef, lamb - Increase intake of seafood such as tuna, salmon, trout, mackerel, shrimp, scallops - Avoid or limit high fat processed meats such as sausage and bacon  Dairy - Include moderate amounts of low fat dairy products  - Focus on healthy dairy such as fat free yogurt, skim milk, low or reduced fat cheese - Limit dairy products higher in fat such as whole or 2% milk, cheese, ice cream  Alcohol - Moderate amounts of red wine is ok  - No more than 5 oz daily for women (all ages) and men older than age 16  - No more than 10 oz of wine daily for men younger than 62  Other - Limit sweets and other desserts  - Use herbs and spices instead of salt to flavor foods  - Herbs and spices common to the traditional Mediterranean Diet include: basil, bay leaves, chives, cloves, cumin, fennel, garlic, lavender,  marjoram, mint, oregano, parsley, pepper, rosemary, sage, savory, sumac, tarragon, thyme   It's not just a diet, it's a lifestyle:  . The Mediterranean diet includes lifestyle factors typical of those in the region  . Foods, drinks and meals are best eaten with others and savored . Daily physical activity is important for overall good health . This could be strenuous exercise like running and aerobics . This could also be  more leisurely activities such as walking, housework, yard-work, or taking the stairs . Moderation is the key; a balanced and healthy diet accommodates most foods and drinks . Consider portion sizes and frequency of consumption of certain foods   Meal Ideas & Options:  . Breakfast:  o Whole wheat toast or whole wheat English muffins with peanut butter & hard boiled egg o Steel cut oats topped with apples & cinnamon and skim milk  o Fresh fruit: banana, strawberries, melon, berries, peaches  o Smoothies: strawberries, bananas, greek yogurt, peanut butter o Low fat greek yogurt with blueberries and granola  o Egg white omelet with spinach and mushrooms o Breakfast couscous: whole wheat couscous, apricots, skim milk, cranberries  . Sandwiches:  o Hummus and grilled vegetables (peppers, zucchini, squash) on whole wheat bread   o Grilled chicken on whole wheat pita with lettuce, tomatoes, cucumbers or tzatziki  o Tuna salad on whole wheat bread: tuna salad made with greek yogurt, olives, red peppers, capers, green onions o Garlic rosemary lamb pita: lamb sauted with garlic, rosemary, salt & pepper; add lettuce, cucumber, greek yogurt to pita - flavor with lemon juice and black pepper  . Seafood:  o Mediterranean grilled salmon, seasoned with garlic, basil, parsley, lemon juice and black pepper o Shrimp, lemon, and spinach whole-grain pasta salad made with low fat greek yogurt  o Seared scallops with lemon orzo  o Seared tuna steaks seasoned salt, pepper, coriander topped with tomato mixture of olives, tomatoes, olive oil, minced garlic, parsley, green onions and cappers  . Meats:  o Herbed greek chicken salad with kalamata olives, cucumber, feta  o Red bell peppers stuffed with spinach, bulgur, lean ground beef (or lentils) & topped with feta   o Kebabs: skewers of chicken, tomatoes, onions, zucchini, squash  o [place] burgers: made with red onions, mint, dill, lemon juice, feta cheese topped  with roasted red peppers . Vegetarian o Cucumber salad: cucumbers, artichoke hearts, celery, red onion, feta cheese, tossed in olive oil & lemon juice  o Hummus and whole grain pita points with a greek salad (lettuce, tomato, feta, olives, cucumbers, red onion) o Lentil soup with celery, carrots made with vegetable broth, garlic, salt and pepper  o Tabouli salad: parsley, bulgur, mint, scallions, cucumbers, tomato, radishes, lemon juice, olive oil, salt and pepper.

## 2016-10-14 ENCOUNTER — Ambulatory Visit: Payer: Medicare Other

## 2016-10-14 ENCOUNTER — Other Ambulatory Visit: Payer: Medicare Other

## 2016-10-21 ENCOUNTER — Encounter: Payer: Medicare Other | Admitting: Family Medicine

## 2016-11-28 ENCOUNTER — Other Ambulatory Visit: Payer: Self-pay | Admitting: Family Medicine

## 2016-12-03 ENCOUNTER — Encounter: Payer: Self-pay | Admitting: Family Medicine

## 2016-12-17 ENCOUNTER — Ambulatory Visit: Payer: Medicare Other

## 2016-12-23 ENCOUNTER — Encounter: Payer: Medicare Other | Admitting: Family Medicine

## 2016-12-31 HISTORY — PX: CATARACT EXTRACTION: SUR2

## 2017-01-05 ENCOUNTER — Other Ambulatory Visit: Payer: Self-pay | Admitting: Cardiovascular Disease

## 2017-01-20 ENCOUNTER — Encounter: Payer: Self-pay | Admitting: Family Medicine

## 2017-01-20 ENCOUNTER — Ambulatory Visit: Payer: Medicare Other

## 2017-01-24 DIAGNOSIS — Z951 Presence of aortocoronary bypass graft: Secondary | ICD-10-CM | POA: Insufficient documentation

## 2017-01-24 NOTE — Progress Notes (Signed)
Cardiology Office Note  Date:  01/25/2017   ID:  Shannon Chung, DOB 09-03-1941, MRN [id number]  PCP:  Ria Bush, MD   Chief Complaint  Patient presents with  . other    6 month follow up. Meds reviewed by the pt. verbally. "doing well."    HPI:  Shannon Chung is a 76 year old male with  coronary artery disease,   severe multivessel disease, with CABG at [facility] August 2016,  ischemic cardiomyopathy,  initial ejection fraction in July 2015 of 35%, up to 55% after CABG  cardiac PET scan showing scar in the apical and periapical region,  mural thrombus seen in July 2015, started on anticoagulation,   hyperlipidemia,   who presents for routine follow-up of his coronary artery disease   In follow-up, he reports that he is doing well, denies any symptoms concerning for angina He uses the gym in the hospital on a regular basis, denies shortness of breath  Echocardiogram showing ejection fraction greater than 55% (improved after revascularization) Results discussed with him   total left knee replacement by Dr. Bretta Bang well Initial pain x 1 month Does not want to get the other knee done  LABS HBA!C 6.4 Total chol 123, LDL 51 On lipitor  Weight up 10 pounds Trying to watch his diet  EKG on today's visit shows normal sinus rhythm with rate 63 bpm, old anterior MI, consider inferior MI, no change from prior EKGs, no change compared to 09/2015  Other past medical history Previously noted to have abnormal EKG and then was referred to cardiology. They had talked about doing a cardiac catheterization but instead did a PET viability study.   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  PSH:    Past Surgical History:  Procedure Laterality Date  . BICEPS TENDON REPAIR Right 1993  . CARDIAC CATHETERIZATION N/A 07/05/2015   Procedure: Left Heart Cath and Coronary Angiography;  Surgeon: Minna Merritts, MD;  Location: [place] [facility];  Service: Cardiovascular;  Laterality: N/A;  . CATARACT EXTRACTION Left 12/2016   with keratoplasty  . COLONOSCOPY  2007  . CORONARY ARTERY BYPASS GRAFT N/A 07/29/2015   Procedure: CORONARY ARTERY BYPASS GRAFTING (CABG) x 5 (LIMA to LAD, SVG to DIAGONAL, SVG SEQUENTIALLY to OM1 and OM2, SVG to OM3) with Endoscopic Vein Havesting of GREATER SAPHENOUS VEIN from RIGHT THIGH and partial LOWER LEG ;  Surgeon: [name], MD;  Location: [place] [facility];  Service: Open Heart Surgery;  Laterality: N/A;  . JOINT REPLACEMENT    . KNEE ARTHROSCOPY Left remote  . SKIN CANCER EXCISION  10/2015   BCC - L ala (pending MOHs) and L scapula (complete excision)  . TEE WITHOUT CARDIOVERSION N/A 07/29/2015   Procedure: TRANSESOPHAGEAL ECHOCARDIOGRAM (TEE);  Surgeon: [name], MD;  Location: [place];  Service: Open Heart Surgery;  Laterality: N/A;  . TONSILLECTOMY  1949  . TOTAL KNEE ARTHROPLASTY Left 03/18/2016   cemented L TKR; Earnestine Leys, MD    Current Outpatient Prescriptions  Medication Sig Dispense Refill  . acetaminophen (TYLENOL) 500 MG tablet Take 500 mg by mouth every 6 (six) hours as needed for mild pain.     Marland Kitchen aspirin (ASPIRIN  EC) 81 MG EC tablet Take 81 mg by mouth daily. Swallow whole.    Marland Kitchen atorvastatin (LIPITOR) 40 MG tablet Take 1 tablet (40 mg total) by mouth daily. (Patient taking differently: Take 40 mg by mouth at bedtime. ) 90 tablet 3  . Cholecalciferol (VITAMIN D3) 1000 UNITS CAPS Take 1 capsule (1,000 Units total) by mouth daily. 30 capsule   . ferrous sulfate 325 (65 FE) MG tablet Take 1 tablet (325 mg total) by mouth daily with breakfast. 60 tablet 3  . fluticasone (FLONASE) 50 MCG/ACT nasal spray Place 1 spray into both nostrils as needed for allergies. Reported on 03/18/2016    . gabapentin (NEURONTIN) 400 MG capsule Take 1 capsule (400 mg total) by mouth 2 (two) times daily. 60 capsule 3  . lansoprazole (PREVACID) 30 MG capsule Take 1 capsule (30 mg total) by mouth every morning. 90 capsule 3  . lisinopril (PRINIVIL,ZESTRIL) 2.5 MG tablet TAKE 1 TABLET (2.5 MG TOTAL) BY MOUTH DAILY. 90 tablet 3  . metoprolol succinate (TOPROL-XL) 25 MG 24 hr tablet TAKE 1 TABLET (25 MG TOTAL) BY MOUTH DAILY. 90 tablet 3  . ranitidine (ZANTAC) 150 MG tablet TAKE 1 TABLET (150 MG TOTAL) BY MOUTH AT BEDTIME. 180 tablet 1  . vitamin B-12 (CYANOCOBALAMIN) 500 MCG tablet Take 500 mcg by mouth daily. Reported on 03/17/2016     No current facility-administered medications for this visit.      Allergies:   Patient has no known allergies.   Social History:  The patient  reports that he quit smoking about 38 years ago. His smoking use included Cigarettes. He has a 5.00 pack-year smoking history. He has never used smokeless tobacco. He reports that he drinks alcohol. He reports that he does not use drugs.   Family History:   family history includes Alcoholism in his father; CAD (age of onset: 40) in his father; Diabetes in his father; Hyperlipidemia in his father; Hypertension in his father.    Review of Systems: Review of Systems  Constitutional: Negative.   Respiratory: Negative.   Cardiovascular: Negative.    Gastrointestinal: Negative.   Musculoskeletal: Positive for joint pain.  Neurological: Negative.   Psychiatric/Behavioral: Negative.   All other systems reviewed and are negative.    PHYSICAL EXAM: VS:  BP 140/80 (BP Location: Left Arm, Patient Position: Sitting, Cuff Size: Normal)   Pulse 63   Ht 6\' 1"  (1.854 m)   Wt 231 lb 8 oz (105 kg)   BMI 30.54 kg/m  , BMI Body mass index is 30.54 kg/m. GEN: Well nourished, well developed, in no acute distress  HEENT: normal  Neck: no JVD, carotid bruits, or masses Cardiac: RRR; no murmurs, rubs, or gallops,no edema  Respiratory:  clear to auscultation bilaterally, normal work of breathing GI: soft, nontender, nondistended, + BS MS: no deformity or atrophy  Skin: warm and dry, no rash Neuro:  Strength and sensation are intact Psych: euthymic mood, full affect    Recent Labs: 03/19/2016: BUN 14; Creatinine, Ser 0.93; Potassium 4.1; Sodium 134 03/21/2016: Hemoglobin 10.2; Platelets 106    Lipid Panel Lab Results  Component Value Date  CHOL 123 01/23/2016   HDL 49 01/23/2016   LDLCALC 51 01/23/2016   TRIG 116 01/23/2016      Wt Readings from Last 3 Encounters:  01/25/17 231 lb 8 oz (105 kg)  04/20/16 213 lb 8 oz (96.8 kg)  03/18/16 220 lb 12 oz (100.1 kg)       ASSESSMENT AND PLAN:  Ischemic cardiomyopathy - Plan: EKG 12-Lead Ejection fraction improved after bypass surgery No signs of CHF, appears euvolemic  Mixed hyperlipidemia - Plan: EKG 12-Lead Cholesterol is at goal on the current lipid regimen. No changes to the medications were made.  Essential hypertension - Plan: EKG 12-Lead Blood pressure is well controlled on today's visit. No changes made to the medications.  Hx of CABG - Plan: EKG 12-Lead Discussed previous surgical report with him  Obesity We have encouraged continued exercise, careful diet management in an effort to lose weight.    Total encounter time more than 25 minutes  Greater than 50%  was spent in counseling and coordination of care with the patient   Disposition:   F/U  6 months   Orders Placed This Encounter  Procedures  . EKG 12-Lead     Signed, Esmond Plants, M.D., Ph.D. 01/25/2017  [place], [place]

## 2017-01-25 ENCOUNTER — Encounter: Payer: Self-pay | Admitting: Cardiovascular Disease

## 2017-01-25 ENCOUNTER — Ambulatory Visit (INDEPENDENT_AMBULATORY_CARE_PROVIDER_SITE_OTHER): Payer: Medicare Other | Admitting: Cardiovascular Disease

## 2017-01-25 VITALS — BP 140/80 | HR 63 | Ht 73.0 in | Wt 231.5 lb

## 2017-01-25 DIAGNOSIS — I255 Ischemic cardiomyopathy: Secondary | ICD-10-CM | POA: Diagnosis not present

## 2017-01-25 DIAGNOSIS — I10 Essential (primary) hypertension: Secondary | ICD-10-CM

## 2017-01-25 DIAGNOSIS — E669 Obesity, unspecified: Secondary | ICD-10-CM | POA: Insufficient documentation

## 2017-01-25 DIAGNOSIS — E782 Mixed hyperlipidemia: Secondary | ICD-10-CM | POA: Diagnosis not present

## 2017-01-25 DIAGNOSIS — E66811 Obesity, class 1: Secondary | ICD-10-CM

## 2017-01-25 DIAGNOSIS — Z951 Presence of aortocoronary bypass graft: Secondary | ICD-10-CM

## 2017-01-25 NOTE — Patient Instructions (Signed)

## 2017-01-27 ENCOUNTER — Encounter: Payer: Self-pay | Admitting: Family Medicine

## 2017-02-16 ENCOUNTER — Other Ambulatory Visit: Payer: Self-pay | Admitting: Family Medicine

## 2017-02-16 DIAGNOSIS — E782 Mixed hyperlipidemia: Secondary | ICD-10-CM

## 2017-02-16 DIAGNOSIS — Z125 Encounter for screening for malignant neoplasm of prostate: Secondary | ICD-10-CM

## 2017-02-16 DIAGNOSIS — R7303 Prediabetes: Secondary | ICD-10-CM

## 2017-02-16 DIAGNOSIS — D649 Anemia, unspecified: Secondary | ICD-10-CM

## 2017-02-16 DIAGNOSIS — E559 Vitamin D deficiency, unspecified: Secondary | ICD-10-CM

## 2017-02-17 ENCOUNTER — Ambulatory Visit: Payer: Medicare Other

## 2017-02-18 ENCOUNTER — Ambulatory Visit (INDEPENDENT_AMBULATORY_CARE_PROVIDER_SITE_OTHER): Payer: Medicare Other

## 2017-02-18 VITALS — BP 126/78 | HR 70 | Temp 98.5°F | Ht 71.5 in | Wt 228.5 lb

## 2017-02-18 DIAGNOSIS — Z0000 Encounter for general adult medical examination without abnormal findings: Secondary | ICD-10-CM | POA: Diagnosis not present

## 2017-02-18 DIAGNOSIS — E782 Mixed hyperlipidemia: Secondary | ICD-10-CM

## 2017-02-18 DIAGNOSIS — Z125 Encounter for screening for malignant neoplasm of prostate: Secondary | ICD-10-CM

## 2017-02-18 DIAGNOSIS — E559 Vitamin D deficiency, unspecified: Secondary | ICD-10-CM | POA: Diagnosis not present

## 2017-02-18 DIAGNOSIS — R7303 Prediabetes: Secondary | ICD-10-CM | POA: Diagnosis not present

## 2017-02-18 DIAGNOSIS — D649 Anemia, unspecified: Secondary | ICD-10-CM

## 2017-02-18 LAB — CBC WITH DIFFERENTIAL/PLATELET
BASOS ABS: 0.1 10*3/uL (ref 0.0–0.1)
Basophils Relative: 1.1 % (ref 0.0–3.0)
Eosinophils Absolute: 0.1 10*3/uL (ref 0.0–0.7)
Eosinophils Relative: 2.4 % (ref 0.0–5.0)
HEMATOCRIT: 42.8 % (ref 39.0–52.0)
Hemoglobin: 14.2 g/dL (ref 13.0–17.0)
LYMPHS PCT: 38 % (ref 12.0–46.0)
Lymphs Abs: 2.3 10*3/uL (ref 0.7–4.0)
MCHC: 33.2 g/dL (ref 30.0–36.0)
MCV: 95.9 fl (ref 78.0–100.0)
MONOS PCT: 8.4 % (ref 3.0–12.0)
Monocytes Absolute: 0.5 10*3/uL (ref 0.1–1.0)
Neutro Abs: 3 10*3/uL (ref 1.4–7.7)
Neutrophils Relative %: 50.1 % (ref 43.0–77.0)
Platelets: 183 10*3/uL (ref 150.0–400.0)
RBC: 4.46 Mil/uL (ref 4.22–5.81)
RDW: 14.2 % (ref 11.5–15.5)
WBC: 6 10*3/uL (ref 4.0–10.5)

## 2017-02-18 LAB — LIPID PANEL
CHOLESTEROL: 123 mg/dL (ref 0–200)
HDL: 45.1 mg/dL (ref >39.00)
LDL CALC: 58 mg/dL (ref 0–99)
NonHDL: 77.53
TRIGLYCERIDES: 97 mg/dL (ref 0.0–149.0)
Total CHOL/HDL Ratio: 3
VLDL: 19.4 mg/dL (ref 0.0–40.0)

## 2017-02-18 LAB — BASIC METABOLIC PANEL
BUN: 16 mg/dL (ref 6–23)
CALCIUM: 9.7 mg/dL (ref 8.4–10.5)
CO2: 28 mEq/L (ref 19–32)
Chloride: 105 mEq/L (ref 96–112)
Creatinine, Ser: 0.9 mg/dL (ref 0.40–1.50)
GFR: 87.79 mL/min (ref >60.00)
GLUCOSE: 103 mg/dL — AB (ref 70–99)
POTASSIUM: 4.3 meq/L (ref 3.5–5.1)
Sodium: 141 mEq/L (ref 135–145)

## 2017-02-18 LAB — HEMOGLOBIN A1C: Hgb A1c MFr Bld: 6.9 % — ABNORMAL HIGH (ref 4.6–6.5)

## 2017-02-18 LAB — VITAMIN D 25 HYDROXY (VIT D DEFICIENCY, FRACTURES): VITD: 27.29 ng/mL — ABNORMAL LOW (ref 30.00–100.00)

## 2017-02-18 LAB — PSA, MEDICARE: PSA: 3.17 ng/ml (ref 0.10–4.00)

## 2017-02-18 NOTE — Patient Instructions (Signed)
Shannon Chung , Thank you for taking time to come for your Medicare Wellness Visit. I appreciate your ongoing commitment to your health goals. Please review the following plan we discussed and let me know if I can assist you in the future.   These are the goals we discussed: Goals    . Increase physical activity          Starting 02/18/17, I will continue to exercise at least 60 min 4 days per week.        This is a list of the screening recommended for you and due dates:  Health Maintenance  Topic Date Due  . Complete foot exam   02/23/2017*  . Colon Cancer Screening  03/01/2018*  . Flu Shot  06/02/2017  . Eye exam for diabetics  06/02/2017  . Hemoglobin A1C  08/20/2017  . DTaP/Tdap/Td vaccine (2 - Td) 05/01/2025  . Tetanus Vaccine  05/01/2025  . Pneumonia vaccines  Completed  *Topic was postponed. The date shown is not the original due date.   Preventive Care for Adults  A healthy lifestyle and preventive care can promote health and wellness. Preventive health guidelines for adults include the following key practices.  . A routine yearly physical is a good way to check with your health care provider about your health and preventive screening. It is a chance to share any concerns and updates on your health and to receive a thorough exam.  . Visit your dentist for a routine exam and preventive care every 6 months. Brush your teeth twice a day and floss once a day. Good oral hygiene prevents tooth decay and gum disease.  . The frequency of eye exams is based on your age, health, family medical history, use  of contact lenses, and other factors. Follow your health care provider's ecommendations for frequency of eye exams.  . Eat a healthy diet. Foods like vegetables, fruits, whole grains, low-fat dairy products, and lean protein foods contain the nutrients you need without too many calories. Decrease your intake of foods high in solid fats, added sugars, and salt. Eat the right  amount of calories for you. Get information about a proper diet from your health care provider, if necessary.  . Regular physical exercise is one of the most important things you can do for your health. Most adults should get at least 150 minutes of moderate-intensity exercise (any activity that increases your heart rate and causes you to sweat) each week. In addition, most adults need muscle-strengthening exercises on 2 or more days a week.  Silver Sneakers may be a benefit available to you. To determine eligibility, you may visit the website: www.silversneakers.com or contact program at (216)525-7516 Mon-Fri between 8AM-8PM.   . Maintain a healthy weight. The body mass index (BMI) is a screening tool to identify possible weight problems. It provides an estimate of body fat based on height and weight. Your health care provider can find your BMI and can help you achieve or maintain a healthy weight.   For adults 20 years and older: ? A BMI below 18.5 is considered underweight. ? A BMI of 18.5 to 24.9 is normal. ? A BMI of 25 to 29.9 is considered overweight. ? A BMI of 30 and above is considered obese.   . Maintain normal blood lipids and cholesterol levels by exercising and minimizing your intake of saturated fat. Eat a balanced diet with plenty of fruit and vegetables. Blood tests for lipids and cholesterol should begin at age  20 and be repeated every 5 years. If your lipid or cholesterol levels are high, you are over 50, or you are at high risk for heart disease, you may need your cholesterol levels checked more frequently. Ongoing high lipid and cholesterol levels should be treated with medicines if diet and exercise are not working.  . If you smoke, find out from your health care provider how to quit. If you do not use tobacco, please do not start.  . If you choose to drink alcohol, please do not consume more than 2 drinks per day. One drink is considered to be 12 ounces (355 mL) of beer, 5  ounces (148 mL) of wine, or 1.5 ounces (44 mL) of liquor.  . If you are 8-53 years old, ask your health care provider if you should take aspirin to prevent strokes.  . Use sunscreen. Apply sunscreen liberally and repeatedly throughout the day. You should seek shade when your shadow is shorter than you. Protect yourself by wearing long sleeves, pants, a wide-brimmed hat, and sunglasses year round, whenever you are outdoors.  . Once a month, do a whole body skin exam, using a mirror to look at the skin on your back. Tell your health care provider of new moles, moles that have irregular borders, moles that are larger than a pencil eraser, or moles that have changed in shape or color.

## 2017-02-18 NOTE — Progress Notes (Signed)
PCP notes:   Health maintenance:  Foot exam - PCP will address at next appt A1C - completed  Colon cancer screening - pt will discuss with PCP at next appt  Abnormal screenings:   Hearing - failed  Patient concerns:   Pt is concerned about blurred vision in left eye. Prescribed ointment by ophthalmologist.   Nurse concerns:  None  Next PCP appt:   02/23/17 @ 1130

## 2017-02-18 NOTE — Progress Notes (Signed)
Pre visit review using our clinic review tool, if applicable. No additional management support is needed unless otherwise documented below in the visit note. 

## 2017-02-18 NOTE — Progress Notes (Signed)
Subjective:   Shannon Chung is a 76 y.o. male who presents for Medicare Annual/Subsequent preventive examination.  Review of Systems:  N/A Cardiac Risk Factors include: advanced age (>19men, >51 women);male gender;dyslipidemia;hypertension;obesity (BMI >30kg/m2)     Objective:    Vitals: BP 126/78 (BP Location: Right Arm, Patient Position: Sitting, Cuff Size: Normal)   Pulse 70   Temp 98.5 F (36.9 C) (Oral)   Ht 5' 11.5" (1.816 m) Comment: no shoes  Wt 228 lb 8 oz (103.6 kg)   SpO2 92%   BMI 31.43 kg/m   Body mass index is 31.43 kg/m.  Tobacco History  Smoking Status  . Former Smoker  . Packs/day: 0.50  . Years: 10.00  . Types: Cigarettes  . Quit date: 11/02/1978  Smokeless Tobacco  . Never Used     Counseling given: No   Past Medical History:  Diagnosis Date  . Coronary artery disease    a. 06/2015 Cardiac CT: Ca score 1103 (84th %'ile);  b. 07/2015 Cath: LM 70, LAD 80p, 100/79m, D1 70, D2 95, RI 75, RCA 100p/m;  c. 07/2015 CABG x 5 (LIMA->LAD, VG->Diag, VG->OM1->OM2, VG->OM3).  . Diabetes mellitus without complication ([place]) 05/487  . Dyslipidemia   . Essential hypertension   . 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 GERD (gastroesophageal reflux disease)   . History of radiation exposure    right vocal cord squamous cell cancer  . Impingement syndrome of right shoulder 10/2015   s/p steroid injection Dr Sabra Heck  . Ischemic cardiomyopathy    a. dilated, EF 35% improved to 45-50% (2015);  b. 07/2015 EF 25-35% by LV gram.  . Lone atrial fibrillation ([place]) 1983   a. isolated episode, not on [place].  Marland Kitchen Mural thrombus of cardiac apex    a. 06/2014: LV; resolved with coumadin-->no residual on f/u echo, no longer on coumadin.  . Osteoarthritis    a. R-shoulder, L-knee [facility] ortho)  . Skin cancer    squamous and basal, sees derm regularly  . 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 Vitamin D deficiency    Past  Surgical History:  Procedure Laterality Date  . BICEPS TENDON REPAIR Right 1993  . CARDIAC CATHETERIZATION N/A 07/05/2015   Procedure: Left Heart Cath and Coronary Angiography;  Surgeon: Minna Merritts, MD;  Location: [place] [facility];  Service: Cardiovascular;  Laterality: N/A;  . CATARACT EXTRACTION Left 12/2016   with keratoplasty  . COLONOSCOPY  2007  . CORONARY ARTERY BYPASS GRAFT N/A 07/29/2015   Procedure: CORONARY ARTERY BYPASS GRAFTING (CABG) x 5 (LIMA to LAD, SVG to DIAGONAL, SVG SEQUENTIALLY to OM1 and OM2, SVG to OM3) with Endoscopic Vein Havesting of GREATER SAPHENOUS VEIN from RIGHT THIGH and partial LOWER LEG ;  Surgeon: [name], MD;  Location: [place] [facility];  Service: Open Heart Surgery;  Laterality: N/A;  . JOINT REPLACEMENT    . KNEE ARTHROSCOPY Left remote  . SKIN CANCER EXCISION  10/2015   BCC - L ala (pending MOHs) and L scapula (complete excision)  . TEE WITHOUT CARDIOVERSION N/A 07/29/2015   Procedure: TRANSESOPHAGEAL ECHOCARDIOGRAM (TEE);  Surgeon: [name], MD;  Location: [place];  Service: Open Heart Surgery;  Laterality: N/A;  . TONSILLECTOMY  1949  . TOTAL KNEE ARTHROPLASTY Left 03/18/2016   cemented L TKR; Earnestine Leys, MD   Family History  Problem Relation Age of Onset  . CAD Father 23  MI  . Hypertension Father   . Hyperlipidemia Father   . Alcoholism Father   . Diabetes Father    History  Sexual Activity  . Sexual activity: Yes    Outpatient Encounter Prescriptions as of 02/18/2017  Medication Sig  . acetaminophen (TYLENOL) 500 MG tablet Take 500 mg by mouth every 6 (six) hours as needed for mild pain.   Marland Kitchen aspirin (ASPIRIN EC) 81 MG EC tablet Take 162 mg by mouth daily. Swallow whole.   Marland Kitchen atorvastatin (LIPITOR) 40 MG tablet Take 1 tablet (40 mg total) by mouth daily. (Patient taking differently: Take 40 mg by mouth at bedtime. )  . Cholecalciferol (VITAMIN D3) 1000 UNITS CAPS Take 1 capsule (1,000 Units total) by mouth daily.  .  fluticasone (FLONASE) 50 MCG/ACT nasal spray Place 1 spray into both nostrils as needed for allergies. Reported on 03/18/2016  . lansoprazole (PREVACID) 30 MG capsule Take 1 capsule (30 mg total) by mouth every morning.  Marland Kitchen lisinopril (PRINIVIL,ZESTRIL) 2.5 MG tablet TAKE 1 TABLET (2.5 MG TOTAL) BY MOUTH DAILY.  . metoprolol succinate (TOPROL-XL) 25 MG 24 hr tablet TAKE 1 TABLET (25 MG TOTAL) BY MOUTH DAILY.  . ranitidine (ZANTAC) 150 MG tablet TAKE 1 TABLET (150 MG TOTAL) BY MOUTH AT BEDTIME.  . vitamin B-12 (CYANOCOBALAMIN) 500 MCG tablet Take 500 mcg by mouth daily. Reported on 03/17/2016  . [DISCONTINUED] ferrous sulfate 325 (65 FE) MG tablet Take 1 tablet (325 mg total) by mouth daily with breakfast.  . [DISCONTINUED] gabapentin (NEURONTIN) 400 MG capsule Take 1 capsule (400 mg total) by mouth 2 (two) times daily.   No facility-administered encounter medications on file as of 02/18/2017.     Activities of Daily Living In your present state of health, do you have any difficulty performing the following activities: 02/18/2017 03/18/2016  Hearing? N N  Vision? Y N  Difficulty concentrating or making decisions? N N  Walking or climbing stairs? N Y  Dressing or bathing? N N  Doing errands, shopping? N N  Preparing Food and eating ? N -  Using the Toilet? N -  In the past six months, have you accidently leaked urine? N -  Do you have problems with loss of bowel control? N -  Managing your Medications? N -  Managing your Finances? N -  Housekeeping or managing your Housekeeping? N -  Some recent data might be hidden    Patient Care Team: Ria Bush, MD as PCP - General (Family Medicine) Minna Merritts, MD as Consulting Physician (Cardiology)   Assessment:     Hearing Screening   125Hz  250Hz  500Hz  1000Hz  2000Hz  3000Hz  4000Hz  6000Hz  8000Hz   Right ear:   40 40 40  0    Left ear:   40 40 40  0    Vision Screening Comments: Last vision exam in July 2017 with Dr.  George Ina   Exercise Activities and Dietary recommendations Current Exercise Habits: Home exercise routine, Type of exercise: walking;Other - see comments (aerobics), Time (Minutes): 60, Frequency (Times/Week): 4, Weekly Exercise (Minutes/Week): 240, Intensity: Moderate, Exercise limited by: None identified  Goals    . Increase physical activity          Starting 02/18/17, I will continue to exercise at least 60 min 4 days per week.       Fall Risk Fall Risk  02/18/2017 10/21/2015 08/28/2015  Falls in the past year? No No No   Depression Screen [facility] 2/9 Scores 02/18/2017 11/20/2015 10/21/2015 08/28/2015  PHQ - 2 Score 0 2 0 0  PHQ- 9 Score - 3 - 2    Cognitive Function MMSE - Mini Mental State Exam 02/18/2017  Orientation to time 5  Orientation to Place 5  Registration 3  Attention/ Calculation 0  Recall 3  Language- name 2 objects 0  Language- repeat 1  Language- follow 3 step command 3  Language- read & follow direction 0  Write a sentence 0  Copy design 0  Total score 20     PLEASE NOTE: A Mini-Cog screen was completed. Maximum score is 20. A value of 0 denotes this part of [place] MMSE was not completed or the patient failed this part of the Mini-Cog screening.   Mini-Cog Screening Orientation to Time - Max 5 pts Orientation to Place - Max 5 pts Registration - Max 3 pts Recall - Max 3 pts Language Repeat - Max 1 pts Language Follow 3 Step Command - Max 3 pts     Immunization History  Administered Date(s) Administered  . Influenza, Seasonal, Injecte, Preservative Fre 07/20/2016  . Influenza,inj,Quad PF,36+ Mos 08/12/2015  . Pneumococcal Conjugate-13 01/08/2014  . Pneumococcal Polysaccharide-23 08/02/2012, 08/01/2015  . Tdap 05/02/2015  . Zoster 11/02/2005   Screening Tests Health Maintenance  Topic Date Due  . FOOT EXAM  02/23/2017 (Originally 08/10/1951)  . COLONOSCOPY  03/01/2018 (Originally 08/10/1993)  . INFLUENZA VACCINE  06/02/2017  . OPHTHALMOLOGY  EXAM  06/02/2017  . HEMOGLOBIN A1C  08/20/2017  . DTaP/Tdap/Td (2 - Td) 05/01/2025  . TETANUS/TDAP  05/01/2025  . PNA vac Low Risk Adult  Completed      Plan:     I have personally reviewed and addressed the Medicare Annual Wellness questionnaire and have noted the following in the patient's chart:  A. Medical and social history B. Use of alcohol, tobacco or illicit drugs  C. Current medications and supplements D. Functional ability and status E.  Nutritional status F.  Physical activity G. Advance directives H. List of other physicians I.  Hospitalizations, surgeries, and ER visits in previous 12 months J.  [place] to include hearing, vision, cognitive, depression L. Referrals and appointments - none  In addition, I have reviewed and discussed with patient certain preventive protocols, quality metrics, and best practice recommendations. A written personalized care plan for preventive services as well as general preventive health recommendations were provided to patient.  See attached scanned questionnaire for additional information.   Signed,   Lindell Noe, MHA, BS, LPN Health Coach

## 2017-02-21 NOTE — Progress Notes (Signed)
I reviewed health advisor's note, was available for consultation, and agree with documentation and plan.  

## 2017-02-23 ENCOUNTER — Ambulatory Visit (INDEPENDENT_AMBULATORY_CARE_PROVIDER_SITE_OTHER): Payer: Medicare Other | Admitting: Family Medicine

## 2017-02-23 ENCOUNTER — Encounter: Payer: Self-pay | Admitting: Family Medicine

## 2017-02-23 VITALS — BP 124/70 | HR 80 | Temp 98.1°F | Wt 230.0 lb

## 2017-02-23 DIAGNOSIS — H18519 Endothelial corneal dystrophy, unspecified eye: Secondary | ICD-10-CM

## 2017-02-23 DIAGNOSIS — Z0000 Encounter for general adult medical examination without abnormal findings: Secondary | ICD-10-CM

## 2017-02-23 DIAGNOSIS — E119 Type 2 diabetes mellitus without complications: Secondary | ICD-10-CM

## 2017-02-23 DIAGNOSIS — E559 Vitamin D deficiency, unspecified: Secondary | ICD-10-CM | POA: Diagnosis not present

## 2017-02-23 DIAGNOSIS — I2510 Atherosclerotic heart disease of native coronary artery without angina pectoris: Secondary | ICD-10-CM

## 2017-02-23 DIAGNOSIS — H1851 Endothelial corneal dystrophy: Secondary | ICD-10-CM | POA: Diagnosis not present

## 2017-02-23 DIAGNOSIS — Z7189 Other specified counseling: Secondary | ICD-10-CM

## 2017-02-23 DIAGNOSIS — E782 Mixed hyperlipidemia: Secondary | ICD-10-CM

## 2017-02-23 DIAGNOSIS — N529 Male erectile dysfunction, unspecified: Secondary | ICD-10-CM | POA: Diagnosis not present

## 2017-02-23 DIAGNOSIS — I255 Ischemic cardiomyopathy: Secondary | ICD-10-CM

## 2017-02-23 DIAGNOSIS — E669 Obesity, unspecified: Secondary | ICD-10-CM | POA: Diagnosis not present

## 2017-02-23 DIAGNOSIS — I10 Essential (primary) hypertension: Secondary | ICD-10-CM

## 2017-02-23 HISTORY — DX: Encounter for general adult medical examination without abnormal findings: Z00.00

## 2017-02-23 MED ORDER — SILDENAFIL CITRATE 20 MG PO TABS: 40.0000 mg | ORAL_TABLET | Freq: Every day | ORAL | 3 refills | Status: DC | PRN

## 2017-02-23 NOTE — Progress Notes (Signed)
Pre visit review using our clinic review tool, if applicable. No additional management support is needed unless otherwise documented below in the visit note. 

## 2017-02-23 NOTE — Patient Instructions (Addendum)
We will sign you up for cologuard. May get shingrix at local pharmacy - check first about coverage.  Keep close eye on diet, continue working on weight loss. You are doing well today.  Return as needed or in 6 months for diabetes check.   Health Maintenance, Male A healthy lifestyle and preventive care is important for your health and wellness. Ask your health care provider about what schedule of regular examinations is right for you. What should I know about weight and diet?  Eat a Healthy Diet  Eat plenty of vegetables, fruits, whole grains, low-fat dairy products, and lean protein.  Do not eat a lot of foods high in solid fats, added sugars, or salt. Maintain a Healthy Weight  Regular exercise can help you achieve or maintain a healthy weight. You should:  Do at least 150 minutes of exercise each week. The exercise should increase your heart rate and make you sweat (moderate-intensity exercise).  Do strength-training exercises at least twice a week. Watch Your Levels of Cholesterol and Blood Lipids  Have your blood tested for lipids and cholesterol every 5 years starting at 74 years of age. If you are at high risk for heart disease, you should start having your blood tested when you are 73 years old. You may need to have your cholesterol levels checked more often if:  Your lipid or cholesterol levels are high.  You are older than 74 years of age.  You are at high risk for heart disease. What should I know about cancer screening? Many types of cancers can be detected early and may often be prevented. Lung Cancer  You should be screened every year for lung cancer if:  You are a current smoker who has smoked for at least 30 years.  You are a former smoker who has quit within the past 15 years.  Talk to your health care provider about your screening options, when you should start screening, and how often you should be screened. Colorectal Cancer  Routine colorectal cancer  screening usually begins at 74 years of age and should be repeated every 5-10 years until you are 74 years old. You may need to be screened more often if early forms of precancerous polyps or small growths are found. Your health care provider may recommend screening at an earlier age if you have risk factors for colon cancer.  Your health care provider may recommend using home test kits to check for hidden blood in the stool.  A small camera at the end of a tube can be used to examine your colon (sigmoidoscopy or colonoscopy). This checks for the earliest forms of colorectal cancer. Prostate and Testicular Cancer  Depending on your age and overall health, your health care provider may do certain tests to screen for prostate and testicular cancer.  Talk to your health care provider about any symptoms or concerns you have about testicular or prostate cancer. Skin Cancer  Check your skin from head to toe regularly.  Tell your health care provider about any new moles or changes in moles, especially if:  There is a change in a mole's size, shape, or color.  You have a mole that is larger than a pencil eraser.  Always use sunscreen. Apply sunscreen liberally and repeat throughout the day.  Protect yourself by wearing long sleeves, pants, a wide-brimmed hat, and sunglasses when outside. What should I know about heart disease, diabetes, and high blood pressure?  If you are 75-44 years of age, have  your blood pressure checked every 3-5 years. If you are 37 years of age or older, have your blood pressure checked every year. You should have your blood pressure measured twice-once when you are at a hospital or clinic, and once when you are not at a hospital or clinic. Record the average of the two measurements. To check your blood pressure when you are not at a hospital or clinic, you can use:  An automated blood pressure machine at a pharmacy.  A home blood pressure monitor.  Talk to your health  care provider about your target blood pressure.  If you are between 5-30 years old, ask your health care provider if you should take aspirin to prevent heart disease.  Have regular diabetes screenings by checking your fasting blood sugar level.  If you are at a normal weight and have a low risk for diabetes, have this test once every three years after the age of 25.  If you are overweight and have a high risk for diabetes, consider being tested at a younger age or more often.  A one-time screening for abdominal aortic aneurysm (AAA) by ultrasound is recommended for men aged 53-75 years who are current or former smokers. What should I know about preventing infection? Hepatitis B  If you have a higher risk for hepatitis B, you should be screened for this virus. Talk with your health care provider to find out if you are at risk for hepatitis B infection. Hepatitis C  Blood testing is recommended for:  Everyone born from [birth year] through 1963.  Anyone with known risk factors for hepatitis C. Sexually Transmitted Diseases (STDs)  You should be screened each year for STDs including gonorrhea and chlamydia if:  You are sexually active and are younger than 74 years of age.  You are older than 74 years of age and your health care provider tells you that you are at risk for this type of infection.  Your sexual activity has changed since you were last screened and you are at an increased risk for chlamydia or gonorrhea. Ask your health care provider if you are at risk.  Talk with your health care provider about whether you are at high risk of being infected with HIV. Your health care provider may recommend a prescription medicine to help prevent HIV infection. What else can I do?  Schedule regular health, dental, and eye exams.  Stay current with your vaccines (immunizations).  Do not use any tobacco products, such as cigarettes, chewing tobacco, and e-cigarettes. If you need help quitting,  ask your health care provider.  Limit alcohol intake to no more than 2 drinks per day. One drink equals 12 ounces of beer, 5 ounces of wine, or 1 ounces of hard liquor.  Do not use street drugs.  Do not share needles.  Ask your health care provider for help if you need support or information about quitting drugs.  Tell your health care provider if you often feel depressed.  Tell your health care provider if you have ever been abused or do not feel safe at home. This information is not intended to replace advice given to you by your health care provider. Make sure you discuss any questions you have with your health care provider. Document Released: 04/16/2008 Document Revised: 06/17/2016 Document Reviewed: 07/23/2015 Elsevier Interactive Patient Education  2017 [employer].

## 2017-02-23 NOTE — Assessment & Plan Note (Signed)
s/p several procedures including corneal transplant.

## 2017-02-23 NOTE — Assessment & Plan Note (Signed)
Chronic, stable. Continue current regimen. 

## 2017-02-23 NOTE — Assessment & Plan Note (Signed)
Discussed healthy diet and lifestyle changes to affect sustainable weight loss  

## 2017-02-23 NOTE — Assessment & Plan Note (Signed)
Advanced directive discussion - scanned and in chart 07/17/2015. HCPOA is Georgi.

## 2017-02-23 NOTE — Assessment & Plan Note (Signed)
Appreciate cards care of patient.  

## 2017-02-23 NOTE — Assessment & Plan Note (Signed)
Discussed with patient progression from prediabetes to frank diabetes. Foot exam today. RTC 6 mo DM f/u visit.

## 2017-02-23 NOTE — Assessment & Plan Note (Signed)
Improving - continue 1000 IU daily.

## 2017-02-23 NOTE — Progress Notes (Signed)
BP 124/70   Pulse 80   Temp 98.1 F (36.7 C) (Oral)   Wt 230 lb (104.3 kg)   BMI 31.63 kg/m    CC: CPE Subjective:    Patient ID: Shannon Chung, male    DOB: [birth year], 76 y.o.   MRN: [id number]  HPI: Shannon Chung is a 76 y.o. male presenting on 02/23/2017 for Annual Exam   Saw Katha Cabal last week for medicare wellness visit. Note reviewed.  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 Great success with right, still with residual symptoms of left side.   Great report from Dr Rockey Situ last month.  Some ED trouble maintaining erection - asks about viagra. Previously did not tolerate levitra. viagra was more effective than cialis.   Preventative: Colonoscopy WNL 2007 done in [place]. Records never received. Discussed options - would like cologuard. Prostate cancer screening - discussed. Mild nocturia. Will screen today.  Lung cancer screening - not eligible  Flu shot - yearly.  Tdap 2016  Pneumovax 2013, prevnar 12/2013  zostavax - 2007. Discussed shingrix - he will check with [facility].  Advanced directive discussion - scanned and in chart 07/17/2015. HCPOA is Georgie.  Seat belt use discussed Sunscreen use discussed, no changing moles. Sees dermatology.  Ex smoker quit 1980 Alcohol - 1-2 glasses wine on weekends  Lives with fiancee for 41yrs Metta Clines) divorced Occupation Retired Nurse, mental health Edu: 1 yr college Activity: volunteers at [employer]: good water, fruits/vegetables daily  Relevant past medical, surgical, family and social history reviewed and updated as indicated. Interim medical history since our last visit reviewed. Allergies and medications reviewed and updated. Outpatient Medications Prior to Visit  Medication Sig Dispense Refill  . acetaminophen (TYLENOL) 500 MG tablet Take 500 mg by mouth every 6 (six) hours as needed for mild pain.     Marland Kitchen aspirin (ASPIRIN EC) 81 MG EC tablet Take 162  mg by mouth daily. Swallow whole.     Marland Kitchen atorvastatin (LIPITOR) 40 MG tablet Take 1 tablet (40 mg total) by mouth daily. (Patient taking differently: Take 40 mg by mouth at bedtime. ) 90 tablet 3  . Cholecalciferol (VITAMIN D3) 1000 UNITS CAPS Take 1 capsule (1,000 Units total) by mouth daily. 30 capsule   . fluticasone (FLONASE) 50 MCG/ACT nasal spray Place 1 spray into both nostrils as needed for allergies. Reported on 03/18/2016    . lansoprazole (PREVACID) 30 MG capsule Take 1 capsule (30 mg total) by mouth every morning. 90 capsule 3  . lisinopril (PRINIVIL,ZESTRIL) 2.5 MG tablet TAKE 1 TABLET (2.5 MG TOTAL) BY MOUTH DAILY. 90 tablet 3  . metoprolol succinate (TOPROL-XL) 25 MG 24 hr tablet TAKE 1 TABLET (25 MG TOTAL) BY MOUTH DAILY. 90 tablet 3  . ranitidine (ZANTAC) 150 MG tablet TAKE 1 TABLET (150 MG TOTAL) BY MOUTH AT BEDTIME. 180 tablet 1  . vitamin B-12 (CYANOCOBALAMIN) 500 MCG tablet Take 500 mcg by mouth daily. Reported on 03/17/2016     No facility-administered medications prior to visit.      Per HPI unless specifically indicated in ROS section below Review of Systems  Constitutional: Negative for activity change, appetite change, chills, fatigue, fever and unexpected weight change.  HENT: Negative for hearing loss.   Eyes: Positive for visual disturbance.  Respiratory: Negative for cough, chest tightness, shortness of breath and wheezing.   Cardiovascular: Negative for chest pain, palpitations and leg swelling.  Gastrointestinal: Positive for constipation (mild -  narcotic related). Negative for abdominal distention, abdominal pain, blood in stool, diarrhea, nausea and vomiting.  Genitourinary: Negative for difficulty urinating and hematuria.  Musculoskeletal: Negative for arthralgias, myalgias and neck pain.  Skin: Negative for rash.  Neurological: Negative for dizziness, seizures, syncope and headaches.  Hematological: Negative for adenopathy. Does not bruise/bleed easily.    Psychiatric/Behavioral: Negative for dysphoric mood. The patient is not nervous/anxious.        Objective:    BP 124/70   Pulse 80   Temp 98.1 F (36.7 C) (Oral)   Wt 230 lb (104.3 kg)   BMI 31.63 kg/m   Wt Readings from Last 3 Encounters:  02/23/17 230 lb (104.3 kg)  02/18/17 228 lb 8 oz (103.6 kg)  01/25/17 231 lb 8 oz (105 kg)    Physical Exam  Constitutional: He is oriented to person, place, and time. He appears well-developed and well-nourished. No distress.  HENT:  Head: Normocephalic and atraumatic.  Right Ear: Hearing, tympanic membrane, external ear and ear canal normal.  Left Ear: Hearing, tympanic membrane, external ear and ear canal normal.  Nose: Nose normal.  Mouth/Throat: Uvula is midline, oropharynx is clear and moist and mucous membranes are normal. No oropharyngeal exudate, posterior oropharyngeal edema or posterior oropharyngeal erythema.  Eyes: Conjunctivae and EOM are normal. Pupils are equal, round, and reactive to light. No scleral icterus.  Neck: Normal range of motion. Neck supple. Carotid bruit is not present. No thyromegaly present.  Cardiovascular: Normal rate, regular rhythm, normal heart sounds and intact distal pulses.   No murmur heard. Pulses:      Radial pulses are 2+ on the right side, and 2+ on the left side.  Pulmonary/Chest: Effort normal and breath sounds normal. No respiratory distress. He has no wheezes. He has no rales.  Abdominal: Soft. Bowel sounds are normal. He exhibits no distension and no mass. There is no tenderness. There is no rebound and no guarding.  Musculoskeletal: Normal range of motion. He exhibits no edema.  See HPI for foot exam if done  Lymphadenopathy:    He has no cervical adenopathy.  Neurological: He is alert and oriented to person, place, and time.  CN grossly intact, station and gait intact  Skin: Skin is warm and dry. No rash noted.  Psychiatric: He has a normal mood and affect. His behavior is normal.  Judgment and thought content normal.  Nursing note and vitals reviewed.  Results for orders placed or performed in visit on 02/18/17  VITAMIN D 25 Hydroxy (Vit-D Deficiency, Fractures)  Result Value Ref Range   VITD 27.29 (L) 30.00 - 100.00 ng/mL  Lipid panel  Result Value Ref Range   Cholesterol 123 0 - 200 mg/dL   Triglycerides 97.0 0.0 - 149.0 mg/dL   HDL 45.10 >39.00 mg/dL   VLDL 19.4 0.0 - 40.0 mg/dL   LDL Cholesterol 58 0 - 99 mg/dL   Total CHOL/HDL Ratio 3    NonHDL 14.97   Basic metabolic panel  Result Value Ref Range   Sodium 141 135 - 145 mEq/L   Potassium 4.3 3.5 - 5.1 mEq/L   Chloride 105 96 - 112 mEq/L   CO2 28 19 - 32 mEq/L   Glucose, Bld 103 (H) 70 - 99 mg/dL   BUN 16 6 - 23 mg/dL   Creatinine, Ser 0.90 0.40 - 1.50 mg/dL   Calcium 9.7 8.4 - 10.5 mg/dL   GFR 87.79 >60.00 mL/min  CBC with Differential/Platelet  Result Value Ref Range  WBC 6.0 4.0 - 10.5 K/uL   RBC 4.46 4.22 - 5.81 Mil/uL   Hemoglobin 14.2 13.0 - 17.0 g/dL   HCT 42.8 39.0 - 52.0 %   MCV 95.9 78.0 - 100.0 fl   MCHC 33.2 30.0 - 36.0 g/dL   RDW 14.2 11.5 - 15.5 %   Platelets 183.0 150.0 - 400.0 K/uL   Neutrophils Relative % 50.1 43.0 - 77.0 %   Lymphocytes Relative 38.0 12.0 - 46.0 %   Monocytes Relative 8.4 3.0 - 12.0 %   Eosinophils Relative 2.4 0.0 - 5.0 %   Basophils Relative 1.1 0.0 - 3.0 %   Neutro Abs 3.0 1.4 - 7.7 K/uL   Lymphs Abs 2.3 0.7 - 4.0 K/uL   Monocytes Absolute 0.5 0.1 - 1.0 K/uL   Eosinophils Absolute 0.1 0.0 - 0.7 K/uL   Basophils Absolute 0.1 0.0 - 0.1 K/uL  Hemoglobin A1c  Result Value Ref Range   Hgb A1c MFr Bld 6.9 (H) 4.6 - 6.5 %  PSA, Medicare  Result Value Ref Range   PSA 3.17 0.10 - 4.00 ng/ml      Assessment & Plan:   Problem List Items Addressed This Visit    Advanced care planning/counseling discussion    Advanced directive discussion - scanned and in chart 07/17/2015. HCPOA is Georgi.       Coronary artery disease    Appreciate cards care of  patient.      Relevant Medications   sildenafil (REVATIO) 20 MG tablet   Diet-controlled diabetes mellitus ([place])    Discussed with patient progression from prediabetes to frank diabetes. Foot exam today. RTC 6 mo DM f/u visit.       Erectile dysfunction    Vasculogenic. Discussed PDE5 inhibitors. Will treat with generic sildenafil. Discussed off-label use.  Pt aware cannot take nitrates while on sildenafil. To stop and notify us right away if any chest pain with relations.      Essential hypertension    Chronic, stable. Continue current regimen.       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       Health maintenance examination - Primary    Preventative protocols reviewed and updated unless pt declined. Discussed healthy diet and lifestyle.       Hyperlipidemia    Chronic, stable. Continue current regimen.       Relevant Medications   sildenafil (REVATIO) 20 MG tablet   Ischemic cardiomyopathy    Appreciate cards care of patient.       Relevant Medications   sildenafil (REVATIO) 20 MG tablet   Obesity (BMI 30.0-34.9)    Discussed healthy diet and lifestyle changes to affect sustainable weight loss.       Vitamin D deficiency    Improving - continue 1000 IU daily.           Follow up plan: Return in about 6 months (around 08/25/2017) for follow up visit.  Ria Bush, MD

## 2017-02-23 NOTE — Assessment & Plan Note (Signed)
Preventative protocols reviewed and updated unless pt declined. Discussed healthy diet and lifestyle.  

## 2017-02-23 NOTE — Assessment & Plan Note (Signed)
Vasculogenic. Discussed PDE5 inhibitors. Will treat with generic sildenafil. Discussed off-label use.  Pt aware cannot take nitrates while on sildenafil. To stop and notify us right away if any chest pain with relations.

## 2017-02-25 ENCOUNTER — Other Ambulatory Visit: Payer: Self-pay | Admitting: Cardiovascular Disease

## 2017-03-08 ENCOUNTER — Encounter: Payer: Self-pay | Admitting: Family Medicine

## 2017-07-27 ENCOUNTER — Ambulatory Visit: Payer: Medicare Other | Admitting: Cardiovascular Disease

## 2017-08-03 ENCOUNTER — Other Ambulatory Visit: Payer: Self-pay | Admitting: Family Medicine

## 2017-08-26 ENCOUNTER — Ambulatory Visit: Payer: Medicare Other | Admitting: Family Medicine

## 2017-08-30 NOTE — Progress Notes (Signed)
Cardiology Office Note  Date:  08/31/2017   ID:  Shannon Chung, DOB 06-25-1941, MRN [id number]  PCP:  Ria Bush, MD   Chief Complaint  Patient presents with  . other    6 month follow up. Patient states he is doing well. Meds reviewed verbally with patient.     HPI:  Shannon Chung is a 76 year old male with  coronary artery disease,   severe multivessel disease, with CABG at [facility] August 2016,  ischemic cardiomyopathy,  initial ejection fraction in July 2015 of 35%, up to 55% after CABG  cardiac PET scan showing scar in the apical and periapical region,  mural thrombus seen in July 2015, started on anticoagulation,   hyperlipidemia,   who presents for routine follow-up of his coronary artery disease   Active, goes to the gym on a regular basis Denies chest pain or shortness of breath on exertion Overall has no complaints Tolerating his medications Does not check his blood pressure at home  Labs reviewed with him, total cholesterol 123, LDL in the 50s  EKG personally reviewed by myself on todays visit   Shows normal sinus rhythm rate 73 bpm consider old anterior MI, T wave abnormality V3 through V6, 1 and aVL   Other past medical history reviewed Echocardiogram showing ejection fraction greater than 55% (improved after revascularization)   total left knee replacement by Dr. Earnestine Leys   Previously noted to have abnormal EKG and then was referred to cardiology. They had talked about doing a cardiac catheterization but instead did a PET viability study.   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  PSH:    Past Surgical History:  Procedure Laterality Date  . BICEPS TENDON REPAIR Right 1993  . CARDIAC CATHETERIZATION N/A 07/05/2015   Procedure: Left Heart Cath and Coronary Angiography;  Surgeon: Minna Merritts, MD;  Location: [facility];  Service: Cardiovascular;  Laterality: N/A;  . CATARACT EXTRACTION Left 12/2016   with keratoplasty  . COLONOSCOPY  2007  . CORONARY ARTERY BYPASS GRAFT N/A 07/29/2015   Procedure: CORONARY ARTERY BYPASS GRAFTING (CABG) x 5 (LIMA to LAD, SVG to DIAGONAL, SVG SEQUENTIALLY to OM1 and OM2, SVG to OM3) with Endoscopic Vein Havesting of GREATER SAPHENOUS VEIN from RIGHT THIGH and partial LOWER LEG ;  Surgeon: [name], MD;  Location: [facility];  Service: Open Heart Surgery;  Laterality: N/A;  . JOINT REPLACEMENT    . KNEE ARTHROSCOPY Left remote  . SKIN CANCER EXCISION  10/2015   BCC - L ala (pending MOHs) and L scapula (complete excision)  . TEE WITHOUT CARDIOVERSION N/A 07/29/2015   Procedure: TRANSESOPHAGEAL ECHOCARDIOGRAM (TEE);  Surgeon: [name], MD;  Location: [facility];  Service: Open Heart Surgery;  Laterality: N/A;  . TONSILLECTOMY  1949  . TOTAL KNEE ARTHROPLASTY Left  03/18/2016   cemented L TKR; Earnestine Leys, MD    Current Outpatient Prescriptions  Medication Sig Dispense Refill  . acetaminophen (TYLENOL) 500 MG tablet Take 500 mg by mouth every 6 (six) hours as needed for mild pain.     Marland Kitchen aspirin (ASPIRIN EC) 81 MG EC tablet Take 162 mg by mouth daily. Swallow  whole.     Marland Kitchen atorvastatin (LIPITOR) 40 MG tablet TAKE 1 TABLET (40 MG TOTAL) BY MOUTH DAILY. 90 tablet 3  . Cholecalciferol (VITAMIN D3) 1000 UNITS CAPS Take 1 capsule (1,000 Units total) by mouth daily. 30 capsule   . fluticasone (FLONASE) 50 MCG/ACT nasal spray Place 1 spray into both nostrils as needed for allergies. Reported on 03/18/2016    . lansoprazole (PREVACID) 30 MG capsule Take 1 capsule (30 mg total) by mouth every morning. 90 capsule 3  . lisinopril (PRINIVIL,ZESTRIL) 2.5 MG tablet TAKE 1 TABLET (2.5 MG TOTAL) BY MOUTH DAILY. 90 tablet 3  . metoprolol succinate (TOPROL-XL) 25 MG 24 hr tablet TAKE 1 TABLET (25 MG TOTAL) BY MOUTH DAILY. 90 tablet 3  . ranitidine (ZANTAC) 150 MG tablet TAKE 1 TABLET (150 MG TOTAL) BY MOUTH AT BEDTIME. 180 tablet 0  . sildenafil (REVATIO) 20 MG tablet Take 2-4 tablets (40-80 mg total) by mouth daily as needed (relations). 30 tablet 3  . vitamin B-12 (CYANOCOBALAMIN) 500 MCG tablet Take 500 mcg by mouth daily. Reported on 03/17/2016     No current facility-administered medications for this visit.      Allergies:   Patient has no known allergies.   Social History:  The patient  reports that he quit smoking about 38 years ago. His smoking use included Cigarettes. He has a 5.00 pack-year smoking history. He has never used smokeless tobacco. He reports that he drinks alcohol. He reports that he does not use drugs.   Family History:   family history includes Alcoholism in his father; CAD (age of onset: 72) in his father; Diabetes in his father; Hyperlipidemia in his father; Hypertension in his father.    Review of Systems: Review of Systems  Constitutional: Negative.   Respiratory: Negative.   Cardiovascular: Negative.   Gastrointestinal: Negative.   Musculoskeletal: Positive for joint pain.  Neurological: Negative.   Psychiatric/Behavioral: Negative.   All other systems reviewed and are negative.    PHYSICAL EXAM: VS:  BP 136/70 (BP Location:  Left Arm, Patient Position: Sitting, Cuff Size: Normal)   Pulse 73   Ht 6' (1.829 m)   Wt 232 lb 8 oz (105.5 kg)   BMI 31.53 kg/m  , BMI Body mass index is 31.53 kg/m.  No change to physical exam GEN: Well nourished, well developed, in no acute distress  HEENT: normal  Neck: no JVD, carotid bruits, or masses Cardiac: RRR; no murmurs, rubs, or gallops,no edema  Respiratory:  clear to auscultation bilaterally, normal work of breathing GI: soft, nontender, nondistended, + BS MS: no deformity or atrophy  Skin: warm and dry, no rash Neuro:  Strength and sensation are intact Psych: euthymic mood, full affect    Recent Labs: 02/18/2017: BUN 16; Creatinine, Ser 0.90; Hemoglobin 14.2; Platelets 183.0; Potassium 4.3; Sodium 141    Lipid Panel Lab Results  Component Value Date   CHOL 123 02/18/2017   HDL 45.10 02/18/2017   LDLCALC 58 02/18/2017   TRIG 97.0 02/18/2017      Wt Readings from Last 3 Encounters:  08/31/17 232 lb 8 oz (105.5 kg)  02/23/17 230  lb (104.3 kg)  02/18/17 228 lb 8 oz (103.6 kg)       ASSESSMENT AND PLAN:  Ischemic cardiomyopathy - Plan: EKG 12-Lead Ejection fraction improved after bypass surgery Appears euvolemic, Recommended he monitor blood pressure closely at home, we would increase dose of lisinopril for systolic pressure more than 130  Mixed hyperlipidemia - Plan: EKG 12-Lead Cholesterol is at goal on the current lipid regimen. No changes to the medications were made. Stable  Essential hypertension - Plan: EKG 12-Lead Recommended he closely monitor pressure If this does run mildly high, would increase lisinopril dosing to meet guidelines systolic pressure less than 130  Hx of CABG - Plan: EKG 12-Lead Discussed previous surgical report with him Stable  Obesity We have encouraged continued exercise, careful diet management in an effort to lose weight. Slight trending up in his weight   Total encounter time more than 25 minutes  Greater  than 50% was spent in counseling and coordination of care with the patient   Disposition:   F/U  12 months   No orders of the defined types were placed in this encounter.    Signed, Esmond Plants, M.D., Ph.D. 08/31/2017  [place], [place]

## 2017-08-31 ENCOUNTER — Encounter: Payer: Self-pay | Admitting: Cardiovascular Disease

## 2017-08-31 ENCOUNTER — Ambulatory Visit (INDEPENDENT_AMBULATORY_CARE_PROVIDER_SITE_OTHER): Payer: Medicare Other | Admitting: Cardiovascular Disease

## 2017-08-31 VITALS — BP 136/70 | HR 73 | Ht 72.0 in | Wt 232.5 lb

## 2017-08-31 DIAGNOSIS — I25118 Atherosclerotic heart disease of native coronary artery with other forms of angina pectoris: Secondary | ICD-10-CM

## 2017-08-31 DIAGNOSIS — I255 Ischemic cardiomyopathy: Secondary | ICD-10-CM

## 2017-08-31 DIAGNOSIS — Z951 Presence of aortocoronary bypass graft: Secondary | ICD-10-CM

## 2017-08-31 DIAGNOSIS — I10 Essential (primary) hypertension: Secondary | ICD-10-CM | POA: Diagnosis not present

## 2017-08-31 DIAGNOSIS — E782 Mixed hyperlipidemia: Secondary | ICD-10-CM

## 2017-08-31 NOTE — Patient Instructions (Signed)

## 2017-09-02 ENCOUNTER — Telehealth: Payer: Self-pay | Admitting: Family Medicine

## 2017-09-02 NOTE — Telephone Encounter (Signed)
Copied from [place] #[id number]. >> Sep 02, 2017  3:05 PM Neva Seat wrote: Pt states that Dr. Danise Mina said pt will be signed up for cologuard.  Pt  was wondering why he hasn't been signed up.

## 2017-09-02 NOTE — Telephone Encounter (Signed)
Copied from [place] [id number]. Topic: General - Other >> Sep 02, 2017  3:05 PM Neva Seat wrote: Pt states that Dr. Danise Mina said pt will be signed up for cologuard.  Pt  was wondering why he hasn't been signed up.

## 2017-09-02 NOTE — Telephone Encounter (Signed)
Faxed Cologaurd info today pt is aware.

## 2017-09-14 ENCOUNTER — Encounter: Payer: Self-pay | Admitting: Family Medicine

## 2017-09-15 ENCOUNTER — Ambulatory Visit: Payer: Medicare Other | Admitting: Family Medicine

## 2017-09-15 ENCOUNTER — Encounter: Payer: Self-pay | Admitting: Family Medicine

## 2017-09-15 VITALS — BP 124/70 | HR 60 | Temp 97.8°F | Wt 235.0 lb

## 2017-09-15 DIAGNOSIS — E559 Vitamin D deficiency, unspecified: Secondary | ICD-10-CM

## 2017-09-15 DIAGNOSIS — E119 Type 2 diabetes mellitus without complications: Secondary | ICD-10-CM

## 2017-09-15 LAB — BASIC METABOLIC PANEL
BUN: 13 mg/dL (ref 6–23)
CALCIUM: 10 mg/dL (ref 8.4–10.5)
CO2: 32 mEq/L (ref 19–32)
CREATININE: 1.05 mg/dL (ref 0.40–1.50)
Chloride: 102 mEq/L (ref 96–112)
GFR: 73.36 mL/min (ref >60.00)
GLUCOSE: 120 mg/dL — AB (ref 70–99)
Potassium: 5.2 mEq/L — ABNORMAL HIGH (ref 3.5–5.1)
SODIUM: 140 meq/L (ref 135–145)

## 2017-09-15 LAB — VITAMIN D 25 HYDROXY (VIT D DEFICIENCY, FRACTURES): VITD: 22.55 ng/mL — ABNORMAL LOW (ref 30.00–100.00)

## 2017-09-15 LAB — COLOGUARD

## 2017-09-15 LAB — HEMOGLOBIN A1C: Hgb A1c MFr Bld: 7.3 % — ABNORMAL HIGH (ref 4.6–6.5)

## 2017-09-15 NOTE — Assessment & Plan Note (Signed)
Update levels then determine vit D dosing need.

## 2017-09-15 NOTE — Progress Notes (Signed)
BP 124/70 (BP Location: Left Arm, Patient Position: Sitting, Cuff Size: Large)   Pulse 60   Temp 97.8 F (36.6 C) (Oral)   Wt 235 lb (106.6 kg)   SpO2 94%   BMI 31.87 kg/m    CC: 6 mo DM f/u visit Subjective:    Patient ID: Shannon Chung, male    DOB: [birth year], 76 y.o.   MRN: [id number]  HPI: Shannon Chung is a 76 y.o. male presenting on 09/15/2017 for 6 mo DM follow-up   DM - does not regularly check sugars. Compliant with antihyperglycemic regimen which includes: diet controlled. Denies low sugars or hypoglycemic symptoms. Denies paresthesias. Last diabetic eye exam due. Pneumovax: 2016. Prevnar: 2015. Glucometer brand: none yet. DSME: discussed - declines at this time. If needed, would like [place].  Lab Results  Component Value Date   HGBA1C 6.9 (H) 02/18/2017   Diabetic Foot Exam - Simple   Simple Foot Form Diabetic Foot exam was performed with the following findings:  Yes 09/15/2017  2:34 PM  Visual Inspection No deformities, no ulcerations, no other skin breakdown bilaterally:  Yes Sensation Testing Intact to touch and monofilament testing bilaterally:  Yes Pulse Check Posterior Tibialis and Dorsalis pulse intact bilaterally:  Yes Comments    No results found for: Derl Barrow    He has gym membership and goes 3 times a week (eliptical and treadmill). Weight gain noted despite this.   Relevant past medical, surgical, family and social history reviewed and updated as indicated. Interim medical history since our last visit reviewed. Allergies and medications reviewed and updated. Outpatient Medications Prior to Visit  Medication Sig Dispense Refill  . acetaminophen (TYLENOL) 500 MG tablet Take 500 mg by mouth every 6 (six) hours as needed for mild pain.     Marland Kitchen aspirin (ASPIRIN EC) 81 MG EC tablet Take 162 mg by mouth daily. Swallow whole.     Marland Kitchen atorvastatin (LIPITOR) 40 MG tablet TAKE 1 TABLET (40 MG TOTAL) BY MOUTH DAILY. 90 tablet 3  .  Cholecalciferol (VITAMIN D3) 1000 UNITS CAPS Take 1 capsule (1,000 Units total) by mouth daily. 30 capsule   . fluticasone (FLONASE) 50 MCG/ACT nasal spray Place 1 spray into both nostrils as needed for allergies. Reported on 03/18/2016    . lansoprazole (PREVACID) 30 MG capsule Take 1 capsule (30 mg total) by mouth every morning. 90 capsule 3  . lisinopril (PRINIVIL,ZESTRIL) 2.5 MG tablet TAKE 1 TABLET (2.5 MG TOTAL) BY MOUTH DAILY. 90 tablet 3  . metoprolol succinate (TOPROL-XL) 25 MG 24 hr tablet TAKE 1 TABLET (25 MG TOTAL) BY MOUTH DAILY. 90 tablet 3  . ranitidine (ZANTAC) 150 MG tablet TAKE 1 TABLET (150 MG TOTAL) BY MOUTH AT BEDTIME. 180 tablet 0  . sildenafil (REVATIO) 20 MG tablet Take 2-4 tablets (40-80 mg total) by mouth daily as needed (relations). 30 tablet 3  . vitamin B-12 (CYANOCOBALAMIN) 500 MCG tablet Take 500 mcg by mouth daily. Reported on 03/17/2016     No facility-administered medications prior to visit.      Per HPI unless specifically indicated in ROS section below Review of Systems     Objective:    BP 124/70 (BP Location: Left Arm, Patient Position: Sitting, Cuff Size: Large)   Pulse 60   Temp 97.8 F (36.6 C) (Oral)   Wt 235 lb (106.6 kg)   SpO2 94%   BMI 31.87 kg/m   Wt Readings from Last 3 Encounters:  09/15/17 235 lb (106.6 kg)  08/31/17 232 lb 8 oz (105.5 kg)  02/23/17 230 lb (104.3 kg)    Physical Exam  Constitutional: He appears well-developed and well-nourished. No distress.  HENT:  Head: Normocephalic and atraumatic.  Right Ear: External ear normal.  Left Ear: External ear normal.  Nose: Nose normal.  Mouth/Throat: Oropharynx is clear and moist. No oropharyngeal exudate.  Eyes: Conjunctivae and EOM are normal. Pupils are equal, round, and reactive to light. No scleral icterus.  Neck: Normal range of motion. Neck supple.  Cardiovascular: Normal rate, regular rhythm, normal heart sounds and intact distal pulses.  No murmur  heard. Pulmonary/Chest: Effort normal and breath sounds normal. No respiratory distress. He has no wheezes. He has no rales.  Musculoskeletal: He exhibits no edema.  See HPI for foot exam if done  Lymphadenopathy:    He has no cervical adenopathy.  Skin: Skin is warm and dry. No rash noted.  Psychiatric: He has a normal mood and affect.  Nursing note and vitals reviewed.  Results for orders placed or performed in visit on 02/18/17  VITAMIN D 25 Hydroxy (Vit-D Deficiency, Fractures)  Result Value Ref Range   VITD 27.29 (L) 30.00 - 100.00 ng/mL  Lipid panel  Result Value Ref Range   Cholesterol 123 0 - 200 mg/dL   Triglycerides 97.0 0.0 - 149.0 mg/dL   HDL 45.10 >39.00 mg/dL   VLDL 19.4 0.0 - 40.0 mg/dL   LDL Cholesterol 58 0 - 99 mg/dL   Total CHOL/HDL Ratio 3    NonHDL 01.02   Basic metabolic panel  Result Value Ref Range   Sodium 141 135 - 145 mEq/L   Potassium 4.3 3.5 - 5.1 mEq/L   Chloride 105 96 - 112 mEq/L   CO2 28 19 - 32 mEq/L   Glucose, Bld 103 (H) 70 - 99 mg/dL   BUN 16 6 - 23 mg/dL   Creatinine, Ser 0.90 0.40 - 1.50 mg/dL   Calcium 9.7 8.4 - 10.5 mg/dL   GFR 87.79 >60.00 mL/min  CBC with Differential/Platelet  Result Value Ref Range   WBC 6.0 4.0 - 10.5 K/uL   RBC 4.46 4.22 - 5.81 Mil/uL   Hemoglobin 14.2 13.0 - 17.0 g/dL   HCT 42.8 39.0 - 52.0 %   MCV 95.9 78.0 - 100.0 fl   MCHC 33.2 30.0 - 36.0 g/dL   RDW 14.2 11.5 - 15.5 %   Platelets 183.0 150.0 - 400.0 K/uL   Neutrophils Relative % 50.1 43.0 - 77.0 %   Lymphocytes Relative 38.0 12.0 - 46.0 %   Monocytes Relative 8.4 3.0 - 12.0 %   Eosinophils Relative 2.4 0.0 - 5.0 %   Basophils Relative 1.1 0.0 - 3.0 %   Neutro Abs 3.0 1.4 - 7.7 K/uL   Lymphs Abs 2.3 0.7 - 4.0 K/uL   Monocytes Absolute 0.5 0.1 - 1.0 K/uL   Eosinophils Absolute 0.1 0.0 - 0.7 K/uL   Basophils Absolute 0.1 0.0 - 0.1 K/uL  Hemoglobin A1c  Result Value Ref Range   Hgb A1c MFr Bld 6.9 (H) 4.6 - 6.5 %  PSA, Medicare  Result Value Ref  Range   PSA 3.17 0.10 - 4.00 ng/ml      Assessment & Plan:   Problem List Items Addressed This Visit    Diet-controlled diabetes mellitus ([place]) - Primary    Chronic, stable. Update A1c today. Discussed lifestyle changes to maintain good glycemic control. Pt declines DSME at this time. He will let me know preferred glucose  meter for Rx.      Relevant Orders   Hemoglobin S8V   Basic metabolic panel   Vitamin D deficiency    Update levels then determine vit D dosing need.       Relevant Orders   VITAMIN D 25 Hydroxy (Vit-D Deficiency, Fractures)       Follow up plan: Return in about 6 months (around 03/15/2018) for annual exam, prior fasting for blood work, medicare wellness visit.  Ria Bush, MD

## 2017-09-15 NOTE — Patient Instructions (Addendum)
Check on preferred glucose meter brand and let me know to send in glucose meter and test strips.  Blood work today.  Good to see you today, call us with questions.  Return in 6 months for medicare wellness visit with Katha Cabal and physical with me

## 2017-09-15 NOTE — Assessment & Plan Note (Addendum)
Chronic, stable. Update A1c today. Discussed lifestyle changes to maintain good glycemic control. Pt declines DSME at this time. He will let me know preferred glucose meter for Rx.

## 2017-09-18 ENCOUNTER — Other Ambulatory Visit: Payer: Self-pay | Admitting: Family Medicine

## 2017-09-18 MED ORDER — VITAMIN D 50 MCG (2000 UT) PO CAPS: 1.0000 | ORAL_CAPSULE | Freq: Every day | ORAL | Status: DC

## 2017-09-18 MED ORDER — METFORMIN HCL 500 MG PO TABS: 500.0000 mg | ORAL_TABLET | Freq: Every day | ORAL | 6 refills | Status: DC

## 2017-09-28 ENCOUNTER — Telehealth: Payer: Self-pay | Admitting: Family Medicine

## 2017-09-28 ENCOUNTER — Encounter: Payer: Self-pay | Admitting: Family Medicine

## 2017-09-28 DIAGNOSIS — R195 Other fecal abnormalities: Secondary | ICD-10-CM

## 2017-09-28 NOTE — Telephone Encounter (Signed)
Colog[facility] called to let us know his test came back positive for blood.  It was faxed on 09/23/17 to the office however they wanted to follow up since it was positive.  A note was routed to the nurse pool making them aware of this.

## 2017-09-30 ENCOUNTER — Telehealth: Payer: Self-pay | Admitting: Family Medicine

## 2017-09-30 MED ORDER — ONETOUCH ULTRASOFT LANCETS MISC: 3 refills | Status: DC

## 2017-09-30 MED ORDER — BLOOD GLUCOSE MONITOR KIT: PACK | 0 refills | Status: DC

## 2017-09-30 MED ORDER — GLUCOSE BLOOD VI STRP: ORAL_STRIP | 3 refills | Status: DC

## 2017-09-30 NOTE — Telephone Encounter (Signed)
Left message on vm per dpr asking pt to call back letting us know the brand of test strips he uses and lancets.  Also, we need to know how many times a day pt checks his blood sugars.

## 2017-09-30 NOTE — Telephone Encounter (Signed)
Copied from [place]. Topic: Quick Communication - See Telephone Encounter >> Sep 30, 2017  2:26 PM Bea Graff, NT wrote: CRM for notification. See Telephone encounter for: Danae Chen from [facility] on [address] calling needing some rxs re-written that do no contain enough information. Test strips and lancets-need a specific brand, quantity, and directions and diagnosis codes. Can not say use as needed, needs to say twice a day or 3 times a day etc. CB# [phone]  09/30/17.

## 2017-10-04 MED ORDER — ONETOUCH ULTRASOFT LANCETS MISC: 3 refills | Status: DC

## 2017-10-04 MED ORDER — GLUCOSE BLOOD VI STRP: ORAL_STRIP | 3 refills | Status: DC

## 2017-10-04 MED ORDER — BLOOD GLUCOSE MONITOR KIT: PACK | 0 refills | Status: DC

## 2017-10-04 NOTE — Addendum Note (Signed)
Addended by: Ria Bush on: 10/04/2017 08:07 AM   Modules accepted: Orders

## 2017-10-04 NOTE — Telephone Encounter (Signed)
I called and spoke with [facility].  plz fax glucose meter Rx to pharmacy. Placed in [employer] box.

## 2017-10-04 NOTE — Telephone Encounter (Signed)
Left message on vm per dpr relaying results and message per Dr. G. 

## 2017-10-04 NOTE — Telephone Encounter (Signed)
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

## 2017-10-04 NOTE — Telephone Encounter (Signed)
See recent email and instructions on strips - patient uses one touch ultra mini silver meter and tests once daily and as needed. He already let us know.

## 2017-10-04 NOTE — Telephone Encounter (Signed)
Faxed rx to [facility]

## 2017-10-04 NOTE — Telephone Encounter (Signed)
plz notify cologuard returned positive which is abnormal - so either had hidden blood in stool or tested for DNA that increases risk of colon cancer or colon polyps. For this reason, recommend referral to GI for colonoscopy for further evaluation. Referral placed.

## 2017-10-04 NOTE — Addendum Note (Signed)
Addended by: Ria Bush on: 10/04/2017 02:00 PM   Modules accepted: Orders

## 2017-10-07 NOTE — Telephone Encounter (Signed)
Shannon Chung placed referral on [place] , patient is aware that they will call to schedule.

## 2017-10-07 NOTE — Telephone Encounter (Signed)
Called patient and had to LVM.

## 2017-10-13 ENCOUNTER — Encounter: Payer: Self-pay | Admitting: Family Medicine

## 2017-10-21 ENCOUNTER — Ambulatory Visit: Payer: Medicare Other | Admitting: Gastroenterology

## 2017-10-22 ENCOUNTER — Other Ambulatory Visit: Payer: Self-pay

## 2017-10-22 ENCOUNTER — Encounter: Payer: Self-pay | Admitting: Gastroenterology

## 2017-10-22 ENCOUNTER — Ambulatory Visit: Payer: Medicare Other | Admitting: Gastroenterology

## 2017-10-22 VITALS — BP 138/85 | HR 72 | Ht 72.0 in | Wt 236.0 lb

## 2017-10-22 DIAGNOSIS — Z1211 Encounter for screening for malignant neoplasm of colon: Secondary | ICD-10-CM

## 2017-10-22 DIAGNOSIS — R195 Other fecal abnormalities: Secondary | ICD-10-CM | POA: Diagnosis not present

## 2017-10-22 NOTE — Progress Notes (Signed)
Shannon Chung [address]  [place]  [place], [place] [address]  Main: [phone]  Fax: [phone]   Gastroenterology Consultation  Referring Provider:     Ria Bush, MD Primary Care Physician:  Ria Bush, MD Primary Gastroenterologist:  Dr. Vonda Chung Reason for Consultation:   Positive cologuard        HPI:   Shannon Chung is a 76 y.o. y/o male referred for consultation & management  by Dr. Ria Bush, MD.  Patient reports history of previous colonoscopy 12 years ago.  He recently moved to the area about 2 years ago.  States no polyps were found and no abnormalities were found in his colonoscopy 12 years ago.  No family history of colon cancer.  Denies any blood in stool or altered bowel habits.  Denies any weight loss.  Denies any nausea vomiting.  Denies any abdominal pain.  Last CBC was done in April 2018 and did not show any evidence of anemia or microcytosis.   History of CABG in 2016.  Is on aspirin daily but not on any other anticoagulants or antiplatelets besides that.  Denies any dysphagia or heartburn.  Past Medical History:  Diagnosis Date  . Coronary artery disease    a. 06/2015 Cardiac CT: Ca score 1103 (84th %'ile);  b. 07/2015 Cath: LM 70, LAD 80p, 100/28m D1 70, D2 95, RI 75, RCA 100p/m;  c. 07/2015 CABG x 5 (LIMA->LAD, VG->Diag, VG->OM1->OM2, VG->OM3).  . Diabetes mellitus without complication ([facility] 98/2707 . Dyslipidemia   . Essential hypertension   . 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 Fuchs' corneal dystrophy 2016   sees Dr KMaudie Mercury . GERD (gastroesophageal reflux disease)   . History of radiation exposure    right vocal cord squamous cell cancer  . Impingement syndrome of right shoulder 10/2015   s/p steroid injection Dr MSabra Heck . Ischemic cardiomyopathy    a. dilated, EF 35% improved to 45-50% (2015);  b. 07/2015 EF 25-35% by LV gram.  . 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  isolated episode, not on [place]  .Marland Kitchen[place]ural thrombus of cardiac apex    a. 06/2014: LV; resolved with coumadin-->no residual on f/u echo, no longer on coumadin.  . Osteoarthritis    a. R-shoulder, L-knee (Sabra Heckortho)  . Skin cancer    squamous and basal, sees derm regularly  . 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 Vitamin D deficiency     Past Surgical History:  Procedure Laterality Date  . BICEPS TENDON REPAIR Right 1993  . CARDIAC CATHETERIZATION N/A 07/05/2015   Procedure: Left Heart Cath and Coronary Angiography;  Surgeon: TMinna Merritts MD;  [facility];  Service: Cardiovascular;  Laterality: N/A;  . CATARACT EXTRACTION Left 12/2016   with keratoplasty  . COLONOSCOPY  2007  . CORONARY ARTERY BYPASS GRAFT N/A 07/29/2015   Procedure: CORONARY ARTERY BYPASS GRAFTING (CABG) x 5 (LIMA to LAD, SVG to DIAGONAL, SVG SEQUENTIALLY to OM1 and OM2, SVG to OM3) with Endoscopic Vein Havesting of GREATER SAPHENOUS VEIN from RIGHT THIGH and partial LOWER LEG ;  Surgeon: BGaye Pollack MD;  [facility];  Service: Open Heart Surgery;  Laterality: N/A;  . JOINT REPLACEMENT    . KNEE ARTHROSCOPY Left remote  . SKIN CANCER EXCISION  10/2015   BCC - L ala (pending MOHs) and L scapula (complete excision)  .  TEE WITHOUT CARDIOVERSION N/A 07/29/2015   Procedure: TRANSESOPHAGEAL ECHOCARDIOGRAM (TEE);  Surgeon: [name], MD;  Location: [facility];  Service: Open Heart Surgery;  Laterality: N/A;  . TONSILLECTOMY  1949  . TOTAL KNEE ARTHROPLASTY Left 03/18/2016   cemented L TKR; Earnestine Leys, MD    Prior to Admission medications   Medication Sig Start Date End Date Taking? Authorizing Provider  acetaminophen (TYLENOL) 500 MG tablet Take 500 mg by mouth every 6 (six) hours as needed for mild pain.    Yes [provider]  aspirin (ASPIRIN EC) 81 MG EC tablet Take 162 mg by mouth daily. Swallow whole.    Yes [provider]  atorvastatin (LIPITOR)  40 MG tablet TAKE 1 TABLET (40 MG TOTAL) BY MOUTH DAILY. 02/25/17  Yes Minna Merritts, MD  blood glucose meter kit and supplies KIT Dispense based on patient and insurance preference (one touch ultra mini silver). Use once daily and as needed E11.9 10/04/17  Yes Ria Bush, MD  Cholecalciferol (VITAMIN D) 2000 units CAPS Take 1 capsule (2,000 Units total) daily by mouth. 09/18/17  Yes Ria Bush, MD  fluticasone [facility]) 50 MCG/ACT nasal spray Place 1 spray into both nostrils as needed for allergies. Reported on 03/18/2016   Yes [provider]  glucose blood test strip Use to check once daily and as needed (onetouch) E11.9 10/04/17  Yes Ria Bush, MD  Lancets [facility] ULTRASOFT) lancets Use to check once daily and as needed E11.9 10/04/17  Yes Ria Bush, MD  lansoprazole (PREVACID) 30 MG capsule Take 1 capsule (30 mg total) by mouth every morning. 08/12/15  Yes Ria Bush, MD  lisinopril (PRINIVIL,ZESTRIL) 2.5 MG tablet TAKE 1 TABLET (2.5 MG TOTAL) BY MOUTH DAILY. 01/05/17  Yes Minna Merritts, MD  metFORMIN (GLUCOPHAGE) 500 MG tablet Take 1 tablet (500 mg total) daily by mouth. 09/18/17  Yes Ria Bush, MD  metoprolol succinate (TOPROL-XL) 25 MG 24 hr tablet TAKE 1 TABLET (25 MG TOTAL) BY MOUTH DAILY. 01/05/17  Yes Minna Merritts, MD  ranitidine (ZANTAC) 150 MG tablet TAKE 1 TABLET (150 MG TOTAL) BY MOUTH AT BEDTIME. 08/03/17  Yes Ria Bush, MD  vitamin B-12 (CYANOCOBALAMIN) 500 MCG tablet Take 500 mcg by mouth daily. Reported on 03/17/2016   Yes [provider]  sildenafil (REVATIO) 20 MG tablet Take 2-4 tablets (40-80 mg total) by mouth daily as needed (relations). Patient not taking: Reported on 10/22/2017 02/23/17   Ria Bush, MD    Family History  Problem Relation Age of Onset  . CAD Father 1       MI  . Hypertension Father   . Hyperlipidemia Father   . Alcoholism Father   . Diabetes Father      Social  History   Tobacco Use  . Smoking status: Former Smoker    Packs/day: 0.50    Years: 10.00    Pack years: 5.00    Types: Cigarettes    Last attempt to quit: 11/02/1978    Years since quitting: 38.9  . Smokeless tobacco: Never Used  Substance Use Topics  . Alcohol use: Yes    Alcohol/week: 0.0 oz    Comment: beer/wine on weekends  . Drug use: No    Allergies as of 10/22/2017  . (No Known Allergies)    Review of Systems:    All systems reviewed and negative except where noted in HPI.   Physical Exam:  BP 138/85   Pulse 72   Ht  6' (1.829 m)   Wt 236 lb (107 kg)   BMI 32.01 kg/m  No LMP for male patient. Psych:  Alert and cooperative. Normal mood and affect. General:   Alert,  Well-developed, well-nourished, pleasant and cooperative in NAD Head:  Normocephalic and atraumatic. Eyes:  Sclera clear, no icterus.   Conjunctiva pink. Ears:  Normal auditory acuity. Nose:  No deformity, discharge, or lesions. Mouth:  No deformity or lesions,oropharynx pink & moist. Neck:  Supple; no masses or thyromegaly. Lungs:  Respirations even and unlabored.  Clear throughout to auscultation.   No wheezes, crackles, or rhonchi. No acute distress. Heart:  Regular rate and rhythm; no murmurs, clicks, rubs, or gallops. Abdomen:  Normal bowel sounds.  No bruits.  Soft, non-tender and non-distended without masses, hepatosplenomegaly or hernias noted.  No guarding or rebound tenderness.    Msk:  Symmetrical without gross deformities. Good, equal movement & strength bilaterally. Pulses:  Normal pulses noted. Extremities:  No clubbing or edema.  No cyanosis. Neurologic:  Alert and oriented x3;  grossly normal neurologically. Skin:  Intact without significant lesions or rashes. No jaundice. Lymph Nodes:  No significant cervical adenopathy. Psych:  Alert and cooperative. Normal mood and affect.   Labs: CBC    Component Value Date/Time   WBC 6.0 02/18/2017 1449   RBC 4.46 02/18/2017 1449   HGB  14.2 02/18/2017 1449   HGB 14.6 11/28/2014   HGB 14.6 11/28/2014   HCT 42.8 02/18/2017 1449   PLT 183.0 02/18/2017 1449   PLT 158 11/28/2014   MCV 95.9 02/18/2017 1449   MCH 33.0 03/21/2016 0340   MCHC 33.2 02/18/2017 1449   RDW 14.2 02/18/2017 1449   LYMPHSABS 2.3 02/18/2017 1449   MONOABS 0.5 02/18/2017 1449   EOSABS 0.1 02/18/2017 1449   BASOSABS 0.1 02/18/2017 1449   CMP     Component Value Date/Time   NA 140 09/15/2017 1440   NA 141 11/28/2014   NA 141 11/28/2014   K 5.2 (H) 09/15/2017 1440   CL 102 09/15/2017 1440   CO2 32 09/15/2017 1440   GLUCOSE 120 (H) 09/15/2017 1440   BUN 13 09/15/2017 1440   BUN 12 11/28/2014   CREATININE 1.05 09/15/2017 1440   CREATININE 0.85 11/28/2014   CREATININE 0.85 11/28/2014   CALCIUM 10.0 09/15/2017 1440   PROT 7.4 01/23/2016 0803   ALBUMIN 4.8 01/23/2016 0803   ALBUMIN 4.0 11/28/2014   AST 30 01/23/2016 0803   AST 21 11/28/2014   AST 21 11/28/2014   ALT 13 01/23/2016 0803   ALT 27 11/28/2014   ALT 27 11/28/2014   ALKPHOS 57 01/23/2016 0803   ALKPHOS 53 11/28/2014   ALKPHOS 53 11/28/2014   BILITOT 0.6 01/23/2016 0803   BILITOT 0.5 11/28/2014   BILITOT 0.5 11/28/2014   GFRNONAA >60 03/19/2016 0314   GFRAA >60 03/19/2016 0314    Imaging Studies: No results found.  Assessment and Plan:   Shannon Chung is a 76 y.o. y/o male has been referred for positive cologuard test with no other clinical symptoms  We will plan for colonoscopy as his last one was 12 years ago and is due for his screening and has a positive cold guard test We will repeat labs as his last BMP a month ago showed an elevated potassium.  Will repeat CBC with this as well. I have asked him to start taking MiraLAX daily 1 week before the colonoscopy if he is having constipation leading up to the colonoscopy.  No  constipation at this time.  Denies any blood in his stool.  I have discussed alternative options, risks & benefits,  which include, but are not  limited to, bleeding, infection, perforation,respiratory complication & drug reaction.  The patient agrees with this plan & written consent will be obtained.     Dr Shannon Chung

## 2017-10-22 NOTE — Patient Instructions (Addendum)
Take Miralax daily starting a week before your colonoscopy if you have constipation at that time. Do not take the Miralax the day before and day of your colonoscopy as you will be taking the colonoscopy prep at that time.  Colonoscopy scheduled for 11/18/17. Labs to be drawn anytime prior to colonoscopy. Will go to [facility] to have labs done.  F/U 6 month.

## 2017-10-23 ENCOUNTER — Encounter: Payer: Self-pay | Admitting: Family Medicine

## 2017-11-01 ENCOUNTER — Other Ambulatory Visit: Payer: Self-pay

## 2017-11-01 DIAGNOSIS — Z1211 Encounter for screening for malignant neoplasm of colon: Secondary | ICD-10-CM

## 2017-11-03 ENCOUNTER — Telehealth: Payer: Self-pay | Admitting: Gastroenterology

## 2017-11-03 ENCOUNTER — Other Ambulatory Visit: Payer: Self-pay

## 2017-11-03 NOTE — Telephone Encounter (Signed)
Patient needs to r/s his procedure.

## 2017-11-29 ENCOUNTER — Other Ambulatory Visit
Admission: RE | Admit: 2017-11-29 | Discharge: 2017-11-29 | Disposition: A | Discharge status: Home / Self Care | Payer: Medicare Other | Source: Ambulatory Visit | Attending: Gastroenterology | Admitting: Gastroenterology

## 2017-11-29 DIAGNOSIS — Z1211 Encounter for screening for malignant neoplasm of colon: Secondary | ICD-10-CM | POA: Diagnosis present

## 2017-11-29 DIAGNOSIS — R195 Other fecal abnormalities: Secondary | ICD-10-CM | POA: Diagnosis present

## 2017-11-29 LAB — CBC WITH DIFFERENTIAL/PLATELET
BASOS PCT: 1 %
Basophils Absolute: 0 10*3/uL (ref 0–0.1)
Eosinophils Absolute: 0.1 10*3/uL (ref 0–0.7)
Eosinophils Relative: 2 %
HEMATOCRIT: 45.3 % (ref 40.0–52.0)
HEMOGLOBIN: 15.1 g/dL (ref 13.0–18.0)
LYMPHS ABS: 2 10*3/uL (ref 1.0–3.6)
Lymphocytes Relative: 34 %
MCH: 32 pg (ref 26.0–34.0)
MCHC: 33.3 g/dL (ref 32.0–36.0)
MCV: 96.3 fL (ref 80.0–100.0)
MONOS PCT: 7 %
Monocytes Absolute: 0.4 10*3/uL (ref 0.2–1.0)
NEUTROS PCT: 56 %
Neutro Abs: 3.4 10*3/uL (ref 1.4–6.5)
Platelets: 129 10*3/uL — ABNORMAL LOW (ref 150–440)
RBC: 4.7 MIL/uL (ref 4.40–5.90)
RDW: 14.2 % (ref 11.5–14.5)
WBC: 6 10*3/uL (ref 3.8–10.6)

## 2017-11-29 LAB — BASIC METABOLIC PANEL
Anion gap: 10 (ref 5–15)
BUN: 18 mg/dL (ref 6–20)
CO2: 25 mmol/L (ref 22–32)
CREATININE: 1 mg/dL (ref 0.61–1.24)
Calcium: 9.3 mg/dL (ref 8.9–10.3)
Chloride: 104 mmol/L (ref 101–111)
GFR calc non Af Amer: >60 mL/min (ref >60)
Glucose, Bld: 170 mg/dL — ABNORMAL HIGH (ref 65–99)
Potassium: 4.2 mmol/L (ref 3.5–5.1)
Sodium: 139 mmol/L (ref 135–145)

## 2017-12-01 ENCOUNTER — Encounter: Payer: Self-pay | Admitting: Emergency Medicine

## 2017-12-02 ENCOUNTER — Encounter
Admission: RE | Disposition: A | Discharge status: Home / Self Care | Payer: Self-pay | Source: Ambulatory Visit | Attending: Gastroenterology

## 2017-12-02 ENCOUNTER — Encounter: Payer: Self-pay | Admitting: Gastroenterology

## 2017-12-02 ENCOUNTER — Ambulatory Visit
Admission: RE | Admit: 2017-12-02 | Discharge: 2017-12-02 | Disposition: A | Discharge status: Home / Self Care | Payer: Medicare Other | Source: Ambulatory Visit | Attending: Gastroenterology | Admitting: Gastroenterology

## 2017-12-02 ENCOUNTER — Ambulatory Visit: Payer: Medicare Other | Admitting: Anesthesiology

## 2017-12-02 DIAGNOSIS — Z1211 Encounter for screening for malignant neoplasm of colon: Secondary | ICD-10-CM

## 2017-12-02 DIAGNOSIS — M19011 Primary osteoarthritis, right shoulder: Secondary | ICD-10-CM | POA: Diagnosis not present

## 2017-12-02 DIAGNOSIS — D123 Benign neoplasm of transverse colon: Secondary | ICD-10-CM | POA: Diagnosis not present

## 2017-12-02 DIAGNOSIS — K5730 Diverticulosis of large intestine without perforation or abscess without bleeding: Secondary | ICD-10-CM | POA: Diagnosis not present

## 2017-12-02 DIAGNOSIS — I255 Ischemic cardiomyopathy: Secondary | ICD-10-CM | POA: Insufficient documentation

## 2017-12-02 DIAGNOSIS — M1712 Unilateral primary osteoarthritis, left knee: Secondary | ICD-10-CM | POA: Insufficient documentation

## 2017-12-02 DIAGNOSIS — D120 Benign neoplasm of cecum: Secondary | ICD-10-CM

## 2017-12-02 DIAGNOSIS — K219 Gastro-esophageal reflux disease without esophagitis: Secondary | ICD-10-CM | POA: Insufficient documentation

## 2017-12-02 DIAGNOSIS — Z87891 Personal history of nicotine dependence: Secondary | ICD-10-CM | POA: Insufficient documentation

## 2017-12-02 DIAGNOSIS — Z923 Personal history of irradiation: Secondary | ICD-10-CM | POA: Diagnosis not present

## 2017-12-02 DIAGNOSIS — I4891 Unspecified atrial fibrillation: Secondary | ICD-10-CM | POA: Diagnosis not present

## 2017-12-02 DIAGNOSIS — R195 Other fecal abnormalities: Secondary | ICD-10-CM | POA: Diagnosis not present

## 2017-12-02 DIAGNOSIS — I10 Essential (primary) hypertension: Secondary | ICD-10-CM | POA: Insufficient documentation

## 2017-12-02 DIAGNOSIS — K635 Polyp of colon: Secondary | ICD-10-CM

## 2017-12-02 DIAGNOSIS — E119 Type 2 diabetes mellitus without complications: Secondary | ICD-10-CM | POA: Insufficient documentation

## 2017-12-02 DIAGNOSIS — Z7984 Long term (current) use of oral hypoglycemic drugs: Secondary | ICD-10-CM | POA: Diagnosis not present

## 2017-12-02 DIAGNOSIS — Z8249 Family history of ischemic heart disease and other diseases of the circulatory system: Secondary | ICD-10-CM | POA: Insufficient documentation

## 2017-12-02 DIAGNOSIS — Z8521 Personal history of malignant neoplasm of larynx: Secondary | ICD-10-CM | POA: Diagnosis not present

## 2017-12-02 DIAGNOSIS — E785 Hyperlipidemia, unspecified: Secondary | ICD-10-CM | POA: Diagnosis not present

## 2017-12-02 DIAGNOSIS — E559 Vitamin D deficiency, unspecified: Secondary | ICD-10-CM | POA: Diagnosis not present

## 2017-12-02 DIAGNOSIS — D125 Benign neoplasm of sigmoid colon: Secondary | ICD-10-CM

## 2017-12-02 DIAGNOSIS — Z85828 Personal history of other malignant neoplasm of skin: Secondary | ICD-10-CM | POA: Diagnosis not present

## 2017-12-02 DIAGNOSIS — Z79899 Other long term (current) drug therapy: Secondary | ICD-10-CM | POA: Diagnosis not present

## 2017-12-02 DIAGNOSIS — Z951 Presence of aortocoronary bypass graft: Secondary | ICD-10-CM | POA: Insufficient documentation

## 2017-12-02 DIAGNOSIS — Z7982 Long term (current) use of aspirin: Secondary | ICD-10-CM | POA: Insufficient documentation

## 2017-12-02 DIAGNOSIS — I2510 Atherosclerotic heart disease of native coronary artery without angina pectoris: Secondary | ICD-10-CM | POA: Insufficient documentation

## 2017-12-02 HISTORY — PX: COLONOSCOPY WITH PROPOFOL: SHX5780

## 2017-12-02 LAB — GLUCOSE, CAPILLARY: Glucose-Capillary: 132 mg/dL — ABNORMAL HIGH (ref 65–99)

## 2017-12-02 SURGERY — COLONOSCOPY WITH PROPOFOL
Anesthesia: General

## 2017-12-02 MED ORDER — LIDOCAINE 2% (20 MG/ML) 5 ML SYRINGE
INTRAMUSCULAR | Status: DC | PRN
  Administered 2017-12-02: 40 mg via INTRAVENOUS

## 2017-12-02 MED ORDER — LIDOCAINE HCL (PF) 2 % IJ SOLN
INTRAMUSCULAR | Status: AC
  Filled 2017-12-02: qty 10

## 2017-12-02 MED ORDER — PHENYLEPHRINE HCL 10 MG/ML IJ SOLN
INTRAMUSCULAR | Status: AC
  Filled 2017-12-02: qty 1

## 2017-12-02 MED ORDER — GLYCOPYRROLATE 0.2 MG/ML IJ SOLN
INTRAMUSCULAR | Status: DC | PRN
  Administered 2017-12-02: 0.2 mg via INTRAVENOUS

## 2017-12-02 MED ORDER — PROPOFOL 500 MG/50ML IV EMUL
INTRAVENOUS | Status: DC | PRN
  Administered 2017-12-02: 140 ug/kg/min via INTRAVENOUS

## 2017-12-02 MED ORDER — SODIUM CHLORIDE 0.9 % IV SOLN
INTRAVENOUS | Status: DC
  Administered 2017-12-02: 07:00:00 via INTRAVENOUS

## 2017-12-02 MED ORDER — PROPOFOL 10 MG/ML IV BOLUS
INTRAVENOUS | Status: DC | PRN
  Administered 2017-12-02: 100 mg via INTRAVENOUS

## 2017-12-02 MED ORDER — FENTANYL CITRATE (PF) 100 MCG/2ML IJ SOLN
INTRAMUSCULAR | Status: DC | PRN
  Administered 2017-12-02 (×2): 50 ug via INTRAVENOUS

## 2017-12-02 MED ORDER — SODIUM CHLORIDE 0.9 % IV SOLN
INTRAVENOUS | Status: DC
  Administered 2017-12-02: 1000 mL via INTRAVENOUS

## 2017-12-02 MED ORDER — FENTANYL CITRATE (PF) 100 MCG/2ML IJ SOLN
INTRAMUSCULAR | Status: AC
  Filled 2017-12-02: qty 2

## 2017-12-02 MED ORDER — SODIUM CHLORIDE 0.9 % IJ SOLN
INTRAMUSCULAR | Status: AC
  Filled 2017-12-02: qty 10

## 2017-12-02 MED ORDER — LIDOCAINE HCL (PF) 1 % IJ SOLN
2.0000 mL | Freq: Once | INTRAMUSCULAR | Status: AC
  Administered 2017-12-02: 0.3 mL via INTRADERMAL

## 2017-12-02 MED ORDER — PROPOFOL 500 MG/50ML IV EMUL
INTRAVENOUS | Status: AC
  Filled 2017-12-02: qty 50

## 2017-12-02 MED ORDER — PROPOFOL 10 MG/ML IV BOLUS
INTRAVENOUS | Status: AC
  Filled 2017-12-02: qty 40

## 2017-12-02 MED ORDER — EPHEDRINE SULFATE 50 MG/ML IJ SOLN
INTRAMUSCULAR | Status: DC | PRN
  Administered 2017-12-02: 5 mg via INTRAVENOUS
  Administered 2017-12-02: 10 mg via INTRAVENOUS

## 2017-12-02 MED ORDER — LIDOCAINE HCL (PF) 1 % IJ SOLN
INTRAMUSCULAR | Status: AC
  Administered 2017-12-02: 0.3 mL via INTRADERMAL
  Filled 2017-12-02: qty 2

## 2017-12-02 MED ORDER — PROPOFOL 10 MG/ML IV BOLUS
INTRAVENOUS | Status: AC
Start: 2017-12-02 — End: 2017-12-02
  Filled 2017-12-02: qty 20

## 2017-12-02 NOTE — Anesthesia Post-op Follow-up Note (Signed)
Anesthesia QCDR form completed.        

## 2017-12-02 NOTE — H&P (Addendum)
[name], MD [address], [place], [place], [place], [address] [address], [place], [place], [place], [address] Phone: [phone]  Fax: [phone]  Primary Care Physician:  Ria Bush, MD   Pre-Procedure History & Physical: HPI:  Shannon Chung is a 77 y.o. male is here for a colonoscopy.   Past Medical History:  Diagnosis Date  . Coronary artery disease    a. 06/2015 Cardiac CT: Ca score 1103 (84th %'ile);  b. 07/2015 Cath: LM 70, LAD 80p, 100/30m D1 70, D2 95, RI 75, RCA 100p/m;  c. 07/2015 CABG x 5 (LIMA->LAD, VG->Diag, VG->OM1->OM2, VG->OM3).  . Diabetes mellitus without complication ([place] 93/8453 . Dyslipidemia   . Essential hypertension   . 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 Fuchs' corneal dystrophy 2016   sees Dr KMaudie Mercury . GERD (gastroesophageal reflux disease)   . History of radiation exposure    right vocal cord squamous cell cancer  . Impingement syndrome of right shoulder 10/2015   s/p steroid injection Dr MSabra Heck . Ischemic cardiomyopathy    a. dilated, EF 35% improved to 45-50% (2015);  b. 07/2015 EF 25-35% by LV gram.  . 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 isolated episode, not on [place]  .[place] Kitchen[place] thrombus of cardiac apex    a. 06/2014: LV; resolved with coumadin-->no residual on f/u echo, no longer on coumadin.  . Osteoarthritis    a. R-shoulder, L-knee (Sabra Heckortho)  . Skin cancer    squamous and basal, sees derm regularly  . Squamous cell carcinoma of vocal cord ([place] 2008   XRT  . Vitamin D deficiency     Past Surgical History:  Procedure Laterality Date  . BICEPS TENDON REPAIR Right 1993  . CARDIAC CATHETERIZATION N/A 07/05/2015   Procedure: Left Heart Cath and Coronary Angiography;  Surgeon: TMinna Merritts MD;  [facility];  Service: Cardiovascular;  Laterality: N/A;  . CATARACT EXTRACTION Left 12/2016   with keratoplasty  . COLONOSCOPY  2007  . CORONARY ARTERY BYPASS  GRAFT N/A 07/29/2015   Procedure: CORONARY ARTERY BYPASS GRAFTING (CABG) x 5 (LIMA to LAD, SVG to DIAGONAL, SVG SEQUENTIALLY to OM1 and OM2, SVG to OM3) with Endoscopic Vein Havesting of GREATER SAPHENOUS VEIN from RIGHT THIGH and partial LOWER LEG ;  Surgeon: BGaye Pollack MD;  [facility];  Service: Open Heart Surgery;  Laterality: N/A;  . JOINT REPLACEMENT    . KNEE ARTHROSCOPY Left remote  . SKIN CANCER EXCISION  10/2015   BCC - L ala (pending MOHs) and L scapula (complete excision)  . TEE WITHOUT CARDIOVERSION N/A 07/29/2015   Procedure: TRANSESOPHAGEAL ECHOCARDIOGRAM (TEE);  Surgeon: BGaye Pollack MD;  [facility]  Service: Open Heart Surgery;  Laterality: N/A;  . TONSILLECTOMY  1949  . TOTAL KNEE ARTHROPLASTY Left 03/18/2016   cemented L TKR; HEarnestine Leys MD    Prior to Admission medications   Medication Sig Start Date End Date Taking? Authorizing Provider  acetaminophen (TYLENOL) 500 MG tablet Take 500 mg by mouth every 6 (six) hours as needed for mild pain.     [provider]  aspirin (ASPIRIN EC) 81 MG EC tablet Take 162 mg by mouth daily. Swallow whole.     [provider]  atorvastatin (LIPITOR) 40 MG tablet TAKE 1 TABLET (40 MG TOTAL) BY MOUTH DAILY. 02/25/17   GMinna Merritts MD  blood glucose meter kit  and supplies KIT Dispense based on patient and insurance preference (one touch ultra mini silver). Use once daily and as needed E11.9 10/04/17   Ria Bush, MD  Cholecalciferol (VITAMIN D) 2000 units CAPS Take 1 capsule (2,000 Units total) daily by mouth. 09/18/17   Ria Bush, MD  fluticasone [facility]) 50 MCG/ACT nasal spray Place 1 spray into both nostrils as needed for allergies. Reported on 03/18/2016    [provider]  glucose blood test strip Use to check once daily and as needed (onetouch) E11.9 10/04/17   Ria Bush, MD  Lancets [facility] ULTRASOFT) lancets Use to check once daily and as needed E11.9 10/04/17    Ria Bush, MD  lansoprazole (PREVACID) 30 MG capsule Take 1 capsule (30 mg total) by mouth every morning. 08/12/15   Ria Bush, MD  lisinopril (PRINIVIL,ZESTRIL) 2.5 MG tablet TAKE 1 TABLET (2.5 MG TOTAL) BY MOUTH DAILY. 01/05/17   Minna Merritts, MD  metFORMIN (GLUCOPHAGE) 500 MG tablet Take 1 tablet (500 mg total) daily by mouth. 09/18/17   Ria Bush, MD  metoprolol succinate (TOPROL-XL) 25 MG 24 hr tablet TAKE 1 TABLET (25 MG TOTAL) BY MOUTH DAILY. 01/05/17   Minna Merritts, MD  prednisoLONE acetate (PRED FORTE) 1 % ophthalmic suspension INSTILL 1 DROP IN SURGICAL EYE 4 TIMES A DAY 09/19/17   [provider]  ranitidine (ZANTAC) 150 MG tablet TAKE 1 TABLET (150 MG TOTAL) BY MOUTH AT BEDTIME. 08/03/17   Ria Bush, MD  sildenafil (REVATIO) 20 MG tablet Take 2-4 tablets (40-80 mg total) by mouth daily as needed (relations). Patient not taking: Reported on 10/22/2017 02/23/17   Ria Bush, MD  vitamin B-12 (CYANOCOBALAMIN) 500 MCG tablet Take 500 mcg by mouth daily. Reported on 03/17/2016    [provider]    Allergies as of 11/01/2017  . (No Known Allergies)    Family History  Problem Relation Age of Onset  . CAD Father 57       MI  . Hypertension Father   . Hyperlipidemia Father   . Alcoholism Father   . Diabetes Father     Social History   Socioeconomic History  . Marital status: Divorced    Spouse name: Not on file  . Number of children: Not on file  . Years of education: Not on file  . Highest education level: Not on file  Social Needs  . Financial resource strain: Not on file  . Food insecurity - worry: Not on file  . Food insecurity - inability: Not on file  . Transportation needs - medical: Not on file  . Transportation needs - non-medical: Not on file  Occupational History  . Not on file  Tobacco Use  . Smoking status: Former Smoker    Packs/day: 0.50    Years: 10.00    Pack years: 5.00    Types:  Cigarettes    Last attempt to quit: 11/02/1978    Years since quitting: 39.1  . Smokeless tobacco: Never Used  Substance and Sexual Activity  . Alcohol use: Yes    Alcohol/week: 0.0 oz    Comment: beer/wine on weekends  . Drug use: No  . Sexual activity: Yes  Other Topics Concern  . Not on file  Social History Narrative   Lives with fiancee for 16yr ([place]ta Clines   Divorced   Occupation Retired ANurse, mental health  Edu: 1 yr college   Activity: volunteers at [employer], fruits/vegetables daily  Tested at risk for OSA in preop for CABG    Review of Systems: See HPI, otherwise negative ROS  Physical Exam: BP (!) 141/96   Pulse 70   Temp 97.7 F (36.5 C)   Resp 17   Ht 6' (1.829 m)   Wt 225 lb (102.1 kg)   SpO2 96%   BMI 30.52 kg/m  General:   Alert,  pleasant and cooperative in NAD Head:  Normocephalic and atraumatic. Neck:  Supple; no masses or thyromegaly. Lungs:  Clear throughout to auscultation, normal respiratory effort.    Heart:  +S1, +S2, Regular rate and rhythm, No edema. Abdomen:  Soft, nontender and nondistended. Normal bowel sounds, without guarding, and without rebound.   Neurologic:  Alert and  oriented x4;  grossly normal neurologically.  Impression/Plan: Shannon Chung is here for an colonoscopy to be performed for positive Cologuard test   Risks, benefits, limitations, and alternatives regarding  colonoscopy have been reviewed with the patient.  Questions have been answered.  All parties agreeable.   Virgel Manifold, MD  12/02/2017, 7:36 AM

## 2017-12-02 NOTE — Transfer of Care (Signed)
Immediate Anesthesia Transfer of Care Note  Patient: Shannon Chung  Procedure(s) Performed: COLONOSCOPY WITH PROPOFOL (N/A )  Patient Location: PACU and Endoscopy Unit  Anesthesia Type:General  Level of Consciousness: sedated  Airway & Oxygen Therapy: Patient Spontanous Breathing and Patient connected to nasal cannula oxygen  Post-op Assessment: Report given to RN and Post -op Vital signs reviewed and stable  Post vital signs: Reviewed and stable  Last Vitals:  Vitals:   12/02/17 0703  BP: (!) 141/96  Pulse: 70  Resp: 17  Temp: 36.5 C  SpO2: 96%    Last Pain: There were no vitals filed for this visit.       Complications: No apparent anesthesia complications

## 2017-12-02 NOTE — Op Note (Signed)
[facility] Gastroenterology Patient Name: Shannon Chung Procedure Date: 12/02/2017 7:24 AM MRN: [id number] Account #: [id number] Date of Birth: [birth year] Admit Type: Outpatient Age: 77 Room: [facility] ENDO ROOM 3 Gender: Male Note Status: Finalized Procedure:            Colonoscopy Indications:          Positive Cologuard test Providers:            Yannely Kintzel B. Bonna Gains MD, MD Referring MD:         Ria Bush (Referring MD) Medicines:            Monitored Anesthesia Care Complications:        No immediate complications. Procedure:            Pre-Anesthesia Assessment:                       - ASA Grade Assessment: III - A patient with severe                        systemic disease.                       - Prior to the procedure, a History and Physical was                        performed, and patient medications, allergies and                        sensitivities were reviewed. The patient's tolerance of                        previous anesthesia was reviewed.                       - The risks and benefits of the procedure and the                        sedation options and risks were discussed with the                        patient. All questions were answered and informed                        consent was obtained.                       - Patient identification and proposed procedure were                        verified prior to the procedure by the physician, the                        nurse, the anesthesiologist, the anesthetist and the                        technician. The procedure was verified in the procedure                        room.                       After obtaining informed  consent, the colonoscope was                        passed under direct vision. Throughout the procedure,                        the patient's blood pressure, pulse, and oxygen                        saturations were monitored continuously. The   Colonoscope was introduced through the anus and                        advanced to the the cecum, identified by appendiceal                        orifice and ileocecal valve. The colonoscopy was                        performed with ease. The patient tolerated the                        procedure well. The quality of the bowel preparation                        was good except the ascending colon was fair. Findings:      The perianal and digital rectal examinations were normal.      Four sessile polyps were found in the sigmoid colon, transverse colon       and cecum. The polyps were 2 to 5 mm in size. These polyps were removed       with a cold snare. Resection and retrieval were complete.      Multiple small-mouthed diverticula were found in the sigmoid colon,       descending colon and ascending colon.      The exam was otherwise without abnormality.      The rectum, sigmoid colon, descending colon, transverse colon, ascending       colon and cecum appeared normal.      The retroflexed view of the distal rectum and anal verge was normal and       showed no anal or rectal abnormalities except hyperplastic mucosa at the       anal verge. Impression:           - Four 2 to 5 mm polyps in the sigmoid colon, in the                        transverse colon and in the cecum, removed with a cold                        snare. Resected and retrieved.                       - Diverticulosis in the sigmoid colon, in the                        descending colon and in the ascending colon.                       - The examination was otherwise normal.                       -  The rectum, sigmoid colon, descending colon,                        transverse colon, ascending colon and cecum are normal.                       - The distal rectum and anal verge are normal on                        retroflexion view. Recommendation:       - Discharge patient to home (with escort).                       -  Advance diet as tolerated.                       - Continue present medications.                       - Await pathology results.                       - Repeat colonoscopy in 3 - 5 years for surveillance                        based on pathology results.                       - The findings and recommendations were discussed with                        the patient.                       - The findings and recommendations were discussed with                        the patient's family.                       - Return to primary care physician as previously                        scheduled.                       - High fiber diet. Procedure Code(s):    --- Professional ---                       [phone], Colonoscopy, flexible; with removal of tumor(s),                        polyp(s), or other lesion(s) by snare technique Diagnosis Code(s):    --- Professional ---                       D12.5, Benign neoplasm of sigmoid colon                       D12.3, Benign neoplasm of transverse colon (hepatic                        flexure or splenic flexure)  D12.0, Benign neoplasm of cecum                       R19.5, Other fecal abnormalities                       K57.30, Diverticulosis of large intestine without                        perforation or abscess without bleeding CPT copyright 2016 American Medical Association. All rights reserved. The codes documented in this report are preliminary and upon coder review may  be revised to meet current compliance requirements.  [name], MD Margretta Sidle B. Bonna Gains MD, MD 12/02/2017 8:38:11 AM This report has been signed electronically. Number of Addenda: 0 Note Initiated On: 12/02/2017 7:24 AM Scope Withdrawal Time: 0 hours 37 minutes 41 seconds  Total Procedure Duration: 0 hours 45 minutes 43 seconds  Estimated Blood Loss: Estimated blood loss: none.      [facility]

## 2017-12-02 NOTE — Anesthesia Postprocedure Evaluation (Signed)
Anesthesia Post Note  Patient: Shannon Chung  Procedure(s) Performed: COLONOSCOPY WITH PROPOFOL (N/A )  Patient location during evaluation: Endoscopy Anesthesia Type: General Level of consciousness: awake and alert Pain management: pain level controlled Vital Signs Assessment: post-procedure vital signs reviewed and stable Respiratory status: spontaneous breathing, nonlabored ventilation, respiratory function stable and patient connected to nasal cannula oxygen Cardiovascular status: blood pressure returned to baseline and stable Postop Assessment: no apparent nausea or vomiting Anesthetic complications: no     Last Vitals:  Vitals:   12/02/17 0900 12/02/17 0901  BP: (!) 129/111 (!) 143/96  Pulse: 66 69  Resp: 14 18  Temp:    SpO2: 98% 98%    Last Pain:  Vitals:   12/02/17 0900  PainSc: 0-No pain                 Martha Clan

## 2017-12-02 NOTE — Anesthesia Preprocedure Evaluation (Signed)
Anesthesia Evaluation  Patient identified by MRN, date of birth, ID band Patient awake    Reviewed: Allergy & Precautions, H&P , NPO status , Patient's Chart, lab work & pertinent test results  History of Anesthesia Complications Negative for: history of anesthetic complications  Airway Mallampati: III  TM Distance: >3 FB Neck ROM: limited    Dental  (+) Poor Dentition, Chipped, Caps, Loose   Pulmonary neg shortness of breath, former smoker,    Pulmonary exam normal breath sounds clear to auscultation       Cardiovascular Exercise Tolerance: Good hypertension, (-) angina+ CAD, + Past MI and + CABG  (-) DOE Normal cardiovascular exam Rhythm:regular Rate:Normal     Neuro/Psych negative neurological ROS  negative psych ROS   GI/Hepatic Neg liver ROS, GERD  Controlled,  Endo/Other  diabetes, Type 2  Renal/GU negative Renal ROS  negative genitourinary   Musculoskeletal  (+) Arthritis ,   Abdominal   Peds  Hematology negative hematology ROS (+)   Anesthesia Other Findings Past Medical History:   Skin cancer                                                    Comment:squamous and basal, sees derm regularly   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 06/2014: LV; resolved with coumadin-->no               residual on f/u echo, no longer on coumadin.   Ischemic cardiomyopathy                                        Comment:a. dilated, EF 35% improved to 45-50% (2015);                b. 07/2015 EF 25-35% by LV gram.   History of radiation exposure                                  Comment:right vocal cord squamous cell cancer   Essential hypertension                                       Dyslipidemia                                                 [place] atrial fibrillation ([place])                  1983        Comment:a. isolated episode, not on [place].   Vitamin D deficiency  Coronary artery disease                                        Comment:a. 06/2015 Cardiac CT: Ca score 1103 (84th               %'ile);  b. 07/2015 Cath: LM 70, LAD 80p,               100/3m, D1 70, D2 95, RI 75, RCA 100p/m;  c.               07/2015 CABG x 5 (LIMA->LAD, VG->Diag,               VG->OM1->OM2, VG->OM3).   Osteoarthritis                                                 Comment:a. R-shoulder, L-knee [facility])   Diabetes mellitus without complication ([place])    03/3663       Impingement syndrome of right shoulder          10/2015        Comment:s/p steroid injection Dr Sabra Heck   Facial basal cell cancer                        10/2015        Comment:L ala, pending MOHs ([place])   GERD (gastroesophageal reflux disease)                      Past Surgical History:   TONSILLECTOMY                                    1949         BICEPS TENDON REPAIR                            Right 1993         KNEE ARTHROSCOPY                                Left remote       CARDIAC CATHETERIZATION                         N/A 07/05/2015       Comment:Procedure: Left Heart Cath and Coronary               Angiography;  Surgeon: Minna Merritts, MD;                Location: [facility];  Service:               Cardiovascular;  Laterality: N/A;   COLONOSCOPY                                      2007         CORONARY ARTERY BYPASS GRAFT  N/A 07/29/2015      Comment:Procedure: CORONARY ARTERY BYPASS GRAFTING               (CABG) x 5 (LIMA to LAD, SVG to DIAGONAL, SVG               SEQUENTIALLY to OM1 and OM2, SVG to OM3) with               Endoscopic Vein [place] of GREATER SAPHENOUS              VEIN from RIGHT THIGH and partial LOWER LEG ;                Surgeon: [name], MD;  Location: [facility];               Service: Open Heart Surgery;  Laterality: N/A;    TEE WITHOUT CARDIOVERSION                       N/A 07/29/2015      Comment:Procedure: TRANSESOPHAGEAL ECHOCARDIOGRAM               (TEE);  Surgeon: [name], MD;  Location:              [facility];  Service: Open Heart Surgery;                Laterality: N/A;   SKIN CANCER EXCISION                             10/2015        Comment:BCC - L ala (pending MOHs) and L scapula               (complete excision)   Patient has cardiac clearance for this procedure.    Reproductive/Obstetrics negative OB ROS                             Anesthesia Physical  Anesthesia Plan  ASA: III  Anesthesia Plan: General   Post-op Pain Management:    Induction: Intravenous  PONV Risk Score and Plan: 2 and Propofol infusion  Airway Management Planned: Nasal Cannula  Additional Equipment:   Intra-op Plan:   Post-operative Plan:   Informed Consent: I have reviewed the patients History and Physical, chart, labs and discussed the procedure including the risks, benefits and alternatives for the proposed anesthesia with the patient or authorized representative who has indicated his/her understanding and acceptance.   Dental Advisory Given  Plan Discussed with: Anesthesiologist, CRNA and Surgeon  Anesthesia Plan Comments:         Anesthesia Quick Evaluation

## 2017-12-03 ENCOUNTER — Other Ambulatory Visit: Payer: Self-pay | Admitting: Gastroenterology

## 2017-12-03 DIAGNOSIS — R748 Abnormal levels of other serum enzymes: Secondary | ICD-10-CM

## 2017-12-03 DIAGNOSIS — D696 Thrombocytopenia, unspecified: Secondary | ICD-10-CM

## 2017-12-03 DIAGNOSIS — R195 Other fecal abnormalities: Secondary | ICD-10-CM

## 2017-12-03 LAB — SURGICAL PATHOLOGY

## 2017-12-03 NOTE — Progress Notes (Signed)
hepatc

## 2017-12-04 ENCOUNTER — Encounter: Payer: Self-pay | Admitting: Family Medicine

## 2017-12-06 ENCOUNTER — Encounter: Payer: Self-pay | Admitting: Gastroenterology

## 2017-12-07 ENCOUNTER — Telehealth: Payer: Self-pay | Admitting: Gastroenterology

## 2017-12-07 NOTE — Telephone Encounter (Signed)
LVM for patient to call and reschedule. °

## 2017-12-08 ENCOUNTER — Other Ambulatory Visit: Payer: Self-pay

## 2017-12-08 DIAGNOSIS — R899 Unspecified abnormal finding in specimens from other organs, systems and tissues: Secondary | ICD-10-CM

## 2017-12-15 ENCOUNTER — Encounter: Payer: Self-pay | Admitting: Family Medicine

## 2017-12-16 ENCOUNTER — Other Ambulatory Visit: Payer: Self-pay | Admitting: Cardiovascular Disease

## 2018-01-19 ENCOUNTER — Ambulatory Visit: Payer: Medicare Other | Admitting: Gastroenterology

## 2018-01-19 ENCOUNTER — Other Ambulatory Visit: Payer: Self-pay

## 2018-01-19 ENCOUNTER — Encounter: Payer: Self-pay | Admitting: Gastroenterology

## 2018-01-19 VITALS — BP 128/73 | HR 65 | Temp 98.1°F | Ht 72.0 in | Wt 237.0 lb

## 2018-01-19 DIAGNOSIS — D696 Thrombocytopenia, unspecified: Secondary | ICD-10-CM | POA: Diagnosis not present

## 2018-01-19 DIAGNOSIS — Z86010 Personal history of colonic polyps: Secondary | ICD-10-CM | POA: Diagnosis not present

## 2018-01-19 DIAGNOSIS — K7460 Unspecified cirrhosis of liver: Secondary | ICD-10-CM | POA: Diagnosis not present

## 2018-01-19 NOTE — Progress Notes (Signed)
Shannon Antigua, MD [address]  [place]  [place], [place] [address]  Main: [phone]  Fax: [phone]   Primary Care Physician: Ria Bush, MD  Primary Gastroenterologist:  Dr. Vonda Chung  Chief Complaint  Patient presents with  . Follow-up    colonoscopy results    HPI: Shannon Chung is a 77 y.o. male here for follow-up after colonoscopy.  Patient underwent colonoscopy on December 02, 2017 due to positive Colo guard.  4, 2-5 mm polyps in the sigmoid colon, transverse colon, and cecum were seen and removed with cold snare.  Diverticulosis was seen.  Tubular adenoma and sessile serrated adenoma were seen and repeat recommended in 3 years.  In addition, his blood work revealed, his platelets to be mildly low at 129 in January 2019.  They have been low previously as well around 100-106.  Patient denies previously knowing that he has thrombocytopenia.  Denies any previous history of cirrhosis.  Denies any previous history of heavy alcohol intake or daily alcohol use.  Denies any herbal supplements or hepatotoxic drug use.  Current Outpatient Medications  Medication Sig Dispense Refill  . acetaminophen (TYLENOL) 500 MG tablet Take 500 mg by mouth every 6 (six) hours as needed for mild pain.     Marland Kitchen aspirin (ASPIRIN EC) 81 MG EC tablet Take 162 mg by mouth daily. Swallow whole.     Marland Kitchen atorvastatin (LIPITOR) 40 MG tablet TAKE 1 TABLET (40 MG TOTAL) BY MOUTH DAILY. 90 tablet 3  . blood glucose meter kit and supplies KIT Dispense based on patient and insurance preference (one touch ultra mini silver). Use once daily and as needed E11.9 1 each 0  . Cholecalciferol (VITAMIN D) 2000 units CAPS Take 1 capsule (2,000 Units total) daily by mouth. 30 capsule   . fluticasone (FLONASE) 50 MCG/ACT nasal spray Place 1 spray into both nostrils as needed for allergies. Reported on 03/18/2016    . glucose blood test strip Use to check once daily and as needed (onetouch) E11.9  100 each 3  . Lancets (ONETOUCH ULTRASOFT) lancets Use to check once daily and as needed E11.9 100 each 3  . lansoprazole (PREVACID) 30 MG capsule Take 1 capsule (30 mg total) by mouth every morning. 90 capsule 3  . lisinopril (PRINIVIL,ZESTRIL) 2.5 MG tablet TAKE 1 TABLET (2.5 MG TOTAL) BY MOUTH DAILY. 90 tablet 3  . metFORMIN (GLUCOPHAGE) 500 MG tablet Take 1 tablet (500 mg total) daily by mouth. 30 tablet 6  . metoprolol succinate (TOPROL-XL) 25 MG 24 hr tablet TAKE 1 TABLET (25 MG TOTAL) BY MOUTH DAILY. 90 tablet 3  . prednisoLONE acetate (PRED FORTE) 1 % ophthalmic suspension INSTILL 1 DROP IN SURGICAL EYE 4 TIMES A DAY  3  . ranitidine (ZANTAC) 150 MG tablet TAKE 1 TABLET (150 MG TOTAL) BY MOUTH AT BEDTIME. 180 tablet 0  . sildenafil (REVATIO) 20 MG tablet Take 2-4 tablets (40-80 mg total) by mouth daily as needed (relations). 30 tablet 3  . vitamin B-12 (CYANOCOBALAMIN) 500 MCG tablet Take 500 mcg by mouth daily. Reported on 03/17/2016     No current facility-administered medications for this visit.     Allergies as of 01/19/2018  . (No Known Allergies)    ROS:  General: Negative for anorexia, weight loss, fever, chills, fatigue, weakness. ENT: Negative for hoarseness, difficulty swallowing , nasal congestion. CV: Negative for chest pain, angina, palpitations, dyspnea on exertion, peripheral edema.  Respiratory: Negative for dyspnea at rest, dyspnea on exertion,  cough, sputum, wheezing.  GI: See history of present illness. GU:  Negative for dysuria, hematuria, urinary incontinence, urinary frequency, nocturnal urination.  Endo: Negative for unusual weight change.    Physical Examination:   BP 128/73   Pulse 65   Temp 98.1 F (36.7 C) (Oral)   Ht 6' (1.829 m)   Wt 237 lb (107.5 kg)   BMI 32.14 kg/m   General: Well-nourished, well-developed in no acute distress.  Eyes: No icterus. Conjunctivae pink. Mouth: Oropharyngeal mucosa moist and pink , no lesions erythema or  exudate. Neck: Supple, Trachea midline Abdomen: Bowel sounds are normal, nontender, nondistended, no hepatosplenomegaly or masses, no abdominal bruits or hernia , no rebound or guarding.   Extremities: No lower extremity edema. No clubbing or deformities. Neuro: Alert and oriented x 3.  Grossly intact. Skin: Warm and dry, no jaundice.   Psych: Alert and cooperative, normal mood and affect.   Labs: CMP     Component Value Date/Time   NA 139 11/29/2017 1039   NA 141 11/28/2014   NA 141 11/28/2014   K 4.2 11/29/2017 1039   CL 104 11/29/2017 1039   CO2 25 11/29/2017 1039   GLUCOSE 170 (H) 11/29/2017 1039   BUN 18 11/29/2017 1039   BUN 12 11/28/2014   CREATININE 1.00 11/29/2017 1039   CREATININE 0.85 11/28/2014   CREATININE 0.85 11/28/2014   CALCIUM 9.3 11/29/2017 1039   PROT 7.4 01/23/2016 0803   ALBUMIN 4.8 01/23/2016 0803   ALBUMIN 4.0 11/28/2014   AST 30 01/23/2016 0803   AST 21 11/28/2014   AST 21 11/28/2014   ALT 13 01/23/2016 0803   ALT 27 11/28/2014   ALT 27 11/28/2014   ALKPHOS 57 01/23/2016 0803   ALKPHOS 53 11/28/2014   ALKPHOS 53 11/28/2014   BILITOT 0.6 01/23/2016 0803   BILITOT 0.5 11/28/2014   BILITOT 0.5 11/28/2014   GFRNONAA >60 11/29/2017 1039   GFRAA >60 11/29/2017 1039   Lab Results  Component Value Date   WBC 6.0 11/29/2017   HGB 15.1 11/29/2017   HCT 45.3 11/29/2017   MCV 96.3 11/29/2017   PLT 129 (L) 11/29/2017    Imaging Studies: No results found.  Assessment and Plan:   [name] is a 77 y.o. y/o male with positive Colo guard in the past, and status post colonoscopy which revealed 4 polyps that were removed, repeat recommended in 3 years as indicated in HPI, CBC revealing thrombocytopenia  We will initiate workup for cirrhosis which can result in thrombocytopenia We will begin with obtaining CMP, INR, viral hepatitis workup, abdominal ultrasound. If above workup is negative, primary care physician can consider referral to  hematology Patient encouraged to avoid hepatotoxic drugs including alcohol, and over-the-counter supplements, and herbal products. No clinical evidence of cirrhosis present.  Dr Shannon Chung

## 2018-01-26 ENCOUNTER — Other Ambulatory Visit: Payer: Self-pay | Admitting: Cardiovascular Disease

## 2018-01-26 ENCOUNTER — Other Ambulatory Visit: Payer: Self-pay | Admitting: Family Medicine

## 2018-02-04 ENCOUNTER — Other Ambulatory Visit: Payer: Self-pay | Admitting: Cardiovascular Disease

## 2018-02-10 ENCOUNTER — Ambulatory Visit: Payer: Medicare Other | Admitting: Internal Medicine

## 2018-02-10 ENCOUNTER — Encounter: Payer: Self-pay | Admitting: Internal Medicine

## 2018-02-10 VITALS — BP 122/62 | HR 72 | Temp 98.3°F | Ht 72.0 in | Wt 237.2 lb

## 2018-02-10 DIAGNOSIS — R972 Elevated prostate specific antigen [PSA]: Secondary | ICD-10-CM | POA: Diagnosis not present

## 2018-02-10 DIAGNOSIS — Z13220 Encounter for screening for lipoid disorders: Secondary | ICD-10-CM

## 2018-02-10 DIAGNOSIS — D696 Thrombocytopenia, unspecified: Secondary | ICD-10-CM

## 2018-02-10 DIAGNOSIS — K7460 Unspecified cirrhosis of liver: Secondary | ICD-10-CM | POA: Diagnosis not present

## 2018-02-10 DIAGNOSIS — Z125 Encounter for screening for malignant neoplasm of prostate: Secondary | ICD-10-CM

## 2018-02-10 DIAGNOSIS — Z1159 Encounter for screening for other viral diseases: Secondary | ICD-10-CM

## 2018-02-10 DIAGNOSIS — E119 Type 2 diabetes mellitus without complications: Secondary | ICD-10-CM | POA: Diagnosis not present

## 2018-02-10 DIAGNOSIS — R413 Other amnesia: Secondary | ICD-10-CM

## 2018-02-10 DIAGNOSIS — Z13818 Encounter for screening for other digestive system disorders: Secondary | ICD-10-CM | POA: Diagnosis not present

## 2018-02-10 DIAGNOSIS — I499 Cardiac arrhythmia, unspecified: Secondary | ICD-10-CM

## 2018-02-10 DIAGNOSIS — Z1329 Encounter for screening for other suspected endocrine disorder: Secondary | ICD-10-CM | POA: Diagnosis not present

## 2018-02-10 NOTE — Patient Instructions (Addendum)
Please call Dr. Rockey Situ and make and appt your heart beat was irregular today  Please sch fasting labs 02/21/18 and f/u with me late April/early may  For labs please be fasting x 12 hours only water and medication  Thank you   Thrombocytopenia Thrombocytopenia is a condition in which you have an abnormally decreased number of platelets in your blood. Platelets are also called thrombocytes. Platelets are needed for blood clotting. Some cases of thrombocytopenia are mild while others are more severe. What are the causes? This condition may be caused by:  Decreased production of platelets. This can be caused by: ? Aplastic anemia, in which your bone marrow quits making blood cells. ? Cancer in the bone marrow. ? Use of certain medicines, including chemotherapy. ? Infection in the bone marrow. ? Heavy alcohol consumption.  Increased destruction of platelets. This can be caused by: ? Certain immune diseases. ? Use of certain drugs. ? Certain blood clotting disorders. ? Certain inherited disorders. ? Certain bleeding disorders. ? Pregnancy.  Having an enlarged spleen (hypersplenism). In hypersplenism, the spleen gathers up platelets from circulation. This means that the platelets are not available to help with blood clotting. The spleen can be enlarged because of cirrhosis or other conditions.  What are the signs or symptoms? Symptoms of this condition are side effects of poor blood clotting. They will vary depending on how low the platelet counts are. Symptoms may include:  Abnormal bleeding.  Nosebleeds.  Heavy menstrual periods.  Blood in the urine or stool (feces).  A purplish discoloration in the skin (purpura).  Bruising.  A rash that looks like pinpoint, purplish-red spots (petechiae) on the skin and mucous membranes.  How is this diagnosed? This condition may be diagnosed with blood tests and a physical exam. Sometimes, a sample of bone marrow may be removed to look for  the original cells (megakaryocytes) that make platelets. How is this treated? Treatment for this condition depends on the cause. Treatment options may include:  Treatment of another condition that is causing the low platelet count.  Medicines to help protect your platelets from being destroyed.  A replacement (transfusion) of platelets to stop or prevent bleeding.  Surgery to remove the spleen.  Follow these instructions at home: General instructions  Check your skin and the linings inside your mouth for bruising or bleeding as told by your health care provider.  Check your sputum, urine, and stool for blood as told by your health care provider.  Ask your health care provider if it is okay for you to drink alcohol.  Take over-the-counter and prescription medicines only as told by your health care provider.  Tell all of your health care providers, including dentists and eye doctors, about your condition. Activity  Until your health care provider says it is okay. ? Do not return to any activities that could cause bumps or bruises.  Take extra care not to cut yourself when you shave or when you use scissors, needles, knives, and other tools.  Take extra care not to burn yourself when ironing or cooking. Contact a health care provider if:  You have unexplained bruising. Get help right away if:  You have active bleeding from anywhere on your body.  You have blood in your sputum, urine, or stool. This information is not intended to replace advice given to you by your health care provider. Make sure you discuss any questions you have with your health care provider. Document Released: 10/19/2005 Document Revised: 06/21/2016 Document Reviewed:  04/22/2015 Elsevier Interactive Patient Education  [employer].

## 2018-02-10 NOTE — Progress Notes (Signed)
Pre visit review using our clinic review tool, if applicable. No additional management support is needed unless otherwise documented below in the visit note. 

## 2018-02-11 ENCOUNTER — Encounter (INDEPENDENT_AMBULATORY_CARE_PROVIDER_SITE_OTHER): Payer: Self-pay

## 2018-02-12 NOTE — Progress Notes (Signed)
Cardiology Office Note  Date:  02/14/2018   ID:  Shannon Chung, DOB Dec 31, 1940, MRN [id number]  PCP:  [name], Shannon Glow, Chung   Chief Complaint  Patient presents with  . Other    Patient states PCP sent him over for skipping Heartbeat. Patient denies chest pain and SOB. Meds reviewed verballly with patient.     HPI:  Shannon Chung is a 77 year old male with  coronary artery disease,   severe multivessel disease, with CABG at [facility] August 2016,  ischemic cardiomyopathy,  initial ejection fraction in July 2015 of 35%, up to 55% after CABG  cardiac PET scan showing scar in the apical and periapical region,  mural thrombus seen in July 2015, started on anticoagulation,   hyperlipidemia,   who presents for routine follow-up of his coronary artery disease   Sent by PMD for irregular beats on auscultation, Uncertain if it is PVC or other arrhythmia, Does not feel it Active at the gym goes on a regular basis No chest pain or shortness of breath on exertion Does not check his blood pressure at home  Recently seen by primary care noted to have low platelets Follow-up lab work has been ordered  Previously cholesterol at goal Tolerating his Lipitor previous EKG from primary care reviewed showing normal sinus rhythm With concern for old anterior 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 They had talked about doing a cardiac catheterization but instead did a PET viability study.   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  PSH:    Past Surgical History:  Procedure Laterality Date  . BICEPS TENDON REPAIR Right 1993  . CARDIAC CATHETERIZATION N/A 07/05/2015   Procedure: Left Heart Cath and Coronary Angiography;  Surgeon: Shannon Chung;  Location: [facility];  Service: Cardiovascular;  Laterality: N/A;  . CATARACT EXTRACTION Left 12/2016   with keratoplasty  . COLONOSCOPY  2007  . COLONOSCOPY WITH PROPOFOL N/A 12/02/2017   TA, SSA, rpt 3 yrs(Shannon Chung)  . CORONARY ARTERY BYPASS GRAFT N/A 07/29/2015   Procedure: CORONARY ARTERY BYPASS GRAFTING (CABG) x 5 (LIMA to LAD, SVG to DIAGONAL, SVG SEQUENTIALLY to OM1 and OM2, SVG to OM3) with Endoscopic Vein Havesting of GREATER SAPHENOUS VEIN from RIGHT THIGH and partial LOWER LEG ;  Surgeon: Shannon Chung;  Location: [place] [facility];  Service:  Open Heart Surgery;  Laterality: N/A;  . EYE SURGERY     Chung/l cataract and cornea replaced   . JOINT REPLACEMENT    . KNEE ARTHROSCOPY Left remote  . right biceps tendon     repair/re attachment   . SKIN CANCER  EXCISION  10/2015   BCC - L ala (pending MOHs) and L scapula (complete excision)  . TEE WITHOUT CARDIOVERSION N/A 07/29/2015   Procedure: TRANSESOPHAGEAL ECHOCARDIOGRAM (TEE);  Surgeon: Shannon Chung;  Location: [facility];  Service: Open Heart Surgery;  Laterality: N/A;  . TONSILLECTOMY  1949  . TOTAL KNEE ARTHROPLASTY Left 03/18/2016   cemented L TKR; Shannon Leys, Chung    Current Outpatient Medications  Medication Sig Dispense Refill  . acetaminophen (TYLENOL) 500 MG tablet Take 500 mg by mouth every 6 (six) hours as needed for mild pain.     Marland Kitchen aspirin (ASPIRIN EC) 81 MG EC tablet Take 162 mg by mouth daily. Swallow whole.     Marland Kitchen atorvastatin (LIPITOR) 40 MG tablet TAKE 1 TABLET (40 MG TOTAL) BY MOUTH DAILY. 90 tablet 3  . blood glucose meter kit and supplies KIT Dispense based on patient and insurance preference (one touch ultra mini silver). Use once daily and as needed E11.9 1 each 0  . Cholecalciferol (VITAMIN D) 2000 units CAPS Take 1 capsule (2,000 Units total) daily by mouth. 30 capsule   . fluticasone (FLONASE) 50 MCG/ACT nasal spray Place 1 spray into both nostrils as needed for allergies. Reported on 03/18/2016    . glucose blood test strip Use to check once daily and as needed (onetouch) E11.9 100 each 3  . Lancets (ONETOUCH ULTRASOFT) lancets Use to check once daily and as needed E11.9 100 each 3  . lansoprazole (PREVACID) 30 MG capsule Take 1 capsule (30 mg total) by mouth every morning. 90 capsule 3  . lisinopril (PRINIVIL,ZESTRIL) 2.5 MG tablet TAKE 1 TABLET (2.5 MG TOTAL) BY MOUTH DAILY. 90 tablet 3  . metFORMIN (GLUCOPHAGE) 500 MG tablet Take 1 tablet (500 mg total) daily by mouth. 30 tablet 6  . metoprolol succinate (TOPROL-XL) 25 MG 24 hr tablet TAKE 1 TABLET (25 MG TOTAL) BY MOUTH DAILY. 90 tablet 3  . prednisoLONE acetate (PRED FORTE) 1 % ophthalmic suspension INSTILL 1 DROP IN SURGICAL EYE 4 TIMES A DAY  3  . ranitidine (ZANTAC) 150 MG tablet TAKE 1 TABLET (150 MG TOTAL)  BY MOUTH AT BEDTIME. 180 tablet 1  . sildenafil (REVATIO) 20 MG tablet Take 2-4 tablets (40-80 mg total) by mouth daily as needed (relations). 30 tablet 3  . vitamin Chung-12 (CYANOCOBALAMIN) 500 MCG tablet Take 500 mcg by mouth daily. Reported on 03/17/2016     No current facility-administered medications for this visit.      Allergies:   Patient has no known allergies.   Social History:  The patient  reports that he quit smoking about 39 years ago. His smoking use included cigarettes. He has a 5.00 pack-year smoking history. He has never used smokeless tobacco. He reports that he drinks alcohol. He reports that he does not use drugs.   Family History:   family history includes Alcoholism in his father; CAD (age of onset: 60) in his father; Diabetes in his father; Hyperlipidemia in his father; Hypertension in his father.    Review of Systems: Review of Systems  Constitutional: Negative.   Respiratory: Negative.   Cardiovascular: Negative.   Gastrointestinal: Negative.   Musculoskeletal: Positive for joint pain.  Neurological: Negative.   Psychiatric/Behavioral: Negative.   All other systems reviewed and are negative.    PHYSICAL EXAM: VS:  BP 134/70 (BP Location: Left Arm, Patient Position: Sitting, Cuff Size: Normal)   Pulse 75   Ht 6' (1.829 m)   Wt 231 lb 8 oz (105 kg)   BMI 31.40 kg/m  , BMI Body mass index is 31.4 kg/m.  Constitutional:  oriented to person, place, and time. No distress.  HENT:  Head: Normocephalic and atraumatic.  Eyes:  no discharge. No scleral icterus.  Neck: Normal range of motion. Neck supple. No JVD present.  Cardiovascular: Normal rate, regular rhythm, normal heart sounds and intact distal pulses. Exam reveals no gallop and no friction rub. No edema No murmur heard. Pulmonary/Chest: Effort normal and breath sounds normal. No stridor. No respiratory distress.  no wheezes.  no rales.  no tenderness.  Abdominal: Soft.  no distension.  no tenderness.   Musculoskeletal: Normal range of motion.  no  tenderness or deformity.  Neurological:  normal muscle tone. Coordination normal. No atrophy Skin: Skin is warm and dry. No rash noted. not diaphoretic.  Psychiatric:  normal mood and affect. behavior is normal. Thought content normal.     Recent Labs: 11/29/2017: BUN 18; Creatinine, Ser 1.00; Hemoglobin 15.1; Platelets 129; Potassium 4.2; Sodium 139    Lipid Panel Lab Results  Component Value Date   CHOL 123 02/18/2017   HDL 45.10 02/18/2017   LDLCALC 58 02/18/2017   TRIG 97.0 02/18/2017      Wt Readings from Last 3 Encounters:  02/14/18 231 lb 8 oz (105 kg)  02/10/18 237 lb 3.2 oz (107.6 kg)  01/19/18 237 lb (107.5 kg)       ASSESSMENT AND PLAN:  Ischemic cardiomyopathy - Plan: EKG 12-Lead Ejection fraction improved after bypass surgery  euvolemic, Blood pressure stable, good exercise tolerance with no anginal symptoms  Mixed hyperlipidemia - Plan: EKG 12-Lead Work through primary care Previously at goal, continue Lipitor We will wait for new numbers  Essential hypertension - Plan: EKG 12-Lead Blood pressure is well controlled on today's visit. No changes made to the medications.  Hx of CABG - Plan: EKG 12-Lead Discussed anginal symptoms to watch for  PVCs  seen on EKG and appreciated on auscultation today He is asymptomatic, Long discussion with him concerning pathology He will continue to monitor these at home but currently does not have symptoms and we will continue his current medication regimen He will monitor these at home with his pulse meter on his smart phone If it appears he is continuing to have these in high frequency Holter monitor could be done to estimate his burden We did discuss increasing his metoprolol but have kept the current dose for now  Obesity Exercising on a regular basis, weight trending down   Total encounter time more than 25 minutes  Greater than 50% was spent in counseling and  coordination of care with the patient   Disposition:   F/U  12 months   No orders of the defined types were placed in this encounter.    Signed, Esmond Plants, M.D., Ph.D. 02/14/2018  [place], [place]

## 2018-02-14 ENCOUNTER — Telehealth: Payer: Self-pay | Admitting: *Deleted

## 2018-02-14 ENCOUNTER — Encounter: Payer: Self-pay | Admitting: Cardiovascular Disease

## 2018-02-14 ENCOUNTER — Encounter: Payer: Self-pay | Admitting: Internal Medicine

## 2018-02-14 ENCOUNTER — Ambulatory Visit: Payer: Medicare Other | Admitting: Cardiovascular Disease

## 2018-02-14 VITALS — BP 134/70 | HR 75 | Ht 72.0 in | Wt 231.5 lb

## 2018-02-14 DIAGNOSIS — I25118 Atherosclerotic heart disease of native coronary artery with other forms of angina pectoris: Secondary | ICD-10-CM

## 2018-02-14 DIAGNOSIS — I10 Essential (primary) hypertension: Secondary | ICD-10-CM | POA: Diagnosis not present

## 2018-02-14 DIAGNOSIS — E782 Mixed hyperlipidemia: Secondary | ICD-10-CM | POA: Diagnosis not present

## 2018-02-14 DIAGNOSIS — I255 Ischemic cardiomyopathy: Secondary | ICD-10-CM | POA: Diagnosis not present

## 2018-02-14 DIAGNOSIS — R413 Other amnesia: Secondary | ICD-10-CM | POA: Insufficient documentation

## 2018-02-14 DIAGNOSIS — I493 Ventricular premature depolarization: Secondary | ICD-10-CM | POA: Diagnosis not present

## 2018-02-14 DIAGNOSIS — Z951 Presence of aortocoronary bypass graft: Secondary | ICD-10-CM

## 2018-02-14 DIAGNOSIS — D696 Thrombocytopenia, unspecified: Secondary | ICD-10-CM

## 2018-02-14 DIAGNOSIS — R7303 Prediabetes: Secondary | ICD-10-CM | POA: Diagnosis not present

## 2018-02-14 HISTORY — DX: Ventricular premature depolarization: I49.3

## 2018-02-14 HISTORY — DX: Thrombocytopenia, unspecified: D69.6

## 2018-02-14 NOTE — Telephone Encounter (Signed)
-----   Message from Minna Merritts, MD sent at 02/12/2018  3:10 PM EDT ----- Triage can we call Mr. Mckinnon Please ask him if he would like to evaluate his irregular heartbeat Would recommend monitor for 2 weeks, Zio  I can perhaps follow-up with him after results are back, we can review together thx TGollan  ----- Message ----- From: [name], [name], MD Sent: 02/10/2018   1:40 PM To: Minna Merritts, MD  Seeing pt in clinic now and heart beat is irregular getting EKG to w/u PVCs r/o Afib   Advised him to call your office and make appt   [place]

## 2018-02-14 NOTE — Patient Instructions (Addendum)
Search for pulse meter  Download any free programs to track heart rate   You are having PVCs   Medication Instructions:   No medication changes made  Labwork:  No new labs needed  Testing/Procedures:  No further testing at this time   Follow-Up: It was a pleasure seeing you in the office today. Please call us if you have new issues that need to be addressed before your next appt.  [phone]  Your physician wants you to follow-up in: 12 months.  You will receive a reminder letter in the mail two months in advance. If you don't receive a letter, please call our office to schedule the follow-up appointment.  If you need a refill on your cardiac medications before your next appointment, please call your pharmacy.  For educational health videos Log in to : [email or web address] Or : [email or web address], password : [email or web address]    Premature Ventricular Contraction A premature ventricular contraction (PVC) is a common irregularity in the normal heart rhythm. These contractions are extra heartbeats that start in the heart ventricles and occur too early in the normal sequence. During the PVC, the heart's normal electrical pathway is not used, so the beat is shorter and less effective. In most cases, these contractions come and go and do not require treatment. What are the causes? In many cases, the cause may not be known. Common causes of the condition include:  Smoking.  Drinking alcohol.  Caffeine.  Certain medicines.  Some illegal drugs.  Stress.  Certain medical conditions can also cause PVCs:  Changes in minerals in the blood (electrolytes).  Heart failure.  Heart valve problems.  Low blood oxygen levels or high carbon dioxide levels.  Heart attack, or coronary artery disease.  What are the signs or symptoms? The main symptom of this condition is a fast or skipped heartbeat (palpitations). Other symptoms include:  Chest pain.  Shortness of breath.  Feeling  tired.  Dizziness.  In some cases, there are no symptoms. How is this diagnosed? This condition may be diagnosed based on:  Your medical history.  A physical exam. During the exam, the health care provider will check for irregular heartbeats.  Tests, such as: ? An ECG (electrocardiogram) to monitor the electrical activity of your heart. ? Holter monitor testing. This involves wearing a device that clips to your clothing and monitors the electrical activity of your heart over longer periods of time. ? Stress tests to see how exercise affects your heart rhythm and blood supply. ? Echocardiogram. This test uses sound waves (ultrasound) to produce an image of your heart. ? Electrophysiology study. This test checks the electric pathways in your heart.  How is this treated? Treatment depends on any underlying conditions, the type of PVCs that you are having, and how much the symptoms are interfering with your daily life. Possible treatments include:  Avoiding things that can trigger the premature contractions, such as caffeine or alcohol.  Medicines. These may be given if symptoms are severe or if the extra heartbeats are frequent.  Treatment for any underlying condition that is found to be the cause of the contractions.  Catheter ablation. This procedure destroys the heart tissues that send abnormal signals.  In some cases, no treatment is required. Follow these instructions at home: Lifestyle Follow these instructions as told by your health care provider:  Do not use any products that contain nicotine or tobacco, such as cigarettes and e-cigarettes. If you need help quitting, ask your health care provider.  If  caffeine triggers episodes of PVC, do not eat, drink, or use anything with caffeine in it.  If caffeine does not seem to trigger episodes, consume caffeine in moderation.  If alcohol triggers episodes of PVC, do not drink alcohol.  If alcohol does not seem to trigger  episodes, limit alcohol intake to no more than 1 drink a day for nonpregnant women and 2 drinks a day for men. One drink equals 12 oz of beer, 5 oz of wine, or 1 oz of hard liquor.  Exercise regularly. Ask your health care provider what type of exercise is safe for you.  Find healthy ways to manage stress. Avoid stressful situations when possible.  Try to get at least 7-9 hours of sleep each night, or as much as recommended by your health care provider.  Do not use illegal drugs.  General instructions  Take over-the-counter and prescription medicines only as told by your health care provider.  Keep all follow-up visits as told by your health care provider. This is important. Get help right away if:  You feel palpitations that are frequent or continual.  You have chest pain.  You have shortness of breath.  You have sweating for no reason.  You have nausea and vomiting.  You become light-headed or you faint. This information is not intended to replace advice given to you by your health care provider. Make sure you discuss any questions you have with your health care provider. Document Released: 06/05/2004 Document Revised: 06/12/2016 Document Reviewed: 03/25/2016 Elsevier Interactive Patient Education  [employer].

## 2018-02-14 NOTE — Telephone Encounter (Signed)
When about to call patient, realized patient saw Dr Rockey Situ at 0800 this morning. No further action at this time.

## 2018-02-14 NOTE — Progress Notes (Signed)
Smear needs to be reordered future prior to lab appt.

## 2018-02-14 NOTE — Progress Notes (Signed)
Chief Complaint  Patient presents with  . Follow-up    transfer from [facility]  1. Pt brings in form from GI rec cirrhosis w/u for low platelets which he has had since 2016 but did not know. Indication on paperwork from GI is cirrhosis with [place] ordered and labs rec. He wants his PCP work up for now  2. C/o memory problems short term fiance has noticed will address at f/u    Review of Systems  Constitutional: Negative for weight loss.  HENT: Negative for hearing loss.   Eyes: Negative for blurred vision.  Respiratory: Negative for shortness of breath.   Cardiovascular: Negative for chest pain.  Gastrointestinal: Negative for abdominal pain.  Musculoskeletal: Negative for falls.  Skin: Negative for rash.  Neurological: Negative for headaches.  Endo/Heme/Allergies:       Low platelets   Psychiatric/Behavioral: Positive for memory loss.   Past Medical History:  Diagnosis Date  . Coronary artery disease    a. 06/2015 Cardiac CT: Ca score 1103 (84th %'ile);  b. 07/2015 Cath: LM 70, LAD 80p, 100/58m D1 70, D2 95, RI 75, RCA 100p/m;  c. 07/2015 CABG x 5 (LIMA->LAD, VG->Diag, VG->OM1->OM2, VG->OM3).  . Diabetes mellitus without complication (HClimax 93/7628 . Dyslipidemia   . Essential hypertension   . 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 Fuchs' corneal dystrophy 2016   sees Dr KMaudie Mercury . GERD (gastroesophageal reflux disease)   . History of radiation exposure    right vocal cord squamous cell cancer  . Impingement syndrome of right shoulder 10/2015   s/p steroid injection Dr MSabra Heck . Ischemic cardiomyopathy    a. dilated, EF 35% improved to 45-50% (2015);  b. 07/2015 EF 25-35% by LV gram.  . 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 isolated episode, not on [place]  .Marland KitchenMural thrombus of cardiac apex    a. 06/2014: LV; resolved with coumadin-->no residual on f/u echo, no longer on coumadin.  . Osteoarthritis    a. R-shoulder, L-knee  (Sabra Heckortho)  . Skin cancer    squamous and basal, sees derm regularly  . 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 Vitamin D deficiency    Past Surgical History:  Procedure Laterality Date  . BICEPS TENDON REPAIR Right 1993  . CARDIAC CATHETERIZATION N/A 07/05/2015   Procedure: Left Heart Cath and Coronary Angiography;  Surgeon: TMinna Merritts MD;  [facility];  Service: Cardiovascular;  Laterality: N/A;  . CATARACT EXTRACTION Left 12/2016   with keratoplasty  . COLONOSCOPY  2007  . COLONOSCOPY WITH PROPOFOL N/A 12/02/2017   TA, SSA, rpt 3 yrs(Tahiliani, Varnita B, MD)  . CORONARY ARTERY BYPASS GRAFT N/A 07/29/2015   Procedure: CORONARY ARTERY BYPASS GRAFTING (CABG) x 5 (LIMA to LAD, SVG to DIAGONAL, SVG SEQUENTIALLY to OM1 and OM2, SVG to OM3) with Endoscopic Vein Havesting of GREATER SAPHENOUS VEIN from RIGHT THIGH and partial LOWER LEG ;  Surgeon: BGaye Pollack MD;  [facility];  Service: Open Heart Surgery;  Laterality: N/A;  . JOINT REPLACEMENT    . KNEE ARTHROSCOPY Left remote  . SKIN CANCER EXCISION  10/2015   BCC - L ala (pending MOHs) and L scapula (complete excision)  . TEE WITHOUT CARDIOVERSION N/A 07/29/2015   Procedure: TRANSESOPHAGEAL ECHOCARDIOGRAM (TEE);  Surgeon: BGaye Pollack MD;  [facility]  Service: Open  Heart Surgery;  Laterality: N/A;  . TONSILLECTOMY  1949  . TOTAL KNEE ARTHROPLASTY Left 03/18/2016   cemented L TKR; Earnestine Leys, MD   Family History  Problem Relation Age of Onset  . CAD Father 53       MI  . Hypertension Father   . Hyperlipidemia Father   . Alcoholism Father   . Diabetes Father    Social History   Socioeconomic History  . Marital status: Divorced    Spouse name: Not on file  . Number of children: Not on file  . Years of education: Not on file  . Highest education level: Not on file  Occupational History  . Not on file  Social Needs  . Financial resource strain: Not on file  .  Food insecurity:    Worry: Not on file    Inability: Not on file  . Transportation needs:    Medical: Not on file    Non-medical: Not on file  Tobacco Use  . Smoking status: Former Smoker    Packs/day: 0.50    Years: 10.00    Pack years: 5.00    Types: Cigarettes    Last attempt to quit: 11/02/1978    Years since quitting: 39.3  . Smokeless tobacco: Never Used  Substance and Sexual Activity  . Alcohol use: Yes    Alcohol/week: 0.0 oz    Comment: beer/wine on weekends  . Drug use: No  . Sexual activity: Yes  Lifestyle  . Physical activity:    Days per week: Not on file    Minutes per session: Not on file  . Stress: Not on file  Relationships  . Social connections:    Talks on phone: Not on file    Gets together: Not on file    Attends religious service: Not on file    Active member of club or organization: Not on file    Attends meetings of clubs or organizations: Not on file    Relationship status: Not on file  . Intimate partner violence:    Fear of current or ex partner: Not on file    Emotionally abused: Not on file    Physically abused: Not on file    Forced sexual activity: Not on file  Other Topics Concern  . Not on file  Social History Narrative   Lives with fiancee for 51yr ([place] Cl[place]   [place]    [place]   Occupation Retired ANurse, mental health  Edu: 1 yr college   Activity: volunteers at [employer] good water, fruits/vegetables daily      Tested at risk for OSA in preop for CABG   Current Meds  Medication Sig  . acetaminophen (TYLENOL) 500 MG tablet Take 500 mg by mouth every 6 (six) hours as needed for mild pain.   .Marland Kitchenaspirin (ASPIRIN EC) 81 MG EC tablet Take 162 mg by mouth daily. Swallow whole.   .Marland Kitchenatorvastatin (LIPITOR) 40 MG tablet TAKE 1 TABLET (40 MG TOTAL) BY MOUTH DAILY.  . blood glucose meter kit and supplies KIT Dispense based on patient and insurance preference (one touch ultra mini silver). Use once daily and as needed  E11.9  . Cholecalciferol (VITAMIN D) 2000 units CAPS Take 1 capsule (2,000 Units total) daily by mouth.  . fluticasone (FLONASE) 50 MCG/ACT nasal spray Place 1 spray into both nostrils as needed for allergies. Reported on 03/18/2016  . glucose blood test strip Use to check once daily  and as needed (onetouch) E11.9  . Lancets (ONETOUCH ULTRASOFT) lancets Use to check once daily and as needed E11.9  . lansoprazole (PREVACID) 30 MG capsule Take 1 capsule (30 mg total) by mouth every morning.  Marland Kitchen lisinopril (PRINIVIL,ZESTRIL) 2.5 MG tablet TAKE 1 TABLET (2.5 MG TOTAL) BY MOUTH DAILY.  . metFORMIN (GLUCOPHAGE) 500 MG tablet Take 1 tablet (500 mg total) daily by mouth.  . metoprolol succinate (TOPROL-XL) 25 MG 24 hr tablet TAKE 1 TABLET (25 MG TOTAL) BY MOUTH DAILY.  Marland Kitchen prednisoLONE acetate (PRED FORTE) 1 % ophthalmic suspension INSTILL 1 DROP IN SURGICAL EYE 4 TIMES A DAY  . ranitidine (ZANTAC) 150 MG tablet TAKE 1 TABLET (150 MG TOTAL) BY MOUTH AT BEDTIME.  . sildenafil (REVATIO) 20 MG tablet Take 2-4 tablets (40-80 mg total) by mouth daily as needed (relations).  . vitamin B-12 (CYANOCOBALAMIN) 500 MCG tablet Take 500 mcg by mouth daily. Reported on 03/17/2016   No Known Allergies Recent Results (from the past 2160 hour(s))  CBC w/Diff/Platelet     Status: Abnormal   Collection Time: 11/29/17 10:39 AM  Result Value Ref Range   WBC 6.0 3.8 - 10.6 K/uL   RBC 4.70 4.40 - 5.90 MIL/uL   Hemoglobin 15.1 13.0 - 18.0 g/dL   HCT 45.3 40.0 - 52.0 %   MCV 96.3 80.0 - 100.0 fL   MCH 32.0 26.0 - 34.0 pg   MCHC 33.3 32.0 - 36.0 g/dL   RDW 14.2 11.5 - 14.5 %   Platelets 129 (L) 150 - 440 K/uL   Neutrophils Relative % 56 %   Neutro Abs 3.4 1.4 - 6.5 K/uL   Lymphocytes Relative 34 %   Lymphs Abs 2.0 1.0 - 3.6 K/uL   Monocytes Relative 7 %   Monocytes Absolute 0.4 0.2 - 1.0 K/uL   Eosinophils Relative 2 %   Eosinophils Absolute 0.1 0 - 0.7 K/uL   Basophils Relative 1 %   Basophils Absolute 0.0 0 - 0.1  K/uL    Comment: Performed at [facility], [address] [place], [place] [address]  Basic Metabolic Panel (BMET)     Status: Abnormal   Collection Time: 11/29/17 10:39 AM  Result Value Ref Range   Sodium 139 135 - 145 mmol/L   Potassium 4.2 3.5 - 5.1 mmol/L   Chloride 104 101 - 111 mmol/L   CO2 25 22 - 32 mmol/L   Glucose, Bld 170 (H) 65 - 99 mg/dL   BUN 18 6 - 20 mg/dL   Creatinine, Ser 1.00 0.61 - 1.24 mg/dL   Calcium 9.3 8.9 - 10.3 mg/dL   GFR calc non Af Amer >60 >60 mL/min   GFR calc Af Amer >60 >60 mL/min    Comment: (NOTE) The eGFR has been calculated using the CKD EPI equation. This calculation has not been validated in all clinical situations. eGFR's persistently <60 mL/min signify possible Chronic Kidney Disease.    Anion gap 10 5 - 15    Comment: Performed at [facility], [address] [place], [place] [address]  Glucose, capillary     Status: Abnormal   Collection Time: 12/02/17  7:27 AM  Result Value Ref Range   Glucose-Capillary 132 (H) 65 - 99 mg/dL  Surgical pathology     Status: None   Collection Time: 12/02/17  7:57 AM  Result Value Ref Range   SURGICAL PATHOLOGY      Surgical Pathology CASE: [id number] PATIENT: [name] Surgical Pathology Report  SPECIMEN SUBMITTED: A. Colon polyp x4; cold snare  CLINICAL HISTORY: None provided  PRE-OPERATIVE DIAGNOSIS: Screening colonoscopy Z12.11 + cologuard  POST-OPERATIVE DIAGNOSIS: Diverticulosis, colon polyps     DIAGNOSIS: A. 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 - NEGATIVE FOR HIGH-GRADE DYSPLASIA AND MALIGNANCY.  COMMENT: Sessile serrated adenomas (SSAs) are often difficult to detect endoscopically as they are typically broad flat lesions found in the proximal colon. By morphologic definition, SSAs demonstrate architectural (low grade) dysplasia, with or without concurrent cytologic dysplasia. SSAs are  thought to be precursor lesions to a subset of colonic adenocarcinomas that arise through the serrated neoplastic pathway rather than the classical neoplasia pathway. Lesions of the serrated pathway have a high fr equency of BRAF mutation and are more likely to be microsatellite unstable compared to tubular / villous adenomas of the classical pathway.  The recommended surveillance interval for a SSA <10 mm with no cytologic dysplasia is 5 years.  A SSA ?10 mm and a sessile serrated polyp with cytological dysplasia should be managed like a high risk adenoma (recommended surveillance interval of 3 years).  Serrated polyposis syndrome, as defined by WHO, should be considered with one of the following criteria: (1) at least 5 serrated polyps proximal to sigmoid, with 2 or more ?10 mm; (2) any serrated polyps proximal to sigmoid with family history of serrated polyposis syndrome; and (3) >20 serrated polyps of any size throughout the colon.  References: Guidelines for Colonoscopy Surveillance After Screening and Polypectomy: A Consensus Update by the [place] Multi-Society Task Force on Colorectal Cancer. [place] [facility], 2012.    GROSS DESCRIPTION:  A. Label ed: cold polyp cold snare 4  Tissue fragment(s): multiple  Size: aggregate, 1.1 x 0.4 x 0.1 cm  Description: in formalin, pink fragments and yellow to green fecal material  Entirely submitted in one cassette(s).          Final Diagnosis performed by Quay Burow, MD.  Electronically signed 12/03/2017 8:57:24AM    The electronic signature indicates that the named Attending Pathologist has evaluated the specimen  Technical component performed at [facility], [address] [place], [place] [address] Lab: [phone] Dir: Rush Farmer, MD, MMM  Professional component performed at [facility], [facility], [place], [place], [place] [address] Lab: [phone] Dir: Dellia Nims.  Rubinas, MD     Objective  Body mass index is 32.17 kg/m. Wt Readings from Last 3 Encounters:  02/10/18 237 lb 3.2 oz (107.6 kg)  01/19/18 237 lb (107.5 kg)  12/02/17 225 lb (102.1 kg)   Temp Readings from Last 3 Encounters:  02/10/18 98.3 F (36.8 C) (Oral)  01/19/18 98.1 F (36.7 C) (Oral)  12/02/17 (!) 96.7 F (35.9 C)   BP Readings from Last 3 Encounters:  02/10/18 122/62  01/19/18 128/73  12/02/17 (!) 143/96   Pulse Readings from Last 3 Encounters:  02/10/18 72  01/19/18 65  12/02/17 69    Physical Exam  Constitutional: He is oriented to person, place, and time. Vital signs are normal. He appears well-developed and well-nourished.  HENT:  Head: Normocephalic and atraumatic.  Mouth/Throat: Oropharynx is clear and moist and mucous membranes are normal.  Eyes: Pupils are equal, round, and reactive to light. Conjunctivae are normal.  Cardiovascular: Normal rate and regular rhythm.  irreg heart sounds ? PVCs vs other   Pulmonary/Chest: Effort normal and breath sounds normal.  Neurological: He is alert and oriented to person, place, and time. Gait normal.  Skin: Skin is warm, dry and intact.  Psychiatric: He has a normal mood and affect. His speech is normal and behavior is normal. Judgment and thought content normal. Cognition and memory are normal.  Nursing note and vitals reviewed.   Assessment   1. 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 Memory problems  3. PSA elevating since 10/14/15 1.60 to 3.17  4. Irregular heart beat  5. HM Plan  1.  Work up with labs per GI CMET, hep B labs core total, IgM, SAb and Ag, Hep A total, IgM, HCV, INR, US abdomen, pt declines HIV test. Only drinks beer sparingly  Will do CBC, peripheral smear as well repeat this  Disc with pt will consider H/O referral if no etiology found  2. Do MMSE at f/u  3. Recheck PSA  If elevating refer urology further w/u  4. EKG today NSR TWI in Avl, v2,  no clear pvcs on EKG  Will have pt f/u cards Dr. Rockey Situ 5.  Had flu, prevnar, pna 23, Tdap, Zoster, consider shingrix in future   Check other labs 02/21/18 PSA, lipid, A1C, TSH, UA, urine protein  Colonoscopy [facility] 12/02/17 rec repeat in 3 years sessile serrated and tubular  F/u dermatology Dr. Kellie Moor h/o [facility], SCC Former smoker 1 pk/week [phone] no FH lung cancer, no chew  Will need to ask about eye exam and do foot exam in future    "I spent 35 + minutes face-to face with patient with greater than 50% of time spent counseling and/or in coordination of care as above mentioned   Provider: Dr. Olivia Mackie [name]-Internal Medicine

## 2018-02-15 ENCOUNTER — Ambulatory Visit
Admission: RE | Admit: 2018-02-15 | Discharge: 2018-02-15 | Disposition: A | Discharge status: Home / Self Care | Payer: Medicare Other | Source: Ambulatory Visit | Attending: Internal Medicine | Admitting: Internal Medicine

## 2018-02-15 DIAGNOSIS — D696 Thrombocytopenia, unspecified: Secondary | ICD-10-CM | POA: Diagnosis not present

## 2018-02-16 NOTE — Addendum Note (Signed)
Addended by: Orland Mustard on: 02/16/2018 10:16 PM   Modules accepted: Orders

## 2018-02-16 NOTE — Progress Notes (Signed)
Order in  TMS 

## 2018-02-20 ENCOUNTER — Encounter: Payer: Self-pay | Admitting: Internal Medicine

## 2018-02-22 ENCOUNTER — Other Ambulatory Visit: Payer: Medicare Other

## 2018-02-22 ENCOUNTER — Ambulatory Visit: Payer: Medicare Other

## 2018-02-24 ENCOUNTER — Other Ambulatory Visit: Payer: Medicare Other

## 2018-02-25 ENCOUNTER — Other Ambulatory Visit: Payer: Medicare Other

## 2018-02-28 ENCOUNTER — Encounter: Payer: Medicare Other | Admitting: Family Medicine

## 2018-02-28 ENCOUNTER — Other Ambulatory Visit (INDEPENDENT_AMBULATORY_CARE_PROVIDER_SITE_OTHER): Payer: Medicare Other

## 2018-02-28 DIAGNOSIS — D696 Thrombocytopenia, unspecified: Secondary | ICD-10-CM

## 2018-02-28 DIAGNOSIS — Z1159 Encounter for screening for other viral diseases: Secondary | ICD-10-CM

## 2018-02-28 DIAGNOSIS — Z1329 Encounter for screening for other suspected endocrine disorder: Secondary | ICD-10-CM

## 2018-02-28 DIAGNOSIS — Z125 Encounter for screening for malignant neoplasm of prostate: Secondary | ICD-10-CM

## 2018-02-28 DIAGNOSIS — K7460 Unspecified cirrhosis of liver: Secondary | ICD-10-CM | POA: Diagnosis not present

## 2018-02-28 DIAGNOSIS — Z13818 Encounter for screening for other digestive system disorders: Secondary | ICD-10-CM

## 2018-02-28 DIAGNOSIS — R972 Elevated prostate specific antigen [PSA]: Secondary | ICD-10-CM

## 2018-02-28 DIAGNOSIS — E119 Type 2 diabetes mellitus without complications: Secondary | ICD-10-CM

## 2018-02-28 DIAGNOSIS — Z13220 Encounter for screening for lipoid disorders: Secondary | ICD-10-CM

## 2018-02-28 LAB — COMPREHENSIVE METABOLIC PANEL
ALK PHOS: 50 U/L (ref 39–117)
ALT: 19 U/L (ref 0–53)
AST: 22 U/L (ref 0–37)
Albumin: 4.6 g/dL (ref 3.5–5.2)
BILIRUBIN TOTAL: 0.7 mg/dL (ref 0.2–1.2)
BUN: 17 mg/dL (ref 6–23)
CO2: 30 meq/L (ref 19–32)
Calcium: 9.4 mg/dL (ref 8.4–10.5)
Chloride: 103 mEq/L (ref 96–112)
Creatinine, Ser: 1.03 mg/dL (ref 0.40–1.50)
GFR: 74.92 mL/min (ref >60.00)
GLUCOSE: 99 mg/dL (ref 70–99)
POTASSIUM: 4.2 meq/L (ref 3.5–5.1)
Sodium: 140 mEq/L (ref 135–145)
TOTAL PROTEIN: 7.3 g/dL (ref 6.0–8.3)

## 2018-02-28 LAB — URINALYSIS, ROUTINE W REFLEX MICROSCOPIC
Bilirubin Urine: NEGATIVE
Hgb urine dipstick: NEGATIVE
Ketones, ur: NEGATIVE
Leukocytes, UA: NEGATIVE
Nitrite: NEGATIVE
PH: 5.5 (ref 5.0–8.0)
RBC / HPF: NONE SEEN (ref 0–?)
SPECIFIC GRAVITY, URINE: 1.025 (ref 1.000–1.030)
TOTAL PROTEIN, URINE-UPE24: NEGATIVE
URINE GLUCOSE: NEGATIVE
Urobilinogen, UA: 0.2 (ref 0.0–1.0)

## 2018-02-28 LAB — EXTRA LAV TOP TUBE

## 2018-02-28 LAB — MICROALBUMIN / CREATININE URINE RATIO
Creatinine,U: 152.2 mg/dL
Microalb Creat Ratio: 0.8 mg/g (ref 0.0–30.0)
Microalb, Ur: 1.2 mg/dL (ref 0.0–1.9)

## 2018-02-28 LAB — LIPID PANEL
CHOL/HDL RATIO: 3
Cholesterol: 127 mg/dL (ref 0–200)
HDL: 50.2 mg/dL (ref >39.00)
LDL CALC: 59 mg/dL (ref 0–99)
NonHDL: 76.56
TRIGLYCERIDES: 90 mg/dL (ref 0.0–149.0)
VLDL: 18 mg/dL (ref 0.0–40.0)

## 2018-02-28 LAB — PSA, MEDICARE: PSA: 3.52 ng/ml (ref 0.10–4.00)

## 2018-02-28 LAB — TSH: TSH: 1.73 u[IU]/mL (ref 0.35–4.50)

## 2018-02-28 LAB — HEMOGLOBIN A1C: Hgb A1c MFr Bld: 7 % — ABNORMAL HIGH (ref 4.6–6.5)

## 2018-02-28 LAB — PROTIME-INR
INR: 1 ratio (ref 0.8–1.0)
PROTHROMBIN TIME: 11.8 s (ref 9.6–13.1)

## 2018-02-28 NOTE — Addendum Note (Signed)
Addended by: Arby Barrette on: 02/28/2018 08:32 AM   Modules accepted: Orders

## 2018-03-01 ENCOUNTER — Ambulatory Visit: Payer: Medicare Other | Admitting: Internal Medicine

## 2018-03-01 ENCOUNTER — Encounter: Payer: Self-pay | Admitting: Cardiovascular Disease

## 2018-03-01 LAB — CBC WITH DIFFERENTIAL/PLATELET
BASOS PCT: 1.2 %
Basophils Absolute: 78 cells/uL (ref 0–200)
EOS PCT: 2.8 %
Eosinophils Absolute: 182 cells/uL (ref 15–500)
HCT: 41.7 % (ref 38.5–50.0)
HEMOGLOBIN: 14.6 g/dL (ref 13.2–17.1)
Lymphs Abs: 2763 cells/uL (ref 850–3900)
MCH: 32.7 pg (ref 27.0–33.0)
MCHC: 35 g/dL (ref 32.0–36.0)
MCV: 93.3 fL (ref 80.0–100.0)
MPV: 13.7 fL — AB (ref 7.5–12.5)
Monocytes Relative: 9.8 %
NEUTROS ABS: 2841 {cells}/uL (ref 1500–7800)
Neutrophils Relative %: 43.7 %
Platelets: 143 10*3/uL (ref 140–400)
RBC: 4.47 10*6/uL (ref 4.20–5.80)
RDW: 13 % (ref 11.0–15.0)
Total Lymphocyte: 42.5 %
WBC mixed population: 637 cells/uL (ref 200–950)
WBC: 6.5 10*3/uL (ref 3.8–10.8)

## 2018-03-01 LAB — HEPATITIS B CORE ANTIBODY, TOTAL: Hep B Core Total Ab: REACTIVE — AB

## 2018-03-01 LAB — HEPATITIS B CORE ANTIBODY, IGM: Hep B C IgM: NONREACTIVE

## 2018-03-01 LAB — HEPATITIS B SURFACE ANTIGEN: HEP B S AG: NONREACTIVE

## 2018-03-01 LAB — HEPATITIS A ANTIBODY, IGM: HEP A IGM: NONREACTIVE

## 2018-03-01 LAB — HEPATITIS B SURFACE ANTIBODY, QUANTITATIVE: HEPATITIS B-POST: 778 m[IU]/mL (ref >=10)

## 2018-03-01 LAB — HEPATITIS C ANTIBODY
HEP C AB: NONREACTIVE
SIGNAL TO CUT-OFF: 0.05 (ref <1.00)

## 2018-03-01 LAB — HEPATITIS A ANTIBODY, TOTAL: Hepatitis A AB,Total: NONREACTIVE

## 2018-03-01 LAB — PATHOLOGIST SMEAR REVIEW

## 2018-03-02 ENCOUNTER — Other Ambulatory Visit: Payer: Self-pay | Admitting: Internal Medicine

## 2018-03-02 DIAGNOSIS — B181 Chronic viral hepatitis B without delta-agent: Secondary | ICD-10-CM

## 2018-03-03 LAB — TEST AUTHORIZATION

## 2018-03-07 ENCOUNTER — Telehealth: Payer: Self-pay

## 2018-03-07 ENCOUNTER — Encounter: Payer: Self-pay | Admitting: Gastroenterology

## 2018-03-07 LAB — HEPATITIS B E ANTIBODY: Hep B E Ab: NONREACTIVE

## 2018-03-07 LAB — HEPATITIS B E ANTIGEN: Hep B E Ag: NONREACTIVE

## 2018-03-07 NOTE — Telephone Encounter (Signed)
Please schedule appt

## 2018-03-07 NOTE — Telephone Encounter (Signed)
Copied from [place] [id number]. Topic: Appointment Scheduling - Scheduling Inquiry for Clinic >> Mar 07, 2018  9:40 AM Aurelio Brash B wrote: Reason for CRM: Pt called about making an apt for tomorrow 5/7    but was not sure what the apt would be for- as Fransisco Beau had been talking with im via mychart but says he already had lab apt/   looking at schedule  I orffered him 8am slot but he says it will have to be after 2,   only available  is same day slot-

## 2018-03-08 ENCOUNTER — Other Ambulatory Visit: Payer: Self-pay | Admitting: Internal Medicine

## 2018-03-08 DIAGNOSIS — R972 Elevated prostate specific antigen [PSA]: Secondary | ICD-10-CM

## 2018-03-08 DIAGNOSIS — Z862 Personal history of diseases of the blood and blood-forming organs and certain disorders involving the immune mechanism: Secondary | ICD-10-CM

## 2018-03-17 ENCOUNTER — Telehealth: Payer: Self-pay | Admitting: *Deleted

## 2018-03-17 NOTE — Telephone Encounter (Signed)
Please check on urology referral he has not heard I think patient wants to go to [facility] in [place]   Thanks [employer]

## 2018-03-17 NOTE — Telephone Encounter (Signed)
Copied from [place] [id number]. Topic: General - Other >> Mar 17, 2018  1:15 PM Yvette Rack wrote: Reason for CRM: patient calling stating that he was suppose to have a referral done in [place] for a Urology

## 2018-03-18 NOTE — Telephone Encounter (Signed)
Thanks I put to [facility], [place] then when spoke to pt he wanted to go to [facility] and sent message about change   Thanks [place]

## 2018-03-18 NOTE — Telephone Encounter (Signed)
It was sent to [facility] since that is what was entered into the referral. I have resent it to [facility] in [place]

## 2018-03-21 ENCOUNTER — Encounter: Payer: Self-pay | Admitting: Internal Medicine

## 2018-03-25 ENCOUNTER — Encounter: Payer: Self-pay | Admitting: Internal Medicine

## 2018-03-25 ENCOUNTER — Ambulatory Visit: Payer: Medicare Other | Admitting: Internal Medicine

## 2018-03-25 VITALS — BP 122/76 | HR 69 | Temp 98.2°F | Ht 72.0 in | Wt 231.4 lb

## 2018-03-25 DIAGNOSIS — R972 Elevated prostate specific antigen [PSA]: Secondary | ICD-10-CM | POA: Insufficient documentation

## 2018-03-25 DIAGNOSIS — D696 Thrombocytopenia, unspecified: Secondary | ICD-10-CM

## 2018-03-25 DIAGNOSIS — K760 Fatty (change of) liver, not elsewhere classified: Secondary | ICD-10-CM | POA: Diagnosis not present

## 2018-03-25 DIAGNOSIS — R768 Other specified abnormal immunological findings in serum: Secondary | ICD-10-CM | POA: Insufficient documentation

## 2018-03-25 DIAGNOSIS — I10 Essential (primary) hypertension: Secondary | ICD-10-CM | POA: Diagnosis not present

## 2018-03-25 DIAGNOSIS — E119 Type 2 diabetes mellitus without complications: Secondary | ICD-10-CM | POA: Diagnosis not present

## 2018-03-25 HISTORY — DX: Elevated prostate specific antigen (PSA): R97.20

## 2018-03-25 HISTORY — DX: Other specified abnormal immunological findings in serum: R76.8

## 2018-03-25 NOTE — Progress Notes (Signed)
Pre visit review using our clinic review tool, if applicable. No additional management support is needed unless otherwise documented below in the visit note. 

## 2018-03-25 NOTE — Patient Instructions (Addendum)
Follow up in 4 months  Your memory is perfect!!!  Thrombocytopenia Thrombocytopenia is a condition in which you have an abnormally decreased number of platelets in your blood. Platelets are also called thrombocytes. Platelets are needed for blood clotting. Some cases of thrombocytopenia are mild while others are more severe. What are the causes? This condition may be caused by:  Decreased production of platelets. This can be caused by: ? Aplastic anemia, in which your bone marrow quits making blood cells. ? Cancer in the bone marrow. ? Use of certain medicines, including chemotherapy. ? Infection in the bone marrow. ? Heavy alcohol consumption.  Increased destruction of platelets. This can be caused by: ? Certain immune diseases. ? Use of certain drugs. ? Certain blood clotting disorders. ? Certain inherited disorders. ? Certain bleeding disorders. ? Pregnancy.  Having an enlarged spleen (hypersplenism). In hypersplenism, the spleen gathers up platelets from circulation. This means that the platelets are not available to help with blood clotting. The spleen can be enlarged because of cirrhosis or other conditions.  What are the signs or symptoms? Symptoms of this condition are side effects of poor blood clotting. They will vary depending on how low the platelet counts are. Symptoms may include:  Abnormal bleeding.  Nosebleeds.  Heavy menstrual periods.  Blood in the urine or stool (feces).  A purplish discoloration in the skin (purpura).  Bruising.  A rash that looks like pinpoint, purplish-red spots (petechiae) on the skin and mucous membranes.  How is this diagnosed? This condition may be diagnosed with blood tests and a physical exam. Sometimes, a sample of bone marrow may be removed to look for the original cells (megakaryocytes) that make platelets. How is this treated? Treatment for this condition depends on the cause. Treatment options may include:  Treatment of  another condition that is causing the low platelet count.  Medicines to help protect your platelets from being destroyed.  A replacement (transfusion) of platelets to stop or prevent bleeding.  Surgery to remove the spleen.  Follow these instructions at home: General instructions  Check your skin and the linings inside your mouth for bruising or bleeding as told by your health care provider.  Check your sputum, urine, and stool for blood as told by your health care provider.  Ask your health care provider if it is okay for you to drink alcohol.  Take over-the-counter and prescription medicines only as told by your health care provider.  Tell all of your health care providers, including dentists and eye doctors, about your condition. Activity  Until your health care provider says it is okay. ? Do not return to any activities that could cause bumps or bruises.  Take extra care not to cut yourself when you shave or when you use scissors, needles, knives, and other tools.  Take extra care not to burn yourself when ironing or cooking. Contact a health care provider if:  You have unexplained bruising. Get help right away if:  You have active bleeding from anywhere on your body.  You have blood in your sputum, urine, or stool. This information is not intended to replace advice given to you by your health care provider. Make sure you discuss any questions you have with your health care provider. Document Released: 10/19/2005 Document Revised: 06/21/2016 Document Reviewed: 04/22/2015 Elsevier Interactive Patient Education  2018 [place].    Fatty Liver Fatty liver, also called hepatic steatosis or steatohepatitis, is a condition in which too much fat has built up in  your liver cells. The liver removes harmful substances from your bloodstream. It produces fluids your body needs. It also helps your body use and store energy from the food you eat. In many cases, fatty liver does  not cause symptoms or problems. It is often diagnosed when tests are being done for other reasons. However, over time, fatty liver can cause inflammation that may lead to more serious liver problems, such as scarring of the liver (cirrhosis). What are the causes? Causes of fatty liver may include:  Drinking too much alcohol.  Poor nutrition.  Obesity.  Cushing syndrome.  Diabetes.  Hyperlipidemia.  Pregnancy.  Certain drugs.  Poisons.  Some viral infections.  What increases the risk? You may be more likely to develop fatty liver if you:  Abuse alcohol.  Are pregnant.  Are overweight.  Have diabetes.  Have hepatitis.  Have a high triglyceride level.  What are the signs or symptoms? Fatty liver often does not cause any symptoms. In cases where symptoms develop, they can include:  Fatigue.  Weakness.  Weight loss.  Confusion.  Abdominal pain.  Yellowing of your skin and the white parts of your eyes (jaundice).  Nausea and vomiting.  How is this diagnosed? Fatty liver may be diagnosed by:  Physical exam and medical history.  Blood tests.  Imaging tests, such as an ultrasound, CT scan, or MRI.  Liver biopsy. A small sample of liver tissue is removed using a needle. The sample is then looked at under a microscope.  How is this treated? Fatty liver is often caused by other health conditions. Treatment for fatty liver may involve medicines and lifestyle changes to manage conditions such as:  Alcoholism.  High cholesterol.  Diabetes.  Being overweight or obese.  Follow these instructions at home:  Eat a healthy diet as directed by your health care provider.  Exercise regularly. This can help you lose weight and control your cholesterol and diabetes. Talk to your health care provider about an exercise plan and which activities are best for you.  Do not drink alcohol.  Take medicines only as directed by your health care provider. Contact a  health care provider if: You have difficulty controlling your:  Blood sugar.  Cholesterol.  Alcohol consumption.  Get help right away if:  You have abdominal pain.  You have jaundice.  You have nausea and vomiting. This information is not intended to replace advice given to you by your health care provider. Make sure you discuss any questions you have with your health care provider. Document Released: 12/04/2005 Document Revised: 03/26/2016 Document Reviewed: 02/28/2014 Elsevier Interactive Patient Education  [employer].

## 2018-03-25 NOTE — Progress Notes (Addendum)
Chief Complaint  Patient presents with  . Follow-up   F/u  1. Reviewed labs Hep B core total + and pt reports in the arm he was given shot of HBIG disc with pt review with GI to see if this could be cause of HBIG vs could have been exposed to hep B in past and natural immunity. He also wants to know if he could pass this to his partner  2. Fatty liver reviewed today + [place] fatty liver he may need hep A vaccination as well in future will defer to GI upcoming appt  3. DM 2 controlled on metformin he has eye MD appt [facility] 05/2018  4. Platelets normalized as of now but disc if urology w/u for elevated PSA neg will rec hematology referral to w/u chronic thrombocytopenia he reports he at one time was taking NSAIDS and tylenol for left knee pain chronically but then had surgery. We reviewed medications which could cause low plts today.   5. Elevating PSA since 2016 1.6>>3.17>>3.52 appt with urology Dr. Junious Silk 04/21/18  6. HTN BP controlled on lis 2.5 mg qd and toprol xl 25 mg qd   Review of Systems  Constitutional: Negative for weight loss.  HENT: Negative for hearing loss.   Eyes: Negative for blurred vision.  Respiratory: Negative for shortness of breath.   Cardiovascular: Negative for chest pain.  Musculoskeletal: Negative for joint pain.  Skin: Negative for rash.  Neurological: Negative for headaches.  Psychiatric/Behavioral: Negative for depression and memory loss.   Past Medical History:  Diagnosis Date  . Coronary artery disease    a. 06/2015 Cardiac CT: Ca score 1103 (84th %'ile);  b. 07/2015 Cath: LM 70, LAD 80p, 100/23m, D1 70, D2 95, RI 75, RCA 100p/m;  c. 07/2015 CABG x 5 (LIMA->LAD, VG->Diag, VG->OM1->OM2, VG->OM3).  . Diabetes mellitus without complication ([place]) 11/6107  . Dyslipidemia   . Essential hypertension   . 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 Fuchs' corneal dystrophy 2016   sees Dr Maudie Mercury  . Fuchs' corneal dystrophy   . GERD (gastroesophageal  reflux disease)   . Heart attack ([place])    silent  . Heart disease    history of blood clot in left ventricle per pt   . History of radiation exposure    right vocal cord squamous cell cancer  . Impingement syndrome of right shoulder 10/2015   s/p steroid injection Dr Sabra Heck  . Ischemic cardiomyopathy    a. dilated, EF 35% improved to 45-50% (2015);  b. 07/2015 EF 25-35% by LV gram.  . 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 isolated episode, not on [place].  Marland Kitchen [place] thrombus of cardiac apex    a. 06/2014: LV; resolved with coumadin-->no residual on f/u echo, no longer on coumadin.  . Osteoarthritis    a. R-shoulder, L-knee [facility])  . Skin cancer    squamous and basal, sees derm regularly Dr. Kellie Moor   . 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 Thrombocytopenia ([place])   . Vitamin D deficiency    Past Surgical History:  Procedure Laterality Date  . BICEPS TENDON REPAIR Right 1993  . CARDIAC CATHETERIZATION N/A 07/05/2015   Procedure: Left Heart Cath and Coronary Angiography;  Surgeon: Minna Merritts, MD;  Location: [facility];  Service: Cardiovascular;  Laterality: N/A;  . CATARACT EXTRACTION Left 12/2016  with keratoplasty  . COLONOSCOPY  2007  . COLONOSCOPY WITH PROPOFOL N/A 12/02/2017   TA, SSA, rpt 3 yrs(Tahiliani, Varnita B, MD)  . CORONARY ARTERY BYPASS GRAFT N/A 07/29/2015   Procedure: CORONARY ARTERY BYPASS GRAFTING (CABG) x 5 (LIMA to LAD, SVG to DIAGONAL, SVG SEQUENTIALLY to OM1 and OM2, SVG to OM3) with Endoscopic Vein Havesting of GREATER SAPHENOUS VEIN from RIGHT THIGH and partial LOWER LEG ;  Surgeon: [name], MD;  Location: [facility];  Service: Open Heart Surgery;  Laterality: N/A;  . EYE SURGERY     b/l cataract and cornea replaced   . JOINT REPLACEMENT    . KNEE ARTHROSCOPY Left remote  . right biceps tendon     repair/re attachment   . SKIN CANCER EXCISION  10/2015   BCC - L ala  (pending MOHs) and L scapula (complete excision)  . TEE WITHOUT CARDIOVERSION N/A 07/29/2015   Procedure: TRANSESOPHAGEAL ECHOCARDIOGRAM (TEE);  Surgeon: [name], MD;  Location: [facility];  Service: Open Heart Surgery;  Laterality: N/A;  . TONSILLECTOMY  1949  . TOTAL KNEE ARTHROPLASTY Left 03/18/2016   cemented L TKR; Earnestine Leys, MD   Family History  Problem Relation Age of Onset  . CAD Father 107       MI  . Hypertension Father   . Hyperlipidemia Father   . Alcoholism Father   . Diabetes Father    Social History   Socioeconomic History  . Marital status: Divorced    Spouse name: Not on file  . Number of children: Not on file  . Years of education: Not on file  . Highest education level: Not on file  Occupational History  . Not on file  Social Needs  . Financial resource strain: Not on file  . Food insecurity:    Worry: Not on file    Inability: Not on file  . Transportation needs:    Medical: Not on file    Non-medical: Not on file  Tobacco Use  . Smoking status: Former Smoker    Packs/day: 0.50    Years: 10.00    Pack years: 5.00    Types: Cigarettes    Last attempt to quit: 11/02/1978    Years since quitting: 39.4  . Smokeless tobacco: Never Used  . Tobacco comment: former smoker [phone] 1 pk/week no FH lung cancer   Substance and Sexual Activity  . Alcohol use: Yes    Alcohol/week: 0.0 oz    Comment: beer/wine on weekends  . Drug use: No  . Sexual activity: Yes  Lifestyle  . Physical activity:    Days per week: Not on file    Minutes per session: Not on file  . Stress: Not on file  Relationships  . Social connections:    Talks on phone: Not on file    Gets together: Not on file    Attends religious service: Not on file    Active member of club or organization: Not on file    Attends meetings of clubs or organizations: Not on file    Relationship status: Not on file  . Intimate partner violence:    Fear of current or ex partner: Not on file     Emotionally abused: Not on file    Physically abused: Not on file    Forced sexual activity: Not on file  Other Topics Concern  . Not on file  Social History Narrative   Lives with fiancee for 81yrs Metta Clines)  Moved from [place] years ago in 2015 to this area    Divorced   Retired Corporate treasurer    Occupation Retired Nurse, mental health   Edu: 1 yr college   Activity: volunteers at [place] [employer]: good water, fruits/vegetables daily      Tested at risk for OSA in preop for CABG   No outpatient medications have been marked as taking for the 03/25/18 encounter (Office Visit) with [name], [name], MD.   No Known Allergies Recent Results (from the past 2160 hour(s))  Comprehensive metabolic panel     Status: None   Collection Time: 02/28/18  8:33 AM  Result Value Ref Range   Sodium 140 135 - 145 mEq/L   Potassium 4.2 3.5 - 5.1 mEq/L   Chloride 103 96 - 112 mEq/L   CO2 30 19 - 32 mEq/L   Glucose, Bld 99 70 - 99 mg/dL   BUN 17 6 - 23 mg/dL   Creatinine, Ser 1.03 0.40 - 1.50 mg/dL   Total Bilirubin 0.7 0.2 - 1.2 mg/dL   Alkaline Phosphatase 50 39 - 117 U/L   AST 22 0 - 37 U/L   ALT 19 0 - 53 U/L   Total Protein 7.3 6.0 - 8.3 g/dL   Albumin 4.6 3.5 - 5.2 g/dL   Calcium 9.4 8.4 - 10.5 mg/dL   GFR 74.92 >60.00 mL/min  PSA, Medicare     Status: None   Collection Time: 02/28/18  8:33 AM  Result Value Ref Range   PSA 3.52 0.10 - 4.00 ng/ml    Comment: Test performed using Access Hybritech PSA Assay, a parmagnetic partical, chemiluminecent immunoassay.  Lipid panel     Status: None   Collection Time: 02/28/18  8:33 AM  Result Value Ref Range   Cholesterol 127 0 - 200 mg/dL    Comment: ATP III Classification       Desirable:  < 200 mg/dL               Borderline High:  200 - 239 mg/dL          High:  > = 240 mg/dL   Triglycerides 90.0 0.0 - 149.0 mg/dL    Comment: Normal:  <150 mg/dLBorderline High:  150 - 199 mg/dL   HDL 50.20 >39.00 mg/dL   VLDL 18.0 0.0 - 40.0 mg/dL   LDL Cholesterol  59 0 - 99 mg/dL   Total CHOL/HDL Ratio 3     Comment:                Men          Women1/2 Average Risk     3.4          3.3Average Risk          5.0          4.42X Average Risk          9.6          7.13X Average Risk          15.0          11.0                       NonHDL 76.56     Comment: NOTE:  Non-HDL goal should be 30 mg/dL higher than patient's LDL goal (i.e. LDL goal of < 70 mg/dL, would have non-HDL goal of < 100 mg/dL)  Hemoglobin A1c     Status: Abnormal  Collection Time: 02/28/18  8:33 AM  Result Value Ref Range   Hgb A1c MFr Bld 7.0 (H) 4.6 - 6.5 %    Comment: Glycemic Control Guidelines for People with Diabetes:Non Diabetic:  <6%Goal of Therapy: <7%Additional Action Suggested:  >8%   TSH     Status: None   Collection Time: 02/28/18  8:33 AM  Result Value Ref Range   TSH 1.73 0.35 - 4.50 uIU/mL  Urinalysis, Routine w reflex microscopic     Status: None   Collection Time: 02/28/18  8:33 AM  Result Value Ref Range   Color, Urine YELLOW Yellow;Lt. Yellow   APPearance CLEAR Clear   Specific Gravity, Urine 1.025 1.000 - 1.030   pH 5.5 5.0 - 8.0   Total Protein, Urine NEGATIVE Negative   Urine Glucose NEGATIVE Negative   Ketones, ur NEGATIVE Negative   Bilirubin Urine NEGATIVE Negative   Hgb urine dipstick NEGATIVE Negative   Urobilinogen, UA 0.2 0.0 - 1.0   Leukocytes, UA NEGATIVE Negative   Nitrite NEGATIVE Negative   WBC, UA 0-2/hpf 0-2/hpf   RBC / HPF none seen 0-2/hpf   Squamous Epithelial / LPF Rare(0-4/hpf) Rare(0-4/hpf)  Urine Microalbumin w/creat. ratio     Status: None   Collection Time: 02/28/18  8:33 AM  Result Value Ref Range   Microalb, Ur 1.2 0.0 - 1.9 mg/dL   Creatinine,U 152.2 mg/dL   Microalb Creat Ratio 0.8 0.0 - 30.0 mg/g  Hepatitis B core antibody, total     Status: Abnormal   Collection Time: 02/28/18  8:33 AM  Result Value Ref Range   Hep B Core Total Ab REACTIVE (A) NON-REACTI  Hepatitis B Core Antibody, IgM     Status: None   Collection  Time: 02/28/18  8:33 AM  Result Value Ref Range   Hep B C IgM NON-REACTIVE NON-REACTI  Hepatitis A Ab, Total     Status: None   Collection Time: 02/28/18  8:33 AM  Result Value Ref Range   Hepatitis A AB,Total NON-REACTIVE NON-REACTI  Hepatitis A antibody, IgM     Status: None   Collection Time: 02/28/18  8:33 AM  Result Value Ref Range   Hep A IgM NON-REACTIVE NON-REACTI  Hepatitis B surface antigen     Status: None   Collection Time: 02/28/18  8:33 AM  Result Value Ref Range   Hepatitis B Surface Ag NON-REACTIVE NON-REACTI  Hepatitis B surface antibody     Status: None   Collection Time: 02/28/18  8:33 AM  Result Value Ref Range   Hepatitis B-Post 778 > OR = 10 mIU/mL    Comment: . Patient has immunity to hepatitis B virus. . For additional information, please refer to http://education.questdiagn[facility].com/faq/FAQ105 (This link is being provided for informational/ educational purposes only).   Hepatitis C antibody     Status: None   Collection Time: 02/28/18  8:33 AM  Result Value Ref Range   Hepatitis C Ab NON-REACTIVE NON-REACTI   SIGNAL TO CUT-OFF 0.05 <1.00    Comment: . HCV antibody was non-reactive. There is no laboratory  evidence of HCV infection. . In most cases, no further action is required. However, if recent HCV exposure is suspected, a test for HCV RNA (test code [id number] [id number]) is suggested. . For additional information please refer to http://education.quest[facility].com/faq/FAQ22v1 (This link is being provided for informational/ educational purposes only.) .   INR/PT     Status: None   Collection Time: 02/28/18  8:33 AM  Result Value Ref Range  INR 1.0 0.8 - 1.0 ratio   Prothrombin Time 11.8 9.6 - 13.1 sec  Pathologist smear review     Status: None   Collection Time: 02/28/18  8:33 AM  Result Value Ref Range   Path Review      Comment: Myeloid population consists predominantly of mature segmented neutrophils. A few lymphocytes appear reactive. No  immature cells are identified. RBC are unremarkable. Thrombocytopenia with some large platelets seen. No significant platelet clumping is seen. Reviewed by Francis Gaines Mammarappallil, MD  (Electronic Signature on File)     03/01/2018   CBC with Differential/Platelet     Status: Abnormal   Collection Time: 02/28/18  8:33 AM  Result Value Ref Range   WBC 6.5 3.8 - 10.8 Thousand/uL   RBC 4.47 4.20 - 5.80 Million/uL   Hemoglobin 14.6 13.2 - 17.1 g/dL   HCT 41.7 38.5 - 50.0 %   MCV 93.3 80.0 - 100.0 fL   MCH 32.7 27.0 - 33.0 pg   MCHC 35.0 32.0 - 36.0 g/dL   RDW 13.0 11.0 - 15.0 %   Platelets 143 140 - 400 Thousand/uL   MPV 13.7 (H) 7.5 - 12.5 fL   Neutro Abs 2,841 1,500 - 7,800 cells/uL   Lymphs Abs 2,763 850 - 3,900 cells/uL   WBC mixed population 637 200 - 950 cells/uL   Eosinophils Absolute 182 15 - 500 cells/uL   Basophils Absolute 78 0 - 200 cells/uL   Neutrophils Relative % 43.7 %   Total Lymphocyte 42.5 %   Monocytes Relative 9.8 %   Eosinophils Relative 2.8 %   Basophils Relative 1.2 %  EXTRA LAV TOP TUBE     Status: None   Collection Time: 02/28/18  8:33 AM  Result Value Ref Range   EXTRA LAVENDER-TOP TUBE      Comment: We received an extra specimen with no test requested. If any test is desired for this specimen please call client services and advise.   TEST AUTHORIZATION     Status: None   Collection Time: 02/28/18  8:33 AM  Result Value Ref Range   TEST NAME: HEPATITIS BE ANTIGEN HEPATITI    TEST CODE: [id number]    CLIENT CONTACT: LATOYA WRIGHT    REPORT ALWAYS MESSAGE SIGNATURE      Comment: . The laboratory testing on this patient was verbally requested or confirmed by the ordering physician or his or her authorized representative after contact with an employee of [employer]. Federal regulations require that we maintain on file written authorization for all laboratory testing.  Accordingly we are asking that the ordering physician or his or  her authorized representative sign a copy of this report and promptly return it to the client service representative. . . Signature:____________________________________________________ . Please fax this signed page to [phone] or return it via your [employer] courier.   Hepatitis B e antigen     Status: None   Collection Time: 02/28/18  8:33 AM  Result Value Ref Range   Hep B E Ag NON-REACTIVE NON-REACTI  Hepatitis B e antibody     Status: None   Collection Time: 02/28/18  8:33 AM  Result Value Ref Range   Hep B E Ab NON-REACTIVE NON-REACTI   Objective  Body mass index is 31.38 kg/m. Wt Readings from Last 3 Encounters:  03/25/18 231 lb 6.4 oz (105 kg)  02/14/18 231 lb 8 oz (105 kg)  02/10/18 237 lb 3.2 oz (107.6 kg)   Temp Readings  from Last 3 Encounters:  03/25/18 98.2 F (36.8 C) (Oral)  02/10/18 98.3 F (36.8 C) (Oral)  01/19/18 98.1 F (36.7 C) (Oral)   BP Readings from Last 3 Encounters:  03/25/18 122/76  02/14/18 134/70  02/10/18 122/62   Pulse Readings from Last 3 Encounters:  03/25/18 69  02/14/18 75  02/10/18 72    Physical Exam  Constitutional: He is oriented to person, place, and time. Vital signs are normal. He appears well-developed and well-nourished. He is cooperative.  HENT:  Head: Normocephalic and atraumatic.  Mouth/Throat: Oropharynx is clear and moist and mucous membranes are normal.  Eyes: Pupils are equal, round, and reactive to light. Conjunctivae are normal.  Cardiovascular: Normal rate, regular rhythm and normal heart sounds.  Pulmonary/Chest: Effort normal and breath sounds normal.  Neurological: He is alert and oriented to person, place, and time. Gait normal.  Skin: Skin is warm, dry and intact.  Psychiatric: He has a normal mood and affect. His speech is normal and behavior is normal. Judgment and thought content normal. Cognition and memory are normal.  Nursing note and vitals reviewed.   Assessment   1. Hep B core  total + with immunity hep B antibody 778 h/o HBIG per pt  2. Fatty liver  3. DM 2 controlled  4. Thrombocytopenia normalized for now but chronic since 2016 5. Elevating PSA  6. HTN controlled 7. C/w memory loss last visit MMSE score today 29/30 8. HM  Plan  1. And 2.  Disc today will have pt f/u with GI appt upcoming. GI please see HPI Also may need hep A vaccination due to fatty liver will def to GI  3.  Cont meds  Eye exam upcoming 05/2018 had prior eye exam 10/14/17 [facility] opthalmology Will do foot exam at f/u  4.  If #5 w/u neg disc rec hematology to further w/u  5. Dr. Junious Silk appt 04/21/18  6.  Cont meds  7.  Memory good monitor  8.  Had flu, prevnar, pna 23, Tdap, Zoster consider shingrix in future if has not had and hep A vaccination given fatty liver   Colonoscopy [facility] 12/02/17 rec repeat in 3 years sessile serrated and tubular  F/u dermatology Dr. Kellie Moor h/o [facility], SCC Former smoker 1 pk/week [phone] no FH lung cancer, no chew  Elevating PSA f/u urology   [facility] appt 04/19/18 BPH w/o LUTS f/u in 6 months Dr. Junious Silk with [facility]   Provider: Dr. Olivia Mackie [name]-Internal Medicine

## 2018-03-26 ENCOUNTER — Encounter: Payer: Self-pay | Admitting: Internal Medicine

## 2018-04-20 DIAGNOSIS — H3554 Dystrophies primarily involving the retinal pigment epithelium: Secondary | ICD-10-CM | POA: Insufficient documentation

## 2018-04-20 DIAGNOSIS — H547 Unspecified visual loss: Secondary | ICD-10-CM | POA: Insufficient documentation

## 2018-04-20 DIAGNOSIS — H53413 Scotoma involving central area, bilateral: Secondary | ICD-10-CM | POA: Insufficient documentation

## 2018-04-20 DIAGNOSIS — H3500 Unspecified background retinopathy: Secondary | ICD-10-CM | POA: Insufficient documentation

## 2018-04-20 DIAGNOSIS — H31013 Macula scars of posterior pole (postinflammatory) (post-traumatic), bilateral: Secondary | ICD-10-CM | POA: Insufficient documentation

## 2018-04-25 ENCOUNTER — Ambulatory Visit: Payer: Medicare Other | Admitting: Gastroenterology

## 2018-04-29 ENCOUNTER — Other Ambulatory Visit: Payer: Self-pay | Admitting: Family Medicine

## 2018-05-02 ENCOUNTER — Encounter: Payer: Self-pay | Admitting: Gastroenterology

## 2018-05-02 ENCOUNTER — Ambulatory Visit: Payer: Medicare Other | Admitting: Gastroenterology

## 2018-05-02 VITALS — BP 126/70 | HR 69 | Ht 72.0 in | Wt 230.0 lb

## 2018-05-02 DIAGNOSIS — B1910 Unspecified viral hepatitis B without hepatic coma: Secondary | ICD-10-CM

## 2018-05-02 NOTE — Progress Notes (Signed)
[name], MD [address]  [place], [place] [address]  Main: [phone]  Fax: [phone]   Primary Care Physician: [name], [name], MD  Primary Gastroenterologist:  Dr. [name]  Chief Complaint  Patient presents with  . Follow-up    per PCP, Hep B    HPI: Shannon Chung is a 77 y.o. male here for follow-up to discuss hepatitis B labs.  Denies any abdominal pain, jaundice, nausea or vomiting, weight loss, altered bowel habits, blood in stool, loss of appetite.  Initially seen in February 2019 due to positive Cologuard test, and has undergone colonoscopy for this which revealed 4 polyps that were removed, and showed tubular adenoma and sessile serrated adenoma, and repeat recommended in 3 years.  During that visit, blood work showed platelets to be mildly low at 129 in January 2019, and low previously 100-106, and cirrhosis work-up was ordered.  Patient reports 1 beer every weekend, but sometimes every 3 to 4 days.  No heavy alcohol use.  No herbal supplements or hepatotoxic drug use.  April 2019 labs show normal platelets of 143. INR is normal Hepatitis B labs show natural immunity Abdominal ultrasound showed fatty liver  Current Outpatient Medications  Medication Sig Dispense Refill  . acetaminophen (TYLENOL) 500 MG tablet Take 500 mg by mouth every 6 (six) hours as needed for mild pain.     Marland Kitchen aspirin (ASPIRIN EC) 81 MG EC tablet Take 162 mg by mouth daily. Swallow whole.     Marland Kitchen atorvastatin (LIPITOR) 40 MG tablet TAKE 1 TABLET (40 MG TOTAL) BY MOUTH DAILY. 90 tablet 3  . blood glucose meter kit and supplies KIT Dispense based on patient and insurance preference (one touch ultra mini silver). Use once daily and as needed E11.9 1 each 0  . Cholecalciferol (VITAMIN D) 2000 units CAPS Take 1 capsule (2,000 Units total) daily by mouth. 30 capsule   . fluticasone (FLONASE) 50 MCG/ACT nasal spray Place 1 spray into both nostrils as  needed for allergies. Reported on 03/18/2016    . glucose blood test strip Use to check once daily and as needed (onetouch) E11.9 100 each 3  . Lancets (ONETOUCH ULTRASOFT) lancets Use to check once daily and as needed E11.9 100 each 3  . lansoprazole (PREVACID) 30 MG capsule Take 1 capsule (30 mg total) by mouth every morning. 90 capsule 3  . lisinopril (PRINIVIL,ZESTRIL) 2.5 MG tablet TAKE 1 TABLET (2.5 MG TOTAL) BY MOUTH DAILY. 90 tablet 3  . metFORMIN (GLUCOPHAGE) 500 MG tablet Take 1 tablet (500 mg total) daily by mouth. 30 tablet 6  . metoprolol succinate (TOPROL-XL) 25 MG 24 hr tablet TAKE 1 TABLET (25 MG TOTAL) BY MOUTH DAILY. 90 tablet 3  . prednisoLONE acetate (PRED FORTE) 1 % ophthalmic suspension INSTILL 1 DROP IN SURGICAL EYE 4 TIMES A DAY  3  . ranitidine (ZANTAC) 150 MG tablet TAKE 1 TABLET (150 MG TOTAL) BY MOUTH AT BEDTIME. 180 tablet 1  . sildenafil (REVATIO) 20 MG tablet Take 2-4 tablets (40-80 mg total) by mouth daily as needed (relations). 30 tablet 3  . vitamin B-12 (CYANOCOBALAMIN) 500 MCG tablet Take 500 mcg by mouth daily. Reported on 03/17/2016     No current facility-administered medications for this visit.     Allergies as of 05/02/2018  . (No Known Allergies)    ROS:  General: Negative for anorexia, weight loss, fever, chills, fatigue, weakness. ENT: Negative for hoarseness, difficulty swallowing , nasal congestion.  CV: Negative for chest pain, angina, palpitations, dyspnea on exertion, peripheral edema.  Respiratory: Negative for dyspnea at rest, dyspnea on exertion, cough, sputum, wheezing.  GI: See history of present illness. GU:  Negative for dysuria, hematuria, urinary incontinence, urinary frequency, nocturnal urination.  Endo: Negative for unusual weight change.    Physical Examination:   BP 126/70   Pulse 69   Ht 6' (1.829 m)   Wt 230 lb (104.3 kg)   BMI 31.19 kg/m   General: Well-nourished, well-developed in no acute distress.  Eyes: No  icterus. Conjunctivae pink. Mouth: Oropharyngeal mucosa moist and pink , no lesions erythema or exudate. Neck: Supple, Trachea midline Abdomen: Bowel sounds are normal, nontender, nondistended, no hepatosplenomegaly or masses, no abdominal bruits or hernia , no rebound or guarding.   Extremities: No lower extremity edema. No clubbing or deformities. Neuro: Alert and oriented x 3.  Grossly intact. Skin: Warm and dry, no jaundice.   Psych: Alert and cooperative, normal mood and affect.   Labs: CMP     Component Value Date/Time   NA 140 02/28/2018 0833   NA 141 11/28/2014   NA 141 11/28/2014   K 4.2 02/28/2018 0833   CL 103 02/28/2018 0833   CO2 30 02/28/2018 0833   GLUCOSE 99 02/28/2018 0833   BUN 17 02/28/2018 0833   BUN 12 11/28/2014   CREATININE 1.03 02/28/2018 0833   CREATININE 0.85 11/28/2014   CREATININE 0.85 11/28/2014   CALCIUM 9.4 02/28/2018 0833   PROT 7.3 02/28/2018 0833   PROT 7.4 01/23/2016 0803   ALBUMIN 4.6 02/28/2018 0833   ALBUMIN 4.8 01/23/2016 0803   ALBUMIN 4.0 11/28/2014   AST 22 02/28/2018 0833   AST 21 11/28/2014   AST 21 11/28/2014   ALT 19 02/28/2018 0833   ALT 27 11/28/2014   ALT 27 11/28/2014   ALKPHOS 50 02/28/2018 0833   ALKPHOS 53 11/28/2014   ALKPHOS 53 11/28/2014   BILITOT 0.7 02/28/2018 0833   BILITOT 0.6 01/23/2016 0803   BILITOT 0.5 11/28/2014   BILITOT 0.5 11/28/2014   GFRNONAA >60 11/29/2017 1039   GFRAA >60 11/29/2017 1039   Lab Results  Component Value Date   WBC 6.5 02/28/2018   HGB 14.6 02/28/2018   HCT 41.7 02/28/2018   MCV 93.3 02/28/2018   PLT 143 02/28/2018    Imaging Studies: No results found.  Assessment and Plan:   Shannon Chung is a 77 y.o. y/o male with positive Cologuard, that led to colonoscopy, with polyps removed, and repeat recommended in 3 years, with blood work showing naturally immunity to hepatitis B  Hepatitis B labs show positive total HBcAb, and positive HBs antibody HBs antigen, HBc IgM  antibody, HBE antigen, HBE antibody are negative This is all consistent with natural immunity  Patient does not have any clinical signs of cirrhosis.   Ultrasound is also consistent with this Normal liver function tests, including normal INR, normal bilirubin, albumin are consistent with no cirrhosis as well  We will obtain HBV DNA to ensure it is undetectable If it is undetectable, patient can continue follow-up with primary care physician and follow-up with [place] as needed  Due to chronically low platelets, primary care physician can refer to hematology for evaluation of chronic thrombocytopenia.  Patient was educated on weight loss, diet, exercise, and avoiding alcohol and hepatotoxic drugs including herbal supplements and over-the-counter products to help reverse fatty liver and prevent cirrhosis   Dr [name]

## 2018-05-02 NOTE — Patient Instructions (Signed)
F/U as needed  Fatty Liver Fatty liver, also called hepatic steatosis or steatohepatitis, is a condition in which too much fat has built up in your liver cells. The liver removes harmful substances from your bloodstream. It produces fluids your body needs. It also helps your body use and store energy from the food you eat. In many cases, fatty liver does not cause symptoms or problems. It is often diagnosed when tests are being done for other reasons. However, over time, fatty liver can cause inflammation that may lead to more serious liver problems, such as scarring of the liver (cirrhosis). What are the causes? Causes of fatty liver may include:  Drinking too much alcohol.  Poor nutrition.  Obesity.  Cushing syndrome.  Diabetes.  Hyperlipidemia.  Pregnancy.  Certain drugs.  Poisons.  Some viral infections.  What increases the risk? You may be more likely to develop fatty liver if you:  Abuse alcohol.  Are pregnant.  Are overweight.  Have diabetes.  Have hepatitis.  Have a high triglyceride level.  What are the signs or symptoms? Fatty liver often does not cause any symptoms. In cases where symptoms develop, they can include:  Fatigue.  Weakness.  Weight loss.  Confusion.  Abdominal pain.  Yellowing of your skin and the white parts of your eyes (jaundice).  Nausea and vomiting.  How is this diagnosed? Fatty liver may be diagnosed by:  Physical exam and medical history.  Blood tests.  Imaging tests, such as an ultrasound, CT scan, or MRI.  Liver biopsy. A small sample of liver tissue is removed using a needle. The sample is then looked at under a microscope.  How is this treated? Fatty liver is often caused by other health conditions. Treatment for fatty liver may involve medicines and lifestyle changes to manage conditions such as:  Alcoholism.  High cholesterol.  Diabetes.  Being overweight or obese.  Follow these instructions at  home:  Eat a healthy diet as directed by your health care provider.  Exercise regularly. This can help you lose weight and control your cholesterol and diabetes. Talk to your health care provider about an exercise plan and which activities are best for you.  Do not drink alcohol.  Take medicines only as directed by your health care provider. Contact a health care provider if: You have difficulty controlling your:  Blood sugar.  Cholesterol.  Alcohol consumption.  Get help right away if:  You have abdominal pain.  You have jaundice.  You have nausea and vomiting. This information is not intended to replace advice given to you by your health care provider. Make sure you discuss any questions you have with your health care provider. Document Released: 12/04/2005 Document Revised: 03/26/2016 Document Reviewed: 02/28/2014 Elsevier Interactive Patient Education  [employer].

## 2018-05-09 ENCOUNTER — Encounter: Payer: Self-pay | Admitting: Gastroenterology

## 2018-05-12 ENCOUNTER — Other Ambulatory Visit
Admission: RE | Admit: 2018-05-12 | Discharge: 2018-05-12 | Disposition: A | Discharge status: Home / Self Care | Payer: Medicare Other | Source: Ambulatory Visit | Attending: Gastroenterology | Admitting: Gastroenterology

## 2018-05-12 DIAGNOSIS — K7460 Unspecified cirrhosis of liver: Secondary | ICD-10-CM | POA: Insufficient documentation

## 2018-05-12 DIAGNOSIS — D696 Thrombocytopenia, unspecified: Secondary | ICD-10-CM | POA: Diagnosis present

## 2018-05-12 DIAGNOSIS — B1910 Unspecified viral hepatitis B without hepatic coma: Secondary | ICD-10-CM | POA: Diagnosis present

## 2018-05-13 LAB — HEPATITIS B DNA, ULTRAQUANTITATIVE, PCR
HBV DNA SERPL PCR-ACNC: NOT DETECTED [IU]/mL
HBV DNA SERPL PCR-LOG IU: UNDETERMINED log10 IU/mL

## 2018-05-13 LAB — HCV COMMENT:

## 2018-05-13 LAB — HEPATITIS B SURFACE ANTIBODY,QUALITATIVE: Hep B S Ab: REACTIVE

## 2018-05-13 LAB — HEPATITIS C ANTIBODY (REFLEX)

## 2018-05-30 ENCOUNTER — Encounter: Payer: Self-pay | Admitting: Internal Medicine

## 2018-05-30 MED ORDER — METFORMIN HCL 500 MG PO TABS: 500.0000 mg | ORAL_TABLET | Freq: Every day | ORAL | 6 refills | Status: DC

## 2018-06-28 ENCOUNTER — Encounter: Payer: Self-pay | Admitting: Internal Medicine

## 2018-06-28 ENCOUNTER — Other Ambulatory Visit: Payer: Self-pay | Admitting: Internal Medicine

## 2018-06-28 ENCOUNTER — Other Ambulatory Visit: Payer: Self-pay | Admitting: Family Medicine

## 2018-06-28 DIAGNOSIS — N529 Male erectile dysfunction, unspecified: Secondary | ICD-10-CM

## 2018-06-28 MED ORDER — SILDENAFIL CITRATE 20 MG PO TABS: 40.0000 mg | ORAL_TABLET | Freq: Every day | ORAL | 11 refills | Status: DC | PRN

## 2018-06-28 NOTE — Telephone Encounter (Signed)
Denied refill. Pt no longer under Dr. Synthia Innocent care.

## 2018-07-20 ENCOUNTER — Encounter: Payer: Self-pay | Admitting: Internal Medicine

## 2018-07-28 ENCOUNTER — Encounter: Payer: Self-pay | Admitting: Internal Medicine

## 2018-08-05 ENCOUNTER — Encounter: Payer: Self-pay | Admitting: Internal Medicine

## 2018-08-05 ENCOUNTER — Ambulatory Visit: Payer: Medicare Other | Admitting: Internal Medicine

## 2018-08-05 VITALS — BP 122/78 | HR 72 | Temp 99.1°F | Ht 72.0 in | Wt 228.6 lb

## 2018-08-05 DIAGNOSIS — Z1159 Encounter for screening for other viral diseases: Secondary | ICD-10-CM | POA: Diagnosis not present

## 2018-08-05 DIAGNOSIS — N400 Enlarged prostate without lower urinary tract symptoms: Secondary | ICD-10-CM | POA: Insufficient documentation

## 2018-08-05 DIAGNOSIS — Z0184 Encounter for antibody response examination: Secondary | ICD-10-CM

## 2018-08-05 DIAGNOSIS — D696 Thrombocytopenia, unspecified: Secondary | ICD-10-CM

## 2018-08-05 DIAGNOSIS — K760 Fatty (change of) liver, not elsewhere classified: Secondary | ICD-10-CM

## 2018-08-05 DIAGNOSIS — E559 Vitamin D deficiency, unspecified: Secondary | ICD-10-CM

## 2018-08-05 DIAGNOSIS — E119 Type 2 diabetes mellitus without complications: Secondary | ICD-10-CM

## 2018-08-05 NOTE — Patient Instructions (Addendum)
shingrix due before 10/15/18 [facility] [place]  Have eye doctor fax notes to me  Consider hepatitis A vaccine    Fatty Liver Fatty liver, also called hepatic steatosis or steatohepatitis, is a condition in which too much fat has built up in your liver cells. The liver removes harmful substances from your bloodstream. It produces fluids your body needs. It also helps your body use and store energy from the food you eat. In many cases, fatty liver does not cause symptoms or problems. It is often diagnosed when tests are being done for other reasons. However, over time, fatty liver can cause inflammation that may lead to more serious liver problems, such as scarring of the liver (cirrhosis). What are the causes? Causes of fatty liver may include:  Drinking too much alcohol.  Poor nutrition.  Obesity.  Cushing syndrome.  Diabetes.  Hyperlipidemia.  Pregnancy.  Certain drugs.  Poisons.  Some viral infections.  What increases the risk? You may be more likely to develop fatty liver if you:  Abuse alcohol.  Are pregnant.  Are overweight.  Have diabetes.  Have hepatitis.  Have a high triglyceride level.  What are the signs or symptoms? Fatty liver often does not cause any symptoms. In cases where symptoms develop, they can include:  Fatigue.  Weakness.  Weight loss.  Confusion.  Abdominal pain.  Yellowing of your skin and the white parts of your eyes (jaundice).  Nausea and vomiting.  How is this diagnosed? Fatty liver may be diagnosed by:  Physical exam and medical history.  Blood tests.  Imaging tests, such as an ultrasound, CT scan, or MRI.  Liver biopsy. A small sample of liver tissue is removed using a needle. The sample is then looked at under a microscope.  How is this treated? Fatty liver is often caused by other health conditions. Treatment for fatty liver may involve medicines and lifestyle changes to manage conditions such  as:  Alcoholism.  High cholesterol.  Diabetes.  Being overweight or obese.  Follow these instructions at home:  Eat a healthy diet as directed by your health care provider.  Exercise regularly. This can help you lose weight and control your cholesterol and diabetes. Talk to your health care provider about an exercise plan and which activities are best for you.  Do not drink alcohol.  Take medicines only as directed by your health care provider. Contact a health care provider if: You have difficulty controlling your:  Blood sugar.  Cholesterol.  Alcohol consumption.  Get help right away if:  You have abdominal pain.  You have jaundice.  You have nausea and vomiting. This information is not intended to replace advice given to you by your health care provider. Make sure you discuss any questions you have with your health care provider. Document Released: 12/04/2005 Document Revised: 03/26/2016 Document Reviewed: 02/28/2014 Elsevier Interactive Patient Education  [employer].

## 2018-08-05 NOTE — Progress Notes (Addendum)
Chief Complaint  Patient presents with  . Follow-up   F/u  1. BPH will f/u Dr. Junious Silk 10/2017 PSA recheck  2. Vit D def on D3 2000 IU daily  3. DM 2 A1C 7.0 on metformin will repeat  4. 1/2 shingrix had made arm sore but better for now    Review of Systems  Constitutional: Negative for weight loss.  HENT: Negative for hearing loss.   Eyes: Negative for blurred vision.  Respiratory: Negative for shortness of breath.   Cardiovascular: Negative for chest pain.  Musculoskeletal: Negative for falls.  Skin: Negative for rash.       Left arm skin lesion    Neurological: Negative for headaches.  Psychiatric/Behavioral: Negative for depression and memory loss.   Past Medical History:  Diagnosis Date  . Coronary artery disease    a. 06/2015 Cardiac CT: Ca score 1103 (84th %'ile);  b. 07/2015 Cath: LM 70, LAD 80p, 100/62m D1 70, D2 95, RI 75, RCA 100p/m;  c. 07/2015 CABG x 5 (LIMA->LAD, VG->Diag, VG->OM1->OM2, VG->OM3).  . Diabetes mellitus without complication ([place] 98/5277 . Dyslipidemia   . Essential hypertension   . 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 Fuchs' corneal dystrophy 2016   sees Dr KMaudie Mercury . Fuchs' corneal dystrophy   . GERD (gastroesophageal reflux disease)   . Heart attack ([place]    silent  . Heart disease    history of blood clot in left ventricle per pt   . History of radiation exposure    right vocal cord squamous cell cancer  . Impingement syndrome of right shoulder 10/2015   s/p steroid injection Dr MSabra Heck . Ischemic cardiomyopathy    a. dilated, EF 35% improved to 45-50% (2015);  b. 07/2015 EF 25-35% by LV gram.  . [place] atrial fibrillation ([place] 1983   a. isolated episode, not on [place]  .Marland KitchenMural thrombus of cardiac apex    a. 06/2014: LV; resolved with coumadin-->no residual on f/u echo, no longer on coumadin.  . Osteoarthritis    a. R-shoulder, L-knee ([place]ab[place] Heck[place]o)  . Skin cancer    squamous and basal, sees derm regularly  Dr. IKellie Moor  . 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 Thrombocytopenia ([place]   . Vitamin D deficiency    Past Surgical History:  Procedure Laterality Date  . BICEPS TENDON REPAIR Right 1993  . CARDIAC CATHETERIZATION N/A 07/05/2015   Procedure: Left Heart Cath and Coronary Angiography;  Surgeon: TMinna Merritts MD;  [facility];  Service: Cardiovascular;  Laterality: N/A;  . CATARACT EXTRACTION Left 12/2016   with keratoplasty  . COLONOSCOPY  2007  . COLONOSCOPY WITH PROPOFOL N/A 12/02/2017   TA, SSA, rpt 3 yrs(Tahiliani, Varnita B, MD)  . CORONARY ARTERY BYPASS GRAFT N/A 07/29/2015   Procedure: CORONARY ARTERY BYPASS GRAFTING (CABG) x 5 (LIMA to LAD, SVG to DIAGONAL, SVG SEQUENTIALLY to OM1 and OM2, SVG to OM3) with Endoscopic Vein Havesting of GREATER SAPHENOUS VEIN from RIGHT THIGH and partial LOWER LEG ;  Surgeon: BGaye Pollack MD;  [facility];  Service: Open Heart Surgery;  Laterality: N/A;  . EYE SURGERY     b/l cataract and cornea replaced   . JOINT REPLACEMENT    . KNEE ARTHROSCOPY Left remote  . right biceps tendon     repair/re attachment   .  SKIN CANCER EXCISION  10/2015   BCC - L ala (pending MOHs) and L scapula (complete excision)  . TEE WITHOUT CARDIOVERSION N/A 07/29/2015   Procedure: TRANSESOPHAGEAL ECHOCARDIOGRAM (TEE);  Surgeon: [name], MD;  Location: [facility];  Service: Open Heart Surgery;  Laterality: N/A;  . TONSILLECTOMY  1949  . TOTAL KNEE ARTHROPLASTY Left 03/18/2016   cemented L TKR; Earnestine Leys, MD   Family History  Problem Relation Age of Onset  . CAD Father 29       MI  . Hypertension Father   . Hyperlipidemia Father   . Alcoholism Father   . Diabetes Father    Social History   Socioeconomic History  . Marital status: Divorced    Spouse name: Not on file  . Number of children: Not on file  . Years of education: Not on file  . Highest education level:  Not on file  Occupational History  . Not on file  Social Needs  . Financial resource strain: Not on file  . Food insecurity:    Worry: Not on file    Inability: Not on file  . Transportation needs:    Medical: Not on file    Non-medical: Not on file  Tobacco Use  . Smoking status: Former Smoker    Packs/day: 0.50    Years: 10.00    Pack years: 5.00    Types: Cigarettes    Last attempt to quit: 11/02/1978    Years since quitting: 39.7  . Smokeless tobacco: Never Used  . Tobacco comment: former smoker [phone] 1 pk/week no FH lung cancer   Substance and Sexual Activity  . Alcohol use: Yes    Alcohol/week: 0.0 standard drinks    Comment: beer/wine on weekends  . Drug use: No  . Sexual activity: Yes  Lifestyle  . Physical activity:    Days per week: Not on file    Minutes per session: Not on file  . Stress: Not on file  Relationships  . Social connections:    Talks on phone: Not on file    Gets together: Not on file    Attends religious service: Not on file    Active member of club or organization: Not on file    Attends meetings of clubs or organizations: Not on file    Relationship status: Not on file  . Intimate partner violence:    Fear of current or ex partner: Not on file    Emotionally abused: Not on file    Physically abused: Not on file    Forced sexual activity: Not on file  Other Topics Concern  . Not on file  Social History Narrative   Lives with fiancee for 67yr ([place]   [place]    Divorced   Retired [employer] treasurer   Occupation Retired ANurse, mental health  Edu: 1 yr college   Activity: volunteers at [place] [employer], fruits/vegetables daily      Tested at risk for OSA in preop for CABG   Current Meds  Medication Sig  . acetaminophen (TYLENOL) 500 MG tablet Take 500 mg by mouth every 6 (six) hours as needed for mild pain.   .Marland Kitchenaspirin (ASPIRIN EC) 81 MG EC tablet Take 162 mg by mouth daily. Swallow whole.   .Marland Kitchen atorvastatin (LIPITOR) 40 MG tablet TAKE 1 TABLET (40 MG TOTAL) BY MOUTH DAILY.  .Marland KitchenCholecalciferol (VITAMIN D) 2000 units CAPS  Take 1 capsule (2,000 Units total) daily by mouth.  . fluticasone (FLONASE) 50 MCG/ACT nasal spray Place 1 spray into both nostrils as needed for allergies. Reported on 03/18/2016  . lansoprazole (PREVACID) 30 MG capsule Take 1 capsule (30 mg total) by mouth every morning.  Marland Kitchen lisinopril (PRINIVIL,ZESTRIL) 2.5 MG tablet TAKE 1 TABLET (2.5 MG TOTAL) BY MOUTH DAILY.  . metFORMIN (GLUCOPHAGE) 500 MG tablet Take 1 tablet (500 mg total) by mouth daily.  . metoprolol succinate (TOPROL-XL) 25 MG 24 hr tablet TAKE 1 TABLET (25 MG TOTAL) BY MOUTH DAILY.  Marland Kitchen prednisoLONE acetate (PRED FORTE) 1 % ophthalmic suspension Place 1 drop into both eyes.   . ranitidine (ZANTAC) 150 MG tablet TAKE 1 TABLET (150 MG TOTAL) BY MOUTH AT BEDTIME.  . sildenafil (REVATIO) 20 MG tablet Take 2-4 tablets (40-80 mg total) by mouth daily as needed (relations).  . vitamin B-12 (CYANOCOBALAMIN) 500 MCG tablet Take 500 mcg by mouth daily. Reported on 03/17/2016  . [DISCONTINUED] blood glucose meter kit and supplies KIT Dispense based on patient and insurance preference (one touch ultra mini silver). Use once daily and as needed E11.9  . [DISCONTINUED] glucose blood test strip Use to check once daily and as needed (onetouch) E11.9  . [DISCONTINUED] Lancets (ONETOUCH ULTRASOFT) lancets Use to check once daily and as needed E11.9   No Known Allergies Recent Results (from the past 2160 hour(s))  Hepatitis B DNA, ultraquantitative, PCR     Status: None   Collection Time: 05/12/18  1:20 PM  Result Value Ref Range   HBV DNA SERPL PCR-ACNC HBV DNA not detected IU/mL   HBV DNA SERPL PCR-LOG IU UNABLE TO CALCULATE log10 IU/mL    Comment: (NOTE) Unable to calculate result since non-numeric result obtained for component test.    Test Info: Comment     Comment: (NOTE) The reportable range for this assay is 10 IU/mL  to 1 billion IU/mL. Performed At: [facility] [place], [place] [id number] Rush Farmer MD JH:[id number]   Hepatitis B surface antibody     Status: None   Collection Time: 05/12/18  1:20 PM  Result Value Ref Range   Hep B S Ab Reactive     Comment: (NOTE)              Non Reactive: Inconsistent with immunity,                            less than 10 mIU/mL              Reactive:     Consistent with immunity,                            greater than 9.9 mIU/mL Performed At: [facility] [place], [place] [id number] Rush Farmer MD [id number]   Hepatitis c antibody (reflex)     Status: None   Collection Time: 05/12/18  1:20 PM  Result Value Ref Range   HCV Ab <0.1 0.0 - 0.9 s/co ratio    Comment: (NOTE) Performed At: [facility] [place], [place] [id number] Rush Farmer MD VE:[id number]   HCV Comment:     Status: None   Collection Time: 05/12/18  1:20 PM  Result Value Ref Range   Comment: Comment     Comment: (NOTE) Non reactive HCV antibody screen is consistent with no HCV infection,  unless recent infection is suspected or other evidence exists to indicate HCV infection. Performed At: [facility] [place], [place] [id number] Rush Farmer MD IV:[id number]    Objective  Body mass index is 31 kg/m. Wt Readings from Last 3 Encounters:  08/05/18 228 lb 9.6 oz (103.7 kg)  05/02/18 230 lb (104.3 kg)  03/25/18 231 lb 6.4 oz (105 kg)   Temp Readings from Last 3 Encounters:  08/05/18 99.1 F (37.3 C) (Oral)  03/25/18 98.2 F (36.8 C) (Oral)  02/10/18 98.3 F (36.8 C) (Oral)   BP Readings from Last 3 Encounters:  08/05/18 122/78  05/02/18 126/70  03/25/18 122/76   Pulse Readings from Last 3 Encounters:  08/05/18 72  05/02/18 69  03/25/18 69    Physical Exam  Constitutional: He is oriented to person, place, and time. Vital signs are normal. He appears  well-developed and well-nourished. He is cooperative.  HENT:  Head: Normocephalic and atraumatic.  Mouth/Throat: Oropharynx is clear and moist and mucous membranes are normal.  Eyes: Pupils are equal, round, and reactive to light. Conjunctivae are normal.  Cardiovascular: Normal rate, regular rhythm and normal heart sounds.  Pulmonary/Chest: Effort normal and breath sounds normal.  Neurological: He is alert and oriented to person, place, and time. Gait normal.  Skin: Skin is warm, dry and intact.  Psychiatric: He has a normal mood and affect. His speech is normal and behavior is normal. Judgment and thought content normal. Cognition and memory are normal.  Nursing note and vitals reviewed.   Assessment   1. BPH 2. Vitamin D def  3. DM2  4. Fatty liver  5. HM Plan   1. F/u urology 10/2018  2. Check vitamin D upcoming on 2000 IU qd  3. Check a1C upcoming  On statin and ACEI Cont metformin  Upcoming eye exam 11/12 2019 Do foot exam at f/u  4. Check hep B status  rec hep A for now  5.  Had flu, prevnar, pna 23, Tdap, Zoster,  Had 1/2 shingrix 2nd due 10/15/18  sch labs 09/2018   Colonoscopy [facility] 12/02/17 rec repeat in 3 years sessile serrated and tubular  F/u dermatology Dr. Kellie Moor h/o [facility], [facility] appt 11 or 10/2018  Former smoker 1 pk/week 1967-1980 no FH lung cancer, no chew   If plts continue to be low will rec hematology in future   [facility] seen 11/07/2018 Dr. Junious Silk BPH with LUTS stable PSA in 6 months PSA was 3.07   Provider: Dr. Olivia Mackie [name]

## 2018-08-05 NOTE — Progress Notes (Signed)
Pre visit review using our clinic review tool, if applicable. No additional management support is needed unless otherwise documented below in the visit note. 

## 2018-09-02 ENCOUNTER — Other Ambulatory Visit (INDEPENDENT_AMBULATORY_CARE_PROVIDER_SITE_OTHER): Payer: Medicare Other

## 2018-09-02 DIAGNOSIS — Z0184 Encounter for antibody response examination: Secondary | ICD-10-CM

## 2018-09-02 DIAGNOSIS — E559 Vitamin D deficiency, unspecified: Secondary | ICD-10-CM

## 2018-09-02 DIAGNOSIS — D696 Thrombocytopenia, unspecified: Secondary | ICD-10-CM | POA: Diagnosis not present

## 2018-09-02 DIAGNOSIS — E119 Type 2 diabetes mellitus without complications: Secondary | ICD-10-CM

## 2018-09-02 DIAGNOSIS — K760 Fatty (change of) liver, not elsewhere classified: Secondary | ICD-10-CM

## 2018-09-02 DIAGNOSIS — Z1159 Encounter for screening for other viral diseases: Secondary | ICD-10-CM

## 2018-09-02 NOTE — Addendum Note (Signed)
Addended by: Arby Barrette on: 09/02/2018 09:48 AM   Modules accepted: Orders

## 2018-09-05 LAB — COMPREHENSIVE METABOLIC PANEL
AG RATIO: 1.6 (calc) (ref 1.0–2.5)
ALT: 15 U/L (ref 9–46)
AST: 19 U/L (ref 10–35)
Albumin: 4.6 g/dL (ref 3.6–5.1)
Alkaline phosphatase (APISO): 56 U/L (ref 40–115)
BUN: 16 mg/dL (ref 7–25)
CHLORIDE: 104 mmol/L (ref 98–110)
CO2: 21 mmol/L (ref 20–32)
Calcium: 9.9 mg/dL (ref 8.6–10.3)
Creat: 1.06 mg/dL (ref 0.70–1.18)
GLOBULIN: 2.9 g/dL (ref 1.9–3.7)
Glucose, Bld: 132 mg/dL — ABNORMAL HIGH (ref 65–99)
Potassium: 4.6 mmol/L (ref 3.5–5.3)
SODIUM: 142 mmol/L (ref 135–146)
TOTAL PROTEIN: 7.5 g/dL (ref 6.1–8.1)
Total Bilirubin: 0.6 mg/dL (ref 0.2–1.2)

## 2018-09-05 LAB — CBC WITH DIFFERENTIAL/PLATELET
Basophils Absolute: 50 cells/uL (ref 0–200)
Basophils Relative: 0.7 %
EOS PCT: 2.4 %
Eosinophils Absolute: 173 cells/uL (ref 15–500)
HCT: 43.6 % (ref 38.5–50.0)
Hemoglobin: 14.9 g/dL (ref 13.2–17.1)
Lymphs Abs: 3103 cells/uL (ref 850–3900)
MCH: 32.3 pg (ref 27.0–33.0)
MCHC: 34.2 g/dL (ref 32.0–36.0)
MCV: 94.6 fL (ref 80.0–100.0)
MONOS PCT: 9 %
MPV: 13.3 fL — ABNORMAL HIGH (ref 7.5–12.5)
NEUTROS PCT: 44.8 %
Neutro Abs: 3226 cells/uL (ref 1500–7800)
PLATELETS: 152 10*3/uL (ref 140–400)
RBC: 4.61 10*6/uL (ref 4.20–5.80)
RDW: 12.7 % (ref 11.0–15.0)
Total Lymphocyte: 43.1 %
WBC mixed population: 648 cells/uL (ref 200–950)
WBC: 7.2 10*3/uL (ref 3.8–10.8)

## 2018-09-05 LAB — VITAMIN D 25 HYDROXY (VIT D DEFICIENCY, FRACTURES): Vit D, 25-Hydroxy: 32 ng/mL (ref 30–100)

## 2018-09-05 LAB — MEASLES/MUMPS/RUBELLA IMMUNITY
Mumps IgG: >300 AU/mL
RUBELLA: 24.9 {index}
Rubeola IgG: 107 AU/mL

## 2018-09-05 LAB — HEMOGLOBIN A1C
EAG (MMOL/L): 8.1 (calc)
Hgb A1c MFr Bld: 6.7 % of total Hgb — ABNORMAL HIGH (ref <5.7)
Mean Plasma Glucose: 146 (calc)

## 2018-09-05 LAB — HEPATITIS B SURFACE ANTIBODY, QUANTITATIVE: Hepatitis B-Post: 723 m[IU]/mL (ref >=10)

## 2018-09-09 ENCOUNTER — Encounter: Payer: Self-pay | Admitting: Internal Medicine

## 2018-11-24 ENCOUNTER — Other Ambulatory Visit: Payer: Self-pay

## 2018-11-24 ENCOUNTER — Telehealth: Payer: Self-pay | Admitting: Internal Medicine

## 2018-11-24 MED ORDER — METFORMIN HCL 500 MG PO TABS: 500.0000 mg | ORAL_TABLET | Freq: Every day | ORAL | 6 refills | Status: DC

## 2018-11-24 NOTE — Telephone Encounter (Signed)
I spoke with patient & notified him that [facility].

## 2018-11-24 NOTE — Telephone Encounter (Signed)
Copied from [place] [id number]. Topic: Quick Communication - Rx Refill/Question >> Nov 24, 2018 10:17 AM Andria Frames L wrote: Medication: metFORMIN (GLUCOPHAGE) 500 MG tablet [[id number]] can this be refilled until next appointment?   Has the patient contacted their pharmacy?yes Preferred Pharmacy (with phone number or street name): [id number] [place], [place] - [address] [phone] (Phone) [phone] (Fax)   Agent: Please be advised that RX refills may take up to 3 business days. We ask that you follow-up with your pharmacy.

## 2019-01-27 ENCOUNTER — Encounter: Payer: Self-pay | Admitting: Internal Medicine

## 2019-01-31 ENCOUNTER — Telehealth: Payer: Self-pay

## 2019-01-31 NOTE — Telephone Encounter (Signed)
Virtual Visit Pre-Appointment Phone Call  Steps For Call:  1. Confirm consent - "In the setting of the current Covid19 crisis, you are scheduled for a VIDEO visit with your provider on 02/03/2019 at 10:00.  Just as we do with many in-office visits, in order for you to participate in this visit, we must obtain consent.  If you'd like, I can send this to your mychart (if signed up) or email for you to review.  Otherwise, I can obtain your verbal consent now.  All virtual visits are billed to your insurance company just like a normal visit would be.  By agreeing to a virtual visit, we'd like you to understand that the technology does not allow for your provider to perform an examination, and thus may limit your provider's ability to fully assess your condition.  Finally, though the technology is pretty good, we cannot assure that it will always work on either your or our end, and in the setting of a video visit, we may have to convert it to a phone-only visit.  In either situation, we cannot ensure that we have a secure connection.  Are you willing to proceed?"  2. Give patient instructions for WebEx download to smartphone as below if video visit  3. Advise patient to be prepared with any vital sign or heart rhythm information, their current medicines, and a piece of paper and pen handy for any instructions they may receive the day of their visit  4. Inform patient they will receive a phone call 15 minutes prior to their appointment time (may be from unknown caller ID) so they should be prepared to answer  5. Confirm that appointment type is correct in Epic appointment notes (video vs telephone)    TELEPHONE CALL NOTE  Kavon Valenza has been deemed a candidate for a follow-up tele-health visit to limit community exposure during the Covid-19 pandemic. I spoke with the patient via phone to ensure availability of phone/video source, confirm preferred email & phone number, and discuss instructions  and expectations.  I reminded Hashir Deleeuw to be prepared with any vital sign and/or heart rhythm information that could potentially be obtained via home monitoring, at the time of his visit. I reminded Treyon Wymore to expect a phone call at the time of his visit if his visit.  Did the patient verbally acknowledge consent to treatment? [place], [place] 01/31/2019 12:28 PM   CONSENT FOR TELE-HEALTH VISIT - PLEASE REVIEW  I hereby voluntarily request, consent and authorize [facility] and its employed or contracted physicians, physician assistants, nurse practitioners or other licensed health care professionals (the Practitioner), to provide me with telemedicine health care services (the Services") as deemed necessary by the treating Practitioner. I acknowledge and consent to receive the Services by the Practitioner via telemedicine. I understand that the telemedicine visit will involve communicating with the Practitioner through live audiovisual communication technology and the disclosure of certain medical information by electronic transmission. I acknowledge that I have been given the opportunity to request an in-person assessment or other available alternative prior to the telemedicine visit and am voluntarily participating in the telemedicine visit.  I understand that I have the right to withhold or withdraw my consent to the use of telemedicine in the course of my care at any time, without affecting my right to future care or treatment, and that the Practitioner or I may terminate the telemedicine visit at any time. I understand that I have the right  to inspect all information obtained and/or recorded in the course of the telemedicine visit and may receive copies of available information for a reasonable fee.  I understand that some of the potential risks of receiving the Services via telemedicine include:   Delay or interruption in medical evaluation due to technological  equipment failure or disruption;  Information transmitted may not be sufficient (e.g. poor resolution of images) to allow for appropriate medical decision making by the Practitioner; and/or   In rare instances, security protocols could fail, causing a breach of personal health information.  Furthermore, I acknowledge that it is my responsibility to provide information about my medical history, conditions and care that is complete and accurate to the best of my ability. I acknowledge that Practitioner's advice, recommendations, and/or decision may be based on factors not within their control, such as incomplete or inaccurate data provided by me or distortions of diagnostic images or specimens that may result from electronic transmissions. I understand that the practice of medicine is not an exact science and that Practitioner makes no warranties or guarantees regarding treatment outcomes. I acknowledge that I will receive a copy of this consent concurrently upon execution via email to the email address I last provided but may also request a printed copy by calling the office of [facility].    I understand that my insurance will be billed for this visit.   I have read or had this consent read to me.  I understand the contents of this consent, which adequately explains the benefits and risks of the Services being provided via telemedicine.   I have been provided ample opportunity to ask questions regarding this consent and the Services and have had my questions answered to my satisfaction.  I give my informed consent for the services to be provided through the use of telemedicine in my medical care  By participating in this telemedicine visit I agree to the above.

## 2019-02-02 NOTE — Progress Notes (Addendum)
Virtual Visit via Telephone Note   This visit type was conducted due to national recommendations for restrictions regarding the COVID-19 Pandemic (e.g. social distancing) in an effort to limit this patient's exposure and mitigate transmission in our community.  Due to her co-morbid illnesses, this patient is at least at moderate risk for complications without adequate follow up.  This format is felt to be most appropriate for this patient at this time.  The patient did not have access to video technology/had technical difficulties with video requiring transitioning to audio format only (telephone).  All issues noted in this document were discussed and addressed.  No physical exam could be performed with this format.  Please refer to the patient's chart for her  consent to telehealth for [facility].    Date:  02/02/2019   ID:  Shannon Chung, DOB Feb 23, 1941, MRN [id number]  Patient Location:  [place] [address]   Provider location:   [facility], [place] office  PCP:  [name], [name], MD  Cardiologist:  Patsy Baltimore  Chief Complaint:  CAD, CABG, allergies/sinus congestion  WEBcam visit  History of Present Illness:    Shannon Chung is a 78 y.o. male who presents via audio/video conferencing for a telehealth visit today.   The patient does not symptoms concerning for COVID-19 infection (fever, chills, cough, or new SHORTNESS OF BREATH).   Patient has a past medical history of  coronary artery disease,  severe multivessel disease, with CABG at [facility] August 2016,  ischemic cardiomyopathy,  initial ejection fraction in July 2015 of 35%, up to 55% after CABG  cardiac PET scan showing scar in the apical and periapical region,  mural thrombus seen in July 2015, started on anticoagulation,   hyperlipidemia,  who presents for routine follow-up of his coronary artery disease    irregular beats on auscultation last year with PMD PVCs on  EKG last visit  Reports he has been measuring his pulse and rhythm at home HR 59, regular No sx from PVCs  In general reports he is doing well with no complaints,  Still active, limited by allergies Denies any chest pain concerning for angina Before the virus, Active at the gym goes on a regular basis  Lab work reviewed with him in detail HBA1C 6.7 Total 127 LDL 59  Weight 221, height 6 feet Last year 231 10 pound weight loss in the past year from watching his diet and exercise   Prior CV studies:   The following studies were reviewed today:  Viability study showed scar in the periapical region, no hibernating myocardium, no ischemia, ejection fraction estimated at 45%  Repeat echocardiogram November 2015 showing ejection fraction up to 45%, report suggesting no residual thrombus   Past Medical History:  Diagnosis Date   Coronary artery disease    a. 06/2015 Cardiac CT: Ca score 1103 (84th %'ile);  b. 07/2015 Cath: LM 70, LAD 80p, 100/36m, D1 70, D2 95, RI 75, RCA 100p/m;  c. 07/2015 CABG x 5 (LIMA->LAD, VG->Diag, VG->OM1->OM2, VG->OM3).   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 dilated, EF 35% improved to 45-50% (2015);  b. 07/2015 EF 25-35% by LV gram.   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 isolated episode, not on [place].   Mural thrombus of cardiac apex    a. 06/2014: LV; resolved with coumadin-->no residual on f/u echo, no longer  on coumadin.   Osteoarthritis    a. R-shoulder, L-knee ([facility])   Skin cancer    squamous and basal, sees derm regularly Dr. 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   XRT; right vocal cord; had f/u until 2013    Thrombocytopenia ([place])    Vitamin D deficiency    Past Surgical History:  Procedure Laterality Date   BICEPS TENDON REPAIR Right 1993   CARDIAC CATHETERIZATION N/A 07/05/2015   Procedure: Left Heart Cath and Coronary Angiography;  Surgeon: Minna Merritts, MD;  Location: [place] CV LAB;  Service: Cardiovascular;  Laterality: N/A;   CATARACT EXTRACTION Left 12/2016   with keratoplasty   COLONOSCOPY  2007   COLONOSCOPY WITH PROPOFOL N/A 12/02/2017   TA, SSA, rpt 3 yrs(Tahiliani, Varnita B, MD)   CORONARY ARTERY BYPASS GRAFT N/A 07/29/2015   Procedure: CORONARY ARTERY BYPASS GRAFTING (CABG) x 5 (LIMA to LAD, SVG to DIAGONAL, SVG SEQUENTIALLY to OM1 and OM2, SVG to OM3) with Endoscopic Vein Havesting of GREATER SAPHENOUS VEIN from RIGHT THIGH and partial LOWER LEG ;  Surgeon: [name], MD;  Location: [place];  Service: Open Heart Surgery;  Laterality: N/A;   EYE SURGERY     b/l cataract and cornea replaced    JOINT REPLACEMENT     KNEE ARTHROSCOPY Left remote   right biceps tendon     repair/re attachment    SKIN CANCER EXCISION  10/2015   BCC - L ala (pending MOHs) and L scapula (complete excision)   TEE WITHOUT CARDIOVERSION N/A 07/29/2015   Procedure: TRANSESOPHAGEAL ECHOCARDIOGRAM (TEE);  Surgeon: [name], MD;  Location: [place];  Service: Open Heart Surgery;  Laterality: N/A;   TONSILLECTOMY  1949   TOTAL KNEE ARTHROPLASTY Left 03/18/2016   cemented L TKR; Earnestine Leys, MD     No outpatient medications have been marked as taking for the 02/03/19 encounter (Appointment) with Minna Merritts, MD.     Allergies:   Patient has no known allergies.   Social History   Tobacco Use   Smoking status: Former  Smoker    Packs/day: 0.50    Years: 10.00    Pack years: 5.00    Types: Cigarettes    Last attempt to quit: 11/02/1978    Years since quitting: 40.2   Smokeless tobacco: Never Used   Tobacco comment: former smoker 1967-1980 1 pk/week no FH lung cancer   Substance Use Topics   Alcohol use: Yes    Alcohol/week: 0.0 standard drinks    Comment: beer/wine on weekends   Drug use: No     Current Outpatient Medications on File Prior to Visit  Medication Sig Dispense Refill   acetaminophen (TYLENOL) 500 MG tablet Take 500 mg by mouth every 6 (six) hours as needed for mild pain.      aspirin (ASPIRIN EC) 81 MG EC tablet Take 162  mg by mouth daily. Swallow whole.      atorvastatin (LIPITOR) 40 MG tablet TAKE 1 TABLET (40 MG TOTAL) BY MOUTH DAILY. 90 tablet 3   Cholecalciferol (VITAMIN D) 2000 units CAPS Take 1 capsule (2,000 Units total) daily by mouth. 30 capsule    fluticasone (FLONASE) 50 MCG/ACT nasal spray Place 1 spray into both nostrils as needed for allergies. Reported on 03/18/2016     lansoprazole (PREVACID) 30 MG capsule Take 1 capsule (30 mg total) by mouth every morning. 90 capsule 3   lisinopril (PRINIVIL,ZESTRIL) 2.5 MG tablet TAKE 1 TABLET (2.5 MG TOTAL) BY MOUTH DAILY. 90 tablet 3   metFORMIN (GLUCOPHAGE) 500 MG tablet Take 1 tablet (500 mg total) by mouth daily. 30 tablet 6   metoprolol succinate (TOPROL-XL) 25 MG 24 hr tablet TAKE 1 TABLET (25 MG TOTAL) BY MOUTH DAILY. 90 tablet 3   prednisoLONE acetate (PRED FORTE) 1 % ophthalmic suspension Place 1 drop into both eyes.   3   ranitidine (ZANTAC) 150 MG tablet TAKE 1 TABLET (150 MG TOTAL) BY MOUTH AT BEDTIME. 180 tablet 1   sildenafil (REVATIO) 20 MG tablet Take 2-4 tablets (40-80 mg total) by mouth daily as needed (relations). 30 tablet 11   vitamin B-12 (CYANOCOBALAMIN) 500 MCG tablet Take 500 mcg by mouth daily. Reported on 03/17/2016     No current facility-administered medications on file prior to visit.        Family Hx: The patient's family history includes Alcoholism in his father; CAD (age of onset: 13) in his father; Diabetes in his father; Hyperlipidemia in his father; Hypertension in his father.  ROS:   Please see the history of present illness.    Review of Systems  Constitutional: Negative.   HENT: Positive for congestion.   Respiratory: Negative.   Cardiovascular: Negative.   Gastrointestinal: Negative.   Musculoskeletal: Negative.   Neurological: Negative.   Psychiatric/Behavioral: Negative.   All other systems reviewed and are negative.     Labs/Other Tests and Data Reviewed:    Recent Labs: 02/28/2018: TSH 1.73 09/02/2018: ALT 15; BUN 16; Creat 1.06; Hemoglobin 14.9; Platelets 152; Potassium 4.6; Sodium 142   Recent Lipid Panel Lab Results  Component Value Date/Time   CHOL 127 02/28/2018 08:33 AM   CHOL 123 01/23/2016 08:03 AM   CHOL 155 11/28/2014   CHOL 155 11/28/2014   TRIG 90.0 02/28/2018 08:33 AM   TRIG 126 11/28/2014   TRIG 126 11/28/2014   HDL 50.20 02/28/2018 08:33 AM   HDL 49 01/23/2016 08:03 AM   HDL 48 11/28/2014   CHOLHDL 3 02/28/2018 08:33 AM   LDLCALC 59 02/28/2018 08:33 AM   LDLCALC 51 01/23/2016 08:03 AM   LDLCALC 82 11/28/2014   LDLCALC 82 11/28/2014    Wt Readings from Last 3 Encounters:  08/05/18 228 lb 9.6 oz (103.7 kg)  05/02/18 230 lb (104.3 kg)  03/25/18 231 lb 6.4 oz (105 kg)     Exam:    Vital Signs: Vital signs as detailed above in HPI  Well nourished, well developed male in no acute distress. Constitutional:  oriented to person, place, and time. No distress.  Head: Normocephalic and atraumatic.  Eyes:  no discharge. No scleral icterus.  Neck: Normal range of motion. Neck supple.  Pulmonary/Chest: No audible wheezing, no distress, appears comfortable Musculoskeletal: Normal range of motion.  no  tenderness or deformity.  Neurological:   Coordination normal. Full exam not performed Skin:  No rash Psychiatric:  normal mood  and affect. behavior is normal. Thought content normal.    ASSESSMENT & PLAN:     Coronary artery disease of native artery of native heart with stable angina pectoris (HCC) Currently with no symptoms of angina. No further workup at this time. Continue current medication regimen.  Stable  Ischemic cardiomyopathy Euvolemic, no signs of heart failure Ejection fraction made good recovery following bypass surgery  Hx of CABG Details discussed with him, 4 years ago Has made a good recovery with no complications  Essential hypertension Blood pressure is well controlled on today's visit. No changes made to the medications.  Mixed hyperlipidemia Cholesterol is at goal on the current lipid regimen. No changes to the medications were made.  Stable    COVID-19 Education: The signs and symptoms of COVID-19 were discussed with the patient and how to seek care for testing (follow up with PCP or arrange E-visit).  The importance of social distancing was discussed today.  Patient Risk:   After full review of this patients clinical status, I feel that they are at least moderate risk at this time.  Time:   Today, I have spent 25 minutes with the patient with telehealth technology discussing diabetes numbers, cholesterol numbers, weight loss, previous bypass details, need to continue exercise program.     Medication Adjustments/Labs and Tests Ordered: Current medicines are reviewed at length with the patient today.  Concerns regarding medicines are outlined above.   Tests Ordered: No tests ordered   Medication Changes: No changes made   Disposition: Follow-up in 6 months   Signed, Ida Rogue, MD  02/02/2019 10:14 PM    [place] Office [address] [place] [id number], [place], [place] [address]

## 2019-02-03 ENCOUNTER — Telehealth: Payer: Self-pay

## 2019-02-03 ENCOUNTER — Other Ambulatory Visit: Payer: Self-pay

## 2019-02-03 ENCOUNTER — Telehealth (INDEPENDENT_AMBULATORY_CARE_PROVIDER_SITE_OTHER): Payer: Medicare Other | Admitting: Cardiovascular Disease

## 2019-02-03 DIAGNOSIS — Z951 Presence of aortocoronary bypass graft: Secondary | ICD-10-CM | POA: Diagnosis not present

## 2019-02-03 DIAGNOSIS — I255 Ischemic cardiomyopathy: Secondary | ICD-10-CM | POA: Diagnosis not present

## 2019-02-03 DIAGNOSIS — I10 Essential (primary) hypertension: Secondary | ICD-10-CM

## 2019-02-03 DIAGNOSIS — E782 Mixed hyperlipidemia: Secondary | ICD-10-CM

## 2019-02-03 DIAGNOSIS — I25118 Atherosclerotic heart disease of native coronary artery with other forms of angina pectoris: Secondary | ICD-10-CM

## 2019-02-03 NOTE — Patient Instructions (Signed)

## 2019-02-03 NOTE — Telephone Encounter (Signed)
LMOV for patient to call back.  Was trying to get in touch with patient before his telephone call with Dr. Rockey Situ to go over medications and get any vitals he may have.

## 2019-02-04 ENCOUNTER — Other Ambulatory Visit: Payer: Self-pay | Admitting: Cardiovascular Disease

## 2019-02-07 ENCOUNTER — Ambulatory Visit: Payer: Medicare Other | Admitting: Internal Medicine

## 2019-02-18 ENCOUNTER — Encounter: Payer: Self-pay | Admitting: Internal Medicine

## 2019-02-20 ENCOUNTER — Telehealth: Payer: Self-pay | Admitting: Cardiovascular Disease

## 2019-02-20 ENCOUNTER — Other Ambulatory Visit: Payer: Self-pay

## 2019-02-20 MED ORDER — LISINOPRIL 2.5 MG PO TABS: 2.5000 mg | ORAL_TABLET | Freq: Every day | ORAL | 3 refills | Status: DC

## 2019-02-20 NOTE — Telephone Encounter (Signed)
lisinopril (ZESTRIL) 2.5 MG tablet 90 tablet 3 02/20/2019    Sig - Route: Take 1 tablet (2.5 mg total) by mouth daily. - Oral   Sent to pharmacy as: lisinopril (ZESTRIL) 2.5 MG tablet   E-Prescribing Status: Receipt confirmed by pharmacy (02/20/2019 3:39 PM EDT)   Pharmacy   [name], [place] - [address]

## 2019-02-20 NOTE — Telephone Encounter (Signed)
°*  STAT* If patient is at the pharmacy, call can be transferred to refill team.   1. Which medications need to be refilled? (please list name of each medication and dose if known) lisinopril 2.5 mg po q d   2. Which pharmacy/location (including street and city if local pharmacy) is medication to be sent to? [facility]   3. Do they need a 30 day or 90 day supply? [place]

## 2019-02-21 ENCOUNTER — Ambulatory Visit: Payer: Medicare Other | Admitting: Cardiovascular Disease

## 2019-03-09 ENCOUNTER — Encounter: Payer: Self-pay | Admitting: Internal Medicine

## 2019-03-24 ENCOUNTER — Other Ambulatory Visit: Payer: Self-pay | Admitting: *Deleted

## 2019-03-24 MED ORDER — ATORVASTATIN CALCIUM 40 MG PO TABS: 40.0000 mg | ORAL_TABLET | Freq: Every day | ORAL | 3 refills | Status: DC

## 2019-04-14 ENCOUNTER — Encounter: Payer: Self-pay | Admitting: Internal Medicine

## 2019-04-14 ENCOUNTER — Telehealth: Payer: Self-pay | Admitting: Internal Medicine

## 2019-04-14 NOTE — Telephone Encounter (Signed)
If wants in person visit can move back do 07/2019- no later than 09/04/2019   If he wants appt as virtual upcoming as scheduled change to virtual   [place]

## 2019-04-18 NOTE — Telephone Encounter (Signed)
Left message for patient to return call back. PEC may give and obtain information.  

## 2019-04-18 NOTE — Telephone Encounter (Signed)
pt wanting to stay with 7/1 visit as a virtual cell # [phone]

## 2019-04-26 ENCOUNTER — Other Ambulatory Visit: Payer: Self-pay | Admitting: Family Medicine

## 2019-04-26 ENCOUNTER — Other Ambulatory Visit: Payer: Self-pay | Admitting: Internal Medicine

## 2019-04-26 DIAGNOSIS — K219 Gastro-esophageal reflux disease without esophagitis: Secondary | ICD-10-CM

## 2019-04-26 MED ORDER — FAMOTIDINE 20 MG PO TABS: 20.0000 mg | ORAL_TABLET | Freq: Two times a day (BID) | ORAL | 3 refills | Status: DC

## 2019-05-01 ENCOUNTER — Encounter: Payer: Self-pay | Admitting: Internal Medicine

## 2019-05-03 ENCOUNTER — Other Ambulatory Visit: Payer: Self-pay

## 2019-05-03 ENCOUNTER — Encounter: Payer: Self-pay | Admitting: Internal Medicine

## 2019-05-03 ENCOUNTER — Ambulatory Visit (INDEPENDENT_AMBULATORY_CARE_PROVIDER_SITE_OTHER): Payer: Medicare Other | Admitting: Internal Medicine

## 2019-05-03 DIAGNOSIS — N400 Enlarged prostate without lower urinary tract symptoms: Secondary | ICD-10-CM

## 2019-05-03 DIAGNOSIS — I10 Essential (primary) hypertension: Secondary | ICD-10-CM

## 2019-05-03 DIAGNOSIS — E119 Type 2 diabetes mellitus without complications: Secondary | ICD-10-CM | POA: Diagnosis not present

## 2019-05-03 DIAGNOSIS — E782 Mixed hyperlipidemia: Secondary | ICD-10-CM | POA: Diagnosis not present

## 2019-05-03 DIAGNOSIS — I25118 Atherosclerotic heart disease of native coronary artery with other forms of angina pectoris: Secondary | ICD-10-CM | POA: Diagnosis not present

## 2019-05-03 DIAGNOSIS — Z951 Presence of aortocoronary bypass graft: Secondary | ICD-10-CM

## 2019-05-03 DIAGNOSIS — Z1329 Encounter for screening for other suspected endocrine disorder: Secondary | ICD-10-CM

## 2019-05-03 DIAGNOSIS — R972 Elevated prostate specific antigen [PSA]: Secondary | ICD-10-CM

## 2019-05-03 MED ORDER — METFORMIN HCL 500 MG PO TABS: 500.0000 mg | ORAL_TABLET | Freq: Every day | ORAL | 3 refills | Status: DC

## 2019-05-03 NOTE — Progress Notes (Addendum)
Telephone Note failed audio  I connected with Shannon Chung  on 05/03/19 at 10:45 AM EDT by a telephone and verified that I am speaking with the correct person using two identifiers.  Location patient: home Location provider:work Persons participating in the virtual visit: patient, provider  I discussed the limitations of evaluation and management by telemedicine and the availability of in person appointments. The patient expressed understanding and agreed to proceed.   HPI: 1. HTN controlled on meds lisinopril 2.5 mg qd toprol XL 25 mg qd  2. CAD no chest pain, sob or abnormal beats had cardiology f/u with Dr. Rockey Situ 04/2019 telemedicine and no change in therapy  3.  GERD controlled with prevacid and pepcid  4. DM 2 A1C 6.7 on metformin 500 mg qd   [place] is currently in [place] due to 1 of his daughters has retroperioteneal liposarcoma x 8 years s/p surgery recently with extensive tumor involvement in vasculature and GI trace with small bowel obstruction/bypass ileostomy and taking longer to recover so he and his ex wife alternate weeks caring for the house with his daughters 2 sons 70 and 70 y.o His daughter is 84 y.o   ROS: See pertinent positives and negatives per HPI. General weight is 222 lbs  Psych denies depression/anxiety  Past Medical History:  Diagnosis Date  . Coronary artery disease    a. 06/2015 Cardiac CT: Ca score 1103 (84th %'ile);  b. 07/2015 Cath: LM 70, LAD 80p, 100/4m D1 70, D2 95, RI 75, RCA 100p/m;  c. 07/2015 CABG x 5 (LIMA->LAD, VG->Diag, VG->OM1->OM2, VG->OM3).  . Diabetes mellitus without complication ([place] 91/5726 . Dyslipidemia   . Essential hypertension   . 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 Fuchs' corneal dystrophy 2016   sees Dr KMaudie Mercury . Fuchs' corneal dystrophy   . GERD (gastroesophageal reflux disease)   . Heart attack ([place]    silent  . Heart disease    history of blood clot in left ventricle per pt   . History  of radiation exposure    right vocal cord squamous cell cancer  . Impingement syndrome of right shoulder 10/2015   s/p steroid injection Dr MSabra Heck . Ischemic cardiomyopathy    a. dilated, EF 35% improved to 45-50% (2015);  b. 07/2015 EF 25-35% by LV gram.  . 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 isolated episode, not on [place]  .Marland KitchenMural thrombus of cardiac apex    a. 06/2014: LV; resolved with coumadin-->no residual on f/u echo, no longer on coumadin.  . Osteoarthritis    a. R-shoulder, L-knee (Sabra Heckortho)  . Skin cancer    squamous and basal, sees derm regularly Dr. IKellie Moor  . Squamous cell carcinoma of vocal cord ([place] 2008   XRT; right vocal cord; had f/u until 2013   . Thrombocytopenia ([place]   . Vitamin D deficiency     Past Surgical History:  Procedure Laterality Date  . BICEPS TENDON REPAIR Right 1993  . CARDIAC CATHETERIZATION N/A 07/05/2015   Procedure: Left Heart Cath and Coronary Angiography;  Surgeon: TMinna Merritts MD;  [facility];  Service: Cardiovascular;  Laterality: N/A;  . CATARACT EXTRACTION Left 12/2016   with keratoplasty  . COLONOSCOPY  2007  . COLONOSCOPY WITH PROPOFOL N/A 12/02/2017   TA, SSA, rpt 3 yrs(Tahiliani, Varnita B, MD)  . CORONARY ARTERY BYPASS GRAFT N/A 07/29/2015   Procedure: CORONARY ARTERY BYPASS  GRAFTING (CABG) x 5 (LIMA to LAD, SVG to DIAGONAL, SVG SEQUENTIALLY to OM1 and OM2, SVG to OM3) with Endoscopic Vein Havesting of GREATER SAPHENOUS VEIN from RIGHT THIGH and partial LOWER LEG ;  Surgeon: [name], MD;  Location: [place] OR;  Service: Open Heart Surgery;  Laterality: N/A;  . EYE SURGERY     b/l cataract and cornea replaced   . JOINT REPLACEMENT    . KNEE ARTHROSCOPY Left remote  . right biceps tendon     repair/re attachment   . SKIN CANCER EXCISION  10/2015   BCC - L ala (pending MOHs) and L scapula (complete excision)  . TEE WITHOUT CARDIOVERSION N/A 07/29/2015   Procedure: TRANSESOPHAGEAL ECHOCARDIOGRAM  (TEE);  Surgeon: [name], MD;  Location: [place];  Service: Open Heart Surgery;  Laterality: N/A;  . TONSILLECTOMY  1949  . TOTAL KNEE ARTHROPLASTY Left 03/18/2016   cemented L TKR; Earnestine Leys, MD    Family History  Problem Relation Age of Onset  . CAD Father 34       MI  . Hypertension Father   . Hyperlipidemia Father   . Alcoholism Father   . Diabetes Father   . Cancer Daughter        dx'ed 72 retroperitoneal liposarcoma     SOCIAL HX: lives with fiance    Current Outpatient Medications:  .  acetaminophen (TYLENOL) 500 MG tablet, Take 500 mg by mouth every 6 (six) hours as needed for mild pain. , Disp: , Rfl:  .  aspirin (ASPIRIN EC) 81 MG EC tablet, Take 162 mg by mouth daily. Swallow whole. , Disp: , Rfl:  .  atorvastatin (LIPITOR) 40 MG tablet, Take 1 tablet (40 mg total) by mouth daily., Disp: 90 tablet, Rfl: 3 .  Cholecalciferol (VITAMIN D) 2000 units CAPS, Take 1 capsule (2,000 Units total) daily by mouth., Disp: 30 capsule, Rfl:  .  famotidine (PEPCID) 20 MG tablet, Take 1 tablet (20 mg total) by mouth 2 (two) times daily. Daily to 2x per day. D/c zantac it is discontinued, Disp: 180 tablet, Rfl: 3 .  fluticasone (FLONASE) 50 MCG/ACT nasal spray, Place 1 spray into both nostrils as needed for allergies. Reported on 03/18/2016, Disp: , Rfl:  .  lansoprazole (PREVACID) 30 MG capsule, Take 1 capsule (30 mg total) by mouth every morning., Disp: 90 capsule, Rfl: 3 .  lisinopril (ZESTRIL) 2.5 MG tablet, Take 1 tablet (2.5 mg total) by mouth daily., Disp: 90 tablet, Rfl: 3 .  metFORMIN (GLUCOPHAGE) 500 MG tablet, Take 1 tablet (500 mg total) by mouth daily with breakfast., Disp: 90 tablet, Rfl: 3 .  metoprolol succinate (TOPROL-XL) 25 MG 24 hr tablet, TAKE 1 TABLET (25 MG TOTAL) BY MOUTH DAILY., Disp: 90 tablet, Rfl: 1 .  prednisoLONE acetate (PRED FORTE) 1 % ophthalmic suspension, Place 1 drop into both eyes. , Disp: , Rfl: 3 .  sildenafil (REVATIO) 20 MG tablet, Take 2-4  tablets (40-80 mg total) by mouth daily as needed (relations)., Disp: 30 tablet, Rfl: 11 .  vitamin B-12 (CYANOCOBALAMIN) 500 MCG tablet, Take 500 mcg by mouth daily. Reported on 03/17/2016, Disp: , Rfl:   EXAM: prior to audio messing up   VITALS per patient if applicable:  GENERAL: alert, oriented, appears well and in no acute distress  HEENT: atraumatic, conjunttiva clear, no obvious abnormalities on inspection of external nose and ears  NECK: normal movements of the head and neck  LUNGS: on inspection no signs of  respiratory distress, breathing rate appears normal, no obvious gross SOB, gasping or wheezing  CV: no obvious cyanosis  MS: moves all visible extremities without noticeable abnormality  PSYCH/NEURO: pleasant and cooperative, no obvious depression or anxiety, speech and thought processing grossly intact  ASSESSMENT AND PLAN:  Discussed the following assessment and plan:  Type 2 diabetes mellitus without complication, without long-term current use of insulin (HCC) - Plan: metFORMIN (GLUCOPHAGE) 500 MG tablet, Comprehensive metabolic panel, CBC with Differential/Platelet, Hemoglobin A1c, Lipid panel, Urinalysis, Routine w reflex microscopic, Microalbumin / creatinine urine ratio -do foot exam in future and ask about eye exam   Essential hypertension - Plan: Comprehensive metabolic panel, CBC with Differential/Platelet, Urinalysis, Routine w reflex microscopic, Microalbumin / creatinine urine ratio -cont meds   Mixed hyperlipidemia - Plan: Coronary artery disease of native artery of native heart with stable angina pectoris (HCC) - Plan: Hx of CABG - Plan:  -cont meds f/u cards in 6 months   GERD controlled cont meds   HM Had flu, prevnar, pna 23, Tdap, Zoster,  Had 1/2 shingrix 2nd due 10/15/18 since missed likely needs to start series over  MMR immune, hep B immune  -Consider hep A vaccine in future h/o fatty liver   sch labs week of 05/22/19   Colonoscopy  [facility] 12/02/17 rec repeat in 3 years sessile serrated and tubular  F/u dermatology Dr. Kellie Moor h/o [facility], [facility] appt 11 or 10/2018  Former smoker 1 pk/week 1967-1980 no FH lung cancer, no chew   If plts continue to be low  -will rec hematology in future   [facility] seen 11/07/2018 Dr. Junious Silk BPH with LUTS stable PSA in 6 months PSA was 3.07  -pt will call to schedule to see when due missed appt 2/2 [facility] seen 07/11/2019 BPH Dr. Junious Silk  -psa 3.32 06/29/19, 3.07 11/03/18 -f/u 1 year    ENT-ask at f/u if he still sees due to h/o right vocal cord [facility]  Saw ENT Dr. Richardson Landry 07/31/19 LPR no recurrence cancer mild RT changes some mild reflux related to irritation rec PPI pm meals   I discussed the assessment and treatment plan with the patient. The patient was provided an opportunity to ask questions and all were answered. The patient agreed with the plan and demonstrated an understanding of the instructions.   The patient was advised to call back or seek an in-person evaluation if the symptoms worsen or if the condition fails to improve as anticipated.  Time spent 25 minutes  Delorise Jackson, MD

## 2019-05-04 ENCOUNTER — Encounter: Payer: Self-pay | Admitting: Internal Medicine

## 2019-05-08 ENCOUNTER — Telehealth: Payer: Self-pay | Admitting: *Deleted

## 2019-05-08 ENCOUNTER — Encounter: Payer: Self-pay | Admitting: Internal Medicine

## 2019-05-08 NOTE — Telephone Encounter (Signed)
-----   Message from Delorise Jackson, MD sent at 05/08/2019 11:10 AM EDT -----  COVID 19 testing   Primary Coverage  Payer Plan Sponsor Code Group Number Group Name [id number]  [id number]  Primary Subscriber  ID Name [facility] Address [id number] [facility] [id number] [address]     [place], [place] [address]  DOB 04/05/1941

## 2019-05-08 NOTE — Telephone Encounter (Signed)
Called patient to schedule covid testing and he [facility]

## 2019-05-18 ENCOUNTER — Encounter: Payer: Self-pay | Admitting: Internal Medicine

## 2019-06-13 ENCOUNTER — Other Ambulatory Visit: Payer: Self-pay

## 2019-06-13 ENCOUNTER — Other Ambulatory Visit (INDEPENDENT_AMBULATORY_CARE_PROVIDER_SITE_OTHER): Payer: Medicare Other

## 2019-06-13 DIAGNOSIS — Z1329 Encounter for screening for other suspected endocrine disorder: Secondary | ICD-10-CM | POA: Diagnosis not present

## 2019-06-13 DIAGNOSIS — E119 Type 2 diabetes mellitus without complications: Secondary | ICD-10-CM | POA: Diagnosis not present

## 2019-06-13 DIAGNOSIS — I10 Essential (primary) hypertension: Secondary | ICD-10-CM | POA: Diagnosis not present

## 2019-06-13 LAB — COMPREHENSIVE METABOLIC PANEL
ALT: 14 U/L (ref 0–53)
AST: 16 U/L (ref 0–37)
Albumin: 4.5 g/dL (ref 3.5–5.2)
Alkaline Phosphatase: 54 U/L (ref 39–117)
BUN: 17 mg/dL (ref 6–23)
CO2: 25 mEq/L (ref 19–32)
Calcium: 9.5 mg/dL (ref 8.4–10.5)
Chloride: 103 mEq/L (ref 96–112)
Creatinine, Ser: 0.85 mg/dL (ref 0.40–1.50)
GFR: 87.67 mL/min (ref >60.00)
Glucose, Bld: 124 mg/dL — ABNORMAL HIGH (ref 70–99)
Potassium: 4.2 mEq/L (ref 3.5–5.1)
Sodium: 138 mEq/L (ref 135–145)
Total Bilirubin: 0.8 mg/dL (ref 0.2–1.2)
Total Protein: 7 g/dL (ref 6.0–8.3)

## 2019-06-13 LAB — LIPID PANEL
Cholesterol: 144 mg/dL (ref 0–200)
HDL: 47.8 mg/dL (ref >39.00)
LDL Cholesterol: 67 mg/dL (ref 0–99)
NonHDL: 95.8
Total CHOL/HDL Ratio: 3
Triglycerides: 143 mg/dL (ref 0.0–149.0)
VLDL: 28.6 mg/dL (ref 0.0–40.0)

## 2019-06-13 LAB — CBC WITH DIFFERENTIAL/PLATELET
Basophils Absolute: 0.1 10*3/uL (ref 0.0–0.1)
Basophils Relative: 0.7 % (ref 0.0–3.0)
Eosinophils Absolute: 0.2 10*3/uL (ref 0.0–0.7)
Eosinophils Relative: 2.1 % (ref 0.0–5.0)
HCT: 43.1 % (ref 39.0–52.0)
Hemoglobin: 14.4 g/dL (ref 13.0–17.0)
Lymphocytes Relative: 35.2 % (ref 12.0–46.0)
Lymphs Abs: 2.6 10*3/uL (ref 0.7–4.0)
MCHC: 33.4 g/dL (ref 30.0–36.0)
MCV: 97.9 fl (ref 78.0–100.0)
Monocytes Absolute: 0.5 10*3/uL (ref 0.1–1.0)
Monocytes Relative: 7.5 % (ref 3.0–12.0)
Neutro Abs: 4 10*3/uL (ref 1.4–7.7)
Neutrophils Relative %: 54.5 % (ref 43.0–77.0)
Platelets: 148 10*3/uL — ABNORMAL LOW (ref 150.0–400.0)
RBC: 4.4 Mil/uL (ref 4.22–5.81)
RDW: 13.6 % (ref 11.5–15.5)
WBC: 7.3 10*3/uL (ref 4.0–10.5)

## 2019-06-13 LAB — HEMOGLOBIN A1C: Hgb A1c MFr Bld: 6.9 % — ABNORMAL HIGH (ref 4.6–6.5)

## 2019-06-13 LAB — TSH: TSH: 1.55 u[IU]/mL (ref 0.35–4.50)

## 2019-06-13 NOTE — Addendum Note (Signed)
Addended by: Leeanne Rio on: 06/13/2019 09:40 AM   Modules accepted: Orders

## 2019-06-14 ENCOUNTER — Other Ambulatory Visit: Payer: Medicare Other

## 2019-06-14 DIAGNOSIS — E119 Type 2 diabetes mellitus without complications: Secondary | ICD-10-CM

## 2019-06-15 ENCOUNTER — Other Ambulatory Visit: Payer: Medicare Other

## 2019-06-15 LAB — MICROALBUMIN / CREATININE URINE RATIO
Creatinine, Urine: 144 mg/dL (ref 20–320)
Microalb Creat Ratio: 8 mcg/mg creat (ref <30)
Microalb, Ur: 1.1 mg/dL

## 2019-06-15 LAB — URINALYSIS, ROUTINE W REFLEX MICROSCOPIC
Bilirubin Urine: NEGATIVE
Glucose, UA: NEGATIVE
Hgb urine dipstick: NEGATIVE
Ketones, ur: NEGATIVE
Leukocytes,Ua: NEGATIVE
Nitrite: NEGATIVE
Protein, ur: NEGATIVE
Specific Gravity, Urine: 1.03 (ref 1.001–1.03)
pH: 5.5 (ref 5.0–8.0)

## 2019-07-04 ENCOUNTER — Other Ambulatory Visit: Payer: Self-pay | Admitting: Internal Medicine

## 2019-07-04 DIAGNOSIS — N529 Male erectile dysfunction, unspecified: Secondary | ICD-10-CM

## 2019-07-04 MED ORDER — SILDENAFIL CITRATE 20 MG PO TABS: 40.0000 mg | ORAL_TABLET | Freq: Every day | ORAL | 11 refills | Status: DC | PRN

## 2019-07-07 DIAGNOSIS — M6530 Trigger finger, unspecified finger: Secondary | ICD-10-CM | POA: Insufficient documentation

## 2019-07-07 HISTORY — DX: Trigger finger, unspecified finger: M65.30

## 2019-07-11 ENCOUNTER — Encounter: Payer: Self-pay | Admitting: Internal Medicine

## 2019-07-15 ENCOUNTER — Other Ambulatory Visit: Payer: Self-pay | Admitting: Cardiovascular Disease

## 2019-07-17 ENCOUNTER — Other Ambulatory Visit: Payer: Self-pay | Admitting: Internal Medicine

## 2019-07-17 ENCOUNTER — Encounter: Payer: Self-pay | Admitting: Internal Medicine

## 2019-07-17 DIAGNOSIS — C320 Malignant neoplasm of glottis: Secondary | ICD-10-CM

## 2019-07-31 ENCOUNTER — Encounter: Payer: Self-pay | Admitting: Internal Medicine

## 2019-08-02 ENCOUNTER — Encounter: Payer: Self-pay | Admitting: Internal Medicine

## 2019-08-04 NOTE — Progress Notes (Signed)
Cardiology Office Note  Date:  08/07/2019   ID:  Shannon Chung, DOB 01/01/1941, MRN [id number]  PCP:  [name], Shannon Glow, MD   Chief Complaint  Patient presents with  . other    6 month follow up. Meds reviewed by the pt. verbally. "doing well."     HPI:  [name] is a 78 year old male with  squamous cell carcinoma of the hypopharynx, status post radiation coronary artery disease,   severe multivessel disease, with CABG at [facility] August 2016,  ischemic cardiomyopathy, initial ejection fraction in July 2015 of 35%, up to 55% after CABG cardiac PET scan showing scar in the apical and periapical region,  mural thrombus seen in July 2015, started on anticoagulation,  hyperlipidemia,   who presents for routine follow-up of his coronary artery disease   Feels well,not going to the gym  Recently seen by ENT to look at throat, previous XRT in [address] Now taking prevacid daily, pepcid as needed  PVCs , but no sx Well controlled on metoprolol  Denies any chest pain concerning for angina Before the virus, Active at the gym goes on a regular basis Now doing house chores  Lab work reviewed with him in detail LDL 67 low platelets, up to 150, stable  EKG personally reviewed by myself on todays visit Shows normal sinus rhythm 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  PSH:    Past Surgical History:  Procedure Laterality Date  . BICEPS TENDON REPAIR Right 1993  . CARDIAC CATHETERIZATION N/A 07/05/2015   Procedure: Left Heart Cath and Coronary Angiography;  Surgeon: Minna Merritts, MD;  Location: [place] [facility];  Service: Cardiovascular;  Laterality: N/A;  . CATARACT EXTRACTION Left 12/2016   with keratoplasty  . COLONOSCOPY  2007  . COLONOSCOPY WITH PROPOFOL N/A 12/02/2017   TA, SSA, rpt 3 yrs(Tahiliani, Varnita B, MD)  . CORONARY ARTERY BYPASS GRAFT N/A 07/29/2015   Procedure: CORONARY ARTERY BYPASS GRAFTING (CABG) x 5 (LIMA to LAD, SVG to DIAGONAL, SVG SEQUENTIALLY to OM1 and OM2, SVG to OM3) with Endoscopic Vein Havesting of GREATER SAPHENOUS VEIN from RIGHT THIGH and partial LOWER LEG ;  Surgeon: [name], MD;  Location: [facility];  Service: Open Heart Surgery;  Laterality: N/A;  . EYE SURGERY     b/l cataract and cornea replaced   . JOINT REPLACEMENT    . KNEE ARTHROSCOPY Left remote  . right biceps tendon     repair/re attachment   . SKIN CANCER EXCISION  10/2015   BCC - L ala (pending MOHs) and L scapula (complete excision)  .  TEE WITHOUT CARDIOVERSION N/A 07/29/2015   Procedure: TRANSESOPHAGEAL ECHOCARDIOGRAM (TEE);  Surgeon: [name], MD;  Location: [place];  Service: Open Heart Surgery;  Laterality: N/A;  . TONSILLECTOMY  1949  . TOTAL KNEE ARTHROPLASTY Left 03/18/2016   cemented L TKR; Earnestine Leys, MD    Current Outpatient Medications   Medication Sig Dispense Refill  . acetaminophen (TYLENOL) 500 MG tablet Take 500 mg by mouth every 6 (six) hours as needed for mild pain.     Marland Kitchen aspirin (ASPIRIN EC) 81 MG EC tablet Take 162 mg by mouth daily. Swallow whole.     Marland Kitchen atorvastatin (LIPITOR) 40 MG tablet Take 1 tablet (40 mg total) by mouth daily. 90 tablet 3  . Cholecalciferol (VITAMIN D) 2000 units CAPS Take 1 capsule (2,000 Units total) daily by mouth. 30 capsule   . famotidine (PEPCID) 20 MG tablet Take 1 tablet (20 mg total) by mouth 2 (two) times daily. Daily to 2x per day. D/c zantac it is discontinued 180 tablet 3  . fluticasone (FLONASE) 50 MCG/ACT nasal spray Place 1 spray into both nostrils as needed for allergies. Reported on 03/18/2016    . lansoprazole (PREVACID) 30 MG capsule Take 1 capsule (30 mg total) by mouth every morning. 90 capsule 3  . lisinopril (ZESTRIL) 2.5 MG tablet Take 1 tablet (2.5 mg total) by mouth daily. 90 tablet 3  . metFORMIN (GLUCOPHAGE) 500 MG tablet Take 1 tablet (500 mg total) by mouth daily with breakfast. 90 tablet 3  . metoprolol succinate (TOPROL-XL) 25 MG 24 hr tablet TAKE 1 TABLET BY MOUTH EVERY DAY 90 tablet 3  . prednisoLONE acetate (PRED FORTE) 1 % ophthalmic suspension Place 1 drop into both eyes.   3  . sildenafil (REVATIO) 20 MG tablet Take 2-4 tablets (40-80 mg total) by mouth daily as needed (relations). 30 tablet 11  . vitamin B-12 (CYANOCOBALAMIN) 500 MCG tablet Take 500 mcg by mouth daily. Reported on 03/17/2016     No current facility-administered medications for this visit.      Allergies:   Pollen extract   Social History:  The patient  reports that he quit smoking about 40 years ago. His smoking use included cigarettes. He has a 5.00 pack-year smoking history. He has never used smokeless tobacco. He reports current alcohol use. He reports that he does not use drugs.   Family History:   family history includes Alcoholism in his father; CAD (age of onset: 34) in his father;  Cancer in his daughter; Diabetes in his father; Hyperlipidemia in his father; Hypertension in his father.    Review of Systems: Review of Systems  Constitutional: Negative.   HENT: Negative.   Respiratory: Negative.   Cardiovascular: Negative.   Gastrointestinal: Negative.   Musculoskeletal: Negative.   Neurological: Negative.   Psychiatric/Behavioral: Negative.   All other systems reviewed and are negative.   PHYSICAL EXAM: VS:  BP 140/76 (BP Location: Left Arm, Patient Position: Sitting, Cuff Size: Normal)   Pulse 73   Temp (!) 97.4 F (36.3 C)   Ht 6' (1.829 m)   Wt 224 lb 8 oz (101.8 kg)   BMI 30.45 kg/m  , BMI Body mass index is 30.45 kg/m.  Constitutional:  oriented to person, place, and time. No distress.  HENT:  Head: Grossly normal Eyes:  no discharge. No scleral icterus.  Neck: No JVD, no carotid bruits  Cardiovascular: Regular rate and rhythm, no murmurs appreciated Pulmonary/Chest: Clear to auscultation bilaterally, no wheezes or  rails Abdominal: Soft.  no distension.  no tenderness.  Musculoskeletal: Normal range of motion Neurological:  normal muscle tone. Coordination normal. No atrophy Skin: Skin warm and dry Psychiatric: normal affect, pleasant   Recent Labs: 06/13/2019: ALT 14; BUN 17; Creatinine, Ser 0.85; Hemoglobin 14.4; Platelets 148.0; Potassium 4.2; Sodium 138; TSH 1.55    Lipid Panel Lab Results  Component Value Date   CHOL 144 06/13/2019   HDL 47.80 06/13/2019   LDLCALC 67 06/13/2019   TRIG 143.0 06/13/2019      Wt Readings from Last 3 Encounters:  08/07/19 224 lb 8 oz (101.8 kg)  08/05/18 228 lb 9.6 oz (103.7 kg)  05/02/18 230 lb (104.3 kg)       ASSESSMENT AND PLAN:  Ischemic cardiomyopathy - Plan: EKG 12-Lead Ejection fraction improved after bypass surgery  euvolemic, Recommended regular walking program  Mixed hyperlipidemia - Plan: EKG 12-Lead Goal LDL 60 or less Continue a atorvastatin, add Zetia  Essential  hypertension - Plan: EKG 12-Lead Blood pressure is well controlled on today's visit. No changes made to the medications.  Hx of CABG - Plan: EKG 12-Lead Reports no significant unstable angina symptoms We will add Zetia  PVCs  No symptoms, continue metoprolol no further work-up needed  Obesity Exercising on a regular basis, weight trending down   Total encounter time more than 25 minutes  Greater than 50% was spent in counseling and coordination of care with the patient   Disposition:   F/U  12 months   Orders Placed This Encounter  Procedures  . EKG 12-Lead     Signed, Esmond Plants, M.D., Ph.D. 08/07/2019  [place], [place] [phone]

## 2019-08-07 ENCOUNTER — Other Ambulatory Visit: Payer: Self-pay

## 2019-08-07 ENCOUNTER — Ambulatory Visit (INDEPENDENT_AMBULATORY_CARE_PROVIDER_SITE_OTHER): Payer: Medicare Other | Admitting: Cardiovascular Disease

## 2019-08-07 ENCOUNTER — Encounter: Payer: Self-pay | Admitting: Cardiovascular Disease

## 2019-08-07 VITALS — BP 140/76 | HR 73 | Temp 97.4°F | Ht 72.0 in | Wt 224.5 lb

## 2019-08-07 DIAGNOSIS — E782 Mixed hyperlipidemia: Secondary | ICD-10-CM

## 2019-08-07 DIAGNOSIS — I10 Essential (primary) hypertension: Secondary | ICD-10-CM

## 2019-08-07 DIAGNOSIS — Z951 Presence of aortocoronary bypass graft: Secondary | ICD-10-CM

## 2019-08-07 DIAGNOSIS — I255 Ischemic cardiomyopathy: Secondary | ICD-10-CM | POA: Diagnosis not present

## 2019-08-07 DIAGNOSIS — R7303 Prediabetes: Secondary | ICD-10-CM

## 2019-08-07 DIAGNOSIS — I25118 Atherosclerotic heart disease of native coronary artery with other forms of angina pectoris: Secondary | ICD-10-CM | POA: Diagnosis not present

## 2019-08-07 DIAGNOSIS — I493 Ventricular premature depolarization: Secondary | ICD-10-CM

## 2019-08-07 MED ORDER — EZETIMIBE 10 MG PO TABS: 10.0000 mg | ORAL_TABLET | Freq: Every day | ORAL | 3 refills | Status: DC

## 2019-08-07 NOTE — Patient Instructions (Addendum)
Medication Instructions:  Please start zetia 10 mg daily  Decrease asa down to 81 mg daily  If you need a refill on your cardiac medications before your next appointment, please call your pharmacy.    Lab work: No new labs needed   If you have labs (blood work) drawn today and your tests are completely normal, you will receive your results only by: Marland Kitchen MyChart Message (if you have MyChart) OR . A paper copy in the mail If you have any lab test that is abnormal or we need to change your treatment, we will call you to review the results.   Testing/Procedures: No new testing needed   Follow-Up: At [facility], you and your health needs are our priority.  As part of our continuing mission to provide you with exceptional heart care, we have created designated Provider Care Teams.  These Care Teams include your primary Cardiologist (physician) and Advanced Practice Providers (APPs -  Physician Assistants and Nurse Practitioners) who all work together to provide you with the care you need, when you need it.  . You will need a follow up appointment in 12 months .   Please call our office 2 months in advance to schedule this appointment.    . Providers on your designated Care Team:   . Murray Hodgkins, NP . Christell Faith, PA-C . Marrianne Mood, PA-C  Any Other Special Instructions Will Be Listed Below (If Applicable).  For educational health videos Log in to : [email or web address] Or : [email or web address], password : [email or web address]

## 2019-08-14 ENCOUNTER — Other Ambulatory Visit: Payer: Self-pay

## 2019-08-14 ENCOUNTER — Encounter: Payer: Self-pay | Admitting: Internal Medicine

## 2019-08-14 ENCOUNTER — Ambulatory Visit (INDEPENDENT_AMBULATORY_CARE_PROVIDER_SITE_OTHER): Payer: Medicare Other

## 2019-08-14 DIAGNOSIS — Z0000 Encounter for general adult medical examination without abnormal findings: Secondary | ICD-10-CM

## 2019-08-14 NOTE — Patient Instructions (Addendum)
  Shannon Chung , Thank you for taking time to come for your Medicare Wellness Visit. I appreciate your ongoing commitment to your health goals. Please review the following plan we discussed and let me know if I can assist you in the future.   These are the goals we discussed: Goals    . Follow up with Primary Care Provider     As needed       This is a list of the screening recommended for you and due dates:  Health Maintenance  Topic Date Due  . Complete foot exam   09/15/2018  . Eye exam for diabetics  10/14/2018  . Hemoglobin A1C  12/14/2019  . Colon Cancer Screening  12/02/2020  . Tetanus Vaccine  05/01/2025  . Flu Shot  Completed  . Pneumonia vaccines  Completed

## 2019-08-14 NOTE — Progress Notes (Signed)
Subjective:   Randle Zebrowski is a 78 y.o. male who presents for Medicare Annual/Subsequent preventive examination.  Review of Systems:  No ROS.  Medicare Wellness Virtual Visit.  Visual/audio telehealth visit, UTA vital signs.   See social history for additional risk factors.   Cardiac Risk Factors include: advanced age (>71men, >44 women);diabetes mellitus;hypertension;male gender     Objective:    Vitals: There were no vitals taken for this visit.  There is no height or weight on file to calculate BMI.  Advanced Directives 08/14/2019 12/02/2017 02/18/2017 03/18/2016 03/04/2016 08/28/2015 07/31/2015  Does Patient Have a Medical Advance Directive? Yes Yes Yes Yes Yes Yes Yes  Type of Paramedic of [place];Living will [place];Living will [place];Living will [place];Living will [place];Living will [place];Living will [place];Living will  Does patient want to make changes to medical advance directive? No - Patient declined - - - - No - Patient declined No - Patient declined  Copy of [place] in Chart? Yes - validated most recent copy scanned in chart (See row information) Yes No - copy requested No - copy requested No - copy requested Yes Yes    Tobacco Social History   Tobacco Use  Smoking Status Former Smoker  . Packs/day: 0.50  . Years: 10.00  . Pack years: 5.00  . Types: Cigarettes  . Quit date: 11/02/1978  . Years since quitting: 40.8  Smokeless Tobacco Never Used  Tobacco Comment   former smoker [phone] 1 pk/week no FH lung cancer      Counseling given: Not Answered Comment: former smoker [phone] 1 pk/week no FH lung cancer    Clinical Intake:  Pre-visit preparation completed: Yes        Diabetes: Yes(Followed by pcp)  How often do you need to have someone help you when you read instructions,  pamphlets, or other written materials from your doctor or pharmacy?: 1 - Never  Interpreter Needed?: No     Past Medical History:  Diagnosis Date  . Coronary artery disease    a. 06/2015 Cardiac CT: Ca score 1103 (84th %'ile);  b. 07/2015 Cath: LM 70, LAD 80p, 100/54m, D1 70, D2 95, RI 75, RCA 100p/m;  c. 07/2015 CABG x 5 (LIMA->LAD, VG->Diag, VG->OM1->OM2, VG->OM3).  . Diabetes mellitus without complication ([place]) [id number]  . Dyslipidemia   . Essential hypertension   . 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 Fuchs' corneal dystrophy 2016   sees Dr Maudie Mercury  . Fuchs' corneal dystrophy   . GERD (gastroesophageal reflux disease)   . Heart attack ([place])    silent  . Heart disease    history of blood clot in left ventricle per pt   . History of radiation exposure    right vocal cord squamous cell cancer  . Impingement syndrome of right shoulder 10/2015   s/p steroid injection Dr Sabra Heck  . Ischemic cardiomyopathy    a. dilated, EF 35% improved to 45-50% (2015);  b. 07/2015 EF 25-35% by LV gram.  . 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 isolated episode, not on [place].  Marland Kitchen Mural thrombus of cardiac apex    a. 06/2014: LV; resolved with coumadin-->no residual on f/u echo, no longer on coumadin.  . Osteoarthritis    a. R-shoulder, L-knee [facility])  . Skin cancer    squamous and basal, sees  derm regularly Dr. Kellie Moor   . 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 Thrombocytopenia ([place])   . Vitamin D deficiency    Past Surgical History:  Procedure Laterality Date  . BICEPS TENDON REPAIR Right 1993  . CARDIAC CATHETERIZATION N/A 07/05/2015   Procedure: Left Heart Cath and Coronary Angiography;  Surgeon: Minna Merritts, MD;  Location: [facility];  Service: Cardiovascular;  Laterality: N/A;  . CATARACT EXTRACTION Left 12/2016   with keratoplasty  . COLONOSCOPY  2007  .  COLONOSCOPY WITH PROPOFOL N/A 12/02/2017   TA, SSA, rpt 3 yrs(Tahiliani, Varnita B, MD)  . CORONARY ARTERY BYPASS GRAFT N/A 07/29/2015   Procedure: CORONARY ARTERY BYPASS GRAFTING (CABG) x 5 (LIMA to LAD, SVG to DIAGONAL, SVG SEQUENTIALLY to OM1 and OM2, SVG to OM3) with Endoscopic Vein Havesting of GREATER SAPHENOUS VEIN from RIGHT THIGH and partial LOWER LEG ;  Surgeon: [name], MD;  Location: [facility];  Service: Open Heart Surgery;  Laterality: N/A;  . EYE SURGERY     b/l cataract and cornea replaced   . JOINT REPLACEMENT    . KNEE ARTHROSCOPY Left remote  . right biceps tendon     repair/re attachment   . SKIN CANCER EXCISION  10/2015   BCC - L ala (pending MOHs) and L scapula (complete excision)  . TEE WITHOUT CARDIOVERSION N/A 07/29/2015   Procedure: TRANSESOPHAGEAL ECHOCARDIOGRAM (TEE);  Surgeon: [name], MD;  Location: [facility];  Service: Open Heart Surgery;  Laterality: N/A;  . TONSILLECTOMY  1949  . TOTAL KNEE ARTHROPLASTY Left 03/18/2016   cemented L TKR; Earnestine Leys, MD   Family History  Problem Relation Age of Onset  . CAD Father 68       MI  . Hypertension Father   . Hyperlipidemia Father   . Alcoholism Father   . Diabetes Father   . Cancer Daughter        dx'ed 106 retroperitoneal liposarcoma    Social History   Socioeconomic History  . Marital status: Divorced    Spouse name: Not on file  . Number of children: Not on file  . Years of education: Not on file  . Highest education level: Not on file  Occupational History  . Not on file  Social Needs  . Financial resource strain: Not hard at all  . Food insecurity    Worry: Never true    Inability: Never true  . Transportation needs    Medical: No    Non-medical: No  Tobacco Use  . Smoking status: Former Smoker    Packs/day: 0.50    Years: 10.00    Pack years: 5.00    Types: Cigarettes    Quit date: 11/02/1978    Years since quitting: 40.8  . Smokeless tobacco: Never Used  . Tobacco comment:  former smoker [phone] 1 pk/week no FH lung cancer   Substance and Sexual Activity  . Alcohol use: Yes    Alcohol/week: 0.0 standard drinks    Comment: beer/wine on weekends  . Drug use: No  . Sexual activity: Yes  Lifestyle  . Physical activity    Days per week: 0 days    Minutes per session: Not on file  . Stress: Not at all  Relationships  . Social Herbalist on phone: Not on file    Gets together: Not on  file    Attends religious service: Not on file    Active member of club or organization: Not on file    Attends meetings of clubs or organizations: Not on file    Relationship status: Not on file  Other Topics Concern  . Not on file  Social History Narrative   Lives with fiancee for 19yrs Metta Clines)   Moved from [place] years ago in 2015 to this area    Divorced   Retired Corporate treasurer    Occupation Retired Nurse, mental health   Edu: 1 yr college   Activity: volunteers at [place]: good water, fruits/vegetables daily      Tested at risk for OSA in preop for CABG    Outpatient Encounter Medications as of 08/14/2019  Medication Sig  . acetaminophen (TYLENOL) 500 MG tablet Take 500 mg by mouth every 6 (six) hours as needed for mild pain.   Marland Kitchen aspirin (ASPIRIN EC) 81 MG EC tablet Take 162 mg by mouth daily. Swallow whole.   Marland Kitchen atorvastatin (LIPITOR) 40 MG tablet Take 1 tablet (40 mg total) by mouth daily.  . Cholecalciferol (VITAMIN D) 2000 units CAPS Take 1 capsule (2,000 Units total) daily by mouth.  . ezetimibe (ZETIA) 10 MG tablet Take 1 tablet (10 mg total) by mouth daily.  . famotidine (PEPCID) 20 MG tablet Take 1 tablet (20 mg total) by mouth 2 (two) times daily. Daily to 2x per day. D/c zantac it is discontinued  . fluticasone (FLONASE) 50 MCG/ACT nasal spray Place 1 spray into both nostrils as needed for allergies. Reported on 03/18/2016  . lansoprazole (PREVACID) 30 MG capsule Take 1 capsule (30 mg total) by mouth every morning.  Marland Kitchen lisinopril (ZESTRIL) 2.5 MG tablet  Take 1 tablet (2.5 mg total) by mouth daily.  . metoprolol succinate (TOPROL-XL) 25 MG 24 hr tablet TAKE 1 TABLET BY MOUTH EVERY DAY  . prednisoLONE acetate (PRED FORTE) 1 % ophthalmic suspension Place 1 drop into both eyes.   . sildenafil (REVATIO) 20 MG tablet Take 2-4 tablets (40-80 mg total) by mouth daily as needed (relations).  . vitamin B-12 (CYANOCOBALAMIN) 500 MCG tablet Take 500 mcg by mouth daily. Reported on 03/17/2016  . metFORMIN (GLUCOPHAGE) 500 MG tablet Take 1 tablet (500 mg total) by mouth daily with breakfast. (Patient not taking: Reported on 08/14/2019)   No facility-administered encounter medications on file as of 08/14/2019.     Activities of Daily Living In your present state of health, do you have any difficulty performing the following activities: 08/14/2019  Hearing? N  Vision? N  Difficulty concentrating or making decisions? N  Walking or climbing stairs? N  Dressing or bathing? N  Doing errands, shopping? N  Preparing Food and eating ? N  Using the Toilet? N  In the past six months, have you accidently leaked urine? N  Do you have problems with loss of bowel control? N  Managing your Medications? N  Managing your Finances? N  Housekeeping or managing your Housekeeping? N  Some recent data might be hidden    Patient Care Team: [name], [name], MD as PCP - General (Internal Medicine) Minna Merritts, MD as Consulting Physician (Cardiology)   Assessment:   This is a routine wellness examination for Dimitris.  I connected with patient 08/14/19 at 12:00 PM EDT by an audio enabled telemedicine application and verified that I am speaking with the correct person using two identifiers. Patient stated full name and DOB. Patient gave permission  to continue with virtual visit. Patient's location was at home and Nurse's location was at [place] office.   Health Maintenance Due: -Eye Exam- 10/21/19 -Foot Exam- followed by pcp -Hgb A1c- 06/13/19 (6.9) Update  all pending maintenance due as appropriate.   See completed HM at the end of note.   Eye: Visual acuity not assessed. Virtual visit. Wears corrective lenses. Followed by their ophthalmologist every 12 months. Cataract extraction. Partial cornea replacement. Retinopathy- none reported.  Dental: Visits every 6 months.    Hearing: Demonstrates normal hearing during visit.  Safety:  Patient feels safe at home- yes Patient does have smoke detectors at home- yes Patient does wear sunscreen or protective clothing when in direct sunlight - yes Patient does wear seat belt when in a moving vehicle - yes Patient drives- yes Adequate lighting in walkways free from debris- yes Grab bars and handrails used as appropriate- yes Ambulates with no assistive device Cell phone on person when ambulating outside of the home- yes  Social: Alcohol intake - yes      Smoking history- former   Smokers in home? none Illicit drug use? none  Depression: PHQ 2 &9 complete. See screening below. Denies irritability, anhedonia, sadness/tearfullness.  Stable.   Falls: See screening below.    Medication: Holding metformin due to recent information of recall. Questions how he should proceed. Deferred to pcp for follow up.   Covid-19: Precautions and sickness symptoms discussed. Wears mask, social distancing, hand hygiene as appropriate.   Activities of Daily Living Patient denies needing assistance with: household chores, feeding themselves, getting from bed to chair, getting to the toilet, bathing/showering, dressing, managing money, or preparing meals.   Memory: Patient is alert. Patient denies difficulty focusing or concentrating. Correctly identified the president of the [place], season and recall. Patient likes to read, build website, play scrabble and computer games, for brain stimulation.  BMI- discussed the importance of a healthy diet, water intake and the benefits of aerobic exercise.  Educational  material provided.  Physical activity- walking, no routine  Diet: Mediterranean diet Water: good intake Caffeine: no  Other Providers Patient Care Team: [name], [name], MD as PCP - General (Internal Medicine) Minna Merritts, MD as Consulting Physician (Cardiology)  Exercise Activities and Dietary recommendations Current Exercise Habits: Home exercise routine, Type of exercise: walking, Intensity: Mild  Goals    . Follow up with Primary Care Provider     As needed       [place]  08/14/2019 08/05/2018 02/10/2018 02/18/2017 10/21/2015  Falls in the past year? 0 No No No No  Timed Get Up and Go Performed: no, virtual visit  Depression Screen PHQ 2/9 Scores 08/14/2019 08/05/2018 02/10/2018 02/18/2017  PHQ - 2 Score 0 0 0 0  PHQ- 9 Score - - - -    Cognitive Function MMSE - Mini Mental State Exam 02/18/2017  Orientation to time 5  Orientation to Place 5  Registration 3  Attention/ Calculation 0  Recall 3  Language- name 2 objects 0  Language- repeat 1  Language- follow 3 step command 3  Language- read & follow direction 0  Write a sentence 0  Copy design 0  Total score 20     6CIT Screen 08/14/2019  What Year? 0 points  What month? 0 points  What time? 0 points  Count back from 20 0 points  Months in reverse 0 points  Repeat phrase 0 points  Total Score 0    Immunization  History  Administered Date(s) Administered  . Fluad Quad(high Dose 65+) 07/03/2019  . Influenza, High Dose Seasonal PF 08/24/2017, 07/28/2018  . Influenza, Seasonal, Injecte, Preservative Fre 07/20/2016  . Influenza,inj,Quad PF,6+ Mos 08/12/2015  . Pneumococcal Conjugate-13 01/08/2014  . Pneumococcal Polysaccharide-23 08/02/2012, 08/01/2015  . Tdap 05/02/2015  . Zoster 11/02/2005  . Zoster Recombinat (Shingrix) 04/15/2018, 05/05/2019   Screening Tests Health Maintenance  Topic Date Due  . FOOT EXAM  09/15/2018  . OPHTHALMOLOGY EXAM  10/14/2018  . HEMOGLOBIN A1C   12/14/2019  . COLONOSCOPY  12/02/2020  . TETANUS/TDAP  05/01/2025  . INFLUENZA VACCINE  Completed  . PNA vac Low Risk Adult  Completed       Plan:    Keep all routine maintenance appointments.   Follow up 11/07/19  Medicare Attestation I have personally reviewed: The patient's medical and social history Their use of alcohol, tobacco or illicit drugs Their current medications and supplements The patient's functional ability including ADLs,fall risks, home safety risks, cognitive, and hearing and visual impairment Diet and physical activities Evidence for depression   In addition, I have reviewed and discussed with patient certain preventive protocols, quality metrics, and best practice recommendations. A written personalized care plan for preventive services as well as general preventive health recommendations were provided to patient via mail.     Varney Biles, LPN  [id number]

## 2019-08-14 NOTE — Progress Notes (Signed)
Only metformin on recall was XR he is on IR ok to continue this for now   [place]

## 2019-08-28 DIAGNOSIS — S76011A Strain of muscle, fascia and tendon of right hip, initial encounter: Secondary | ICD-10-CM

## 2019-08-28 HISTORY — DX: Strain of muscle, fascia and tendon of right hip, initial encounter: S76.011A

## 2019-11-06 ENCOUNTER — Encounter: Payer: Self-pay | Admitting: Internal Medicine

## 2019-11-07 ENCOUNTER — Ambulatory Visit (INDEPENDENT_AMBULATORY_CARE_PROVIDER_SITE_OTHER): Payer: Medicare Other | Admitting: Internal Medicine

## 2019-11-07 ENCOUNTER — Other Ambulatory Visit: Payer: Self-pay

## 2019-11-07 VITALS — BP 144/84 | Ht 72.0 in | Wt 225.0 lb

## 2019-11-07 DIAGNOSIS — E559 Vitamin D deficiency, unspecified: Secondary | ICD-10-CM

## 2019-11-07 DIAGNOSIS — E119 Type 2 diabetes mellitus without complications: Secondary | ICD-10-CM | POA: Diagnosis not present

## 2019-11-07 DIAGNOSIS — I10 Essential (primary) hypertension: Secondary | ICD-10-CM

## 2019-11-07 MED ORDER — LISINOPRIL 5 MG PO TABS: 5.0000 mg | ORAL_TABLET | Freq: Every day | ORAL | 3 refills | Status: DC

## 2019-11-07 NOTE — Progress Notes (Signed)
Virtual Visit via Video Note  I connected with Shannon Chung   on 11/07/19 at 10:40 AM EST by a video enabled telemedicine application and verified that I am speaking with the correct person using two identifiers.  Location patient: home Location provider:work or home office Persons participating in the virtual visit: patient, provider  I discussed the limitations of evaluation and management by telemedicine and the availability of in person appointments. The patient expressed understanding and agreed to proceed.   HPI: 1. F/u doing well other than BP elevated at times 144/84 with weight gain on lis 2.5 mg qd with h/o CAD 2.PSA being monitored by urology Dr. Junious Silk 06/29/19 3.32 saw in 07/2019 and will f/u spring 2021 and then yearly    ROS: See pertinent positives and negatives per HPI. CV: no chest pain  Lungs: no sob   Past Medical History:  Diagnosis Date  . Coronary artery disease    a. 06/2015 Cardiac CT: Ca score 1103 (84th %'ile);  b. 07/2015 Cath: LM 70, LAD 80p, 100/34m D1 70, D2 95, RI 75, RCA 100p/m;  c. 07/2015 CABG x 5 (LIMA->LAD, VG->Diag, VG->OM1->OM2, VG->OM3).  . Diabetes mellitus without complication ([place] 91/6109 . Dyslipidemia   . Essential hypertension   . 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 Fuchs' corneal dystrophy 2016   sees Dr KMaudie Mercury . Fuchs' corneal dystrophy   . GERD (gastroesophageal reflux disease)   . Heart attack ([place]    silent  . Heart disease    history of blood clot in left ventricle per pt   . History of radiation exposure    right vocal cord squamous cell cancer  . Impingement syndrome of right shoulder 10/2015   s/p steroid injection Dr MSabra Heck . Ischemic cardiomyopathy    a. dilated, EF 35% improved to 45-50% (2015);  b. 07/2015 EF 25-35% by LV gram.  . 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 isolated episode, [place]  .Marland KitchenMural thrombus of cardiac apex    a. 06/2014: LV; resolved with coumadin-->no  residual on f/u echo, no longer on coumadin.  . Osteoarthritis    a. R-shoulder, L-knee (Sabra Heckortho)  . Skin cancer    squamous and basal, sees derm regularly Dr. IKellie Moor  . 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 Thrombocytopenia ([place]   . Vitamin D deficiency     Past Surgical History:  Procedure Laterality Date  . BICEPS TENDON REPAIR Right 1993  . CARDIAC CATHETERIZATION N/A 07/05/2015   Procedure: Left Heart Cath and Coronary Angiography;  Surgeon: TMinna Merritts MD;  [facility];  Service: Cardiovascular;  Laterality: N/A;  . CATARACT EXTRACTION Left 12/2016   with keratoplasty  . COLONOSCOPY  2007  . COLONOSCOPY WITH PROPOFOL N/A 12/02/2017   TA, SSA, rpt 3 yrs(Tahiliani, Varnita B, MD)  . CORONARY ARTERY BYPASS GRAFT N/A 07/29/2015   Procedure: CORONARY ARTERY BYPASS GRAFTING (CABG) x 5 (LIMA to LAD, SVG to DIAGONAL, SVG SEQUENTIALLY to OM1 and OM2, SVG to OM3) with Endoscopic Vein Havesting of GREATER SAPHENOUS VEIN from RIGHT THIGH and partial LOWER LEG ;  Surgeon: BGaye Pollack MD;  [facility];  Service: Open Heart Surgery;  Laterality: N/A;  . EYE SURGERY     b/l cataract and cornea replaced   . JOINT REPLACEMENT    .  KNEE ARTHROSCOPY Left remote  . right biceps tendon     repair/re attachment   . SKIN CANCER EXCISION  10/2015   BCC - L ala (pending MOHs) and L scapula (complete excision)  . TEE WITHOUT CARDIOVERSION N/A 07/29/2015   Procedure: TRANSESOPHAGEAL ECHOCARDIOGRAM (TEE);  Surgeon: [name], MD;  Location: [facility];  Service: Open Heart Surgery;  Laterality: N/A;  . TONSILLECTOMY  1949  . TOTAL KNEE ARTHROPLASTY Left 03/18/2016   cemented L TKR; Earnestine Leys, MD    Family History  Problem Relation Age of Onset  . CAD Father 67       MI  . Hypertension Father   . Hyperlipidemia Father   . Alcoholism Father   . Diabetes Father   . Cancer  Daughter        dx'ed 42 retroperitoneal liposarcoma     SOCIAL HX: engaged to be married Jodi Geralds    Current Outpatient Medications:  .  acetaminophen (TYLENOL) 500 MG tablet, Take 500 mg by mouth every 6 (six) hours as needed for mild pain. , Disp: , Rfl:  .  aspirin (ASPIRIN EC) 81 MG EC tablet, Take 162 mg by mouth daily. Swallow whole. , Disp: , Rfl:  .  atorvastatin (LIPITOR) 40 MG tablet, Take 1 tablet (40 mg total) by mouth daily., Disp: 90 tablet, Rfl: 3 .  Cholecalciferol (VITAMIN D) 2000 units CAPS, Take 1 capsule (2,000 Units total) daily by mouth., Disp: 30 capsule, Rfl:  .  ezetimibe (ZETIA) 10 MG tablet, Take 1 tablet (10 mg total) by mouth daily., Disp: 90 tablet, Rfl: 3 .  famotidine (PEPCID) 20 MG tablet, Take 1 tablet (20 mg total) by mouth 2 (two) times daily. Daily to 2x per day. D/c zantac it is discontinued, Disp: 180 tablet, Rfl: 3 .  fluticasone (FLONASE) 50 MCG/ACT nasal spray, Place 1 spray into both nostrils as needed for allergies. Reported on 03/18/2016, Disp: , Rfl:  .  lansoprazole (PREVACID) 30 MG capsule, Take 1 capsule (30 mg total) by mouth every morning., Disp: 90 capsule, Rfl: 3 .  lisinopril (ZESTRIL) 5 MG tablet, Take 1 tablet (5 mg total) by mouth daily., Disp: 90 tablet, Rfl: 3 .  metFORMIN (GLUCOPHAGE) 500 MG tablet, Take 1 tablet (500 mg total) by mouth daily with breakfast., Disp: 90 tablet, Rfl: 3 .  metoprolol succinate (TOPROL-XL) 25 MG 24 hr tablet, TAKE 1 TABLET BY MOUTH EVERY DAY, Disp: 90 tablet, Rfl: 3 .  prednisoLONE acetate (PRED FORTE) 1 % ophthalmic suspension, Place 1 drop into both eyes. , Disp: , Rfl: 3 .  sildenafil (REVATIO) 20 MG tablet, Take 2-4 tablets (40-80 mg total) by mouth daily as needed (relations)., Disp: 30 tablet, Rfl: 11 .  vitamin B-12 (CYANOCOBALAMIN) 500 MCG tablet, Take 500 mcg by mouth daily. Reported on 03/17/2016, Disp: , Rfl:   EXAM:  VITALS per patient if applicable:  GENERAL: alert, oriented, appears  well and in no acute distress  HEENT: atraumatic, conjunttiva clear, no obvious abnormalities on inspection of external nose and ears  NECK: normal movements of the head and neck  LUNGS: on inspection no signs of respiratory distress, breathing rate appears normal, no obvious gross SOB, gasping or wheezing  CV: no obvious cyanosis  MS: moves all visible extremities without noticeable abnormality  PSYCH/NEURO: pleasant and cooperative, no obvious depression or anxiety, speech and thought processing grossly intact  ASSESSMENT AND PLAN:  Discussed the following assessment and plan:  Essential hypertension - Plan:  lisinopril (ZESTRIL) 5 MG tablet inc from 2.5 mg qd, Comprehensive metabolic panel, CBC with Differential/Platelet, Lipid panel  Type 2 diabetes mellitus without complication, without long-term current use of insulin (HCC) - Plan: HgB A1c  Vitamin D deficiency - Plan: Vitamin D (25 hydroxy) rec D3 2000 IU qd   HM Had flu, prevnar, pna 23, Tdap, Zoster, Had 2/2 shingrix 05/05/19 and 2nd dose 07/2019  MMR immune, hep B immune  -Consider hep A vaccine in future h/o fatty liver   Colonoscopy [facility] 12/02/17 rec repeat in 3 years sessile serrated and tubular  F/u dermatology Dr. Kellie Moor h/o [facility] [place] 11 or 10/2018 Former smoker 1 pk/week 1967-1980 no FH lung cancer, no chew  If plts continue to be low  -will rec hematology in future  [facility] seen 11/07/2018 Dr. Junious Silk BPH with LUTS stable PSA in 6 months PSA was 3.07 11/03/18 then 3.32 06/29/19  -saw urology 07/11/2019 and will see spring 2021 then yearly Dr. Junious Silk   Vitamin D3 rec 2000 IU qd   ENT-ask at f/u if he still sees due to h/o right vocal cord [facility]  Saw ENT Dr. 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 Pt prefers to treat via telemedicine empirically rather then risking or undertaking an in person visit at this moment. Patient agrees to seek prompt in person care if worsening, new symptoms arise, or if is not improving with treatment.   I discussed the assessment and treatment plan with the patient. The patient was provided an opportunity to ask questions and all were answered. The patient agreed with the plan and demonstrated an understanding of the instructions.   The patient was advised to call back or seek an in-person evaluation if the symptoms worsen or if the condition fails to improve as anticipated.  Time spent 30-39 minutes  Delorise Jackson, MD

## 2019-12-15 ENCOUNTER — Other Ambulatory Visit: Payer: Medicare Other

## 2019-12-19 ENCOUNTER — Other Ambulatory Visit (INDEPENDENT_AMBULATORY_CARE_PROVIDER_SITE_OTHER): Payer: Medicare Other

## 2019-12-19 ENCOUNTER — Other Ambulatory Visit: Payer: Self-pay

## 2019-12-19 DIAGNOSIS — I10 Essential (primary) hypertension: Secondary | ICD-10-CM

## 2019-12-19 DIAGNOSIS — E559 Vitamin D deficiency, unspecified: Secondary | ICD-10-CM | POA: Diagnosis not present

## 2019-12-19 DIAGNOSIS — E119 Type 2 diabetes mellitus without complications: Secondary | ICD-10-CM

## 2019-12-19 LAB — COMPREHENSIVE METABOLIC PANEL
ALT: 25 U/L (ref 0–53)
AST: 26 U/L (ref 0–37)
Albumin: 4.5 g/dL (ref 3.5–5.2)
Alkaline Phosphatase: 52 U/L (ref 39–117)
BUN: 15 mg/dL (ref 6–23)
CO2: 28 mEq/L (ref 19–32)
Calcium: 9.6 mg/dL (ref 8.4–10.5)
Chloride: 100 mEq/L (ref 96–112)
Creatinine, Ser: 0.93 mg/dL (ref 0.40–1.50)
GFR: 78.92 mL/min (ref >60.00)
Glucose, Bld: 115 mg/dL — ABNORMAL HIGH (ref 70–99)
Potassium: 4.3 mEq/L (ref 3.5–5.1)
Sodium: 136 mEq/L (ref 135–145)
Total Bilirubin: 0.8 mg/dL (ref 0.2–1.2)
Total Protein: 7.6 g/dL (ref 6.0–8.3)

## 2019-12-19 LAB — CBC WITH DIFFERENTIAL/PLATELET
Basophils Absolute: 0 10*3/uL (ref 0.0–0.1)
Basophils Relative: 0.7 % (ref 0.0–3.0)
Eosinophils Absolute: 0.2 10*3/uL (ref 0.0–0.7)
Eosinophils Relative: 3.4 % (ref 0.0–5.0)
HCT: 44.2 % (ref 39.0–52.0)
Hemoglobin: 14.7 g/dL (ref 13.0–17.0)
Lymphocytes Relative: 45.9 % (ref 12.0–46.0)
Lymphs Abs: 2.8 10*3/uL (ref 0.7–4.0)
MCHC: 33.2 g/dL (ref 30.0–36.0)
MCV: 97 fl (ref 78.0–100.0)
Monocytes Absolute: 0.5 10*3/uL (ref 0.1–1.0)
Monocytes Relative: 8.8 % (ref 3.0–12.0)
Neutro Abs: 2.5 10*3/uL (ref 1.4–7.7)
Neutrophils Relative %: 41.2 % — ABNORMAL LOW (ref 43.0–77.0)
Platelets: 151 10*3/uL (ref 150.0–400.0)
RBC: 4.56 Mil/uL (ref 4.22–5.81)
RDW: 13.9 % (ref 11.5–15.5)
WBC: 6.1 10*3/uL (ref 4.0–10.5)

## 2019-12-19 LAB — LIPID PANEL
Cholesterol: 107 mg/dL (ref 0–200)
HDL: 39 mg/dL — ABNORMAL LOW (ref >39.00)
LDL Cholesterol: 45 mg/dL (ref 0–99)
NonHDL: 67.55
Total CHOL/HDL Ratio: 3
Triglycerides: 115 mg/dL (ref 0.0–149.0)
VLDL: 23 mg/dL (ref 0.0–40.0)

## 2019-12-19 LAB — VITAMIN D 25 HYDROXY (VIT D DEFICIENCY, FRACTURES): VITD: 28.7 ng/mL — ABNORMAL LOW (ref 30.00–100.00)

## 2019-12-19 LAB — HEMOGLOBIN A1C: Hgb A1c MFr Bld: 7.3 % — ABNORMAL HIGH (ref 4.6–6.5)

## 2019-12-24 ENCOUNTER — Other Ambulatory Visit: Payer: Self-pay | Admitting: Internal Medicine

## 2019-12-24 DIAGNOSIS — E119 Type 2 diabetes mellitus without complications: Secondary | ICD-10-CM

## 2019-12-24 MED ORDER — METFORMIN HCL 500 MG PO TABS: 500.0000 mg | ORAL_TABLET | Freq: Two times a day (BID) | ORAL | 3 refills | Status: DC

## 2020-02-09 ENCOUNTER — Ambulatory Visit: Payer: Medicare Other | Admitting: Internal Medicine

## 2020-02-27 ENCOUNTER — Ambulatory Visit: Payer: Medicare Other | Admitting: Internal Medicine

## 2020-03-01 DIAGNOSIS — M19049 Primary osteoarthritis, unspecified hand: Secondary | ICD-10-CM | POA: Insufficient documentation

## 2020-03-20 ENCOUNTER — Other Ambulatory Visit: Payer: Self-pay | Admitting: Cardiovascular Disease

## 2020-03-20 ENCOUNTER — Ambulatory Visit: Payer: Medicare Other | Admitting: Internal Medicine

## 2020-04-04 ENCOUNTER — Encounter: Payer: Self-pay | Admitting: Internal Medicine

## 2020-04-04 ENCOUNTER — Other Ambulatory Visit: Payer: Self-pay

## 2020-04-04 ENCOUNTER — Ambulatory Visit: Payer: Medicare Other | Admitting: Internal Medicine

## 2020-04-04 VITALS — BP 130/78 | HR 61 | Temp 97.3°F | Ht 72.01 in | Wt 222.4 lb

## 2020-04-04 DIAGNOSIS — R200 Anesthesia of skin: Secondary | ICD-10-CM

## 2020-04-04 DIAGNOSIS — K219 Gastro-esophageal reflux disease without esophagitis: Secondary | ICD-10-CM | POA: Insufficient documentation

## 2020-04-04 DIAGNOSIS — E782 Mixed hyperlipidemia: Secondary | ICD-10-CM

## 2020-04-04 DIAGNOSIS — I10 Essential (primary) hypertension: Secondary | ICD-10-CM

## 2020-04-04 DIAGNOSIS — E538 Deficiency of other specified B group vitamins: Secondary | ICD-10-CM

## 2020-04-04 DIAGNOSIS — E1159 Type 2 diabetes mellitus with other circulatory complications: Secondary | ICD-10-CM

## 2020-04-04 DIAGNOSIS — Z1329 Encounter for screening for other suspected endocrine disorder: Secondary | ICD-10-CM

## 2020-04-04 DIAGNOSIS — E669 Obesity, unspecified: Secondary | ICD-10-CM

## 2020-04-04 DIAGNOSIS — E559 Vitamin D deficiency, unspecified: Secondary | ICD-10-CM

## 2020-04-04 MED ORDER — FAMOTIDINE 20 MG PO TABS: 20.0000 mg | ORAL_TABLET | Freq: Two times a day (BID) | ORAL | 3 refills | Status: DC | PRN

## 2020-04-04 NOTE — Patient Instructions (Addendum)
Dermatology [place] with Dr. Junious Silk urology appt date  Nerve conduction study/EMG ask Dr. Sabra Heck about this    Carpal Tunnel Syndrome  Carpal tunnel syndrome is a condition that causes pain in your hand and arm. The carpal tunnel is a narrow area located on the palm side of your wrist. Repeated wrist motion or certain diseases may cause swelling within the tunnel. This swelling pinches the main nerve in the wrist (median nerve). What are the causes? This condition may be caused by:  Repeated wrist motions.  Wrist injuries.  Arthritis.  A cyst or tumor in the carpal tunnel.  Fluid buildup during pregnancy. Sometimes the cause of this condition is not known. What increases the risk? The following factors may make you more likely to develop this condition:  Having a job, such as being a Research scientist (life sciences), that requires you to repeatedly move your wrist in the same motion.  Being a woman.  Having certain conditions, such as: ? Diabetes. ? Obesity. ? An underactive thyroid (hypothyroidism). ? Kidney failure. What are the signs or symptoms? Symptoms of this condition include:  A tingling feeling in your fingers, especially in your thumb, index, and middle fingers.  Tingling or numbness in your hand.  An aching feeling in your entire arm, especially when your wrist and elbow are bent for a long time.  Wrist pain that goes up your arm to your shoulder.  Pain that goes down into your palm or fingers.  A weak feeling in your hands. You may have trouble grabbing and holding items. Your symptoms may feel worse during the night. How is this diagnosed? This condition is diagnosed with a medical history and physical exam. You may also have tests, including:  Electromyogram (EMG). This test measures electrical signals sent by your nerves into the muscles.  Nerve conduction study. This test measures how well electrical signals pass through your nerves.  Imaging  tests, such as X-rays, ultrasound, and MRI. These tests check for possible causes of your condition. How is this treated? This condition may be treated with:  Lifestyle changes. It is important to stop or change the activity that caused your condition.  Doing exercise and activities to strengthen your muscles and bones (physical therapy).  Learning how to use your hand again after diagnosis (occupational therapy).  Medicines for pain and inflammation. This may include medicine that is injected into your wrist.  A wrist splint.  Surgery. Follow these instructions at home: If you have a splint:  Wear the splint as told by your health care provider. Remove it only as told by your health care provider.  Loosen the splint if your fingers tingle, become numb, or turn cold and blue.  Keep the splint clean.  If the splint is not waterproof: ? Do not let it get wet. ? Cover it with a watertight covering when you take a bath or shower. Managing pain, stiffness, and swelling   If directed, put ice on the painful area: ? If you have a removable splint, remove it as told by your health care provider. ? Put ice in a plastic bag. ? Place a towel between your skin and the bag. ? Leave the ice on for 20 minutes, 2-3 times per day. General instructions  Take over-the-counter and prescription medicines only as told by your health care provider.  Rest your wrist from any activity that may be causing your pain. If your condition is work related, talk with  your employer about changes that can be made, such as getting a wrist pad to use while typing.  Do any exercises as told by your health care provider, physical therapist, or occupational therapist.  Keep all follow-up visits as told by your health care provider. This is important. Contact a health care provider if:  You have new symptoms.  Your pain is not controlled with medicines.  Your symptoms get worse. Get help right away  if:  You have severe numbness or tingling in your wrist or hand. Summary  Carpal tunnel syndrome is a condition that causes pain in your hand and arm.  It is usually caused by repeated wrist motions.  Lifestyle changes and medicines are used to treat carpal tunnel syndrome. Surgery may be recommended.  Follow your health care provider's instructions about wearing a splint, resting from activity, keeping follow-up visits, and calling for help. This information is not intended to replace advice given to you by your health care provider. Make sure you discuss any questions you have with your health care provider. Document Revised: 02/25/2018 Document Reviewed: 02/25/2018 Elsevier Patient Education  [place].

## 2020-04-04 NOTE — Progress Notes (Signed)
Chief Complaint  Patient presents with  . Follow-up   F/u  1. HTN/HLD on lipitor 40 mg qd and lis 5 mg qd, toprol 25 xl qd  2. C/o left thumb/1st/2nd hand numbness after surgery in 09/2019 Dr. Sabra Heck for trigger finger 1st and 2nd fingers and after surgery had a hard time making a fist and in OT Q 2 weeks and having having to massage and do OT which is helping with ROM. 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 Obesity goal weight is 210 he is working out at the gym  4. GERD controlled on prevacid 30, takes pepcid prn if has [place] food will refill today   Review of Systems  Constitutional: Positive for weight loss.  HENT: Negative for hearing loss.   Eyes: Negative for blurred vision.  Respiratory: Negative for shortness of breath.   Cardiovascular: Negative for chest pain.  Gastrointestinal: Negative for abdominal pain.  Musculoskeletal: Negative for falls.  Skin: Negative for rash.  Neurological: Positive for sensory change.  Psychiatric/Behavioral: Negative for depression and memory loss.   Past Medical History:  Diagnosis Date  . Coronary artery disease    a. 06/2015 Cardiac CT: Ca score 1103 (84th %'ile);  b. 07/2015 Cath: LM 70, LAD 80p, 100/68m D1 70, D2 95, RI 75, RCA 100p/m;  c. 07/2015 CABG x 5 (LIMA->LAD, VG->Diag, VG->OM1->OM2, VG->OM3).  . Diabetes mellitus without complication ([place] 98/7867 . Dyslipidemia   . Essential hypertension   . 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 Fuchs' corneal dystrophy 2016   sees Dr KMaudie Mercury . Fuchs' corneal dystrophy   . GERD (gastroesophageal reflux disease)   . Heart attack ([place]    silent  . Heart disease    history of blood clot in left ventricle per pt   . History of radiation exposure    right vocal cord squamous cell cancer  . Impingement syndrome of right shoulder 10/2015   s/p steroid injection Dr MSabra Heck . Ischemic cardiomyopathy    a. dilated, EF 35% improved to 45-50% (2015);  b. 07/2015 EF 25-35% by  LV gram.  . [place] atrial fibrillation ([place]   a. isolated episode, [place]  .Marland Kitchen[place] thrombus of cardiac apex    a. 06/2014: LV; resolved with coumadin-->no residual on f/u echo, no longer on coumadin.  . Osteoarthritis    a. R-shoulder, L-knee (Sabra Heckortho)  . Skin cancer    squamous and basal, sees derm regularly Dr. IKellie Moor  . 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 Thrombocytopenia ([place]   . Vitamin D deficiency    Past Surgical History:  Procedure Laterality Date  . BICEPS TENDON REPAIR Right 1993  . CARDIAC CATHETERIZATION N/A 07/05/2015   Procedure: Left Heart Cath and Coronary Angiography;  Surgeon: TMinna Merritts MD;  [facility];  Service: Cardiovascular;  Laterality: N/A;  . CATARACT EXTRACTION Left 12/2016   with keratoplasty  . COLONOSCOPY  2007  . COLONOSCOPY WITH PROPOFOL N/A 12/02/2017   TA, SSA, rpt 3 yrs(Tahiliani, Varnita B, MD)  . CORONARY ARTERY BYPASS GRAFT N/A 07/29/2015   Procedure: CORONARY ARTERY BYPASS GRAFTING (CABG) x 5 (LIMA to LAD, SVG to DIAGONAL, SVG SEQUENTIALLY to OM1 and OM2, SVG to OM3) with Endoscopic Vein Havesting of GREATER SAPHENOUS VEIN from RIGHT THIGH and partial LOWER LEG ;  Surgeon: [name], MD;  Location: [facility];  Service: Open Heart Surgery;  Laterality: N/A;  . EYE SURGERY     b/l cataract and cornea replaced   . HAND SURGERY     left hand 1st/2nd trigger fingers Dr. Sabra Heck ortho   . JOINT REPLACEMENT    . KNEE ARTHROSCOPY Left remote  . right biceps tendon     repair/re attachment   . SKIN CANCER EXCISION  10/2015   BCC - L ala (pending MOHs) and L scapula (complete excision)  . TEE WITHOUT CARDIOVERSION N/A 07/29/2015   Procedure: TRANSESOPHAGEAL ECHOCARDIOGRAM (TEE);  Surgeon: [name], MD;  Location: [facility];  Service: Open Heart Surgery;  Laterality: N/A;  . TONSILLECTOMY  1949  . TOTAL KNEE ARTHROPLASTY Left  03/18/2016   cemented L TKR; Earnestine Leys, MD   Family History  Problem Relation Age of Onset  . CAD Father 91       MI  . Hypertension Father   . Hyperlipidemia Father   . Alcoholism Father   . Diabetes Father   . Cancer Daughter        dx'ed 51 retroperitoneal liposarcoma    Social History   Socioeconomic History  . Marital status: Divorced    Spouse name: Not on file  . Number of children: Not on file  . Years of education: Not on file  . Highest education level: Not on file  Occupational History  . Not on file  Tobacco Use  . Smoking status: Former Smoker    Packs/day: 0.50    Years: 10.00    Pack years: 5.00    Types: Cigarettes    Quit date: 11/02/1978    Years since quitting: 41.4  . Smokeless tobacco: Never Used  . Tobacco comment: former smoker [phone] 1 pk/week no FH lung cancer   Substance and Sexual Activity  . Alcohol use: Yes    Alcohol/week: 0.0 standard drinks    Comment: beer/wine on weekends  . Drug use: No  . Sexual activity: Yes  Other Topics Concern  . 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 Difficulty of Paying Living Expenses: Not hard at all  Food Insecurity: No Food Insecurity  . Worried About RCharity fundraiserin the Last Year: Never true  . Ran Out of Food in the Last Year: Never true  Transportation Needs: No Transportation Needs  . Lack of Transportation (Medical): No  . Lack of Transportation (Non-Medical): No  Physical Activity: Unknown  . Days of Exercise per Week: 0 days  . Minutes of Exercise per Session: Not on file  Stress: No Stress Concern Present   . Feeling of Stress : Not at all  Social Connections:   . Frequency of Communication with Friends and Family:   . Frequency of Social Gatherings with Friends and Family:   . Attends Religious Services:   . Active Member of Clubs or Organizations:   . Attends CArchivistMeetings:   .Marland KitchenMarital  Status:   Intimate Partner Violence: Not At Risk  . Fear of Current or Ex-Partner: No  . Emotionally Abused: No  . Physically Abused: No  . Sexually Abused: No   Current Meds  Medication Sig  . acetaminophen (TYLENOL) 500 MG tablet Take 500 mg by mouth every 6 (six) hours as needed for mild pain.   Marland Kitchen amoxicillin (AMOXIL) 500 MG capsule amoxicillin 500 mg capsule  . aspirin (ASPIRIN EC) 81 MG EC tablet Take 162 mg by mouth daily. Swallow whole.   Marland Kitchen atorvastatin (LIPITOR) 40 MG tablet TAKE 1 TABLET BY MOUTH EVERY DAY  . Cholecalciferol (VITAMIN D) 2000 units CAPS Take 1 capsule (2,000 Units total) daily by mouth.  . ezetimibe (ZETIA) 10 MG tablet Take 1 tablet (10 mg total) by mouth daily.  . famotidine (PEPCID) 20 MG tablet Take 1 tablet (20 mg total) by mouth 2 (two) times daily as needed for heartburn or indigestion. Daily to 2x per day prn. D/c zantac it is discontinued  . fluticasone (FLONASE) 50 MCG/ACT nasal spray Place 1 spray into both nostrils as needed for allergies. Reported on 03/18/2016  . lansoprazole (PREVACID) 30 MG capsule Take 1 capsule (30 mg total) by mouth every morning.  Marland Kitchen lisinopril (ZESTRIL) 5 MG tablet Take 1 tablet (5 mg total) by mouth daily.  . metFORMIN (GLUCOPHAGE) 500 MG tablet Take 1 tablet (500 mg total) by mouth 2 (two) times daily with a meal.  . metoprolol succinate (TOPROL-XL) 25 MG 24 hr tablet TAKE 1 TABLET BY MOUTH EVERY DAY  . prednisoLONE acetate (PRED FORTE) 1 % ophthalmic suspension Place 1 drop into both eyes.   . sildenafil (REVATIO) 20 MG tablet Take 2-4 tablets (40-80 mg total) by mouth daily as needed (relations).  . vitamin B-12  (CYANOCOBALAMIN) 500 MCG tablet Take 500 mcg by mouth daily. Reported on 03/17/2016  . [DISCONTINUED] famotidine (PEPCID) 20 MG tablet Take 1 tablet (20 mg total) by mouth 2 (two) times daily. Daily to 2x per day. D/c zantac it is discontinued   Allergies  Allergen Reactions  . Pollen Extract Itching   No results found for this or any previous visit (from the past 2160 hour(s)). Objective  Body mass index is 30.16 kg/m. Wt Readings from Last 3 Encounters:  04/04/20 222 lb 6.4 oz (100.9 kg)  11/07/19 225 lb (102.1 kg)  08/07/19 224 lb 8 oz (101.8 kg)   Temp Readings from Last 3 Encounters:  04/04/20 (!) 97.3 F (36.3 C)  08/07/19 (!) 97.4 F (36.3 C)  08/05/18 99.1 F (37.3 C) (Oral)   BP Readings from Last 3 Encounters:  04/04/20 130/78  11/07/19 (!) 144/84  08/07/19 140/76   Pulse Readings from Last 3 Encounters:  04/04/20 61  08/07/19 73  08/05/18 72    Physical Exam Vitals and nursing note reviewed.  Constitutional:      Appearance: Normal appearance. He is well-developed and well-groomed. He is obese.  HENT:     Head: Normocephalic and atraumatic.  Eyes:     Conjunctiva/sclera: Conjunctivae normal.     Pupils: Pupils are equal, round, and reactive to light.  Cardiovascular:     Rate and Rhythm: Normal rate and regular rhythm.     Heart sounds: Normal heart sounds. No murmur.  Pulmonary:     Effort: Pulmonary effort is normal.     Breath sounds: Normal breath sounds.  Skin:    General: Skin is warm and dry.  Neurological:     General:  No focal deficit present.     Mental Status: He is alert and oriented to person, place, and time. Mental status is at baseline.     Gait: Gait normal.  Psychiatric:        Attention and Perception: Attention and perception normal.        Mood and Affect: Mood and affect normal.        Speech: Speech normal.        Behavior: Behavior normal. Behavior is cooperative.        Thought Content: Thought content normal.         Cognition and Memory: Cognition and memory normal.        Judgment: Judgment normal.     Assessment  Plan  Numbness of left hand s/p trigger finger surgery left hand 1st/2nd fingers Consider EMG/NCS Dr. Sabra Heck to r/o CTS   Gastroesophageal reflux disease - Plan: famotidine (PEPCID) 20 MG tablet Cont prevacid   Essential hypertension - Plan: Comprehensive metabolic panel, Lipid panel, CBC with Differential/Plateletcont meds controlled BP today   Mixed hyperlipidemia Cont meds   Obesity (BMI 30.0-34.9) Trying exercise and healthy eating  Hypertension associated with diabetes ([place]) - Plan: Comprehensive metabolic panel, Lipid panel, CBC with Differential/Platelet, Hemoglobin A1c, Urinalysis, Routine w reflex microscopic, Microalbumin / creatinine urine ratio Cont meds Foot exam at f/u Eye exam due [facility] op last seen 04/20/18  Vitamin D deficiency - Plan: Vitamin D (25 hydroxy) On D3 5000 IU qd   HM Had flu, prevnar, pna 23, Tdap, Zoster, Had 2/2 shingrix 05/05/19 and 2nd dose 07/2019  MMR immune, hep B immune  -Consider hep A vaccine in future h/o fatty liver covid 19 vx 2/2  Colonoscopy [facility] 12/02/17 rec repeat in 3 years sessile serrated and tubular   F/u dermatology Dr. Kellie Moor h/o [facility], [place] 11 or 12/2019did topical chemo in winter 2020 rec 04/04/20  Pt call and schedule appt  Former smoker 1 pk/week 1967-1980 no FH lung cancer, no chew  If plts continue to be low -will rec hematology in future  [facility] seen 11/07/2018 Dr. Junious Silk BPH with LUTS stable PSA in 6 months PSA was 3.07 11/03/18 then 3.32 06/29/19  -saw urology 07/11/2019 and will see July 2021 then yearly Dr. Junious Silk   Vitamin D3 rec 2000 IU qd   ENT-h/o right vocal cord [facility] Saw ENT Dr. Richardson Landry 07/31/19 LPR no recurrence scc vocal cord cancer mild RT changes some mild reflux related to irritation rec PPI pm meals   Provider: Dr. Olivia Mackie [name]-Internal Medicine

## 2020-04-05 ENCOUNTER — Ambulatory Visit: Payer: Medicare Other | Admitting: Internal Medicine

## 2020-06-06 ENCOUNTER — Encounter: Payer: Self-pay | Admitting: Internal Medicine

## 2020-06-07 ENCOUNTER — Other Ambulatory Visit: Payer: Self-pay | Admitting: Internal Medicine

## 2020-06-07 ENCOUNTER — Encounter: Payer: Self-pay | Admitting: Internal Medicine

## 2020-06-07 DIAGNOSIS — G4700 Insomnia, unspecified: Secondary | ICD-10-CM | POA: Insufficient documentation

## 2020-06-07 DIAGNOSIS — F419 Anxiety disorder, unspecified: Secondary | ICD-10-CM

## 2020-06-07 HISTORY — DX: Anxiety disorder, unspecified: F41.9

## 2020-06-07 MED ORDER — CLONAZEPAM 0.5 MG PO TABS: 0.2500 mg | ORAL_TABLET | Freq: Every day | ORAL | 0 refills | Status: DC | PRN

## 2020-07-29 ENCOUNTER — Other Ambulatory Visit: Payer: Self-pay | Admitting: Cardiovascular Disease

## 2020-07-29 ENCOUNTER — Telehealth: Payer: Self-pay | Admitting: Internal Medicine

## 2020-08-01 DIAGNOSIS — K635 Polyp of colon: Secondary | ICD-10-CM

## 2020-08-01 DIAGNOSIS — E559 Vitamin D deficiency, unspecified: Secondary | ICD-10-CM | POA: Insufficient documentation

## 2020-08-05 ENCOUNTER — Encounter: Payer: Self-pay | Admitting: Internal Medicine

## 2020-08-05 ENCOUNTER — Other Ambulatory Visit: Payer: Self-pay | Admitting: Internal Medicine

## 2020-08-05 DIAGNOSIS — N529 Male erectile dysfunction, unspecified: Secondary | ICD-10-CM

## 2020-08-05 MED ORDER — SILDENAFIL CITRATE 20 MG PO TABS: 40.0000 mg | ORAL_TABLET | Freq: Every day | ORAL | 11 refills | Status: DC | PRN

## 2020-08-14 ENCOUNTER — Ambulatory Visit (INDEPENDENT_AMBULATORY_CARE_PROVIDER_SITE_OTHER): Payer: Medicare Other

## 2020-08-14 ENCOUNTER — Ambulatory Visit: Payer: Medicare Other

## 2020-08-14 VITALS — Ht 72.01 in | Wt 215.0 lb

## 2020-08-14 DIAGNOSIS — Z0000 Encounter for general adult medical examination without abnormal findings: Secondary | ICD-10-CM

## 2020-08-14 NOTE — Patient Instructions (Addendum)
Shannon Chung , Thank you for taking time to come for your Medicare Wellness Visit. I appreciate your ongoing commitment to your health goals. Please review the following plan we discussed and let me know if I can assist you in the future.   These are the goals we discussed: Goals    . Follow up with Primary Care Provider     As needed       This is a list of the screening recommended for you and due dates:  Health Maintenance  Topic Date Due  . Complete foot exam   09/15/2018  . Eye exam for diabetics  10/14/2018  . Hemoglobin A1C  06/17/2020  . Colon Cancer Screening  12/02/2020  . Tetanus Vaccine  05/01/2025  . Flu Shot  Completed  . COVID-19 Vaccine  Completed  .  Hepatitis C: One time screening is recommended by Center for Disease Control  (CDC) for  adults born from [birth year] through 1963.   Completed  . Pneumonia vaccines  Completed    Immunizations Immunization History  Administered Date(s) Administered  . Fluad Quad(high Dose 65+) 07/03/2019, 07/27/2020  . Influenza, High Dose Seasonal PF 08/24/2017, 07/28/2018  . Influenza, Seasonal, Injecte, Preservative Fre 07/20/2016  . Influenza,inj,Quad PF,6+ Mos 08/12/2015  . Moderna SARS-COVID-2 Vaccination 11/14/2019, 12/12/2019  . Pneumococcal Conjugate-13 01/08/2014  . Pneumococcal Polysaccharide-23 08/02/2012, 08/01/2015  . Tdap 05/02/2015  . Zoster 11/02/2005  . Zoster Recombinat (Shingrix) 04/15/2018, 05/05/2019   Keep all routine maintenance appointments.   Follow up 10/09/20 @ 2:00  Advanced directives: completed  Conditions/risks identified: none new  Follow up in one year for your annual wellness visit    Preventive Care 65 Years and Older, Male Preventive care refers to lifestyle choices and visits with your health care provider that can promote health and wellness. What does preventive care include?  A yearly physical exam. This is also called an annual well check.  Dental exams once or twice a  year.  Routine eye exams. Ask your health care provider how often you should have your eyes checked.  Personal lifestyle choices, including:  Daily care of your teeth and gums.  Regular physical activity.  Eating a healthy diet.  Avoiding tobacco and drug use.  Limiting alcohol use.  Practicing safe sex.  Taking low-dose aspirin every day.  Taking vitamin and mineral supplements as recommended by your health care provider. What happens during an annual well check? The services and screenings done by your health care provider during your annual well check will depend on your age, overall health, lifestyle risk factors, and family history of disease. Counseling  Your health care provider may ask you questions about your:  Alcohol use.  Tobacco use.  Drug use.  Emotional well-being.  Home and relationship well-being.  Sexual activity.  Eating habits.  History of falls.  Memory and ability to understand (cognition).  Work and work Statistician.  Reproductive health. Screening  You may have the following tests or measurements:  Height, weight, and BMI.  Blood pressure.  Lipid and cholesterol levels. These may be checked every 5 years, or more frequently if you are over 47 years old.  Skin check.  Lung cancer screening. You may have this screening every year starting at age 54 if you have a 30-pack-year history of smoking and currently smoke or have quit within the past 15 years.  Fecal occult blood test (FOBT) of the stool. You may have this test every year starting at age 73.  Flexible sigmoidoscopy or colonoscopy. You may have a sigmoidoscopy every 5 years or a colonoscopy every 10 years starting at age 55.  Hepatitis C blood test.  Hepatitis B blood test.  Sexually transmitted disease (STD) testing.  Diabetes screening. This is done by checking your blood sugar (glucose) after you have not eaten for a while (fasting). You may have this done every 1-3  years.  Bone density scan. This is done to screen for osteoporosis. You may have this done starting at age 27.  Mammogram. This may be done every 1-2 years. Talk to your health care provider about how often you should have regular mammograms. Talk with your health care provider about your test results, treatment options, and if necessary, the need for more tests. Vaccines  Your health care provider may recommend certain vaccines, such as:  Influenza vaccine. This is recommended every year.  Tetanus, diphtheria, and acellular pertussis (Tdap, Td) vaccine. You may need a Td booster every 10 years.  Zoster vaccine. You may need this after age 69.  Pneumococcal 13-valent conjugate (PCV13) vaccine. One dose is recommended after age 54.  Pneumococcal polysaccharide (PPSV23) vaccine. One dose is recommended after age 28. Talk to your health care provider about which screenings and vaccines you need and how often you need them. This information is not intended to replace advice given to you by your health care provider. Make sure you discuss any questions you have with your health care provider. Document Released: 11/15/2015 Document Revised: 07/08/2016 Document Reviewed: 08/20/2015 Elsevier Interactive Patient Education  2017 [place] Prevention in the Home Falls can cause injuries. They can happen to people of all ages. There are many things you can do to make your home safe and to help prevent falls. What can I do on the outside of my home?  Regularly fix the edges of walkways and driveways and fix any cracks.  Remove anything that might make you trip as you walk through a door, such as a raised step or threshold.  Trim any bushes or trees on the path to your home.  Use bright outdoor lighting.  Clear any walking paths of anything that might make someone trip, such as rocks or tools.  Regularly check to see if handrails are loose or broken. Make sure that both sides of any  steps have handrails.  Any raised decks and porches should have guardrails on the edges.  Have any leaves, snow, or ice cleared regularly.  Use sand or salt on walking paths during winter.  Clean up any spills in your garage right away. This includes oil or grease spills. What can I do in the bathroom?  Use night lights.  Install grab bars by the toilet and in the tub and shower. Do not use towel bars as grab bars.  Use non-skid mats or decals in the tub or shower.  If you need to sit down in the shower, use a plastic, non-slip stool.  Keep the floor dry. Clean up any water that spills on the floor as soon as it happens.  Remove soap buildup in the tub or shower regularly.  Attach bath mats securely with double-sided non-slip rug tape.  Do not have throw rugs and other things on the floor that can make you trip. What can I do in the bedroom?  Use night lights.  Make sure that you have a light by your bed that is easy to reach.  Do not use any sheets or blankets  that are too big for your bed. They should not hang down onto the floor.  Have a firm chair that has side arms. You can use this for support while you get dressed.  Do not have throw rugs and other things on the floor that can make you trip. What can I do in the kitchen?  Clean up any spills right away.  Avoid walking on wet floors.  Keep items that you use a lot in easy-to-reach places.  If you need to reach something above you, use a strong step stool that has a grab bar.  Keep electrical cords out of the way.  Do not use floor polish or wax that makes floors slippery. If you must use wax, use non-skid floor wax.  Do not have throw rugs and other things on the floor that can make you trip. What can I do with my stairs?  Do not leave any items on the stairs.  Make sure that there are handrails on both sides of the stairs and use them. Fix handrails that are broken or loose. Make sure that handrails are  as long as the stairways.  Check any carpeting to make sure that it is firmly attached to the stairs. Fix any carpet that is loose or worn.  Avoid having throw rugs at the top or bottom of the stairs. If you do have throw rugs, attach them to the floor with carpet tape.  Make sure that you have a light switch at the top of the stairs and the bottom of the stairs. If you do not have them, ask someone to add them for you. What else can I do to help prevent falls?  Wear shoes that:  Do not have high heels.  Have rubber bottoms.  Are comfortable and fit you well.  Are closed at the toe. Do not wear sandals.  If you use a stepladder:  Make sure that it is fully opened. Do not climb a closed stepladder.  Make sure that both sides of the stepladder are locked into place.  Ask someone to hold it for you, if possible.  Clearly mark and make sure that you can see:  Any grab bars or handrails.  First and last steps.  Where the edge of each step is.  Use tools that help you move around (mobility aids) if they are needed. These include:  Canes.  Walkers.  Scooters.  Crutches.  Turn on the lights when you go into a dark area. Replace any light bulbs as soon as they burn out.  Set up your furniture so you have a clear path. Avoid moving your furniture around.  If any of your floors are uneven, fix them.  If there are any pets around you, be aware of where they are.  Review your medicines with your doctor. Some medicines can make you feel dizzy. This can increase your chance of falling. Ask your doctor what other things that you can do to help prevent falls. This information is not intended to replace advice given to you by your health care provider. Make sure you discuss any questions you have with your health care provider. Document Released: 08/15/2009 Document Revised: 03/26/2016 Document Reviewed: 11/23/2014 Elsevier Interactive Patient Education  2017 [employer].

## 2020-08-14 NOTE — Progress Notes (Signed)
Subjective:   Millan Legan is a 79 y.o. male who presents for Medicare Annual/Subsequent preventive examination.  Review of Systems    No ROS.  Medicare Wellness Virtual Visit.    Cardiac Risk Factors include: advanced age (>32men, >45 women);male gender;diabetes mellitus;hypertension     Objective:    Today's Vitals   08/14/20 1342  Weight: 215 lb (97.5 kg)  Height: 6' 0.01" (1.829 m)   Body mass index is 29.15 kg/m.  Advanced Directives 08/14/2019 12/02/2017 02/18/2017 03/18/2016 03/04/2016 08/28/2015 07/31/2015  Does Patient Have a Medical Advance Directive? Yes Yes Yes Yes Yes Yes Yes  Type of Paramedic of [place];Living will [place];Living will [place];Living will [place];Living will [place];Living will [place];Living will [place];Living will  Does patient want to make changes to medical advance directive? No - Patient declined - - - - No - Patient declined No - Patient declined  Copy of [place] in Chart? Yes - validated most recent copy scanned in chart (See row information) Yes No - copy requested No - copy requested No - copy requested Yes Yes    Current Medications (verified) Outpatient Encounter Medications as of 08/14/2020  Medication Sig  . acetaminophen (TYLENOL) 500 MG tablet Take 500 mg by mouth every 6 (six) hours as needed for mild pain.   Marland Kitchen amoxicillin (AMOXIL) 500 MG capsule amoxicillin 500 mg capsule  . aspirin (ASPIRIN EC) 81 MG EC tablet Take 162 mg by mouth daily. Swallow whole.   Marland Kitchen atorvastatin (LIPITOR) 40 MG tablet TAKE 1 TABLET BY MOUTH EVERY DAY  . Cholecalciferol (VITAMIN D-3) 125 MCG (5000 UT) TABS Take by mouth daily.  . clonazePAM (KLONOPIN) 0.5 MG tablet Take 0.5-1 tablets (0.25-0.5 mg total) by mouth daily as needed for anxiety (insomnia).  . ezetimibe (ZETIA) 10 MG tablet  TAKE 1 TABLET BY MOUTH EVERY DAY  . famotidine (PEPCID) 20 MG tablet Take 1 tablet (20 mg total) by mouth 2 (two) times daily as needed for heartburn or indigestion. Daily to 2x per day prn. D/c zantac it is discontinued  . fluticasone (FLONASE) 50 MCG/ACT nasal spray Place 1 spray into both nostrils as needed for allergies. Reported on 03/18/2016  . lansoprazole (PREVACID) 30 MG capsule Take 1 capsule (30 mg total) by mouth every morning.  Marland Kitchen lisinopril (ZESTRIL) 5 MG tablet Take 1 tablet (5 mg total) by mouth daily.  . metFORMIN (GLUCOPHAGE) 500 MG tablet Take 1 tablet (500 mg total) by mouth 2 (two) times daily with a meal.  . metoprolol succinate (TOPROL-XL) 25 MG 24 hr tablet TAKE 1 TABLET BY MOUTH EVERY DAY  . prednisoLONE acetate (PRED FORTE) 1 % ophthalmic suspension Place 1 drop into both eyes.   . sildenafil (REVATIO) 20 MG tablet Take 2-4 tablets (40-80 mg total) by mouth daily as needed (relations).  . vitamin B-12 (CYANOCOBALAMIN) 500 MCG tablet Take 500 mcg by mouth daily. Reported on 03/17/2016   No facility-administered encounter medications on file as of 08/14/2020.    Allergies (verified) Pollen extract   History: Past Medical History:  Diagnosis Date  . Coronary artery disease    a. 06/2015 Cardiac CT: Ca score 1103 (84th %'ile);  b. 07/2015 Cath: LM 70, LAD 80p, 100/59m, D1 70, D2 95, RI 75, RCA 100p/m;  c. 07/2015 CABG x 5 (LIMA->LAD, VG->Diag, VG->OM1->OM2, VG->OM3).  . Diabetes mellitus without complication ([place]) 07/4495  .  Dyslipidemia   . Essential hypertension   . 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 Fuchs' corneal dystrophy 2016   sees Dr Maudie Mercury  . Fuchs' corneal dystrophy   . GERD (gastroesophageal reflux disease)   . Heart attack ([place])    silent  . Heart disease    history of blood clot in left ventricle per pt   . History of radiation exposure    right vocal cord squamous cell cancer  . Impingement syndrome of right shoulder  10/2015   s/p steroid injection Dr Sabra Heck  . Ischemic cardiomyopathy    a. dilated, EF 35% improved to 45-50% (2015);  b. 07/2015 EF 25-35% by LV gram.  . 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 isolated episode, not on [place].  Marland Kitchen [place] thrombus of cardiac apex    a. 06/2014: LV; resolved with coumadin-->no residual on f/u echo, no longer on coumadin.  . Osteoarthritis    a. R-shoulder, L-knee Sabra Heck ortho)  . Skin cancer    squamous and basal, sees derm regularly Dr. Kellie Moor   . 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 Thrombocytopenia ([place])   . Vitamin D deficiency    Past Surgical History:  Procedure Laterality Date  . BICEPS TENDON REPAIR Right 1993  . CARDIAC CATHETERIZATION N/A 07/05/2015   Procedure: Left Heart Cath and Coronary Angiography;  Surgeon: Minna Merritts, MD;  Location: [facility];  Service: Cardiovascular;  Laterality: N/A;  . CATARACT EXTRACTION Left 12/2016   with keratoplasty  . COLONOSCOPY  2007  . COLONOSCOPY WITH PROPOFOL N/A 12/02/2017   TA, SSA, rpt 3 yrs(Tahiliani, Varnita B, MD)  . CORONARY ARTERY BYPASS GRAFT N/A 07/29/2015   Procedure: CORONARY ARTERY BYPASS GRAFTING (CABG) x 5 (LIMA to LAD, SVG to DIAGONAL, SVG SEQUENTIALLY to OM1 and OM2, SVG to OM3) with Endoscopic Vein Havesting of GREATER SAPHENOUS VEIN from RIGHT THIGH and partial LOWER LEG ;  Surgeon: [name], MD;  Location: [facility];  Service: Open Heart Surgery;  Laterality: N/A;  . EYE SURGERY     b/l cataract and cornea replaced   . HAND SURGERY     left hand 1st/2nd trigger fingers Dr. Sabra Heck ortho   . JOINT REPLACEMENT    . KNEE ARTHROSCOPY Left remote  . right biceps tendon     repair/re attachment   . SKIN CANCER EXCISION  10/2015   BCC - L ala (pending MOHs) and L scapula (complete excision)  . TEE WITHOUT CARDIOVERSION N/A 07/29/2015   Procedure: TRANSESOPHAGEAL ECHOCARDIOGRAM (TEE);   Surgeon: [name], MD;  Location: [facility];  Service: Open Heart Surgery;  Laterality: N/A;  . TONSILLECTOMY  1949  . TOTAL KNEE ARTHROPLASTY Left 03/18/2016   cemented L TKR; Earnestine Leys, MD   Family History  Problem Relation Age of Onset  . CAD Father 105       MI  . Hypertension Father   . Hyperlipidemia Father   . Alcoholism Father   . Diabetes Father   . Cancer Daughter        dx'ed 36 retroperitoneal liposarcoma    Social History   Socioeconomic History  . Marital status: Divorced    Spouse name: Not on file  . Number of children: Not on file  . Years of education: Not on file  . Highest education  level: Not on file  Occupational History  . Not on file  Tobacco Use  . Smoking status: Former Smoker    Packs/day: 0.50    Years: 10.00    Pack years: 5.00    Types: Cigarettes    Quit date: 11/02/1978    Years since quitting: 41.8  . Smokeless tobacco: Never Used  . Tobacco comment: former smoker [phone] 1 pk/week no FH lung cancer   Substance and Sexual Activity  . Alcohol use: Yes    Alcohol/week: 0.0 standard drinks    Comment: beer/wine on weekends  . Drug use: No  . Sexual activity: Yes  Other Topics Concern  . Not on file  Social History Narrative   Lives with fiancee for 30yrs Metta Clines)   Moved from [place] years ago in 2015 to this area    Divorced   Retired Corporate treasurer    Occupation Retired Nurse, mental health   Edu: 1 yr college   Activity: volunteers at [place]: good water, fruits/vegetables daily      Tested at risk for OSA in preop for CABG   Social Determinants of Radio broadcast assistant Strain:   . Difficulty of Paying Living Expenses: Not on file  Food Insecurity:   . Worried About Charity fundraiser in the Last Year: Not on file  . Ran Out of Food in the Last Year: Not on file  Transportation Needs:   . Lack of Transportation (Medical): Not on file  . Lack of Transportation (Non-Medical): Not on file  Physical Activity:   . Days of  Exercise per Week: Not on file  . Minutes of Exercise per Session: Not on file  Stress:   . Feeling of Stress : Not on file  Social Connections:   . Frequency of Communication with Friends and Family: Not on file  . Frequency of Social Gatherings with Friends and Family: Not on file  . Attends Religious Services: Not on file  . Active Member of Clubs or Organizations: Not on file  . Attends Archivist Meetings: Not on file  . Marital Status: Not on file    Tobacco Counseling Counseling given: Not Answered Comment: former smoker [phone] 1 pk/week no FH lung cancer    Clinical Intake:  Pre-visit preparation completed: Yes        Diabetes: Yes (Followed by pcp)  How often do you need to have someone help you when you read instructions, pamphlets, or other written materials from your doctor or pharmacy?: 1 - Never   Interpreter Needed?: No      Activities of Daily Living In your present state of health, do you have any difficulty performing the following activities: 08/14/2020  Hearing? N  Vision? N  Difficulty concentrating or making decisions? N  Walking or climbing stairs? N  Dressing or bathing? N  Doing errands, shopping? N  Preparing Food and eating ? N  Using the Toilet? N  In the past six months, have you accidently leaked urine? N  Do you have problems with loss of bowel control? N  Managing your Medications? N  Managing your Finances? N  Housekeeping or managing your Housekeeping? N  Some recent data might be hidden    Patient Care Team: [name], [name], MD as PCP - General (Internal Medicine) Minna Merritts, MD as Consulting Physician (Cardiology)  Indicate any recent Medical Services you may have received from other than [facility] providers in the past year (date may  be approximate).     Assessment:   This is a routine wellness examination for Carlyn.  I connected with Rod today by telephone and verified that I am speaking with  the correct person using two identifiers. Location patient: home Location provider: work Persons participating in the virtual visit: patient, Marine scientist.    I discussed the limitations, risks, security and privacy concerns of performing an evaluation and management service by telephone and the availability of in person appointments. The patient expressed understanding and verbally consented to this telephonic visit.    Interactive audio and video telecommunications were attempted between this provider and patient, however failed, due to patient having technical difficulties OR patient did not have access to video capability.  We continued and completed visit with audio only.  Some vital signs may be absent or patient reported.   Hearing/Vision screen  Hearing Screening   125Hz  250Hz  500Hz  1000Hz  2000Hz  3000Hz  4000Hz  6000Hz  8000Hz   Right ear:           Left ear:           Comments: Patient is able to hear conversational tones without difficulty. No issues reported  Vision Screening Comments: Wears corrective lenses Visual acuity not assessed, virtual visit  Dietary issues and exercise activities discussed: Current Exercise Habits: Home exercise routine, Intensity: Mild  Healthy diet Good water intake  Goals    . Follow up with Primary Care Provider     As needed      Depression Screen PHQ 2/9 Scores 08/14/2020 04/04/2020 11/07/2019 08/14/2019 08/05/2018 02/10/2018 02/18/2017  PHQ - 2 Score 0 0 0 0 0 0 0  PHQ- 9 Score - - - - - - -    Fall Risk Fall Risk  08/14/2020 04/04/2020 11/07/2019 08/14/2019 08/05/2018  Falls in the past year? 0 0 0 0 No  Number falls in past yr: 0 0 - - -  Injury with Fall? 0 0 - - -  Follow up Falls evaluation completed Falls evaluation completed - - -   Handrails in use when climbing stairs? Yes Home free of loose throw rugs in walkways, pet beds, electrical cords, etc? Yes  Adequate lighting in your home to reduce risk of falls? Yes   ASSISTIVE DEVICES UTILIZED  TO PREVENT FALLS: Use of a cane, walker or w/c? No   TIMED UP AND GO: Was the test performed? No . Virtual visit.   Cognitive Function: Patient is alert and oriented x3.  Denies difficulty focusing, making decisions, memory loss.  Enjoys computer work and brain challenging activities.   MMSE - Mini Mental State Exam 02/18/2017  Orientation to time 5  Orientation to Place 5  Registration 3  Attention/ Calculation 0  Recall 3  Language- name 2 objects 0  Language- repeat 1  Language- follow 3 step command 3  Language- read & follow direction 0  Write a sentence 0  Copy design 0  Total score 20     6CIT Screen 08/14/2019  What Year? 0 points  What month? 0 points  What time? 0 points  Count back from 20 0 points  Months in reverse 0 points  Repeat phrase 0 points  Total Score 0    Immunizations Immunization History  Administered Date(s) Administered  . Fluad Quad(high Dose 65+) 07/03/2019, 07/27/2020  . Influenza, High Dose Seasonal PF 08/24/2017, 07/28/2018  . Influenza, Seasonal, Injecte, Preservative Fre 07/20/2016  . Influenza,inj,Quad PF,6+ Mos 08/12/2015  . Moderna SARS-COVID-2 Vaccination 11/14/2019, 12/12/2019  . Pneumococcal  Conjugate-13 01/08/2014  . Pneumococcal Polysaccharide-23 08/02/2012, 08/01/2015  . Tdap 05/02/2015  . Zoster 11/02/2005  . Zoster Recombinat (Shingrix) 04/15/2018, 05/05/2019    Health Maintenance Health Maintenance  Topic Date Due  . FOOT EXAM  09/15/2018  . OPHTHALMOLOGY EXAM  10/14/2018  . HEMOGLOBIN A1C  06/17/2020  . COLONOSCOPY  12/02/2020  . TETANUS/TDAP  05/01/2025  . INFLUENZA VACCINE  Completed  . COVID-19 Vaccine  Completed  . Hepatitis C Screening  Completed  . PNA vac Low Risk Adult  Completed     Dental Screening: Recommended annual dental exams for proper oral hygiene.  Community Resource Referral / Chronic Care Management: CRR required this visit?  No   CCM required this visit?  No      Plan:   Keep  all routine maintenance appointments.   Follow up 10/09/20 @ 2:00  I have personally reviewed and noted the following in the patient's chart:   . Medical and social history . Use of alcohol, tobacco or illicit drugs  . Current medications and supplements . Functional ability and status . Nutritional status . Physical activity . Advanced directives . List of other physicians . Hospitalizations, surgeries, and ER visits in previous 12 months . Vitals . Screenings to include cognitive, depression, and falls . Referrals and appointments  In addition, I have reviewed and discussed with patient certain preventive protocols, quality metrics, and best practice recommendations. A written personalized care plan for preventive services as well as general preventive health recommendations were provided to patient via mychart.     Varney Biles, LPN   [phone]

## 2020-08-15 ENCOUNTER — Other Ambulatory Visit: Payer: Self-pay | Admitting: Cardiovascular Disease

## 2020-09-03 ENCOUNTER — Ambulatory Visit: Payer: Medicare Other | Admitting: Cardiovascular Disease

## 2020-09-05 ENCOUNTER — Other Ambulatory Visit: Payer: Self-pay | Admitting: Cardiovascular Disease

## 2020-09-05 NOTE — Telephone Encounter (Signed)
error 

## 2020-09-26 ENCOUNTER — Other Ambulatory Visit: Payer: Self-pay | Admitting: Cardiovascular Disease

## 2020-09-29 NOTE — Progress Notes (Signed)
Cardiology Office Note  Date:  09/30/2020   ID:  Shannon Chung, DOB May 18, 1941, MRN [id number]  PCP:  [name], Shannon Glow, MD   Chief Complaint  Patient presents with  . 1 year routine follow up    HPI:  Shannon Chung is a 79 year old male with  squamous cell carcinoma of the hypopharynx, status post radiation coronary artery disease,   severe multivessel disease, with CABG at [facility] August 2016,  ischemic cardiomyopathy, initial ejection fraction in July 2015 of 35%, up to 55% after CABG cardiac PET scan showing scar in the apical and periapical region,  mural thrombus seen in July 2015, started on anticoagulation,  hyperlipidemia,   who presents for routine follow-up of his coronary artery disease   Doing well Throat "good", followed by ENT  Going to gym at [facility], not as much as he could Wife goes quite frequently  Continues to have PVCs , but no sx on metoprolol  Denies any chest pain concerning for angina No significant shortness of breath on exertion  Labs reviewed HAB1C 7.3 has lost weight since then LDL 67  EKG personally reviewed by myself on todays visit Shows normal sinus rhythm consider old anterior MI, old inferior MI, rate 67 bpm  Other past medical history reviewed Echocardiogram showing ejection fraction greater than 55% (improved after revascularization)   total left knee replacement by Shannon Chung   Pet Viability study showed scar in the periapical region, no hibernating myocardium, no ischemia, ejection fraction estimated at 45% Repeat echocardiogram November 2015 showing ejection fraction up to 45%, report suggesting no residual thrombus   PMH:   has a past medical history of Coronary artery disease, Diabetes mellitus without complication ([place]) (06/3661), Dyslipidemia, Essential hypertension, 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  PSH:    Past Surgical History:  Procedure Laterality Date  . BICEPS TENDON REPAIR Right 1993  . CARDIAC CATHETERIZATION N/A 07/05/2015   Procedure: Left Heart Cath and Coronary Angiography;  Surgeon: Shannon Merritts, MD;  Location: [facility];  Service: Cardiovascular;  Laterality: N/A;  . CATARACT EXTRACTION Left 12/2016   with keratoplasty  . COLONOSCOPY  2007  . COLONOSCOPY WITH PROPOFOL N/A 12/02/2017   TA, SSA, rpt 3 yrs(Tahiliani, Shannon B, MD)  . CORONARY ARTERY BYPASS GRAFT N/A 07/29/2015   Procedure: CORONARY ARTERY BYPASS GRAFTING (CABG) x 5 (LIMA to LAD, SVG to DIAGONAL, SVG SEQUENTIALLY to OM1 and OM2, SVG to OM3) with Endoscopic Vein Havesting of GREATER SAPHENOUS VEIN from RIGHT THIGH and partial LOWER LEG ;  Surgeon: Shannon Pollack, MD;  Location: [facility];  Service: Open Heart Surgery;  Laterality: N/A;  . EYE SURGERY     Chung/l cataract and cornea replaced   . HAND SURGERY     left hand 1st/2nd trigger fingers Shannon Chung   . JOINT REPLACEMENT    . KNEE ARTHROSCOPY Left remote  . right biceps tendon     repair/re attachment   . SKIN CANCER EXCISION  10/2015   BCC - L ala (pending MOHs) and L scapula (complete excision)  . TEE WITHOUT CARDIOVERSION N/A 07/29/2015   Procedure: TRANSESOPHAGEAL ECHOCARDIOGRAM (TEE);  Surgeon:  Shannon Pollack, MD;  Location: [facility];  Service: Open Heart Surgery;  Laterality: N/A;  . TONSILLECTOMY  1949  . TOTAL KNEE ARTHROPLASTY Left 03/18/2016   cemented L TKR; Shannon Leys, MD    Current Outpatient Medications  Medication Sig Dispense Refill  . acetaminophen (TYLENOL) 500 MG tablet Take 500  mg by mouth every 6 (six) hours as needed for mild pain.     Marland Kitchen amoxicillin (AMOXIL) 500 MG capsule amoxicillin 500 mg capsule    . aspirin (ASPIRIN EC) 81 MG EC tablet Take 162 mg by mouth daily. Swallow whole.     Marland Kitchen atorvastatin (LIPITOR) 40 MG tablet TAKE 1 TABLET BY MOUTH EVERY DAY 90 tablet 0  . Cholecalciferol (VITAMIN D-3) 125 MCG (5000 UT) TABS Take by mouth daily.    Marland Kitchen ezetimibe (ZETIA) 10 MG tablet TAKE 1 TABLET BY MOUTH EVERY DAY 60 tablet 0  . famotidine (PEPCID) 20 MG tablet Take 1 tablet (20 mg total) by mouth 2 (two) times daily as needed for heartburn or indigestion. Daily to 2x per day prn. D/c zantac it is discontinued 180 tablet 3  . fluticasone (FLONASE) 50 MCG/ACT nasal spray Place 1 spray into both nostrils as needed for allergies. Reported on 03/18/2016    . lansoprazole (PREVACID) 30 MG capsule Take 1 capsule (30 mg total) by mouth every morning. 90 capsule 3  . lisinopril (ZESTRIL) 5 MG tablet Take 1 tablet (5 mg total) by mouth daily. 90 tablet 3  . metFORMIN (GLUCOPHAGE) 500 MG tablet Take 1 tablet (500 mg total) by mouth 2 (two) times daily with a meal. 180 tablet 3  . metoprolol succinate (TOPROL-XL) 25 MG 24 hr tablet TAKE 1 TABLET BY MOUTH EVERY DAY 90 tablet 0  . prednisoLONE acetate (PRED FORTE) 1 % ophthalmic suspension Place 1 drop into both eyes once daily    . sildenafil (REVATIO) 20 MG tablet Take 2-4 tablets (40-80 mg total) by mouth daily as needed (relations). 30 tablet 11  . vitamin Chung-12 (CYANOCOBALAMIN) 500 MCG tablet Take 500 mcg by mouth daily. Reported on 03/17/2016     No current facility-administered medications for this visit.     Allergies:   Pollen extract   Social History:  The patient  reports that he quit smoking about 41 years ago. His smoking use included cigarettes. He has a 5.00 pack-year smoking history. He has never used smokeless tobacco. He reports current alcohol use. He reports that he does not use drugs.   Family History:   family  history includes Alcoholism in his father; CAD (age of onset: 20) in his father; Cancer in his daughter; Diabetes in his father; Hyperlipidemia in his father; Hypertension in his father.    Review of Systems: Review of Systems  Constitutional: Negative.   HENT: Negative.   Respiratory: Negative.   Cardiovascular: Negative.   Gastrointestinal: Negative.   Musculoskeletal: Negative.   Neurological: Negative.   Psychiatric/Behavioral: Negative.   All other systems reviewed and are negative.   PHYSICAL EXAM: VS:  BP 118/72 (BP Location: Left Arm, Patient Position: Sitting, Cuff Size: Normal)   Pulse 67   Ht 6' (1.829 m)   Wt 214 lb (97.1 kg)   SpO2 97%   BMI 29.02 kg/m  , BMI Body mass index is 29.02 kg/m.  Constitutional:  oriented to person, place, and time. No distress.  HENT:  Head: Grossly normal Eyes:  no discharge. No scleral icterus.  Neck: No JVD, no carotid bruits  Cardiovascular: Regular rate and rhythm, no murmurs appreciated Pulmonary/Chest: Clear to auscultation bilaterally, no wheezes or rails Abdominal: Soft.  no distension.  no tenderness.  Musculoskeletal: Normal range of motion Neurological:  normal muscle tone. Coordination normal. No atrophy Skin: Skin warm and dry Psychiatric: normal affect, pleasant  Recent Labs: 12/19/2019: ALT 25; BUN 15; Creatinine, Ser 0.93; Hemoglobin 14.7; Platelets 151.0; Potassium 4.3; Sodium 136    Lipid Panel Lab Results  Component Value Date   CHOL 107 12/19/2019   HDL 39.00 (L) 12/19/2019   LDLCALC 45 12/19/2019   TRIG 115.0 12/19/2019    Wt Readings from Last 3 Encounters:  09/30/20 214 lb (97.1 kg)  08/14/20 215 lb (97.5 kg)  04/04/20 222 lb 6.4 oz (100.9 kg)     ASSESSMENT AND PLAN:  Ischemic cardiomyopathy - Plan: EKG 12-Lead  euvolemic, EF 55% No changes to meds  Mixed hyperlipidemia - Plan: EKG 12-Lead Goal LDL 60 or less Continue atorvastatin, Zetia  Essential hypertension - Plan: EKG  12-Lead Blood pressure is well controlled on today's visit. No changes made to the medications.  Hx of CABG - Plan: EKG 12-Lead Currently with no symptoms of angina. No further workup at this time. Continue current medication regimen.  PVCs  No symptoms, stay on metoprolol  Obesity weight trending down   Total encounter time more than 25 minutes  Greater than 50% was spent in counseling and coordination of care with the patient    No orders of the defined types were placed in this encounter.    Signed, Esmond Plants, M.D., Ph.D. 09/30/2020  [place], [place]

## 2020-09-30 ENCOUNTER — Ambulatory Visit: Payer: Medicare Other | Admitting: Cardiovascular Disease

## 2020-09-30 ENCOUNTER — Encounter: Payer: Self-pay | Admitting: Cardiovascular Disease

## 2020-09-30 ENCOUNTER — Other Ambulatory Visit: Payer: Self-pay

## 2020-09-30 VITALS — BP 118/72 | HR 67 | Ht 72.0 in | Wt 214.0 lb

## 2020-09-30 DIAGNOSIS — E782 Mixed hyperlipidemia: Secondary | ICD-10-CM

## 2020-09-30 DIAGNOSIS — Z951 Presence of aortocoronary bypass graft: Secondary | ICD-10-CM | POA: Diagnosis not present

## 2020-09-30 DIAGNOSIS — I10 Essential (primary) hypertension: Secondary | ICD-10-CM

## 2020-09-30 DIAGNOSIS — R7303 Prediabetes: Secondary | ICD-10-CM

## 2020-09-30 DIAGNOSIS — I25118 Atherosclerotic heart disease of native coronary artery with other forms of angina pectoris: Secondary | ICD-10-CM | POA: Diagnosis not present

## 2020-09-30 DIAGNOSIS — I255 Ischemic cardiomyopathy: Secondary | ICD-10-CM | POA: Diagnosis not present

## 2020-09-30 NOTE — Patient Instructions (Signed)
Medication Instructions:  No changes  If you need a refill on your cardiac medications before your next appointment, please call your pharmacy.    Lab work: No new labs needed   If you have labs (blood work) drawn today and your tests are completely normal, you will receive your results only by: . MyChart Message (if you have MyChart) OR . A paper copy in the mail If you have any lab test that is abnormal or we need to change your treatment, we will call you to review the results.   Testing/Procedures: No new testing needed   Follow-Up: At [facility], you and your health needs are our priority.  As part of our continuing mission to provide you with exceptional heart care, we have created designated Provider Care Teams.  These Care Teams include your primary Cardiologist (physician) and Advanced Practice Providers (APPs -  Physician Assistants and Nurse Practitioners) who all work together to provide you with the care you need, when you need it.  . You will need a follow up appointment in 12 months  . Providers on your designated Care Team:   . Christopher Berge, NP . [place] Dunn, PA-C . Jacquelyn Visser, PA-C  Any Other Special Instructions Will Be Listed Below (If Applicable).  COVID-19 Vaccine Information can be found at: [email or web address] For questions related to vaccine distribution or appointments, please email [email or web address] or call [phone].     

## 2020-10-01 ENCOUNTER — Other Ambulatory Visit (INDEPENDENT_AMBULATORY_CARE_PROVIDER_SITE_OTHER): Payer: Medicare Other

## 2020-10-01 ENCOUNTER — Other Ambulatory Visit: Payer: Self-pay

## 2020-10-01 DIAGNOSIS — I152 Hypertension secondary to endocrine disorders: Secondary | ICD-10-CM | POA: Diagnosis not present

## 2020-10-01 DIAGNOSIS — I10 Essential (primary) hypertension: Secondary | ICD-10-CM | POA: Diagnosis not present

## 2020-10-01 DIAGNOSIS — E559 Vitamin D deficiency, unspecified: Secondary | ICD-10-CM | POA: Diagnosis not present

## 2020-10-01 DIAGNOSIS — E538 Deficiency of other specified B group vitamins: Secondary | ICD-10-CM

## 2020-10-01 DIAGNOSIS — Z1329 Encounter for screening for other suspected endocrine disorder: Secondary | ICD-10-CM

## 2020-10-01 DIAGNOSIS — E1159 Type 2 diabetes mellitus with other circulatory complications: Secondary | ICD-10-CM | POA: Diagnosis not present

## 2020-10-01 LAB — CBC WITH DIFFERENTIAL/PLATELET
Basophils Absolute: 0.1 10*3/uL (ref 0.0–0.1)
Basophils Relative: 0.8 % (ref 0.0–3.0)
Eosinophils Absolute: 0.1 10*3/uL (ref 0.0–0.7)
Eosinophils Relative: 1.5 % (ref 0.0–5.0)
HCT: 44 % (ref 39.0–52.0)
Hemoglobin: 14.6 g/dL (ref 13.0–17.0)
Lymphocytes Relative: 31.9 % (ref 12.0–46.0)
Lymphs Abs: 2.6 10*3/uL (ref 0.7–4.0)
MCHC: 33.3 g/dL (ref 30.0–36.0)
MCV: 96.7 fl (ref 78.0–100.0)
Monocytes Absolute: 0.6 10*3/uL (ref 0.1–1.0)
Monocytes Relative: 7.8 % (ref 3.0–12.0)
Neutro Abs: 4.8 10*3/uL (ref 1.4–7.7)
Neutrophils Relative %: 58 % (ref 43.0–77.0)
Platelets: 153 10*3/uL (ref 150.0–400.0)
RBC: 4.55 Mil/uL (ref 4.22–5.81)
RDW: 13.7 % (ref 11.5–15.5)
WBC: 8.2 10*3/uL (ref 4.0–10.5)

## 2020-10-01 LAB — LIPID PANEL
Cholesterol: 109 mg/dL (ref 0–200)
HDL: 47.5 mg/dL (ref >39.00)
LDL Cholesterol: 41 mg/dL (ref 0–99)
NonHDL: 61.64
Total CHOL/HDL Ratio: 2
Triglycerides: 104 mg/dL (ref 0.0–149.0)
VLDL: 20.8 mg/dL (ref 0.0–40.0)

## 2020-10-01 LAB — COMPREHENSIVE METABOLIC PANEL
ALT: 13 U/L (ref 0–53)
AST: 17 U/L (ref 0–37)
Albumin: 4.4 g/dL (ref 3.5–5.2)
Alkaline Phosphatase: 52 U/L (ref 39–117)
BUN: 20 mg/dL (ref 6–23)
CO2: 30 mEq/L (ref 19–32)
Calcium: 9.5 mg/dL (ref 8.4–10.5)
Chloride: 100 mEq/L (ref 96–112)
Creatinine, Ser: 0.99 mg/dL (ref 0.40–1.50)
GFR: 73.67 mL/min (ref >60.00)
Glucose, Bld: 112 mg/dL — ABNORMAL HIGH (ref 70–99)
Potassium: 4.5 mEq/L (ref 3.5–5.1)
Sodium: 136 mEq/L (ref 135–145)
Total Bilirubin: 0.7 mg/dL (ref 0.2–1.2)
Total Protein: 7.2 g/dL (ref 6.0–8.3)

## 2020-10-01 LAB — TSH: TSH: 1.64 u[IU]/mL (ref 0.35–4.50)

## 2020-10-01 LAB — VITAMIN B12: Vitamin B-12: 307 pg/mL (ref 211–911)

## 2020-10-01 LAB — HEMOGLOBIN A1C: Hgb A1c MFr Bld: 6.5 % (ref 4.6–6.5)

## 2020-10-01 LAB — VITAMIN D 25 HYDROXY (VIT D DEFICIENCY, FRACTURES): VITD: 46.9 ng/mL (ref 30.00–100.00)

## 2020-10-02 ENCOUNTER — Telehealth (INDEPENDENT_AMBULATORY_CARE_PROVIDER_SITE_OTHER): Payer: Self-pay | Admitting: Gastroenterology

## 2020-10-02 DIAGNOSIS — Z86010 Personal history of colonic polyps: Secondary | ICD-10-CM

## 2020-10-02 LAB — URINALYSIS, ROUTINE W REFLEX MICROSCOPIC
Bilirubin Urine: NEGATIVE
Glucose, UA: NEGATIVE
Hgb urine dipstick: NEGATIVE
Ketones, ur: NEGATIVE
Leukocytes,Ua: NEGATIVE
Nitrite: NEGATIVE
Protein, ur: NEGATIVE
Specific Gravity, Urine: 1.015 (ref 1.001–1.03)
pH: 5.5 (ref 5.0–8.0)

## 2020-10-02 LAB — MICROALBUMIN / CREATININE URINE RATIO
Creatinine, Urine: 91 mg/dL (ref 20–320)
Microalb Creat Ratio: 10 mcg/mg creat (ref <30)
Microalb, Ur: 0.9 mg/dL

## 2020-10-02 MED ORDER — NA SULFATE-K SULFATE-MG SULF 17.5-3.13-1.6 GM/177ML PO SOLN: 1.0000 | Freq: Once | ORAL | 0 refills | Status: AC

## 2020-10-02 NOTE — Progress Notes (Signed)
Gastroenterology Pre-Procedure Review  Request Date: Wed 11/13/20 Requesting Physician: Dr. Bonna Gains  PATIENT REVIEW QUESTIONS: The patient responded to the following health history questions as indicated:    1. Are you having any GI issues? no 2. Do you have a personal history of Polyps? yes (12/02/17 performed by Dr. Bonna Gains) 3. Do you have a family history of Colon Cancer or Polyps? no  4. Diabetes Mellitus? yes 5. Joint replacements in the past 12 months?no 6. Major health problems in the past 3 months?no 7. Any artificial heart valves, MVP, or defibrillator?no    MEDICATIONS & ALLERGIES:    Patient reports the following regarding taking any anticoagulation/antiplatelet therapy:   Plavix, Coumadin, Eliquis, Xarelto, Lovenox, Pradaxa, Brilinta, or Effient? no Aspirin? yes (81 mg daily)  Patient confirms/reports the following medications:  Current Outpatient Medications  Medication Sig Dispense Refill  . acetaminophen (TYLENOL) 500 MG tablet Take 500 mg by mouth every 6 (six) hours as needed for mild pain.     Marland Kitchen amoxicillin (AMOXIL) 500 MG capsule amoxicillin 500 mg capsule    . aspirin (ASPIRIN EC) 81 MG EC tablet Take 162 mg by mouth daily. Swallow whole.     Marland Kitchen atorvastatin (LIPITOR) 40 MG tablet TAKE 1 TABLET BY MOUTH EVERY DAY 90 tablet 0  . Cholecalciferol (VITAMIN D-3) 125 MCG (5000 UT) TABS Take by mouth daily.    Marland Kitchen ezetimibe (ZETIA) 10 MG tablet TAKE 1 TABLET BY MOUTH EVERY DAY 90 tablet 3  . famotidine (PEPCID) 20 MG tablet Take 1 tablet (20 mg total) by mouth 2 (two) times daily as needed for heartburn or indigestion. Daily to 2x per day prn. D/c zantac it is discontinued 180 tablet 3  . fluticasone (FLONASE) 50 MCG/ACT nasal spray Place 1 spray into both nostrils as needed for allergies. Reported on 03/18/2016    . lansoprazole (PREVACID) 30 MG capsule Take 1 capsule (30 mg total) by mouth every morning. 90 capsule 3  . lisinopril (ZESTRIL) 5 MG tablet Take 1 tablet (5  mg total) by mouth daily. 90 tablet 3  . metFORMIN (GLUCOPHAGE) 500 MG tablet Take 1 tablet (500 mg total) by mouth 2 (two) times daily with a meal. 180 tablet 3  . metoprolol succinate (TOPROL-XL) 25 MG 24 hr tablet TAKE 1 TABLET BY MOUTH EVERY DAY 90 tablet 0  . prednisoLONE acetate (PRED FORTE) 1 % ophthalmic suspension Place 1 drop into both eyes once daily    . sildenafil (REVATIO) 20 MG tablet Take 2-4 tablets (40-80 mg total) by mouth daily as needed (relations). 30 tablet 11  . vitamin B-12 (CYANOCOBALAMIN) 500 MCG tablet Take 500 mcg by mouth daily. Reported on 03/17/2016     No current facility-administered medications for this visit.    Patient confirms/reports the following allergies:  Allergies  Allergen Reactions  . Pollen Extract Itching    No orders of the defined types were placed in this encounter.   AUTHORIZATION INFORMATION Primary Insurance: 1D#: Group #:  Secondary Insurance: 1D#: Group #:  SCHEDULE INFORMATION: Date: Wed 11/14/19 Time: Location:[facility]

## 2020-10-04 ENCOUNTER — Encounter: Payer: Self-pay | Admitting: Internal Medicine

## 2020-10-04 ENCOUNTER — Ambulatory Visit: Payer: Medicare Other | Admitting: Internal Medicine

## 2020-10-04 ENCOUNTER — Other Ambulatory Visit: Payer: Self-pay

## 2020-10-04 ENCOUNTER — Telehealth: Payer: Self-pay | Admitting: Gastroenterology

## 2020-10-04 VITALS — BP 128/84 | HR 61 | Temp 97.6°F | Ht 72.0 in | Wt 213.8 lb

## 2020-10-04 DIAGNOSIS — F4321 Adjustment disorder with depressed mood: Secondary | ICD-10-CM

## 2020-10-04 DIAGNOSIS — F4323 Adjustment disorder with mixed anxiety and depressed mood: Secondary | ICD-10-CM

## 2020-10-04 DIAGNOSIS — C4492 Squamous cell carcinoma of skin, unspecified: Secondary | ICD-10-CM | POA: Diagnosis not present

## 2020-10-04 DIAGNOSIS — E1159 Type 2 diabetes mellitus with other circulatory complications: Secondary | ICD-10-CM

## 2020-10-04 DIAGNOSIS — I152 Hypertension secondary to endocrine disorders: Secondary | ICD-10-CM

## 2020-10-04 DIAGNOSIS — E663 Overweight: Secondary | ICD-10-CM

## 2020-10-04 DIAGNOSIS — I10 Essential (primary) hypertension: Secondary | ICD-10-CM | POA: Diagnosis not present

## 2020-10-04 DIAGNOSIS — E119 Type 2 diabetes mellitus without complications: Secondary | ICD-10-CM

## 2020-10-04 MED ORDER — METFORMIN HCL 500 MG PO TABS: 500.0000 mg | ORAL_TABLET | Freq: Two times a day (BID) | ORAL | 3 refills | Status: DC

## 2020-10-04 MED ORDER — CLONAZEPAM 0.5 MG PO TABS: 0.2500 mg | ORAL_TABLET | Freq: Every day | ORAL | 2 refills | Status: DC | PRN

## 2020-10-04 MED ORDER — LISINOPRIL 5 MG PO TABS: 5.0000 mg | ORAL_TABLET | Freq: Every day | ORAL | 3 refills | Status: DC

## 2020-10-04 NOTE — Patient Instructions (Addendum)
Results for Shannon Chung, Shannon Chung (MRN [id number]) as of 10/04/2020 13:56  Ref. Range 12/19/2019 11:03 10/01/2020 10:00  Hemoglobin A1C Latest Ref Range: 4.6 - 6.5 % 7.3 (H) 6.5   [place] therapist here [employer]

## 2020-10-04 NOTE — Progress Notes (Signed)
Patient requested a new procedure date. Procedure has been scheduled and new instructions have been sent.

## 2020-10-04 NOTE — Progress Notes (Signed)
Chief Complaint  Patient presents with  . Follow-up   F/u  1. Grief after death of 1/2 of his daughters and he would like refill of klonopin 0.5 taking 1/2 pill qhs prn for anxiety  2. 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 HTN on lis 5 mg qd and toprol xl 25 mg qd  4. H/o SCC vocal cord and saw ENT 08/12/20 with no evidence of recurrence  5. DM 2 A1C 10/01/20 6.5 on metformin 500 mg bid    Review of Systems  Constitutional: Negative for weight loss.  HENT: Negative for hearing loss.   Eyes: Negative for blurred vision.  Respiratory: Negative for shortness of breath.   Cardiovascular: Negative for chest pain.  Gastrointestinal: Negative for abdominal pain.  Skin:       +SCC left cheek  Psychiatric/Behavioral: The patient is nervous/anxious.        +grief     Past Medical History:  Diagnosis Date  . Coronary artery disease    a. 06/2015 Cardiac CT: Ca score 1103 (84th %'ile);  b. 07/2015 Cath: LM 70, LAD 80p, 100/33m D1 70, D2 95, RI 75, RCA 100p/m;  c. 07/2015 CABG x 5 (LIMA->LAD, VG->Diag, VG->OM1->OM2, VG->OM3).  . Diabetes mellitus without complication ([place] 98/1856 . Dyslipidemia   . Essential hypertension   . 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 Fuchs' corneal dystrophy 2016   sees Dr KMaudie Mercury . Fuchs' corneal dystrophy   . GERD (gastroesophageal reflux disease)   . Heart attack ([place]    silent  . Heart disease    history of blood clot in left ventricle per pt   . History of radiation exposure    right vocal cord squamous cell cancer  . Impingement syndrome of right shoulder 10/2015   s/p steroid injection Dr MSabra Heck . Ischemic cardiomyopathy    a. dilated, EF 35% improved to 45-50% (2015);  b. 07/2015 EF 25-35% by LV gram.  . [place] atrial fibrillation ([place] 1983   a. isolated episode, not on [place]  .[place] KitchenMural thrombus of cardiac apex    a. 06/2014: LV; resolved with coumadin-->no residual on f/u echo, no longer on coumadin.  .  Osteoarthritis    a. R-shoulder, L-knee (Sabra Heckortho)  . 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 IKellie Moor  . 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 Thrombocytopenia ([place]   . Vitamin D deficiency    Past Surgical History:  Procedure Laterality Date  . BICEPS TENDON REPAIR Right 1993  . CARDIAC CATHETERIZATION N/A 07/05/2015   Procedure: Left Heart Cath and Coronary Angiography;  Surgeon: TMinna Merritts MD;  [facility];  Service: Cardiovascular;  Laterality: N/A;  . CATARACT EXTRACTION Left 12/2016   with keratoplasty  . COLONOSCOPY  2007  . COLONOSCOPY WITH PROPOFOL N/A 12/02/2017   TA, SSA, rpt 3 yrs(Tahiliani, Varnita B, MD)  . CORONARY ARTERY BYPASS GRAFT N/A 07/29/2015   Procedure: CORONARY ARTERY BYPASS GRAFTING (CABG) x 5 (LIMA to LAD, SVG to DIAGONAL, SVG SEQUENTIALLY to OM1 and OM2, SVG to OM3) with Endoscopic Vein Havesting of GREATER SAPHENOUS VEIN from RIGHT THIGH and partial LOWER LEG ;  Surgeon: BGaye Pollack MD;  [facility];  Service: Open Heart Surgery;  Laterality: N/A;  .  EYE SURGERY     b/l cataract and cornea replaced   . HAND SURGERY     left hand 1st/2nd trigger fingers Dr. Sabra Heck ortho   . JOINT REPLACEMENT    . KNEE ARTHROSCOPY Left remote  . right biceps tendon     repair/re attachment   . SKIN CANCER EXCISION  10/2015   BCC - L ala (pending MOHs) and L scapula (complete excision)  . TEE WITHOUT CARDIOVERSION N/A 07/29/2015   Procedure: TRANSESOPHAGEAL ECHOCARDIOGRAM (TEE);  Surgeon: [name], MD;  Location: [facility];  Service: Open Heart Surgery;  Laterality: N/A;  . TONSILLECTOMY  1949  . TOTAL KNEE ARTHROPLASTY Left 03/18/2016   cemented L TKR; Earnestine Leys, MD   Family History  Problem Relation Age of Onset  . CAD Father 47       MI  . Hypertension Father   . Hyperlipidemia Father   . Alcoholism  Father   . Diabetes Father   . Cancer Daughter        dx'ed 61 retroperitoneal liposarcoma    Social History   Socioeconomic History  . Marital status: Significant Other    Spouse name: Not on file  . Number of children: Not on file  . Years of education: Not on file  . Highest education level: Not on file  Occupational History  . Not on file  Tobacco Use  . Smoking status: Former Smoker    Packs/day: 0.50    Years: 10.00    Pack years: 5.00    Types: Cigarettes    Quit date: 11/02/1978    Years since quitting: 41.9  . Smokeless tobacco: Never Used  . Tobacco comment: former smoker [phone] 1 pk/week no FH lung cancer   Substance and Sexual Activity  . Alcohol use: Yes    Alcohol/week: 0.0 standard drinks    Comment: beer/wine on weekends  . Drug use: No  . Sexual activity: Yes  Other Topics Concern  . Not on file  Social History Narrative   Lives with fiancee for 59yr (Metta Clines   [place]    Divorced   Retired [employer] treasurer   Occupation Retired ANurse, mental health  Edu: 1 yr college   Activity: volunteers at [employer], fruits/vegetables daily      Tested at risk for OSA in preop for CABG   Social Determinants of HRadio broadcast assistantStrain:   . Difficulty of Paying Living Expenses: Not on file  Food Insecurity:   . Worried About RCharity fundraiserin the Last Year: Not on file  . Ran Out of Food in the Last Year: Not on file  Transportation Needs:   . Lack of Transportation (Medical): Not on file  . Lack of Transportation (Non-Medical): Not on file  Physical Activity:   . Days of Exercise per Week: Not on file  . Minutes of Exercise per Session: Not on file  Stress:   . Feeling of Stress : Not on file  Social Connections:   . Frequency of Communication with Friends and Family: Not on file  . Frequency of Social Gatherings with Friends and Family: Not on file  . Attends Religious Services: Not on file  .  Active Member of Clubs or Organizations: Not on file  . Attends CArchivistMeetings: Not on file  . Marital Status: Not on file  Intimate Partner Violence:   .  Fear of Current or Ex-Partner: Not on file  . Emotionally Abused: Not on file  . Physically Abused: Not on file  . Sexually Abused: Not on file   Current Meds  Medication Sig  . acetaminophen (TYLENOL) 500 MG tablet Take 500 mg by mouth every 6 (six) hours as needed for mild pain.   Marland Kitchen amoxicillin (AMOXIL) 500 MG capsule amoxicillin 500 mg capsule  . aspirin (ASPIRIN EC) 81 MG EC tablet Take 162 mg by mouth daily. Swallow whole.   Marland Kitchen atorvastatin (LIPITOR) 40 MG tablet TAKE 1 TABLET BY MOUTH EVERY DAY  . Cholecalciferol (VITAMIN D-3) 125 MCG (5000 UT) TABS Take by mouth daily.  Marland Kitchen ezetimibe (ZETIA) 10 MG tablet TAKE 1 TABLET BY MOUTH EVERY DAY  . famotidine (PEPCID) 20 MG tablet Take 1 tablet (20 mg total) by mouth 2 (two) times daily as needed for heartburn or indigestion. Daily to 2x per day prn. D/c zantac it is discontinued  . fluticasone (FLONASE) 50 MCG/ACT nasal spray Place 1 spray into both nostrils as needed for allergies. Reported on 03/18/2016  . lansoprazole (PREVACID) 30 MG capsule Take 1 capsule (30 mg total) by mouth every morning.  Marland Kitchen lisinopril (ZESTRIL) 5 MG tablet Take 1 tablet (5 mg total) by mouth daily.  . metFORMIN (GLUCOPHAGE) 500 MG tablet Take 1 tablet (500 mg total) by mouth 2 (two) times daily with a meal.  . metoprolol succinate (TOPROL-XL) 25 MG 24 hr tablet TAKE 1 TABLET BY MOUTH EVERY DAY  . prednisoLONE acetate (PRED FORTE) 1 % ophthalmic suspension Place 1 drop into both eyes once daily  . sildenafil (REVATIO) 20 MG tablet Take 2-4 tablets (40-80 mg total) by mouth daily as needed (relations).  . vitamin B-12 (CYANOCOBALAMIN) 500 MCG tablet Take 500 mcg by mouth daily. Reported on 03/17/2016  . [DISCONTINUED] lisinopril (ZESTRIL) 5 MG tablet Take 1 tablet (5 mg total) by mouth daily.  .  [DISCONTINUED] metFORMIN (GLUCOPHAGE) 500 MG tablet Take 1 tablet (500 mg total) by mouth 2 (two) times daily with a meal.   Allergies  Allergen Reactions  . Pollen Extract Itching   Recent Results (from the past 2160 hour(s))  B12     Status: None   Collection Time: 10/01/20 10:00 AM  Result Value Ref Range   Vitamin B-12 307 211 - 911 pg/mL  Vitamin D (25 hydroxy)     Status: None   Collection Time: 10/01/20 10:00 AM  Result Value Ref Range   VITD 46.90 30.00 - 100.00 ng/mL  Microalbumin / creatinine urine ratio     Status: None   Collection Time: 10/01/20 10:00 AM  Result Value Ref Range   Creatinine, Urine 91 20 - 320 mg/dL   Microalb, Ur 0.9 mg/dL    Comment: Reference Range Not established    Microalb Creat Ratio 10 <30 mcg/mg creat    Comment: . The ADA defines abnormalities in albumin excretion as follows: Marland Kitchen Albuminuria Category        Result (mcg/mg creatinine) . Normal to Mildly increased   <30 Moderately increased         30-299  Severely increased           > OR = 300 . The ADA recommends that at least two of three specimens collected within a 3-6 month period be abnormal before considering a patient to be within a diagnostic category.   Urinalysis, Routine w reflex microscopic     Status: None   Collection Time: 10/01/20  10:00 AM  Result Value Ref Range   Color, Urine YELLOW YELLOW   APPearance CLEAR CLEAR   Specific Gravity, Urine 1.015 1.001 - 1.03   pH 5.5 5.0 - 8.0   Glucose, UA NEGATIVE NEGATIVE   Bilirubin Urine NEGATIVE NEGATIVE   Ketones, ur NEGATIVE NEGATIVE   Hgb urine dipstick NEGATIVE NEGATIVE   Protein, ur NEGATIVE NEGATIVE   Nitrite NEGATIVE NEGATIVE   Leukocytes,Ua NEGATIVE NEGATIVE  TSH     Status: None   Collection Time: 10/01/20 10:00 AM  Result Value Ref Range   TSH 1.64 0.35 - 4.50 uIU/mL  Hemoglobin A1c     Status: None   Collection Time: 10/01/20 10:00 AM  Result Value Ref Range   Hgb A1c MFr Bld 6.5 4.6 - 6.5 %     Comment: Glycemic Control Guidelines for People with Diabetes:Non Diabetic:  <6%Goal of Therapy: <7%Additional Action Suggested:  >8%   CBC with Differential/Platelet     Status: None   Collection Time: 10/01/20 10:00 AM  Result Value Ref Range   WBC 8.2 4.0 - 10.5 K/uL   RBC 4.55 4.22 - 5.81 Mil/uL   Hemoglobin 14.6 13.0 - 17.0 g/dL   HCT 44.0 39 - 52 %   MCV 96.7 78.0 - 100.0 fl   MCHC 33.3 30.0 - 36.0 g/dL   RDW 13.7 11.5 - 15.5 %   Platelets 153.0 150 - 400 K/uL   Neutrophils Relative % 58.0 43 - 77 %   Lymphocytes Relative 31.9 12 - 46 %   Monocytes Relative 7.8 3 - 12 %   Eosinophils Relative 1.5 0 - 5 %   Basophils Relative 0.8 0 - 3 %   Neutro Abs 4.8 1.4 - 7.7 K/uL   Lymphs Abs 2.6 0.7 - 4.0 K/uL   Monocytes Absolute 0.6 0.1 - 1.0 K/uL   Eosinophils Absolute 0.1 0.0 - 0.7 K/uL   Basophils Absolute 0.1 0.0 - 0.1 K/uL  Lipid panel     Status: None   Collection Time: 10/01/20 10:00 AM  Result Value Ref Range   Cholesterol 109 0 - 200 mg/dL    Comment: ATP III Classification       Desirable:  < 200 mg/dL               Borderline High:  200 - 239 mg/dL          High:  > = 240 mg/dL   Triglycerides 104.0 0 - 149 mg/dL    Comment: Normal:  <150 mg/dLBorderline High:  150 - 199 mg/dL   HDL 47.50 >39.00 mg/dL   VLDL 20.8 0.0 - 40.0 mg/dL   LDL Cholesterol 41 0 - 99 mg/dL   Total CHOL/HDL Ratio 2     Comment:                Men          Women1/2 Average Risk     3.4          3.3Average Risk          5.0          4.42X Average Risk          9.6          7.13X Average Risk          15.0          11.0  NonHDL 61.64     Comment: NOTE:  Non-HDL goal should be 30 mg/dL higher than patient's LDL goal (i.e. LDL goal of < 70 mg/dL, would have non-HDL goal of < 100 mg/dL)  Comprehensive metabolic panel     Status: Abnormal   Collection Time: 10/01/20 10:00 AM  Result Value Ref Range   Sodium 136 135 - 145 mEq/L   Potassium 4.5 3.5 - 5.1 mEq/L   Chloride 100 96 - 112  mEq/L   CO2 30 19 - 32 mEq/L   Glucose, Bld 112 (H) 70 - 99 mg/dL   BUN 20 6 - 23 mg/dL   Creatinine, Ser 0.99 0.40 - 1.50 mg/dL   Total Bilirubin 0.7 0.2 - 1.2 mg/dL   Alkaline Phosphatase 52 39 - 117 U/L   AST 17 0 - 37 U/L   ALT 13 0 - 53 U/L   Total Protein 7.2 6.0 - 8.3 g/dL   Albumin 4.4 3.5 - 5.2 g/dL   GFR 73.67 >60.00 mL/min    Comment: Calculated using the CKD-EPI Creatinine Equation (2021)   Calcium 9.5 8.4 - 10.5 mg/dL   Objective  Body mass index is 29 kg/m. Wt Readings from Last 3 Encounters:  10/04/20 213 lb 12.8 oz (97 kg)  09/30/20 214 lb (97.1 kg)  08/14/20 215 lb (97.5 kg)   Temp Readings from Last 3 Encounters:  10/04/20 97.6 F (36.4 C) (Oral)  04/04/20 (!) 97.3 F (36.3 C)  08/07/19 (!) 97.4 F (36.3 C)   BP Readings from Last 3 Encounters:  10/04/20 128/84  09/30/20 118/72  04/04/20 130/78   Pulse Readings from Last 3 Encounters:  10/04/20 61  09/30/20 67  04/04/20 61    Physical Exam Vitals and nursing note reviewed.  Constitutional:      Appearance: Normal appearance. He is well-developed, well-groomed and overweight.  HENT:     Head: Normocephalic and atraumatic.  Eyes:     Conjunctiva/sclera: Conjunctivae normal.     Pupils: Pupils are equal, round, and reactive to light.  Cardiovascular:     Rate and Rhythm: Normal rate and regular rhythm.     Heart sounds: Normal heart sounds.     Comments: +PVCs Pulmonary:     Effort: Pulmonary effort is normal.     Breath sounds: Normal breath sounds.  Skin:    General: Skin is warm and dry.  Neurological:     General: No focal deficit present.     Mental Status: He is alert and oriented to person, place, and time. Mental status is at baseline.     Gait: Gait normal.  Psychiatric:        Attention and Perception: Attention and perception normal.        Mood and Affect: Mood and affect normal.        Speech: Speech normal.        Behavior: Behavior normal. Behavior is cooperative.         Thought Content: Thought content normal.        Cognition and Memory: Cognition and memory normal.        Judgment: Judgment normal.     Assessment  Plan   Adjustment reaction with anxiety and depression - Plan: clonazePAM (KLONOPIN) 0.5 MG tablet 1/2 dose, Ambulatory referral to Psychology Grief - Plan: Ambulatory referral to Psychology Alene Mires  SCC (squamous cell carcinoma) Mohs sch 11/12/20 in [place] hypertension controlled - Plan: lisinopril (ZESTRIL) 5 MG tablet, metoprolol 25 mg qd  xl   HTN with Type 2 diabetes mellitus without complication, without long-term current use of insulin ([place]) - Plan: metFORMIN (GLUCOPHAGE) 500 MG tablet bid   Overweight (BMI 25.0-29.9)  rec healthy diet and exercise   HM Had flu utd, prevnar, pna 23, Tdap, Zoster, Had2/2 shingrix7/3/20 and 2nd dose 07/2019 MMR immune, hep B immune  -Consider hep A vaccine in future h/o fatty liver  covid 19 vx 2/2 moderna pfizer 1/1 3 doses  total Colonoscopy [facility] 12/02/17 rec repeat in 3 years sessile serrated and tubular sch 11/27/20 EGD/colonoscopy  F/u dermatology Dr. Kellie Moor h/o [facility] [place] 11 or 12/2019did topical chemo in winter 2020 rec 04/04/20  Pt call and schedule appt scc left cheek sch mohs 11/12/20 in [place]   Former smoker 1 pk/week 1967-1980 no FH lung cancer, no chew  If plts continue to be low -will rec hematology in future  [facility] seen 11/07/2018 Dr. Junious Silk BPH with LUTS stable PSA in 6 months PSA was 3.071/2/20 then 3.32 06/29/19  -saw urology 07/11/2019 and will see July 2021 then yearly Dr. Junious Silk  As of 10/07/20 per Dr. Junious Silk He was 3.05 Jun 2019. I said in my Sep 2021 note that PSA was sent this year, but does not look like it was done. I will send him a message to come by my office for a PSA - thanks.   ROI PSA 10/04/20   Vitamin D3 rec 2000 IU qd  ENT-h/o right vocal cord [facility] Saw ENT Dr. Richardson Landry 08/2020 LPR no recurrence scc vocal cord  cancer 2008 mild RT changes some mild reflux related to irritation rec PPI pm meals Saw 08/2020 need to get notes   [facility] appt 01/28/21 Dr. Thomasene Ripple  Provider: Dr. Olivia Mackie [name]-Internal Medicine

## 2020-10-04 NOTE — Telephone Encounter (Signed)
Patient has been rescheduled for procedure. New instructions will be sent out.

## 2020-10-04 NOTE — Telephone Encounter (Signed)
Patient states he would like to move his procedure from 1.12.21 to 1.19.21 if possible. Pt had screening call 12.1.21. Please call pt back to reschedule.

## 2020-10-07 DIAGNOSIS — F4323 Adjustment disorder with mixed anxiety and depressed mood: Secondary | ICD-10-CM | POA: Insufficient documentation

## 2020-10-07 DIAGNOSIS — F4321 Adjustment disorder with depressed mood: Secondary | ICD-10-CM | POA: Insufficient documentation

## 2020-10-07 DIAGNOSIS — C4492 Squamous cell carcinoma of skin, unspecified: Secondary | ICD-10-CM | POA: Insufficient documentation

## 2020-10-07 DIAGNOSIS — E663 Overweight: Secondary | ICD-10-CM | POA: Insufficient documentation

## 2020-10-07 DIAGNOSIS — I152 Hypertension secondary to endocrine disorders: Secondary | ICD-10-CM | POA: Insufficient documentation

## 2020-10-07 HISTORY — DX: Adjustment disorder with mixed anxiety and depressed mood: F43.23

## 2020-10-07 HISTORY — DX: Adjustment disorder with depressed mood: F43.21

## 2020-10-09 ENCOUNTER — Ambulatory Visit: Payer: Medicare Other | Admitting: Internal Medicine

## 2020-10-17 ENCOUNTER — Ambulatory Visit (INDEPENDENT_AMBULATORY_CARE_PROVIDER_SITE_OTHER): Payer: Medicare Other | Admitting: Psychologist

## 2020-10-17 DIAGNOSIS — F320 Major depressive disorder, single episode, mild: Secondary | ICD-10-CM

## 2020-10-17 DIAGNOSIS — Z634 Disappearance and death of family member: Secondary | ICD-10-CM

## 2020-11-04 ENCOUNTER — Telehealth: Payer: Self-pay | Admitting: Gastroenterology

## 2020-11-04 DIAGNOSIS — Z86010 Personal history of colonic polyps: Secondary | ICD-10-CM

## 2020-11-04 NOTE — Telephone Encounter (Signed)
Patient wants to reschedule his procedure with Dr. Servando Snare instead of Dr. Maximino Greenland due to his wife seeig Dr. Servando Snare and him wanting a male provider. Pt has seen Dr. Maximino Greenland in office in 2019. Is this okay, if so please call pt to reschedule. Pt had screening call on 12.1.21.

## 2020-11-04 NOTE — Telephone Encounter (Signed)
Colonoscopy has been rescheduled from 11/27/20 with Dr. Maximino Greenland, to 11/26/20 with Dr. Servando Snare at [facility]  Trish in Endo has been notified. Referral updated. New instructions sent via mychart and mailed.  Thanks,  [place], [place]

## 2020-11-15 ENCOUNTER — Ambulatory Visit (INDEPENDENT_AMBULATORY_CARE_PROVIDER_SITE_OTHER): Payer: Medicare Other | Admitting: Psychologist

## 2020-11-15 DIAGNOSIS — Z634 Disappearance and death of family member: Secondary | ICD-10-CM

## 2020-11-15 DIAGNOSIS — F320 Major depressive disorder, single episode, mild: Secondary | ICD-10-CM | POA: Diagnosis not present

## 2020-11-19 ENCOUNTER — Other Ambulatory Visit: Payer: Self-pay | Admitting: Cardiovascular Disease

## 2020-11-21 ENCOUNTER — Other Ambulatory Visit
Admission: RE | Admit: 2020-11-21 | Discharge: 2020-11-21 | Disposition: A | Discharge status: Home / Self Care | Payer: Medicare Other | Source: Ambulatory Visit | Attending: Gastroenterology | Admitting: Gastroenterology

## 2020-11-21 ENCOUNTER — Other Ambulatory Visit: Payer: Self-pay

## 2020-11-21 DIAGNOSIS — Z01812 Encounter for preprocedural laboratory examination: Secondary | ICD-10-CM | POA: Diagnosis present

## 2020-11-21 DIAGNOSIS — Z20822 Contact with and (suspected) exposure to covid-19: Secondary | ICD-10-CM | POA: Diagnosis not present

## 2020-11-21 LAB — SARS CORONAVIRUS 2 (TAT 6-24 HRS): SARS Coronavirus 2: NEGATIVE

## 2020-11-22 ENCOUNTER — Other Ambulatory Visit: Admission: RE | Admit: 2020-11-22 | Payer: Medicare Other | Source: Ambulatory Visit

## 2020-11-26 ENCOUNTER — Other Ambulatory Visit: Payer: Self-pay

## 2020-11-26 ENCOUNTER — Encounter
Admission: RE | Disposition: A | Discharge status: Home / Self Care | Payer: Self-pay | Source: Home / Self Care | Attending: Gastroenterology

## 2020-11-26 ENCOUNTER — Ambulatory Visit
Admission: RE | Admit: 2020-11-26 | Discharge: 2020-11-26 | Disposition: A | Discharge status: Home / Self Care | Payer: Medicare Other | Attending: Gastroenterology | Admitting: Gastroenterology

## 2020-11-26 ENCOUNTER — Encounter: Payer: Self-pay | Admitting: Gastroenterology

## 2020-11-26 ENCOUNTER — Ambulatory Visit: Payer: Medicare Other | Admitting: Registered Nurse

## 2020-11-26 DIAGNOSIS — I252 Old myocardial infarction: Secondary | ICD-10-CM | POA: Diagnosis not present

## 2020-11-26 DIAGNOSIS — Z8521 Personal history of malignant neoplasm of larynx: Secondary | ICD-10-CM | POA: Diagnosis not present

## 2020-11-26 DIAGNOSIS — D120 Benign neoplasm of cecum: Secondary | ICD-10-CM | POA: Diagnosis not present

## 2020-11-26 DIAGNOSIS — Z951 Presence of aortocoronary bypass graft: Secondary | ICD-10-CM | POA: Insufficient documentation

## 2020-11-26 DIAGNOSIS — Z808 Family history of malignant neoplasm of other organs or systems: Secondary | ICD-10-CM | POA: Insufficient documentation

## 2020-11-26 DIAGNOSIS — Z7982 Long term (current) use of aspirin: Secondary | ICD-10-CM | POA: Insufficient documentation

## 2020-11-26 DIAGNOSIS — K640 First degree hemorrhoids: Secondary | ICD-10-CM | POA: Insufficient documentation

## 2020-11-26 DIAGNOSIS — Z8349 Family history of other endocrine, nutritional and metabolic diseases: Secondary | ICD-10-CM | POA: Insufficient documentation

## 2020-11-26 DIAGNOSIS — E118 Type 2 diabetes mellitus with unspecified complications: Secondary | ICD-10-CM | POA: Diagnosis not present

## 2020-11-26 DIAGNOSIS — Z9109 Other allergy status, other than to drugs and biological substances: Secondary | ICD-10-CM | POA: Diagnosis not present

## 2020-11-26 DIAGNOSIS — K5730 Diverticulosis of large intestine without perforation or abscess without bleeding: Secondary | ICD-10-CM | POA: Insufficient documentation

## 2020-11-26 DIAGNOSIS — Z1211 Encounter for screening for malignant neoplasm of colon: Secondary | ICD-10-CM | POA: Insufficient documentation

## 2020-11-26 DIAGNOSIS — Z87891 Personal history of nicotine dependence: Secondary | ICD-10-CM | POA: Diagnosis not present

## 2020-11-26 DIAGNOSIS — Z86718 Personal history of other venous thrombosis and embolism: Secondary | ICD-10-CM | POA: Insufficient documentation

## 2020-11-26 DIAGNOSIS — K635 Polyp of colon: Secondary | ICD-10-CM | POA: Diagnosis not present

## 2020-11-26 DIAGNOSIS — I2510 Atherosclerotic heart disease of native coronary artery without angina pectoris: Secondary | ICD-10-CM | POA: Diagnosis not present

## 2020-11-26 DIAGNOSIS — Z7984 Long term (current) use of oral hypoglycemic drugs: Secondary | ICD-10-CM | POA: Diagnosis not present

## 2020-11-26 DIAGNOSIS — Z833 Family history of diabetes mellitus: Secondary | ICD-10-CM | POA: Insufficient documentation

## 2020-11-26 DIAGNOSIS — Z923 Personal history of irradiation: Secondary | ICD-10-CM | POA: Diagnosis not present

## 2020-11-26 DIAGNOSIS — Z860100 Personal history of colon polyps, unspecified: Secondary | ICD-10-CM

## 2020-11-26 DIAGNOSIS — D696 Thrombocytopenia, unspecified: Secondary | ICD-10-CM | POA: Insufficient documentation

## 2020-11-26 DIAGNOSIS — Z8249 Family history of ischemic heart disease and other diseases of the circulatory system: Secondary | ICD-10-CM | POA: Insufficient documentation

## 2020-11-26 HISTORY — PX: COLONOSCOPY WITH PROPOFOL: SHX5780

## 2020-11-26 LAB — GLUCOSE, CAPILLARY: Glucose-Capillary: 129 mg/dL — ABNORMAL HIGH (ref 70–99)

## 2020-11-26 SURGERY — COLONOSCOPY WITH PROPOFOL
Anesthesia: General

## 2020-11-26 MED ORDER — KETAMINE HCL 50 MG/5ML IJ SOSY
PREFILLED_SYRINGE | INTRAMUSCULAR | Status: AC
  Filled 2020-11-26: qty 5

## 2020-11-26 MED ORDER — SODIUM CHLORIDE 0.9 % IV SOLN: INTRAVENOUS | Status: DC

## 2020-11-26 MED ORDER — LIDOCAINE HCL (CARDIAC) PF 100 MG/5ML IV SOSY
PREFILLED_SYRINGE | INTRAVENOUS | Status: DC | PRN
  Administered 2020-11-26: 100 mg via INTRAVENOUS

## 2020-11-26 MED ORDER — PROPOFOL 500 MG/50ML IV EMUL
INTRAVENOUS | Status: DC | PRN
  Administered 2020-11-26: 120 ug/kg/min via INTRAVENOUS

## 2020-11-26 MED ORDER — PROPOFOL 10 MG/ML IV BOLUS
INTRAVENOUS | Status: DC | PRN
Start: 2020-11-26 — End: 2020-11-26
  Administered 2020-11-26: 20 mg via INTRAVENOUS
  Administered 2020-11-26: 50 mg via INTRAVENOUS

## 2020-11-26 MED ORDER — PHENYLEPHRINE HCL (PRESSORS) 10 MG/ML IV SOLN
INTRAVENOUS | Status: DC | PRN
  Administered 2020-11-26 (×3): 100 ug via INTRAVENOUS

## 2020-11-26 NOTE — H&P (Signed)
Shannon Lame, MD [address] [place], [place] [address] Phone:[phone] Fax : [phone]  Primary Care Physician:  [name], [name], MD Primary Gastroenterologist:  Dr. Allen Norris  Pre-Procedure History & Physical: HPI:  Shannon Chung is a 80 y.o. male is here for an colonoscopy.   Past Medical History:  Diagnosis Date  . Coronary artery disease    a. 06/2015 Cardiac CT: Ca score 1103 (84th %'ile);  b. 07/2015 Cath: LM 70, LAD 80p, 100/72m, D1 70, D2 95, RI 75, RCA 100p/m;  c. 07/2015 CABG x 5 (LIMA->LAD, VG->Diag, VG->OM1->OM2, VG->OM3).  . Diabetes mellitus without complication ([place]) [id number]  . Dyslipidemia   . Essential hypertension   . 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 Fuchs' corneal dystrophy 2016   sees Dr Maudie Mercury  . Fuchs' corneal dystrophy   . GERD (gastroesophageal reflux disease)   . Heart attack ([place])    silent  . Heart disease    history of blood clot in left ventricle per pt   . History of radiation exposure    right vocal cord squamous cell cancer  . Impingement syndrome of right shoulder 10/2015   s/p steroid injection Dr Sabra Heck  . Ischemic cardiomyopathy    a. dilated, EF 35% improved to 45-50% (2015);  b. 07/2015 EF 25-35% by LV gram.  . 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 isolated episode, not on [place].  Marland Kitchen Mural thrombus of cardiac apex    a. 06/2014: LV; resolved with coumadin-->no residual on f/u echo, no longer on coumadin.  . Osteoarthritis    a. R-shoulder, L-knee [facility]ra Heck ortho)  . 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 Kellie Moor   . 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 Thrombocytopenia ([place])   . Vitamin D deficiency     Past Surgical History:  Procedure Laterality Date  . BICEPS TENDON REPAIR Right 1993  . CARDIAC CATHETERIZATION N/A 07/05/2015   Procedure: Left  Heart Cath and Coronary Angiography;  Surgeon: Minna Merritts, MD;  Location: [facility];  Service: Cardiovascular;  Laterality: N/A;  . CATARACT EXTRACTION Left 12/2016   with keratoplasty  . COLONOSCOPY  2007  . COLONOSCOPY WITH PROPOFOL N/A 12/02/2017   TA, SSA, rpt 3 yrs(Tahiliani, Varnita B, MD)  . CORONARY ARTERY BYPASS GRAFT N/A 07/29/2015   Procedure: CORONARY ARTERY BYPASS GRAFTING (CABG) x 5 (LIMA to LAD, SVG to DIAGONAL, SVG SEQUENTIALLY to OM1 and OM2, SVG to OM3) with Endoscopic Vein Havesting of GREATER SAPHENOUS VEIN from RIGHT THIGH and partial LOWER LEG ;  Surgeon: [name], MD;  Location: [facility];  Service: Open Heart Surgery;  Laterality: N/A;  . EYE SURGERY     b/l cataract and cornea replaced   . HAND SURGERY     left hand 1st/2nd trigger fingers Dr. Sabra Heck ortho   . JOINT REPLACEMENT    . KNEE ARTHROSCOPY Left remote  . right biceps tendon     repair/re attachment   . SKIN CANCER EXCISION  10/2015   BCC - L ala (pending MOHs) and L scapula (complete excision)  . TEE WITHOUT CARDIOVERSION N/A 07/29/2015   Procedure: TRANSESOPHAGEAL ECHOCARDIOGRAM (TEE);  Surgeon: [name], MD;  Location: [facility];  Service: Open Heart Surgery;  Laterality: N/A;  . TONSILLECTOMY  1949  . TOTAL KNEE ARTHROPLASTY Left 03/18/2016   cemented L TKR; Earnestine Leys, MD    Prior to Admission medications   Medication Sig Start Date End Date Taking? Authorizing Provider  metoprolol succinate (TOPROL-XL) 25 MG 24 hr tablet TAKE 1 TABLET BY MOUTH EVERY DAY 11/19/20  Yes Gollan, Kathlene November, MD  acetaminophen (TYLENOL) 500 MG tablet Take 500 mg by mouth every 6 (six) hours as needed for mild pain.     [provider]  amoxicillin (AMOXIL) 500 MG capsule amoxicillin 500 mg capsule    [provider]  aspirin 81 MG EC tablet Take 162 mg by mouth daily. Swallow whole.    [provider]  atorvastatin (LIPITOR) 40 MG tablet TAKE 1 TABLET BY MOUTH EVERY DAY  09/05/20   Minna Merritts, MD  Cholecalciferol (VITAMIN D-3) 125 MCG (5000 UT) TABS Take by mouth daily.    [provider]  clonazePAM (KLONOPIN) 0.5 MG tablet Take 0.5 tablets (0.25 mg total) by mouth daily as needed for anxiety. 10/04/20   [name], [name], MD  ezetimibe (ZETIA) 10 MG tablet TAKE 1 TABLET BY MOUTH EVERY DAY 10/01/20   Minna Merritts, MD  famotidine (PEPCID) 20 MG tablet Take 1 tablet (20 mg total) by mouth 2 (two) times daily as needed for heartburn or indigestion. Daily to 2x per day prn. D/c zantac it is discontinued 04/04/20   [name], [name], MD  fluticasone (FLONASE) 50 MCG/ACT nasal spray Place 1 spray into both nostrils as needed for allergies. Reported on 03/18/2016    [provider]  lansoprazole (PREVACID) 30 MG capsule Take 1 capsule (30 mg total) by mouth every morning. 08/12/15   Ria Bush, MD  lisinopril (ZESTRIL) 5 MG tablet Take 1 tablet (5 mg total) by mouth daily. 10/04/20   [name], [name], MD  metFORMIN (GLUCOPHAGE) 500 MG tablet Take 1 tablet (500 mg total) by mouth 2 (two) times daily with a meal. 10/04/20   [name], [name], MD  prednisoLONE acetate (PRED FORTE) 1 % ophthalmic suspension Place 1 drop into both eyes once daily    [provider]  sildenafil (REVATIO) 20 MG tablet Take 2-4 tablets (40-80 mg total) by mouth daily as needed (relations). 08/05/20   [name], [name], MD  vitamin B-12 (CYANOCOBALAMIN) 500 MCG tablet Take 500 mcg by mouth daily. Reported on 03/17/2016    [provider]    Allergies as of 10/02/2020 - Review Complete 10/02/2020  Allergen Reaction Noted  . Pollen extract Itching 11/02/1973    Family History  Problem Relation Age of Onset  . CAD Father 68       MI  . Hypertension Father   . Hyperlipidemia Father   . Alcoholism Father   . Diabetes Father   . Cancer Daughter        dx'ed 58 retroperitoneal liposarcoma     Social History    Socioeconomic History  . Marital status: Significant Other    Spouse name: Not on file  . Number of children: Not on file  . Years of education: Not on file  . Highest education level: Not on file  Occupational History  . Not on file  Tobacco Use  . Smoking status: Former Smoker    Packs/day: 0.50    Years: 10.00    Pack years: 5.00    Types: Cigarettes    Quit date: 11/02/1978    Years since quitting: 42.0  . Smokeless tobacco: Never  Used  . Tobacco comment: former smoker [phone] 1 pk/week no FH lung cancer   Substance and Sexual Activity  . Alcohol use: Yes    Alcohol/week: 0.0 standard drinks    Comment: beer/wine on weekends  . Drug use: No  . Sexual activity: Yes  Other Topics Concern  . Not on file  Social History Narrative   Lives with fiancee for 90yrs (Metta Clines)   Moved from [place] years ago in 2015 to this area    Divorced   Retired Corporate treasurer    Occupation Retired Nurse, mental health   Edu: 1 yr college   Activity: volunteers at [employer]: good water, fruits/vegetables daily      Tested at risk for OSA in preop for CABG   Social Determinants of Radio broadcast assistant Strain: Not on [employer] Insecurity: Not on file  Transportation Needs: Not on file  Physical Activity: Not on file  Stress: Not on file  Social Connections: Not on file  Intimate Partner Violence: Not on file    Review of Systems: See HPI, otherwise negative ROS  Physical Exam: BP (!) 161/94   Pulse 81   Temp (!) 96.2 F (35.7 C) (Temporal)   Resp 20   Ht 6' (1.829 m)   Wt 95.7 kg   SpO2 98%   BMI 28.62 kg/m  General:   Alert,  pleasant and cooperative in NAD Head:  Normocephalic and atraumatic. Neck:  Supple; no masses or thyromegaly. Lungs:  Clear throughout to auscultation.    Heart:  Regular rate and rhythm. Abdomen:  Soft, nontender and nondistended. Normal bowel sounds, without guarding, and without rebound.   Neurologic:  Alert and  oriented x4;  grossly normal  neurologically.  Impression/Plan: Earland Reish is here for an colonoscopy to be performed for a history of adenomatous polyps on 11/2017   Risks, benefits, limitations, and alternatives regarding  colonoscopy have been reviewed with the patient.  Questions have been answered.  All parties agreeable.   Shannon Lame, MD  11/26/2020, 8:16 AM

## 2020-11-26 NOTE — Transfer of Care (Signed)
Immediate Anesthesia Transfer of Care Note  Patient: Shannon Chung  Procedure(s) Performed: COLONOSCOPY WITH PROPOFOL (N/A )  Patient Location: PACU and Endoscopy Unit  Anesthesia Type:General  Level of Consciousness: drowsy  Airway & Oxygen Therapy: Patient Spontanous Breathing  Post-op Assessment: Report given to RN and Post -op Vital signs reviewed and stable  Post vital signs: Reviewed and stable, BP treated  Last Vitals:  Vitals Value Taken Time  BP 86/47 11/26/20 0907  Temp    Pulse 59 11/26/20 0908  Resp 17 11/26/20 0908  SpO2 97 % 11/26/20 0908  Vitals shown include unvalidated device data.  Last Pain:  Vitals:   11/26/20 0754  TempSrc: Temporal  PainSc: 0-No pain         Complications: No complications documented.

## 2020-11-26 NOTE — Op Note (Signed)
[facility] Patient Name: Shannon Chung Procedure Date: 11/26/2020 8:47 AM MRN: [id number] Account #: [id number] Date of Birth: 29-Jul-1941 Admit Type: Outpatient Age: 80 Room: [facility] ENDO ROOM 4 Gender: Male Note Status: Finalized Procedure:             Colonoscopy Indications:           High risk colon cancer surveillance: Personal history                         of colonic polyps Providers:             [name] MD, MD Referring MD:          [name] [name] MD, MD (Referring MD) Medicines:             Propofol per Anesthesia Complications:         No immediate complications. Procedure:             Pre-Anesthesia Assessment:                        - Prior to the procedure, a History and Physical was                         performed, and patient medications and allergies were                         reviewed. The patient's tolerance of previous                         anesthesia was also reviewed. The risks and benefits                         of the procedure and the sedation options and risks                         were discussed with the patient. All questions were                         answered, and informed consent was obtained. Prior                         Anticoagulants: The patient has taken no previous                         anticoagulant or antiplatelet agents. ASA Grade                         Assessment: II - A patient with mild systemic disease.                         After reviewing the risks and benefits, the patient                         was deemed in satisfactory condition to undergo the                         procedure.  After obtaining informed consent, the colonoscope was                         passed under direct vision. Throughout the procedure,                         the patient's blood pressure, pulse, and oxygen                         saturations were monitored continuously.  The                         Colonoscope was introduced through the anus and                         advanced to the the cecum, identified by appendiceal                         orifice and ileocecal valve. The colonoscopy was                         performed without difficulty. The patient tolerated                         the procedure well. The quality of the bowel                         preparation was excellent. Findings:      The perianal and digital rectal examinations were normal.      A 6 mm polyp was found in the cecum. The polyp was sessile. The polyp       was removed with a cold snare. Resection and retrieval were complete.      Multiple small-mouthed diverticula were found in the entire colon.      Non-bleeding internal hemorrhoids were found during retroflexion. The       hemorrhoids were Grade I (internal hemorrhoids that do not prolapse). Impression:            - One 6 mm polyp in the cecum, removed with a cold                         snare. Resected and retrieved.                        - Diverticulosis in the entire examined colon.                        - Non-bleeding internal hemorrhoids. Recommendation:        - Discharge patient to home.                        - Resume previous diet.                        - Continue present medications.                        - Await pathology results.                        -  No repeat colonoscopy due to age. Procedure Code(s):     --- Professional ---                        [id number], Colonoscopy, flexible; with removal of                         tumor(s), polyp(s), or other lesion(s) by snare                         technique Diagnosis Code(s):     --- Professional ---                        Z86.010, Personal history of colonic polyps                        K63.5, Polyp of colon CPT copyright 2019 American Medical Association. All rights reserved. The codes documented in this report are preliminary and upon coder review may  be  revised to meet current compliance requirements. [name] MD, MD 11/26/2020 9:03:28 AM This report has been signed electronically. Number of Addenda: 0 Note Initiated On: 11/26/2020 8:47 AM Scope Withdrawal Time: 0 hours 6 minutes 50 seconds  Total Procedure Duration: 0 hours 9 minutes 20 seconds  Estimated Blood Loss:  Estimated blood loss: none.      [facility]

## 2020-11-26 NOTE — Anesthesia Preprocedure Evaluation (Addendum)
Anesthesia Evaluation  Patient identified by MRN, date of birth, ID band Patient awake    Reviewed: Allergy & Precautions, H&P , NPO status , reviewed documented beta blocker date and time   Airway Mallampati: II  TM Distance: >3 FB Neck ROM: limited    Dental  (+) Teeth Intact, Chipped   Pulmonary former smoker,    Pulmonary exam normal        Cardiovascular Exercise Tolerance: Good hypertension, + angina + CAD and + Past MI  Normal cardiovascular exam  2017 ECHO Study Conclusions  - Left ventricle: The cavity size was normal. There was mild concentric hypertrophy. Systolic function was normal. The estimated ejection fraction was in the range of 55% to 60%. Wall motion was normal; there were no regional wall motion abnormalities. Doppler parameters are consistent with abnormal left ventricular relaxation (grade 1 diastolic dysfunction). - Left atrium: The atrium was mildly dilated.   Neuro/Psych PSYCHIATRIC DISORDERS Anxiety  Neuromuscular disease    GI/Hepatic GERD  Controlled,  Endo/Other  diabetes  Renal/GU      Musculoskeletal  (+) Arthritis ,   Abdominal   Peds  Hematology   Anesthesia Other Findings Past Medical History: No date: Coronary artery disease     Comment:  a. 06/2015 Cardiac CT: Ca score 1103 (84th %'ile);  b.               07/2015 Cath: LM 70, LAD 80p, 100/40m, D1 70, D2 95, RI               75, RCA 100p/m;  c. 07/2015 CABG x 5 (LIMA->LAD, VG->Diag,              VG->OM1->OM2, VG->OM3). 07/2015: Diabetes mellitus without complication ([facility]) No date: Dyslipidemia No date: Essential hypertension 10/2015: Facial basal cell cancer     Comment:  L ala, pending MOHs ([place]) 2016: Fuchs' corneal dystrophy     Comment:  sees Dr Maudie Mercury No date: Fuchs' corneal dystrophy No date: GERD (gastroesophageal reflux disease) No date: Heart attack [facility])     Comment:  silent No date: Heart  disease     Comment:  history of blood clot in left ventricle per pt  No date: History of radiation exposure     Comment:  right vocal cord squamous cell cancer 10/2015: Impingement syndrome of right shoulder     Comment:  s/p steroid injection Dr Sabra Heck No date: Ischemic cardiomyopathy     Comment:  a. dilated, EF 35% improved to 45-50% (2015);  b. 07/2015              EF 25-35% by LV gram. 1983: Lone atrial fibrillation ([place])     Comment:  a. isolated episode, not on [place]. No date: Mural thrombus of cardiac apex     Comment:  a. 06/2014: LV; resolved with coumadin-->no residual on               f/u echo, no longer on coumadin. No date: Osteoarthritis     Comment:  a. 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 Kellie Moor  2008: Squamous cell carcinoma of vocal cord [facility])     Comment:  XRT; right vocal cord; had f/u until 2013 or 2015  [facility] [place] [facility] No date: Thrombocytopenia ([place]) No date: Vitamin D deficiency  Past Surgical History: 1993: BICEPS TENDON REPAIR; Right 07/05/2015: CARDIAC CATHETERIZATION; N/A     Comment:  Procedure: Left Heart Cath and Coronary Angiography;                Surgeon: Minna Merritts, MD;  Location: [facility]               [facility];  Service: Cardiovascular;  Laterality: N/A; 12/2016: CATARACT EXTRACTION; Left     Comment:  with keratoplasty 2007: COLONOSCOPY 12/02/2017: COLONOSCOPY WITH PROPOFOL; N/A     Comment:  TA, SSA, rpt 3 yrs(Tahiliani, Varnita B, MD) 07/29/2015: CORONARY ARTERY BYPASS GRAFT; N/A     Comment:  Procedure: CORONARY ARTERY BYPASS GRAFTING (CABG) x 5               (LIMA to LAD, SVG to DIAGONAL, SVG SEQUENTIALLY to OM1               and OM2, SVG to OM3) with Endoscopic Vein Havesting of               GREATER SAPHENOUS VEIN from RIGHT THIGH and partial LOWER              LEG ;  Surgeon: [name], MD;   Location: [facility] OR;                Service: Open Heart Surgery;  Laterality: N/A; No date: EYE SURGERY     Comment:  b/l cataract and cornea replaced  No date: HAND SURGERY     Comment:  left hand 1st/2nd trigger fingers Dr. Sabra Heck ortho  No date: JOINT REPLACEMENT remote: KNEE ARTHROSCOPY; Left No date: right biceps tendon     Comment:  repair/re attachment  10/2015: SKIN CANCER EXCISION     Comment:  BCC - L ala (pending MOHs) and L scapula (complete               excision) 07/29/2015: TEE WITHOUT CARDIOVERSION; N/A     Comment:  Procedure: TRANSESOPHAGEAL ECHOCARDIOGRAM (TEE);                Surgeon: [name], MD;  Location: [facility];  Service:               Open Heart Surgery;  Laterality: N/A; 1949: TONSILLECTOMY 03/18/2016: TOTAL KNEE ARTHROPLASTY; Left     Comment:  cemented L TKR; Earnestine Leys, MD  BMI    Body Mass Index: 28.62 kg/m      Reproductive/Obstetrics                            Anesthesia Physical Anesthesia Plan  ASA: III  Anesthesia Plan: General   Post-op Pain Management:    Induction: Intravenous  PONV Risk Score and Plan: Treatment may vary due to age or medical condition and TIVA  Airway Management Planned: Nasal Cannula and Natural Airway  Additional Equipment:   Intra-op Plan:   Post-operative Plan:   Informed Consent: I have reviewed the patients History and Physical, chart, labs and discussed the procedure including the risks, benefits and alternatives for the proposed anesthesia with the patient or authorized representative who has indicated his/her understanding and acceptance.     Dental Advisory Given  Plan Discussed with: CRNA  Anesthesia Plan Comments:         Anesthesia Quick Evaluation

## 2020-11-26 NOTE — Anesthesia Postprocedure Evaluation (Signed)
Anesthesia Post Note  Patient: [name]  Procedure(s) Performed: COLONOSCOPY WITH PROPOFOL (N/A )  Patient location during evaluation: Endoscopy Anesthesia Type: General Level of consciousness: awake and alert Pain management: pain level controlled Vital Signs Assessment: post-procedure vital signs reviewed and stable Respiratory status: spontaneous breathing, nonlabored ventilation and respiratory function stable Cardiovascular status: blood pressure returned to baseline and stable Postop Assessment: no apparent nausea or vomiting Anesthetic complications: no   No complications documented.   Last Vitals:  Vitals:   11/26/20 0926 11/26/20 0936  BP: 123/86 (!) 147/90  Pulse: 66   Resp: 16 20  Temp:  (!) 35.6 C  SpO2: 99% 99%    Last Pain:  Vitals:   11/26/20 0936  TempSrc: Temporal  PainSc: 0-No pain                 Alphonsus Sias

## 2020-11-27 ENCOUNTER — Encounter: Payer: Self-pay | Admitting: Gastroenterology

## 2020-11-27 LAB — SURGICAL PATHOLOGY

## 2020-11-28 ENCOUNTER — Encounter: Payer: Self-pay | Admitting: Gastroenterology

## 2020-12-15 ENCOUNTER — Encounter: Payer: Self-pay | Admitting: Internal Medicine

## 2020-12-16 ENCOUNTER — Other Ambulatory Visit: Payer: Self-pay | Admitting: Internal Medicine

## 2020-12-16 DIAGNOSIS — N529 Male erectile dysfunction, unspecified: Secondary | ICD-10-CM

## 2020-12-16 MED ORDER — SILDENAFIL CITRATE 20 MG PO TABS: 40.0000 mg | ORAL_TABLET | Freq: Every day | ORAL | 11 refills | Status: DC | PRN

## 2021-01-28 LAB — HM DIABETES EYE EXAM

## 2021-01-29 ENCOUNTER — Encounter: Payer: Self-pay | Admitting: Internal Medicine

## 2021-02-11 ENCOUNTER — Telehealth: Payer: Self-pay | Admitting: Cardiovascular Disease

## 2021-02-11 DIAGNOSIS — I10 Essential (primary) hypertension: Secondary | ICD-10-CM

## 2021-02-11 MED ORDER — LISINOPRIL 5 MG PO TABS: 5.0000 mg | ORAL_TABLET | Freq: Every day | ORAL | 1 refills | Status: DC

## 2021-02-11 NOTE — Telephone Encounter (Signed)
lisinopril (ZESTRIL) 5 MG tablet [[id number]]   Order Details Dose: 5 mg Route: Oral Frequency: Daily  Dispense Quantity: 90 tablet Refills: 1   Note to Pharmacy: Dc 2.5      Sig: Take 1 tablet (5 mg total) by mouth daily.      Start Date: 02/11/21 End Date: --  Written Date: 02/11/21 Expiration Date: 02/11/22    Diagnosis Association: Essential hypertension (I10)  Original Order:  lisinopril (ZESTRIL) 5 MG tablet [[id number]]   Providers  Ordering and Authorizing Provider:   Minna Merritts, MD  [place] [address], [place] [address]  Phone:  [phone]  Fax:  [phone]  DEA #:  [id number]  NPI:  [id number]     Ordering User:  Alesia Oshields, [place], [address]  [place], [place], [place] [address]  Phone:  [phone] Fax:  [phone]  DEA #:  --  DAW Reason: --

## 2021-02-18 ENCOUNTER — Other Ambulatory Visit: Payer: Self-pay | Admitting: *Deleted

## 2021-02-18 MED ORDER — ATORVASTATIN CALCIUM 40 MG PO TABS: 1.0000 | ORAL_TABLET | Freq: Every day | ORAL | 2 refills | Status: DC

## 2021-04-01 ENCOUNTER — Other Ambulatory Visit (INDEPENDENT_AMBULATORY_CARE_PROVIDER_SITE_OTHER): Payer: Medicare Other

## 2021-04-01 ENCOUNTER — Other Ambulatory Visit: Payer: Self-pay

## 2021-04-01 DIAGNOSIS — I152 Hypertension secondary to endocrine disorders: Secondary | ICD-10-CM

## 2021-04-01 DIAGNOSIS — E1159 Type 2 diabetes mellitus with other circulatory complications: Secondary | ICD-10-CM

## 2021-04-01 DIAGNOSIS — E119 Type 2 diabetes mellitus without complications: Secondary | ICD-10-CM

## 2021-04-01 LAB — COMPREHENSIVE METABOLIC PANEL
ALT: 12 U/L (ref 0–53)
AST: 17 U/L (ref 0–37)
Albumin: 4.4 g/dL (ref 3.5–5.2)
Alkaline Phosphatase: 56 U/L (ref 39–117)
BUN: 16 mg/dL (ref 6–23)
CO2: 25 mEq/L (ref 19–32)
Calcium: 9.6 mg/dL (ref 8.4–10.5)
Chloride: 100 mEq/L (ref 96–112)
Creatinine, Ser: 0.89 mg/dL (ref 0.40–1.50)
GFR: 82.58 mL/min (ref >60.00)
Glucose, Bld: 133 mg/dL — ABNORMAL HIGH (ref 70–99)
Potassium: 4 mEq/L (ref 3.5–5.1)
Sodium: 136 mEq/L (ref 135–145)
Total Bilirubin: 0.8 mg/dL (ref 0.2–1.2)
Total Protein: 7.4 g/dL (ref 6.0–8.3)

## 2021-04-01 LAB — CBC WITH DIFFERENTIAL/PLATELET
Basophils Absolute: 0 10*3/uL (ref 0.0–0.1)
Basophils Relative: 0.7 % (ref 0.0–3.0)
Eosinophils Absolute: 0.2 10*3/uL (ref 0.0–0.7)
Eosinophils Relative: 3 % (ref 0.0–5.0)
HCT: 43.2 % (ref 39.0–52.0)
Hemoglobin: 14.6 g/dL (ref 13.0–17.0)
Lymphocytes Relative: 36.1 % (ref 12.0–46.0)
Lymphs Abs: 2.4 10*3/uL (ref 0.7–4.0)
MCHC: 33.7 g/dL (ref 30.0–36.0)
MCV: 95.4 fl (ref 78.0–100.0)
Monocytes Absolute: 0.6 10*3/uL (ref 0.1–1.0)
Monocytes Relative: 8.4 % (ref 3.0–12.0)
Neutro Abs: 3.4 10*3/uL (ref 1.4–7.7)
Neutrophils Relative %: 51.8 % (ref 43.0–77.0)
Platelets: 155 10*3/uL (ref 150.0–400.0)
RBC: 4.52 Mil/uL (ref 4.22–5.81)
RDW: 13.8 % (ref 11.5–15.5)
WBC: 6.7 10*3/uL (ref 4.0–10.5)

## 2021-04-01 LAB — LIPID PANEL
Cholesterol: 132 mg/dL (ref 0–200)
HDL: 45.9 mg/dL (ref >39.00)
LDL Cholesterol: 56 mg/dL (ref 0–99)
NonHDL: 85.9
Total CHOL/HDL Ratio: 3
Triglycerides: 149 mg/dL (ref 0.0–149.0)
VLDL: 29.8 mg/dL (ref 0.0–40.0)

## 2021-04-01 LAB — HEMOGLOBIN A1C: Hgb A1c MFr Bld: 6.6 % — ABNORMAL HIGH (ref 4.6–6.5)

## 2021-04-04 ENCOUNTER — Encounter: Payer: Self-pay | Admitting: Internal Medicine

## 2021-04-04 ENCOUNTER — Ambulatory Visit: Payer: Medicare Other | Admitting: Internal Medicine

## 2021-04-04 ENCOUNTER — Other Ambulatory Visit: Payer: Self-pay

## 2021-04-04 VITALS — BP 130/84 | HR 89 | Temp 97.7°F | Ht 72.0 in | Wt 213.0 lb

## 2021-04-04 DIAGNOSIS — Z0000 Encounter for general adult medical examination without abnormal findings: Secondary | ICD-10-CM | POA: Diagnosis not present

## 2021-04-04 DIAGNOSIS — E118 Type 2 diabetes mellitus with unspecified complications: Secondary | ICD-10-CM

## 2021-04-04 DIAGNOSIS — R634 Abnormal weight loss: Secondary | ICD-10-CM

## 2021-04-04 DIAGNOSIS — I10 Essential (primary) hypertension: Secondary | ICD-10-CM

## 2021-04-04 DIAGNOSIS — R1084 Generalized abdominal pain: Secondary | ICD-10-CM

## 2021-04-04 MED ORDER — LISINOPRIL 10 MG PO TABS: 10.0000 mg | ORAL_TABLET | Freq: Every day | ORAL | 3 refills | Status: DC

## 2021-04-04 NOTE — Patient Instructions (Signed)
Monitor blood pressure goal <130/<80 increased lisinopril to 10 mg daily

## 2021-04-04 NOTE — Progress Notes (Signed)
Chief Complaint  Patient presents with  . Follow-up   Annual  1. htn on lis 5 mg qd toprol 25 mg qd  2. C/o wt loss from 230s to 215 to 209 not changed diet and not exercising daughter passed 9 months ago and has good and bad days    Review of Systems  Constitutional: Positive for weight loss.  HENT: Negative for hearing loss.   Eyes: Negative for blurred vision.  Respiratory: Negative for shortness of breath.   Cardiovascular: Negative for chest pain.  Gastrointestinal: Negative for abdominal pain.       Denies dysphagia    Musculoskeletal: Negative for falls and joint pain.  Skin: Negative for rash.  Neurological: Negative for headaches.  Psychiatric/Behavioral: Negative for depression and memory loss.   Past Medical History:  Diagnosis Date  . Coronary artery disease    a. 06/2015 Cardiac CT: Ca score 1103 (84th %'ile);  b. 07/2015 Cath: LM 70, LAD 80p, 100/13m D1 70, D2 95, RI 75, RCA 100p/m;  c. 07/2015 CABG x 5 (LIMA->LAD, VG->Diag, VG->OM1->OM2, VG->OM3).  . Diabetes mellitus without complication ([place] 94/0981 . Dyslipidemia   . Essential hypertension   . 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 Fuchs' corneal dystrophy 2016   sees Dr KMaudie Mercury . Fuchs' corneal dystrophy   . GERD (gastroesophageal reflux disease)   . Heart attack ([place]    silent  . Heart disease    history of blood clot in left ventricle per pt   . History of radiation exposure    right vocal cord squamous cell cancer  . Impingement syndrome of right shoulder 10/2015   s/p steroid injection Dr MSabra Heck . Ischemic cardiomyopathy    a. dilated, EF 35% improved to 45-50% (2015);  b. 07/2015 EF 25-35% by LV gram.  . [place] atrial fibrillation ([place] 1983   a. isolated episode[place]  .[place] thrombus of cardiac apex    a. 06/2014: LV; resolved with coumadin-->no residual on f/u echo, no longer on coumadin.  . Osteoarthritis    a. R-shoulder, L-knee (Sabra Heckortho)  . Skin  cancer    squamous and basal cell right forearm, SCC left cheek 10/04/20 sees derm regularly Dr. IKellie Moor  . 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 Thrombocytopenia ([place]   . Vitamin D deficiency    Past Surgical History:  Procedure Laterality Date  . BICEPS TENDON REPAIR Right 1993  . CARDIAC CATHETERIZATION N/A 07/05/2015   Procedure: Left Heart Cath and Coronary Angiography;  Surgeon: TMinna Merritts MD;  [facility];  Service: Cardiovascular;  Laterality: N/A;  . CATARACT EXTRACTION Left 12/2016   with keratoplasty  . COLONOSCOPY  2007  . COLONOSCOPY WITH PROPOFOL N/A 12/02/2017   TA, SSA, rpt 3 yrs(Tahiliani, Varnita B, MD)  . COLONOSCOPY WITH PROPOFOL N/A 11/26/2020   Procedure: COLONOSCOPY WITH PROPOFOL;  Surgeon: WLucilla Lame MD;  [facility];  Service: Endoscopy;  Laterality: N/A;  . CORONARY ARTERY BYPASS GRAFT N/A 07/29/2015   Procedure: CORONARY ARTERY BYPASS GRAFTING (CABG) x 5 (LIMA to LAD, SVG to DIAGONAL, SVG SEQUENTIALLY to OM1 and OM2, SVG to OM3) with Endoscopic Vein Havesting of GREATER SAPHENOUS VEIN from RIGHT THIGH and partial LOWER LEG ;  Surgeon: BGaye Pollack MD;  [facility];  Service: Open Heart  Surgery;  Laterality: N/A;  . EYE SURGERY     b/l cataract and cornea replaced   . HAND SURGERY     left hand 1st/2nd trigger fingers Dr. Sabra Heck ortho   . JOINT REPLACEMENT    . KNEE ARTHROSCOPY Left remote  . MOHS SURGERY     left cheek scc 2022 Dr. Winifred Olive  . MOHS SURGERY     x 5 facial scc  . right biceps tendon     repair/re attachment   . SKIN CANCER EXCISION  10/2015   BCC - L ala (pending MOHs) and L scapula (complete excision)  . TEE WITHOUT CARDIOVERSION N/A 07/29/2015   Procedure: TRANSESOPHAGEAL ECHOCARDIOGRAM (TEE);  Surgeon: [name], MD;  Location: [facility];  Service: Open Heart Surgery;  Laterality: N/A;  . TONSILLECTOMY  1949  .  TOTAL KNEE ARTHROPLASTY Left 03/18/2016   cemented L TKR; Earnestine Leys, MD   Family History  Problem Relation Age of Onset  . CAD Father 46       MI  . Hypertension Father   . Hyperlipidemia Father   . Alcoholism Father   . Diabetes Father   . Cancer Daughter        dx'ed 99 retroperitoneal liposarcoma    Social History   Socioeconomic History  . Marital status: Significant Other    Spouse name: Not on file  . Number of children: Not on file  . Years of education: Not on file  . Highest education level: Not on file  Occupational History  . Not on file  Tobacco Use  . Smoking status: Former Smoker    Packs/day: 0.50    Years: 10.00    Pack years: 5.00    Types: Cigarettes    Quit date: 11/02/1978    Years since quitting: 42.4  . Smokeless tobacco: Never Used  . Tobacco comment: former smoker [phone] 1 pk/week no FH lung cancer   Substance and Sexual Activity  . Alcohol use: Yes    Alcohol/week: 0.0 standard drinks    Comment: beer/wine on weekends  . Drug use: No  . Sexual activity: Yes  Other Topics Concern  . 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 acetaminophen (TYLENOL) 500 MG tablet Take 500 mg by mouth every 6 (six) hours as needed for mild pain.   .Marland Kitchenaspirin 81 MG EC tablet Take 162 mg by mouth daily. Swallow whole.  .Marland Kitchenatorvastatin (LIPITOR) 40 MG tablet Take 1  tablet (40 mg total) by mouth daily.  . Cholecalciferol (VITAMIN D-3) 125 MCG (5000 UT) TABS Take by mouth daily.  . clonazePAM (KLONOPIN) 0.5 MG tablet Take 0.5 tablets (0.25 mg total) by mouth daily as needed for anxiety.  .Marland Kitchenezetimibe (ZETIA) 10 MG tablet TAKE  1 TABLET BY MOUTH EVERY DAY  . famotidine (PEPCID) 20 MG tablet Take 1 tablet (20 mg total) by mouth 2 (two) times daily as needed for heartburn or indigestion. Daily to 2x per day prn. D/c zantac it is discontinued  . fluticasone (FLONASE) 50 MCG/ACT nasal spray Place 1 spray into both nostrils as needed for allergies. Reported on 03/18/2016  . lansoprazole (PREVACID) 30 MG capsule Take 1 capsule (30 mg total) by mouth every morning.  . metFORMIN (GLUCOPHAGE) 500 MG tablet Take 1 tablet (500 mg total) by mouth 2 (two) times daily with a meal.  . metoprolol succinate (TOPROL-XL) 25 MG 24 hr tablet TAKE 1 TABLET BY MOUTH EVERY DAY  . prednisoLONE acetate (PRED FORTE) 1 % ophthalmic suspension Place 1 drop into both eyes once daily  . sildenafil (REVATIO) 20 MG tablet Take 2-4 tablets (40-80 mg total) by mouth daily as needed (relations).  . vitamin B-12 (CYANOCOBALAMIN) 500 MCG tablet Take 500 mcg by mouth daily. Reported on 03/17/2016  . [DISCONTINUED] lisinopril (ZESTRIL) 5 MG tablet Take 1 tablet (5 mg total) by mouth daily.   Allergies  Allergen Reactions  . Pollen Extract Itching   Recent Results (from the past 2160 hour(s))  [facility] DIABETES EYE EXAM     Status: Abnormal   Collection Time: 01/28/21 12:00 AM  Result Value Ref Range   HM Diabetic Eye Exam Retinopathy (A) No Retinopathy    Comment: AE background retinopathy no tx needed f/u 35yr dingeldein  Hemoglobin A1c     Status: Abnormal   Collection Time: 04/01/21  7:49 AM  Result Value Ref Range   Hgb A1c MFr Bld 6.6 (H) 4.6 - 6.5 %    Comment: Glycemic Control Guidelines for People with Diabetes:Non Diabetic:  <6%Goal of Therapy: <7%Additional Action Suggested:  >8%   CBC  w/Diff     Status: None   Collection Time: 04/01/21  7:49 AM  Result Value Ref Range   WBC 6.7 4.0 - 10.5 K/uL   RBC 4.52 4.22 - 5.81 Mil/uL   Hemoglobin 14.6 13.0 - 17.0 g/dL   HCT 43.2 39.0 - 52.0 %   MCV 95.4 78.0 - 100.0 fl   MCHC 33.7 30.0 - 36.0 g/dL   RDW 13.8 11.5 - 15.5 %   Platelets 155.0 150.0 - 400.0 K/uL   Neutrophils Relative % 51.8 43.0 - 77.0 %   Lymphocytes Relative 36.1 12.0 - 46.0 %   Monocytes Relative 8.4 3.0 - 12.0 %   Eosinophils Relative 3.0 0.0 - 5.0 %   Basophils Relative 0.7 0.0 - 3.0 %   Neutro Abs 3.4 1.4 - 7.7 K/uL   Lymphs Abs 2.4 0.7 - 4.0 K/uL   Monocytes Absolute 0.6 0.1 - 1.0 K/uL   Eosinophils Absolute 0.2 0.0 - 0.7 K/uL   Basophils Absolute 0.0 0.0 - 0.1 K/uL  Lipid panel     Status: None   Collection Time: 04/01/21  7:49 AM  Result Value Ref Range   Cholesterol 132 0 - 200 mg/dL    Comment: ATP III Classification       Desirable:  < 200 mg/dL               Borderline High:  200 - 239 mg/dL          High:  > = 240 mg/dL   Triglycerides 149.0 0.0 - 149.0 mg/dL    Comment: Normal:  <150 mg/dLBorderline High:  150 - 199 mg/dL   HDL 45.90 >  39.00 mg/dL   VLDL 29.8 0.0 - 40.0 mg/dL   LDL Cholesterol 56 0 - 99 mg/dL   Total CHOL/HDL Ratio 3     Comment:                Men          Women1/2 Average Risk     3.4          3.3Average Risk          5.0          4.42X Average Risk          9.6          7.13X Average Risk          15.0          11.0                       NonHDL 85.90     Comment: NOTE:  Non-HDL goal should be 30 mg/dL higher than patient's LDL goal (i.e. LDL goal of < 70 mg/dL, would have non-HDL goal of < 100 mg/dL)  Comprehensive metabolic panel     Status: Abnormal   Collection Time: 04/01/21  7:49 AM  Result Value Ref Range   Sodium 136 135 - 145 mEq/L   Potassium 4.0 3.5 - 5.1 mEq/L   Chloride 100 96 - 112 mEq/L   CO2 25 19 - 32 mEq/L   Glucose, Bld 133 (H) 70 - 99 mg/dL   BUN 16 6 - 23 mg/dL   Creatinine, Ser 0.89 0.40 - 1.50  mg/dL   Total Bilirubin 0.8 0.2 - 1.2 mg/dL   Alkaline Phosphatase 56 39 - 117 U/L   AST 17 0 - 37 U/L   ALT 12 0 - 53 U/L   Total Protein 7.4 6.0 - 8.3 g/dL   Albumin 4.4 3.5 - 5.2 g/dL   GFR 82.58 >60.00 mL/min    Comment: Calculated using the CKD-EPI Creatinine Equation (2021)   Calcium 9.6 8.4 - 10.5 mg/dL   Objective  Body mass index is 28.89 kg/m. Wt Readings from Last 3 Encounters:  04/04/21 213 lb (96.6 kg)  11/26/20 211 lb (95.7 kg)  10/04/20 213 lb 12.8 oz (97 kg)   Temp Readings from Last 3 Encounters:  04/04/21 97.7 F (36.5 C) (Oral)  11/26/20 (!) 96.1 F (35.6 C) (Temporal)  10/04/20 97.6 F (36.4 C) (Oral)   BP Readings from Last 3 Encounters:  04/04/21 130/84  11/26/20 (!) 147/90  10/04/20 128/84   Pulse Readings from Last 3 Encounters:  04/04/21 89  11/26/20 66  10/04/20 61    Physical Exam Vitals and nursing note reviewed.  Constitutional:      Appearance: Normal appearance. He is well-developed, well-groomed and overweight.  HENT:     Head: Normocephalic and atraumatic.  Eyes:     Conjunctiva/sclera: Conjunctivae normal.     Pupils: Pupils are equal, round, and reactive to light.  Cardiovascular:     Rate and Rhythm: Normal rate and regular rhythm.     Heart sounds: Normal heart sounds. No murmur heard.   Pulmonary:     Effort: Pulmonary effort is normal.     Breath sounds: Normal breath sounds.  Abdominal:     General: Abdomen is flat. Bowel sounds are normal.  Skin:    General: Skin is warm and dry.  Neurological:     General: No focal deficit present.  Mental Status: He is alert and oriented to person, place, and time. Mental status is at baseline.     Gait: Gait normal.  Psychiatric:        Attention and Perception: Attention and perception normal.        Mood and Affect: Mood and affect normal.        Speech: Speech normal.        Behavior: Behavior normal. Behavior is cooperative.        Thought Content: Thought content  normal.        Cognition and Memory: Cognition and memory normal.        Judgment: Judgment normal.     Assessment  Plan  Annual physical exam Had flu utd, prevnar, pna 23, Tdap, Zoster, Had2/2 shingrix7/3/20 and 2nd dose 07/2019 MMR immune, hep B immune  -Consider hep A vaccine in future h/o fatty liver Hep C negative  Consider prevnar 20 given Rx today 04/04/21  covid 19 vx 2/2 moderna pfizer 1/1 3 doses  total Colonoscopy [facility] 12/02/17 rec repeat in 3 years sessile serrated and tubular sch 11/27/20 EGD/colonoscopy had 11/26/20 colonoscopy Dr. Allen Norris  F/u dermatology Dr. Kellie Moor h/o [facility] [place] 11 or 12/2019did topical chemo in winter 2020rec 04/04/20 Pt call and schedule appt scc left cheek sch [facility] 11/12/20 in [place]   Former smoker 1 pk/week 1967-1980 no FH lung cancer, no chew  If plts continue to be low -will rec hematology in future  [facility] seen 11/07/2018 Dr. Junious Silk BPH with LUTS stable PSA in 6 months PSA was 3.071/2/20 then 3.32 06/29/19  -saw urology 07/11/2019 and will see July 2021 then yearly Dr. Junious Silk  As of 10/07/20 per Dr. Junious Silk He was 3.05 Jun 2019. I said in my Sep 2021 note that PSA was sent this year, but does not look like it was done. I will send him a message to come by my office for a PSA - thanks.    ROI PSA 10/04/20   Vitamin D3 rec 2000 IU qd  ENT-h/o right vocal cord [facility] Saw ENT Dr. Richardson Landry 08/2020 LPR no recurrencescc vocal cordcancer 2008 mild RT changes some mild reflux related to irritation rec PPI pm meals Saw 08/2020 need to get notes   [facility] appt 01/28/21 Dr. Thomasene Ripple  rec healthy diet and exercise  Abnormal weight loss - Plan: DG Chest 2 View, CT Abdomen Pelvis Wo Contrast  Controlled type 2 diabetes mellitus with complication, without long-term current use of insulin ([place]) 04/01/21 6.6 on metformin 500 mg bid - Plan: DG Chest 2 View, CT Abdomen Pelvis Wo Contrast  Essential hypertension  sl elevated - Plan: lisinopril (ZESTRIL) 10 MG tablet increase from 5 mg qd toprol 25 mg xl   Provider: Dr. Olivia Mackie [name]-Internal Medicine

## 2021-04-08 ENCOUNTER — Other Ambulatory Visit: Payer: Self-pay | Admitting: Internal Medicine

## 2021-04-10 ENCOUNTER — Other Ambulatory Visit: Payer: Self-pay | Admitting: *Deleted

## 2021-04-10 MED ORDER — METOPROLOL SUCCINATE ER 25 MG PO TB24: 1.0000 | ORAL_TABLET | Freq: Every day | ORAL | 2 refills | Status: DC

## 2021-04-15 ENCOUNTER — Ambulatory Visit
Admission: RE | Admit: 2021-04-15 | Discharge: 2021-04-15 | Disposition: A | Discharge status: Home / Self Care | Payer: Medicare Other | Source: Ambulatory Visit | Attending: Internal Medicine | Admitting: Internal Medicine

## 2021-04-15 ENCOUNTER — Other Ambulatory Visit: Payer: Self-pay

## 2021-04-15 ENCOUNTER — Ambulatory Visit
Admission: RE | Admit: 2021-04-15 | Discharge: 2021-04-15 | Disposition: A | Discharge status: Home / Self Care | Payer: Medicare Other | Attending: Internal Medicine | Admitting: Internal Medicine

## 2021-04-15 DIAGNOSIS — E118 Type 2 diabetes mellitus with unspecified complications: Secondary | ICD-10-CM | POA: Insufficient documentation

## 2021-04-15 DIAGNOSIS — R634 Abnormal weight loss: Secondary | ICD-10-CM

## 2021-04-15 DIAGNOSIS — R1084 Generalized abdominal pain: Secondary | ICD-10-CM | POA: Diagnosis present

## 2021-04-16 ENCOUNTER — Ambulatory Visit
Admission: RE | Admit: 2021-04-16 | Discharge: 2021-04-16 | Disposition: A | Discharge status: Home / Self Care | Payer: Medicare Other | Source: Ambulatory Visit | Attending: Internal Medicine | Admitting: Internal Medicine

## 2021-04-16 ENCOUNTER — Other Ambulatory Visit: Payer: Self-pay

## 2021-04-16 DIAGNOSIS — R634 Abnormal weight loss: Secondary | ICD-10-CM

## 2021-04-16 DIAGNOSIS — R1084 Generalized abdominal pain: Secondary | ICD-10-CM

## 2021-04-16 DIAGNOSIS — E118 Type 2 diabetes mellitus with unspecified complications: Secondary | ICD-10-CM

## 2021-04-17 ENCOUNTER — Encounter: Payer: Self-pay | Admitting: Internal Medicine

## 2021-04-17 DIAGNOSIS — K5790 Diverticulosis of intestine, part unspecified, without perforation or abscess without bleeding: Secondary | ICD-10-CM | POA: Insufficient documentation

## 2021-04-17 DIAGNOSIS — N200 Calculus of kidney: Secondary | ICD-10-CM

## 2021-04-17 DIAGNOSIS — I700 Atherosclerosis of aorta: Secondary | ICD-10-CM | POA: Insufficient documentation

## 2021-04-17 HISTORY — DX: Calculus of kidney: N20.0

## 2021-04-17 NOTE — Progress Notes (Signed)
Left message to return call 

## 2021-04-18 ENCOUNTER — Telehealth: Payer: Self-pay | Admitting: Internal Medicine

## 2021-04-18 NOTE — Telephone Encounter (Signed)
PT called returning a call about CT results

## 2021-05-01 ENCOUNTER — Other Ambulatory Visit: Payer: Self-pay

## 2021-05-01 ENCOUNTER — Encounter: Payer: Self-pay | Admitting: Internal Medicine

## 2021-05-01 DIAGNOSIS — E119 Type 2 diabetes mellitus without complications: Secondary | ICD-10-CM

## 2021-05-01 MED ORDER — METFORMIN HCL 500 MG PO TABS: 500.0000 mg | ORAL_TABLET | Freq: Two times a day (BID) | ORAL | 3 refills | Status: DC

## 2021-08-08 ENCOUNTER — Other Ambulatory Visit: Payer: Self-pay | Admitting: Urology

## 2021-08-08 DIAGNOSIS — R972 Elevated prostate specific antigen [PSA]: Secondary | ICD-10-CM

## 2021-08-15 ENCOUNTER — Ambulatory Visit: Payer: Medicare Other

## 2021-08-20 ENCOUNTER — Ambulatory Visit
Admission: RE | Admit: 2021-08-20 | Discharge: 2021-08-20 | Disposition: A | Discharge status: Home / Self Care | Payer: Medicare Other | Source: Ambulatory Visit | Attending: Urology | Admitting: Urology

## 2021-08-20 ENCOUNTER — Other Ambulatory Visit: Payer: Self-pay

## 2021-08-20 DIAGNOSIS — R972 Elevated prostate specific antigen [PSA]: Secondary | ICD-10-CM | POA: Diagnosis present

## 2021-08-20 MED ORDER — GADOBUTROL 1 MMOL/ML IV SOLN
9.0000 mL | Freq: Once | INTRAVENOUS | Status: AC | PRN
  Administered 2021-08-20: 9 mL via INTRAVENOUS

## 2021-08-26 DIAGNOSIS — S83249A Other tear of medial meniscus, current injury, unspecified knee, initial encounter: Secondary | ICD-10-CM

## 2021-08-26 DIAGNOSIS — I513 Intracardiac thrombosis, not elsewhere classified: Secondary | ICD-10-CM | POA: Insufficient documentation

## 2021-08-26 DIAGNOSIS — Z9089 Acquired absence of other organs: Secondary | ICD-10-CM | POA: Insufficient documentation

## 2021-08-26 DIAGNOSIS — Z923 Personal history of irradiation: Secondary | ICD-10-CM | POA: Insufficient documentation

## 2021-08-26 HISTORY — DX: Other tear of medial meniscus, current injury, unspecified knee, initial encounter: S83.249A

## 2021-08-26 HISTORY — DX: Acquired absence of other organs: Z90.89

## 2021-08-26 HISTORY — DX: Intracardiac thrombosis, not elsewhere classified: I51.3

## 2021-09-04 ENCOUNTER — Telehealth: Payer: Self-pay | Admitting: Cardiovascular Disease

## 2021-09-04 NOTE — Telephone Encounter (Signed)
Left VM Pt with history of CABG.

## 2021-09-04 NOTE — Telephone Encounter (Signed)
   Name: Shannon Chung  DOB: 04/21/1941  MRN: [id number]   Primary Cardiologist: Ida Rogue, MD  Chart reviewed as part of pre-operative protocol coverage. Patient was contacted 09/04/2021 in reference to pre-operative risk assessment for pending surgery as outlined below.  Shannon Chung was last seen on 09/30/20 by Dr. Rockey Situ.  Since that day, Shannon Chung has done well.  He can complete more than 4.0 METS without angina. He has a history of CABG. Given this, we prefer to continue ASA throughout the perioperative period.   Therefore, based on ACC/AHA guidelines, the patient would be at acceptable risk for the planned procedure without further cardiovascular testing.   The patient was advised that if he develops new symptoms prior to surgery to contact our office to arrange for a follow-up visit, and he verbalized understanding.  I will route this recommendation to the requesting party via Epic fax function and remove from pre-op pool. Please call with questions.  Ledora Bottcher, PA 09/04/2021, 4:47 PM

## 2021-09-04 NOTE — Telephone Encounter (Signed)
   [facility] Pre-operative Risk Assessment    Patient Name: Shannon Chung  DOB: Mar 16, 1941 MRN: [id number]  HEARTCARE STAFF:  - IMPORTANT!!!!!! Under Visit Info/Reason for Call, type in Other and utilize the format Clearance MM/DD/YY or Clearance TBD. Do not use dashes or single digits. - Please review there is not already an duplicate clearance open for this procedure. - If request is for dental extraction, please clarify the # of teeth to be extracted. - If the patient is currently at the dentist's office, call Pre-Op Callback Staff (MA/nurse) to input urgent request.  - If the patient is not currently in the dentist office, please route to the Pre-Op pool.  Request for surgical clearance:  What type of surgery is being performed? MRI Fusion Prostate biopsy  When is this surgery scheduled? 09/17/21  What type of clearance is required (medical clearance vs. Pharmacy clearance to hold med vs. Both)? both  Are there any medications that need to be held prior to surgery and how long? Aspirin 45m 5 days prior  Practice name and name of physician performing surgery? Dr. [facility]  What is the office phone number? [phone]  7.   What is the office fax number? [phone] 8.   Anesthesia type (None, local, MAC, general) ? Not listed   Pilar A Ham 09/04/2021, 10:58 AM  _________________________________________________________________   (provider comments below)

## 2021-09-04 NOTE — Telephone Encounter (Signed)
Patient returning call.

## 2021-09-05 ENCOUNTER — Ambulatory Visit: Payer: Medicare Other

## 2021-09-08 NOTE — Telephone Encounter (Signed)
Lauren calling from [facility]  States they would like clarification on ASA instructions Please call Lauren at [phone] ext [phone]

## 2021-09-09 NOTE — Telephone Encounter (Signed)
Received call from Mickel Baas, all questions answered.

## 2021-09-09 NOTE — Telephone Encounter (Signed)
Left Mickel Baas my phone number for her to contact me regarding questions on this patient

## 2021-09-12 ENCOUNTER — Other Ambulatory Visit: Payer: Self-pay | Admitting: Internal Medicine

## 2021-09-12 DIAGNOSIS — K219 Gastro-esophageal reflux disease without esophagitis: Secondary | ICD-10-CM

## 2021-10-06 ENCOUNTER — Ambulatory Visit
Admission: RE | Admit: 2021-10-06 | Discharge: 2021-10-06 | Disposition: A | Discharge status: Home / Self Care | Payer: Medicare Other | Source: Ambulatory Visit | Attending: Radiation Oncology | Admitting: Radiation Oncology

## 2021-10-06 ENCOUNTER — Other Ambulatory Visit: Payer: Self-pay

## 2021-10-06 ENCOUNTER — Encounter: Payer: Self-pay | Admitting: Radiation Oncology

## 2021-10-06 VITALS — BP 111/65 | HR 72 | Temp 96.7°F | Wt 215.8 lb

## 2021-10-06 DIAGNOSIS — Z87891 Personal history of nicotine dependence: Secondary | ICD-10-CM | POA: Insufficient documentation

## 2021-10-06 DIAGNOSIS — E559 Vitamin D deficiency, unspecified: Secondary | ICD-10-CM | POA: Diagnosis not present

## 2021-10-06 DIAGNOSIS — C801 Malignant (primary) neoplasm, unspecified: Secondary | ICD-10-CM

## 2021-10-06 DIAGNOSIS — I4891 Unspecified atrial fibrillation: Secondary | ICD-10-CM | POA: Insufficient documentation

## 2021-10-06 DIAGNOSIS — Z7982 Long term (current) use of aspirin: Secondary | ICD-10-CM | POA: Diagnosis not present

## 2021-10-06 DIAGNOSIS — D696 Thrombocytopenia, unspecified: Secondary | ICD-10-CM | POA: Diagnosis not present

## 2021-10-06 DIAGNOSIS — Z79899 Other long term (current) drug therapy: Secondary | ICD-10-CM | POA: Insufficient documentation

## 2021-10-06 DIAGNOSIS — Z809 Family history of malignant neoplasm, unspecified: Secondary | ICD-10-CM | POA: Insufficient documentation

## 2021-10-06 DIAGNOSIS — I10 Essential (primary) hypertension: Secondary | ICD-10-CM | POA: Insufficient documentation

## 2021-10-06 DIAGNOSIS — K219 Gastro-esophageal reflux disease without esophagitis: Secondary | ICD-10-CM | POA: Insufficient documentation

## 2021-10-06 DIAGNOSIS — Z85828 Personal history of other malignant neoplasm of skin: Secondary | ICD-10-CM | POA: Diagnosis not present

## 2021-10-06 DIAGNOSIS — C61 Malignant neoplasm of prostate: Secondary | ICD-10-CM | POA: Insufficient documentation

## 2021-10-06 DIAGNOSIS — I2510 Atherosclerotic heart disease of native coronary artery without angina pectoris: Secondary | ICD-10-CM | POA: Insufficient documentation

## 2021-10-06 DIAGNOSIS — E119 Type 2 diabetes mellitus without complications: Secondary | ICD-10-CM | POA: Diagnosis not present

## 2021-10-06 DIAGNOSIS — M1990 Unspecified osteoarthritis, unspecified site: Secondary | ICD-10-CM | POA: Insufficient documentation

## 2021-10-06 DIAGNOSIS — Z7984 Long term (current) use of oral hypoglycemic drugs: Secondary | ICD-10-CM | POA: Insufficient documentation

## 2021-10-06 DIAGNOSIS — E785 Hyperlipidemia, unspecified: Secondary | ICD-10-CM | POA: Diagnosis not present

## 2021-10-06 NOTE — Progress Notes (Signed)
Cardiology Office Note  Date:  10/07/2021   ID:  Shannon Chung, DOB 1941/09/09, MRN [id number]  PCP:  Shannon Chung, Shannon Glow, MD   Chief Complaint  Patient presents with   12 month follow up    "Doing well." Medications reviewed by the patient verbally.     HPI:  Mr. Jandreau is a 80 year old male with  squamous cell carcinoma of the hypopharynx, status post radiation coronary artery disease,   severe multivessel disease, with CABG at [facility] August 2016,  ischemic cardiomyopathy, initial ejection fraction in July 2015 of 35%, up to 55% after CABG cardiac PET scan showing scar in the apical and periapical region,  mural thrombus seen in July 2015, started on anticoagulation,  hyperlipidemia,   who presents for routine follow-up of his coronary artery disease   dx with prostate cancer May need XRT versus seed implants  Active at baseline, Volunteers, 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  PSH:    Past Surgical History:  Procedure Laterality Date   BICEPS TENDON REPAIR Right 1993   CARDIAC CATHETERIZATION N/A 07/05/2015   Procedure: Left Heart Cath and Coronary Angiography;  Surgeon: Minna Merritts, MD;  Location: [place] CV LAB;  Service: Cardiovascular;  Laterality: N/A;   CATARACT EXTRACTION Left 12/2016   with keratoplasty   COLONOSCOPY  2007   COLONOSCOPY WITH PROPOFOL N/A 12/02/2017   TA, SSA, rpt 3 yrs(Tahiliani, Varnita B, MD)   COLONOSCOPY WITH PROPOFOL N/A 11/26/2020   Procedure: COLONOSCOPY WITH PROPOFOL;  Surgeon: [name], MD;  Location: [facility] ENDOSCOPY;  Service: Endoscopy;  Laterality: N/A;   CORONARY ARTERY BYPASS GRAFT N/A 07/29/2015   Procedure: CORONARY ARTERY BYPASS GRAFTING (CABG) x 5 (LIMA to LAD, SVG to DIAGONAL,  SVG SEQUENTIALLY to OM1 and OM2, SVG to OM3) with Endoscopic Vein Havesting of  GREATER SAPHENOUS VEIN from RIGHT THIGH and partial LOWER LEG ;  Surgeon: [name], MD;  Location: [place] OR;  Service: Open Heart Surgery;  Laterality: N/A;   EYE SURGERY     b/l cataract and cornea replaced    HAND SURGERY     left hand 1st/2nd trigger fingers Dr. Sabra Heck ortho    JOINT REPLACEMENT     KNEE ARTHROSCOPY Left remote  MOHS SURGERY     left cheek scc 2022 Dr. Winifred Olive   MOHS SURGERY     x 5 facial scc   right biceps tendon     repair/re attachment    SKIN CANCER EXCISION  10/2015   BCC - L ala (pending MOHs) and L scapula (complete excision)   TEE WITHOUT CARDIOVERSION N/A 07/29/2015   Procedure: TRANSESOPHAGEAL ECHOCARDIOGRAM  (TEE);  Surgeon: [name], MD;  Location: [place];  Service: Open Heart Surgery;  Laterality: N/A;   TONSILLECTOMY  1949   TOTAL KNEE ARTHROPLASTY Left 03/18/2016   cemented L TKR; Earnestine Leys, MD    Current Outpatient Medications  Medication Sig Dispense Refill   acetaminophen (TYLENOL) 500 MG tablet Take 500 mg by mouth every 6 (six) hours as needed for mild pain.      aspirin 81 MG EC tablet Take 162 mg by mouth daily. Swallow whole.     atorvastatin (LIPITOR) 40 MG tablet Take 1 tablet (40 mg total) by mouth daily. 90 tablet 2   Cholecalciferol (VITAMIN D-3) 125 MCG (5000 UT) TABS Take by mouth daily.     clonazePAM (KLONOPIN) 0.5 MG tablet Take 0.5 tablets (0.25 mg total) by mouth daily as needed for anxiety. 15 tablet 2   ezetimibe (ZETIA) 10 MG tablet TAKE 1 TABLET BY MOUTH EVERY DAY 90 tablet 3   famotidine (PEPCID) 20 MG tablet TAKE 1 TABLET BY MOUTH 2 TIMES DAILY AS NEEDED FOR HEARTBURN/INDIGESTION. D/C ZANTAC 180 tablet 3   fluticasone (FLONASE) 50 MCG/ACT nasal spray Place 1 spray into both nostrils as needed for allergies. Reported on 03/18/2016     lansoprazole (PREVACID) 30 MG capsule Take 1 capsule (30 mg total) by mouth every morning. 90 capsule 3   lisinopril (ZESTRIL) 10 MG tablet Take 1 tablet (10 mg total) by mouth daily. 90 tablet 3   metFORMIN (GLUCOPHAGE) 500 MG tablet Take 1 tablet (500 mg total) by mouth 2 (two) times daily with a meal. 180 tablet 3   metoprolol succinate (TOPROL-XL) 25 MG 24 hr tablet Take 1 tablet (25 mg total) by mouth daily. 90 tablet 2   prednisoLONE acetate (PRED FORTE) 1 % ophthalmic suspension Place 1 drop into both eyes once daily     sildenafil (REVATIO) 20 MG tablet Take 2-4 tablets (40-80 mg total) by mouth daily as needed (relations). 30 tablet 11   vitamin B-12 (CYANOCOBALAMIN) 500 MCG tablet Take 500 mcg by mouth daily. Reported on 03/17/2016     No current facility-administered medications for this visit.     Allergies:   Bee  pollen and Pollen extract   Social History:  The patient  reports that he quit smoking about 42 years ago. His smoking use included cigarettes. He has a 5.00 pack-year smoking history. He has never used smokeless tobacco. He reports current alcohol use. He reports that he does not use drugs.   Family History:   family history includes Alcoholism in his father; CAD (age of onset: 30) in his father; Cancer in his daughter; Diabetes in his father; Hyperlipidemia in his father; Hypertension in his father.    Review of Systems: Review of Systems  Constitutional: Negative.   HENT: Negative.    Respiratory: Negative.    Cardiovascular: Negative.   Gastrointestinal: Negative.   Musculoskeletal: Negative.   Neurological: Negative.   Psychiatric/Behavioral: Negative.    All other systems reviewed and are negative.  PHYSICAL EXAM: VS:  BP 130/80 (BP Location: Left Arm, Patient  Position: Sitting, Cuff Size: Normal)   Pulse 70   Ht 6' (1.829 m)   Wt 216 lb 6 oz (98.1 kg)   SpO2 98%   BMI 29.35 kg/m  , BMI Body mass index is 29.35 kg/m.  Constitutional:  oriented to person, place, and time. No distress.  HENT:  Head: Grossly normal Eyes:  no discharge. No scleral icterus.  Neck: No JVD, no carotid bruits  Cardiovascular: Regular rate and rhythm, no murmurs appreciated Pulmonary/Chest: Clear to auscultation bilaterally, no wheezes or rails Abdominal: Soft.  no distension.  no tenderness.  Musculoskeletal: Normal range of motion Neurological:  normal muscle tone. Coordination normal. No atrophy Skin: Skin warm and dry Psychiatric: normal affect, pleasant  Recent Labs: 04/01/2021: ALT 12; BUN 16; Creatinine, Ser 0.89; Hemoglobin 14.6; Platelets 155.0; Potassium 4.0; Sodium 136    Lipid Panel Lab Results  Component Value Date   CHOL 132 04/01/2021   HDL 45.90 04/01/2021   LDLCALC 56 04/01/2021   TRIG 149.0 04/01/2021    Wt Readings from Last 3 Encounters:  10/07/21 216 lb 6 oz  (98.1 kg)  10/06/21 215 lb 12.8 oz (97.9 kg)  04/04/21 213 lb (96.6 kg)     ASSESSMENT AND PLAN:  Ischemic cardiomyopathy - Plan: EKG 12-Lead  euvolemic, EF 55% Denies anginal symptoms, appears euvolemic  Mixed hyperlipidemia - Plan: EKG 12-Lead Goal LDL 60 or less Continue atorvastatin, Zetia, no changes made  Essential hypertension - Plan: EKG 12-Lead Blood pressure is well controlled on today's visit. Stable, no changes made  Prostate cancer Estimated decision between radiation versus seed implantation  Hx of CABG - Plan: EKG 12-Lead As above, no anginal symptoms  PVCs  No symptoms, stay on metoprolol Denies palpitations  Obesity Weight stable, good activity level   Total encounter time more than 25 minutes  Greater than 50% was spent in counseling and coordination of care with the patient    Orders Placed This Encounter  Procedures   EKG 12-Lead      Signed, Esmond Plants, M.D., Ph.D. 10/07/2021  [place], [place]

## 2021-10-06 NOTE — Consult Note (Signed)
NEW PATIENT EVALUATION  Name: Shannon Chung  MRN: [id number]  Date:   10/06/2021     DOB: 09-Sep-1941   This 80 y.o. 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  REFERRING PHYSICIAN: [name], Olivia Mackie *  CHIEF COMPLAINT:  Chief Complaint  Patient presents with   Prostate Cancer    DIAGNOSIS: The encounter diagnosis was Cancer [facility]).   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  He had an MRI of his prostate showing 2 PI-RADS category 4 lesions in the peripheral zone.  He underwent transrectal ultrasound-guided biopsy showing 5 of 12 cores positive for adenocarcinoma mostly Gleason 7 (3+4) although there was 2 lesions Gleason 6 (3+3).  His prostate gland measures approximately 42 cc.  He is discussed treatment options with urology is now referred to radiation oncology for consideration of treatment.  He has very low side effect profile from his prostate cancer.  No significant urgency frequency.  He has nocturia x1.  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    PAST SURGICAL HISTORY:  Past Surgical History:  Procedure Laterality Date   BICEPS TENDON REPAIR Right 1993   CARDIAC CATHETERIZATION N/A 07/05/2015   Procedure: Left Heart Cath and Coronary Angiography;  Surgeon: Minna Merritts, MD;  Location: [place] CV LAB;  Service: Cardiovascular;  Laterality: N/A;   CATARACT EXTRACTION Left 12/2016   with keratoplasty   COLONOSCOPY  2007   COLONOSCOPY WITH PROPOFOL N/A 12/02/2017   TA, SSA, rpt 3 yrs(Tahiliani, Varnita B, MD)   COLONOSCOPY WITH PROPOFOL N/A 11/26/2020   Procedure: COLONOSCOPY WITH PROPOFOL;  Surgeon: [name], MD;  [facility] ENDOSCOPY;  Service: Endoscopy;  Laterality: N/A;   CORONARY ARTERY BYPASS GRAFT N/A 07/29/2015   Procedure: CORONARY ARTERY BYPASS GRAFTING (CABG) x 5 (LIMA to LAD, SVG to DIAGONAL,  SVG SEQUENTIALLY to OM1 and OM2, SVG to OM3) with Endoscopic Vein Havesting of  GREATER SAPHENOUS VEIN from RIGHT THIGH and partial LOWER LEG ;  Surgeon: [name], MD;  Location: [place] OR;  Service: Open Heart Surgery;  Laterality: N/A;   EYE SURGERY     b/l cataract and cornea replaced    HAND SURGERY     left hand 1st/2nd trigger fingers Dr. Sabra Heck ortho    JOINT REPLACEMENT     KNEE ARTHROSCOPY Left remote   MOHS SURGERY     left cheek scc 2022 Dr. Winifred Olive   MOHS SURGERY     x 5 facial scc   right biceps tendon     repair/re attachment    SKIN CANCER EXCISION  10/2015   BCC - L ala (pending  MOHs) and L scapula (complete excision)   TEE WITHOUT CARDIOVERSION N/A 07/29/2015   Procedure: TRANSESOPHAGEAL ECHOCARDIOGRAM (TEE);  Surgeon: [name], MD;  Location: [place];  Service: Open Heart Surgery;  Laterality: N/A;   TONSILLECTOMY  1949   TOTAL KNEE ARTHROPLASTY Left 03/18/2016   cemented L TKR; Earnestine Leys, MD    FAMILY HISTORY: family history includes Alcoholism in his  father; CAD (age of onset: 30) in his father; Cancer in his daughter; Diabetes in his father; Hyperlipidemia in his father; Hypertension in his father.  SOCIAL HISTORY:  reports that he quit smoking about 42 years ago. His smoking use included cigarettes. He has a 5.00 pack-year smoking history. He has never used smokeless tobacco. He reports current alcohol use. He reports that he does not use drugs.  ALLERGIES: Pollen extract  MEDICATIONS:  Current Outpatient Medications  Medication Sig Dispense Refill   acetaminophen (TYLENOL) 500 MG tablet Take 500 mg by mouth every 6 (six) hours as needed for mild pain.      aspirin 81 MG EC tablet Take 162 mg by mouth daily. Swallow whole.     atorvastatin (LIPITOR) 40 MG tablet Take 1 tablet (40 mg total) by mouth daily. 90 tablet 2   Cholecalciferol (VITAMIN D-3) 125 MCG (5000 UT) TABS Take by mouth daily.     clonazePAM (KLONOPIN) 0.5 MG tablet Take 0.5 tablets (0.25 mg total) by mouth daily as needed for anxiety. 15 tablet 2   ezetimibe (ZETIA) 10 MG tablet TAKE 1 TABLET BY MOUTH EVERY DAY 90 tablet 3   famotidine (PEPCID) 20 MG tablet TAKE 1 TABLET BY MOUTH 2 TIMES DAILY AS NEEDED FOR HEARTBURN/INDIGESTION. D/C ZANTAC 180 tablet 3   fluticasone (FLONASE) 50 MCG/ACT nasal spray Place 1 spray into both nostrils as needed for allergies. Reported on 03/18/2016     lansoprazole (PREVACID) 30 MG capsule Take 1 capsule (30 mg total) by mouth every morning. 90 capsule 3   lisinopril (ZESTRIL) 10 MG tablet Take 1 tablet (10 mg total) by mouth daily. 90 tablet 3   metFORMIN (GLUCOPHAGE) 500 MG tablet Take 1 tablet (500 mg total) by mouth 2 (two) times daily with a meal. 180 tablet 3   metoprolol succinate (TOPROL-XL) 25 MG 24 hr tablet Take 1 tablet (25 mg total) by mouth daily. 90 tablet 2   prednisoLONE acetate (PRED FORTE) 1 % ophthalmic suspension Place 1 drop into both eyes once daily     sildenafil (REVATIO) 20 MG tablet Take 2-4 tablets (40-80 mg total)  by mouth daily as needed (relations). 30 tablet 11   vitamin B-12 (CYANOCOBALAMIN) 500 MCG tablet Take 500 mcg by mouth daily. Reported on 03/17/2016     amoxicillin (AMOXIL) 500 MG capsule amoxicillin 500 mg capsule (Patient not taking: Reported on 04/04/2021)     No current facility-administered medications for this encounter.    ECOG PERFORMANCE STATUS:  0 - Asymptomatic  REVIEW OF SYSTEMS: Does have history of squamous cell carcinoma of the larynx sounds like early stage I disease status post external beam treatment. Patient denies any weight loss, fatigue, weakness, fever, chills or night sweats. Patient denies any loss of vision, blurred vision. Patient denies any ringing  of the ears or hearing loss. No irregular heartbeat. Patient denies heart murmur or history of fainting. Patient denies any chest pain or pain radiating to her upper extremities. Patient denies any shortness of breath, difficulty breathing at night, cough or hemoptysis. Patient denies any swelling in the  lower legs. Patient denies any nausea vomiting, vomiting of blood, or coffee ground material in the vomitus. Patient denies any stomach pain. Patient states has had normal bowel movements no significant constipation or diarrhea. Patient denies any dysuria, hematuria or significant nocturia. Patient denies any problems walking, swelling in the joints or loss of balance. Patient denies any skin changes, loss of hair or loss of weight. Patient denies any excessive worrying or anxiety or significant depression. Patient denies any problems with insomnia. Patient denies excessive thirst, polyuria, polydipsia. Patient denies any swollen glands, patient denies easy bruising or easy bleeding. Patient denies any recent infections, allergies or URI. Patient "s visual fields have not changed significantly in recent time.   PHYSICAL EXAM: BP 111/65   Pulse 72   Temp (!) 96.7 F (35.9 C) (Tympanic)   Wt 215 lb 12.8 oz (97.9 kg)   BMI 29.27  kg/m  Well-developed well-nourished patient in NAD. HEENT reveals PERLA, EOMI, discs not visualized.  Oral cavity is clear. No oral mucosal lesions are identified. Neck is clear without evidence of cervical or supraclavicular adenopathy. Lungs are clear to A&P. Cardiac examination is essentially unremarkable with regular rate and rhythm without murmur rub or thrill. Abdomen is benign with no organomegaly or masses noted. Motor sensory and DTR levels are equal and symmetric in the upper and lower extremities. Cranial nerves II through XII are grossly intact. Proprioception is intact. No peripheral adenopathy or edema is identified. No motor or sensory levels are noted. Crude visual fields are within normal range.  LABORATORY DATA: Pathology report reviewed compatible with above-stated findings    RADIOLOGY RESULTS: MRI of prostate reviewed compatible with above-stated findings   IMPRESSION: Stage IIa Gleason 7 (3+4) adenocarcinoma the prostate in 80 year old male  PLAN: At this time based on the PSA pathology reports and clinical findings believe he is low risk for pelvic or visceral metastasis.  I have recommended image guided IMRT radiation therapy.  I plan on delivering 57 Pearline Cables to his prostate over 8 weeks.  I have asked urology to place fiducial markers in his prostate for daily image guided treatment.  Patient also would benefit from 1 dose of Eligard and also ask urology to arrange that.  Risks and benefits of treatment including increased lower urinary tract symptoms diarrhea fatigue alteration of blood counts skin reaction all were reviewed in detail.  He comprehends my treatment plan well.  We will schedule simulation after markers are placed.  I would like to take this opportunity to thank you for allowing me to participate in the care of your patient.Noreene Filbert, MD

## 2021-10-07 ENCOUNTER — Ambulatory Visit: Payer: Medicare Other | Admitting: Cardiovascular Disease

## 2021-10-07 ENCOUNTER — Encounter: Payer: Self-pay | Admitting: Internal Medicine

## 2021-10-07 ENCOUNTER — Encounter: Payer: Self-pay | Admitting: Cardiovascular Disease

## 2021-10-07 ENCOUNTER — Ambulatory Visit: Payer: Medicare Other | Admitting: Internal Medicine

## 2021-10-07 VITALS — BP 130/80 | HR 70 | Ht 72.0 in | Wt 216.4 lb

## 2021-10-07 VITALS — BP 114/72 | HR 69 | Temp 96.9°F | Ht 72.0 in | Wt 217.0 lb

## 2021-10-07 DIAGNOSIS — I25118 Atherosclerotic heart disease of native coronary artery with other forms of angina pectoris: Secondary | ICD-10-CM | POA: Diagnosis not present

## 2021-10-07 DIAGNOSIS — M47816 Spondylosis without myelopathy or radiculopathy, lumbar region: Secondary | ICD-10-CM | POA: Insufficient documentation

## 2021-10-07 DIAGNOSIS — E119 Type 2 diabetes mellitus without complications: Secondary | ICD-10-CM

## 2021-10-07 DIAGNOSIS — I10 Essential (primary) hypertension: Secondary | ICD-10-CM | POA: Diagnosis not present

## 2021-10-07 DIAGNOSIS — I255 Ischemic cardiomyopathy: Secondary | ICD-10-CM

## 2021-10-07 DIAGNOSIS — I152 Hypertension secondary to endocrine disorders: Secondary | ICD-10-CM

## 2021-10-07 DIAGNOSIS — R7303 Prediabetes: Secondary | ICD-10-CM

## 2021-10-07 DIAGNOSIS — E782 Mixed hyperlipidemia: Secondary | ICD-10-CM | POA: Diagnosis not present

## 2021-10-07 DIAGNOSIS — C61 Malignant neoplasm of prostate: Secondary | ICD-10-CM | POA: Insufficient documentation

## 2021-10-07 DIAGNOSIS — M5136 Other intervertebral disc degeneration, lumbar region: Secondary | ICD-10-CM | POA: Insufficient documentation

## 2021-10-07 DIAGNOSIS — R5383 Other fatigue: Secondary | ICD-10-CM

## 2021-10-07 DIAGNOSIS — E559 Vitamin D deficiency, unspecified: Secondary | ICD-10-CM

## 2021-10-07 DIAGNOSIS — I493 Ventricular premature depolarization: Secondary | ICD-10-CM

## 2021-10-07 DIAGNOSIS — Z951 Presence of aortocoronary bypass graft: Secondary | ICD-10-CM

## 2021-10-07 DIAGNOSIS — E1159 Type 2 diabetes mellitus with other circulatory complications: Secondary | ICD-10-CM

## 2021-10-07 DIAGNOSIS — I700 Atherosclerosis of aorta: Secondary | ICD-10-CM

## 2021-10-07 HISTORY — DX: Malignant neoplasm of prostate: C61

## 2021-10-07 MED ORDER — ATORVASTATIN CALCIUM 40 MG PO TABS: 40.0000 mg | ORAL_TABLET | Freq: Every day | ORAL | 3 refills | Status: DC

## 2021-10-07 MED ORDER — EZETIMIBE 10 MG PO TABS: 10.0000 mg | ORAL_TABLET | Freq: Every day | ORAL | 3 refills | Status: DC

## 2021-10-07 NOTE — Patient Instructions (Addendum)
Oncology Diagnoses   Malignant neoplasm of prostate (CMS-HCC)   Prostate cancer     Imagene Gurney, MD   [facility] [place], [place] [address]   Phone: [phone]   Fax: [phone]    Dr. Lawanna Kobus [facility] oncology

## 2021-10-07 NOTE — Progress Notes (Signed)
Chief Complaint  Patient presents with   Follow-up   F/u  1. Prostate cancer new dx 10 or 09/2021 sees urology Dr. Eda Keys and had consult with radiation oncology yesterday Dr. Noreene Filbert to consider tx options  Review of Systems  Constitutional:  Negative for weight loss.  HENT:  Negative for hearing loss.   Eyes:  Negative for blurred vision.  Respiratory:  Negative for shortness of breath.   Cardiovascular:  Negative for chest pain.  Gastrointestinal:  Negative for abdominal pain and blood in stool.  Musculoskeletal:  Negative for back pain.  Skin:  Negative for rash.  Neurological:  Negative for headaches.  Psychiatric/Behavioral:  Negative for depression.   Past Medical History:  Diagnosis Date   Coronary artery disease    a. 06/2015 Cardiac CT: Ca score 1103 (84th %'ile);  b. 07/2015 Cath: LM 70, LAD 80p, 100/28m D1 70, D2 95, RI 75, RCA 100p/m;  c. 07/2015 CABG x 5 (LIMA->LAD, VG->Diag, VG->OM1->OM2, VG->OM3).   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 dilated, EF 35% improved to 45-50% (2015);  b. 07/2015 EF 25-35% by LV gram.   [place] atrial fibrillation ([place] 1983   a. isolated episode, [place]   [place] thrombus of cardiac apex    a. 06/2014: LV; resolved with coumadin-->no residual on f/u echo, no longer on coumadin.   Osteoarthritis    a. R-shoulder, L-knee (Sabra Heckortho)   Skin cancer    squamous and basal cell right forearm, SCC left  cheek 10/04/20 sees derm regularly Dr. 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   XRT; right vocal cord; [place]   [place]ocyt[place] ([place]    Vitamin D deficiency    Past Surgical History:  Procedure Laterality Date   BICEPS TENDON REPAIR Right [place]/A 07/05/2015   Procedure: Left Heart Cath and Coronary Angiography;  Surgeon: TMinna Merritts MD;  [facility];  Service: Cardiovascular;  Laterality: N/A;   CATARACT EXTRACTION Left 12/2016   with keratoplasty   COLONOSCOPY  2007   COLONOSCOPY WITH PROPOFOL N/A 12/02/2017   TA, SSA, rpt 3 yrs(Tahiliani, Varnita B, MD)   COLONOSCOPY WITH PROPOFOL N/A 11/26/2020   Procedure: COLONOSCOPY WITH PROPOFOL;  Surgeon: WLucilla Lame MD;  [facility];  Service: Endoscopy;  Laterality: N/A;   CORONARY ARTERY BYPASS GRAFT N/A 07/29/2015   Procedure: CORONARY ARTERY BYPASS GRAFTING (CABG) x 5 (LIMA to LAD, SVG to DIAGONAL,  SVG SEQUENTIALLY to OM1 and OM2, SVG to OM3) with Endoscopic Vein Havesting of  GREATER SAPHENOUS VEIN from RIGHT THIGH and partial LOWER LEG ;  Surgeon: BGaye Pollack MD;  [place];  Service: Open Heart Surgery;  Laterality: N/A;   EYE SURGERY     b/l  cataract and cornea replaced    HAND SURGERY     left hand 1st/2nd trigger fingers Dr. Sabra Heck ortho    JOINT REPLACEMENT     KNEE ARTHROSCOPY Left remote   MOHS SURGERY     left cheek scc 2022 Dr. Winifred Olive   MOHS SURGERY     x 5 facial scc   right biceps tendon     repair/re attachment    SKIN CANCER EXCISION  10/2015   BCC - L ala (pending MOHs) and L scapula (complete excision)   TEE WITHOUT CARDIOVERSION N/A 07/29/2015   Procedure: TRANSESOPHAGEAL ECHOCARDIOGRAM (TEE);  Surgeon: [name], MD;  Location: [place];  Service: Open Heart Surgery;  Laterality: N/A;   TONSILLECTOMY  1949   TOTAL KNEE ARTHROPLASTY Left 03/18/2016   cemented L TKR; Earnestine Leys, MD    Family History  Problem Relation Age of Onset   CAD Father 88       MI   Hypertension Father    Hyperlipidemia Father    Alcoholism Father    Diabetes Father    Cancer Daughter        dx'ed 67 retroperitoneal liposarcoma    Social History   Socioeconomic History   Marital status: Significant Other    Spouse name: Not on file   Number of children: Not on file   Years of education: Not on file   Highest education level: Not on file  Occupational History   Not on file  Tobacco Use   Smoking status: Former    Packs/day: 0.50    Years: 10.00    Pack years: 5.00    Types: Cigarettes    Quit date: 11/02/1978    Years since quitting: 42.9   Smokeless tobacco: Never   Tobacco comments:    former smoker 1967-1980 1 pk/week no FH lung cancer   Vaping Use   Vaping Use: Never used  Substance and Sexual Activity   Alcohol use: Yes    Alcohol/week: 0.0 standard drinks    Comment: beer/wine on weekends   Drug use: No   Sexual activity: Yes  Other Topics Concern   Not on file  Social History Narrative   Lives with fiancee for 53yr (Metta Clines   [place]    Divorced   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    aspirin 81 MG EC tablet Take 162 mg by mouth daily. Swallow whole.   atorvastatin (LIPITOR) 40 MG tablet Take 1 tablet (40 mg total) by mouth daily.   Cholecalciferol (VITAMIN  D-3) 125 MCG (5000 UT) TABS Take by mouth daily.   ezetimibe (ZETIA) 10 MG tablet Take 1 tablet (10 mg total) by mouth daily.   famotidine (PEPCID) 20 MG tablet TAKE 1 TABLET BY MOUTH 2 TIMES DAILY  AS NEEDED FOR HEARTBURN/INDIGESTION. D/C ZANTAC   fluticasone (FLONASE) 50 MCG/ACT nasal spray Place 1 spray into both nostrils as needed for allergies. Reported on 03/18/2016   lansoprazole (PREVACID) 30 MG capsule Take 1 capsule (30 mg total) by mouth every morning.   lisinopril (ZESTRIL) 10 MG tablet Take 1 tablet (10 mg total) by mouth daily.   metFORMIN (GLUCOPHAGE) 500 MG tablet Take 1 tablet (500 mg total) by mouth 2 (two) times daily with a meal.   metoprolol succinate (TOPROL-XL) 25 MG 24 hr tablet Take 1 tablet (25 mg total) by mouth daily.   prednisoLONE acetate (PRED FORTE) 1 % ophthalmic suspension Place 1 drop into both eyes once daily   sildenafil (REVATIO) 20 MG tablet Take 2-4 tablets (40-80 mg total) by mouth daily as needed (relations).   vitamin B-12 (CYANOCOBALAMIN) 500 MCG tablet Take 500 mcg by mouth daily. Reported on 03/17/2016   Allergies  Allergen Reactions   Bee Pollen Itching   Pollen Extract Itching   No results found for this or any previous visit (from the past 2160 hour(s)). Objective  Body mass index is 29.43 kg/m. Wt Readings from Last 3 Encounters:  10/07/21 217 lb (98.4 kg)  10/07/21 216 lb 6 oz (98.1 kg)  10/06/21 215 lb 12.8 oz (97.9 kg)   Temp Readings from Last 3 Encounters:  10/07/21 (!) 96.9 F (36.1 C) (Temporal)  10/06/21 (!) 96.7 F (35.9 C) (Tympanic)  04/04/21 97.7 F (36.5 C) (Oral)   BP Readings from Last 3 Encounters:  10/07/21 114/72  10/07/21 130/80  10/06/21 111/65   Pulse Readings from Last 3 Encounters:  10/07/21 69  10/07/21 70  10/06/21 72    Physical Exam Vitals and nursing note reviewed.  Constitutional:      Appearance: Normal appearance. He is well-developed and well-groomed. He is obese.  HENT:     Head:  Normocephalic and atraumatic.  Eyes:     Conjunctiva/sclera: Conjunctivae normal.     Pupils: Pupils are equal, round, and reactive to light.  Cardiovascular:     Rate and Rhythm: Normal rate and regular rhythm.     Heart sounds: Normal heart sounds.  Pulmonary:     Effort: Pulmonary effort is normal. No respiratory distress.     Breath sounds: Normal breath sounds.  Abdominal:     Tenderness: There is no abdominal tenderness.  Musculoskeletal:     Lumbar back: Tenderness present. Negative right straight leg raise test and negative left straight leg raise test.  Skin:    General: Skin is warm and moist.  Neurological:     General: No focal deficit present.     Mental Status: He is alert and oriented to person, place, and time. Mental status is at baseline.     Sensory: Sensation is intact.     Motor: Motor function is intact.     Coordination: Coordination is intact.     Gait: Gait is intact. Gait normal.  Psychiatric:        Attention and Perception: Attention and perception normal.        Mood and Affect: Mood and affect normal.        Speech: Speech normal.        Behavior: Behavior normal. Behavior is cooperative.        Thought Content: Thought content normal.        Cognition and Memory: Cognition and memory normal.        Judgment: Judgment normal.  Assessment  Plan  Prostate cancer [facility]) - Plan: Ambulatory referral to Hematology / Oncology [facility] Dr. Lawanna Kobus per my recs to review all tx options  Cc Dr. Junious Silk  Vitamin D deficiency - Plan: Vitamin D (25 hydroxy)   Hypertension associated with diabetes hld controlled  - Plan: Comprehensive metabolic panel, Lipid panel, CBC with Differential/Platelet, Urinalysis, Routine w reflex microscopic, Hemoglobin A1c, Microalbumin / creatinine urine ratio Zetia 10 mg qd lipitor 40 mg qhs metformin  500 mg bid, toprol xl 25 mg qd lis 10 mg qd   HM Had flu utd, prevnar, pna 23, Tdap, Zoster,  Had 2/2 shingrix 05/05/19  and 2nd dose 07/2019  MMR immune, hep B immune  -Consider hep A vaccine in future h/o fatty liver   Hep C negative  Consider prevnar 20 given Rx today 04/04/21   covid 19 vx 4/ moderna pfizer 1/1 total 5 doses  total Colonoscopy [facility] 12/02/17 rec repeat in 3 years sessile serrated and tubular sch 11/27/20 EGD/colonoscopy had 11/26/20 colonoscopy Dr. Allen Norris 11/27/2023     F/u dermatology Dr. Kellie Moor h/o [facility], SCC appt 11 or 10/2018 did topical chemo in winter 2020 rec 04/04/20  Pt call and schedule appt  scc left cheek sch mohs 11/12/20 in [place] seen 12/2021    Former smoker 1 pk/week 1967-1980 no FH lung cancer, no chew    If plts continue to be low  -will rec hematology in future    [facility] seen 11/07/2018 Dr. Junious Silk BPH with LUTS stable PSA in 6 months PSA was 3.07 11/03/18 then 3.32 06/29/19  -saw urology 07/11/2019 and will see July 2021 then yearly Dr. Junious Silk  As of 10/07/20 per Dr. Junious Silk He was 3.05 Jun 2019. I said in my Sep 2021 note that PSA was sent this year, but does not look like it was done. I will send him a message to come by my office for a PSA - thanks.  Prostate MRI 08/20/21 +prostate cancer 09/2021 considering radiation instead of seeds   ROI PSA 10/04/20    Vitamin D3 rec 2000 IU qd     ENT-h/o right vocal cord [facility]  Saw ENT Dr. Richardson Landry 08/2020 LPR no recurrence scc vocal cord cancer 2008 mild RT changes some mild reflux related to irritation rec PPI pm meals  Saw 08/2020 need to get notes     eye sch appt 01/28/21 Dr. Thomasene Ripple   rec healthy diet and exercise   Provider: Dr. Olivia Mackie [name]-Internal Medicine

## 2021-10-07 NOTE — Patient Instructions (Addendum)
Medication Instructions:  No changes  If you need a refill on your cardiac medications before your next appointment, please call your pharmacy.   Lab work: No new labs needed  Testing/Procedures: No new testing needed  Follow-Up: At [facility], you and your health needs are our priority.  As part of our continuing mission to provide you with exceptional heart care, we have created designated Provider Care Teams.  These Care Teams include your primary Cardiologist (physician) and Advanced Practice Providers (APPs -  Physician Assistants and Nurse Practitioners) who all work together to provide you with the care you need, when you need it.  You will need a follow up appointment in 12 months  Providers on your designated Care Team:   Christopher Berge, NP Ryan Dunn, PA-C Cadence Furth, PA-C  COVID-19 Vaccine Information can be found at: [email or web address] For questions related to vaccine distribution or appointments, please email [email or web address] or call [phone].   

## 2021-10-13 ENCOUNTER — Telehealth: Payer: Self-pay | Admitting: Radiation Oncology

## 2021-10-15 ENCOUNTER — Encounter: Payer: Self-pay | Admitting: Internal Medicine

## 2021-10-22 NOTE — Progress Notes (Signed)
GU Location of Tumor / Histology: Prostate Ca  If Prostate Cancer, Gleason Score is (4 + 3) and PSA is (4.77 as of 09/2021)  Biopsies   Dr. Junious Silk       Past/Anticipated interventions by urology, if any:     Past/Anticipated interventions by medical oncology, if any:   Weight changes, if any:   Weight is stable, and lost 15 pounds from change in diet/exercise.  IPSS:  2 SHIM:  19  Bowel/Bladder complaints, if any:  No bowel or bladder issues.  Nausea/Vomiting, if any:   No  Pain issues, if any:  0/10  SAFETY ISSUES: Prior radiation?   Hx of radiation exposure Squamous cell CA of vocal cords (2008). Pacemaker/ICD?  No Possible current pregnancy?  Male Is the patient on methotrexate?   No  Current Complaints / other details:  Learn more about treatment options.

## 2021-10-28 ENCOUNTER — Ambulatory Visit
Admission: RE | Admit: 2021-10-28 | Discharge: 2021-10-28 | Disposition: A | Discharge status: Home / Self Care | Payer: Medicare Other | Source: Ambulatory Visit | Attending: Radiation Oncology | Admitting: Radiation Oncology

## 2021-10-28 ENCOUNTER — Other Ambulatory Visit: Payer: Self-pay

## 2021-10-28 VITALS — BP 147/90 | HR 70 | Temp 97.6°F | Resp 20 | Ht 72.0 in | Wt 218.2 lb

## 2021-10-28 DIAGNOSIS — Z85828 Personal history of other malignant neoplasm of skin: Secondary | ICD-10-CM | POA: Diagnosis not present

## 2021-10-28 DIAGNOSIS — K219 Gastro-esophageal reflux disease without esophagitis: Secondary | ICD-10-CM | POA: Diagnosis not present

## 2021-10-28 DIAGNOSIS — Z923 Personal history of irradiation: Secondary | ICD-10-CM | POA: Diagnosis not present

## 2021-10-28 DIAGNOSIS — Z87891 Personal history of nicotine dependence: Secondary | ICD-10-CM | POA: Diagnosis not present

## 2021-10-28 DIAGNOSIS — Z7982 Long term (current) use of aspirin: Secondary | ICD-10-CM | POA: Diagnosis not present

## 2021-10-28 DIAGNOSIS — Z79899 Other long term (current) drug therapy: Secondary | ICD-10-CM | POA: Insufficient documentation

## 2021-10-28 DIAGNOSIS — I252 Old myocardial infarction: Secondary | ICD-10-CM | POA: Insufficient documentation

## 2021-10-28 DIAGNOSIS — Z809 Family history of malignant neoplasm, unspecified: Secondary | ICD-10-CM | POA: Diagnosis not present

## 2021-10-28 DIAGNOSIS — E559 Vitamin D deficiency, unspecified: Secondary | ICD-10-CM | POA: Insufficient documentation

## 2021-10-28 DIAGNOSIS — I4891 Unspecified atrial fibrillation: Secondary | ICD-10-CM | POA: Diagnosis not present

## 2021-10-28 DIAGNOSIS — C61 Malignant neoplasm of prostate: Secondary | ICD-10-CM | POA: Insufficient documentation

## 2021-10-28 DIAGNOSIS — I10 Essential (primary) hypertension: Secondary | ICD-10-CM | POA: Insufficient documentation

## 2021-10-28 DIAGNOSIS — M1990 Unspecified osteoarthritis, unspecified site: Secondary | ICD-10-CM | POA: Diagnosis not present

## 2021-10-28 DIAGNOSIS — I255 Ischemic cardiomyopathy: Secondary | ICD-10-CM | POA: Insufficient documentation

## 2021-10-28 DIAGNOSIS — D696 Thrombocytopenia, unspecified: Secondary | ICD-10-CM | POA: Diagnosis not present

## 2021-10-28 DIAGNOSIS — E785 Hyperlipidemia, unspecified: Secondary | ICD-10-CM | POA: Insufficient documentation

## 2021-10-28 DIAGNOSIS — E119 Type 2 diabetes mellitus without complications: Secondary | ICD-10-CM | POA: Diagnosis not present

## 2021-10-28 DIAGNOSIS — I2510 Atherosclerotic heart disease of native coronary artery without angina pectoris: Secondary | ICD-10-CM | POA: Diagnosis not present

## 2021-10-28 NOTE — Progress Notes (Signed)
Radiation Oncology         [phone] ________________________________  Initial Outpatient Consultation  Name: Shannon Chung MRN: [id number]  Date: 10/28/2021  DOB: 04/04/1941  CC:Chung, Shannon Glow, Chung  Shannon Aloe, Chung   REFERRING PHYSICIAN: Festus Aloe, Chung  DIAGNOSIS: 80 y.o. gentleman with Stage T1c adenocarcinoma of the prostate with Gleason score of 3+4, and PSA of 4.77.    ICD-10-CM   1. Malignant neoplasm of prostate (Shannon Chung)  C61       HISTORY OF PRESENT ILLNESS: Shannon Chung is a 80 y.o. male with a diagnosis of prostate cancer. He has been followed by Shannon. Junious Chung since 2018 for BPH and has been noted to have a rising PSA since December 2021 when his PSA was 4.01, up from 3.32 in August 2020. His PSA increased to 4.14 in March 2022 and up to 4.77 in 07/2021. Digital rectal examination was performed at that time revealing no nodules. The rising PSA prompted further evaluation with prostate MRI which was performed on 08/20/21 showing two PI-RADS 4 lesions in the peripheral zone bilaterally in the mid gland.  He proceeded to MRI fusion transrectal ultrasound with 16 biopsies of the prostate on 09/17/21.  The prostate volume measured 41.65 cc.  Out of 16 core biopsies, 7 were positive.  The maximum Gleason score was 3+4, and this was seen in the left apex lateral, left mid lateral (with perineural invasion), left base lateral, and one of two samples from MRI ROI lesion #1 on the left. Additionally, small foci of Gleason 3+3 were seen in one of two samples from the MRI ROI lesion #2 on the right, as well as the left mid, and right apex course.  He was seen in consultation by Shannon. Baruch Chung, in radiation oncology at [facility] on 10/06/21, who recommended an 8-week course of external beam radiotherapy concurrent with ST-ADT.  The patient has kindly been referred today for a second opinion and further discussion of potential radiation treatment options.  PREVIOUS RADIATION  THERAPY: Yes  2008: 51fxs of EBRT to the larynx for squamous cell CA of vocal cords- In [place]  PAST MEDICAL HISTORY:  Past Medical History:  Diagnosis Date   Coronary artery disease    a. 06/2015 Cardiac CT: Ca score 1103 (84th %'ile);  Chung. 07/2015 Cath: LM 70, LAD 80p, 100/101m, D1 70, D2 95, RI 75, RCA 100p/m;  c. 07/2015 CABG x 5 (LIMA->LAD, VG->Diag, VG->OM1->OM2, VG->OM3).   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 dilated, EF 35% improved to 45-50% (2015);  Chung. 07/2015 EF 25-35% by LV gram.   Lone atrial fibrillation (Shannon Chung) 1983   a. isolated episode, not on [place].   [place]ural thrombus of cardiac apex    a. 06/2014: LV; resolved with coumadin-->no residual on f/u echo, no longer on coumadin.   Osteoarthritis    a. 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   XRT; right vocal cord; had f/u until 2013 or 2015 [facility] [place] [facility]   Thrombocytopenia (Shannon Chung)    Vitamin D deficiency       PAST SURGICAL HISTORY: Past Surgical History:  Procedure Laterality Date   BICEPS TENDON REPAIR Right 1993   CARDIAC CATHETERIZATION N/A 07/05/2015   Procedure: Left Heart Cath and Coronary Angiography;  Surgeon: Shannon Merritts, Chung;  Location: [facility];  Service: Cardiovascular;  Laterality: N/A;   CATARACT EXTRACTION Left  12/2016   with keratoplasty   COLONOSCOPY  2007   COLONOSCOPY WITH PROPOFOL N/A 12/02/2017   TA, SSA, rpt 3 yrs(Shannon Chung, Shannon Chung, Chung)   COLONOSCOPY WITH PROPOFOL N/A 11/26/2020   Procedure: COLONOSCOPY WITH PROPOFOL;  Surgeon: Shannon Lame, Chung;  Location: [facility] ENDOSCOPY;  Service: Endoscopy;  Laterality: N/A;   CORONARY ARTERY BYPASS GRAFT N/A 07/29/2015   Procedure: CORONARY ARTERY BYPASS GRAFTING (CABG) x 5 (LIMA to LAD, SVG to DIAGONAL,  SVG SEQUENTIALLY to OM1 and OM2, SVG to OM3) with Endoscopic Vein Havesting of  GREATER SAPHENOUS VEIN from RIGHT THIGH and partial LOWER LEG ;  Surgeon: Shannon Chung;  Location: [place] OR;  Service: Open Heart Surgery;  Laterality: N/A;   EYE SURGERY     Chung/l cataract and cornea replaced    HAND SURGERY     left hand 1st/2nd trigger fingers Shannon. Sabra Chung ortho    JOINT REPLACEMENT     KNEE ARTHROSCOPY Left remote   MOHS SURGERY     left cheek scc 2022 Shannon. Winifred Chung   MOHS SURGERY     x 5 facial scc   right biceps tendon     repair/re attachment    SKIN CANCER EXCISION  10/2015   BCC - L ala (pending MOHs) and L scapula (complete excision)   TEE WITHOUT CARDIOVERSION N/A 07/29/2015   Procedure: TRANSESOPHAGEAL ECHOCARDIOGRAM (TEE);  Surgeon: Shannon Chung;  Location: [facility];  Service: Open Heart Surgery;  Laterality: N/A;   TONSILLECTOMY  1949   TOTAL KNEE ARTHROPLASTY Left 03/18/2016   cemented L TKR; Shannon Leys, Chung    FAMILY HISTORY:  Family History  Problem Relation Age of Onset   CAD Father 49       MI   Hypertension Father    Hyperlipidemia Father    Alcoholism Father    Diabetes Father    Cancer Daughter        dx'ed 46 retroperitoneal liposarcoma     SOCIAL HISTORY: He is retired but continues to Psychologist, occupational, on Mondays, 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 Social History   Socioeconomic History   Marital status: Significant Other    Spouse name: Not on file   Number of children: Not on file   Years of education: Not on file    Highest education level: Not on file  Occupational History   Not on file  Tobacco Use   Smoking status: Former    Packs/day: 0.50    Years: 10.00    Pack years: 5.00    Types: Cigarettes    Quit date: 11/02/1978    Years since quitting: 43.0   Smokeless tobacco: Never   Tobacco comments:    former smoker 1967-1980 1 pk/week no FH lung cancer   Vaping Use  Vaping Use: Never used  Substance and Sexual Activity   Alcohol use: Yes    Alcohol/week: 0.0 standard drinks    Comment: beer/wine on weekends   Drug use: No   Sexual activity: Yes  Other Topics Concern   Not on file  Social History Narrative   Lives with fiancee for 67yrs Metta Clines)   Moved from [place] years ago in 2015 to this area    Divorced   Retired Corporate treasurer    Occupation Retired Nurse, mental health   Edu: 1 yr college   Activity: volunteers at [place]: good water, fruits/vegetables daily      Tested at risk for OSA in preop for CABG   Social Determinants of Radio broadcast assistant Strain: Not on [employer] Insecurity: Not on file  Transportation Needs: Not on file  Physical Activity: Not on file  Stress: Not on file  Social Connections: Not on file  Intimate Partner Violence: Not on file    ALLERGIES: Bee pollen and Pollen extract  MEDICATIONS:  Current Outpatient Medications  Medication Sig Dispense Refill   acetaminophen (TYLENOL) 500 MG tablet Take 500 mg by mouth every 6 (six) hours as needed for mild pain.      aspirin 81 MG EC tablet Take 162 mg by mouth daily. Swallow whole.     atorvastatin (LIPITOR) 40 MG tablet Take 1 tablet (40 mg total) by mouth daily. 90 tablet 3   Cholecalciferol (VITAMIN D-3) 125 MCG (5000 UT) TABS Take by mouth daily.     clonazePAM (KLONOPIN) 0.5 MG tablet Take 0.5 tablets (0.25 mg total) by mouth daily as needed for anxiety. (Patient not taking: Reported on 10/07/2021) 15 tablet 2   ezetimibe (ZETIA) 10 MG tablet Take 1 tablet (10 mg total) by mouth daily. 90 tablet 3    famotidine (PEPCID) 20 MG tablet TAKE 1 TABLET BY MOUTH 2 TIMES DAILY AS NEEDED FOR HEARTBURN/INDIGESTION. D/C ZANTAC 180 tablet 3   fluticasone (FLONASE) 50 MCG/ACT nasal spray Place 1 spray into both nostrils as needed for allergies. Reported on 03/18/2016     lansoprazole (PREVACID) 30 MG capsule Take 1 capsule (30 mg total) by mouth every morning. 90 capsule 3   lisinopril (ZESTRIL) 10 MG tablet Take 1 tablet (10 mg total) by mouth daily. 90 tablet 3   metFORMIN (GLUCOPHAGE) 500 MG tablet Take 1 tablet (500 mg total) by mouth 2 (two) times daily with a meal. 180 tablet 3   metoprolol succinate (TOPROL-XL) 25 MG 24 hr tablet Take 1 tablet (25 mg total) by mouth daily. 90 tablet 2   prednisoLONE acetate (PRED FORTE) 1 % ophthalmic suspension Place 1 drop into both eyes once daily     sildenafil (REVATIO) 20 MG tablet Take 2-4 tablets (40-80 mg total) by mouth daily as needed (relations). 30 tablet 11   vitamin Chung-12 (CYANOCOBALAMIN) 500 MCG tablet Take 500 mcg by mouth daily. Reported on 03/17/2016     No current facility-administered medications for this encounter.    REVIEW OF SYSTEMS:  On review of systems, the patient reports that he is doing well overall. He denies any chest pain, shortness of breath, cough, fevers, chills, night sweats, unintended weight changes. He does note intentional weight loss with change in diet and exercise. He denies any bowel disturbances, and denies abdominal pain, nausea or vomiting. He denies any new musculoskeletal or joint aches or pains. His IPSS was 2, indicating minimal urinary symptoms. His SHIM was 19, indicating  he has mild erectile dysfunction. A complete review of systems is obtained and is otherwise negative.    PHYSICAL EXAM:  Wt Readings from Last 3 Encounters:  10/28/21 218 lb 3.2 oz (99 kg)  10/07/21 217 lb (98.4 kg)  10/07/21 216 lb 6 oz (98.1 kg)   Temp Readings from Last 3 Encounters:  10/28/21 97.6 F (36.4 C)  10/07/21 (!) 96.9 F (36.1  C) (Temporal)  10/06/21 (!) 96.7 F (35.9 C) (Tympanic)   BP Readings from Last 3 Encounters:  10/28/21 (!) 147/90  10/07/21 114/72  10/07/21 130/80   Pulse Readings from Last 3 Encounters:  10/28/21 70  10/07/21 69  10/07/21 70   Pain Assessment Pain Score: 0-No pain/10  In general this is a well appearing Caucasian male in no acute distress. He's alert and oriented x4 and appropriate throughout the examination. Cardiopulmonary assessment is negative for acute distress, and he exhibits normal effort.     KPS = 90  100 - Normal; no complaints; no evidence of disease. 90   - Able to carry on normal activity; minor signs or symptoms of disease. 80   - Normal activity with effort; some signs or symptoms of disease. 10   - Cares for self; unable to carry on normal activity or to do active work. 60   - Requires occasional assistance, but is able to care for most of his personal needs. 50   - Requires considerable assistance and frequent medical care. 38   - Disabled; requires special care and assistance. 26   - Severely disabled; hospital admission is indicated although death not imminent. 102   - Very sick; hospital admission necessary; active supportive treatment necessary. 10   - Moribund; fatal processes progressing rapidly. 0     - Dead  Karnofsky DA, Abelmann [place], Craver LS and [facility] [phone]) The use of the nitrogen mustards in the palliative treatment of carcinoma: with particular reference to bronchogenic carcinoma Cancer 1 634-56  LABORATORY DATA:  Lab Results  Component Value Date   WBC 6.7 04/01/2021   HGB 14.6 04/01/2021   HCT 43.2 04/01/2021   MCV 95.4 04/01/2021   PLT 155.0 04/01/2021   Lab Results  Component Value Date   NA 136 04/01/2021   K 4.0 04/01/2021   CL 100 04/01/2021   CO2 25 04/01/2021   Lab Results  Component Value Date   ALT 12 04/01/2021   AST 17 04/01/2021   ALKPHOS 56 04/01/2021   BILITOT 0.8 04/01/2021     RADIOGRAPHY: No  results found.    IMPRESSION/PLAN: 1. 80 y.o. gentleman with Stage T1c adenocarcinoma of the prostate with Gleason Score of 3+4, and PSA of 4.77. We discussed the patient's workup and outlined the nature of prostate cancer in this setting. The patient's T stage, Gleason's score, and PSA put him into the favorable intermediate risk group. Accordingly, he is eligible for a variety of potential treatment options including brachytherapy, 5.5 weeks of external radiation, or prostatectomy. We discussed the available radiation techniques, and focused on the details and logistics of delivery. Regarding surgical intervention, including brachytherapy, we did discuss the possibility of having a more difficult intubation given his history of radiation to the larynx.  We discussed and outlined the risks, benefits, short and long-term effects associated with radiotherapy and compared and contrasted these with prostatectomy. We discussed the role of SpaceOAR gel in reducing the rectal toxicity associated with radiotherapy. He appears to have a good understanding of his disease  and our treatment recommendations which are of curative intent.  He was encouraged to ask questions that were answered to his stated satisfaction.  At the conclusion of our conversation, the patient is interested in continuing in active surveillance for now.  He has a scheduled follow up with Shannon. Junious Chung in 12/2021, at which time he plans to reach a decision. Currently, he is leaning towards 5.5 weeks of EBRT here in [place]. His PCP had previously made a referral to radiation oncology at [facility], which he will likely keep for additional opinion, but he feels most inclined to proceed with treatment here locally when he is ready. We will share our discussion with Shannon. Junious Chung and look forward to following along, and likely participating, in his care.  He has our contact information and will let us know if/when he ultimately decides to proceed with  definitive radiotherapy.  We personally spent 90 minutes in this encounter including chart review, reviewing radiological studies, meeting face-to-face with the patient, entering orders and completing documentation.    Nicholos Johns, PA-C    Tyler Pita, Chung  [facility] Direct Dial: [phone]   Fax: [phone] [email or web address]   Skype   [email or web address]   This document serves as a record of services personally performed by Tyler Pita, Chung and Freeman Caldron, PA-C. It was created on their behalf by Wilburn Mylar, a trained medical scribe. The creation of this record is based on the scribe's personal observations and the provider's statements to them. This document has been checked and approved by the attending provider.

## 2021-10-31 ENCOUNTER — Ambulatory Visit (INDEPENDENT_AMBULATORY_CARE_PROVIDER_SITE_OTHER): Payer: Medicare Other

## 2021-10-31 ENCOUNTER — Telehealth: Payer: Self-pay

## 2021-10-31 VITALS — Ht 72.0 in | Wt 218.0 lb

## 2021-10-31 DIAGNOSIS — Z0000 Encounter for general adult medical examination without abnormal findings: Secondary | ICD-10-CM | POA: Diagnosis not present

## 2021-10-31 NOTE — Telephone Encounter (Signed)
Patient notes he is still awaiting call back and scheduling with [facility]. If any updates on scheduling please notify patient.

## 2021-10-31 NOTE — Patient Instructions (Addendum)
°  Shannon Chung , Thank you for taking time to come for your Medicare Wellness Visit. I appreciate your ongoing commitment to your health goals. Please review the following plan we discussed and let me know if I can assist you in the future.   These are the goals we discussed:  Goals      Follow up with Primary Care Provider     As needed        This is a list of the screening recommended for you and due dates:  Health Maintenance  Topic Date Due   Hemoglobin A1C  10/01/2021   Complete foot exam   10/04/2021   Eye exam for diabetics  01/28/2022   Colon Cancer Screening  11/27/2023   Tetanus Vaccine  05/01/2025   Pneumonia Vaccine  Completed   Flu Shot  Completed   COVID-19 Vaccine  Completed   Hepatitis C Screening: USPSTF Recommendation to screen - Ages 18-79 yo.  Completed   Zoster (Shingles) Vaccine  Completed   HPV Vaccine  Aged Out

## 2021-10-31 NOTE — Progress Notes (Signed)
Subjective:   Shannon Chung is a 80 y.o. male who presents for Medicare Annual/Subsequent preventive examination.  Review of Systems    No ROS.  Medicare Wellness Virtual Visit.  Visual/audio telehealth visit, UTA vital signs.   See social history for additional risk factors.   Cardiac Risk Factors include: advanced age (>31men, >66 women);male gender     Objective:    Today's Vitals   10/31/21 1119  Weight: 218 lb (98.9 kg)  Height: 6' (1.829 m)   Body mass index is 29.57 kg/m.  Advanced Directives 10/31/2021 10/28/2021 10/06/2021 11/26/2020 08/14/2019 12/02/2017 02/18/2017  Does Patient Have a Medical Advance Directive? Yes Yes Yes Yes Yes Yes Yes  Type of Paramedic of [place];Living will [place];Living will - [place];Living will [place];Living will [place];Living will [place];Living will  Does patient want to make changes to medical advance directive? No - Patient declined - No - Patient declined - No - Patient declined - -  Copy of [place] in Chart? No - copy requested - - - Yes - validated most recent copy scanned in chart (See row information) Yes No - copy requested    Current Medications (verified) Outpatient Encounter Medications as of 10/31/2021  Medication Sig   acetaminophen (TYLENOL) 500 MG tablet Take 500 mg by mouth every 6 (six) hours as needed for mild pain.    aspirin 81 MG EC tablet Take 162 mg by mouth daily. Swallow whole.   atorvastatin (LIPITOR) 40 MG tablet Take 1 tablet (40 mg total) by mouth daily.   Cholecalciferol (VITAMIN D-3) 125 MCG (5000 UT) TABS Take by mouth daily.   ezetimibe (ZETIA) 10 MG tablet Take 1 tablet (10 mg total) by mouth daily.   famotidine (PEPCID) 20 MG tablet TAKE 1 TABLET BY MOUTH 2 TIMES DAILY AS NEEDED FOR HEARTBURN/INDIGESTION. D/C ZANTAC   fluticasone (FLONASE) 50 MCG/ACT  nasal spray Place 1 spray into both nostrils as needed for allergies. Reported on 03/18/2016   lansoprazole (PREVACID) 30 MG capsule Take 1 capsule (30 mg total) by mouth every morning.   lisinopril (ZESTRIL) 10 MG tablet Take 1 tablet (10 mg total) by mouth daily.   metFORMIN (GLUCOPHAGE) 500 MG tablet Take 1 tablet (500 mg total) by mouth 2 (two) times daily with a meal.   metoprolol succinate (TOPROL-XL) 25 MG 24 hr tablet Take 1 tablet (25 mg total) by mouth daily.   prednisoLONE acetate (PRED FORTE) 1 % ophthalmic suspension Place 1 drop into both eyes once daily   sildenafil (REVATIO) 20 MG tablet Take 2-4 tablets (40-80 mg total) by mouth daily as needed (relations).   vitamin B-12 (CYANOCOBALAMIN) 500 MCG tablet Take 500 mcg by mouth daily. Reported on 03/17/2016   [DISCONTINUED] clonazePAM (KLONOPIN) 0.5 MG tablet Take 0.5 tablets (0.25 mg total) by mouth daily as needed for anxiety. (Patient not taking: Reported on 10/07/2021)   No facility-administered encounter medications on file as of 10/31/2021.    Allergies (verified) Bee pollen and Pollen extract   History: Past Medical History:  Diagnosis Date   Coronary artery disease    a. 06/2015 Cardiac CT: Ca score 1103 (84th %'ile);  b. 07/2015 Cath: LM 70, LAD 80p, 100/49m, D1 70, D2 95, RI 75, RCA 100p/m;  c. 07/2015 CABG x 5 (LIMA->LAD, VG->Diag, VG->OM1->OM2, VG->OM3).   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 dilated, EF 35% improved to 45-50% (2015);  b. 07/2015 EF 25-35% by LV gram.   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 isolated episode, not on [place].   [place] thrombus of cardiac apex    a. 06/2014: LV; resolved with coumadin-->no residual on f/u echo, no longer on coumadin.   Osteoarthritis    a. R-shoulder, L-knee Sabra Heck ortho)   Skin cancer    squamous and basal cell right forearm, SCC left cheek 10/04/20 sees derm regularly Dr. 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   XRT; right vocal cord; had f/u until 2013 or 2015 [facility] [place] [facility]   Thrombocytopenia ([place])    Vitamin D deficiency    Past Surgical History:  Procedure Laterality Date   BICEPS TENDON REPAIR Right [place] N/A 07/05/2015   Procedure: Left Heart Cath and Coronary Angiography;  Surgeon: Minna Merritts, MD;  Location: [facility];  Service: Cardiovascular;  Laterality: N/A;   CATARACT EXTRACTION Left 12/2016   with keratoplasty   COLONOSCOPY  2007   COLONOSCOPY WITH PROPOFOL N/A 12/02/2017   TA, SSA, rpt 3 yrs(Tahiliani, Varnita B, MD)   COLONOSCOPY WITH PROPOFOL N/A 11/26/2020   Procedure: COLONOSCOPY WITH PROPOFOL;  Surgeon: [name], MD;  Location: [facility] ENDOSCOPY;  Service: Endoscopy;  Laterality: N/A;   CORONARY ARTERY BYPASS GRAFT N/A 07/29/2015   Procedure: CORONARY ARTERY BYPASS GRAFTING (CABG) x 5 (LIMA to LAD, SVG to DIAGONAL,  SVG SEQUENTIALLY to OM1 and OM2, SVG to OM3) with Endoscopic Vein [place] of  GREATER SAPHENOUS VEIN from RIGHT THIGH and partial LOWER LEG ;  Surgeon: [name], MD;  Location: [facility];  Service: Open Heart Surgery;  Laterality: N/A;   EYE SURGERY     b/l cataract and cornea replaced    HAND SURGERY     left hand 1st/2nd trigger fingers Dr. Sabra Heck ortho    JOINT REPLACEMENT     KNEE ARTHROSCOPY Left remote   MOHS SURGERY     left cheek scc 2022 Dr. Winifred Olive   MOHS SURGERY     x 5 facial scc   right biceps tendon     repair/re  attachment    SKIN CANCER EXCISION  10/2015   BCC - L ala (pending MOHs) and L scapula (complete excision)   TEE WITHOUT CARDIOVERSION N/A 07/29/2015   Procedure: TRANSESOPHAGEAL ECHOCARDIOGRAM (TEE);  Surgeon: [name], MD;  Location: [facility];  Service: Open Heart Surgery;  Laterality: N/A;   TONSILLECTOMY  1949   TOTAL KNEE ARTHROPLASTY Left 03/18/2016   cemented L TKR; Earnestine Leys, MD   Family History  Problem Relation Age of Onset   CAD Father 70       MI   Hypertension Father    Hyperlipidemia Father    Alcoholism Father    Diabetes Father    Cancer  Daughter        dx'ed 72 retroperitoneal liposarcoma    Social History   Socioeconomic History   Marital status: Significant Other    Spouse name: Not on file   Number of children: Not on file   Years of education: Not on file   Highest education level: Not on file  Occupational History   Not on file  Tobacco Use   Smoking status: Former    Packs/day: 0.50    Years: 10.00    Pack years: 5.00    Types: Cigarettes    Quit date: 11/02/1978    Years since quitting: 43.0   Smokeless tobacco: Never   Tobacco comments:    former smoker 1967-1980 1 pk/week no FH lung cancer   Vaping Use   Vaping Use: Never used  Substance and Sexual Activity   Alcohol use: Yes    Alcohol/week: 0.0 standard drinks    Comment: beer/wine on weekends   Drug use: No   Sexual activity: Yes  Other Topics Concern   Not on file  Social History Narrative   Lives with fiancee for 49yrs Metta Clines)   Moved from [place] years ago in 2015 to this area    Divorced   Retired Corporate treasurer    Occupation Retired Nurse, mental health   Edu: 1 yr college   Activity: volunteers at [place]: good water, fruits/vegetables daily      Tested at risk for OSA in preop for CABG   Social Determinants of Radio broadcast assistant Strain: Low Risk    Difficulty of Paying Living Expenses: Not hard at [employer] Insecurity: No Food Insecurity   Worried About Paediatric nurse in the Last Year: Never true   Arboriculturist in the Last Year: Never true  Transportation Needs: No Transportation Needs   Lack of Transportation (Medical): No   Lack of Transportation (Non-Medical): No  Physical Activity: Not on file  Stress: No Stress Concern Present   Feeling of Stress : Not at all  Social Connections: Unknown   Frequency of Communication with Friends and Family: More than three times a week   Frequency of Social Gatherings with Friends and Family: Once a week   Attends Religious Services: Not on Electrical engineer or Organizations: Not on file   Attends Archivist Meetings: Not on file   Marital Status: Living with partner    Tobacco Counseling Counseling given: Not Answered Tobacco comments: former smoker [phone] 1 pk/week no FH lung cancer    Clinical Intake:  Pre-visit preparation completed: Yes        Diabetes: Yes (Followed by pcp)  How often do you need to have someone help you when you read instructions, pamphlets, or other written materials from your doctor or pharmacy?: 1 - Never   Nutrition Risk Assessment: Does the patient have any non-healing wounds?  No   Financial Strains and Diabetes Management:  Are you having any financial strains with the device, your supplies or your medication? No .  Does the patient want to be seen by Chronic Care Management for management of their diabetes?  No  Would the patient like to be referred to a Nutritionist or for Diabetic Management?  No   Interpreter Needed?: No      Activities of Daily Living In your present state of health, do you have any difficulty performing the following activities: 10/31/2021  Hearing? N  Vision? N  Difficulty concentrating or making decisions? N  Walking or climbing stairs? N  Dressing or bathing? N  Doing errands, shopping? N  Preparing Food and eating ? N  Using the Toilet? N  In the past six months, have you accidently  leaked urine? N  Do you have problems with loss of bowel control? N  Managing your Medications? N  Managing your Finances? N  Housekeeping or managing your Housekeeping? N  Some recent data might be hidden    Patient Care Team: [name], [name], MD as PCP - General (Internal Medicine) Minna Merritts, MD as PCP - Cardiology (Cardiology) Minna Merritts, MD as Consulting Physician (Cardiology) Katheren Puller, RN as Oncology Nurse Navigator  Indicate any recent Medical Services you may have received from other than [facility] providers in the past year (date may be approximate).     Assessment:   This is a routine wellness examination for Shannon Chung.  Virtual Visit via Telephone Note  I connected with  Shannon Chung on 10/31/21 at 11:15 AM EST by telephone and verified that I am speaking with the correct person using two identifiers.  Persons participating in the virtual visit: patient/Nurse Health Advisor   I discussed the limitations, risks, security and privacy concerns of performing an evaluation and management service by telephone and the availability of in person appointments. The patient expressed understanding and agreed to proceed.  Interactive audio and video telecommunications were attempted between this nurse and patient, however failed, due to patient having technical difficulties OR patient did not have access to video capability.  We continued and completed visit with audio only.  Some vital signs may be absent or patient reported.   Hearing/Vision screen Hearing Screening - Comments:: Patient is able to hear conversational tones without difficulty.  No issues reported. Vision Screening - Comments:: Wears corrective lenses Cataract extraction, bilateral They have seen their ophthalmologist in the last 12 months.   Dietary issues and exercise activities discussed: Current Exercise Habits: Home exercise routine, Type of exercise: walking, Intensity: Mild Low carb  diet Good water intake   Goals Addressed             This Visit's Progress    Follow up with Primary Care Provider       As needed       Depression Screen [facility] 2/9 Scores 10/31/2021 10/07/2021 08/14/2020 04/04/2020 11/07/2019 08/14/2019 08/05/2018  PHQ - 2 Score 0 0 0 0 0 0 0  PHQ- 9 Score - - - - - - -    Fall Risk Fall Risk  10/31/2021 04/04/2021 10/04/2020 08/14/2020 04/04/2020  Falls in the past year? 0 0 0 0 0  Number falls in past yr: 0 0 0 0 0  Injury with Fall? - 0 0 0 0  Follow up Falls evaluation completed Falls evaluation completed Falls evaluation completed Falls evaluation completed Falls evaluation completed    [place]: Home free of loose throw rugs in walkways, pet beds, electrical cords, etc? Yes  Adequate lighting in your home to reduce risk of falls? Yes   ASSISTIVE DEVICES UTILIZED TO PREVENT FALLS: Life alert? No  Use of a cane, walker or w/c? No   TIMED UP AND GO: Was the test performed? No .   Cognitive Function: Patient is aware and oriented x3.  MMSE - Mini Mental State Exam 02/18/2017  Orientation to time 5  Orientation to Place 5  Registration 3  Attention/ Calculation  0  Recall 3  Language- name 2 objects 0  Language- repeat 1  Language- follow 3 step command 3  Language- read & follow direction 0  Write a sentence 0  Copy design 0  Total score 20     6CIT Screen 08/14/2019  What Year? 0 points  What month? 0 points  What time? 0 points  Count back from 20 0 points  Months in reverse 0 points  Repeat phrase 0 points  Total Score 0    Immunizations Immunization History  Administered Date(s) Administered   Fluad Quad(high Dose 65+) 07/03/2019, 07/27/2020   Influenza, High Dose Seasonal PF 08/24/2017, 07/28/2018, 07/15/2021   Influenza, Seasonal, Injecte, Preservative Fre 07/20/2016   Influenza,inj,Quad PF,6+ Mos 08/12/2015   Moderna Covid-19 Vaccine Bivalent Booster 62yrs & up 08/05/2021    Moderna SARS-COV2 Booster Vaccination 03/07/2021   Moderna Sars-Covid-2 Vaccination 11/14/2019, 12/12/2019   PFIZER(Purple Top)SARS-COV-2 Vaccination 08/23/2020   PNEUMOCOCCAL CONJUGATE-20 06/05/2021   Pneumococcal Conjugate-13 01/08/2014   Pneumococcal Polysaccharide-23 08/02/2012, 08/01/2015   Tdap 05/02/2015   Zoster Recombinat (Shingrix) 04/15/2018, 05/05/2019, 07/27/2019   Zoster, Live 11/02/2005   Screening Tests Health Maintenance  Topic Date Due   HEMOGLOBIN A1C  10/01/2021   FOOT EXAM  10/04/2021   OPHTHALMOLOGY EXAM  01/28/2022   COLONOSCOPY (Pts 45-53yrs Insurance coverage will need to be confirmed)  11/27/2023   TETANUS/TDAP  05/01/2025   Pneumonia Vaccine 52+ Years old  Completed   INFLUENZA VACCINE  Completed   COVID-19 Vaccine  Completed   Hepatitis C Screening  Completed   Zoster Vaccines- Shingrix  Completed   HPV VACCINES  Aged Out   Health Maintenance Health Maintenance Due  Topic Date Due   HEMOGLOBIN A1C  10/01/2021   FOOT EXAM  10/04/2021   Lung Cancer Screening: (Low Dose CT Chest recommended if Age 33-80 years, 30 pack-year currently smoking OR have quit w/in 15years.) does not qualify.   Vision Screening: Recommended annual ophthalmology exams for early detection of glaucoma and other disorders of the eye.  Dental Screening: Recommended annual dental exams for proper oral hygiene  Community Resource Referral / Chronic Care Management: CRR required this visit?  No   CCM required this visit?  No      Plan:   Keep all routine maintenance appointments.   I have personally reviewed and noted the following in the patients chart:   Medical and social history Use of alcohol, tobacco or illicit drugs  Current medications and supplements including opioid prescriptions. Patient is not currently taking opioid prescriptions. Functional ability and status Nutritional status Physical activity Advanced directives List of other  physicians Hospitalizations, surgeries, and ER visits in previous 12 months Vitals Screenings to include cognitive, depression, and falls Referrals and appointments  In addition, I have reviewed and discussed with patient certain preventive protocols, quality metrics, and best practice recommendations. A written personalized care plan for preventive services as well as general preventive health recommendations were provided to patient.     Varney Biles, LPN   [id number]

## 2021-11-13 ENCOUNTER — Other Ambulatory Visit: Payer: Self-pay | Admitting: Cardiovascular Disease

## 2021-12-04 ENCOUNTER — Other Ambulatory Visit (INDEPENDENT_AMBULATORY_CARE_PROVIDER_SITE_OTHER): Payer: Medicare Other

## 2021-12-04 ENCOUNTER — Other Ambulatory Visit: Payer: Self-pay

## 2021-12-04 DIAGNOSIS — E1159 Type 2 diabetes mellitus with other circulatory complications: Secondary | ICD-10-CM

## 2021-12-04 DIAGNOSIS — R5383 Other fatigue: Secondary | ICD-10-CM

## 2021-12-04 DIAGNOSIS — I152 Hypertension secondary to endocrine disorders: Secondary | ICD-10-CM | POA: Diagnosis not present

## 2021-12-04 DIAGNOSIS — E559 Vitamin D deficiency, unspecified: Secondary | ICD-10-CM | POA: Diagnosis not present

## 2021-12-04 LAB — COMPREHENSIVE METABOLIC PANEL
ALT: 14 U/L (ref 0–53)
AST: 18 U/L (ref 0–37)
Albumin: 4.5 g/dL (ref 3.5–5.2)
Alkaline Phosphatase: 50 U/L (ref 39–117)
BUN: 19 mg/dL (ref 6–23)
CO2: 30 mEq/L (ref 19–32)
Calcium: 9.6 mg/dL (ref 8.4–10.5)
Chloride: 101 mEq/L (ref 96–112)
Creatinine, Ser: 0.9 mg/dL (ref 0.40–1.50)
GFR: 81.91 mL/min (ref >60.00)
Glucose, Bld: 105 mg/dL — ABNORMAL HIGH (ref 70–99)
Potassium: 4.5 mEq/L (ref 3.5–5.1)
Sodium: 138 mEq/L (ref 135–145)
Total Bilirubin: 0.8 mg/dL (ref 0.2–1.2)
Total Protein: 7.1 g/dL (ref 6.0–8.3)

## 2021-12-04 LAB — CBC WITH DIFFERENTIAL/PLATELET
Basophils Absolute: 0 10*3/uL (ref 0.0–0.1)
Basophils Relative: 0.4 % (ref 0.0–3.0)
Eosinophils Absolute: 0.1 10*3/uL (ref 0.0–0.7)
Eosinophils Relative: 1.7 % (ref 0.0–5.0)
HCT: 42.2 % (ref 39.0–52.0)
Hemoglobin: 14 g/dL (ref 13.0–17.0)
Lymphocytes Relative: 39.2 % (ref 12.0–46.0)
Lymphs Abs: 2.7 10*3/uL (ref 0.7–4.0)
MCHC: 33.1 g/dL (ref 30.0–36.0)
MCV: 97.5 fl (ref 78.0–100.0)
Monocytes Absolute: 0.5 10*3/uL (ref 0.1–1.0)
Monocytes Relative: 7.3 % (ref 3.0–12.0)
Neutro Abs: 3.6 10*3/uL (ref 1.4–7.7)
Neutrophils Relative %: 51.4 % (ref 43.0–77.0)
Platelets: 156 10*3/uL (ref 150.0–400.0)
RBC: 4.33 Mil/uL (ref 4.22–5.81)
RDW: 14 % (ref 11.5–15.5)
WBC: 7 10*3/uL (ref 4.0–10.5)

## 2021-12-04 LAB — LIPID PANEL
Cholesterol: 106 mg/dL (ref 0–200)
HDL: 46 mg/dL (ref >39.00)
LDL Cholesterol: 37 mg/dL (ref 0–99)
NonHDL: 60.26
Total CHOL/HDL Ratio: 2
Triglycerides: 114 mg/dL (ref 0.0–149.0)
VLDL: 22.8 mg/dL (ref 0.0–40.0)

## 2021-12-04 LAB — VITAMIN D 25 HYDROXY (VIT D DEFICIENCY, FRACTURES): VITD: 46.27 ng/mL (ref 30.00–100.00)

## 2021-12-04 LAB — MICROALBUMIN / CREATININE URINE RATIO
Creatinine,U: 114 mg/dL
Microalb Creat Ratio: 1 mg/g (ref 0.0–30.0)
Microalb, Ur: 1.1 mg/dL (ref 0.0–1.9)

## 2021-12-04 LAB — TSH: TSH: 1.54 u[IU]/mL (ref 0.35–5.50)

## 2021-12-04 LAB — HEMOGLOBIN A1C: Hgb A1c MFr Bld: 6.6 % — ABNORMAL HIGH (ref 4.6–6.5)

## 2021-12-05 LAB — URINALYSIS, ROUTINE W REFLEX MICROSCOPIC
Bilirubin Urine: NEGATIVE
Glucose, UA: NEGATIVE
Hgb urine dipstick: NEGATIVE
Ketones, ur: NEGATIVE
Leukocytes,Ua: NEGATIVE
Nitrite: NEGATIVE
Protein, ur: NEGATIVE
Specific Gravity, Urine: 1.019 (ref 1.001–1.035)
pH: 5.5 (ref 5.0–8.0)

## 2021-12-05 NOTE — Progress Notes (Signed)
Patient followed up with Dr. Junious Silk @ [facility] on 2/1 and has decided to continue on active surveillance for his prostate cancer. He will have a repeat PSA and follow up with MD on 03/12/2022.

## 2021-12-30 ENCOUNTER — Other Ambulatory Visit: Payer: Self-pay | Admitting: Internal Medicine

## 2021-12-30 DIAGNOSIS — N529 Male erectile dysfunction, unspecified: Secondary | ICD-10-CM

## 2022-01-18 DIAGNOSIS — U071 COVID-19: Secondary | ICD-10-CM | POA: Insufficient documentation

## 2022-01-18 DIAGNOSIS — Z23 Encounter for immunization: Secondary | ICD-10-CM | POA: Insufficient documentation

## 2022-01-18 HISTORY — DX: COVID-19: U07.1

## 2022-01-18 HISTORY — DX: Encounter for immunization: Z23

## 2022-01-28 LAB — HM DIABETES EYE EXAM

## 2022-03-03 ENCOUNTER — Ambulatory Visit
Admission: EM | Admit: 2022-03-03 | Discharge: 2022-03-03 | Disposition: A | Discharge status: Home / Self Care | Payer: Medicare Other

## 2022-03-03 ENCOUNTER — Encounter: Payer: Self-pay | Admitting: Emergency Medicine

## 2022-03-03 DIAGNOSIS — M542 Cervicalgia: Secondary | ICD-10-CM

## 2022-03-03 DIAGNOSIS — H6981 Other specified disorders of Eustachian tube, right ear: Secondary | ICD-10-CM

## 2022-03-03 NOTE — Discharge Instructions (Addendum)
Follow-up with your primary care physician tomorrow as scheduled by appointment. ?

## 2022-03-03 NOTE — ED Triage Notes (Signed)
Pt here with a sharp, tingling pain that shoots up right side of neck to behind ear intermittently for several weeks. Was sent by PCP to have it looked at.  ?

## 2022-03-03 NOTE — ED Provider Notes (Signed)
?[facility] ? ? ? ?CSN: [id number] ?Arrival date & time: 03/03/22  0854 ? ? ?  ? ?History   ?Chief Complaint ?Chief Complaint  ?Patient presents with  ? Neck Pain  ? ? ?HPI ?Shannon Chung is a 81 y.o. male.  ? ?81 year old male who presents with right neck pain.  Patient indicates that the pain has been intermittent over the past month.  He describes the pain as being sharp lasting for 3 to 4 seconds he does have some tingling sensation associated with it but then the pain resolves.  Patient indicates that he has had some mild sinus congestion with mild ear pressure bilaterally.  Patient indicates he has not had any unusual rash on the right side of the neck he has not had any fever or chills no vision changes no neck pain no unusual numbness or tingling of the upper extremities or weakness of the upper extremities bilaterally.  Patient has had the Shingles and Covid vaccines. ? ?[facility].  He relates that he was walking outside he tripped fell hit his head on the sidewalk and lost consciousness for a few seconds.  Patient indicates that he was taken by EMS to [facility] where he was evaluated by the emergency room physician.  He was admitted and he stayed in the hospital for 1-1/2 days.  Patient indicates that he had a head study of some type and possibly an EKG.  He said that that these were normal.  He said that they also did some other tests and they were also normal.  He does not recall what other scans/tests were performed at the hospital.  He does bring some records with him although these are more discharge instructions. ? ?Patient will be instructed to sign a release of medical information at his Primary Care Office so that we can try and get the medical records from [facility]. ? ?Patient indicates he does have an appointment at [facility] tomorrow May 3 at 1:00 PM for evaluation of this condition.  Patient relates he does have a  primary care provider - [name] that he sees it on regular basis. ? ? ?Neck Pain ?Associated symptoms: no headaches   ? ?Past Medical History:  ?Diagnosis Date  ? Coronary artery disease   ? a. 06/2015 Cardiac CT: Ca score 1103 (84th %'ile);  b. 07/2015 Cath: LM 70, LAD 80p, 100/23m D1 70, D2 95, RI 75, RCA 100p/m;  c. 07/2015 CABG x 5 (LIMA->LAD, VG->Diag, VG->OM1->OM2, VG->OM3).  ? Diabetes mellitus without complication ([place] 95/7846 ? Dyslipidemia   ? Essential hypertension   ? 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 L ala, pending MOHs ([place])  ? Fuchs' corneal dystrophy 2016  ? sees Dr KMaudie Mercury ? Fuchs' corneal dystrophy   ? GERD (gastroesophageal reflux disease)   ? Heart attack ([facility]   ? silent  ? Heart disease   ? history of blood clot in left ventricle per pt   ? History of radiation exposure   ? right vocal cord squamous cell cancer  ? Impingement syndrome of right shoulder 10/2015  ? s/p steroid injection Dr MSabra Heck ? Ischemic cardiomyopathy   ? a. dilated, EF 35% improved to 45-50% (2015);  b. 07/2015 EF 25-35% by LV gram.  ? 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 a. isolated episode, not on [place]  ? Mural thrombus of cardiac apex   ? a. 06/2014: LV; resolved with coumadin-->no residual  on f/u echo, no longer on coumadin.  ? Osteoarthritis   ? a. R-shoulder, L-knee Sabra Heck ortho)  ? Skin cancer   ? squamous and basal cell right forearm, SCC left cheek 10/04/20 sees derm regularly Dr. Kellie Moor   ? 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 XRT; right vocal cord; had f/u until 2013 or 2015 [facility]  ? Thrombocytopenia ([place])   ? Vitamin D deficiency   ? ? ?Patient Active Problem List  ? Diagnosis Date Noted  ? Lumbar spondylosis 10/07/2021  ? Malignant neoplasm of prostate ([place]) 10/07/2021  ? DDD (degenerative disc disease), lumbar 10/07/2021  ? Kidney stones 04/17/2021  ? Aortic atherosclerosis ([place]) 04/17/2021  ? Diverticulosis 04/17/2021  ? Personal history of colonic polyps   ? Polyp  of colon   ? Grief 10/07/2020  ? 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 Hypertension associated with diabetes ([place]) 10/07/2020  ? Overweight (BMI 25.0-29.9) 10/07/2020  ? 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  ? Vitamin D deficiency 08/01/2020  ? Polyp of sigmoid colon 08/01/2020  ? Anxiety 06/07/2020  ? Insomnia 06/07/2020  ? Gastroesophageal reflux disease 04/04/2020  ? Degenerative joint disease of hand 03/01/2020  ? Strain of muscle of right hip 08/28/2019  ? Trigger finger of left hand 07/07/2019  ? BPH (benign prostatic hyperplasia) 08/05/2018  ? 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 Macular scar of both eyes 04/20/2018  ? 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 Reduced visual acuity 04/20/2018  ? 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/25/2018  ? Fatty liver 03/25/2018  ? PSA elevation 03/25/2018  ? Thrombocytopenia ([place]) 02/14/2018  ? Frequent PVCs 02/14/2018  ? Positive colorectal cancer screening using Cologuard test   ? Benign neoplasm of cecum   ? Benign neoplasm of transverse colon   ? Health maintenance examination 02/23/2017  ? Erectile dysfunction 02/23/2017  ? Obesity (BMI 30.0-34.9) 01/25/2017  ? Hx of CABG 01/24/2017  ? Medicare annual wellness visit, subsequent 10/21/2015  ? Advanced care planning/counseling discussion 10/21/2015  ? Biceps tendinitis 10/10/2015  ? Coronary artery disease of native artery of native heart with stable angina pectoris ([place])   ? DM2 (diabetes mellitus, type 2) ([place]) 08/12/2015  ? History of radiation exposure   ? Impingement syndrome of shoulder region 05/08/2015  ? 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 Ischemic cardiomyopathy 01/02/2015  ? Hyperlipidemia 01/02/2015  ? Essential hypertension 01/02/2015  ? Osteoarthritis 01/02/2015  ? 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/02/2014  ? 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 Squamous cell carcinoma of vocal cord [facility]) 2008  ? ? ?Past  Surgical History:  ?Procedure Laterality Date  ? BICEPS TENDON REPAIR Right 1993  ? CARDIAC CATHETERIZATION N/A 07/05/2015  ? Procedure: Left Heart Cath and Coronary Angiography;  Surgeon: Minna Merritts, MD;  Location: [place] [facility];  Service: Cardiovascular;  Laterality: N/A;  ? CATARACT EXTRACTION Left 12/2016  ? with keratoplasty  ? COLONOSCOPY  2007  ? COLONOSCOPY WITH PROPOFOL N/A 12/02/2017  ? TA, SSA, rpt 3 yrs(Tahiliani, Varnita B, MD)  ? COLONOSCOPY WITH PROPOFOL N/A 11/26/2020  ? Procedure: COLONOSCOPY WITH PROPOFOL;  Surgeon: [name], MD;  Location: [facility] ENDOSCOPY;  Service: Endoscopy;  Laterality: N/A;  ? CORONARY ARTERY BYPASS GRAFT N/A 07/29/2015  ? Procedure: CORONARY ARTERY BYPASS GRAFTING (CABG) x 5 (LIMA to LAD, SVG to DIAGONAL,  SVG SEQUENTIALLY to OM1 and OM2, SVG to OM3) with Endoscopic Vein  Havesting of  GREATER SAPHENOUS VEIN from RIGHT THIGH and partial LOWER LEG ;  Surgeon: [name], MD;  Location: [place] OR;  Service: Open Heart Surgery;  Laterality: N/A;  ? EYE SURGERY    ? b/l cataract and cornea replaced   ? HAND SURGERY    ? left hand 1st/2nd trigger fingers Dr. Sabra Heck ortho   ? JOINT REPLACEMENT    ? KNEE ARTHROSCOPY Left remote  ? MOHS SURGERY    ? left cheek scc 2022 Dr. Winifred Olive  ? MOHS SURGERY    ? x 5 facial scc  ? right biceps tendon    ? repair/re attachment   ? SKIN CANCER EXCISION  10/2015  ? BCC - L ala (pending MOHs) and L scapula (complete excision)  ? TEE WITHOUT CARDIOVERSION N/A 07/29/2015  ? Procedure: TRANSESOPHAGEAL ECHOCARDIOGRAM (TEE);  Surgeon: [name], MD;  Location: [facility];  Service: Open Heart Surgery;  Laterality: N/A;  ? TONSILLECTOMY  1949  ? TOTAL KNEE ARTHROPLASTY Left 03/18/2016  ? cemented L TKR; Earnestine Leys, MD  ? ? ? ? ? ?Home Medications   ? ?Prior to Admission medications   ?Medication Sig Start Date End Date Taking? Authorizing Provider  ?acetaminophen (TYLENOL) 500 MG tablet Take 500 mg by mouth every 6 (six) hours as needed for mild  pain.     [provider]  ?aspirin 81 MG EC tablet Take 162 mg by mouth daily. Swallow whole.    [provider]  ?atorvastatin (LIPITOR) 40 MG tablet TAKE 1 TABLET BY MOUTH  DAILY 1/12/

## 2022-03-04 ENCOUNTER — Ambulatory Visit (INDEPENDENT_AMBULATORY_CARE_PROVIDER_SITE_OTHER): Payer: Medicare Other

## 2022-03-04 ENCOUNTER — Ambulatory Visit: Payer: Medicare Other | Admitting: Internal Medicine

## 2022-03-04 ENCOUNTER — Encounter: Payer: Self-pay | Admitting: Internal Medicine

## 2022-03-04 VITALS — BP 120/82 | HR 67 | Temp 98.0°F | Resp 14 | Ht 72.0 in | Wt 204.1 lb

## 2022-03-04 DIAGNOSIS — E119 Type 2 diabetes mellitus without complications: Secondary | ICD-10-CM

## 2022-03-04 DIAGNOSIS — I639 Cerebral infarction, unspecified: Secondary | ICD-10-CM

## 2022-03-04 DIAGNOSIS — C61 Malignant neoplasm of prostate: Secondary | ICD-10-CM | POA: Insufficient documentation

## 2022-03-04 DIAGNOSIS — M5412 Radiculopathy, cervical region: Secondary | ICD-10-CM | POA: Diagnosis not present

## 2022-03-04 DIAGNOSIS — M47812 Spondylosis without myelopathy or radiculopathy, cervical region: Secondary | ICD-10-CM

## 2022-03-04 DIAGNOSIS — E010 Iodine-deficiency related diffuse (endemic) goiter: Secondary | ICD-10-CM

## 2022-03-04 DIAGNOSIS — J341 Cyst and mucocele of nose and nasal sinus: Secondary | ICD-10-CM

## 2022-03-04 DIAGNOSIS — I6523 Occlusion and stenosis of bilateral carotid arteries: Secondary | ICD-10-CM

## 2022-03-04 DIAGNOSIS — R202 Paresthesia of skin: Secondary | ICD-10-CM

## 2022-03-04 DIAGNOSIS — I10 Essential (primary) hypertension: Secondary | ICD-10-CM

## 2022-03-04 HISTORY — DX: Malignant neoplasm of prostate: C61

## 2022-03-04 LAB — HM DIABETES EYE EXAM

## 2022-03-04 MED ORDER — METFORMIN HCL 500 MG PO TABS: 500.0000 mg | ORAL_TABLET | Freq: Two times a day (BID) | ORAL | 3 refills | Status: DC

## 2022-03-04 MED ORDER — LISINOPRIL 10 MG PO TABS: 10.0000 mg | ORAL_TABLET | Freq: Every day | ORAL | 3 refills | Status: DC

## 2022-03-04 NOTE — Patient Instructions (Addendum)
[facility] outpatient [facility] center  ?Address: [address], [place], [place] [address] ?Hours:  ?Open now ? ?  ?Add full hours ? ? ?Phone: [phone] ? ? ? ?

## 2022-03-04 NOTE — Progress Notes (Addendum)
Chief Complaint  ?Patient presents with  ? Acute Visit  ?  Disc about recent visit to [facility] 03/03/22 was seen for tingling sensation in R side of neck which radiates to bottom of neck. Ongoing x1 month episodes happen weekly concerned about it being his carotid artery. Carotid was checked by PA-C yesterday and was normal. He did have a fall 1 month ago in which he hit the crown of his head and he states ever since it started happening.   ? ?F/u  ?12/2021 +covid with syncope at [facility] will get records today  ?Phone [phone] Fax [phone]  ? ?[facility] f/u [facility] right neck lateral anterior radiates to bottom of the neck c/o tingling in this area and discomfort 2/10 he is c/w carotid disease though no bruit heard at urgent care he did have injury 12/2021 with syncopal episode and hit the top of his head and seen above. Tingling right lateral neck has been going on 1x per week and with raising arms over head hanging items in his closet this reproduced the tingling. He has used pillows to prop his neck up not sure if this could be the cause  ?3 weeks ago he reports left molar was extracted  ? ? ? ?Review of Systems  ?Constitutional:  Negative for weight loss.  ?HENT:  Negative for hearing loss.   ?Eyes:  Negative for blurred vision.  ?Respiratory:  Negative for shortness of breath.   ?Cardiovascular:  Negative for chest pain.  ?Gastrointestinal:  Negative for abdominal pain and blood in stool.  ?Musculoskeletal:  Negative for back pain.  ?Skin:  Negative for rash.  ?Neurological:  Positive for sensory change. Negative for headaches.  ?Psychiatric/Behavioral:  Negative for depression.   ?Past Medical History:  ?Diagnosis Date  ? Coronary artery disease   ? a. 06/2015 Cardiac CT: Ca score 1103 (84th %'ile);  b. 07/2015 Cath: LM 70, LAD 80p, 100/23m D1 70, D2 95, RI 75, RCA 100p/m;  c. 07/2015 CABG x 5 (LIMA->LAD, VG->Diag, VG->OM1->OM2, VG->OM3).  ? COVID-19   ? 12/2021  ? Diabetes mellitus without  complication ([place] 024/2683 ? Dyslipidemia   ? Essential hypertension   ? 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 L ala, pending MOHs ([place])  ? Fuchs' corneal dystrophy 2016  ? sees Dr KMaudie Mercury ? Fuchs' corneal dystrophy   ? GERD (gastroesophageal reflux disease)   ? Heart attack ([place]   ? silent  ? Heart disease   ? history of blood clot in left ventricle per pt   ? History of radiation exposure   ? right vocal cord squamous cell cancer  ? Impingement syndrome of right shoulder 10/2015  ? s/p steroid injection Dr MSabra Heck ? Ischemic cardiomyopathy   ? a. dilated, EF 35% improved to 45-50% (2015);  b. 07/2015 EF 25-35% by LV gram.  ? 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 a. isolated episode, not on [place]  ? Mural thrombus of cardiac apex   ? a. 06/2014: LV; resolved with coumadin-->no residual on f/u echo, no longer on coumadin.  ? Osteoarthritis   ? a. R-shoulder, L-knee (Sabra Heckortho)  ? Skin cancer   ? squamous and basal cell right forearm, SCC left cheek 10/04/20 sees derm regularly Dr. IKellie Moor  ? 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 XRT; right vocal cord; [place]  ? Thrombocytopenia ([place]   ? Vitamin D deficiency   ? ?  Past Surgical History:  ?Procedure Laterality Date  ? BICEPS TENDON REPAIR Right 1993  ? CARDIAC CATHETERIZATION N/A 07/05/2015  ? Procedure: Left Heart Cath and Coronary Angiography;  Surgeon: Minna Merritts, MD;  Location: [place] [facility];  Service: Cardiovascular;  Laterality: N/A;  ? CATARACT EXTRACTION Left 12/2016  ? with keratoplasty  ? COLONOSCOPY  2007  ? COLONOSCOPY WITH PROPOFOL N/A 12/02/2017  ? TA, SSA, rpt 3 yrs(Tahiliani, Varnita B, MD)  ? COLONOSCOPY WITH PROPOFOL N/A 11/26/2020  ? Procedure: COLONOSCOPY WITH PROPOFOL;  Surgeon: [name], MD;  [facility] ENDOSCOPY;  Service: Endoscopy;  Laterality: N/A;  ? CORONARY ARTERY BYPASS GRAFT N/A 07/29/2015  ? Procedure: CORONARY ARTERY BYPASS GRAFTING (CABG) x 5 (LIMA to LAD, SVG to  DIAGONAL,  SVG SEQUENTIALLY to OM1 and OM2, SVG to OM3) with Endoscopic Vein Havesting of  GREATER SAPHENOUS VEIN from RIGHT THIGH and partial LOWER LEG ;  Surgeon: [name], MD;  Location: [place] [facility];  Service: Open Heart Surgery;  Laterality: N/A;  ? EYE SURGERY    ? b/l cataract and cornea replaced   ? HAND SURGERY    ? left hand 1st/2nd trigger fingers Dr. Sabra Heck ortho   ? JOINT REPLACEMENT    ? KNEE ARTHROSCOPY Left remote  ? MOHS SURGERY    ? left cheek scc 2022 Dr. Winifred Olive  ? MOHS SURGERY    ? x 5 facial scc  ? right biceps tendon    ? repair/re attachment   ? SKIN CANCER EXCISION  10/2015  ? BCC - L ala (pending MOHs) and L scapula (complete excision)  ? TEE WITHOUT CARDIOVERSION N/A 07/29/2015  ? Procedure: TRANSESOPHAGEAL ECHOCARDIOGRAM (TEE);  Surgeon: [name], MD;  Location: [place];  Service: Open Heart Surgery;  Laterality: N/A;  ? TONSILLECTOMY  1949  ? TOTAL KNEE ARTHROPLASTY Left 03/18/2016  ? cemented L TKR; Earnestine Leys, MD  ? ?Family History  ?Problem Relation Age of Onset  ? CAD Father 42  ?     MI  ? Hypertension Father   ? Hyperlipidemia Father   ? Alcoholism Father   ? Diabetes Father   ? Cancer Daughter   ?     dx'ed 48 retroperitoneal liposarcoma   ? ?Social History  ? ?Socioeconomic History  ? Marital status: Significant Other  ?  Spouse name: Not on file  ? Number of children: Not on file  ? Years of education: Not on file  ? Highest education level: Not on file  ?Occupational History  ? Not on file  ?Tobacco Use  ? Smoking status: Former  ?  Packs/day: 0.50  ?  Years: 10.00  ?  Pack years: 5.00  ?  Types: Cigarettes  ?  Quit date: 11/02/1978  ?  Years since quitting: 43.3  ? Smokeless tobacco: Never  ? Tobacco comments:  ?  former smoker 1967-1980 1 pk/week no FH lung cancer   ?Vaping Use  ? Vaping Use: Never used  ?Substance and Sexual Activity  ? Alcohol use: Yes  ?  Alcohol/week: 0.0 standard drinks  ?  Comment: beer/wine on weekends  ? Drug use: No  ? Sexual activity: Yes  ?Other  Topics Concern  ? Not on file  ?Social History Narrative  ? Lives with fiancee for 66yr (Metta Clines  ? [place]   ? Divorced  ? 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 Occupation Retired ANurse, mental health ? Edu:  1 yr college  ? Activity: volunteers at [facility]  ? Diet: good water, fruits/vegetables daily  ?   ? Tested at risk for OSA in preop for CABG  ? ?Social Determinants of Health  ? ?Financial Resource Strain: Low Risk   ? Difficulty of Paying Living Expenses: Not hard at all  ?Food Insecurity: No Food Insecurity  ? Worried About Charity fundraiser in the Last Year: Never true  ? Ran Out of Food in the Last Year: Never true  ?Transportation Needs: No Transportation Needs  ? Lack of Transportation (Medical): No  ? Lack of Transportation (Non-Medical): No  ?Physical Activity: Not on file  ?Stress: No Stress Concern Present  ? Feeling of Stress : Not at all  ?Social Connections: Unknown  ? Frequency of Communication with Friends and Family: More than three times a week  ? Frequency of Social Gatherings with Friends and Family: Once a week  ? Attends Religious Services: Not on file  ? Active Member of Clubs or Organizations: Not on file  ? Attends Archivist Meetings: Not on file  ? Marital Status: Living with partner  ?Intimate Partner Violence: Not At Risk  ? Fear of Current or Ex-Partner: No  ? Emotionally Abused: No  ? Physically Abused: No  ? Sexually Abused: No  ? ?Current Meds  ?Medication Sig  ? acetaminophen (TYLENOL) 500 MG tablet Take 500 mg by mouth every 6 (six) hours as needed for mild pain.   ? aspirin 81 MG EC tablet Take 162 mg by mouth daily. Swallow whole.  ? atorvastatin (LIPITOR) 40 MG tablet TAKE 1 TABLET BY MOUTH  DAILY  ? Cholecalciferol (VITAMIN D-3) 125 MCG (5000 UT) TABS Take by mouth daily.  ? ezetimibe (ZETIA) 10 MG tablet Take 1 tablet (10 mg total) by mouth daily.  ? famotidine (PEPCID) 20 MG tablet TAKE 1 TABLET BY MOUTH 2 TIMES DAILY AS NEEDED FOR  HEARTBURN/INDIGESTION. D/C ZANTAC  ? fluticasone (FLONASE) 50 MCG/ACT nasal spray Place 1 spray into both nostrils as needed for allergies. Reported on 03/18/2016  ? lansoprazole (PREVACID) 30 MG capsule Take 1 capsule (3

## 2022-03-05 ENCOUNTER — Encounter: Payer: Self-pay | Admitting: Internal Medicine

## 2022-03-05 NOTE — Addendum Note (Signed)
Addended by: Orland Mustard on: 03/05/2022 05:32 PM ? ? Modules accepted: Orders ? ?

## 2022-03-06 NOTE — Addendum Note (Signed)
Addended by: Orland Mustard on: 03/06/2022 11:09 AM ? ? Modules accepted: Orders ? ?

## 2022-03-11 ENCOUNTER — Encounter: Payer: Self-pay | Admitting: Internal Medicine

## 2022-03-11 ENCOUNTER — Other Ambulatory Visit: Payer: Self-pay | Admitting: Internal Medicine

## 2022-03-11 DIAGNOSIS — J341 Cyst and mucocele of nose and nasal sinus: Secondary | ICD-10-CM | POA: Insufficient documentation

## 2022-03-11 DIAGNOSIS — M47812 Spondylosis without myelopathy or radiculopathy, cervical region: Secondary | ICD-10-CM | POA: Insufficient documentation

## 2022-03-11 DIAGNOSIS — E010 Iodine-deficiency related diffuse (endemic) goiter: Secondary | ICD-10-CM

## 2022-03-11 DIAGNOSIS — I639 Cerebral infarction, unspecified: Secondary | ICD-10-CM | POA: Insufficient documentation

## 2022-03-11 DIAGNOSIS — I6523 Occlusion and stenosis of bilateral carotid arteries: Secondary | ICD-10-CM | POA: Insufficient documentation

## 2022-03-11 HISTORY — DX: Cerebral infarction, unspecified: I63.9

## 2022-03-11 HISTORY — DX: Cyst and mucocele of nose and nasal sinus: J34.1

## 2022-03-11 NOTE — Addendum Note (Signed)
Addended by: Orland Mustard on: 03/11/2022 06:13 PM ? ? Modules accepted: Orders ? ?

## 2022-03-19 ENCOUNTER — Ambulatory Visit
Admission: RE | Admit: 2022-03-19 | Discharge: 2022-03-19 | Disposition: A | Discharge status: Home / Self Care | Payer: Medicare Other | Source: Ambulatory Visit | Attending: Internal Medicine | Admitting: Internal Medicine

## 2022-03-19 DIAGNOSIS — E010 Iodine-deficiency related diffuse (endemic) goiter: Secondary | ICD-10-CM | POA: Diagnosis present

## 2022-03-19 DIAGNOSIS — I6523 Occlusion and stenosis of bilateral carotid arteries: Secondary | ICD-10-CM | POA: Diagnosis present

## 2022-03-24 ENCOUNTER — Encounter: Payer: Self-pay | Admitting: Internal Medicine

## 2022-03-24 ENCOUNTER — Encounter: Payer: Self-pay | Admitting: Cardiovascular Disease

## 2022-04-15 ENCOUNTER — Ambulatory Visit: Payer: Medicare Other | Admitting: Diagnostic Neuroimaging

## 2022-04-15 ENCOUNTER — Encounter: Payer: Self-pay | Admitting: Diagnostic Neuroimaging

## 2022-04-15 VITALS — BP 143/83 | HR 66 | Ht 72.0 in | Wt 208.6 lb

## 2022-04-15 DIAGNOSIS — I679 Cerebrovascular disease, unspecified: Secondary | ICD-10-CM

## 2022-04-15 NOTE — Progress Notes (Signed)
[facility]  PATIENT: Shannon Chung DOB: 06-22-1941  REFERRING CLINICIAN: [name], Olivia Mackie * HISTORY FROM: patient REASON FOR VISIT: new consult   HISTORICAL  CHIEF COMPLAINT:  Chief Complaint  Patient presents with   Chronic strokes    Rm 7 New pt, wife- [place] "was in [place] and fell, was taken to hospital, stroke found"     HISTORY OF PRESENT ILLNESS:   81 year old male here for evaluation of syncope and abnormal CT head.  01/18/22 patient was in [place] at a motel, feeling under the weather with cold-like symptoms.  He took some allergy medications.  He came out of the hotel room and apparently tripped and fell down.  Unclear whether he passed out or tripped and was not conscious.  Patient went to hospital for evaluation.  He was diagnosed with COVID and evaluated for syncope.  CT of the head showed atrophy and some chronic infarcts.  Patient has never had unilateral numbness, weakness, slurred speech, vision loss or other stroke symptoms in the past.  Since that time patient is back to baseline.  He did note some intermittent numbness and pulling sensation in his right jaw and neck with certain head and neck positions.   REVIEW OF SYSTEMS: Full 14 system review of systems performed and negative with exception of: as per HPI.  ALLERGIES: Allergies  Allergen Reactions   Bee Pollen Itching   Pollen Extract Itching    HOME MEDICATIONS: Outpatient Medications Prior to Visit  Medication Sig Dispense Refill   acetaminophen (TYLENOL) 500 MG tablet Take 500 mg by mouth every 6 (six) hours as needed for mild pain.      aspirin 81 MG EC tablet Take 162 mg by mouth daily. Swallow whole.     atorvastatin (LIPITOR) 40 MG tablet TAKE 1 TABLET BY MOUTH  DAILY 90 tablet 3   Cholecalciferol (VITAMIN D-3) 125 MCG (5000 UT) TABS Take by mouth daily.     ezetimibe (ZETIA) 10 MG tablet Take 1 tablet (10 mg total) by mouth daily. 90 tablet 3   famotidine (PEPCID)  20 MG tablet TAKE 1 TABLET BY MOUTH 2 TIMES DAILY AS NEEDED FOR HEARTBURN/INDIGESTION. D/C ZANTAC 180 tablet 3   fluticasone (FLONASE) 50 MCG/ACT nasal spray Place 1 spray into both nostrils as needed for allergies. Reported on 03/18/2016     lansoprazole (PREVACID) 30 MG capsule Take 1 capsule (30 mg total) by mouth every morning. 90 capsule 3   lisinopril (ZESTRIL) 10 MG tablet Take 1 tablet (10 mg total) by mouth daily. 90 tablet 3   metFORMIN (GLUCOPHAGE) 500 MG tablet Take 1 tablet (500 mg total) by mouth 2 (two) times daily with a meal. 180 tablet 3   metoprolol succinate (TOPROL-XL) 25 MG 24 hr tablet Take 1 tablet (25 mg total) by mouth daily. 90 tablet 2   prednisoLONE acetate (PRED FORTE) 1 % ophthalmic suspension Place 1 drop into both eyes once daily     sildenafil (REVATIO) 20 MG tablet TAKE TWO TO FOUR TABLETS BY MOUTH ONCE DAILY AS NEEDED 30 tablet 11   vitamin B-12 (CYANOCOBALAMIN) 500 MCG tablet Take 500 mcg by mouth daily. Reported on 03/17/2016     No facility-administered medications prior to visit.    PAST MEDICAL HISTORY: Past Medical History:  Diagnosis Date   Coronary artery disease    a. 06/2015 Cardiac CT: Ca score 1103 (84th %'ile);  b. 07/2015 Cath: LM 70, LAD 80p, 100/67m D1 70, D2 95, RI 75, RCA 100p/m;  c. 07/2015 CABG x 5 (LIMA->LAD, VG->Diag, VG->OM1->OM2, VG->OM3).   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  dilated, EF 35% improved to 45-50% (2015);  b. 07/2015 EF 25-35% by LV gram.   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 isolated episode, not on [place].   [place] thrombus of cardiac apex    a. 06/2014: LV; resolved with coumadin-->no residual on f/u echo, no longer on coumadin.   Osteoarthritis    a. R-shoulder, L-knee Sabra Heck ortho)   Skin cancer    squamous and basal cell right forearm, SCC left cheek 10/04/20 sees derm regularly Dr. 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   XRT; right vocal cord; had f/u until 2013 or 2015 [place] [facility]   Stroke [facility])    Thrombocytopenia ([place])    Vitamin D deficiency     PAST SURGICAL HISTORY: Past Surgical History:  Procedure Laterality Date   BICEPS TENDON REPAIR Right 1993   CARDIAC CATHETERIZATION N/A 07/05/2015   Procedure: Left Heart Cath and Coronary Angiography;  Surgeon: Minna Merritts, MD;  Location: [facility];  Service: Cardiovascular;  Laterality: N/A;   CATARACT EXTRACTION Left 12/2016   with keratoplasty   COLONOSCOPY  2007   COLONOSCOPY WITH PROPOFOL N/A 12/02/2017   TA, SSA, rpt 3 yrs(Tahiliani, Varnita B, MD)   COLONOSCOPY WITH PROPOFOL N/A 11/26/2020   Procedure: COLONOSCOPY WITH PROPOFOL;  Surgeon: [name], MD;  Location: [facility] ENDOSCOPY;  Service: Endoscopy;  Laterality: N/A;   CORONARY ARTERY BYPASS GRAFT N/A 07/29/2015   Procedure: CORONARY ARTERY BYPASS GRAFTING (CABG) x 5 (LIMA to LAD, SVG to DIAGONAL,  SVG SEQUENTIALLY to OM1 and OM2, SVG to OM3) with Endoscopic Vein Havesting of  GREATER SAPHENOUS VEIN from RIGHT THIGH and partial LOWER LEG ;  Surgeon: [name], MD;  Location: [place] OR;  Service: Open Heart Surgery;  Laterality: N/A;   EYE SURGERY     b/l cataract and cornea replaced    HAND SURGERY     left hand 1st/2nd trigger fingers Dr. Sabra Heck ortho    JOINT REPLACEMENT     KNEE ARTHROSCOPY Left remote   MOHS SURGERY     left cheek scc 2022 Dr. Winifred Olive   MOHS SURGERY     x 5 facial  scc   right biceps tendon     repair/re attachment    SKIN CANCER EXCISION  10/2015   BCC - L ala (pending MOHs) and L scapula (complete excision)   TEE WITHOUT CARDIOVERSION N/A 07/29/2015   Procedure: TRANSESOPHAGEAL ECHOCARDIOGRAM (TEE);  Surgeon: [name], MD;  Location: [place] [facility];  Service: Open Heart Surgery;  Laterality: N/A;   TONSILLECTOMY  1949   TOTAL KNEE ARTHROPLASTY Left 03/18/2016   cemented L TKR; Earnestine Leys, MD  FAMILY HISTORY: Family History  Problem Relation Age of Onset   CAD Father 20       MI   Hypertension Father    Hyperlipidemia Father    Alcoholism Father    Diabetes Father    Cancer Daughter        dx'ed 89 retroperitoneal liposarcoma     SOCIAL HISTORY: Social History   Socioeconomic History   Marital status: Married    Spouse name: [place]   Number of children: 2   Years of education: Not on file   Highest education level: Some college, no degree  Occupational History    Comment: retired  Tobacco Use   Smoking status: Former    Packs/day: 0.50    Years: 10.00    Total pack years: 5.00    Types: Cigarettes    Quit date: 11/02/1978    Years since quitting: 43.4   Smokeless tobacco: Never   Tobacco comments:    former smoker 1967-1980 1 pk/week no FH lung cancer   Vaping Use   Vaping Use: Never used  Substance and Sexual Activity   Alcohol use: Yes    Alcohol/week: 0.0 standard drinks of alcohol    Comment: beer/wine on weekends   Drug use: No   Sexual activity: Yes  Other Topics Concern   Not on file  Social History Narrative   Lives with wife for 62yr ([place] Cl[place]   [place]    Divorced, one daughter deceased   Retired [employer]   Occupation Retired ANurse, mental health  Edu: 1 yr college   Activity: volunteers at [employer], fruits/vegetables daily      Tested at risk for OSA in preop for CABG   Social Determinants of Health   Financial Resource Strain: [place]  (10/31/2021)   Overall Financial Resource Strain (CARDIA)    Difficulty of Paying Living Expenses: Not hard at all  Food Insecurity: No [place](10/31/2021)   Hunger Vital Sign    Worried About Running Out of Food in the Last Year: Never true    REast [place]in the Last Year: Never true  Transportation Needs: No Transportation Needs (10/31/2021)   PRAPARE - THydrologist(Medical): No    Lack of Transportation (Non-Medical): No  Physical Activity: Unknown (08/14/2019)   Exercise Vital Sign    Days of Exercise per Week: 0 days    Minutes of Exercise per Session: Not on file  Stress: No Stress Concern Present (10/31/2021)   [place]   Feeling of Stress : Not at all  Social Connections: Unknown (10/31/2021)   Social Connection and Isolation Panel [NHANES]    Frequency of Communication with Friends and Family: More than three times a week    Frequency of Social Gatherings with Friends and Family: Once a week    Attends Religious Services: Not on fDiplomatic Services operational officerof Clubs or Organizations: Not on file    Attends CArchivistMeetings: Not on file    Marital Status: Living with partner  Intimate Partner Violence: Not At Risk (10/31/2021)   Humiliation, Afraid, Rape, and Kick questionnaire    Fear of Current or Ex-Partner: No    Emotionally Abused: No    Physically Abused: No    Sexually Abused: No     PHYSICAL EXAM  GENERAL EXAM/CONSTITUTIONAL: Vitals:  Vitals:   04/15/22 1016  BP: (!) 143/83  Pulse: 66  Weight: 208 lb 9.6 oz (94.6 kg)  Height: 6' (1.829 m)   Body mass index is 28.29 kg/m. Wt Readings from Last 3 Encounters:  04/15/22 208 lb 9.6 oz (94.6 kg)  03/04/22 204 lb 1.9 oz (92.6 kg)  10/31/21 218 lb (98.9 kg)   Patient is in no distress; well developed, nourished and groomed SLIGHTLY DECR ROM IN HEAD / NECK WITH RIGHTWARD ROTATION AND  EXTENSION  CARDIOVASCULAR: Examination of carotid arteries is normal; no carotid bruits Regular rate and rhythm, no murmurs Examination of peripheral vascular system by observation and palpation is normal  EYES: Ophthalmoscopic exam of optic discs and posterior segments is normal; no papilledema or hemorrhages No results found.  MUSCULOSKELETAL: Gait, strength, tone, movements noted in Neurologic exam below  NEUROLOGIC: MENTAL STATUS:     02/18/2017    2:47 PM  MMSE - Mini Mental State Exam  Orientation to time 5  Orientation to Place 5  Registration 3  Attention/ Calculation 0  Recall 3  Language- name 2 objects 0  Language- repeat 1  Language- follow 3 step command 3  Language- read & follow direction 0  Write a sentence 0  Copy design 0  Total score 20   awake, alert, oriented to person, place and time recent and remote memory intact normal attention and concentration language fluent, comprehension intact, naming intact fund of knowledge appropriate  CRANIAL NERVE:  2nd - no papilledema on fundoscopic exam 2nd, 3rd, 4th, 6th - pupils equal and reactive to light, visual fields full to confrontation, extraocular muscles intact, no nystagmus 5th - facial sensation symmetric 7th - facial strength symmetric 8th - hearing intact 9th - palate elevates symmetrically, uvula midline 11th - shoulder shrug symmetric 12th - tongue protrusion midline  MOTOR:  normal bulk and tone, full strength in the BUE, BLE  SENSORY:  normal and symmetric to light touch, temperature, vibration  COORDINATION:  finger-nose-finger, fine finger movements normal  REFLEXES:  deep tendon reflexes TRACE and symmetric  GAIT/STATION:  narrow based gait     DIAGNOSTIC DATA (LABS, IMAGING, TESTING) - I reviewed patient records, labs, notes, testing and imaging myself where available.  Lab Results  Component Value Date   WBC 7.0 12/04/2021   HGB 14.0 12/04/2021   HCT 42.2  12/04/2021   MCV 97.5 12/04/2021   PLT 156.0 12/04/2021      Component Value Date/Time   NA 138 12/04/2021 1124   NA 141 11/28/2014 0000   NA 141 11/28/2014 0000   K 4.5 12/04/2021 1124   CL 101 12/04/2021 1124   CO2 30 12/04/2021 1124   GLUCOSE 105 (H) 12/04/2021 1124   BUN 19 12/04/2021 1124   BUN 12 11/28/2014 0000   CREATININE 0.90 12/04/2021 1124   CREATININE 1.06 09/02/2018 0948   CALCIUM 9.6 12/04/2021 1124   PROT 7.1 12/04/2021 1124   PROT 7.4 01/23/2016 0803   ALBUMIN 4.5 12/04/2021 1124   ALBUMIN 4.8 01/23/2016 0803   ALBUMIN 4.0 11/28/2014 0000   AST 18 12/04/2021 1124   AST 21 11/28/2014 0000   AST 21 11/28/2014 0000   ALT 14 12/04/2021 1124   ALT 27 11/28/2014 0000   ALT 27 11/28/2014 0000   ALKPHOS 50 12/04/2021 1124   ALKPHOS 53 11/28/2014 0000   ALKPHOS 53 11/28/2014 0000   BILITOT 0.8 12/04/2021 1124   BILITOT 0.6 01/23/2016 0803   BILITOT  0.5 11/28/2014 0000   BILITOT 0.5 11/28/2014 0000   GFRNONAA >60 11/29/2017 1039   GFRAA >60 11/29/2017 1039   Lab Results  Component Value Date   CHOL 106 12/04/2021   HDL 46.00 12/04/2021   LDLCALC 37 12/04/2021   TRIG 114.0 12/04/2021   CHOLHDL 2 12/04/2021   Lab Results  Component Value Date   HGBA1C 6.6 (H) 12/04/2021   Lab Results  Component Value Date   [id number] 856 10/01/2020   Lab Results  Component Value Date   TSH 1.54 12/04/2021    01/18/22 CT head [I reviewed images myself. Minimal chronic small vessel ischemic disease. -VRP]  - atrophy and chronic infarcts - acute right parietal scalp hematoma  03/19/22 Carotid u/s 1. Right-greater-than-left atherosclerotic plaque of the distal common carotid arteries and carotid bulbs results in less than 50% stenosis by Doppler criteria bilaterally. 2. Bilateral vertebral arteries demonstrate normal antegrade flow.  03/05/22 xray cervical Multilevel degenerative disc and facet disease.   ASSESSMENT AND PLAN  81 y.o. year old male here  with:   Dx:  1. Small vessel disease, cerebrovascular       PLAN:  MINIMAL CEREBRAL SMALL VESSEL DISEASE  - continue medical management (BP, statin, DM control) - no evidence of prior stroke clinically  SYNCOPE (likely related to COVID infx) - continue supportive care; follow up with PCP and cardiology  Return for pending if symptoms worsen or fail to improve, return to PCP.    Penni Bombard, MD 01/13/9701, 6:37 PM Certified in Neurology, Neurophysiology and Neuroimaging  [facility] [address] [place], [place] [address] [phone]

## 2022-04-15 NOTE — Patient Instructions (Signed)
  CEREBRAL SMALL VESSEL DISEASE - continue medical management (BP, statin, DM control) - no evidence of prior stroke clinically  SYNCOPE (likely related to COVID infx) - continue supportive care; follow up with PCP and cardiology

## 2022-04-23 ENCOUNTER — Other Ambulatory Visit: Payer: Self-pay | Admitting: Cardiovascular Disease

## 2022-04-24 ENCOUNTER — Other Ambulatory Visit: Payer: Self-pay | Admitting: *Deleted

## 2022-04-24 MED ORDER — EZETIMIBE 10 MG PO TABS: 10.0000 mg | ORAL_TABLET | Freq: Every day | ORAL | 2 refills | Status: DC

## 2022-04-25 ENCOUNTER — Other Ambulatory Visit: Payer: Self-pay | Admitting: Internal Medicine

## 2022-04-25 DIAGNOSIS — I10 Essential (primary) hypertension: Secondary | ICD-10-CM

## 2022-06-24 ENCOUNTER — Other Ambulatory Visit: Payer: Self-pay | Admitting: Cardiovascular Disease

## 2022-08-10 DIAGNOSIS — G8929 Other chronic pain: Secondary | ICD-10-CM | POA: Insufficient documentation

## 2022-08-14 ENCOUNTER — Encounter: Payer: Self-pay | Admitting: Internal Medicine

## 2022-08-26 ENCOUNTER — Other Ambulatory Visit: Payer: Self-pay

## 2022-08-26 ENCOUNTER — Telehealth: Payer: Self-pay | Admitting: Internal Medicine

## 2022-08-26 DIAGNOSIS — E119 Type 2 diabetes mellitus without complications: Secondary | ICD-10-CM

## 2022-08-26 MED ORDER — METFORMIN HCL 500 MG PO TABS: 500.0000 mg | ORAL_TABLET | Freq: Two times a day (BID) | ORAL | 3 refills | Status: DC

## 2022-08-26 NOTE — Telephone Encounter (Signed)
Refill sent to pharmacy.  Travares Nelles,cma 

## 2022-08-26 NOTE — Telephone Encounter (Signed)
Refill on metFORMIN (GLUCOPHAGE) 500 MG tablet. Send to [facility].

## 2022-09-07 ENCOUNTER — Ambulatory Visit: Payer: Medicare Other

## 2022-09-07 NOTE — Therapy (Incomplete)
OUTPATIENT PHYSICAL THERAPY THORACOLUMBAR EVALUATION   Patient Name: Shannon Chung MRN: [id number] DOB:07-21-1941, 81 y.o., male Today's Date: 09/07/2022    Past Medical History:  Diagnosis Date   Coronary artery disease    a. 06/2015 Cardiac CT: Ca score 1103 (84th %'ile);  b. 07/2015 Cath: LM 70, LAD 80p, 100/41m D1 70, D2 95, RI 75, RCA 100p/m;  c. 07/2015 CABG x 5 (LIMA->LAD, VG->Diag, VG->OM1->OM2, VG->OM3).   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 dilated, EF 35% improved to 45-50% (2015);  b. 07/2015 EF 25-35% by LV gram.   [place] atrial fibrillation ([place] 1983   [place]   [place] thrombus of cardiac apex    a. 06/2014: LV; resolved with coumadin-->no residual on f/u echo, no longer on coumadin.   Osteoarthritis    a. R-shoulder, L-knee (Sabra Heckortho)   Skin cancer    squamous and basal cell right forearm, SCC left cheek 10/04/20 sees derm regularly Dr. 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   XRT; right vocal cord; [place] 2013 [place]   Stroke ([facility]    Thrombocytopenia ([place]    Vitamin D deficiency    Past Surgical History:  Procedure Laterality Date   BICEPS TENDON REPAIR Right 1LeroyN/A 07/05/2015   Procedure: Left Heart Cath and Coronary Angiography;  Surgeon: TMinna Merritts MD;  [place];  Service:  Cardiovascular;  Laterality: N/A;   CATARACT EXTRACTION Left 12/2016   with keratoplasty   COLONOSCOPY  2007   COLONOSCOPY WITH PROPOFOL N/A 12/02/2017   TA, SSA, rpt 3 yrs(Tahiliani, Varnita B, MD)   COLONOSCOPY WITH PROPOFOL N/A 11/26/2020   Procedure: COLONOSCOPY WITH PROPOFOL;  Surgeon: WLucilla Lame MD;  [facility];  Service: Endoscopy;  Laterality: N/A;   CORONARY ARTERY BYPASS GRAFT N/A 07/29/2015   Procedure: CORONARY ARTERY BYPASS GRAFTING (CABG) x 5 (LIMA to LAD, SVG to DIAGONAL,  SVG SEQUENTIALLY to OM1 and OM2, SVG to OM3) with Endoscopic Vein [place] of  GREATER SAPHENOUS VEIN from RIGHT THIGH and partial LOWER LEG ;  Surgeon: BGaye Pollack MD;  [facility] [place];  Service: Open Heart Surgery;  Laterality: N/A;   EYE SURGERY     b/l cataract and cornea replaced    HAND SURGERY     left hand 1st/2nd trigger fingers Dr. MSabra Heckortho    JOINT REPLACEMENT     KNEE ARTHROSCOPY Left remote   MOHS SURGERY     left cheek scc 2022 Dr. MWinifred Olive  MOHS SURGERY     x 5 facial scc   right biceps tendon     repair/re attachment    SKIN CANCER EXCISION  10/2015   BCC - L ala (  pending MOHs) and L scapula (complete excision)   TEE WITHOUT CARDIOVERSION N/A 07/29/2015   Procedure: TRANSESOPHAGEAL ECHOCARDIOGRAM (TEE);  Surgeon: [name], MD;  Location: [place];  Service: Open Heart Surgery;  Laterality: N/A;   TONSILLECTOMY  1949   TOTAL KNEE ARTHROPLASTY Left 03/18/2016   cemented L TKR; Earnestine Leys, MD   Patient Active Problem List   Diagnosis Date Noted   Cerebrovascular accident (CVA) ([place]) 03/11/2022   Cervical spondylosis 03/11/2022   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   Bilateral carotid artery stenosis 03/11/2022   Retention cyst of paranasal sinus 03/11/2022   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   Lumbar spondylosis 10/07/2021   Malignant neoplasm of prostate ([place]) 10/07/2021   DDD (degenerative disc disease), lumbar 10/07/2021   Kidney stones 04/17/2021   Aortic  atherosclerosis ([place]) 04/17/2021   Diverticulosis 04/17/2021   Personal history of colonic polyps    Polyp of colon    Grief 10/07/2020   Adjustment reaction with anxiety and depression 10/07/2020   Hypertension associated with diabetes ([place]) 10/07/2020   Overweight (BMI 25.0-29.9) 10/07/2020   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   Vitamin D deficiency 08/01/2020   Polyp of sigmoid colon 08/01/2020   Anxiety 06/07/2020   Insomnia 06/07/2020   Gastroesophageal reflux disease 04/04/2020   Degenerative joint disease of hand 03/01/2020   Strain of muscle of right hip 08/28/2019   Trigger finger of left hand 07/07/2019   BPH (benign prostatic hyperplasia) 08/05/2018   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   Macular scar of both eyes 04/20/2018   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   Reduced visual acuity 04/20/2018   Scotoma involving central area of both eyes 04/20/2018   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/25/2018   Fatty liver 03/25/2018   PSA elevation 03/25/2018   Thrombocytopenia ([place]) 02/14/2018   Frequent PVCs 02/14/2018   Positive colorectal cancer screening using Cologuard test    Benign neoplasm of cecum    Benign neoplasm of transverse colon    Health maintenance examination 02/23/2017   Erectile dysfunction 02/23/2017   Obesity (BMI 30.0-34.9) 01/25/2017   Hx of CABG 01/24/2017   Medicare annual wellness visit, subsequent 10/21/2015   Advanced care planning/counseling discussion 10/21/2015   Biceps tendinitis 10/10/2015   Coronary artery disease of native artery of native heart with stable angina pectoris ([place])    DM2 (diabetes mellitus, type 2) ([place]) 08/12/2015   History of radiation exposure    Impingement syndrome of shoulder region 05/08/2015   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  ONSET DATE: ***  SUBJECTIVE:  SUBJECTIVE STATEMENT: ***  PERTINENT HISTORY:  Patient has a PMH of left ventricular apical thrombus, ischemic cardiomyopathy, hyperlipidemia, HTN, OA, history of radiation exposure, vitamin D deficiency, DM2, CAD with stable angina pectoris, history of CABG, obesity, ED, Fuch's Corneal Dystrophy, benign neoplasm of cecum, polyp of sigmoid colon, biceps tendinitis, cone dystrophy, shoulder impingement, thrombocytopenia, PSA elevation, BPH, squamous cell carcinoma of vocal cord, gastroesophageal reflux disease, anxiety, insomnia, DJD of the hand, adult onset vitelliform macular dystrophy, macular scar of both eyes, radiation maculopathy, reduced visual activity, scotoma involving central area of both eyes, right hip muscle strain, trigger finger in the left hand, grief, overweight, kidney stones, aortic atherosclerosis, diverticulosis, lumbar spondylosis, prostate cancer, lumbar spine DDD, CVA, cervical spondylosis, thyromegaly, (B) carotid artery stenosis, retention cyst of paranasal sinus.   PAIN:  Are you having pain? {OPRCPAIN:27236}  PRECAUTIONS: {Therapy precautions:24002}  WEIGHT BEARING RESTRICTIONS: {Yes ***/No:24003}  FALLS:  Has patient fallen in last 6 months? {fallsyesno:27318}  LIVING ENVIRONMENT: Lives with: {OPRC lives with:25569::"lives with their family"} Lives in: {Lives in:25570} Stairs: {opstairs:27293} Has following equipment at home: {Assistive  devices:23999}  OCCUPATION: ***  PLOF: {PLOF:24004}  PATIENT GOALS: ***  NEXT MD VISIT:   OBJECTIVE:   DIAGNOSTIC FINDINGS:  ***  PATIENT SURVEYS:  {rehab surveys:24030}  SCREENING FOR RED FLAGS: Bowel or bladder incontinence: {Yes/No:304960894} Spinal tumors: {Yes/No:304960894} Cauda equina syndrome: {Yes/No:304960894} Compression fracture: {Yes/No:304960894} Abdominal aneurysm: {Yes/No:304960894}  COGNITION: Overall cognitive status: {cognition:24006}     SENSATION: {sensation:27233}  MUSCLE LENGTH: Hamstrings: Right *** deg; Left *** deg Thomas test: Right *** deg; Left *** deg  POSTURE: {posture:25561}  PALPATION: ***  LUMBAR ROM:   AROM eval  Flexion   Extension   Right lateral flexion   Left lateral flexion   Right rotation   Left rotation    (Blank rows = not tested)  LOWER EXTREMITY ROM:     {AROM/PROM:27142}  Right eval Left eval  Hip flexion    Hip extension    Hip abduction    Hip adduction    Hip internal rotation    Hip external rotation    Knee flexion    Knee extension    Ankle dorsiflexion    Ankle plantarflexion    Ankle inversion    Ankle eversion     (Blank rows = not tested)  LOWER EXTREMITY MMT:    MMT Right eval Left eval  Hip flexion    Hip extension    Hip abduction    Hip adduction    Hip internal rotation    Hip external rotation    Knee flexion    Knee extension    Ankle dorsiflexion    Ankle plantarflexion    Ankle inversion    Ankle eversion     (Blank rows = not tested)  LUMBAR SPECIAL TESTS:  {lumbar special test:25242}  FUNCTIONAL TESTS:  {Functional tests:24029}  GAIT: Distance walked: *** Assistive device utilized: {Assistive devices:23999} Level of assistance: {Levels of assistance:24026} Comments: ***  TODAY'S TREATMENT:  DATE: ***    PATIENT EDUCATION:   Education details: *** Person educated: {Person educated:25204} Education method: {Education Method:25205} Education comprehension: {Education Comprehension:25206}  HOME EXERCISE PROGRAM: ***  ASSESSMENT:  CLINICAL IMPRESSION: Patient is a *** y.o. *** who was seen today for physical therapy evaluation and treatment for ***.   OBJECTIVE IMPAIRMENTS: {opptimpairments:25111}.   ACTIVITY LIMITATIONS: {activitylimitations:27494}  PARTICIPATION LIMITATIONS: {participationrestrictions:25113}  PERSONAL FACTORS: {Personal factors:25162} are also affecting patient's functional outcome.   REHAB POTENTIAL: {rehabpotential:25112}  CLINICAL DECISION MAKING: {clinical decision making:25114}  EVALUATION COMPLEXITY: {Evaluation complexity:25115}   GOALS: Goals reviewed with patient? {yes/no:20286}  SHORT TERM GOALS: Target date: {follow up:25551}  *** Baseline: Goal status: {GOALSTATUS:25110}  2.  *** Baseline:  Goal status: {GOALSTATUS:25110}  3.  *** Baseline:  Goal status: {GOALSTATUS:25110}  4.  *** Baseline:  Goal status: {GOALSTATUS:25110}  5.  *** Baseline:  Goal status: {GOALSTATUS:25110}  6.  *** Baseline:  Goal status: {GOALSTATUS:25110}  LONG TERM GOALS: Target date: {follow up:25551}  *** Baseline:  Goal status: {GOALSTATUS:25110}  2.  *** Baseline:  Goal status: {GOALSTATUS:25110}  3.  *** Baseline:  Goal status: {GOALSTATUS:25110}  4.  *** Baseline:  Goal status: {GOALSTATUS:25110}  5.  *** Baseline:  Goal status: {GOALSTATUS:25110}  6.  *** Baseline:  Goal status: {GOALSTATUS:25110}  PLAN:  PT FREQUENCY: {rehab frequency:25116}  PT DURATION: {rehab duration:25117}  PLANNED INTERVENTIONS: {rehab planned interventions:25118::"Therapeutic exercises","Therapeutic activity","Neuromuscular re-education","Balance training","Gait training","Patient/Family education","Self Care","Joint mobilization"}.  PLAN FOR NEXT SESSION:  ***   Sudie Bailey, Student-PT 09/07/2022, 7:05 AM

## 2022-09-09 ENCOUNTER — Ambulatory Visit: Payer: Medicare Other | Admitting: Physical Therapy

## 2022-09-14 ENCOUNTER — Ambulatory Visit: Payer: Medicare Other | Attending: Orthopedic Surgery

## 2022-09-14 DIAGNOSIS — M25552 Pain in left hip: Secondary | ICD-10-CM | POA: Insufficient documentation

## 2022-09-14 DIAGNOSIS — M6281 Muscle weakness (generalized): Secondary | ICD-10-CM | POA: Diagnosis present

## 2022-09-14 DIAGNOSIS — R269 Unspecified abnormalities of gait and mobility: Secondary | ICD-10-CM | POA: Diagnosis not present

## 2022-09-14 NOTE — Therapy (Signed)
OUTPATIENT PHYSICAL THERAPY THORACOLUMBAR EVALUATION   Patient Name: Shannon Chung MRN: [id number] DOB:Apr 27, 1941, 81 y.o., male Today's Date: 09/14/2022   PT End of Session - 09/14/22 1445     Visit Number 1    Number of Visits 12    Date for PT Re-Evaluation 12/07/22    Progress Note Due on Visit 10    PT Start Time 1430    PT Stop Time 1515    PT Time Calculation (min) 45 min    Equipment Utilized During Treatment Back brace    Activity Tolerance Patient tolerated treatment well;No increased pain    Behavior During Therapy WFL for tasks assessed/performed             Past Medical History:  Diagnosis Date   Coronary artery disease    a. 06/2015 Cardiac CT: Ca score 1103 (84th %'ile);  b. 07/2015 Cath: LM 70, LAD 80p, 100/28m D1 70, D2 95, RI 75, RCA 100p/m;  c. 07/2015 CABG x 5 (LIMA->LAD, VG->Diag, VG->OM1->OM2, VG->OM3).   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 dilated, EF 35% improved to 45-50% (2015);  b. 07/2015 EF 25-35% by LV gram.   [place] atrial fibrillation ([place]   a. isolated episode, not on O[place]   [place] thrombus of cardiac apex    a. 06/2014: LV; resolved with coumadin-->no residual on f/u echo, no longer on coumadin.   Osteoarthritis    a. R-shoulder, L-knee ([facility] Heck[facility])   Skin cancer    squamous and basal cell right forearm, SCC left cheek 10/04/20 sees derm regularly Dr. 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 07/[place]   Procedure: Left Heart Cath and Coronary Angiography;  Surgeon: TMinna Merritts MD;  [place] [facility];  Service: Cardiovascular;  Laterality: N/A;   CATARACT EXTRACTION Left 12/2016   with keratoplasty   COLONOSCOPY  2007   COLONOSCOPY WITH PROPOFOL N/A 12/02/2017   TA, SSA, rpt 3 yrs(Tahiliani, Varnita B, MD)   COLONOSCOPY WITH PROPOFOL N/A 11/26/2020   Procedure: COLONOSCOPY WITH PROPOFOL;  Surgeon: WLucilla Lame MD;  [facility];  Service: Endoscopy;  Laterality: N/A;   CORONARY ARTERY BYPASS GRAFT N/A 07/29/2015   Procedure: CORONARY ARTERY BYPASS GRAFTING (CABG) x 5 (LIMA to LAD, SVG to DIAGONAL,  SVG SEQUENTIALLY to OM1 and OM2, SVG to OM3) with Endoscopic Vein [place] of  GREATER SAPHENOUS VEIN from RIGHT THIGH and partial LOWER LEG ;  Surgeon: BGaye Pollack MD;  [place]  Service: Open Heart Surgery;  Laterality: N/A;   EYE SURGERY     b/l cataract and cornea replaced    HAND SURGERY     left hand 1st/2nd trigger fingers Dr. Sabra Heck ortho    JOINT REPLACEMENT     KNEE ARTHROSCOPY Left remote   MOHS SURGERY     left cheek scc 2022 Dr. Winifred Olive   MOHS SURGERY     x 5 facial scc   right biceps tendon     repair/re attachment    SKIN CANCER EXCISION  10/2015   BCC - L ala (pending MOHs) and L scapula (complete excision)   TEE WITHOUT CARDIOVERSION N/A 07/29/2015   Procedure: TRANSESOPHAGEAL ECHOCARDIOGRAM (TEE);  Surgeon: [name], MD;  Location: [place];  Service: Open Heart Surgery;  Laterality: N/A;   TONSILLECTOMY  1949   TOTAL KNEE ARTHROPLASTY Left 03/18/2016   cemented L TKR; Earnestine Leys, MD   Patient Active Problem List   Diagnosis Date  Noted   Cerebrovascular accident (CVA) ([place]) 03/11/2022   Cervical spondylosis 03/11/2022   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   Bilateral carotid artery stenosis 03/11/2022   Retention cyst of paranasal sinus 03/11/2022   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   Lumbar spondylosis 10/07/2021   Malignant neoplasm of prostate ([place]) 10/07/2021   DDD (degenerative disc disease), lumbar 10/07/2021   Kidney stones 04/17/2021   Aortic atherosclerosis ([place]) 04/17/2021   Diverticulosis 04/17/2021   Personal history of colonic polyps    Polyp of colon    Grief 10/07/2020   Adjustment reaction with anxiety and depression 10/07/2020   Hypertension associated with diabetes ([place]) 10/07/2020   Overweight (BMI 25.0-29.9) 10/07/2020   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   Vitamin D deficiency 08/01/2020   Polyp of sigmoid colon 08/01/2020   Anxiety 06/07/2020   Insomnia 06/07/2020   Gastroesophageal reflux disease 04/04/2020   Degenerative joint disease of hand 03/01/2020   Strain of muscle of right hip 08/28/2019   Trigger finger of left hand 07/07/2019   BPH (benign prostatic hyperplasia) 08/05/2018   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   Macular scar of both eyes 04/20/2018   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   Reduced visual acuity 04/20/2018   Scotoma involving central area of both eyes 04/20/2018   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/25/2018   Fatty liver 03/25/2018   PSA elevation 03/25/2018   Thrombocytopenia ([place]) 02/14/2018   Frequent PVCs 02/14/2018   Positive colorectal cancer screening using Cologuard test    Benign neoplasm of cecum    Benign neoplasm of transverse colon    Health maintenance examination 02/23/2017   Erectile dysfunction 02/23/2017   Obesity (BMI 30.0-34.9) 01/25/2017   Hx of CABG 01/24/2017   Medicare annual wellness visit, subsequent 10/21/2015   Advanced care planning/counseling discussion 10/21/2015   Biceps tendinitis 10/10/2015   Coronary  artery disease of native artery of native heart with stable angina pectoris ([place])    DM2 (diabetes mellitus, type 2) ([place]) 08/12/2015   History of radiation exposure    Impingement syndrome of shoulder region 05/08/2015   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   Ischemic cardiomyopathy 01/02/2015   Hyperlipidemia 01/02/2015   Essential hypertension 01/02/2015   Osteoarthritis 01/02/2015   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/02/2014   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   Squamous cell carcinoma of vocal cord ([place]) 2008    PCP: Orland Mustard, MD  REFERRING PROVIDER: Hessie Knows, MD  REFERRING DIAG:  [id number] (ICD-10-CM) - Spondylosis without myelopathy or radiculopathy, sacral and sacrococcygeal  region  M41.57 (ICD-10-CM) - Other secondary scoliosis, lumbosacral region    Rationale for Evaluation and Treatment: Rehabilitation  THERAPY DIAG:  Abnormality of gait and mobility  Muscle weakness (generalized)  Pain in left hip  ONSET DATE: 08/21/2022  SUBJECTIVE:                                                                                                                                                                                           SUBJECTIVE STATEMENT: Patient reports he is feeling better since referral for PT was made. States he was originally coming for right side hip pain which his MD states was actually coming from his back. He received referral to PT at that time and SI trigger point injection. He states his right hip has improved but now dealing some posterior left upper leg/buttock pain.   PERTINENT HISTORY:  Patient is a 81 year old male referral for outpatient PT services from orthopedics- complaining of right hip and back pain- history per Dr. Rudene Christians office visit note from 09/02/2022- The patient presents for evaluation of hip and back pain. He had a history of during the weekend having a Halloween walk. It was uneven and in the dark. Since then, he has been having  hip pain, also slept in a recliner and has been having significant back pain. It was uneven and in the dark. Since then, he has been having hip pain.  The patient reports that he walked 3 weeks ago. He reports that when he [place] the pumpkin [place], he was wearing sketchers and stepped on the trail. It was moving up and down and he was unable finish due to his pain. He states that he walked 8000 steps in 4 hours a day and was pushing cart. He walks by himself for short distances. He reports that his artificial knee here is stable and the other side it wanders. He reports gait is uneven. He states Dr Sabra Heck told him that something was off with his hip. He reports that when he does exercise, he wears braces for hip, and it helps with his hips. He reports that he wants to try physical therapy. He reports for recreation he walks but do does not walk regularly.  Received trigger point ultrasound guided Bilateral SI joint steroid injection on 09/02/2022- Paitent reports now no pain currently in right hip but experiencing some left buttock pain over past week.  PAIN:  Are you having pain? Yes: NPRS scale: 3/10 Pain location: Left posterior buttock/upper posterior leg Pain description: Nagging ache- Intermittent Aggravating factors: prolonged sitting Relieving factors: not sure- maybe walking  PRECAUTIONS: None  WEIGHT  BEARING RESTRICTIONS: No  FALLS:  Has patient fallen in last 6 months? No  LIVING ENVIRONMENT: Lives with: lives with their spouse Lives in: House/apartment Stairs: No Has following equipment at home: None  OCCUPATION: Retired from [employer]. 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  NEXT MD VISIT: None  OBJECTIVE:   DIAGNOSTIC FINDINGS:  Nothing recent in medical notes  PATIENT SURVEYS:  Modified Oswestry 24%   SCREENING FOR RED FLAGS: Bowel or bladder  incontinence: No Spinal tumors: No Cauda equina syndrome: No Compression fracture: No Abdominal aneurysm: No  COGNITION: Overall cognitive status: Within functional limits for tasks assessed     SENSATION: WFL  MUSCLE LENGTH: Hamstrings: Right 58 deg; Left 48 deg   POSTURE: No Significant postural limitations  PALPATION: + tenderness along Left proximal posterior thigh  LUMBAR ROM:   AROM eval  Flexion WNL  Extension WNL  Right lateral flexion WNL  Left lateral flexion WNL  Right rotation WNL  Left rotation WNL   (Blank rows = not tested)   LOWER EXTREMITY MMT:    MMT Right eval Left eval  Hip flexion 5 3+  Hip extension 5 4  Hip abduction 5 4  Hip adduction 5 4  Hip internal rotation 5 5  Hip external rotation 5 5  Knee flexion 5 5  Knee extension 5 4  Ankle dorsiflexion 5 5  Ankle plantarflexion 5 5  Ankle inversion 5 5  Ankle eversion 5 5   (Blank rows = not tested)  LUMBAR SPECIAL TESTS:  Straight leg raise test: Negative, Slump test: Negative, and FABER test: Negative  FUNCTIONAL TESTS:  5 times sit to stand: 18.3 sec without UE support  GAIT: Distance walked: in clinic (approx 50 feet)  Assistive device utilized: None Level of assistance: Complete Independence Comments: Reciprocal steps   TODAY'S TREATMENT:                                                                                                                              DATE: 09/14/2022 PT EVAL ONLY and instruction in HEP    PATIENT EDUCATION:  Education details: Purpose of PT and plan of care Person educated: Patient Education method: Explanation, Demonstration, Tactile cues, and Verbal cues Education comprehension: verbalized understanding, returned demonstration, verbal cues required, and needs further education  HOME EXERCISE PROGRAM: Patient instructed in HEP for stretching- Hamstring, knee to chest and figure 4 stretch- hold 30 sec x 4 each LE - 2x/day ([place] system  was down) so will issue handout next visit.   ASSESSMENT:  CLINICAL IMPRESSION: Patient is a 81 y.o. male who was seen today for physical therapy evaluation and treatment for diagnosis of Spondylosis without myelopathy or radiculopathy, sacral and sacrococcygeal region and scoliosis of lumbar region.  PT examination reveals deficits . Pt presents with Left posterior Hip/LE pain, LE muscle  weakness with impaired mobility. Patient will benefit from skilled PT services to address deficits and return to pain-free function at home and work.   OBJECTIVE IMPAIRMENTS: Abnormal gait, decreased activity tolerance, decreased balance, decreased mobility, difficulty walking, impaired perceived functional ability, impaired flexibility, and pain.   ACTIVITY LIMITATIONS: sitting  PARTICIPATION LIMITATIONS: occupation  PERSONAL FACTORS: 1 comorbidity: Hypertension  are also affecting patient's functional outcome.   REHAB POTENTIAL: Good  CLINICAL DECISION MAKING: Evolving/moderate complexity  EVALUATION COMPLEXITY: Low   GOALS: Goals reviewed with patient? Yes  SHORT TERM GOALS: Target date: 10/26/2022  Pt will be independent with HEP in order to improve strength and decrease back pain in order to improve pain-free function at home and work.  Baseline: EVAL= Patient has no formal HEP in place Goal status: INITIAL   LONG TERM GOALS: Target date: 12/07/2022  Pt will improve FOTO to target score of 68 to display perceived improvements in ability to complete ADL's.  Baseline: EVAL = 56 Goal status: INITIAL  2.  Pt will decrease mODI score by at least 13 points in order demonstrate clinically significant reduction in back pain/disability.       Baseline: EVAL= 24 Goal status: INITIAL  3.  Pt will decrease worst back pain as reported on NPRS by at least 2 points in order to demonstrate clinically significant reduction in back pain.  Baseline: EVAL=3/10 Goal status: INITIAL  4.  Pt will decrease  5TSTS by at least 3 seconds in order to demonstrate clinically significant improvement in LE strength.  Baseline: EVAL= 18.88 sec  Goal status: INITIAL    PLAN:  PT FREQUENCY: 1-2x/week  PT DURATION: 12 weeks  PLANNED INTERVENTIONS: Therapeutic exercises, Therapeutic activity, Neuromuscular re-education, Balance training, Gait training, Patient/Family education, Self Care, Joint mobilization, Joint manipulation, Dry Needling, Electrical stimulation, Spinal manipulation, Spinal mobilization, Cryotherapy, Moist heat, and Manual therapy.  PLAN FOR NEXT SESSION: Manual Therapy for pain relief, Add ROM and LE strength to HEP as appropriate.    Lewis Moccasin, PT 09/14/2022, 9:06 PM

## 2022-09-15 ENCOUNTER — Ambulatory Visit: Payer: Medicare Other

## 2022-09-15 DIAGNOSIS — R269 Unspecified abnormalities of gait and mobility: Secondary | ICD-10-CM | POA: Diagnosis not present

## 2022-09-15 DIAGNOSIS — M6281 Muscle weakness (generalized): Secondary | ICD-10-CM

## 2022-09-15 DIAGNOSIS — M25552 Pain in left hip: Secondary | ICD-10-CM

## 2022-09-15 NOTE — Therapy (Signed)
OUTPATIENT PHYSICAL THERAPY THORACOLUMBAR TREATMENT   Patient Name: Shannon Chung MRN: [id number] DOB:Aug 03, 1941, 81 y.o., male Today's Date: 09/15/2022   PT End of Session - 09/15/22 1556     Visit Number 2    Number of Visits 12    Date for PT Re-Evaluation 12/07/22    Progress Note Due on Visit 10    PT Start Time 1600    PT Stop Time 8786    PT Time Calculation (min) 44 min    Equipment Utilized During Treatment Back brace    Activity Tolerance Patient tolerated treatment well;No increased pain    Behavior During Therapy WFL for tasks assessed/performed              Past Medical History:  Diagnosis Date   Coronary artery disease    a. 06/2015 Cardiac CT: Ca score 1103 (84th %'ile);  b. 07/2015 Cath: LM 70, LAD 80p, 100/16m D1 70, D2 95, RI 75, RCA 100p/m;  c. 07/2015 CABG x 5 (LIMA->LAD, VG->Diag, VG->OM1->OM2, VG->OM3).   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 dilated, EF 35% improved to 45-50% (2015);  b. 07/2015 EF 25-35% by LV gram.   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 [place] episode, [place]   [place] thrombus of cardiac apex    a. 06/2014: LV; resolved with coumadin-->no residual on f/u echo, no longer on coumadin.   Osteoarthritis    a. R-shoulder, L-knee ([facility])   Skin cancer    squamous and basal cell right forearm, SCC left cheek 10/04/20 sees derm regularly Dr. 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   XRT; right vocal cord; [place] f[place] 2013 or [place]   [facility]    Thrombocytopenia ([place]    Vitamin D deficiency    Past Surgical History:  Procedure Laterality Date   BICEPS TENDON REPAIR [place] 07/[place]   Procedure: Left Heart Cath and Coronary Angiography;  Surgeon: TMinna Merritts MD;  [facility];  Service: Cardiovascular;  Laterality: N/A;   CATARACT EXTRACTION Left 12/2016   with keratoplasty   COLONOSCOPY  2007   COLONOSCOPY WITH PROPOFOL N/A 12/02/2017   TA, SSA, rpt 3 yrs(Tahiliani, Varnita B, MD)   COLONOSCOPY WITH PROPOFOL N/A 11/26/2020   Procedure: COLONOSCOPY WITH PROPOFOL;  Surgeon: WLucilla Lame MD;  [facility];  Service: Endoscopy;  Laterality: N/A;   CORONARY ARTERY BYPASS GRAFT N/A 07/29/2015   Procedure: CORONARY ARTERY BYPASS GRAFTING (CABG) x 5 (LIMA to LAD, SVG to DIAGONAL,  SVG SEQUENTIALLY to OM1 and OM2, SVG to OM3) with Endoscopic Vein [place] of  GREATER SAPHENOUS VEIN from RIGHT THIGH and partial LOWER LEG ;  Surgeon: BGaye Pollack MD;  [facility];  Service: Open Heart Surgery;  Laterality: N/A;   EYE SURGERY     b/l cataract and cornea replaced    HAND SURGERY     left hand 1st/2nd trigger fingers Dr. Sabra Heck ortho    JOINT REPLACEMENT     KNEE ARTHROSCOPY Left remote   MOHS SURGERY     left cheek scc 2022 Dr. Winifred Olive   MOHS SURGERY     x 5 facial scc   right biceps tendon     repair/re attachment    SKIN CANCER EXCISION  10/2015   BCC - L ala (pending MOHs) and L scapula (complete excision)   TEE WITHOUT CARDIOVERSION N/A 07/29/2015   Procedure: TRANSESOPHAGEAL ECHOCARDIOGRAM (TEE);  Surgeon: [name], MD;  Location: [facility];  Service: Open Heart Surgery;  Laterality: N/A;   TONSILLECTOMY  1949   TOTAL KNEE ARTHROPLASTY Left 03/18/2016   cemented L TKR; Earnestine Leys, MD   Patient Active Problem List   Diagnosis Date  Noted   Cerebrovascular accident (CVA) ([place]) 03/11/2022   Cervical spondylosis 03/11/2022   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   Bilateral carotid artery stenosis 03/11/2022   Retention cyst of paranasal sinus 03/11/2022   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   Lumbar spondylosis 10/07/2021   Malignant neoplasm of prostate ([place]) 10/07/2021   DDD (degenerative disc disease), lumbar 10/07/2021   Kidney stones 04/17/2021   Aortic atherosclerosis ([place]) 04/17/2021   Diverticulosis 04/17/2021   Personal history of colonic polyps    Polyp of colon    Grief 10/07/2020   Adjustment reaction with anxiety and depression 10/07/2020   Hypertension associated with diabetes ([place]) 10/07/2020   Overweight (BMI 25.0-29.9) 10/07/2020   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   Vitamin D deficiency 08/01/2020   Polyp of sigmoid colon 08/01/2020   Anxiety 06/07/2020   Insomnia 06/07/2020   Gastroesophageal reflux disease 04/04/2020   Degenerative joint disease of hand 03/01/2020   Strain of muscle of right hip 08/28/2019   Trigger finger of left hand 07/07/2019   BPH (benign prostatic hyperplasia) 08/05/2018   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   Macular scar of both eyes 04/20/2018   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   Reduced visual acuity 04/20/2018   Scotoma involving central area of both eyes 04/20/2018   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/25/2018   Fatty liver 03/25/2018   PSA elevation 03/25/2018   Thrombocytopenia ([place]) 02/14/2018   Frequent PVCs 02/14/2018   Positive colorectal cancer screening using Cologuard test    Benign neoplasm of cecum    Benign neoplasm of transverse colon    Health maintenance examination 02/23/2017   Erectile dysfunction 02/23/2017   Obesity (BMI 30.0-34.9) 01/25/2017   Hx of CABG 01/24/2017   Medicare annual wellness visit, subsequent 10/21/2015   Advanced care planning/counseling discussion 10/21/2015   Biceps tendinitis 10/10/2015   Coronary  artery disease of native artery of native heart with stable angina pectoris ([place])    DM2 (diabetes mellitus, type 2) ([place]) 08/12/2015   History of radiation exposure    Impingement syndrome of shoulder region 05/08/2015   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   Ischemic cardiomyopathy 01/02/2015   Hyperlipidemia 01/02/2015   Essential hypertension 01/02/2015   Osteoarthritis 01/02/2015   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/02/2014   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   Squamous cell carcinoma of vocal cord ([place]) 2008    PCP: Orland Mustard, MD  REFERRING PROVIDER: Hessie Knows, MD  REFERRING DIAG:  [phone] (ICD-10-CM) - Spondylosis without myelopathy or radiculopathy, sacral  and sacrococcygeal region  M41.57 (ICD-10-CM) - Other secondary scoliosis, lumbosacral region    Rationale for Evaluation and Treatment: Rehabilitation  THERAPY DIAG:  Muscle weakness (generalized)  Abnormality of gait and mobility  Pain in left hip  ONSET DATE: 08/21/2022  SUBJECTIVE:                                                                                                                                                                                           SUBJECTIVE STATEMENT: Didn't volunteer today.   PERTINENT HISTORY:  Patient is a 81 year old male referral for outpatient PT services from orthopedics- complaining of right hip and back pain- history per Dr. Rudene Christians office visit note from 09/02/2022- The patient presents for evaluation of hip and back pain. He had a history of during the weekend having a Halloween walk. It was uneven and in the dark. Since then, he has been having hip pain, also slept in a recliner and has been having significant back pain. It was uneven and in the dark. Since then, he has been having hip pain.  The patient reports that he walked 3 weeks ago. He reports that when he went to the pumpkin [place], he was wearing sketchers and stepped on the trail. It was moving up and  down and he was unable finish due to his pain. He states that he walked 8000 steps in 4 hours a day and was pushing cart. He walks by himself for short distances. He reports that his artificial knee here is stable and the other side it wanders. He reports gait is uneven. He states Dr Sabra Heck told him that something was off with his hip. He reports that when he does exercise, he wears braces for hip, and it helps with his hips. He reports that he wants to try physical therapy. He reports for recreation he walks but do does not walk regularly.  Received trigger point ultrasound guided Bilateral SI joint steroid injection on 09/02/2022- Paitent reports now no pain currently in right hip but experiencing some left buttock pain over past week.  PAIN:  Are you having pain? Yes: NPRS scale: 3/10 Pain location: Left posterior buttock/upper posterior leg Pain description: Nagging ache- Intermittent Aggravating factors: prolonged sitting Relieving factors: not sure- maybe walking  PRECAUTIONS: None  WEIGHT BEARING RESTRICTIONS: No  FALLS:  Has patient fallen in last 6 months? No  LIVING ENVIRONMENT: Lives with: lives with their spouse Lives in: House/apartment Stairs: No Has following equipment at home: None  OCCUPATION: Retired from [employer]. 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  NEXT MD VISIT: None  OBJECTIVE:   DIAGNOSTIC FINDINGS:  Nothing recent in medical notes  PATIENT SURVEYS:  Modified Oswestry 24%   SCREENING FOR RED FLAGS: Bowel or bladder incontinence: No Spinal tumors: No Cauda equina syndrome: No Compression fracture: No Abdominal aneurysm: No  COGNITION: Overall cognitive status: Within functional limits for tasks assessed     SENSATION: WFL  MUSCLE LENGTH: Hamstrings: Right 58 deg; Left 48 deg   POSTURE: No Significant postural  limitations  PALPATION: + tenderness along Left proximal posterior thigh  LUMBAR ROM:   AROM eval  Flexion WNL  Extension WNL  Right lateral flexion WNL  Left lateral flexion WNL  Right rotation WNL  Left rotation WNL   (Blank rows = not tested)   LOWER EXTREMITY MMT:    MMT Right eval Left eval  Hip flexion 5 3+  Hip extension 5 4  Hip abduction 5 4  Hip adduction 5 4  Hip internal rotation 5 5  Hip external rotation 5 5  Knee flexion 5 5  Knee extension 5 4  Ankle dorsiflexion 5 5  Ankle plantarflexion 5 5  Ankle inversion 5 5  Ankle eversion 5 5   (Blank rows = not tested)  LUMBAR SPECIAL TESTS:  Straight leg raise test: Negative, Slump test: Negative, and FABER test: Negative  FUNCTIONAL TESTS:  5 times sit to stand: 18.3 sec without UE support  GAIT: Distance walked: in clinic (approx 50 feet)  Assistive device utilized: None Level of assistance: Complete Independence Comments: Reciprocal steps   TODAY'S TREATMENT:                                                                                                                              DATE: 09/14/2022  Supine:  Single knee to chest 60 seconds Hamstring lengthening stretch 60 seconds Figure four stretch 60 seconds LE rotation 60 seconds x 3 trials  SAD with belt 4x30 seconds inferior direction  Posterior pelvic tilt 10x 5 second holds   Sidelying:  Clamshells 15x each LE Reverse clamshells 15x each LE  Prone grade II mobilizations to lumbar spine UPA and CPA x4 minutes Roller to L glute x 4 minutes Review HEP: see below   PATIENT EDUCATION:  Education details: Purpose of PT and plan of care Person educated: Patient Education method: Explanation, Demonstration, Tactile cues, and Verbal cues Education comprehension: verbalized understanding, returned demonstration, verbal cues required, and needs further education  HOME EXERCISE PROGRAM: Access Code: [email or web address] URL:  [email or web address] Date: 09/15/2022 Prepared by: Janna Arch  Exercises - Seated Piriformis Stretch  - 1 x daily - 7 x weekly - 2 sets - 2 reps - 30 hold - Seated Piriformis Stretch  - 1 x daily - 7 x weekly - 2 sets - 2 reps - 30 hold - Supine Figure 4 Piriformis Stretch  - 1 x daily - 7 x weekly -  2 sets - 2 reps - 30 hold - Hooklying Single Knee to Chest  - 1 x daily - 7 x weekly - 2 sets - 10 reps - 5 hold  ASSESSMENT:  CLINICAL IMPRESSION: Patient brought old HEP; reviewed with patient with patient verbalizing understanding in addition to new HEP given to patient with patient demonstrating understanding.Patient is highly motivated throughout session, has pain relief with distraction and roller to L gluteal.  Patient will benefit from skilled PT services to address deficits and return to pain-free function at home and work.   OBJECTIVE IMPAIRMENTS: Abnormal gait, decreased activity tolerance, decreased balance, decreased mobility, difficulty walking, impaired perceived functional ability, impaired flexibility, and pain.   ACTIVITY LIMITATIONS: sitting  PARTICIPATION LIMITATIONS: occupation  PERSONAL FACTORS: 1 comorbidity: Hypertension  are also affecting patient's functional outcome.   REHAB POTENTIAL: Good  CLINICAL DECISION MAKING: Evolving/moderate complexity  EVALUATION COMPLEXITY: Low   GOALS: Goals reviewed with patient? Yes  SHORT TERM GOALS: Target date: 10/27/2022  Pt will be independent with HEP in order to improve strength and decrease back pain in order to improve pain-free function at home and work.  Baseline: EVAL= Patient has no formal HEP in place Goal status: INITIAL   LONG TERM GOALS: Target date: 12/08/2022  Pt will improve FOTO to target score of 68 to display perceived improvements in ability to complete ADL's.  Baseline: EVAL = 56 Goal status: INITIAL  2.  Pt will decrease mODI score by at least 13 points in order demonstrate  clinically significant reduction in back pain/disability.       Baseline: EVAL= 24 Goal status: INITIAL  3.  Pt will decrease worst back pain as reported on NPRS by at least 2 points in order to demonstrate clinically significant reduction in back pain.  Baseline: EVAL=3/10 Goal status: INITIAL  4.  Pt will decrease 5TSTS by at least 3 seconds in order to demonstrate clinically significant improvement in LE strength.  Baseline: EVAL= 18.88 sec  Goal status: INITIAL    PLAN:  PT FREQUENCY: 1-2x/week  PT DURATION: 12 weeks  PLANNED INTERVENTIONS: Therapeutic exercises, Therapeutic activity, Neuromuscular re-education, Balance training, Gait training, Patient/Family education, Self Care, Joint mobilization, Joint manipulation, Dry Needling, Electrical stimulation, Spinal manipulation, Spinal mobilization, Cryotherapy, Moist heat, and Manual therapy.  PLAN FOR NEXT SESSION: Manual Therapy for pain relief,   Janna Arch, PT 09/15/2022, 4:45 PM

## 2022-09-16 ENCOUNTER — Ambulatory Visit: Payer: Medicare Other

## 2022-09-22 ENCOUNTER — Institutional Professional Consult (permissible substitution): Payer: Medicare Other | Admitting: Radiation Oncology

## 2022-09-22 ENCOUNTER — Encounter: Payer: Medicare Other | Admitting: Family Medicine

## 2022-09-26 ENCOUNTER — Other Ambulatory Visit: Payer: Self-pay | Admitting: Cardiovascular Disease

## 2022-09-29 ENCOUNTER — Ambulatory Visit: Payer: Medicare Other | Admitting: Physical Therapy

## 2022-09-29 DIAGNOSIS — R269 Unspecified abnormalities of gait and mobility: Secondary | ICD-10-CM

## 2022-09-29 DIAGNOSIS — M6281 Muscle weakness (generalized): Secondary | ICD-10-CM

## 2022-09-29 DIAGNOSIS — M25552 Pain in left hip: Secondary | ICD-10-CM

## 2022-09-29 NOTE — Therapy (Signed)
OUTPATIENT PHYSICAL THERAPY THORACOLUMBAR TREATMENT   Patient Name: Shannon Chung MRN: [id number] DOB:1941-08-14, 81 y.o., male Today's Date: 09/29/2022   PT End of Session - 09/29/22 1425     Visit Number 3    Number of Visits 12    Date for PT Re-Evaluation 12/07/22    Progress Note Due on Visit 10    PT Start Time 1430    PT Stop Time 1515    PT Time Calculation (min) 45 min    Equipment Utilized During Treatment Back brace    Activity Tolerance Patient tolerated treatment well;No increased pain    Behavior During Therapy WFL for tasks assessed/performed               Past Medical History:  Diagnosis Date   Coronary artery disease    a. 06/2015 Cardiac CT: Ca score 1103 (84th %'ile);  b. 07/2015 Cath: LM 70, LAD 80p, 100/25m D1 70, D2 95, RI 75, RCA 100p/m;  c. 07/2015 CABG x 5 (LIMA->LAD, VG->Diag, VG->OM1->OM2, VG->OM3).   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 dilated, EF 35% improved to 45-50% (2015);  b. 07/2015 EF 25-35% by LV gram.   [place]one atrial fibrillation ([place] 1983   a. isolated episode, not on ORio Oso   Mural thrombus of cardiac apex    a. 06/2014: LV; resolved with coumadin-->no residual on f/u echo, no longer on coumadin.   Osteoarthritis    a. R-shoulder, L-knee ([place]abra Heckortho)   Skin cancer    squamous and basal cell right forearm, SCC left cheek 10/04/20 sees derm regularly Dr. 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   XRT; right vocal cord; [place] 2013 [place]   St[facility] ([facility]    Thrombocytopenia ([place]    Vitamin D deficiency    Past Surgical History:  Procedure Laterality Date   BICEPS TENDON REPAIR Right [place] 07/05/2015   Procedure: Left Heart Cath and Coronary Angiography;  Surgeon: TMinna Merritts MD;  [facility];  Service: Cardiovascular;  Laterality: N/A;   CATARACT EXTRACTION Left 12/2016   with keratoplasty   COLONOSCOPY  2007   COLONOSCOPY WITH PROPOFOL N/A 12/02/2017   TA, SSA, rpt 3 yrs(Tahiliani, Varnita B, MD)   COLONOSCOPY WITH PROPOFOL N/A 11/26/2020   Procedure: COLONOSCOPY WITH PROPOFOL;  Surgeon: WLucilla Lame MD;  [facility];  Service: Endoscopy;  Laterality: N/A;   CORONARY ARTERY BYPASS GRAFT N/A 07/29/2015   Procedure: CORONARY ARTERY BYPASS GRAFTING (CABG) x 5 (LIMA to LAD, SVG to DIAGONAL,  SVG SEQUENTIALLY to OM1 and OM2, SVG to OM3) with Endoscopic Vein Hav[place] of  GREATER SAPHENOUS VEIN from RIGHT THIGH and partial LOWER LEG ;  Surgeon: BGaye Pollack MD;  Location:  [facility];  Service: Open Heart Surgery;  Laterality: N/A;   EYE SURGERY     b/l cataract and cornea replaced    HAND SURGERY     left hand 1st/2nd trigger fingers Dr. Sabra Heck ortho    JOINT REPLACEMENT     KNEE ARTHROSCOPY Left remote   MOHS SURGERY     left cheek scc 2022 Dr. Winifred Olive   MOHS SURGERY     x 5 facial scc   right biceps tendon     repair/re attachment    SKIN CANCER EXCISION  10/2015   BCC - L ala (pending MOHs) and L scapula (complete excision)   TEE WITHOUT CARDIOVERSION N/A 07/29/2015   Procedure: TRANSESOPHAGEAL ECHOCARDIOGRAM (TEE);  Surgeon: [name], MD;  Location: [place];  Service: Open Heart Surgery;  Laterality: N/A;   TONSILLECTOMY  1949   TOTAL KNEE ARTHROPLASTY Left 03/18/2016   cemented L TKR; Earnestine Leys, MD   Patient Active Problem List   Diagnosis Date  Noted   Cerebrovascular accident (CVA) ([place]) 03/11/2022   Cervical spondylosis 03/11/2022   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   Bilateral carotid artery stenosis 03/11/2022   Retention cyst of paranasal sinus 03/11/2022   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   Lumbar spondylosis 10/07/2021   Malignant neoplasm of prostate ([place]) 10/07/2021   DDD (degenerative disc disease), lumbar 10/07/2021   Kidney stones 04/17/2021   Aortic atherosclerosis ([place]) 04/17/2021   Diverticulosis 04/17/2021   Personal history of colonic polyps    Polyp of colon    Grief 10/07/2020   Adjustment reaction with anxiety and depression 10/07/2020   Hypertension associated with diabetes ([place]) 10/07/2020   Overweight (BMI 25.0-29.9) 10/07/2020   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   Vitamin D deficiency 08/01/2020   Polyp of sigmoid colon 08/01/2020   Anxiety 06/07/2020   Insomnia 06/07/2020   Gastroesophageal reflux disease 04/04/2020   Degenerative joint disease of hand 03/01/2020   Strain of muscle of right hip 08/28/2019   Trigger finger of left hand 07/07/2019   BPH (benign prostatic hyperplasia) 08/05/2018   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   Macular scar of both eyes 04/20/2018   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   Reduced visual acuity 04/20/2018   Scotoma involving central area of both eyes 04/20/2018   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/25/2018   Fatty liver 03/25/2018   PSA elevation 03/25/2018   Thrombocytopenia ([place]) 02/14/2018   Frequent PVCs 02/14/2018   Positive colorectal cancer screening using Cologuard test    Benign neoplasm of cecum    Benign neoplasm of transverse colon    Health maintenance examination 02/23/2017   Erectile dysfunction 02/23/2017   Obesity (BMI 30.0-34.9) 01/25/2017   Hx of CABG 01/24/2017   Medicare annual wellness visit, subsequent 10/21/2015   Advanced care planning/counseling discussion 10/21/2015   Biceps tendinitis 10/10/2015   Coronary  artery disease of native artery of native heart with stable angina pectoris ([place])    DM2 (diabetes mellitus, type 2) ([place]) 08/12/2015   History of radiation exposure    Impingement syndrome of shoulder region 05/08/2015   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   Ischemic cardiomyopathy 01/02/2015   Hyperlipidemia 01/02/2015   Essential hypertension 01/02/2015   Osteoarthritis 01/02/2015   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/02/2014   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   Squamous cell carcinoma of vocal cord ([place]) 2008    PCP: Orland Mustard, MD  REFERRING PROVIDER: Hessie Knows, MD  REFERRING DIAG:  [id number] (ICD-10-CM) - Spondylosis without myelopathy or radiculopathy,  sacral and sacrococcygeal region  M41.57 (ICD-10-CM) - Other secondary scoliosis, lumbosacral region    Rationale for Evaluation and Treatment: Rehabilitation  THERAPY DIAG:  Muscle weakness (generalized)  Abnormality of gait and mobility  Pain in left hip  ONSET DATE: 08/21/2022  SUBJECTIVE:                                                                                                                                                                                           SUBJECTIVE STATEMENT: Pt has new onset of hamstring pain on the left side. Pt has been using heating pad and injured the left posterior hip moving a table leaf in preparation for thanksgiving.   PERTINENT HISTORY:  Patient is a 81 year old male referral for outpatient PT services from orthopedics- complaining of right hip and back pain- history per Dr. Rudene Christians office visit note from 09/02/2022- The patient presents for evaluation of hip and back pain. He had a history of during the weekend having a Halloween walk. It was uneven and in the dark. Since then, he has been having hip pain, also slept in a recliner and has been having significant back pain. It was uneven and in the dark. Since then, he has been having hip pain.  The patient reports that  he walked 3 weeks ago. He reports that when he went to the pumpkin [place], he was wearing sketchers and stepped on the trail. It was moving up and down and he was unable finish due to his pain. He states that he walked 8000 steps in 4 hours a day and was pushing cart. He walks by himself for short distances. He reports that his artificial knee here is stable and the other side it wanders. He reports gait is uneven. He states Dr Sabra Heck told him that something was off with his hip. He reports that when he does exercise, he wears braces for hip, and it helps with his hips. He reports that he wants to try physical therapy. He reports for recreation he walks but do does not walk regularly.  Received trigger point ultrasound guided Bilateral SI joint steroid injection on 09/02/2022- Paitent reports now no pain currently in right hip but experiencing some left buttock pain over past week.  PAIN:  Are you having pain? Yes: NPRS scale: 8/10 Pain location: Left posterior buttock/ hamstring Pain description: Nagging ache- Intermittent Aggravating factors:   Relieving factors: standing,pushing patients   PRECAUTIONS: None  WEIGHT BEARING RESTRICTIONS: No  FALLS:  Has patient fallen in last 6 months? No  LIVING ENVIRONMENT: Lives with: lives with their spouse Lives in: House/apartment Stairs: No Has  following equipment at home: None  OCCUPATION: Retired from [employer]. 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  NEXT MD VISIT: None  OBJECTIVE:   DIAGNOSTIC FINDINGS:  Nothing recent in medical notes  PATIENT SURVEYS:  Modified Oswestry 24%   SCREENING FOR RED FLAGS: Bowel or bladder incontinence: No Spinal tumors: No Cauda equina syndrome: No Compression fracture: No Abdominal aneurysm: No  COGNITION: Overall cognitive status: Within functional limits for tasks  assessed     SENSATION: WFL  MUSCLE LENGTH: Hamstrings: Right 58 deg; Left 48 deg   POSTURE: No Significant postural limitations  PALPATION: + tenderness along Left proximal posterior thigh  LUMBAR ROM:   AROM eval  Flexion WNL  Extension WNL  Right lateral flexion WNL  Left lateral flexion WNL  Right rotation WNL  Left rotation WNL   (Blank rows = not tested)   LOWER EXTREMITY MMT:    MMT Right eval Left eval  Hip flexion 5 3+  Hip extension 5 4  Hip abduction 5 4  Hip adduction 5 4  Hip internal rotation 5 5  Hip external rotation 5 5  Knee flexion 5 5  Knee extension 5 4  Ankle dorsiflexion 5 5  Ankle plantarflexion 5 5  Ankle inversion 5 5  Ankle eversion 5 5   (Blank rows = not tested)  LUMBAR SPECIAL TESTS:  Straight leg raise test: Negative, Slump test: Negative, and FABER test: Negative  FUNCTIONAL TESTS:  5 times sit to stand: 18.3 sec without UE support  GAIT: Distance walked: in clinic (approx 50 feet)  Assistive device utilized: None Level of assistance: Complete Independence Comments: Reciprocal steps   TODAY'S TREATMENT:                                                                                                                              DATE: 09/29/22   Supine:  Hamstring lengthening stretch 2* 60 seconds Figure four stretch 60 seconds LE rotation 10 seconds x 10 sec   Manual:  HS palpation and Tp release with IASTM x 6 min  Posterior pelvic tilt 10x 5 second holds   Sidelying:  HS insertion palpation ( painful) and ischemic TP release  Clamshells 2*15 each LE Reverse clamshells 15x each LE  Prone: TP release with movement. X 6 TP in semi tendinosis and semimembranosus.   Supine self HS stretch in 90/90 position x 45 seconds   Seated self HS stretch x 30 seconds, both HS stretches added to HEP and handout provided.   Pt reports improved pain symptoms following.   PATIENT EDUCATION:  Education details: Purpose of  PT and plan of care Person educated: Patient Education method: Explanation, Demonstration, Tactile cues, and Verbal cues Education comprehension: verbalized understanding, returned demonstration, verbal cues required, and needs further education  HOME EXERCISE PROGRAM: Access Code: [email or web address] URL: [email or web address]  Date: 09/15/2022 Prepared by: Janna Arch  Exercises - Seated Piriformis Stretch  - 1 x daily - 7 x weekly - 2 sets - 2 reps - 30 hold - Seated Piriformis Stretch  - 1 x daily - 7 x weekly - 2 sets - 2 reps - 30 hold - Supine Figure 4 Piriformis Stretch  - 1 x daily - 7 x weekly - 2 sets - 2 reps - 30 hold - Hooklying Single Knee to Chest  - 1 x daily - 7 x weekly - 2 sets - 10 reps - 5 hold -Hamstring stretch in seated and supine position 2 x 30 second holds  ASSESSMENT:  CLINICAL IMPRESSION: Pt presents with excellent motivation for completion of PT activities. Pt had new onset of insertional Hamstring pain on the left ( involved ) side and due to the significant pain this was the focus of treatment today. Pt responded well to treatment and did continue with some of previous POC. Pt will continue to benefit from skilled physical therapy intervention to address impairments, improve QOL, and attain therapy goals.    OBJECTIVE IMPAIRMENTS: Abnormal gait, decreased activity tolerance, decreased balance, decreased mobility, difficulty walking, impaired perceived functional ability, impaired flexibility, and pain.   ACTIVITY LIMITATIONS: sitting  PARTICIPATION LIMITATIONS: occupation  PERSONAL FACTORS: 1 comorbidity: Hypertension  are also affecting patient's functional outcome.   REHAB POTENTIAL: Good  CLINICAL DECISION MAKING: Evolving/moderate complexity  EVALUATION COMPLEXITY: Low   GOALS: Goals reviewed with patient? Yes  SHORT TERM GOALS: Target date: 11/10/2022  Pt will be independent with HEP in order to improve strength and decrease back pain  in order to improve pain-free function at home and work.  Baseline: EVAL= Patient has no formal HEP in place Goal status: INITIAL   LONG TERM GOALS: Target date: 12/22/2022  Pt will improve FOTO to target score of 68 to display perceived improvements in ability to complete ADL's.  Baseline: EVAL = 56 Goal status: INITIAL  2.  Pt will decrease mODI score by at least 13 points in order demonstrate clinically significant reduction in back pain/disability.       Baseline: EVAL= 24 Goal status: INITIAL  3.  Pt will decrease worst back pain as reported on NPRS by at least 2 points in order to demonstrate clinically significant reduction in back pain.  Baseline: EVAL=3/10 Goal status: INITIAL  4.  Pt will decrease 5TSTS by at least 3 seconds in order to demonstrate clinically significant improvement in LE strength.  Baseline: EVAL= 18.88 sec  Goal status: INITIAL    PLAN:  PT FREQUENCY: 1-2x/week  PT DURATION: 12 weeks  PLANNED INTERVENTIONS: Therapeutic exercises, Therapeutic activity, Neuromuscular re-education, Balance training, Gait training, Patient/Family education, Self Care, Joint mobilization, Joint manipulation, Dry Needling, Electrical stimulation, Spinal manipulation, Spinal mobilization, Cryotherapy, Moist heat, and Manual therapy.  PLAN FOR NEXT SESSION: Manual Therapy for pain relief   Particia Lather, PT 09/29/2022, 3:38 PM

## 2022-09-30 NOTE — Therapy (Signed)
OUTPATIENT PHYSICAL THERAPY THORACOLUMBAR TREATMENT   Patient Name: Shannon Chung MRN: [id number] DOB:11/12/1940, 81 y.o., male Today's Date: 09/30/2022       Past Medical History:  Diagnosis Date   Coronary artery disease    a. 06/2015 Cardiac CT: Ca score 1103 (84th %'ile);  b. 07/2015 Cath: LM 70, LAD 80p, 100/78m D1 70, D2 95, RI 75, RCA 100p/m;  c. 07/2015 CABG x 5 (LIMA->LAD, VG->Diag, VG->OM1->OM2, VG->OM3).   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 dilated, EF 35% improved to 45-50% (2015);  b. 07/2015 EF 25-35% by LV gram.   Lone atrial fibrillation ([place]   a. isolated episode, not [place]   [place] thrombus of cardiac apex    a. 06/2014: LV; resolved with coumadin-->no residual on f/u echo, no longer on coumadin.   Osteoarthritis    a. R-shoulder, L-knee (Sabra Heckortho)   Skin cancer    squamous and basal cell right forearm, SCC left cheek 10/04/20 sees derm regularly Dr. 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   XRT; right vocal cord; had f[place] until 2013 or 2015 [place]   Stroke ([facility]    Thrombocytopenia ([place]    Vitamin D deficiency    Past Surgical History:  Procedure Laterality Date   BICEPS TENDON REPAIR Right [place] 07/05/2015   Procedure: Left Heart Cath and Coronary Angiography;  Surgeon: TMinna Merritts MD;  [facility] [place];  Service:  Cardiovascular;  Laterality: N/A;   CATARACT EXTRACTION Left 12/2016   with keratoplasty   COLONOSCOPY  2007   COLONOSCOPY WITH PROPOFOL N/A 12/02/2017   TA, SSA, rpt 3 yrs(Tahiliani, Varnita B, MD)   COLONOSCOPY WITH PROPOFOL N/A 11/26/2020   Procedure: COLONOSCOPY WITH PROPOFOL;  Surgeon: WLucilla Lame MD;  [facility];  Service: Endoscopy;  Laterality: N/A;   CORONARY ARTERY BYPASS GRAFT N/A 07/29/2015   Procedure: CORONARY ARTERY BYPASS GRAFTING (CABG) x 5 (LIMA to LAD, SVG to DIAGONAL,  SVG SEQUENTIALLY to OM1 and OM2, SVG to OM3) with Endoscopic Vein [place] of  GREATER SAPHENOUS VEIN from RIGHT THIGH and partial LOWER LEG ;  Surgeon: BGaye Pollack MD;  [facility] [place];  Service: Open Heart Surgery;  Laterality: N/A;   EYE SURGERY     b/l cataract and cornea replaced    HAND SURGERY     left hand 1st/2nd trigger fingers Dr. MSabra Heckortho    JOINT REPLACEMENT     KNEE ARTHROSCOPY Left remote   MOHS SURGERY     left cheek scc 2022 Dr. MWinifred Olive  MOHS SURGERY     x 5 facial scc   right biceps tendon     repair/re attachment    SKIN CANCER EXCISION  10/2015   BCC -  L ala (pending MOHs) and L scapula (complete excision)   TEE WITHOUT CARDIOVERSION N/A 07/29/2015   Procedure: TRANSESOPHAGEAL ECHOCARDIOGRAM (TEE);  Surgeon: [name], MD;  Location: [place];  Service: Open Heart Surgery;  Laterality: N/A;   TONSILLECTOMY  1949   TOTAL KNEE ARTHROPLASTY Left 03/18/2016   cemented L TKR; Earnestine Leys, MD   Patient Active Problem List   Diagnosis Date Noted   Cerebrovascular accident (CVA) ([place]) 03/11/2022   Cervical spondylosis 03/11/2022   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   Bilateral carotid artery stenosis 03/11/2022   Retention cyst of paranasal sinus 03/11/2022   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   Lumbar spondylosis 10/07/2021   Malignant neoplasm of prostate ([place]) 10/07/2021   DDD (degenerative disc disease), lumbar 10/07/2021   Kidney stones 04/17/2021   Aortic  atherosclerosis ([place]) 04/17/2021   Diverticulosis 04/17/2021   Personal history of colonic polyps    Polyp of colon    Grief 10/07/2020   Adjustment reaction with anxiety and depression 10/07/2020   Hypertension associated with diabetes ([place]) 10/07/2020   Overweight (BMI 25.0-29.9) 10/07/2020   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   Vitamin D deficiency 08/01/2020   Polyp of sigmoid colon 08/01/2020   Anxiety 06/07/2020   Insomnia 06/07/2020   Gastroesophageal reflux disease 04/04/2020   Degenerative joint disease of hand 03/01/2020   Strain of muscle of right hip 08/28/2019   Trigger finger of left hand 07/07/2019   BPH (benign prostatic hyperplasia) 08/05/2018   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   Macular scar of both eyes 04/20/2018   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   Reduced visual acuity 04/20/2018   Scotoma involving central area of both eyes 04/20/2018   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/25/2018   Fatty liver 03/25/2018   PSA elevation 03/25/2018   Thrombocytopenia ([place]) 02/14/2018   Frequent PVCs 02/14/2018   Positive colorectal cancer screening using Cologuard test    Benign neoplasm of cecum    Benign neoplasm of transverse colon    Health maintenance examination 02/23/2017   Erectile dysfunction 02/23/2017   Obesity (BMI 30.0-34.9) 01/25/2017   Hx of CABG 01/24/2017   Medicare annual wellness visit, subsequent 10/21/2015   Advanced care planning/counseling discussion 10/21/2015   Biceps tendinitis 10/10/2015   Coronary artery disease of native artery of native heart with stable angina pectoris ([place])    DM2 (diabetes mellitus, type 2) ([place]) 08/12/2015   History of radiation exposure    Impingement syndrome of shoulder region 05/08/2015   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   Ischemic cardiomyopathy 01/02/2015   Hyperlipidemia 01/02/2015   Essential hypertension 01/02/2015   Osteoarthritis 01/02/2015   Fuchs' corneal  dystrophy 11/02/2014   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  ONSET DATE: 08/21/2022  SUBJECTIVE:  SUBJECTIVE STATEMENT:  Pt reports improvement in L hip pain following interventions performed last session. Reports he was overjoyed regarding pain improvement felt following and reports some pain still present but catching pain in buttock was eliminated.   PERTINENT HISTORY:   Patient is a 81 year old male referral for outpatient PT services from orthopedics- complaining of right hip and back pain- history per Dr. Rudene Christians office visit note from 09/02/2022- The patient presents for evaluation of hip and back pain. He had a history of during the weekend having a Halloween walk. It was uneven and in the dark. Since then, he has been having hip pain, also slept in a recliner and has been having significant back pain. It was uneven and in the dark. Since then, he has been having hip pain.  The patient reports that he walked 3 weeks ago. He reports that when he went to the pumpkin wall, he was wearing sketchers and stepped on the trail. It was moving up and down and he was unable finish due to his pain. He states that he walked 8000 steps in 4 hours a day and was pushing cart. He walks by himself for short distances. He reports that his artificial knee here is stable and the other side it wanders. He reports gait is uneven. He  states Dr Sabra Heck told him that something was off with his hip. He reports that when he does exercise, he wears braces for hip, and it helps with his hips. He reports that he wants to try physical therapy. He reports for recreation he walks but do does not walk regularly.   Received trigger point ultrasound guided Bilateral SI joint steroid injection on 09/02/2022- Paitent reports now no pain currently in right hip but experiencing some left buttock pain over past week.   PAIN:  Are you having pain? Yes: NPRS scale: 2-3/10 Pain location: Left posterior buttock/ hamstring Pain description: Nagging ache- Intermittent Aggravating factors:   Relieving factors: standing,pushing patients   PRECAUTIONS: None  WEIGHT BEARING RESTRICTIONS: No  FALLS:  Has patient fallen in last 6 months? No  LIVING ENVIRONMENT: Lives with: lives with their spouse Lives in: House/apartment Stairs: No Has following equipment at home: None  OCCUPATION: Retired from [employer]. 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  NEXT MD VISIT: None  OBJECTIVE:   DIAGNOSTIC FINDINGS:  Nothing recent in medical notes  PATIENT SURVEYS:  Modified Oswestry 24%   SCREENING FOR RED FLAGS: Bowel or bladder incontinence: No Spinal tumors: No Cauda equina syndrome: No Compression fracture: No Abdominal aneurysm: No  COGNITION: Overall cognitive status: Within functional limits for tasks assessed     SENSATION: WFL  MUSCLE LENGTH: Hamstrings: Right 58 deg; Left 48 deg   POSTURE: No Significant postural limitations  PALPATION: + tenderness along Left proximal posterior thigh  LUMBAR ROM:   AROM eval  Flexion WNL  Extension WNL  Right lateral flexion WNL  Left lateral flexion WNL  Right rotation WNL  Left rotation WNL   (Blank rows = not tested)   LOWER EXTREMITY MMT:    MMT Right eval  Left eval  Hip flexion 5 3+  Hip extension 5 4  Hip abduction 5 4  Hip adduction 5 4  Hip internal rotation 5 5  Hip external rotation 5 5  Knee flexion 5 5  Knee extension 5 4  Ankle dorsiflexion 5  5  Ankle plantarflexion 5 5  Ankle inversion 5 5  Ankle eversion 5 5   (Blank rows = not tested)  LUMBAR SPECIAL TESTS:  Straight leg raise test: Negative, Slump test: Negative, and FABER test: Negative  FUNCTIONAL TESTS:  5 times sit to stand: 18.3 sec without UE support  GAIT: Distance walked: in clinic (approx 50 feet)  Assistive device utilized: None Level of assistance: Complete Independence Comments: Reciprocal steps   TODAY'S TREATMENT:                                                                                                                              DATE: 09/30/22  Seated HS stretch 2 x 45 seconds   Supine:  Hamstring lengthening stretch 2* 60 seconds Figure four stretch 60 seconds LE rotation 10 seconds x 10 sec    Manual:  HS palpation and Tp release with IASTM x 6 min    Sidelying:  Insertional HS ischemic TP  withr elease   Clamshells *15 each LE  Reverse clamshells 15x each LE  Prone: TP release with movement. X 3 TP in semi tendinosis and semimembranosus.  Fluoroscopy  I follow-up ASTM to hamstring musculature and to gluteal musculature.    PATIENT EDUCATION:  Education details: Purpose of PT and plan of care Person educated: Patient Education method: Explanation, Demonstration, Tactile cues, and Verbal cues Education comprehension: verbalized understanding, returned demonstration, verbal cues required, and needs further education  HOME EXERCISE PROGRAM: Access Code: [email or web address] URL: [email or web address] Date: 09/15/2022 Prepared by: Janna Arch  Exercises - Seated Piriformis Stretch  - 1 x daily - 7 x weekly - 2 sets - 2 reps - 30 hold - Seated Piriformis Stretch  - 1 x daily - 7 x weekly - 2 sets - 2 reps - 30  hold - Supine Figure 4 Piriformis Stretch  - 1 x daily - 7 x weekly - 2 sets - 2 reps - 30 hold - Hooklying Single Knee to Chest  - 1 x daily - 7 x weekly - 2 sets - 10 reps - 5 hold -Hamstring stretch in seated and supine position 2 x 30 second holds  ASSESSMENT:  CLINICAL IMPRESSION: Pt presents with excellent motivation for completion of PT activities. Pt responds very well to the last session with target of hamstring musculature for pain relief.  Patient reports his pain has significantly improved in his hamstring since physical therapy but does note continued minimal amounts of pain in this area.  Patient reports no low back pain at this time.  If patient continues to demonstrate continued improvement in pain we will progress with therapeutic exercises for core strengthening and stability next session with continued monitoring of insertional hamstring and low back pain and discomfort.  Pt will continue to benefit from skilled physical therapy intervention to address impairments, improve QOL, and attain therapy goals.    OBJECTIVE IMPAIRMENTS: Abnormal gait, decreased activity tolerance, decreased balance, decreased mobility,  difficulty walking, impaired perceived functional ability, impaired flexibility, and pain.   ACTIVITY LIMITATIONS: sitting  PARTICIPATION LIMITATIONS: occupation  PERSONAL FACTORS: 1 comorbidity: Hypertension  are also affecting patient's functional outcome.   REHAB POTENTIAL: Good  CLINICAL DECISION MAKING: Evolving/moderate complexity  EVALUATION COMPLEXITY: Low   GOALS: Goals reviewed with patient? Yes  SHORT TERM GOALS: Target date: 11/11/2022  Pt will be independent with HEP in order to improve strength and decrease back pain in order to improve pain-free function at home and work.  Baseline: EVAL= Patient has no formal HEP in place Goal status: INITIAL   LONG TERM GOALS: Target date: 12/23/2022  Pt will improve FOTO to target score of 68 to display  perceived improvements in ability to complete ADL's.  Baseline: EVAL = 56 Goal status: INITIAL  2.  Pt will decrease mODI score by at least 13 points in order demonstrate clinically significant reduction in back pain/disability.       Baseline: EVAL= 24 Goal status: INITIAL  3.  Pt will decrease worst back pain as reported on NPRS by at least 2 points in order to demonstrate clinically significant reduction in back pain.  Baseline: EVAL=3/10 Goal status: INITIAL  4.  Pt will decrease 5TSTS by at least 3 seconds in order to demonstrate clinically significant improvement in LE strength.  Baseline: EVAL= 18.88 sec  Goal status: INITIAL    PLAN:  PT FREQUENCY: 1-2x/week  PT DURATION: 12 weeks  PLANNED INTERVENTIONS: Therapeutic exercises, Therapeutic activity, Neuromuscular re-education, Balance training, Gait training, Patient/Family education, Self Care, Joint mobilization, Joint manipulation, Dry Needling, Electrical stimulation, Spinal manipulation, Spinal mobilization, Cryotherapy, Moist heat, and Manual therapy.  PLAN FOR NEXT SESSION: Manual Therapy for pain relief, strengthening of core and core muscle activation if pain levels remain appropriate for this.   Particia Lather, PT 09/30/2022, 3:07 PM

## 2022-10-01 ENCOUNTER — Ambulatory Visit: Payer: Medicare Other | Admitting: Physical Therapy

## 2022-10-01 DIAGNOSIS — M25552 Pain in left hip: Secondary | ICD-10-CM

## 2022-10-01 DIAGNOSIS — M6281 Muscle weakness (generalized): Secondary | ICD-10-CM

## 2022-10-01 DIAGNOSIS — R269 Unspecified abnormalities of gait and mobility: Secondary | ICD-10-CM | POA: Diagnosis not present

## 2022-10-06 ENCOUNTER — Ambulatory Visit: Payer: Medicare Other | Attending: Orthopedic Surgery | Admitting: Physical Therapy

## 2022-10-06 DIAGNOSIS — R269 Unspecified abnormalities of gait and mobility: Secondary | ICD-10-CM | POA: Insufficient documentation

## 2022-10-06 DIAGNOSIS — M545 Low back pain, unspecified: Secondary | ICD-10-CM | POA: Diagnosis present

## 2022-10-06 DIAGNOSIS — M25552 Pain in left hip: Secondary | ICD-10-CM | POA: Insufficient documentation

## 2022-10-06 DIAGNOSIS — M6281 Muscle weakness (generalized): Secondary | ICD-10-CM | POA: Diagnosis present

## 2022-10-06 NOTE — Therapy (Signed)
OUTPATIENT PHYSICAL THERAPY THORACOLUMBAR TREATMENT   Patient Name: Shannon Chung MRN: [id number] DOB:[birth year], 81 y.o., male Today's Date: 10/06/2022   PT End of Session - 10/06/22 1430     Visit Number 5    Number of Visits 12    Date for PT Re-Evaluation 12/07/22    Progress Note Due on Visit 10    PT Start Time 3893    PT Stop Time 1512    PT Time Calculation (min) 41 min    Equipment Utilized During Treatment Back brace    Activity Tolerance Patient tolerated treatment well;No increased pain    Behavior During Therapy WFL for tasks assessed/performed                Past Medical History:  Diagnosis Date   Coronary artery disease    a. 06/2015 Cardiac CT: Ca score 1103 (84th %'ile);  b. 07/2015 Cath: LM 70, LAD 80p, 100/86m D1 70, D2 95, RI 75, RCA 100p/m;  c. 07/2015 CABG x 5 (LIMA->LAD, VG->Diag, VG->OM1->OM2, VG->OM3).   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 dilated, EF 35% improved to 45-50% (2015);  b. 07/2015 EF 25-35% by LV gram.   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 isolated episode, not on [place]   [place] thrombus of cardiac apex    a. 06/2014: LV; resolved with coumadin-->no residual on f/u echo, no longer on coumadin.   Osteoarthritis    a. R-shoulder, L-knee ([place] Heck[place]o)   Skin cancer    squamous and basal cell right forearm, SCC left cheek 10/04/20 sees derm regularly Dr. 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   XRT; right vocal cord; had f[place] until 2013 or 2015 [place]   Stroke ([facility]    Thrombocytopenia ([place]    Vitamin D deficiency    Past Surgical History:  Procedure Laterality Date   BICEPS TENDON REPAIR Right [place]/A 07/05/2015   Procedure: Left Heart Cath and Coronary Angiography;  Surgeon: TMinna Merritts MD;  [facility];  Service: Cardiovascular;  Laterality: N/A;   CATARACT EXTRACTION Left 12/2016   with keratoplasty   COLONOSCOPY  2007   COLONOSCOPY WITH PROPOFOL N/A 12/02/2017   TA, SSA, rpt 3 yrs(Tahiliani, Varnita B, MD)   COLONOSCOPY WITH PROPOFOL N/A 11/26/2020   Procedure: COLONOSCOPY WITH PROPOFOL;  Surgeon: WLucilla Lame MD;  [facility];  Service: Endoscopy;  Laterality: N/A;   CORONARY ARTERY BYPASS GRAFT N/A 07/29/2015   Procedure: CORONARY ARTERY BYPASS GRAFTING (CABG) x 5 (LIMA to LAD, SVG to DIAGONAL,  SVG SEQUENTIALLY to OM1 and OM2, SVG to OM3) with Endoscopic Vein Havesting of  GREATER SAPHENOUS VEIN from RIGHT THIGH and partial LOWER LEG ;  Surgeon: BGaye Pollack MD;  Location: [facility];  Service: Open Heart Surgery;  Laterality: N/A;   EYE SURGERY     b/l cataract and cornea replaced    HAND SURGERY     left hand 1st/2nd trigger fingers Dr. Sabra Heck ortho    JOINT REPLACEMENT     KNEE ARTHROSCOPY Left remote   MOHS SURGERY     left cheek scc 2022 Dr. Winifred Olive   MOHS SURGERY     x 5 facial scc   right biceps tendon     repair/re attachment    SKIN CANCER EXCISION  10/2015   BCC - L ala (pending MOHs) and L scapula (complete excision)   TEE WITHOUT CARDIOVERSION N/A 07/29/2015   Procedure: TRANSESOPHAGEAL ECHOCARDIOGRAM (TEE);  Surgeon: [name], MD;  Location: [place];  Service: Open Heart Surgery;  Laterality: N/A;   TONSILLECTOMY  1949   TOTAL KNEE ARTHROPLASTY Left 03/18/2016   cemented L TKR; Earnestine Leys, MD   Patient Active Problem List   Diagnosis Date  Noted   Cerebrovascular accident (CVA) ([place]) 03/11/2022   Cervical spondylosis 03/11/2022   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   Bilateral carotid artery stenosis 03/11/2022   Retention cyst of paranasal sinus 03/11/2022   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   Lumbar spondylosis 10/07/2021   Malignant neoplasm of prostate ([place]) 10/07/2021   DDD (degenerative disc disease), lumbar 10/07/2021   Kidney stones 04/17/2021   Aortic atherosclerosis ([place]) 04/17/2021   Diverticulosis 04/17/2021   Personal history of colonic polyps    Polyp of colon    Grief 10/07/2020   Adjustment reaction with anxiety and depression 10/07/2020   Hypertension associated with diabetes ([place]) 10/07/2020   Overweight (BMI 25.0-29.9) 10/07/2020   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   Vitamin D deficiency 08/01/2020   Polyp of sigmoid colon 08/01/2020   Anxiety 06/07/2020   Insomnia 06/07/2020   Gastroesophageal reflux disease 04/04/2020   Degenerative joint disease of hand 03/01/2020   Strain of muscle of right hip 08/28/2019   Trigger finger of left hand 07/07/2019   BPH (benign prostatic hyperplasia) 08/05/2018   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   Macular scar of both eyes 04/20/2018   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   Reduced visual acuity 04/20/2018   Scotoma involving central area of both eyes 04/20/2018   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/25/2018   Fatty liver 03/25/2018   PSA elevation 03/25/2018   Thrombocytopenia ([place]) 02/14/2018   Frequent PVCs 02/14/2018   Positive colorectal cancer screening using Cologuard test    Benign neoplasm of cecum    Benign neoplasm of transverse colon    Health maintenance examination 02/23/2017   Erectile dysfunction 02/23/2017   Obesity (BMI 30.0-34.9) 01/25/2017   Hx of CABG 01/24/2017   Medicare annual wellness visit, subsequent 10/21/2015   Advanced care planning/counseling discussion 10/21/2015   Biceps tendinitis 10/10/2015   Coronary  artery disease of native artery of native heart with stable angina pectoris ([place])    DM2 (diabetes mellitus, type 2) ([place]) 08/12/2015   History of radiation exposure    Impingement syndrome of shoulder region 05/08/2015   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   Ischemic cardiomyopathy 01/02/2015   Hyperlipidemia 01/02/2015   Essential hypertension 01/02/2015   Osteoarthritis 01/02/2015   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/02/2014   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   Squamous cell carcinoma of vocal cord ([place]) 2008    PCP: Orland Mustard, MD  REFERRING PROVIDER: Hessie Knows, MD  REFERRING DIAG:  [phone] (ICD-10-CM) - Spondylosis without myelopathy or  radiculopathy, sacral and sacrococcygeal region  M41.57 (ICD-10-CM) - Other secondary scoliosis, lumbosacral region    Rationale for Evaluation and Treatment: Rehabilitation  THERAPY DIAG:  Muscle weakness (generalized)  Pain in left hip  Abnormality of gait and mobility  ONSET DATE: 08/21/2022  SUBJECTIVE:                                                                                                                                                                                           SUBJECTIVE STATEMENT:  Pt reports having pain following work on Friday because he was very busy. 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   PERTINENT HISTORY:   Patient is a 80 year old male referral for outpatient PT services from orthopedics- complaining of right hip and back pain- history per Dr. Rudene Christians office visit note from 09/02/2022- The patient presents for evaluation of hip and back pain. He had a history of during the weekend having a Halloween walk. It was uneven and in the dark. Since then, he has been having hip pain, also slept in a recliner and has been having significant back pain. It was uneven and in the dark. Since then, he has been having hip  pain.  The patient reports that he walked 3 weeks ago. He reports that when he went to the pumpkin [place], he was wearing sketchers and stepped on the trail. It was moving up and down and he was unable finish due to his pain. He states that he walked 8000 steps in 4 hours a day and was pushing cart. He walks by himself for short distances. He reports that his artificial knee here is stable and the other side it wanders. He reports gait is uneven. He states Dr Sabra Heck told him that something was off with his hip. He reports that when he does exercise, he wears braces for hip, and it helps with his hips. He reports that he wants to try physical therapy. He reports for recreation he walks but do does not walk regularly.   Received trigger point ultrasound guided Bilateral SI joint steroid injection on 09/02/2022- Paitent reports now no pain currently in right hip but experiencing some left buttock pain over past week.   PAIN:  Are you having pain? Yes: NPRS scale: 2-3/10 Pain location: Left posterior buttock/ hamstring Pain description: Nagging ache- Intermittent Aggravating factors: walking and pushing patients.  Relieving factors: standing,pushing patients   PRECAUTIONS: None  WEIGHT BEARING RESTRICTIONS: No  FALLS:  Has patient fallen in last 6 months? No  LIVING  ENVIRONMENT: Lives with: lives with their spouse Lives in: House/apartment Stairs: No Has following equipment at home: None  OCCUPATION: Retired from [employer]. 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  NEXT MD VISIT: None  OBJECTIVE:   DIAGNOSTIC FINDINGS:  Nothing recent in medical notes  PATIENT SURVEYS:  Modified Oswestry 24%   SCREENING FOR RED FLAGS: Bowel or bladder incontinence: No Spinal tumors: No Cauda equina syndrome: No Compression fracture: No Abdominal aneurysm: No  COGNITION: Overall  cognitive status: Within functional limits for tasks assessed     SENSATION: WFL  MUSCLE LENGTH: Hamstrings: Right 58 deg; Left 48 deg   POSTURE: No Significant postural limitations  PALPATION: + tenderness along Left proximal posterior thigh  LUMBAR ROM:   AROM eval  Flexion WNL  Extension WNL  Right lateral flexion WNL  Left lateral flexion WNL  Right rotation WNL  Left rotation WNL   (Blank rows = not tested)   LOWER EXTREMITY MMT:    MMT Right eval Left eval  Hip flexion 5 3+  Hip extension 5 4  Hip abduction 5 4  Hip adduction 5 4  Hip internal rotation 5 5  Hip external rotation 5 5  Knee flexion 5 5  Knee extension 5 4  Ankle dorsiflexion 5 5  Ankle plantarflexion 5 5  Ankle inversion 5 5  Ankle eversion 5 5   (Blank rows = not tested)  LUMBAR SPECIAL TESTS:  Straight leg raise test: Negative, Slump test: Negative, and FABER test: Negative  FUNCTIONAL TESTS:  5 times sit to stand: 18.3 sec without UE support  GAIT: Distance walked: in clinic (approx 50 feet)  Assistive device utilized: None Level of assistance: Complete Independence Comments: Reciprocal steps   TODAY'S TREATMENT:                                                                                                                              DATE: 10/06/22  Supine:  Hamstring lengthening stretch 2* 60 seconds Figure four stretch 60 seconds LE rotation 10 seconds x 10 sec  holds Heel slides 2 x 10 to activate hamstring musculature.  Toe elevated bridge   Manual:  HS palpation and Tp release with IASTM x 6 min in supine    Sidelying:   Insertional HS ischemic TP  with release   Clamshells 2*12 each LE with RTB for resistance   Reverse clamshells 10x each LE with YTB for resistance    PATIENT EDUCATION:  Education details: Purpose of PT and plan of care Person educated: Patient Education method: Explanation, Demonstration, Tactile cues, and Verbal cues Education  comprehension: verbalized understanding, returned demonstration, verbal cues required, and needs further education  HOME EXERCISE PROGRAM: Access Code: [email or web address] URL: [email or web address] Date: 09/15/2022 Prepared by: Janna Arch  Exercises - Seated Piriformis Stretch  - 1 x daily - 7  x weekly - 2 sets - 2 reps - 30 hold - Seated Piriformis Stretch  - 1 x daily - 7 x weekly - 2 sets - 2 reps - 30 hold - Supine Figure 4 Piriformis Stretch  - 1 x daily - 7 x weekly - 2 sets - 2 reps - 30 hold - Hooklying Single Knee to Chest  - 1 x daily - 7 x weekly - 2 sets - 10 reps - 5 hold -Hamstring stretch in seated and supine position 2 x 30 second holds  ASSESSMENT:  CLINICAL IMPRESSION: Pt presents with excellent motivation for completion of PT activities. Pt responds very well to the last session with target of hamstring musculature for pain relief.  Patient reports his pain has significantly improved in his hamstring since physical therapy but does note continued minimal amounts of pain in this area.  Patient reports no low back pain at this time.  If patient continues to demonstrate continued improvement in pain we will progress with therapeutic exercises for core strengthening and stability next session with continued monitoring of insertional hamstring and low back pain and discomfort.  Pt will continue to benefit from skilled physical therapy intervention to address impairments, improve QOL, and attain therapy goals.    OBJECTIVE IMPAIRMENTS: Abnormal gait, decreased activity tolerance, decreased balance, decreased mobility, difficulty walking, impaired perceived functional ability, impaired flexibility, and pain.   ACTIVITY LIMITATIONS: sitting  PARTICIPATION LIMITATIONS: occupation  PERSONAL FACTORS: 1 comorbidity: Hypertension  are also affecting patient's functional outcome.   REHAB POTENTIAL: Good  CLINICAL DECISION MAKING: Evolving/moderate complexity  EVALUATION  COMPLEXITY: Low   GOALS: Goals reviewed with patient? Yes  SHORT TERM GOALS: Target date: 11/17/2022  Pt will be independent with HEP in order to improve strength and decrease back pain in order to improve pain-free function at home and work.  Baseline: EVAL= Patient has no formal HEP in place Goal status: INITIAL   LONG TERM GOALS: Target date: 12/29/2022  Pt will improve FOTO to target score of 68 to display perceived improvements in ability to complete ADL's.  Baseline: EVAL = 56 Goal status: INITIAL  2.  Pt will decrease mODI score by at least 13 points in order demonstrate clinically significant reduction in back pain/disability.       Baseline: EVAL= 24 Goal status: INITIAL  3.  Pt will decrease worst back pain as reported on NPRS by at least 2 points in order to demonstrate clinically significant reduction in back pain.  Baseline: EVAL=3/10 Goal status: INITIAL  4.  Pt will decrease 5TSTS by at least 3 seconds in order to demonstrate clinically significant improvement in LE strength.  Baseline: EVAL= 18.88 sec  Goal status: INITIAL    PLAN:  PT FREQUENCY: 1-2x/week  PT DURATION: 12 weeks  PLANNED INTERVENTIONS: Therapeutic exercises, Therapeutic activity, Neuromuscular re-education, Balance training, Gait training, Patient/Family education, Self Care, Joint mobilization, Joint manipulation, Dry Needling, Electrical stimulation, Spinal manipulation, Spinal mobilization, Cryotherapy, Moist heat, and Manual therapy.  PLAN FOR NEXT SESSION: Manual Therapy for pain relief, strengthening of hips and progressive hamstring strengthening including donkey kicks as pt tolerates.   Particia Lather, PT 10/06/2022, 3:23 PM

## 2022-10-08 ENCOUNTER — Ambulatory Visit: Payer: Medicare Other

## 2022-10-08 DIAGNOSIS — M6281 Muscle weakness (generalized): Secondary | ICD-10-CM | POA: Diagnosis not present

## 2022-10-08 DIAGNOSIS — M25552 Pain in left hip: Secondary | ICD-10-CM

## 2022-10-08 DIAGNOSIS — R269 Unspecified abnormalities of gait and mobility: Secondary | ICD-10-CM

## 2022-10-08 DIAGNOSIS — M545 Low back pain, unspecified: Secondary | ICD-10-CM

## 2022-10-08 NOTE — Therapy (Signed)
OUTPATIENT PHYSICAL THERAPY THORACOLUMBAR TREATMENT   Patient Name: Shannon Chung MRN: [id number] DOB:1941/05/23, 81 y.o., male Today's Date: 10/09/2022   PT End of Session - 10/08/22 1146     Visit Number 6    Number of Visits 12    Date for PT Re-Evaluation 12/07/22    Progress Note Due on Visit 10    PT Start Time 1145    PT Stop Time 1216    PT Time Calculation (min) 31 min    Equipment Utilized During Treatment Back brace    Activity Tolerance Patient tolerated treatment well;No increased pain    Behavior During Therapy WFL for tasks assessed/performed                Past Medical History:  Diagnosis Date   Coronary artery disease    a. 06/2015 Cardiac CT: Ca score 1103 (84th %'ile);  b. 07/2015 Cath: LM 70, LAD 80p, 100/77m D1 70, D2 95, RI 75, RCA 100p/m;  c. 07/2015 CABG x 5 (LIMA->LAD, VG->Diag, VG->OM1->OM2, VG->OM3).   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 dilated, EF 35% improved to 45-50% (2015);  b. 07/2015 EF 25-35% by LV gram.   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 isolated episode, not on OHunt   [place]ural thrombus of cardiac apex    a. 06/2014: LV; resolved with coumadin-->no residual on f/u echo, no longer on coumadin.   Osteoarthritis    a. R-shoulder, L-knee ([place]abra Heckortho)   Skin cancer    squamous and basal cell right forearm, SCC left cheek 10/04/20 sees derm regularly Dr. 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    [place]rombocytopenia ([place]    Vitamin D deficiency    Past Surgical History:  Procedure Laterality Date   BICEPS TENDON REPAIR Right [place] 07/05/2015   Procedure: Left Heart Cath and Coronary Angiography;  Surgeon: TMinna Merritts MD;  [facility];  Service: Cardiovascular;  Laterality: N/A;   CATARACT EXTRACTION Left 12/2016   with keratoplasty   COLONOSCOPY  2007   COLONOSCOPY WITH PROPOFOL N/A 12/02/2017   TA, SSA, rpt 3 yrs(Tahiliani, Varnita B, MD)   COLONOSCOPY WITH PROPOFOL N/A 11/26/2020   Procedure: COLONOSCOPY WITH PROPOFOL;  Surgeon: WLucilla Lame MD;  [facility];  Service: Endoscopy;  Laterality: N/A;   CORONARY ARTERY BYPASS GRAFT N/A 07/29/2015   Procedure: CORONARY ARTERY BYPASS GRAFTING (CABG) x 5 (LIMA to LAD, SVG to DIAGONAL,  SVG SEQUENTIALLY to OM1 and OM2, SVG to OM3) with Endoscopic Vein Havesting of  GREATER SAPHENOUS VEIN from RIGHT THIGH and partial LOWER LEG ;  Surgeon: BGaye Pollack MD;  Location: [facility] OR;  Service: Open Heart Surgery;  Laterality: N/A;   EYE SURGERY     b/l cataract and cornea replaced    HAND SURGERY     left hand 1st/2nd trigger fingers Dr. Sabra Heck ortho    JOINT REPLACEMENT     KNEE ARTHROSCOPY Left remote   MOHS SURGERY     left cheek scc 2022 Dr. Winifred Olive   MOHS SURGERY     x 5 facial scc   right biceps tendon     repair/re attachment    SKIN CANCER EXCISION  10/2015   BCC - L ala (pending MOHs) and L scapula (complete excision)   TEE WITHOUT CARDIOVERSION N/A 07/29/2015   Procedure: TRANSESOPHAGEAL ECHOCARDIOGRAM (TEE);  Surgeon: [name], MD;  Location: [place];  Service: Open Heart Surgery;  Laterality: N/A;   TONSILLECTOMY  1949   TOTAL KNEE ARTHROPLASTY Left 03/18/2016   cemented L TKR; Earnestine Leys, MD   Patient Active Problem List   Diagnosis Date  Noted   Cerebrovascular accident (CVA) ([place]) 03/11/2022   Cervical spondylosis 03/11/2022   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   Bilateral carotid artery stenosis 03/11/2022   Retention cyst of paranasal sinus 03/11/2022   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   Lumbar spondylosis 10/07/2021   Malignant neoplasm of prostate ([place]) 10/07/2021   DDD (degenerative disc disease), lumbar 10/07/2021   Kidney stones 04/17/2021   Aortic atherosclerosis ([place]) 04/17/2021   Diverticulosis 04/17/2021   Personal history of colonic polyps    Polyp of colon    Grief 10/07/2020   Adjustment reaction with anxiety and depression 10/07/2020   Hypertension associated with diabetes ([place]) 10/07/2020   Overweight (BMI 25.0-29.9) 10/07/2020   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   Vitamin D deficiency 08/01/2020   Polyp of sigmoid colon 08/01/2020   Anxiety 06/07/2020   Insomnia 06/07/2020   Gastroesophageal reflux disease 04/04/2020   Degenerative joint disease of hand 03/01/2020   Strain of muscle of right hip 08/28/2019   Trigger finger of left hand 07/07/2019   BPH (benign prostatic hyperplasia) 08/05/2018   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   Macular scar of both eyes 04/20/2018   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   Reduced visual acuity 04/20/2018   Scotoma involving central area of both eyes 04/20/2018   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/25/2018   Fatty liver 03/25/2018   PSA elevation 03/25/2018   Thrombocytopenia ([place]) 02/14/2018   Frequent PVCs 02/14/2018   Positive colorectal cancer screening using Cologuard test    Benign neoplasm of cecum    Benign neoplasm of transverse colon    Health maintenance examination 02/23/2017   Erectile dysfunction 02/23/2017   Obesity (BMI 30.0-34.9) 01/25/2017   Hx of CABG 01/24/2017   Medicare annual wellness visit, subsequent 10/21/2015   Advanced care planning/counseling discussion 10/21/2015   Biceps tendinitis 10/10/2015   Coronary  artery disease of native artery of native heart with stable angina pectoris ([place])    DM2 (diabetes mellitus, type 2) ([place]) 08/12/2015   History of radiation exposure    Impingement syndrome of shoulder region 05/08/2015   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   Ischemic cardiomyopathy 01/02/2015   Hyperlipidemia 01/02/2015   Essential hypertension 01/02/2015   Osteoarthritis 01/02/2015   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/02/2014   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   Squamous cell carcinoma of vocal cord ([place]) 2008    PCP: Orland Mustard, MD  REFERRING PROVIDER: Hessie Knows, MD  REFERRING DIAG:  [id number] (ICD-10-CM) - Spondylosis without myelopathy or  radiculopathy, sacral and sacrococcygeal region  M41.57 (ICD-10-CM) - Other secondary scoliosis, lumbosacral region    Rationale for Evaluation and Treatment: Rehabilitation  THERAPY DIAG:  Muscle weakness (generalized)  Pain in left hip  Abnormality of gait and mobility  Bilateral low back pain without sciatica, unspecified chronicity  ONSET DATE: 08/21/2022  SUBJECTIVE:                                                                                                                                                                                           SUBJECTIVE STATEMENT:  Pt reports having ongoing left hamstring pain but overall doing better. He states no further sacroiliac or lumbar pain at this time. Reports compliant with stretching techniques for hamstrings.  PERTINENT HISTORY:   Patient is a 81 year old male referral for outpatient PT services from orthopedics- complaining of right hip and back pain- history per Dr. Rudene Christians office visit note from 09/02/2022- The patient presents for evaluation of hip and back pain. He had a history of during the weekend having a Halloween walk. It was uneven and in the dark. Since then, he has been having hip pain, also slept in a recliner and has been having significant back pain. It was  uneven and in the dark. Since then, he has been having hip pain.  The patient reports that he walked 3 weeks ago. He reports that when he [place] the pumpkin [place], he was wearing sketchers and stepped on the trail. It was moving up and down and he was unable finish due to his pain. He states that he walked 8000 steps in 4 hours a day and was pushing cart. He walks by himself for short distances. He reports that his artificial knee here is stable and the other side it wanders. He reports gait is uneven. He states Dr Sabra Heck told him that something was off with his hip. He reports that when he does exercise, he wears braces for hip, and it helps with his hips. He reports that he wants to try physical therapy. He reports for recreation he walks but do does not walk regularly.   Received trigger point ultrasound guided Bilateral SI joint steroid injection on 09/02/2022- Paitent reports now no pain currently in right hip but experiencing some left buttock pain over past week.   PAIN:  Are you having pain? Yes: NPRS scale: 2-3/10 Pain location: Left posterior buttock/ hamstring Pain description: Nagging ache- Intermittent Aggravating factors: walking and pushing patients.  Relieving factors: standing,pushing patients   PRECAUTIONS: None  WEIGHT BEARING RESTRICTIONS: No  FALLS:  Has patient fallen in last 6 months? No  LIVING ENVIRONMENT: Lives with: lives with their spouse Lives in: House/apartment Stairs: No Has following equipment at home: None  OCCUPATION: Retired from [employer]. 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  NEXT MD VISIT: None  OBJECTIVE:   DIAGNOSTIC FINDINGS:  Nothing recent in medical notes  PATIENT SURVEYS:  Modified Oswestry 24%   SCREENING FOR RED FLAGS: Bowel or bladder incontinence: No Spinal tumors: No Cauda equina syndrome: No Compression  fracture: No Abdominal aneurysm: No  COGNITION: Overall cognitive status: Within functional limits for tasks assessed     SENSATION: WFL  MUSCLE LENGTH: Hamstrings: Right 58 deg; Left 48 deg   POSTURE: No Significant postural limitations  PALPATION: + tenderness along Left proximal posterior thigh  LUMBAR ROM:   AROM eval  Flexion WNL  Extension WNL  Right lateral flexion WNL  Left lateral flexion WNL  Right rotation WNL  Left rotation WNL   (Blank rows = not tested)   LOWER EXTREMITY MMT:    MMT Right eval Left eval  Hip flexion 5 3+  Hip extension 5 4  Hip abduction 5 4  Hip adduction 5 4  Hip internal rotation 5 5  Hip external rotation 5 5  Knee flexion 5 5  Knee extension 5 4  Ankle dorsiflexion 5 5  Ankle plantarflexion 5 5  Ankle inversion 5 5  Ankle eversion 5 5   (Blank rows = not tested)  LUMBAR SPECIAL TESTS:  Straight leg raise test: Negative, Slump test: Negative, and FABER test: Negative  FUNCTIONAL TESTS:  5 times sit to stand: 18.3 sec without UE support  GAIT: Distance walked: in clinic (approx 50 feet)  Assistive device utilized: None Level of assistance: Complete Independence Comments: Reciprocal steps   TODAY'S TREATMENT:                                                                                                                              DATE: 10/08/22  Supine: Left LE Hamstring lengthening stretch 3 sets of  60 sec hold (measured at 60deg left)  Figure four stretch 60 seconds x 2 sets Supine ham set (hold 5 sec) x 10 reps Active heel slide (pain free) supine and later seated (pain free) x 10 reps   Manual: Prone Left LE HS palpation and Tp release with IASTM x 6 min in supine  Manual rolling stick to posterior hamstring region x 5 min      PATIENT EDUCATION:  Education details: Purpose of PT and plan of care Person educated: Patient Education method: Explanation, Demonstration, Tactile cues, and Verbal  cues Education comprehension: verbalized understanding, returned demonstration, verbal cues required, and needs further education  HOME EXERCISE PROGRAM: Access Code: [email or web address] URL: [email or web address] Date: 10/08/2022 Prepared by: Sande Brothers  Exercises - Supine Isometric Hamstring Set  - 1 x daily - 3 x weekly -  2-3 sets - 10 reps - 5 hold - Seated Hamstring Set  - 3 x weekly - 3 sets - 10 reps - 5 hold - Seated Heel Slide  - 3 x weekly - 3 sets - 10 reps - Prone Knee Flexion  - 3 x weekly - 3 sets - 10 reps     Access Code: [email or web address] URL: [email or web address] Date: 09/15/2022 Prepared by: Janna Arch  Exercises - Seated Piriformis Stretch  - 1 x daily - 7 x weekly - 2 sets - 2 reps - 30 hold - Seated Piriformis Stretch  - 1 x daily - 7 x weekly - 2 sets - 2 reps - 30 hold - Supine Figure 4 Piriformis Stretch  - 1 x daily - 7 x weekly - 2 sets - 2 reps - 30 hold - Hooklying Single Knee to Chest  - 1 x daily - 7 x weekly - 2 sets - 10 reps - 5 hold -Hamstring stretch in seated and supine position 2 x 30 second holds    ASSESSMENT:  CLINICAL IMPRESSION: Treatment focused again on area of pain- Left hamstring as patient continues to deny and SI or lumbar pain. He presented with limited Hamstring length pre-treatment approx 60 deg with point specific pain in mid- central hamstring region. He responded well again to manual techniques and no pain with isometric and gentle pain-free active ROM at end of session.  Pt will continue to benefit from skilled physical therapy intervention to address impairments, improve QOL, and attain therapy goals.    OBJECTIVE IMPAIRMENTS: Abnormal gait, decreased activity tolerance, decreased balance, decreased mobility, difficulty walking, impaired perceived functional ability, impaired flexibility, and pain.   ACTIVITY LIMITATIONS: sitting  PARTICIPATION LIMITATIONS: occupation  PERSONAL FACTORS: 1  comorbidity: Hypertension  are also affecting patient's functional outcome.   REHAB POTENTIAL: Good  CLINICAL DECISION MAKING: Evolving/moderate complexity  EVALUATION COMPLEXITY: Low   GOALS: Goals reviewed with patient? Yes  SHORT TERM GOALS: Target date: 11/20/2022  Pt will be independent with HEP in order to improve strength and decrease back pain in order to improve pain-free function at home and work.  Baseline: EVAL= Patient has no formal HEP in place Goal status: INITIAL   LONG TERM GOALS: Target date: 01/01/2023  Pt will improve FOTO to target score of 68 to display perceived improvements in ability to complete ADL's.  Baseline: EVAL = 56 Goal status: INITIAL  2.  Pt will decrease mODI score by at least 13 points in order demonstrate clinically significant reduction in back pain/disability.       Baseline: EVAL= 24 Goal status: INITIAL  3.  Pt will decrease worst back pain as reported on NPRS by at least 2 points in order to demonstrate clinically significant reduction in back pain.  Baseline: EVAL=3/10 Goal status: INITIAL  4.  Pt will decrease 5TSTS by at least 3 seconds in order to demonstrate clinically significant improvement in LE strength.  Baseline: EVAL= 18.88 sec  Goal status: INITIAL    PLAN:  PT FREQUENCY: 1-2x/week  PT DURATION: 12 weeks  PLANNED INTERVENTIONS: Therapeutic exercises, Therapeutic activity, Neuromuscular re-education, Balance training, Gait training, Patient/Family education, Self Care, Joint mobilization, Joint manipulation, Dry Needling, Electrical stimulation, Spinal manipulation, Spinal mobilization, Cryotherapy, Moist heat, and Manual therapy.  PLAN FOR NEXT SESSION: Manual Therapy for pain relief, strengthening of hips and progressive hamstring strengthening including donkey kicks as pt tolerates.   Lewis Moccasin, PT 10/09/2022, 11:17 AM

## 2022-10-15 ENCOUNTER — Ambulatory Visit: Payer: Medicare Other

## 2022-10-15 DIAGNOSIS — M25552 Pain in left hip: Secondary | ICD-10-CM

## 2022-10-15 DIAGNOSIS — M545 Low back pain, unspecified: Secondary | ICD-10-CM

## 2022-10-15 DIAGNOSIS — R269 Unspecified abnormalities of gait and mobility: Secondary | ICD-10-CM

## 2022-10-15 DIAGNOSIS — M6281 Muscle weakness (generalized): Secondary | ICD-10-CM

## 2022-10-15 NOTE — Therapy (Signed)
OUTPATIENT PHYSICAL THERAPY THORACOLUMBAR TREATMENT   Patient Name: Shannon Chung MRN: [id number] DOB:[birth year], 81 y.o., male Today's Date: 10/16/2022   PT End of Session - 10/15/22 1600     Visit Number 7    Number of Visits 12    Date for PT Re-Evaluation 12/07/22    Progress Note Due on Visit 10    PT Start Time 1600    PT Stop Time 1635    PT Time Calculation (min) 35 min    Equipment Utilized During Treatment Back brace    Activity Tolerance Patient tolerated treatment well;No increased pain    Behavior During Therapy WFL for tasks assessed/performed                Past Medical History:  Diagnosis Date   Coronary artery disease    a. 06/2015 Cardiac CT: Ca score 1103 (84th %'ile);  b. 07/2015 Cath: LM 70, LAD 80p, 100/72m D1 70, D2 95, RI 75, RCA 100p/m;  c. 07/2015 CABG x 5 (LIMA->LAD, VG->Diag, VG->OM1->OM2, VG->OM3).   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 dilated, EF 35% improved to 45-50% (2015);  b. 07/2015 EF 25-35% by LV gram.   [place]one atrial fibrillation ([place]   a. isolated episode, not on [place]   [place] thrombus of cardiac apex    a. 06/2014: LV; resolved with coumadin-->no residual on f/u echo, no longer on coumadin.   Osteoarthritis    a. R-shoulder, L-knee (Sabra Heckortho)   Skin cancer    squamous and basal cell right forearm, SCC left cheek 10/04/20 sees derm regularly Dr. 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   XRT; right vocal cord; [place] 2013 [place]   [facility]    Thrombocytopenia ([place]    Vitamin D deficiency    Past Surgical History:  Procedure Laterality Date   BICEPS TENDON REPAIR Right 1RhodesN/A 07/05/2015   Procedure: Left Heart Cath and Coronary Angiography;  Surgeon: TMinna Merritts MD;  [facility];  Service: Cardiovascular;  Laterality: N/A;   CATARACT EXTRACTION Left 12/2016   with keratoplasty   COLONOSCOPY  2007   COLONOSCOPY WITH PROPOFOL N/A 12/02/2017   TA, SSA, rpt 3 yrs(Tahiliani, Varnita B, MD)   COLONOSCOPY WITH PROPOFOL N/A 11/26/2020   Procedure: COLONOSCOPY WITH PROPOFOL;  Surgeon: WLucilla Lame MD;  [facility];  Service: Endoscopy;  Laterality: N/A;   CORONARY ARTERY BYPASS GRAFT N/A 07/29/2015   Procedure: CORONARY ARTERY BYPASS GRAFTING (CABG) x 5 (LIMA to LAD, SVG to DIAGONAL,  SVG SEQUENTIALLY to OM1 and OM2, SVG to OM3) with Endoscopic Vein Havesting of  GREATER SAPHENOUS VEIN from RIGHT THIGH and partial LOWER LEG ;  Surgeon: BGaye Pollack MD;  Location: [facility];  Service: Open Heart Surgery;  Laterality: N/A;   EYE SURGERY     b/l cataract and cornea replaced    HAND SURGERY     left hand 1st/2nd trigger fingers Dr. Sabra Heck ortho    JOINT REPLACEMENT     KNEE ARTHROSCOPY Left remote   MOHS SURGERY     left cheek scc 2022 Dr. Winifred Olive   MOHS SURGERY     x 5 facial scc   right biceps tendon     repair/re attachment    SKIN CANCER EXCISION  10/2015   BCC - L ala (pending MOHs) and L scapula (complete excision)   TEE WITHOUT CARDIOVERSION N/A 07/29/2015   Procedure: TRANSESOPHAGEAL ECHOCARDIOGRAM (TEE);  Surgeon: [name], MD;  Location: [place];  Service: Open Heart Surgery;  Laterality: N/A;   TONSILLECTOMY  1949   TOTAL KNEE ARTHROPLASTY Left 03/18/2016   cemented L TKR; Earnestine Leys, MD   Patient Active Problem List   Diagnosis  Date Noted   Cerebrovascular accident (CVA) ([place]) 03/11/2022   Cervical spondylosis 03/11/2022   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0-29.9) 10/07/2020   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   Vitamin D deficiency 08/01/2020   Polyp of sigmoid colon 08/01/2020   Anxiety 06/07/2020   Insomnia 06/07/2020   Gastroesophageal reflux disease 04/04/2020   Degenerative joint disease of hand 03/01/2020   Strain of muscle of right hip 08/28/2019   Trigger finger of left hand 07/07/2019   BPH (benign prostatic hyperplasia) 08/05/2018   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   Macular scar of both eyes 04/20/2018   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   Reduced visual acuity 04/20/2018   Scotoma involving central area of both eyes 04/20/2018   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/25/2018   Fatty liver 03/25/2018   PSA elevation 03/25/2018   Thrombocytopenia ([place]) 02/14/2018   Frequent PVCs 02/14/2018   Positive colorectal cancer screening using Cologuard test    Benign neoplasm of cecum    Benign neoplasm of transverse colon    Health maintenance examination 02/23/2017   Erectile dysfunction 02/23/2017   Obesity (BMI 30.0-34.9) 01/25/2017   Hx of CABG 01/24/2017   Medicare annual wellness visit, subsequent 10/21/2015   Advanced care planning/counseling discussion 10/21/2015   Biceps tendinitis 10/10/2015    Coronary artery disease of native artery of native heart with stable angina pectoris ([place])    DM2 (diabetes mellitus, type 2) ([place]) 08/12/2015   History of radiation exposure    Impingement syndrome of shoulder region 05/08/2015   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   Ischemic cardiomyopathy 01/02/2015   Hyperlipidemia 01/02/2015   Essential hypertension 01/02/2015   Osteoarthritis 01/02/2015   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/02/2014   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   Squamous cell carcinoma of vocal cord ([place]) 2008    PCP: Orland Mustard, MD  REFERRING PROVIDER: Hessie Knows, MD  REFERRING DIAG:  [phone] (ICD-10-CM) - Spondylosis without myelopathy or  radiculopathy, sacral and sacrococcygeal region  M41.57 (ICD-10-CM) - Other secondary scoliosis, lumbosacral region    Rationale for Evaluation and Treatment: Rehabilitation  THERAPY DIAG:  Muscle weakness (generalized)  Pain in left hip  Abnormality of gait and mobility  Bilateral low back pain without sciatica, unspecified chronicity  ONSET DATE: 08/21/2022  SUBJECTIVE:                                                                                                                                                                                           SUBJECTIVE STATEMENT:  Pt reports pain in leg has shifted more into his left buttock region- States his hamstring is doing a lot better. He reports shuffling more this week with walking.   PERTINENT HISTORY:   Patient is a 81 year old male referral for outpatient PT services from orthopedics- complaining of right hip and back pain- history per Dr. Rudene Christians office visit note from 09/02/2022- The patient presents for evaluation of hip and back pain. He had a history of during the weekend having a Halloween walk. It was uneven and in the dark. Since then, he has been having hip pain, also slept in a recliner and has been having significant back pain. It was uneven and in the  dark. Since then, he has been having hip pain.  The patient reports that he walked 3 weeks ago. He reports that when he went to the pumpkin wall, he was wearing sketchers and stepped on the trail. It was moving up and down and he was unable finish due to his pain. He states that he walked 8000 steps in 4 hours a day and was pushing cart. He walks by himself for short distances. He reports that his artificial knee here is stable and the other side it wanders. He reports gait is uneven. He states Dr Sabra Heck told him that something was off with his hip. He reports that when he does exercise, he wears braces for hip, and it helps with his hips. He reports that he wants to try physical therapy. He reports for recreation he walks but do does not walk regularly.   Received trigger point ultrasound guided Bilateral SI joint steroid injection on 09/02/2022- Paitent reports now no pain currently in right hip but experiencing some left buttock pain over past week.   PAIN:  Are you having pain? Yes: NPRS scale: 2-3/10 Pain location: Left posterior buttock/ hamstring Pain description: Nagging ache- Intermittent Aggravating factors: walking and pushing patients.  Relieving factors: standing,pushing patients   PRECAUTIONS: None  WEIGHT BEARING RESTRICTIONS: No  FALLS:  Has patient fallen in last 6 months? No  LIVING ENVIRONMENT: Lives with: lives with their spouse Lives in: House/apartment Stairs: No Has following equipment at home: None  OCCUPATION: Retired from [employer]. 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  NEXT MD VISIT: None  OBJECTIVE:   DIAGNOSTIC FINDINGS:  Nothing recent in medical notes  PATIENT SURVEYS:  Modified Oswestry 24%   SCREENING FOR RED FLAGS: Bowel or bladder incontinence: No Spinal tumors: No Cauda equina syndrome: No Compression fracture: No Abdominal  aneurysm: No  COGNITION: Overall cognitive status: Within functional limits for tasks assessed     SENSATION: WFL  MUSCLE LENGTH: Hamstrings: Right 58 deg; Left 48 deg   POSTURE: No Significant postural limitations  PALPATION: + tenderness along Left proximal posterior thigh  LUMBAR ROM:   AROM eval  Flexion WNL  Extension WNL  Right lateral flexion WNL  Left lateral flexion WNL  Right rotation WNL  Left rotation WNL   (Blank rows = not tested)   LOWER EXTREMITY MMT:    MMT Right eval Left eval  Hip flexion 5 3+  Hip extension 5 4  Hip abduction 5 4  Hip adduction 5 4  Hip internal rotation 5 5  Hip external rotation 5 5  Knee flexion 5 5  Knee extension 5 4  Ankle dorsiflexion 5 5  Ankle plantarflexion 5 5  Ankle inversion 5 5  Ankle eversion 5 5   (Blank rows = not tested)  LUMBAR SPECIAL TESTS:  Straight leg raise test: Negative, Slump test: Negative, and FABER test: Negative  FUNCTIONAL TESTS:  5 times sit to stand: 18.3 sec without UE support  GAIT: Distance walked: in clinic (approx 50 feet)  Assistive device utilized: None Level of assistance: Complete Independence Comments: Reciprocal steps   TODAY'S TREATMENT:                                                                                                                              DATE: 10/15/22  Supine: Left LE Hamstring lengthening stretch 3 sets of  60 sec hold - left LE Figure four stretch 60 seconds x 2 sets Supine leg over leg - piriformis stretch x 20 sec x 3 on left Review of added stretches to HEP (see below)   Manual: Prone Left LE  Tp release with IASTM x 5 min in piriformis and 5 min HS region in sidelye Manual stretching to left piriformis - hold 30 sec x 3 left LE Manual rolling stick to posterior piriformis and hamstring region x 5 min     PATIENT EDUCATION:  Education details: Purpose of PT and plan of care Person educated: Patient Education method:  Explanation, Demonstration, Tactile cues, and Verbal cues Education comprehension: verbalized understanding, returned demonstration, verbal cues required, and needs further education  HOME EXERCISE PROGRAM: Access Code: [email or web address] URL: [email or web address] Date: 10/16/2022 Prepared by: Sande Brothers  Exercises -  Supine Piriformis Stretch with Foot on Ground  - 1 x daily - 7 x weekly - 3 sets - 20-30 sec hold - Leg Overs  - 1 x daily - 7 x weekly - 3 sets - 20-30 sec hold - Supine Figure 4 Piriformis Stretch  - 1 x daily - 7 x weekly - 3 sets - 20-30 hold - Piriformis Mobilization on Foam Roll  - 1 x daily - 7 x weekly - 3 sets - 10 reps     Access Code: [email or web address] URL: [email or web address] Date: 10/08/2022 Prepared by: Sande Brothers  Exercises - Supine Isometric Hamstring Set  - 1 x daily - 3 x weekly - 2-3 sets - 10 reps - 5 hold - Seated Hamstring Set  - 3 x weekly - 3 sets - 10 reps - 5 hold - Seated Heel Slide  - 3 x weekly - 3 sets - 10 reps - Prone Knee Flexion  - 3 x weekly - 3 sets - 10 reps     Access Code: [email or web address] URL: [email or web address] Date: 09/15/2022 Prepared by: Janna Arch  Exercises - Seated Piriformis Stretch  - 1 x daily - 7 x weekly - 2 sets - 2 reps - 30 hold - Seated Piriformis Stretch  - 1 x daily - 7 x weekly - 2 sets - 2 reps - 30 hold - Supine Figure 4 Piriformis Stretch  - 1 x daily - 7 x weekly - 2 sets - 2 reps - 30 hold - Hooklying Single Knee to Chest  - 1 x daily - 7 x weekly - 2 sets - 10 reps - 5 hold -Hamstring stretch in seated and supine position 2 x 30 second holds    ASSESSMENT:  CLINICAL IMPRESSION: Patient with pain report shift from left hamstring region to more of piriformis region today. He responded well with all stretching stating "you are hitting the spot." Educated patient on specifics for piriformis stretching. He reported no pain at end of session and walked out of  PT without shuffling.  Pt will continue to benefit from skilled physical therapy intervention to address impairments, improve QOL, and attain therapy goals.    OBJECTIVE IMPAIRMENTS: Abnormal gait, decreased activity tolerance, decreased balance, decreased mobility, difficulty walking, impaired perceived functional ability, impaired flexibility, and pain.   ACTIVITY LIMITATIONS: sitting  PARTICIPATION LIMITATIONS: occupation  PERSONAL FACTORS: 1 comorbidity: Hypertension  are also affecting patient's functional outcome.   REHAB POTENTIAL: Good  CLINICAL DECISION MAKING: Evolving/moderate complexity  EVALUATION COMPLEXITY: Low   GOALS: Goals reviewed with patient? Yes  SHORT TERM GOALS: Target date: 11/27/2022  Pt will be independent with HEP in order to improve strength and decrease back pain in order to improve pain-free function at home and work.  Baseline: EVAL= Patient has no formal HEP in place Goal status: INITIAL   LONG TERM GOALS: Target date: 01/08/2023  Pt will improve FOTO to target score of 68 to display perceived improvements in ability to complete ADL's.  Baseline: EVAL = 56 Goal status: INITIAL  2.  Pt will decrease mODI score by at least 13 points in order demonstrate clinically significant reduction in back pain/disability.       Baseline: EVAL= 24 Goal status: INITIAL  3.  Pt will decrease worst back pain as reported on NPRS by at least 2 points in order to demonstrate clinically significant reduction in back pain.  Baseline: EVAL=3/10 Goal status: INITIAL  4.  Pt will decrease 5TSTS by at least 3 seconds  in order to demonstrate clinically significant improvement in LE strength.  Baseline: EVAL= 18.88 sec  Goal status: INITIAL    PLAN:  PT FREQUENCY: 1-2x/week  PT DURATION: 12 weeks  PLANNED INTERVENTIONS: Therapeutic exercises, Therapeutic activity, Neuromuscular re-education, Balance training, Gait training, Patient/Family education, Self Care,  Joint mobilization, Joint manipulation, Dry Needling, Electrical stimulation, Spinal manipulation, Spinal mobilization, Cryotherapy, Moist heat, and Manual therapy.  PLAN FOR NEXT SESSION: Manual Therapy for pain relief, strengthening of hips and progressive hamstring strengthening including donkey kicks as pt tolerates.   Lewis Moccasin, PT 10/16/2022, 8:40 AM

## 2022-10-20 ENCOUNTER — Ambulatory Visit: Payer: Medicare Other | Admitting: Physical Therapy

## 2022-10-20 DIAGNOSIS — M545 Low back pain, unspecified: Secondary | ICD-10-CM

## 2022-10-20 DIAGNOSIS — M6281 Muscle weakness (generalized): Secondary | ICD-10-CM

## 2022-10-20 DIAGNOSIS — M25552 Pain in left hip: Secondary | ICD-10-CM

## 2022-10-20 NOTE — Therapy (Signed)
OUTPATIENT PHYSICAL THERAPY THORACOLUMBAR TREATMENT   Patient Name: Shannon Chung MRN: [id number] DOB:02-Aug-1941, 81 y.o., male Today's Date: 10/20/2022   PT End of Session - 10/20/22 1325     Visit Number 8    Number of Visits 12    Date for PT Re-Evaluation 12/07/22    Progress Note Due on Visit 10    PT Start Time 5188    PT Stop Time 1420    PT Time Calculation (min) 43 min    Equipment Utilized During Treatment Back brace    Activity Tolerance Patient tolerated treatment well;No increased pain    Behavior During Therapy WFL for tasks assessed/performed                Past Medical History:  Diagnosis Date   Coronary artery disease    a. 06/2015 Cardiac CT: Ca score 1103 (84th %'ile);  b. 07/2015 Cath: LM 70, LAD 80p, 100/10m D1 70, D2 95, RI 75, RCA 100p/m;  c. 07/2015 CABG x 5 (LIMA->LAD, VG->Diag, VG->OM1->OM2, VG->OM3).   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 dilated, EF 35% improved to 45-50% (2015);  b. 07/2015 EF 25-35% by LV gram.   [place]one atrial fibrillation ([place] 1983   a. isolated episode, not on [place]   [place] thrombus of cardiac apex    a. 06/2014: LV; resolved with coumadin-->no residual on f/u echo, no longer on coumadin.   Osteoarthritis    a. R-shoulder, L-knee ([place]ra Heck[place]o)   Skin cancer    squamous and basal cell right forearm, SCC left cheek 10/04/20 sees derm regularly Dr. 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   XRT; right vocal cord; [place] f[place] 2013 or [place]   Stroke ([place]    Thrombocytopenia ([place]    Vitamin D deficiency    Past Surgical History:  Procedure Laterality Date   BICEPS TENDON REPAIR Right 1FarmerN/A 07/05/2015   Procedure: Left Heart Cath and Coronary Angiography;  Surgeon: TMinna Merritts MD;  [facility];  Service: Cardiovascular;  Laterality: N/A;   CATARACT EXTRACTION Left 12/2016   with keratoplasty   COLONOSCOPY  2007   COLONOSCOPY WITH PROPOFOL N/A 12/02/2017   TA, SSA, rpt 3 yrs(Tahiliani, Varnita B, MD)   COLONOSCOPY WITH PROPOFOL N/A 11/26/2020   Procedure: COLONOSCOPY WITH PROPOFOL;  Surgeon: WLucilla Lame MD;  [facility];  Service: Endoscopy;  Laterality: N/A;   CORONARY ARTERY BYPASS GRAFT N/A 07/29/2015   Procedure: CORONARY ARTERY BYPASS GRAFTING (CABG) x 5 (LIMA to LAD, SVG to DIAGONAL,  SVG SEQUENTIALLY to OM1 and OM2, SVG to OM3) with Endoscopic Vein Havesting of  GREATER SAPHENOUS VEIN from RIGHT THIGH and partial LOWER LEG ;  Surgeon: BGaye Pollack MD;  Location: [facility];  Service: Open Heart Surgery;  Laterality: N/A;   EYE SURGERY     b/l cataract and cornea replaced    HAND SURGERY     left hand 1st/2nd trigger fingers Dr. Sabra Heck ortho    JOINT REPLACEMENT     KNEE ARTHROSCOPY Left remote   MOHS SURGERY     left cheek scc 2022 Dr. Winifred Olive   MOHS SURGERY     x 5 facial scc   right biceps tendon     repair/re attachment    SKIN CANCER EXCISION  10/2015   BCC - L ala (pending MOHs) and L scapula (complete excision)   TEE WITHOUT CARDIOVERSION N/A 07/29/2015   Procedure: TRANSESOPHAGEAL ECHOCARDIOGRAM (TEE);  Surgeon: [name], MD;  Location: [place];  Service: Open Heart Surgery;  Laterality: N/A;   TONSILLECTOMY  1949   TOTAL KNEE ARTHROPLASTY Left 03/18/2016   cemented L TKR; Earnestine Leys, MD   Patient Active Problem List   Diagnosis  Date Noted   Cerebrovascular accident (CVA) ([place]) 03/11/2022   Cervical spondylosis 03/11/2022   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0-29.9) 10/07/2020   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   Vitamin D deficiency 08/01/2020   Polyp of sigmoid colon 08/01/2020   Anxiety 06/07/2020   Insomnia 06/07/2020   Gastroesophageal reflux disease 04/04/2020   Degenerative joint disease of hand 03/01/2020   Strain of muscle of right hip 08/28/2019   Trigger finger of left hand 07/07/2019   BPH (benign prostatic hyperplasia) 08/05/2018   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   Macular scar of both eyes 04/20/2018   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   Reduced visual acuity 04/20/2018   Scotoma involving central area of both eyes 04/20/2018   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/25/2018   Fatty liver 03/25/2018   PSA elevation 03/25/2018   Thrombocytopenia ([place]) 02/14/2018   Frequent PVCs 02/14/2018   Positive colorectal cancer screening using Cologuard test    Benign neoplasm of cecum    Benign neoplasm of transverse colon    Health maintenance examination 02/23/2017   Erectile dysfunction 02/23/2017   Obesity (BMI 30.0-34.9) 01/25/2017   Hx of CABG 01/24/2017   Medicare annual wellness visit, subsequent 10/21/2015   Advanced care planning/counseling discussion 10/21/2015   Biceps tendinitis 10/10/2015    Coronary artery disease of native artery of native heart with stable angina pectoris ([place])    DM2 (diabetes mellitus, type 2) ([place]) 08/12/2015   History of radiation exposure    Impingement syndrome of shoulder region 05/08/2015   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   Ischemic cardiomyopathy 01/02/2015   Hyperlipidemia 01/02/2015   Essential hypertension 01/02/2015   Osteoarthritis 01/02/2015   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/02/2014   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   Squamous cell carcinoma of vocal cord ([place]) 2008    PCP: Orland Mustard, MD  REFERRING PROVIDER: Hessie Knows, MD  REFERRING DIAG:  [phone] (ICD-10-CM) - Spondylosis without myelopathy or  radiculopathy, sacral and sacrococcygeal region  M41.57 (ICD-10-CM) - Other secondary scoliosis, lumbosacral region    Rationale for Evaluation and Treatment: Rehabilitation  THERAPY DIAG:  Muscle weakness (generalized)  Pain in left hip  Bilateral low back pain without sciatica, unspecified chronicity  ONSET DATE: 08/21/2022  SUBJECTIVE:                                                                                                                                                                                           SUBJECTIVE STATEMENT:  Pt reports pain in leg has shifted more into his left buttock region- States his hamstring is doing a lot better. He reports shuffling more this week with walking.   PERTINENT HISTORY:   Patient is a 81 year old male referral for outpatient PT services from orthopedics- complaining of right hip and back pain- history per Dr. Rudene Christians office visit note from 09/02/2022- The patient presents for evaluation of hip and back pain. He had a history of during the weekend having a Halloween walk. It was uneven and in the dark. Since then, he has been having hip pain, also slept in a recliner and has been having significant back pain. It was uneven and in the dark. Since then, he has been having  hip pain.  The patient reports that he walked 3 weeks ago. He reports that when he went to the pumpkin [place], he was wearing sketchers and stepped on the trail. It was moving up and down and he was unable finish due to his pain. He states that he walked 8000 steps in 4 hours a day and was pushing cart. He walks by himself for short distances. He reports that his artificial knee here is stable and the other side it wanders. He reports gait is uneven. He states Dr Sabra Heck told him that something was off with his hip. He reports that when he does exercise, he wears braces for hip, and it helps with his hips. He reports that he wants to try physical therapy. He reports for recreation he walks but do does not walk regularly.   Received trigger point ultrasound guided Bilateral SI joint steroid injection on 09/02/2022- Paitent reports now no pain currently in right hip but experiencing some left buttock pain over past week.   PAIN:  Are you having pain? Yes: NPRS scale: 7/10 Pain location: Left posterior buttock Pain description: Nagging ache- Intermittent Aggravating factors: walking and pushing patients.  Relieving factors: standing,pushing patients   PRECAUTIONS: None  WEIGHT BEARING RESTRICTIONS: No  FALLS:  Has patient fallen in last 6 months? No  LIVING ENVIRONMENT: Lives with: lives with  their spouse Lives in: House/apartment Stairs: No Has following equipment at home: None  OCCUPATION: Retired from [employer]. 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  NEXT MD VISIT: None  OBJECTIVE:   DIAGNOSTIC FINDINGS:  Nothing recent in medical notes  PATIENT SURVEYS:  Modified Oswestry 24%   SCREENING FOR RED FLAGS: Bowel or bladder incontinence: No Spinal tumors: No Cauda equina syndrome: No Compression fracture: No Abdominal aneurysm: No  COGNITION: Overall cognitive  status: Within functional limits for tasks assessed     SENSATION: WFL  MUSCLE LENGTH: Hamstrings: Right 58 deg; Left 48 deg   POSTURE: No Significant postural limitations  PALPATION: + tenderness along Left proximal posterior thigh  LUMBAR ROM:   AROM eval  Flexion WNL  Extension WNL  Right lateral flexion WNL  Left lateral flexion WNL  Right rotation WNL  Left rotation WNL   (Blank rows = not tested)   LOWER EXTREMITY MMT:    MMT Right eval Left eval  Hip flexion 5 3+  Hip extension 5 4  Hip abduction 5 4  Hip adduction 5 4  Hip internal rotation 5 5  Hip external rotation 5 5  Knee flexion 5 5  Knee extension 5 4  Ankle dorsiflexion 5 5  Ankle plantarflexion 5 5  Ankle inversion 5 5  Ankle eversion 5 5   (Blank rows = not tested)  LUMBAR SPECIAL TESTS:  Straight leg raise test: Negative, Slump test: Negative, and FABER test: Negative  FUNCTIONAL TESTS:  5 times sit to stand: 18.3 sec without UE support  GAIT: Distance walked: in clinic (approx 50 feet)  Assistive device utilized: None Level of assistance: Complete Independence Comments: Reciprocal steps   TODAY'S TREATMENT:                                                                                                                              DATE: 10/20/22   Supine: Left LE Hamstring lengthening stretch 3 sets of  60 sec hold - left LE Figure four stretch 60 seconds x 2 sets Supine leg over leg - piriformis stretch x 30 sec x 3 on left  Manual: Prone Left LE Tp release with IASTM x 5 min in piriformis and 5 min HS region in sidelye Manual stretching to left piriformis - hold 30 sec x 3 left LE Manual rolling stick to posterior piriformis and hamstring region x 5 min  Bridge with ball squeeze 3 x 10 reps   Hip abduction side step 2 x 10 ea direction with RTB, cues for proper eccentric control   PATIENT EDUCATION:  Education details: Purpose of PT and plan of care Person educated:  Patient Education method: Explanation, Demonstration, Tactile cues, and Verbal cues Education comprehension: verbalized understanding, returned demonstration, verbal cues required, and needs further education  HOME EXERCISE PROGRAM: Access Code: [email or web address]  URL: [email or web address] Date: 10/16/2022 Prepared by: Sande Brothers  Exercises - Supine Piriformis Stretch with Foot on Ground  - 1 x daily - 7 x weekly - 3 sets - 20-30 sec hold - Leg Overs  - 1 x daily - 7 x weekly - 3 sets - 20-30 sec hold - Supine Figure 4 Piriformis Stretch  - 1 x daily - 7 x weekly - 3 sets - 20-30 hold - Piriformis Mobilization on Foam Roll  - 1 x daily - 7 x weekly - 3 sets - 10 reps     Access Code: [email or web address] URL: [email or web address] Date: 10/08/2022 Prepared by: Sande Brothers  Exercises - Supine Isometric Hamstring Set  - 1 x daily - 3 x weekly - 2-3 sets - 10 reps - 5 hold - Seated Hamstring Set  - 3 x weekly - 3 sets - 10 reps - 5 hold - Seated Heel Slide  - 3 x weekly - 3 sets - 10 reps - Prone Knee Flexion  - 3 x weekly - 3 sets - 10 reps     Access Code: [email or web address] URL: [email or web address] Date: 09/15/2022 Prepared by: Janna Arch  Exercises - Seated Piriformis Stretch  - 1 x daily - 7 x weekly - 2 sets - 2 reps - 30 hold - Seated Piriformis Stretch  - 1 x daily - 7 x weekly - 2 sets - 2 reps - 30 hold - Supine Figure 4 Piriformis Stretch  - 1 x daily - 7 x weekly - 2 sets - 2 reps - 30 hold - Hooklying Single Knee to Chest  - 1 x daily - 7 x weekly - 2 sets - 10 reps - 5 hold -Hamstring stretch in seated and supine position 2 x 30 second holds    ASSESSMENT:  CLINICAL IMPRESSION: Patient with continued pain increase following volunteering and pushing wheelchairs a lot. Pt instructed may be beneficial to decrease frequency of completing this or taking a break due to consistent exacerbation of the discomfort. Continued with  targeted piriformis muscle TP release and hip strengthening with good response as pt reports improved pain following. Pt will continue to benefit from skilled physical therapy intervention to address impairments, improve QOL, and attain therapy goals.     OBJECTIVE IMPAIRMENTS: Abnormal gait, decreased activity tolerance, decreased balance, decreased mobility, difficulty walking, impaired perceived functional ability, impaired flexibility, and pain.   ACTIVITY LIMITATIONS: sitting  PARTICIPATION LIMITATIONS: occupation  PERSONAL FACTORS: 1 comorbidity: Hypertension  are also affecting patient's functional outcome.   REHAB POTENTIAL: Good  CLINICAL DECISION MAKING: Evolving/moderate complexity  EVALUATION COMPLEXITY: Low   GOALS: Goals reviewed with patient? Yes  SHORT TERM GOALS: Target date: 12/01/2022  Pt will be independent with HEP in order to improve strength and decrease back pain in order to improve pain-free function at home and work.  Baseline: EVAL= Patient has no formal HEP in place Goal status: INITIAL   LONG TERM GOALS: Target date: 12/07/22  Pt will improve FOTO to target score of 68 to display perceived improvements in ability to complete ADL's.  Baseline: EVAL = 56 Goal status: INITIAL  2.  Pt will decrease mODI score by at least 13 points in order demonstrate clinically significant reduction in back pain/disability.       Baseline: EVAL= 24 Goal status: INITIAL  3.  Pt will decrease worst back pain as reported on NPRS by at least 2 points in order to demonstrate clinically significant reduction in back pain.  Baseline: EVAL=3/10  Goal status: INITIAL  4.  Pt will decrease 5TSTS by at least 3 seconds in order to demonstrate clinically significant improvement in LE strength.  Baseline: EVAL= 18.88 sec  Goal status: INITIAL    PLAN:  PT FREQUENCY: 1-2x/week  PT DURATION: 12 weeks  PLANNED INTERVENTIONS: Therapeutic exercises, Therapeutic activity,  Neuromuscular re-education, Balance training, Gait training, Patient/Family education, Self Care, Joint mobilization, Joint manipulation, Dry Needling, Electrical stimulation, Spinal manipulation, Spinal mobilization, Cryotherapy, Moist heat, and Manual therapy.  PLAN FOR NEXT SESSION: Manual Therapy for pain relief, strengthening of hips and progressive hamstring strengthening including donkey kicks as pt tolerates. Hip strength as pt pain tolerates.   Particia Lather, PT 10/20/2022, 3:15 PM

## 2022-10-22 ENCOUNTER — Encounter: Payer: Medicare Other | Admitting: Physical Therapy

## 2022-10-27 ENCOUNTER — Ambulatory Visit: Payer: Medicare Other

## 2022-10-27 DIAGNOSIS — M545 Low back pain, unspecified: Secondary | ICD-10-CM

## 2022-10-27 DIAGNOSIS — M6281 Muscle weakness (generalized): Secondary | ICD-10-CM

## 2022-10-27 DIAGNOSIS — R269 Unspecified abnormalities of gait and mobility: Secondary | ICD-10-CM

## 2022-10-27 DIAGNOSIS — M25552 Pain in left hip: Secondary | ICD-10-CM

## 2022-10-27 NOTE — Therapy (Signed)
OUTPATIENT PHYSICAL THERAPY THORACOLUMBAR TREATMENT   Patient Name: Shannon Chung MRN: [id number] DOB:November 24, 1940, 81 y.o., male Today's Date: 10/28/2022   PT End of Session - 10/27/22 1608     Visit Number 9    Number of Visits 12    Date for PT Re-Evaluation 12/07/22    Progress Note Due on Visit 10    PT Start Time 7353    PT Stop Time 1427    PT Time Calculation (min) 35 min    Equipment Utilized During Treatment Back brace    Activity Tolerance Patient tolerated treatment well;No increased pain    Behavior During Therapy WFL for tasks assessed/performed                 Past Medical History:  Diagnosis Date   Coronary artery disease    a. 06/2015 Cardiac CT: Ca score 1103 (84th %'ile);  b. 07/2015 Cath: LM 70, LAD 80p, 100/78m D1 70, D2 95, RI 75, RCA 100p/m;  c. 07/2015 CABG x 5 (LIMA->LAD, VG->Diag, VG->OM1->OM2, VG->OM3).   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 dilated, EF 35% improved to 45-50% (2015);  b. 07/2015 EF 25-35% by LV gram.   [place]one atrial fibrillation ([place] 1983   a. isolated episode, [place]   [place] thrombus of cardiac apex    a. 06/2014: LV; resolved with coumadin-->no residual on f/u echo, no longer on coumadin.   Osteoarthritis    a. R-shoulder, L-knee ([place] Heck[place]o)   Skin cancer    squamous and basal cell right forearm, SCC left cheek 10/04/20 sees derm regularly Dr. 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   XRT; right vocal cord; [place] 2013 or 2015 [place]   St[facility] ([facility]    Thrombocytopenia ([place]    Vitamin D deficiency    Past Surgical History:  Procedure Laterality Date   BICEPS TENDON REPAIR Right [place] 07/05/2015   Procedure: Left Heart Cath and Coronary Angiography;  Surgeon: TMinna Merritts MD;  [place] [facility];  Service: Cardiovascular;  Laterality: N/A;   CATARACT EXTRACTION Left 12/2016   with keratoplasty   COLONOSCOPY  2007   COLONOSCOPY WITH PROPOFOL N/A 12/02/2017   TA, SSA, rpt 3 yrs(Tahiliani, Varnita B, MD)   COLONOSCOPY WITH PROPOFOL N/A 11/26/2020   Procedure: COLONOSCOPY WITH PROPOFOL;  Surgeon: WLucilla Lame MD;  [facility];  Service: Endoscopy;  Laterality: N/A;   CORONARY ARTERY BYPASS GRAFT N/A 07/29/2015   Procedure: CORONARY ARTERY BYPASS GRAFTING (CABG) x 5 (LIMA to LAD, SVG to DIAGONAL,  SVG SEQUENTIALLY to OM1 and OM2, SVG to OM3) with Endoscopic Vein Hav[place] of  GREATER SAPHENOUS VEIN from RIGHT THIGH and partial LOWER LEG ;  Surgeon: BGaye Pollack MD;  Location: [facility];  Service: Open Heart Surgery;  Laterality: N/A;   EYE SURGERY     b/l cataract and cornea replaced    HAND SURGERY     left hand 1st/2nd trigger fingers Dr. Sabra Heck ortho    JOINT REPLACEMENT     KNEE ARTHROSCOPY Left remote   MOHS SURGERY     left cheek scc 2022 Dr. Winifred Olive   MOHS SURGERY     x 5 facial scc   right biceps tendon     repair/re attachment    SKIN CANCER EXCISION  10/2015   BCC - L ala (pending MOHs) and L scapula (complete excision)   TEE WITHOUT CARDIOVERSION N/A 07/29/2015   Procedure: TRANSESOPHAGEAL ECHOCARDIOGRAM (TEE);  Surgeon: [name], MD;  Location: [place];  Service: Open Heart Surgery;  Laterality: N/A;   TONSILLECTOMY  1949   TOTAL KNEE ARTHROPLASTY Left 03/18/2016   cemented L TKR; Earnestine Leys, MD   Patient Active Problem List   Diagnosis  Date Noted   Cerebrovascular accident (CVA) ([place]) 03/11/2022   Cervical spondylosis 03/11/2022   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9) 10/07/2020   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   Vitamin D deficiency 08/01/2020   Polyp of sigmoid colon 08/01/2020   Anxiety 06/07/2020   Insomnia 06/07/2020   Gastroesophageal reflux disease 04/04/2020   Degenerative joint disease of hand 03/01/2020   Strain of muscle of right hip 08/28/2019   Trigger finger of left hand 07/07/2019   BPH (benign prostatic hyperplasia) 08/05/2018   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   Macular scar of both eyes 04/20/2018   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   Reduced visual acuity 04/20/2018   Scotoma involving central area of both eyes 04/20/2018   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/25/2018   Fatty liver 03/25/2018   PSA elevation 03/25/2018   Thrombocytopenia ([place]) 02/14/2018   Frequent PVCs 02/14/2018   Positive colorectal cancer screening using Cologuard test    Benign neoplasm of cecum    Benign neoplasm of transverse colon    Health maintenance examination 02/23/2017   Erectile dysfunction 02/23/2017   Obesity (BMI 30.0-34.9) 01/25/2017   Hx of CABG 01/24/2017   Medicare annual wellness visit, subsequent 10/21/2015   Advanced care planning/counseling discussion 10/21/2015   Biceps tendinitis 10/10/2015    Coronary artery disease of native artery of native heart with stable angina pectoris ([place])    DM2 (diabetes mellitus, type 2) ([place]) 08/12/2015   History of radiation exposure    Impingement syndrome of shoulder region 05/08/2015   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   Ischemic cardiomyopathy 01/02/2015   Hyperlipidemia 01/02/2015   Essential hypertension 01/02/2015   Osteoarthritis 01/02/2015   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/02/2014   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   Squamous cell carcinoma of vocal cord ([place]) 2008    PCP: Orland Mustard, MD  REFERRING PROVIDER: Hessie Knows, MD  REFERRING DIAG:  [id number] (ICD-10-CM) - Spondylosis without myelopathy or  radiculopathy, sacral and sacrococcygeal region  M41.57 (ICD-10-CM) - Other secondary scoliosis, lumbosacral region    Rationale for Evaluation and Treatment: Rehabilitation  THERAPY DIAG:  Muscle weakness (generalized)  Pain in left hip  Abnormality of gait and mobility  Bilateral low back pain without sciatica, unspecified chronicity  ONSET DATE: 08/21/2022  SUBJECTIVE:                                                                                                                                                                                           SUBJECTIVE STATEMENT:  Pt reports pain is much improved- rates at a 1/10 and states he is going to see Dr. Rudene Christians next week for follow up for "popping" in his knee   PERTINENT HISTORY:   Patient is a 81 year old male referral for outpatient PT services from orthopedics- complaining of right hip and back pain- history per Dr. Rudene Christians office visit note from 09/02/2022- The patient presents for evaluation of hip and back pain. He had a history of during the weekend having a Halloween walk. It was uneven and in the dark. Since then, he has been having hip pain, also slept in a recliner and has been having significant back pain. It was uneven and in the dark. Since then, he has  been having hip pain.  The patient reports that he walked 3 weeks ago. He reports that when he went to the pumpkin [place], he was wearing sketchers and stepped on the trail. It was moving up and down and he was unable finish due to his pain. He states that he walked 8000 steps in 4 hours a day and was pushing cart. He walks by himself for short distances. He reports that his artificial knee here is stable and the other side it wanders. He reports gait is uneven. He states Dr Sabra Heck told him that something was off with his hip. He reports that when he does exercise, he wears braces for hip, and it helps with his hips. He reports that he wants to try physical therapy. He reports for recreation he walks but do does not walk regularly.   Received trigger point ultrasound guided Bilateral SI joint steroid injection on 09/02/2022- Paitent reports now no pain currently in right hip but experiencing some left buttock pain over past week.   PAIN:  Are you having pain? Yes: NPRS scale: 7/10 Pain location: Left posterior buttock Pain description: Nagging ache- Intermittent Aggravating factors: walking and pushing patients.  Relieving factors: standing,pushing patients   PRECAUTIONS: None  WEIGHT BEARING RESTRICTIONS: No  FALLS:  Has patient fallen in last 6 months? No  LIVING ENVIRONMENT: Lives with: lives with their spouse Lives in: House/apartment Stairs: No Has following equipment at home: None  OCCUPATION: Retired from [employer]. 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  NEXT MD VISIT: None  OBJECTIVE:   DIAGNOSTIC FINDINGS:  Nothing recent in medical notes  PATIENT SURVEYS:  Modified Oswestry 24%   SCREENING FOR RED FLAGS: Bowel or bladder incontinence: No Spinal tumors: No Cauda equina syndrome: No Compression fracture: No Abdominal aneurysm: No  COGNITION: Overall  cognitive status: Within functional limits for tasks assessed     SENSATION: WFL  MUSCLE LENGTH: Hamstrings: Right 58 deg; Left 48 deg   POSTURE: No Significant postural limitations  PALPATION: + tenderness along Left proximal posterior thigh  LUMBAR ROM:   AROM eval  Flexion WNL  Extension WNL  Right lateral flexion WNL  Left lateral flexion WNL  Right rotation WNL  Left rotation WNL   (Blank rows = not tested)   LOWER EXTREMITY MMT:    MMT Right eval Left eval  Hip flexion 5 3+  Hip extension 5 4  Hip abduction 5 4  Hip adduction 5 4  Hip internal rotation 5 5  Hip external rotation 5 5  Knee flexion 5 5  Knee extension 5 4  Ankle dorsiflexion 5 5  Ankle plantarflexion 5 5  Ankle inversion 5 5  Ankle eversion 5 5   (Blank rows = not tested)  LUMBAR SPECIAL TESTS:  Straight leg raise test: Negative, Slump test: Negative, and FABER test: Negative  FUNCTIONAL TESTS:  5 times sit to stand: 18.3 sec without UE support  GAIT: Distance walked: in clinic (approx 50 feet)  Assistive device utilized: None Level of assistance: Complete Independence Comments: Reciprocal steps   TODAY'S TREATMENT:                                                                                                                              DATE: 10/27/22    Bridging- feet on mat 2 sets of 10 reps   Deadlift using 15lb kettle bell (VC and visual demo for correct form) x 12 reps (no pain or reported difficulty)   Forward lunge squat onto BOSU (minimal knee flex) in // bars with min BUE support x 12 reps alt LE  Golfers lean reaching for cone positioned in front of patient resting on top of 6" block x12 reps each LE (VC for form)  SLS attempted for hip stabilization - patient with difficulty maintaining > 5 sec either side  Step tap onto progressive higher surfaces- 1/2 foam; 6" step block; and then top of cone (10 reps each LE for each object)    PATIENT EDUCATION:   Education details: Purpose of PT and plan of care Person educated: Patient Education method: Explanation, Demonstration, Tactile cues, and Verbal cues Education comprehension: verbalized understanding, returned demonstration,  verbal cues required, and needs further education  HOME EXERCISE PROGRAM: Access Code: [email or web address] URL: [email or web address] Date: 10/16/2022 Prepared by: Sande Brothers  Exercises - Supine Piriformis Stretch with Foot on Ground  - 1 x daily - 7 x weekly - 3 sets - 20-30 sec hold - Leg Overs  - 1 x daily - 7 x weekly - 3 sets - 20-30 sec hold - Supine Figure 4 Piriformis Stretch  - 1 x daily - 7 x weekly - 3 sets - 20-30 hold - Piriformis Mobilization on Foam Roll  - 1 x daily - 7 x weekly - 3 sets - 10 reps     Access Code: [email or web address] URL: [email or web address] Date: 10/08/2022 Prepared by: Sande Brothers  Exercises - Supine Isometric Hamstring Set  - 1 x daily - 3 x weekly - 2-3 sets - 10 reps - 5 hold - Seated Hamstring Set  - 3 x weekly - 3 sets - 10 reps - 5 hold - Seated Heel Slide  - 3 x weekly - 3 sets - 10 reps - Prone Knee Flexion  - 3 x weekly - 3 sets - 10 reps     Access Code: [email or web address] URL: [email or web address] Date: 09/15/2022 Prepared by: Janna Arch  Exercises - Seated Piriformis Stretch  - 1 x daily - 7 x weekly - 2 sets - 2 reps - 30 hold - Seated Piriformis Stretch  - 1 x daily - 7 x weekly - 2 sets - 2 reps - 30 hold - Supine Figure 4 Piriformis Stretch  - 1 x daily - 7 x weekly - 2 sets - 2 reps - 30 hold - Hooklying Single Knee to Chest  - 1 x daily - 7 x weekly - 2 sets - 10 reps - 5 hold -Hamstring stretch in seated and supine position 2 x 30 second holds    ASSESSMENT:  CLINICAL IMPRESSION: Patient presents with minimal pain at beginning of session and no worsening throughout session as he continued with some LE (hip/hamstring strengthening). Treatment focused on mostly  closed chain - pain free exercises designed to target the hamstrings and patient responded well as seen by ability to complete all prescribed therex without increased pain or difficulty- other than weakness. Overall pain continues to diminish and  Pt will continue to benefit from skilled physical therapy intervention to address impairments, improve QOL, and attain therapy goals.     OBJECTIVE IMPAIRMENTS: Abnormal gait, decreased activity tolerance, decreased balance, decreased mobility, difficulty walking, impaired perceived functional ability, impaired flexibility, and pain.   ACTIVITY LIMITATIONS: sitting  PARTICIPATION LIMITATIONS: occupation  PERSONAL FACTORS: 1 comorbidity: Hypertension  are also affecting patient's functional outcome.   REHAB POTENTIAL: Good  CLINICAL DECISION MAKING: Evolving/moderate complexity  EVALUATION COMPLEXITY: Low   GOALS: Goals reviewed with patient? Yes  SHORT TERM GOALS: Target date: 12/01/2022  Pt will be independent with HEP in order to improve strength and decrease back pain in order to improve pain-free function at home and work.  Baseline: EVAL= Patient has no formal HEP in place Goal status: INITIAL   LONG TERM GOALS: Target date: 12/07/22  Pt will improve FOTO to target score of 68 to display perceived improvements in ability to complete ADL's.  Baseline: EVAL = 56 Goal status: INITIAL  2.  Pt will decrease mODI score by at least 13 points in order demonstrate clinically significant reduction in back pain/disability.       Baseline: EVAL= 24 Goal status: INITIAL  3.  Pt will  decrease worst back pain as reported on NPRS by at least 2 points in order to demonstrate clinically significant reduction in back pain.  Baseline: EVAL=3/10 Goal status: INITIAL  4.  Pt will decrease 5TSTS by at least 3 seconds in order to demonstrate clinically significant improvement in LE strength.  Baseline: EVAL= 18.88 sec  Goal status:  INITIAL    PLAN:  PT FREQUENCY: 1-2x/week  PT DURATION: 12 weeks  PLANNED INTERVENTIONS: Therapeutic exercises, Therapeutic activity, Neuromuscular re-education, Balance training, Gait training, Patient/Family education, Self Care, Joint mobilization, Joint manipulation, Dry Needling, Electrical stimulation, Spinal manipulation, Spinal mobilization, Cryotherapy, Moist heat, and Manual therapy.  PLAN FOR NEXT SESSION: Manual Therapy for pain relief, strengthening of hips and progressive hamstring strengthening pt tolerates. Hip strength as pt pain tolerates.   Lewis Moccasin, PT 10/28/2022, 7:12 AM

## 2022-10-27 NOTE — Progress Notes (Unsigned)
Cardiology Clinic Note   Patient Name: Shannon Chung Date of Encounter: 10/29/2022  Primary Care Provider:  [name], [name], MD Primary Cardiologist:  Ida Rogue, MD  Patient Profile    Shannon Chung is a 81 y.o. male with a past medical history of CAD s/p CABG x 08 July 2015, bilateral carotid stenosis, left ventricular apical thrombus, ICM, frequent PVCs, hypertension, hyperlipidemia, aortic atherosclerosis, CVA, T2DM who presents to the clinic today for 1 year follow-up of chronic cardiac conditions.   Past Medical History    Past Medical History:  Diagnosis Date   Coronary artery disease    a. 06/2015 Cardiac CT: Ca score 1103 (84th %'ile);  b. 07/2015 Cath: LM 70, LAD 80p, 100/52m D1 70, D2 95, RI 75, RCA 100p/m;  c. 07/2015 CABG x 5 (LIMA->LAD, VG->Diag, VG->OM1->OM2, VG->OM3).   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 dilated, EF 35% improved to 45-50% (2015);  b. 07/2015 EF 25-35% by LV gram.   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 isolated episode, not on [place]s   Mural thrombus of cardiac apex    a. 06/2014: LV; resolved with coumadin-->no residual on f/u echo, no longer on coumadin.   Osteoarthritis    a. R-shoulder, L-knee ([place])   Skin cancer    squamous and basal cell right forearm, SCC left cheek 10/04/20 sees derm regularly Dr. 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    XRT; right vocal cord; [place]   St[facility] ([facility]    Thrombocytopenia ([place]    Vitamin D deficiency    Past Surgical History:  Procedure Laterality Date   BICEPS TENDON REPAIR Right [place] 07/05/2015   Procedure: Left Heart Cath and Coronary Angiography;  Surgeon: TMinna Merritts MD;  [facility];  Service: Cardiovascular;  Laterality: N/A;   CATARACT EXTRACTION Left 12/2016   with keratoplasty   COLONOSCOPY  2007   COLONOSCOPY WITH PROPOFOL N/A 12/02/2017   TA, SSA, rpt 3 yrs(Tahiliani, Varnita B, MD)   COLONOSCOPY WITH PROPOFOL N/A 11/26/2020   Procedure: COLONOSCOPY WITH PROPOFOL;  Surgeon: WLucilla Lame MD;  [facility];  Service: Endoscopy;  Laterality: N/A;   CORONARY ARTERY BYPASS GRAFT N/A 07/29/2015   Procedure: CORONARY ARTERY BYPASS GRAFTING (CABG) x 5 (LIMA to LAD, SVG to DIAGONAL,  SVG SEQUENTIALLY to OM1 and OM2, SVG to OM3) with Endoscopic Vein [place] of  GREATER SAPHENOUS VEIN from RIGHT THIGH and partial LOWER LEG ;  Surgeon: BGaye Pollack MD;  [facility];  Service: Open Heart Surgery;  Laterality: N/A;   EYE SURGERY     b/l cataract and cornea replaced    HAND SURGERY     left hand  1st/2nd trigger fingers Dr. Sabra Heck ortho    JOINT REPLACEMENT     KNEE ARTHROSCOPY Left remote   MOHS SURGERY     left cheek scc 2022 Dr. Winifred Olive   MOHS SURGERY     x 5 facial scc   right biceps tendon     repair/re attachment    SKIN CANCER EXCISION  10/2015   BCC - L ala (pending MOHs) and L scapula (complete excision)   TEE WITHOUT CARDIOVERSION N/A 07/29/2015   Procedure: TRANSESOPHAGEAL ECHOCARDIOGRAM (TEE);  Surgeon: [name], MD;  Location: [place];  Service: Open Heart Surgery;  Laterality: N/A;   TONSILLECTOMY  1949   TOTAL KNEE ARTHROPLASTY Left 03/18/2016   cemented L TKR; Earnestine Leys, MD    Allergies  Allergies  Allergen Reactions   Bee Pollen Itching   Pollen  Extract Itching    History of Present Illness    Shannon Chung has a past medical history of: CAD.  Coronary CT 06/26/2015: Calcium score 1103 (84th percentile). Almost all calcium located in proximal to mid LAD.  LHC 07/05/2015: LM 70%, mid LAD 1 100%, mid LAD 2 90%, 2nd diagonal 95%, proximal LAD 80%, ramus 75%, 1st diagonal 70%, lateral 1st diagonal 70%, proximal to mid RCA 100%. Referred to CTS.  CABG x 5 07/29/2015: LIMA to LAD, SVG to diagonal, SVG to OM3, sequential SVG to OM1 and OM2.  Left ventricular apical thrombus/ischemic CM.  Echo 05/30/2014 from outside facility: Stagnant blood flow within LV apex likely thrombus within the apex. Moderately decreased global LV systolic function, EF 60%. Apical septal, apical anterior, apical lateral, and apical inferior wall segments akinetic. Impaired relaxation. Mild LVH. Sclerotic aortic valve leaflets without stenosis or regurgitation.  Echo 09/17/2014 from outside facility: LV function normal, EF 45-50%. Global systolic LV contractility mildly decreased. Moderate concentric LVH. Apical septal, apical inferior, and apical cap akinesis. Slow flow demonstrated without evidence of thrombus. Trace TR and PR. Grade I DD.  Echo 11/11/2015: EF 55-60%. Mild concentric LVH. Grade I DD.   Aortic atherosclerosis.  Bilateral carotid artery disease. Carotid US 03/19/2022: Right > left atherosclerotic plaque of the distal common carotid arteries and carotid bulbs <50%.   Frequent PVCs.  Hypertension. Hyperlipidemia. Lipid panel 12/04/2021: LDL 37, HDL 46, TG 114, total 106.   CVA.  T2DM.   Mr. Shannon Chung was first evaluated by Dr. Rockey Situ on 01/02/2015 to establish care after moving to the area. He reported a history of ischemic cardiomyopathy with EF of 35%, cardiac PET showing scar in the apical and periapical region, mural thrombus on anticoagulation in July 2015. He did not have a LHC at that time. Dr. Rockey Situ ordered a coronary CT which showed a calcium score  of 1103 and most calcium in proximal to mid LAD. LHC September 2016 showed severe multivessel disease and LM disease so he was referred to CT surgery. He underwent CABG x 5 on 07/29/2015. He was last seen in the office on 10/07/2021 by Dr. Rockey Situ. He was doing well and no changes were made.   Today, patient continues to do well. He denies shortness of breath or dyspnea on exertion. No chest pain, pressure, or tightness. Denies lower extremity edema, orthopnea, or PND. No palpitations. 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 He also does physical therapy twice a week for a pulled hamstring. He states his hamstring/buttock pain has resolved so he is now working on increasing his strength. His  PCP recently left the practice so he is establishing with a new one in February. He has no complaints or concerns today.   Home Medications    Current Meds  Medication Sig   acetaminophen (TYLENOL) 500 MG tablet Take 500 mg by mouth every 6 (six) hours as needed for mild pain.    aspirin 81 MG EC tablet Take 162 mg by mouth daily. Swallow whole.   atorvastatin (LIPITOR) 40 MG tablet TAKE 1 TABLET BY MOUTH  DAILY   Cholecalciferol (VITAMIN D-3) 125 MCG (5000 UT) TABS Take by mouth daily.   ezetimibe (ZETIA) 10 MG tablet Take 1 tablet (10 mg total) by mouth daily.   famotidine (PEPCID) 20 MG tablet TAKE 1 TABLET BY MOUTH 2 TIMES DAILY AS NEEDED FOR HEARTBURN/INDIGESTION. D/C ZANTAC   fluticasone (FLONASE) 50 MCG/ACT nasal spray Place 1 spray into both nostrils as needed for allergies. Reported on 03/18/2016   lansoprazole (PREVACID) 30 MG capsule Take 1 capsule (30 mg total) by mouth every morning.   lisinopril (ZESTRIL) 10 MG tablet TAKE 1 TABLET BY MOUTH  DAILY   metFORMIN (GLUCOPHAGE) 500 MG tablet Take 1 tablet (500 mg total) by mouth 2 (two) times daily with a meal.   metoprolol succinate (TOPROL-XL) 25 MG 24 hr tablet TAKE 1 TABLET BY MOUTH ONCE  DAILY   prednisoLONE acetate  (PRED FORTE) 1 % ophthalmic suspension Place 1 drop into both eyes once daily   sildenafil (REVATIO) 20 MG tablet TAKE TWO TO FOUR TABLETS BY MOUTH ONCE DAILY AS NEEDED   vitamin B-12 (CYANOCOBALAMIN) 500 MCG tablet Take 500 mcg by mouth daily. Reported on 03/17/2016    Family History    Family History  Problem Relation Age of Onset   CAD Father 11       MI   Hypertension Father    Hyperlipidemia Father    Alcoholism Father    Diabetes Father    Cancer Daughter        dx'ed 89 retroperitoneal liposarcoma    He indicated that his mother is deceased. He indicated that his father is deceased. He indicated that only one of his two daughters is alive.   Social History    Social History   Socioeconomic History   Marital status: Married    Spouse name: [place]   Number of children: 2   Years of education: Not on file   Highest education level: Some college, no degree  Occupational History    Comment: retired  Tobacco Use   Smoking status: Former    Packs/day: 0.50    Years: 10.00    Total pack years: 5.00    Types: Cigarettes    Quit date: 11/02/1978    Years since quitting: 44.0   Smokeless tobacco: Never   Tobacco comments:    former smoker 1967-1980 1 pk/week no FH lung cancer   Vaping Use   Vaping Use: Never used  Substance and Sexual Activity   Alcohol use: Yes    Alcohol/week: 0.0 standard drinks of alcohol    Comment: beer/wine on weekends   Drug use: No   Sexual activity: Yes  Other Topics Concern   Not on file  Social History Narrative   Lives with wife for 22yr ([place]   [place]    Divorced, one daughter deceased   Retired [employer] treasurer   Occupation Retired ANurse, mental health  Edu: 1 yr college   Activity: volunteers at  [employer]   Diet: good water, fruits/vegetables daily      Tested at risk for OSA in preop for CABG   Social Determinants of Health   Financial Resource Strain: Low Risk  (10/31/2021)   Overall Financial  Resource Strain (CARDIA)    Difficulty of Paying Living Expenses: Not hard at all  Food Insecurity: No Food Insecurity (10/31/2021)   Hunger Vital Sign    Worried About Running Out of Food in the Last Year: Never true    Ran Out of Food in the Last Year: Never true  Transportation Needs: No Transportation Needs (10/31/2021)   PRAPARE - Hydrologist (Medical): No    Lack of Transportation (Non-Medical): No  Physical Activity: Unknown (08/14/2019)   Exercise Vital Sign    Days of Exercise per Week: 0 days    Minutes of Exercise per Session: Not on file  Stress: No Stress Concern Present (10/31/2021)   [place]    Feeling of Stress : Not at all  Social Connections: Unknown (10/31/2021)   Social Connection and Isolation Panel [NHANES]    Frequency of Communication with Friends and Family: More than three times a week    Frequency of Social Gatherings with Friends and Family: Once a week    Attends Religious Services: Not on Advertising copywriter or Organizations: Not on file    Attends Archivist Meetings: Not on file    Marital Status: Living with partner  Intimate Partner Violence: Not At Risk (10/31/2021)   Humiliation, Afraid, Rape, and Kick questionnaire    Fear of Current or Ex-Partner: No    Emotionally Abused: No    Physically Abused: No    Sexually Abused: No     Review of Systems    General:  No chills, fever, night sweats or weight changes.  Cardiovascular:  No chest pain, dyspnea on exertion, edema, orthopnea, palpitations, paroxysmal nocturnal dyspnea. Dermatological: No rash, lesions/masses Respiratory: No cough, dyspnea Urologic: No hematuria, dysuria Abdominal:   No nausea, vomiting, diarrhea, bright red blood per rectum, melena, or hematemesis Neurologic:  No visual changes, weakness, changes in mental status. All other systems reviewed and are  otherwise negative except as noted above.  Physical Exam    VS:  BP 120/66 (BP Location: Left Arm, Patient Position: Sitting, Cuff Size: Normal)   Pulse 75   Ht 6' (1.829 m)   Wt 203 lb 6.4 oz (92.3 kg)   SpO2 95%   BMI 27.59 kg/m  , BMI Body mass index is 27.59 kg/m. GEN:  Well nourished, well developed, in no acute distress. HEENT: Normal. Neck: Supple, no JVD, carotid bruits, or masses. Cardiac: RRR, no murmurs, rubs, or gallops. No clubbing, cyanosis, edema.  Radials/DP/PT 2+ and equal bilaterally.  Respiratory:  Respirations regular and unlabored, clear to auscultation bilaterally. GI: Soft, nontender, nondistended. MS: No deformity or atrophy. Skin: Warm and dry, no rash. Neuro: Strength and sensation are intact. Psych: Normal affect.  Accessory Clinical Findings    Recent Labs: 12/04/2021: ALT 14; BUN 19; Creatinine, Ser 0.90; Hemoglobin 14.0; Platelets 156.0; Potassium 4.5; Sodium 138; TSH 1.54   Recent Lipid Panel    Component Value Date/Time   CHOL 106 12/04/2021 1124   CHOL 123 01/23/2016 0803   CHOL 155 11/28/2014 0000   CHOL 155 11/28/2014 0000   TRIG 114.0 12/04/2021 1124   TRIG 126 11/28/2014 0000  TRIG 126 11/28/2014 0000   HDL 46.00 12/04/2021 1124   HDL 49 01/23/2016 0803   HDL 48 11/28/2014 0000   CHOLHDL 2 12/04/2021 1124   VLDL 22.8 12/04/2021 1124   LDLCALC 37 12/04/2021 1124   LDLCALC 51 01/23/2016 0803   LDLCALC 82 11/28/2014 0000   LDLCALC 82 11/28/2014 0000       ECG personally reviewed by me today: NSR with nonspecific T-wave abnormality, HR 75.  No significant changes from 10/07/2021.    Assessment & Plan   CAD/ischemic CM. S/p CABG x 08 July 2015. Echo January 2017 showed EF 55-60%, mild LVH, grade I DD. Patient denies chest pain, pressure or tightness. 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 Continue aspirin, Lipitor, Zetia, and  Metoprolol.  Hypertension. BP today 120/66. Patient denies dizziness or headaches. Continue lisinopril and metoprolol.  Hyperlipidemia. LDL February 2023 37, at goal. Continue Lipitor and Zetia. PCP draws annual labs. He has a visit scheduled in February 2024.  Carotid artery disease. [place] May 2023 showed right>left atheroscelortic plaque of the distal common carotid arteries <50%. No carotid bruit auscultated on exam. Continue Lipitor and Zetia.       Disposition: Return in 1 year or sooner as needed.    Justice Britain. Eduardo Honor, DNP, NP-C     10/29/2022, 11:30 AM [facility] [address] Office [phone] Fax [phone]   I spent 10 minutes examining this patient, reviewing medications, and using patient centered shared decision making involving her cardiac care.  Prior to her visit I spent greater than 20 minutes reviewing her past medical history,  medications, and prior cardiac tests.

## 2022-10-28 NOTE — Therapy (Unsigned)
OUTPATIENT PHYSICAL THERAPY THORACOLUMBAR TREATMENT   Patient Name: Shannon Chung MRN: [id number] DOB:1941/02/05, 81 y.o., male Today's Date: 10/28/2022         Past Medical History:  Diagnosis Date   Coronary artery disease    a. 06/2015 Cardiac CT: Ca score 1103 (84th %'ile);  b. 07/2015 Cath: LM 70, LAD 80p, 100/67m D1 70, D2 95, RI 75, RCA 100p/m;  c. 07/2015 CABG x 5 (LIMA->LAD, VG->Diag, VG->OM1->OM2, VG->OM3).   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 dilated, EF 35% improved to 45-50% (2015);  b. 07/2015 EF 25-35% by LV gram.   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 isolated episode, not on [place]   [place] thrombus of cardiac apex    a. 06/2014: LV; resolved with coumadin-->no residual on f/u echo, no longer on coumadin.   Osteoarthritis    a. R-shoulder, L-knee (Sabra Heckortho)   Skin cancer    squamous and basal cell right forearm, SCC left cheek 10/04/20 sees derm regularly Dr. 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   XRT; right vocal cord; had f[place] until 2013 or 2015 [place]   Stroke ([facility]    Thrombocytopenia ([place]    Vitamin D deficiency    Past Surgical History:  Procedure Laterality Date   BICEPS TENDON REPAIR Right 1TravilahN/A 07/05/2015   Procedure: Left Heart Cath and Coronary Angiography;  Surgeon: TMinna Merritts MD;  [facility];  Service:  Cardiovascular;  Laterality: N/A;   CATARACT EXTRACTION Left 12/2016   with keratoplasty   COLONOSCOPY  2007   COLONOSCOPY WITH PROPOFOL N/A 12/02/2017   TA, SSA, rpt 3 yrs(Tahiliani, Varnita B, MD)   COLONOSCOPY WITH PROPOFOL N/A 11/26/2020   Procedure: COLONOSCOPY WITH PROPOFOL;  Surgeon: WLucilla Lame MD;  [facility];  Service: Endoscopy;  Laterality: N/A;   CORONARY ARTERY BYPASS GRAFT N/A 07/29/2015   Procedure: CORONARY ARTERY BYPASS GRAFTING (CABG) x 5 (LIMA to LAD, SVG to DIAGONAL,  SVG SEQUENTIALLY to OM1 and OM2, SVG to OM3) with Endoscopic Vein Hav[place] of  GREATER SAPHENOUS VEIN from RIGHT THIGH and partial LOWER LEG ;  Surgeon: BGaye Pollack MD;  [facility];  Service: Open Heart Surgery;  Laterality: N/A;   EYE SURGERY     b/l cataract and cornea replaced    HAND SURGERY     left hand 1st/2nd trigger fingers Dr. MSabra Heckortho    JOINT REPLACEMENT     KNEE ARTHROSCOPY Left remote   MOHS SURGERY     left cheek scc 2022 Dr. MWinifred Olive  MOHS SURGERY     x 5 facial scc   right biceps tendon     repair/re attachment    SKIN CANCER EXCISION  10/2015  BCC - L ala (pending MOHs) and L scapula (complete excision)   TEE WITHOUT CARDIOVERSION N/A 07/29/2015   Procedure: TRANSESOPHAGEAL ECHOCARDIOGRAM (TEE);  Surgeon: [name], MD;  Location: [place];  Service: Open Heart Surgery;  Laterality: N/A;   TONSILLECTOMY  1949   TOTAL KNEE ARTHROPLASTY Left 03/18/2016   cemented L TKR; Earnestine Leys, MD   Patient Active Problem List   Diagnosis Date Noted   Cerebrovascular accident (CVA) ([place]) 03/11/2022   Cervical spondylosis 03/11/2022   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   Bilateral carotid artery stenosis 03/11/2022   Retention cyst of paranasal sinus 03/11/2022   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   Lumbar spondylosis 10/07/2021   Malignant neoplasm of prostate ([place]) 10/07/2021   DDD (degenerative disc disease), lumbar 10/07/2021   Kidney stones 04/17/2021   Aortic  atherosclerosis ([place]) 04/17/2021   Diverticulosis 04/17/2021   Personal history of colonic polyps    Polyp of colon    Grief 10/07/2020   Adjustment reaction with anxiety and depression 10/07/2020   Hypertension associated with diabetes ([place]) 10/07/2020   Overweight (BMI 25.0-29.9) 10/07/2020   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   Vitamin D deficiency 08/01/2020   Polyp of sigmoid colon 08/01/2020   Anxiety 06/07/2020   Insomnia 06/07/2020   Gastroesophageal reflux disease 04/04/2020   Degenerative joint disease of hand 03/01/2020   Strain of muscle of right hip 08/28/2019   Trigger finger of left hand 07/07/2019   BPH (benign prostatic hyperplasia) 08/05/2018   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   Macular scar of both eyes 04/20/2018   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   Reduced visual acuity 04/20/2018   Scotoma involving central area of both eyes 04/20/2018   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/25/2018   Fatty liver 03/25/2018   PSA elevation 03/25/2018   Thrombocytopenia ([place]) 02/14/2018   Frequent PVCs 02/14/2018   Positive colorectal cancer screening using Cologuard test    Benign neoplasm of cecum    Benign neoplasm of transverse colon    Health maintenance examination 02/23/2017   Erectile dysfunction 02/23/2017   Obesity (BMI 30.0-34.9) 01/25/2017   Hx of CABG 01/24/2017   Medicare annual wellness visit, subsequent 10/21/2015   Advanced care planning/counseling discussion 10/21/2015   Biceps tendinitis 10/10/2015   Coronary artery disease of native artery of native heart with stable angina pectoris ([place])    DM2 (diabetes mellitus, type 2) ([place]) 08/12/2015   History of radiation exposure    Impingement syndrome of shoulder region 05/08/2015   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   Ischemic cardiomyopathy 01/02/2015   Hyperlipidemia 01/02/2015   Essential hypertension 01/02/2015   Osteoarthritis 01/02/2015   Fuchs' corneal  dystrophy 11/02/2014   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  ONSET DATE: 08/21/2022  SUBJECTIVE:  SUBJECTIVE STATEMENT:  Pt reports pain is much improved- rates at a 1/10 and states he is going to see Dr. Rudene Christians next week for follow up for "popping" in his knee   PERTINENT HISTORY:   Patient is a 81 year old male referral for outpatient PT services from orthopedics- complaining of right hip and back pain- history per Dr. Rudene Christians office visit note from 09/02/2022- The patient presents for evaluation of hip and back pain. He had a history of during the weekend having a Halloween walk. It was uneven and in the dark. Since then, he has been having hip pain, also slept in a recliner and has been having significant back pain. It was uneven and in the dark. Since then, he has been having hip pain.  The patient reports that he walked 3 weeks ago. He reports that when he went to the pumpkin wall, he was wearing sketchers and stepped on the trail. It was moving up and down and he was unable finish due to his pain. He states that he walked 8000 steps in 4 hours a day and was pushing cart. He walks by himself for short distances. He reports that his artificial knee here is stable and the other side it wanders. He reports gait is uneven. He states Dr Sabra Heck told him that something was off with his hip. He reports that when he does  exercise, he wears braces for hip, and it helps with his hips. He reports that he wants to try physical therapy. He reports for recreation he walks but do does not walk regularly.   Received trigger point ultrasound guided Bilateral SI joint steroid injection on 09/02/2022- Paitent reports now no pain currently in right hip but experiencing some left buttock pain over past week.   PAIN:  Are you having pain? Yes: NPRS scale: 7/10 Pain location: Left posterior buttock Pain description: Nagging ache- Intermittent Aggravating factors: walking and pushing patients.  Relieving factors: standing,pushing patients   PRECAUTIONS: None  WEIGHT BEARING RESTRICTIONS: No  FALLS:  Has patient fallen in last 6 months? No  LIVING ENVIRONMENT: Lives with: lives with their spouse Lives in: House/apartment Stairs: No Has following equipment at home: None  OCCUPATION: Retired from [employer]. 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  NEXT MD VISIT: None  OBJECTIVE:   DIAGNOSTIC FINDINGS:  Nothing recent in medical notes  PATIENT SURVEYS:  Modified Oswestry 24%   SCREENING FOR RED FLAGS: Bowel or bladder incontinence: No Spinal tumors: No Cauda equina syndrome: No Compression fracture: No Abdominal aneurysm: No  COGNITION: Overall cognitive status: Within functional limits for tasks assessed     SENSATION: WFL  MUSCLE LENGTH: Hamstrings: Right 58 deg; Left 48 deg   POSTURE: No Significant postural limitations  PALPATION: + tenderness along Left proximal posterior thigh  LUMBAR ROM:   AROM eval  Flexion WNL  Extension WNL  Right lateral flexion WNL  Left lateral flexion WNL  Right rotation WNL  Left rotation WNL   (Blank rows = not tested)   LOWER EXTREMITY MMT:    MMT Right eval Left eval  Hip flexion 5 3+  Hip extension 5 4  Hip abduction 5 4  Hip  adduction 5 4  Hip internal rotation 5 5  Hip external rotation 5 5  Knee flexion 5 5  Knee extension 5 4  Ankle dorsiflexion 5 5  Ankle  plantarflexion 5 5  Ankle inversion 5 5  Ankle eversion 5 5   (Blank rows = not tested)  LUMBAR SPECIAL TESTS:  Straight leg raise test: Negative, Slump test: Negative, and FABER test: Negative  FUNCTIONAL TESTS:  5 times sit to stand: 18.3 sec without UE support  GAIT: Distance walked: in clinic (approx 50 feet)  Assistive device utilized: None Level of assistance: Complete Independence Comments: Reciprocal steps   TODAY'S TREATMENT:                                                                                                                              DATE: 10/27/22    Bridging- feet on mat 2 sets of 10 reps   Deadlift using 15lb kettle bell (VC and visual demo for correct form) x 12 reps (no pain or reported difficulty)   Forward lunge squat onto BOSU (minimal knee flex) in // bars with min BUE support x 12 reps alt LE  Golfers lean reaching for cone positioned in front of patient resting on top of 6" block x12 reps each LE (VC for form)  SLS attempted for hip stabilization - patient with difficulty maintaining > 5 sec either side  Step tap onto progressive higher surfaces- 1/2 foam; 6" step block; and then top of cone (10 reps each LE for each object)    PATIENT EDUCATION:  Education details: Purpose of PT and plan of care Person educated: Patient Education method: Explanation, Demonstration, Tactile cues, and Verbal cues Education comprehension: verbalized understanding, returned demonstration, verbal cues required, and needs further education  HOME EXERCISE PROGRAM: Access Code: [email or web address] URL: [email or web address] Date: 10/16/2022 Prepared by: Sande Brothers  Exercises - Supine Piriformis Stretch with Foot on Ground  - 1 x daily - 7 x weekly - 3 sets - 20-30 sec hold - Leg Overs  - 1 x daily - 7 x  weekly - 3 sets - 20-30 sec hold - Supine Figure 4 Piriformis Stretch  - 1 x daily - 7 x weekly - 3 sets - 20-30 hold - Piriformis Mobilization on Foam Roll  - 1 x daily - 7 x weekly - 3 sets - 10 reps     Access Code: [email or web address] URL: [email or web address] Date: 10/08/2022 Prepared by: Sande Brothers  Exercises - Supine Isometric Hamstring Set  - 1 x daily - 3 x weekly - 2-3 sets - 10 reps - 5 hold - Seated Hamstring Set  - 3 x weekly - 3 sets - 10 reps - 5 hold - Seated Heel Slide  - 3 x weekly - 3 sets - 10 reps - Prone Knee Flexion  - 3 x weekly - 3 sets - 10 reps     Access Code: [email or web address] URL: [email or web address] Date: 09/15/2022 Prepared by: Janna Arch  Exercises - Seated Piriformis Stretch  - 1 x daily - 7 x weekly - 2 sets - 2 reps - 30 hold - Seated Piriformis Stretch  -  1 x daily - 7 x weekly - 2 sets - 2 reps - 30 hold - Supine Figure 4 Piriformis Stretch  - 1 x daily - 7 x weekly - 2 sets - 2 reps - 30 hold - Hooklying Single Knee to Chest  - 1 x daily - 7 x weekly - 2 sets - 10 reps - 5 hold -Hamstring stretch in seated and supine position 2 x 30 second holds    ASSESSMENT:  CLINICAL IMPRESSION: Patient presents with minimal pain at beginning of session and no worsening throughout session as he continued with some LE (hip/hamstring strengthening). Treatment focused on mostly closed chain - pain free exercises designed to target the hamstrings and patient responded well as seen by ability to complete all prescribed therex without increased pain or difficulty- other than weakness. Overall pain continues to diminish and  Pt will continue to benefit from skilled physical therapy intervention to address impairments, improve QOL, and attain therapy goals.     OBJECTIVE IMPAIRMENTS: Abnormal gait, decreased activity tolerance, decreased balance, decreased mobility, difficulty walking, impaired perceived functional ability, impaired  flexibility, and pain.   ACTIVITY LIMITATIONS: sitting  PARTICIPATION LIMITATIONS: occupation  PERSONAL FACTORS: 1 comorbidity: Hypertension  are also affecting patient's functional outcome.   REHAB POTENTIAL: Good  CLINICAL DECISION MAKING: Evolving/moderate complexity  EVALUATION COMPLEXITY: Low   GOALS: Goals reviewed with patient? Yes  SHORT TERM GOALS: Target date: 12/01/2022  Pt will be independent with HEP in order to improve strength and decrease back pain in order to improve pain-free function at home and work.  Baseline: EVAL= Patient has no formal HEP in place Goal status: INITIAL   LONG TERM GOALS: Target date: 12/07/22  Pt will improve FOTO to target score of 68 to display perceived improvements in ability to complete ADL's.  Baseline: EVAL = 56 Goal status: INITIAL  2.  Pt will decrease mODI score by at least 13 points in order demonstrate clinically significant reduction in back pain/disability.       Baseline: EVAL= 24 Goal status: INITIAL  3.  Pt will decrease worst back pain as reported on NPRS by at least 2 points in order to demonstrate clinically significant reduction in back pain.  Baseline: EVAL=3/10 Goal status: INITIAL  4.  Pt will decrease 5TSTS by at least 3 seconds in order to demonstrate clinically significant improvement in LE strength.  Baseline: EVAL= 18.88 sec  Goal status: INITIAL    PLAN:  PT FREQUENCY: 1-2x/week  PT DURATION: 12 weeks  PLANNED INTERVENTIONS: Therapeutic exercises, Therapeutic activity, Neuromuscular re-education, Balance training, Gait training, Patient/Family education, Self Care, Joint mobilization, Joint manipulation, Dry Needling, Electrical stimulation, Spinal manipulation, Spinal mobilization, Cryotherapy, Moist heat, and Manual therapy.  PLAN FOR NEXT SESSION: Manual Therapy for pain relief, strengthening of hips and progressive hamstring strengthening pt tolerates. Hip strength as pt pain tolerates.    Particia Lather, PT 10/28/2022, 4:37 PM

## 2022-10-29 ENCOUNTER — Ambulatory Visit: Payer: Medicare Other | Attending: Cardiology | Admitting: Student

## 2022-10-29 ENCOUNTER — Ambulatory Visit: Payer: Medicare Other | Admitting: Physical Therapy

## 2022-10-29 ENCOUNTER — Encounter: Payer: Self-pay | Admitting: Cardiology

## 2022-10-29 VITALS — BP 120/66 | HR 75 | Ht 72.0 in | Wt 203.4 lb

## 2022-10-29 DIAGNOSIS — I255 Ischemic cardiomyopathy: Secondary | ICD-10-CM

## 2022-10-29 DIAGNOSIS — M6281 Muscle weakness (generalized): Secondary | ICD-10-CM | POA: Diagnosis not present

## 2022-10-29 DIAGNOSIS — I6523 Occlusion and stenosis of bilateral carotid arteries: Secondary | ICD-10-CM

## 2022-10-29 DIAGNOSIS — E785 Hyperlipidemia, unspecified: Secondary | ICD-10-CM | POA: Diagnosis not present

## 2022-10-29 DIAGNOSIS — M545 Low back pain, unspecified: Secondary | ICD-10-CM

## 2022-10-29 DIAGNOSIS — I2510 Atherosclerotic heart disease of native coronary artery without angina pectoris: Secondary | ICD-10-CM | POA: Diagnosis not present

## 2022-10-29 DIAGNOSIS — I10 Essential (primary) hypertension: Secondary | ICD-10-CM

## 2022-10-29 DIAGNOSIS — M25552 Pain in left hip: Secondary | ICD-10-CM

## 2022-10-29 DIAGNOSIS — R269 Unspecified abnormalities of gait and mobility: Secondary | ICD-10-CM

## 2022-10-29 NOTE — Patient Instructions (Signed)
Medication Instructions:  Your physician recommends that you continue on your current medications as directed. Please refer to the Current Medication list given to you today.  *If you need a refill on your cardiac medications before your next appointment, please call your pharmacy*   Lab Work: No labs ordered  If you have labs (blood work) drawn today and your tests are completely normal, you will receive your results only by: [place] (if you have MyChart) OR A paper copy in the mail If you have any lab test that is abnormal or we need to change your treatment, we will call you to review the results.   Testing/Procedures: No testing ordered  Follow-Up: At [facility], you and your health needs are our priority.  As part of our continuing mission to provide you with exceptional heart care, we have created designated Provider Care Teams.  These Care Teams include your primary Cardiologist (physician) and Advanced Practice Providers (APPs -  Physician Assistants and Nurse Practitioners) who all work together to provide you with the care you need, when you need it.  We recommend signing up for the patient portal called "MyChart".  Sign up information is provided on this After Visit Summary.  MyChart is used to connect with patients for Virtual Visits (Telemedicine).  Patients are able to view lab/test results, encounter notes, upcoming appointments, etc.  Non-urgent messages can be sent to your provider as well.   To learn more about what you can do with MyChart, go to [email or web address].    Your next appointment:   1 year(s)  The format for your next appointment:   In Person  Provider:   You may see Ida Rogue, MD   Important Information About Sugar

## 2022-11-03 ENCOUNTER — Ambulatory Visit: Payer: Medicare Other | Attending: Orthopedic Surgery | Admitting: Physical Therapy

## 2022-11-03 ENCOUNTER — Encounter: Payer: Self-pay | Admitting: Physical Therapy

## 2022-11-03 DIAGNOSIS — M545 Low back pain, unspecified: Secondary | ICD-10-CM

## 2022-11-03 DIAGNOSIS — M25552 Pain in left hip: Secondary | ICD-10-CM | POA: Diagnosis present

## 2022-11-03 DIAGNOSIS — R269 Unspecified abnormalities of gait and mobility: Secondary | ICD-10-CM | POA: Insufficient documentation

## 2022-11-03 NOTE — Therapy (Signed)
OUTPATIENT PHYSICAL THERAPY THORACOLUMBAR TREATMENT    Patient Name: Shannon Chung MRN: [id number] DOB:1941-08-05, 82 y.o., male Today's Date: 11/03/2022   PT End of Session - 11/03/22 0934     Visit Number 11    Number of Visits 12    Date for PT Re-Evaluation 12/07/22    Progress Note Due on Visit 10    PT Start Time 0932    PT Stop Time 1011    PT Time Calculation (min) 39 min    Equipment Utilized During Treatment Back brace    Activity Tolerance Patient tolerated treatment well;No increased pain    Behavior During Therapy WFL for tasks assessed/performed                  Past Medical History:  Diagnosis Date   Coronary artery disease    a. 06/2015 Cardiac CT: Ca score 1103 (84th %'ile);  b. 07/2015 Cath: LM 70, LAD 80p, 100/66m D1 70, D2 95, RI 75, RCA 100p/m;  c. 07/2015 CABG x 5 (LIMA->LAD, VG->Diag, VG->OM1->OM2, VG->OM3).   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 dilated, EF 35% improved to 45-50% (2015);  b. 07/2015 EF 25-35% by LV gram.   [place] atrial fibrillation [place]   a. isolated episode, not on OMarianna   Mural thrombus of cardiac apex    a. 06/2014: LV; resolved with coumadin-->no residual on f/u echo, no longer on coumadin.   Osteoarthritis    a. R-shoulder, L-knee (Sabra Heckortho)   Skin cancer    squamous and basal cell right forearm, SCC left cheek 10/04/20 sees derm regularly Dr. 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   XRT; right vocal cord; [place]   St[facility] ([facility]    Thrombocytopenia ([place]    Vitamin D deficiency    Past Surgical History:  Procedure Laterality Date   BICEPS TENDON REPAIR Right 1[place]/A 07/05/2015   Procedure: Left Heart Cath and Coronary Angiography;  Surgeon: TMinna Merritts MD;  [facility];  Service: Cardiovascular;  Laterality: N/A;   CATARACT EXTRACTION Left 12/2016   with keratoplasty   COLONOSCOPY  2007   COLONOSCOPY WITH PROPOFOL N/A 12/02/2017   TA, SSA, rpt 3 yrs(Tahiliani, Varnita B, MD)   COLONOSCOPY WITH PROPOFOL N/A 11/26/2020   Procedure: COLONOSCOPY WITH PROPOFOL;  Surgeon: WLucilla Lame MD;  [facility];  Service: Endoscopy;  Laterality: N/A;   CORONARY ARTERY BYPASS GRAFT N/A 07/29/2015   Procedure: CORONARY ARTERY BYPASS GRAFTING (CABG) x 5 (LIMA to LAD, SVG to DIAGONAL,  SVG SEQUENTIALLY to OM1 and OM2, SVG to OM3) with Endoscopic Vein Hav[place] of  GREATER SAPHENOUS VEIN from RIGHT THIGH and partial LOWER LEG ;  Surgeon: BFernande Boyden  Cyndia Bent, MD;  Location: [place] [facility];  Service: Open Heart Surgery;  Laterality: N/A;   EYE SURGERY     b/l cataract and cornea replaced    HAND SURGERY     left hand 1st/2nd trigger fingers Dr. Sabra Heck ortho    JOINT REPLACEMENT     KNEE ARTHROSCOPY Left remote   MOHS SURGERY     left cheek scc 2022 Dr. Winifred Olive   MOHS SURGERY     x 5 facial scc   right biceps tendon     repair/re attachment    SKIN CANCER EXCISION  10/2015   BCC - L ala (pending MOHs) and L scapula (complete excision)   TEE WITHOUT CARDIOVERSION N/A 07/29/2015   Procedure: TRANSESOPHAGEAL ECHOCARDIOGRAM (TEE);  Surgeon: [name], MD;  Location: [place];  Service: Open Heart Surgery;  Laterality: N/A;   TONSILLECTOMY  1949   TOTAL KNEE ARTHROPLASTY Left 03/18/2016   cemented L TKR; Earnestine Leys, MD   Patient Active Problem List    Diagnosis Date Noted   Cerebrovascular accident (CVA) ([place]) 03/11/2022   Cervical spondylosis 03/11/2022   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   Bilateral carotid artery stenosis 03/11/2022   Retention cyst of paranasal sinus 03/11/2022   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   Lumbar spondylosis 10/07/2021   Malignant neoplasm of prostate ([place]) 10/07/2021   DDD (degenerative disc disease), lumbar 10/07/2021   Kidney stones 04/17/2021   Aortic atherosclerosis ([place]) 04/17/2021   Diverticulosis 04/17/2021   Personal history of colonic polyps    Polyp of colon    Grief 10/07/2020   Adjustment reaction with anxiety and depression 10/07/2020   Hypertension associated with diabetes ([place]) 10/07/2020   Overweight (BMI 25.0-29.9) 10/07/2020   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   Vitamin D deficiency 08/01/2020   Polyp of sigmoid colon 08/01/2020   Anxiety 06/07/2020   Insomnia 06/07/2020   Gastroesophageal reflux disease 04/04/2020   Degenerative joint disease of hand 03/01/2020   Strain of muscle of right hip 08/28/2019   Trigger finger of left hand 07/07/2019   BPH (benign prostatic hyperplasia) 08/05/2018   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   Macular scar of both eyes 04/20/2018   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   Reduced visual acuity 04/20/2018   Scotoma involving central area of both eyes 04/20/2018   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/25/2018   Fatty liver 03/25/2018   PSA elevation 03/25/2018   Thrombocytopenia ([place]) 02/14/2018   Frequent PVCs 02/14/2018   Positive colorectal cancer screening using Cologuard test    Benign neoplasm of cecum    Benign neoplasm of transverse colon    Health maintenance examination 02/23/2017   Erectile dysfunction 02/23/2017   Obesity (BMI 30.0-34.9) 01/25/2017   Hx of CABG 01/24/2017   Medicare annual wellness visit, subsequent 10/21/2015   Advanced care planning/counseling discussion 10/21/2015   Biceps tendinitis 10/10/2015    Coronary artery disease of native artery of native heart with stable angina pectoris ([place])    DM2 (diabetes mellitus, type 2) ([place]) 08/12/2015   History of radiation exposure    Impingement syndrome of shoulder region 05/08/2015   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   Ischemic cardiomyopathy 01/02/2015   Hyperlipidemia 01/02/2015   Essential hypertension 01/02/2015   Osteoarthritis 01/02/2015   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/02/2014   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   Squamous cell carcinoma of vocal cord ([place]) 2008    PCP: Orland Mustard, MD  REFERRING PROVIDER: Hessie Knows, MD  REFERRING DIAG:  [phone] (ICD-10-CM) - Spondylosis  without myelopathy or radiculopathy, sacral and sacrococcygeal region  M41.57 (ICD-10-CM) - Other secondary scoliosis, lumbosacral region    Rationale for Evaluation and Treatment: Rehabilitation  THERAPY DIAG:  Pain in left hip  Abnormality of gait and mobility  Bilateral low back pain without sciatica, unspecified chronicity  ONSET DATE: 08/21/2022  SUBJECTIVE:                                                                                                                                                                                           SUBJECTIVE STATEMENT:  Pt reports pain is much improved, still not a lot of pain at the moment.  PERTINENT HISTORY:   Patient is a 82 year old male referral for outpatient PT services from orthopedics- complaining of right hip and back pain- history per Dr. Rudene Christians office visit note from 09/02/2022- The patient presents for evaluation of hip and back pain. He had a history of during the weekend having a Halloween walk. It was uneven and in the dark. Since then, he has been having hip pain, also slept in a recliner and has been having significant back pain. It was uneven and in the dark. Since then, he has been having hip pain.  The patient reports that he walked 3 weeks ago. He reports that when he  went to the pumpkin [place], he was wearing sketchers and stepped on the trail. It was moving up and down and he was unable finish due to his pain. He states that he walked 8000 steps in 4 hours a day and was pushing cart. He walks by himself for short distances. He reports that his artificial knee here is stable and the other side it wanders. He reports gait is uneven. He states Dr Sabra Heck told him that something was off with his hip. He reports that when he does exercise, he wears braces for hip, and it helps with his hips. He reports that he wants to try physical therapy. He reports for recreation he walks but do does not walk regularly.   Received trigger point ultrasound guided Bilateral SI joint steroid injection on 09/02/2022- Paitent reports now no pain currently in right hip but experiencing some left buttock pain over past week.   PAIN:  Are you having pain? Yes: NPRS scale: 7/10 Pain location: Left posterior buttock Pain description: Nagging ache- Intermittent Aggravating factors: walking and pushing patients.  Relieving factors: standing,pushing patients   PRECAUTIONS: None  WEIGHT BEARING RESTRICTIONS: No  FALLS:  Has patient fallen in last 6 months? No  LIVING ENVIRONMENT: Lives with: lives with their spouse Lives in: House/apartment Stairs: No Has following equipment  at home: None  OCCUPATION: Retired from [employer]. 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  NEXT MD VISIT: None  OBJECTIVE:   DIAGNOSTIC FINDINGS:  Nothing recent in medical notes  PATIENT SURVEYS:  Modified Oswestry 24%   SCREENING FOR RED FLAGS: Bowel or bladder incontinence: No Spinal tumors: No Cauda equina syndrome: No Compression fracture: No Abdominal aneurysm: No  COGNITION: Overall cognitive status: Within functional limits for tasks assessed     SENSATION: WFL  MUSCLE  LENGTH: Hamstrings: Right 58 deg; Left 48 deg   POSTURE: No Significant postural limitations  PALPATION: + tenderness along Left proximal posterior thigh  LUMBAR ROM:   AROM eval  Flexion WNL  Extension WNL  Right lateral flexion WNL  Left lateral flexion WNL  Right rotation WNL  Left rotation WNL   (Blank rows = not tested)   LOWER EXTREMITY MMT:    MMT Right eval Left eval  Hip flexion 5 3+  Hip extension 5 4  Hip abduction 5 4  Hip adduction 5 4  Hip internal rotation 5 5  Hip external rotation 5 5  Knee flexion 5 5  Knee extension 5 4  Ankle dorsiflexion 5 5  Ankle plantarflexion 5 5  Ankle inversion 5 5  Ankle eversion 5 5   (Blank rows = not tested)  LUMBAR SPECIAL TESTS:  Straight leg raise test: Negative, Slump test: Negative, and FABER test: Negative  FUNCTIONAL TESTS:  5 times sit to stand: 18.3 sec without UE support  GAIT: Distance walked: in clinic (approx 50 feet)  Assistive device utilized: None Level of assistance: Complete Independence Comments: Reciprocal steps   TODAY'S TREATMENT:                                                                                                                              DATE: 11/03/22    Bridge with ball squeeze adduction 2 x 10   Bridge with hip abduction with Blue TB  2 x 10   LTR 5 x 5 second holds   Prone hamstring curls eccentrics  3 x 10 with 7.5# weight  -assistance with concentric portion then cues for slow eccentric lowering to plinth, difficulty with eccentric control  Standing with UE Support donkey kicks 2 x 10 L LE only   Standing hamstring curl with RTB 2 x 10 L LE only   Standing sidestepping with band with RTB x 10 to ea side, cues for eccentric control     PATIENT EDUCATION:  Education details: Purpose of PT and plan of care Person educated: Patient Education method: Explanation, Demonstration, Tactile cues, and Verbal cues Education comprehension: verbalized  understanding, returned demonstration, verbal cues required, and needs further education  HOME EXERCISE PROGRAM: Access Code: [email or web address] URL: [email or web address] Date: 10/16/2022 Prepared by: Sande Brothers  Exercises - Supine Piriformis Stretch with Foot  on Ground  - 1 x daily - 7 x weekly - 3 sets - 20-30 sec hold - Leg Overs  - 1 x daily - 7 x weekly - 3 sets - 20-30 sec hold - Supine Figure 4 Piriformis Stretch  - 1 x daily - 7 x weekly - 3 sets - 20-30 hold - Piriformis Mobilization on Foam Roll  - 1 x daily - 7 x weekly - 3 sets - 10 reps     Access Code: [email or web address] URL: [email or web address] Date: 10/08/2022 Prepared by: Sande Brothers  Exercises - Supine Isometric Hamstring Set  - 1 x daily - 3 x weekly - 2-3 sets - 10 reps - 5 hold - Seated Hamstring Set  - 3 x weekly - 3 sets - 10 reps - 5 hold - Seated Heel Slide  - 3 x weekly - 3 sets - 10 reps - Prone Knee Flexion  - 3 x weekly - 3 sets - 10 reps     Access Code: [email or web address] URL: [email or web address] Date: 09/15/2022 Prepared by: Janna Arch  Exercises - Seated Piriformis Stretch  - 1 x daily - 7 x weekly - 2 sets - 2 reps - 30 hold - Seated Piriformis Stretch  - 1 x daily - 7 x weekly - 2 sets - 2 reps - 30 hold - Supine Figure 4 Piriformis Stretch  - 1 x daily - 7 x weekly - 2 sets - 2 reps - 30 hold - Hooklying Single Knee to Chest  - 1 x daily - 7 x weekly - 2 sets - 10 reps - 5 hold -Hamstring stretch in seated and supine position 2 x 30 second holds    ASSESSMENT:  CLINICAL IMPRESSION: Pt presents to PT with good motivation for completion of PT interventions. Pt progresses with Hamstring strengthening and hip strength. Pt had no pain with interventions and was able to progress well with all of them without pain. Pt will continue to benefit from skilled physical therapy intervention to address impairments, improve QOL, and attain therapy goals.       OBJECTIVE IMPAIRMENTS: Abnormal gait, decreased activity tolerance, decreased balance, decreased mobility, difficulty walking, impaired perceived functional ability, impaired flexibility, and pain.   ACTIVITY LIMITATIONS: sitting  PARTICIPATION LIMITATIONS: occupation  PERSONAL FACTORS: 1 comorbidity: Hypertension  are also affecting patient's functional outcome.   REHAB POTENTIAL: Good  CLINICAL DECISION MAKING: Evolving/moderate complexity  EVALUATION COMPLEXITY: Low   GOALS: Goals reviewed with patient? Yes  SHORT TERM GOALS: Target date: 12/01/2022  Pt will be independent with HEP in order to improve strength and decrease back pain in order to improve pain-free function at home and work.  Baseline: EVAL= Patient has no formal HEP in place 12/28: HEP in place, performing regularly, has printouts.  Goal status: MET   LONG TERM GOALS: Target date: 12/07/22  Pt will improve FOTO to target score of 68 to display perceived improvements in ability to complete ADL's.  Baseline: EVAL = 56 12/27:57 Goal status: INITIAL  2.  Pt will decrease MODI score by at least 13 points in order demonstrate clinically significant reduction in back pain/disability.       Baseline: EVAL= 24, 12/28: 32% Goal status: INITIAL  3.  Pt will decrease worst back pain as reported on NPRS by at least 2 points in order to demonstrate clinically significant reduction in back pain.  Baseline: EVAL=3/10 12/28: No pain in leg/hamstring, 4/10 soreness for low back following PT Goal status: INITIAL  4.  Pt will decrease 5TSTS by at least 3 seconds in order to demonstrate clinically significant improvement in LE strength.  Baseline: EVAL= 18.88 sec 12/28:15.05 sec Goal status: MET    PLAN:  PT FREQUENCY: 1-2x/week  PT DURATION: 12 weeks  PLANNED INTERVENTIONS: Therapeutic exercises, Therapeutic activity, Neuromuscular re-education, Balance training, Gait training, Patient/Family education, Self  Care, Joint mobilization, Joint manipulation, Dry Needling, Electrical stimulation, Spinal manipulation, Spinal mobilization, Cryotherapy, Moist heat, and Manual therapy.  PLAN FOR NEXT SESSION: Hamstring, hip, and core strengthening as indicated, address pain as needed   Particia Lather, PT 11/03/2022, 10:44 AM

## 2022-11-04 ENCOUNTER — Encounter: Payer: Medicare Other | Admitting: Physical Therapy

## 2022-11-05 ENCOUNTER — Ambulatory Visit: Payer: Medicare Other | Attending: Orthopedic Surgery | Admitting: Physical Therapy

## 2022-11-05 DIAGNOSIS — M6281 Muscle weakness (generalized): Secondary | ICD-10-CM | POA: Insufficient documentation

## 2022-11-05 DIAGNOSIS — M545 Low back pain, unspecified: Secondary | ICD-10-CM | POA: Insufficient documentation

## 2022-11-05 DIAGNOSIS — R269 Unspecified abnormalities of gait and mobility: Secondary | ICD-10-CM | POA: Diagnosis present

## 2022-11-05 DIAGNOSIS — M25552 Pain in left hip: Secondary | ICD-10-CM | POA: Insufficient documentation

## 2022-11-05 NOTE — Therapy (Signed)
OUTPATIENT PHYSICAL THERAPY THORACOLUMBAR TREATMENT /DISCHARGE    Patient Name: Shannon Chung MRN: [id number] DOB:Sep 13, 1941, 82 y.o., male Today's Date: 11/06/2022   PT End of Session - 11/05/22 1553     Visit Number 12    Number of Visits 12    Date for PT Re-Evaluation 12/07/22    Progress Note Due on Visit 20    PT Start Time 1600    PT Stop Time 1642    PT Time Calculation (min) 42 min    Equipment Utilized During Treatment Back brace    Activity Tolerance Patient tolerated treatment well;No increased pain    Behavior During Therapy WFL for tasks assessed/performed                  Past Medical History:  Diagnosis Date   Coronary artery disease    a. 06/2015 Cardiac CT: Ca score 1103 (84th %'ile);  b. 07/2015 Cath: LM 70, LAD 80p, 100/2m D1 70, D2 95, RI 75, RCA 100p/m;  c. 07/2015 CABG x 5 (LIMA->LAD, VG->Diag, VG->OM1->OM2, VG->OM3).   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 dilated, EF 35% improved to 45-50% (2015);  b. 07/2015 EF 25-35% by LV gram.   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 isolated episode, not on OPine   Mural thrombus of cardiac apex    a. 06/2014: LV; resolved with coumadin-->no residual on f/u echo, no longer on coumadin.   Osteoarthritis    a. R-shoulder, L-knee ([place])   Skin cancer    squamous and basal cell right forearm, SCC left cheek 10/04/20 sees derm regularly Dr. 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   XRT; right vocal cord; [place] 2013 [place]   Stroke ([facility]    Thrombocytopenia ([place]    Vitamin D deficiency    Past Surgical History:  Procedure Laterality Date   BICEPS TENDON REPAIR Right [place] 07/05/2015   Procedure: Left Heart Cath and Coronary Angiography;  Surgeon: TMinna Merritts MD;  [facility];  Service: Cardiovascular;  Laterality: N/A;   CATARACT EXTRACTION Left 12/2016   with keratoplasty   COLONOSCOPY  2007   COLONOSCOPY WITH PROPOFOL N/A 12/02/2017   TA, SSA, rpt 3 yrs(Tahiliani, Varnita B, MD)   COLONOSCOPY WITH PROPOFOL N/A 11/26/2020   Procedure: COLONOSCOPY WITH PROPOFOL;  Surgeon: WLucilla Lame MD;  [facility];  Service: Endoscopy;  Laterality: N/A;   CORONARY ARTERY BYPASS GRAFT N/A 07/29/2015   Procedure: CORONARY ARTERY BYPASS GRAFTING (CABG) x 5 (LIMA to LAD, SVG to DIAGONAL,  SVG SEQUENTIALLY to OM1 and OM2, SVG to OM3) with Endoscopic Vein Hav[place] of  GREATER SAPHENOUS VEIN from RIGHT THIGH and partial LOWER LEG ;  Surgeon: BGaspar Bidding  Alveria Apley, MD;  Location: [place];  Service: Open Heart Surgery;  Laterality: N/A;   EYE SURGERY     b/l cataract and cornea replaced    HAND SURGERY     left hand 1st/2nd trigger fingers Dr. Sabra Heck ortho    JOINT REPLACEMENT     KNEE ARTHROSCOPY Left remote   MOHS SURGERY     left cheek scc 2022 Dr. Winifred Olive   MOHS SURGERY     x 5 facial scc   right biceps tendon     repair/re attachment    SKIN CANCER EXCISION  10/2015   BCC - L ala (pending MOHs) and L scapula (complete excision)   TEE WITHOUT CARDIOVERSION N/A 07/29/2015   Procedure: TRANSESOPHAGEAL ECHOCARDIOGRAM (TEE);  Surgeon: [name], MD;  Location: [place];  Service: Open Heart Surgery;  Laterality: N/A;   TONSILLECTOMY  1949   TOTAL KNEE ARTHROPLASTY Left 03/18/2016   cemented L TKR; Earnestine Leys, MD   Patient Active Problem List    Diagnosis Date Noted   Cerebrovascular accident (CVA) ([place]) 03/11/2022   Cervical spondylosis 03/11/2022   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9) 10/07/2020   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   Vitamin D deficiency 08/01/2020   Polyp of sigmoid colon 08/01/2020   Anxiety 06/07/2020   Insomnia 06/07/2020   Gastroesophageal reflux disease 04/04/2020   Degenerative joint disease of hand 03/01/2020   Strain of muscle of right hip 08/28/2019   Trigger finger of left hand 07/07/2019   BPH (benign prostatic hyperplasia) 08/05/2018   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   Macular scar of both eyes 04/20/2018   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   Reduced visual acuity 04/20/2018   Scotoma involving central area of both eyes 04/20/2018   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/25/2018   Fatty liver 03/25/2018   PSA elevation 03/25/2018   Thrombocytopenia ([place]) 02/14/2018   Frequent PVCs 02/14/2018   Positive colorectal cancer screening using Cologuard test    Benign neoplasm of cecum    Benign neoplasm of transverse colon    Health maintenance examination 02/23/2017   Erectile dysfunction 02/23/2017   Obesity (BMI 30.0-34.9) 01/25/2017   Hx of CABG 01/24/2017   Medicare annual wellness visit, subsequent 10/21/2015   Advanced care planning/counseling discussion 10/21/2015   Biceps tendinitis 10/10/2015    Coronary artery disease of native artery of native heart with stable angina pectoris ([place])    DM2 (diabetes mellitus, type 2) ([place]) 08/12/2015   History of radiation exposure    Impingement syndrome of shoulder region 05/08/2015   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   Ischemic cardiomyopathy 01/02/2015   Hyperlipidemia 01/02/2015   Essential hypertension 01/02/2015   Osteoarthritis 01/02/2015   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/02/2014   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    PCP: Orland Mustard, MD  REFERRING PROVIDER: Hessie Knows, MD  REFERRING DIAG:  [phone] (ICD-10-CM) -  Spondylosis without myelopathy or radiculopathy, sacral and sacrococcygeal region  M41.57 (ICD-10-CM) - Other secondary scoliosis, lumbosacral region    Rationale for Evaluation and Treatment: Rehabilitation  THERAPY DIAG:  Pain in left hip  Abnormality of gait and mobility  Bilateral low back pain without sciatica, unspecified chronicity  Muscle weakness (generalized)  ONSET DATE: 08/21/2022  SUBJECTIVE:                                                                                                                                                                                           SUBJECTIVE STATEMENT:  Pt reports pain is much improved, is very happy with where he is regarding his back as well as hamstring pain and feels he is completely healed at this time.  Patient very appreciative of the services PT has provided as well as the targeted therapeutic interventions that we have provided with that have been beneficial.  Patient reports visit with his doctor for his knee went well and reports all is well with his artificial knee joint at this time. PERTINENT HISTORY:   Patient is a 82 year old male referral for outpatient PT services from orthopedics- complaining of right hip and back pain- history per Dr. Rudene Christians office visit note from 09/02/2022-  The patient presents for evaluation of hip and back pain. He had a history of during the weekend having a Halloween walk. It was uneven and in the dark. Since then, he has been having hip pain, also slept in a recliner and has been having significant back pain. It was uneven and in the dark. Since then, he has been having hip pain.  The patient reports that he walked 3 weeks ago. He reports that when he went to the pumpkin wall, he was wearing sketchers and stepped on the trail. It was moving up and down and he was unable finish due to his pain. He states that he walked 8000 steps in 4 hours a day and was pushing cart. He walks by himself for short distances. He reports that his artificial knee here is stable and the other side it wanders. He reports gait is uneven. He states Dr Sabra Heck told him that something was off with his hip. He reports that when he does exercise, he wears braces for hip, and it helps with his hips. He reports that he wants to try physical therapy. He reports for recreation he walks but do does not walk regularly.   Received trigger point ultrasound guided Bilateral SI joint steroid injection on 09/02/2022- Paitent reports now no pain currently in right hip but experiencing some left buttock pain over past week.  PAIN:  Are you having pain? Yes: NPRS scale: 7/10 Pain location: Left posterior buttock Pain description: Nagging ache- Intermittent Aggravating factors: walking and pushing patients.  Relieving factors: standing,pushing patients   PRECAUTIONS: None  WEIGHT BEARING RESTRICTIONS: No  FALLS:  Has patient fallen in last 6 months? No  LIVING ENVIRONMENT: Lives with: lives with their spouse Lives in: House/apartment Stairs: No Has following equipment at home: None  OCCUPATION: Retired from [employer]. 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  NEXT MD  VISIT: None  OBJECTIVE:   DIAGNOSTIC FINDINGS:  Nothing recent in medical notes  PATIENT SURVEYS:  Modified Oswestry 24%   SCREENING FOR RED FLAGS: Bowel or bladder incontinence: No Spinal tumors: No Cauda equina syndrome: No Compression fracture: No Abdominal aneurysm: No  COGNITION: Overall cognitive status: Within functional limits for tasks assessed     SENSATION: WFL  MUSCLE LENGTH: Hamstrings: Right 58 deg; Left 48 deg   POSTURE: No Significant postural limitations  PALPATION: + tenderness along Left proximal posterior thigh  LUMBAR ROM:   AROM eval  Flexion WNL  Extension WNL  Right lateral flexion WNL  Left lateral flexion WNL  Right rotation WNL  Left rotation WNL   (Blank rows = not tested)   LOWER EXTREMITY MMT:    MMT Right eval Left eval  Hip flexion 5 3+  Hip extension 5 4  Hip abduction 5 4  Hip adduction 5 4  Hip internal rotation 5 5  Hip external rotation 5 5  Knee flexion 5 5  Knee extension 5 4  Ankle dorsiflexion 5 5  Ankle plantarflexion 5 5  Ankle inversion 5 5  Ankle eversion 5 5   (Blank rows = not tested)  LUMBAR SPECIAL TESTS:  Straight leg raise test: Negative, Slump test: Negative, and FABER test: Negative  FUNCTIONAL TESTS:  5 times sit to stand: 18.3 sec without UE support  GAIT: Distance walked: in clinic (approx 50 feet)  Assistive device utilized: None Level of assistance: Complete Independence Comments: Reciprocal steps   TODAY'S TREATMENT:                                                                                                                              DATE: 11/06/22  Bridge with ball squeeze adduction 2 x 10   Bridge with hip abduction with Blue TB  2 x 10   LTR 5 x 5 second holds   Prone hamstring curls eccentrics -2 x 10 eccentrics with 10# weight  -assistance with concentric portion then cues for slow eccentric lowering to plinth, difficulty with eccentric control  Standing with  UE Support donkey kicks 2 x 10 L LE only   Standing hamstring curl with RTB 2 x 10 ea LE  Standing sidestepping with band with RTB x 10 to ea side, cues for eccentric control  PATIENT EDUCATION:  Education details: Purpose of PT and plan of care Person educated: Patient Education method: Explanation, Demonstration, Tactile cues, and Verbal cues Education comprehension: verbalized understanding, returned demonstration, verbal cues required, and needs further education  HOME EXERCISE PROGRAM: Access Code: [email or web address] URL: [email or web address] Date: 10/16/2022 Prepared by: Sande Brothers  Exercises - Supine Piriformis Stretch with Foot on Ground  - 1 x daily - 7 x weekly - 3 sets - 20-30 sec hold - Leg Overs  - 1 x daily - 7 x weekly - 3 sets - 20-30 sec hold - Supine Figure 4 Piriformis Stretch  - 1 x daily - 7 x weekly - 3 sets - 20-30 hold - Piriformis Mobilization on Foam Roll  - 1 x daily - 7 x weekly - 3 sets - 10 reps     Access Code: [email or web address] URL: [email or web address] Date: 10/08/2022 Prepared by: Sande Brothers  Exercises - Supine Isometric Hamstring Set  - 1 x daily - 3 x weekly - 2-3 sets - 10 reps - 5 hold - Seated Hamstring Set  - 3 x weekly - 3 sets - 10 reps - 5 hold - Seated Heel Slide  - 3 x weekly - 3 sets - 10 reps - Prone Knee Flexion  - 3 x weekly - 3 sets - 10 reps     Access Code: [email or web address] URL: [email or web address] Date: 09/15/2022 Prepared by: Janna Arch  Exercises - Seated Piriformis Stretch  - 1 x daily - 7 x weekly - 2 sets - 2 reps - 30 hold - Seated Piriformis Stretch  - 1 x daily - 7 x weekly - 2 sets - 2 reps - 30 hold - Supine Figure 4 Piriformis Stretch  - 1 x daily - 7 x weekly - 2 sets - 2 reps - 30 hold - Hooklying Single Knee to Chest  - 1 x daily - 7 x weekly - 2 sets - 10 reps - 5 hold -Hamstring stretch in seated and supine position 2 x 30 second holds     ASSESSMENT:  CLINICAL IMPRESSION:  Pt presents to PT at end of initial visit cert amount. Pt has made great progress with PT and is happy with where is is going forward. Pt comfortable with HEP and has met or nearly met all of his therapy goals at this time. Pt will be discharged with HEP at this time with all questions answered. Pt comfortable and happy with this plan and the care he has received during his time at this clinic.      OBJECTIVE IMPAIRMENTS: Abnormal gait, decreased activity tolerance, decreased balance, decreased mobility, difficulty walking, impaired perceived functional ability, impaired flexibility, and pain.   ACTIVITY LIMITATIONS: sitting  PARTICIPATION LIMITATIONS: occupation  PERSONAL FACTORS: 1 comorbidity: Hypertension  are also affecting patient's functional outcome.   REHAB POTENTIAL: Good  CLINICAL DECISION MAKING: Evolving/moderate complexity  EVALUATION COMPLEXITY: Low   GOALS: Goals reviewed with patient? Yes  SHORT TERM GOALS: Target date: 12/01/2022  Pt will be independent with HEP in order to improve strength and decrease back pain in order to improve pain-free function at home and work.  Baseline: EVAL= Patient has no formal HEP in place 12/28: HEP in place, performing regularly, has printouts.  Goal status: MET   LONG TERM GOALS: Target date: 12/07/22  Pt will improve FOTO to target score of 68 to display perceived improvements in ability to complete ADL's.  Baseline: EVAL = 56 12/27:57 11/05/22: 70% Goal status: MET  2.  Pt will decrease MODI score by at least 13 points in order demonstrate clinically significant reduction in back pain/disability.       Baseline: EVAL= 24, 12/28: 32% Goal status: NOT MET but did improve   3.  Pt will decrease worst back pain as reported on NPRS by at least 2 points in order to demonstrate clinically significant reduction in back pain.  Baseline: EVAL=3/10 12/28: No pain in leg/hamstring, 4/10 soreness  for low back following PT 1/4: 1-2 with mostly 1/10 with no leg/ hamstring pain noted  Goal status: MET  4.  Pt will decrease 5TSTS by at least 3 seconds in order to demonstrate clinically significant improvement in LE strength.  Baseline: EVAL= 18.88 sec 12/28:15.05 sec Goal status: MET    PLAN:  PT FREQUENCY: 1-2x/week  PT DURATION: 12 weeks  PLANNED INTERVENTIONS: Therapeutic exercises, Therapeutic activity, Neuromuscular re-education, Balance training, Gait training, Patient/Family education, Self Care, Joint mobilization, Joint manipulation, Dry Needling, Electrical stimulation, Spinal manipulation, Spinal mobilization, Cryotherapy, Moist heat, and Manual therapy.  PLAN FOR NEXT SESSION: D/C this date with HEP in place  Particia Lather, PT 11/06/2022, 8:06 AM

## 2022-11-06 ENCOUNTER — Encounter: Payer: Self-pay | Admitting: Physical Therapy

## 2022-12-17 ENCOUNTER — Encounter: Payer: Medicare Other | Admitting: Physical Therapy

## 2022-12-22 ENCOUNTER — Encounter: Payer: Medicare Other | Admitting: Physical Therapy

## 2022-12-22 NOTE — Patient Instructions (Incomplete)
It was a pleasure meeting you today. Thank you for allowing me to take part in your health care.  Our goals for today as we discussed include:  Schedule annual physical for August Schedule lab appointment 1 week prior to physical.  Please fast for 10 hours prior to lab appointment.  Please schedule Medicare annual wellness visit Tuesday or Thursday afternoon [place]  If you have any questions or concerns, please do not hesitate to call the office at [phone].  I look forward to our next visit and until then take care and stay safe.  Regards,   Carollee Leitz, MD   [facility]

## 2022-12-22 NOTE — Progress Notes (Signed)
   SUBJECTIVE:   Chief Complaint  Patient presents with   Transitions Of Care   HPI Patient presents to clinic to transfer care  No acute concerns today  DM Type 2 Asymptomatic.  Compliant with Metformin 500 mg twice a day.   Doesn't check CBG's at home.  HTN Asymptomatic.  Compliant with Metoprolol XL 25 mg daily.  HLD On Atorvastatin 40 mg daily and Zetia 10 mg daily. Tolerating well and no myalgias.  PERTINENT PMH / PSH: DM Type 2 HTN BPH   OBJECTIVE:  BP 122/78   Pulse 66   Temp 98.4 F (36.9 C) (Oral)   Ht 6' (1.829 m)   Wt 205 lb 3.2 oz (93.1 kg)   SpO2 97%   BMI 27.83 kg/m    Physical Exam Vitals reviewed.  Constitutional:      General: He is not in acute distress.    Appearance: He is normal weight. He is not ill-appearing.  HENT:     Head: Normocephalic.  Eyes:     Conjunctiva/sclera: Conjunctivae normal.  Neck:     Thyroid: No thyromegaly or thyroid tenderness.  Cardiovascular:     Rate and Rhythm: Normal rate and regular rhythm.     Pulses: Normal pulses.  Pulmonary:     Effort: Pulmonary effort is normal.     Breath sounds: Normal breath sounds.  Abdominal:     General: Bowel sounds are normal.  Neurological:     Mental Status: He is alert. Mental status is at baseline.  Psychiatric:        Mood and Affect: Mood normal.        Behavior: Behavior normal.        Thought Content: Thought content normal.        Judgment: Judgment normal.     ASSESSMENT/PLAN:  Hypertension associated with diabetes ([place]) Assessment & Plan: Chronic. Well controlled Continue Lisinopril 10 mg daily   Orders: -     CBC with Differential/Platelet; Future -     Comprehensive metabolic panel; Future -     TSH; Future -     VITAMIN D 25 Hydroxy (Vit-D Deficiency, Fractures); Future  Mixed hyperlipidemia Assessment & Plan: Chronic. Stable. Tolerating statin and no myalgias Continue Lipitor 40 mg daily Continue Zetia 10 mg daily  Orders: -     Lipid  panel; Future  Aortic atherosclerosis ([place]) Assessment & Plan: Noted on CT abd/plelvis 04/2021 On statin therapy and tolerating well. Checking lipids    Coronary artery disease of native artery of native heart with stable angina pectoris [facility]) Assessment & Plan: Chronic. Stable.  Asymptomatic On statin, zetia and ASA Blood pressure goal <130/80 Follows with Cardiology   Bilateral carotid artery stenosis Assessment & Plan: Chronic. Symptomatic. Mild plaque build up Right > Left. < 50% stenosis, noted on ultrasound 05/23 Following with Neurology     Type 2 diabetes mellitus with other specified complication, without long-term current use of insulin ([place]) Assessment & Plan: Chronic. Stable Check A1c, urine ACR today Continue Metformin 500 mg BID On ACEi, statin, and ASA Foot exam today Eye exam, utd  Orders: -     Hemoglobin A1c; Future -     Microalbumin / creatinine urine ratio; Future -     Vitamin B12; Future   PDMP reviewed  Return in about 6 months (around 06/23/2023) for annual visit with fasting labs 1 week prior.  Carollee Leitz, MD

## 2022-12-23 ENCOUNTER — Other Ambulatory Visit: Payer: Self-pay | Admitting: Cardiovascular Disease

## 2022-12-23 ENCOUNTER — Encounter: Payer: Self-pay | Admitting: Family Medicine

## 2022-12-23 ENCOUNTER — Ambulatory Visit: Payer: Medicare Other | Admitting: Family Medicine

## 2022-12-23 VITALS — BP 122/78 | HR 66 | Temp 98.4°F | Ht 72.0 in | Wt 205.2 lb

## 2022-12-23 DIAGNOSIS — I25118 Atherosclerotic heart disease of native coronary artery with other forms of angina pectoris: Secondary | ICD-10-CM

## 2022-12-23 DIAGNOSIS — M25519 Pain in unspecified shoulder: Secondary | ICD-10-CM

## 2022-12-23 DIAGNOSIS — I700 Atherosclerosis of aorta: Secondary | ICD-10-CM

## 2022-12-23 DIAGNOSIS — E1169 Type 2 diabetes mellitus with other specified complication: Secondary | ICD-10-CM

## 2022-12-23 DIAGNOSIS — C139 Malignant neoplasm of hypopharynx, unspecified: Secondary | ICD-10-CM | POA: Insufficient documentation

## 2022-12-23 DIAGNOSIS — I152 Hypertension secondary to endocrine disorders: Secondary | ICD-10-CM

## 2022-12-23 DIAGNOSIS — E782 Mixed hyperlipidemia: Secondary | ICD-10-CM | POA: Diagnosis not present

## 2022-12-23 DIAGNOSIS — E1159 Type 2 diabetes mellitus with other circulatory complications: Secondary | ICD-10-CM | POA: Diagnosis not present

## 2022-12-23 DIAGNOSIS — I6523 Occlusion and stenosis of bilateral carotid arteries: Secondary | ICD-10-CM

## 2022-12-23 DIAGNOSIS — H53419 Scotoma involving central area, unspecified eye: Secondary | ICD-10-CM | POA: Insufficient documentation

## 2022-12-23 HISTORY — DX: Scotoma involving central area, unspecified eye: H53.419

## 2022-12-23 HISTORY — DX: Pain in unspecified shoulder: M25.519

## 2022-12-24 ENCOUNTER — Encounter: Payer: Medicare Other | Admitting: Physical Therapy

## 2022-12-27 ENCOUNTER — Encounter: Payer: Self-pay | Admitting: Family Medicine

## 2022-12-27 NOTE — Assessment & Plan Note (Signed)
Chronic. Well controlled Continue Lisinopril 10 mg daily

## 2022-12-27 NOTE — Assessment & Plan Note (Signed)
Chronic. Stable.  Asymptomatic On statin, zetia and ASA Blood pressure goal <130/80 Follows with Cardiology

## 2022-12-27 NOTE — Assessment & Plan Note (Signed)
Noted on CT abd/plelvis 04/2021 On statin therapy and tolerating well. Checking lipids

## 2022-12-27 NOTE — Assessment & Plan Note (Signed)
Chronic. Stable Check A1c, urine ACR today Continue Metformin 500 mg BID On ACEi, statin, and ASA Foot exam today Eye exam, utd

## 2022-12-27 NOTE — Assessment & Plan Note (Signed)
Chronic. Stable. Tolerating statin and no myalgias Continue Lipitor 40 mg daily Continue Zetia 10 mg daily

## 2022-12-27 NOTE — Assessment & Plan Note (Signed)
Chronic. Symptomatic. Mild plaque build up Right > Left. < 50% stenosis, noted on ultrasound 05/23 Following with Neurology

## 2023-01-05 ENCOUNTER — Encounter: Payer: Self-pay | Admitting: Family Medicine

## 2023-01-05 ENCOUNTER — Encounter: Payer: Self-pay | Admitting: Cardiovascular Disease

## 2023-01-05 MED ORDER — EZETIMIBE 10 MG PO TABS: 10.0000 mg | ORAL_TABLET | Freq: Every day | ORAL | 3 refills | Status: DC

## 2023-02-03 LAB — HM DIABETES EYE EXAM

## 2023-02-22 ENCOUNTER — Emergency Department: Payer: Medicare Other

## 2023-02-22 ENCOUNTER — Other Ambulatory Visit: Payer: Self-pay

## 2023-02-22 ENCOUNTER — Inpatient Hospital Stay: Payer: Medicare Other

## 2023-02-22 ENCOUNTER — Telehealth: Payer: Self-pay | Admitting: Family Medicine

## 2023-02-22 ENCOUNTER — Inpatient Hospital Stay
Admission: EM | Admit: 2023-02-22 | Discharge: 2023-02-23 | DRG: 062 | Disposition: A | Discharge status: Home / Self Care | Payer: Medicare Other | Attending: Osteopathic Medicine | Admitting: Osteopathic Medicine

## 2023-02-22 ENCOUNTER — Encounter: Payer: Self-pay | Admitting: Emergency Medicine

## 2023-02-22 DIAGNOSIS — I69351 Hemiplegia and hemiparesis following cerebral infarction affecting right dominant side: Secondary | ICD-10-CM | POA: Diagnosis present

## 2023-02-22 DIAGNOSIS — I2510 Atherosclerotic heart disease of native coronary artery without angina pectoris: Secondary | ICD-10-CM | POA: Diagnosis present

## 2023-02-22 DIAGNOSIS — I6389 Other cerebral infarction: Secondary | ICD-10-CM | POA: Diagnosis present

## 2023-02-22 DIAGNOSIS — Z79899 Other long term (current) drug therapy: Secondary | ICD-10-CM

## 2023-02-22 DIAGNOSIS — Z8616 Personal history of COVID-19: Secondary | ICD-10-CM

## 2023-02-22 DIAGNOSIS — I639 Cerebral infarction, unspecified: Secondary | ICD-10-CM | POA: Diagnosis not present

## 2023-02-22 DIAGNOSIS — Z7984 Long term (current) use of oral hypoglycemic drugs: Secondary | ICD-10-CM | POA: Diagnosis not present

## 2023-02-22 DIAGNOSIS — Z951 Presence of aortocoronary bypass graft: Secondary | ICD-10-CM

## 2023-02-22 DIAGNOSIS — Z87891 Personal history of nicotine dependence: Secondary | ICD-10-CM

## 2023-02-22 DIAGNOSIS — I255 Ischemic cardiomyopathy: Secondary | ICD-10-CM | POA: Diagnosis present

## 2023-02-22 DIAGNOSIS — Z8521 Personal history of malignant neoplasm of larynx: Secondary | ICD-10-CM

## 2023-02-22 DIAGNOSIS — K219 Gastro-esophageal reflux disease without esophagitis: Secondary | ICD-10-CM | POA: Diagnosis present

## 2023-02-22 DIAGNOSIS — E119 Type 2 diabetes mellitus without complications: Secondary | ICD-10-CM | POA: Diagnosis present

## 2023-02-22 DIAGNOSIS — E559 Vitamin D deficiency, unspecified: Secondary | ICD-10-CM | POA: Diagnosis present

## 2023-02-22 DIAGNOSIS — I10 Essential (primary) hypertension: Secondary | ICD-10-CM | POA: Diagnosis present

## 2023-02-22 DIAGNOSIS — Z96652 Presence of left artificial knee joint: Secondary | ICD-10-CM | POA: Diagnosis present

## 2023-02-22 DIAGNOSIS — G8191 Hemiplegia, unspecified affecting right dominant side: Secondary | ICD-10-CM | POA: Diagnosis present

## 2023-02-22 DIAGNOSIS — Z8546 Personal history of malignant neoplasm of prostate: Secondary | ICD-10-CM

## 2023-02-22 DIAGNOSIS — I252 Old myocardial infarction: Secondary | ICD-10-CM | POA: Diagnosis not present

## 2023-02-22 DIAGNOSIS — Z85828 Personal history of other malignant neoplasm of skin: Secondary | ICD-10-CM

## 2023-02-22 DIAGNOSIS — Z7982 Long term (current) use of aspirin: Secondary | ICD-10-CM

## 2023-02-22 DIAGNOSIS — Z808 Family history of malignant neoplasm of other organs or systems: Secondary | ICD-10-CM | POA: Diagnosis not present

## 2023-02-22 DIAGNOSIS — Z811 Family history of alcohol abuse and dependence: Secondary | ICD-10-CM | POA: Diagnosis not present

## 2023-02-22 DIAGNOSIS — Z833 Family history of diabetes mellitus: Secondary | ICD-10-CM | POA: Diagnosis not present

## 2023-02-22 DIAGNOSIS — Z8249 Family history of ischemic heart disease and other diseases of the circulatory system: Secondary | ICD-10-CM

## 2023-02-22 DIAGNOSIS — N529 Male erectile dysfunction, unspecified: Secondary | ICD-10-CM

## 2023-02-22 DIAGNOSIS — R4701 Aphasia: Secondary | ICD-10-CM | POA: Diagnosis present

## 2023-02-22 DIAGNOSIS — R29703 NIHSS score 3: Secondary | ICD-10-CM | POA: Diagnosis present

## 2023-02-22 DIAGNOSIS — Z83438 Family history of other disorder of lipoprotein metabolism and other lipidemia: Secondary | ICD-10-CM

## 2023-02-22 DIAGNOSIS — E785 Hyperlipidemia, unspecified: Secondary | ICD-10-CM | POA: Diagnosis present

## 2023-02-22 DIAGNOSIS — Z923 Personal history of irradiation: Secondary | ICD-10-CM

## 2023-02-22 LAB — CBC
HCT: 41.9 % (ref 39.0–52.0)
Hemoglobin: 13.9 g/dL (ref 13.0–17.0)
MCH: 32.6 pg (ref 26.0–34.0)
MCHC: 33.2 g/dL (ref 30.0–36.0)
MCV: 98.4 fL (ref 80.0–100.0)
Platelets: 163 10*3/uL (ref 150–400)
RBC: 4.26 MIL/uL (ref 4.22–5.81)
RDW: 13 % (ref 11.5–15.5)
WBC: 7.8 10*3/uL (ref 4.0–10.5)
nRBC: 0 % (ref 0.0–0.2)

## 2023-02-22 LAB — DIFFERENTIAL
Abs Immature Granulocytes: 0.02 10*3/uL (ref 0.00–0.07)
Basophils Absolute: 0.1 10*3/uL (ref 0.0–0.1)
Basophils Relative: 1 %
Eosinophils Absolute: 0.1 10*3/uL (ref 0.0–0.5)
Eosinophils Relative: 2 %
Immature Granulocytes: 0 %
Lymphocytes Relative: 39 %
Lymphs Abs: 3.1 10*3/uL (ref 0.7–4.0)
Monocytes Absolute: 0.5 10*3/uL (ref 0.1–1.0)
Monocytes Relative: 7 %
Neutro Abs: 4 10*3/uL (ref 1.7–7.7)
Neutrophils Relative %: 51 %

## 2023-02-22 LAB — COMPREHENSIVE METABOLIC PANEL
ALT: 12 U/L (ref 0–44)
AST: 24 U/L (ref 15–41)
Albumin: 4.2 g/dL (ref 3.5–5.0)
Alkaline Phosphatase: 47 U/L (ref 38–126)
Anion gap: 9 (ref 5–15)
BUN: 17 mg/dL (ref 8–23)
CO2: 23 mmol/L (ref 22–32)
Calcium: 9.1 mg/dL (ref 8.9–10.3)
Chloride: 105 mmol/L (ref 98–111)
Creatinine, Ser: 0.87 mg/dL (ref 0.61–1.24)
GFR, Estimated: >60 mL/min (ref >60)
Glucose, Bld: 103 mg/dL — ABNORMAL HIGH (ref 70–99)
Potassium: 4 mmol/L (ref 3.5–5.1)
Sodium: 137 mmol/L (ref 135–145)
Total Bilirubin: 0.6 mg/dL (ref 0.3–1.2)
Total Protein: 7.2 g/dL (ref 6.5–8.1)

## 2023-02-22 LAB — MRSA NEXT GEN BY PCR, NASAL: MRSA by PCR Next Gen: NOT DETECTED

## 2023-02-22 LAB — TROPONIN I (HIGH SENSITIVITY): Troponin I (High Sensitivity): 6 ng/L (ref <18)

## 2023-02-22 LAB — APTT: aPTT: 26 seconds (ref 24–36)

## 2023-02-22 LAB — CBG MONITORING, ED: Glucose-Capillary: 96 mg/dL (ref 70–99)

## 2023-02-22 LAB — HEMOGLOBIN A1C
Hgb A1c MFr Bld: 6.4 % — ABNORMAL HIGH (ref 4.8–5.6)
Mean Plasma Glucose: 136.98 mg/dL

## 2023-02-22 LAB — GLUCOSE, CAPILLARY: Glucose-Capillary: 124 mg/dL — ABNORMAL HIGH (ref 70–99)

## 2023-02-22 LAB — ETHANOL: Alcohol, Ethyl (B): <10 mg/dL (ref <10)

## 2023-02-22 LAB — PROTIME-INR
INR: 1.1 (ref 0.8–1.2)
Prothrombin Time: 13.8 seconds (ref 11.4–15.2)

## 2023-02-22 MED ORDER — INSULIN ASPART 100 UNIT/ML IJ SOLN: 0.0000 [IU] | Freq: Three times a day (TID) | INTRAMUSCULAR | Status: DC

## 2023-02-22 MED ORDER — SODIUM CHLORIDE 0.9% FLUSH
3.0000 mL | Freq: Once | INTRAVENOUS | Status: AC
  Administered 2023-02-22: 3 mL via INTRAVENOUS

## 2023-02-22 MED ORDER — TENECTEPLASE FOR STROKE
0.2500 mg/kg | PACK | Freq: Once | INTRAVENOUS | Status: AC
  Administered 2023-02-22: 24 mg via INTRAVENOUS

## 2023-02-22 MED ORDER — IOHEXOL 350 MG/ML SOLN
75.0000 mL | Freq: Once | INTRAVENOUS | Status: AC | PRN
  Administered 2023-02-22: 75 mL via INTRAVENOUS

## 2023-02-22 MED ORDER — STROKE: EARLY STAGES OF RECOVERY BOOK: Freq: Once | Status: AC

## 2023-02-22 MED ORDER — SENNOSIDES-DOCUSATE SODIUM 8.6-50 MG PO TABS: 1.0000 | ORAL_TABLET | Freq: Every evening | ORAL | Status: DC | PRN

## 2023-02-22 MED ORDER — INSULIN ASPART 100 UNIT/ML IJ SOLN: 0.0000 [IU] | Freq: Every day | INTRAMUSCULAR | Status: DC

## 2023-02-22 MED ORDER — SILDENAFIL CITRATE 20 MG PO TABS: 20.0000 mg | ORAL_TABLET | Freq: Every day | ORAL | 0 refills | Status: DC | PRN

## 2023-02-22 MED ORDER — ACETAMINOPHEN 325 MG PO TABS
650.0000 mg | ORAL_TABLET | ORAL | Status: DC | PRN
  Administered 2023-02-23 (×2): 650 mg via ORAL
  Filled 2023-02-22 (×2): qty 2

## 2023-02-22 MED ORDER — FAMOTIDINE 20 MG PO TABS: 20.0000 mg | ORAL_TABLET | Freq: Every day | ORAL | Status: DC | PRN

## 2023-02-22 MED ORDER — ACETAMINOPHEN 160 MG/5ML PO SOLN: 650.0000 mg | ORAL | Status: DC | PRN

## 2023-02-22 MED ORDER — ACETAMINOPHEN 650 MG RE SUPP: 650.0000 mg | RECTAL | Status: DC | PRN

## 2023-02-22 NOTE — ED Notes (Signed)
Pt. About to be transported to ICU, MRI to bedside, asking to take pt. To MRI first. Pt. To MRI, then will be transported to ICU. Sheria Lang, RN aware.

## 2023-02-22 NOTE — ED Notes (Signed)
Report given to primary RN and stroke coordinator.

## 2023-02-22 NOTE — Progress Notes (Signed)
PT Cancellation Note  Patient Details Name: Shannon Chung MRN: [id number] DOB: [birth year]   Cancelled Treatment:    Reason Eval/Treat Not Completed: Patient not medically ready PT orders received, chart reviewed. Pt noted to have received TNK on 02/22/23 at 1341. Per protocol, will hold PT evaluation for 24 hours s/p administration of TNK & await f/u imaging.  Aleda Grana, PT, DPT 02/22/23, 3:22 PM  Sandi Mariscal 02/22/2023, 3:21 PM

## 2023-02-22 NOTE — Progress Notes (Signed)
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 06/2015 Cardiac CT: Ca score 1103 (84th %'ile);  b. 07/2015 Cath: LM 70, LAD 80p, 100/38m, D1 70, D2 95, RI 75, RCA 100p/m;  c. 07/2015 CABG x 5 (LIMA->LAD, VG->Diag, VG->OM1->OM2, VG->OM3).   COVID-19    12/2021   COVID-19 01/18/2022   COVID-19 vaccine administered 01/18/2022   Unknown how many vaccine doses have been received. Entered from Emergency Triage Note.   Diabetes mellitus without complication 07/2015   Dyslipidemia    Essential hypertension    Essential hypertension 01/02/2015   Formatting of this note might be different from the original.  Last Assessment & Plan:   Chronic, stable. Continue current regimen.   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 dilated, EF 35% improved to 45-50% (2015);  b. 07/2015 EF 25-35% by LV gram.   Ischemic cardiomyopathy 01/02/2015   Kidney stones 04/17/2021   Lone atrial fibrillation 1983   a. isolated episode, not on OAC.   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 06/2014: LV; resolved with coumadin-->no residual on f/u echo, no longer on coumadin.   Mural thrombus of heart 08/26/2021   Formatting of this note might be different from the original. Jun 16, 2019 Entered By: [name] Comment: left ventricle, cardiac apex   Osteoarthritis    a. R-shoulder, L-knee [facility])   Personal history of colonic polyps    Polyp of colon    Prostate cancer 03/04/2022   PSA elevation 03/25/2018   Retention cyst of paranasal sinus 03/11/2022   Shoulder pain 12/23/2022   Jun 21, 2019 Entered By: [name] Comment: attributed to arthritis   Skin cancer    squamous and basal cell right forearm, SCC left cheek 10/04/20 sees derm regularly Dr. Roseanne Kaufman    Squamous cell carcinoma of vocal cord 2008   XRT; right vocal cord; had  f/u until 2013 or 2015 [facility]   Strain of muscle of right hip 08/28/2019   Stroke    Thrombocytopenia    Thrombocytopenia 02/14/2018   Torn medial meniscus 08/26/2021   Formatting of this note might be different from the original. Jun 16, 2019 Entered By: [name] Comment: leftAug 19, 2020 Entered By: [name] Comment: resolved by total left knee replacement Jun 16, 2019 Entered By: [name] Comment: leftAug 19, 2020 Entered By: [name] Comment: resolved by total left knee replacement   Trigger finger of left hand  07/07/2019   Vitamin D deficiency    Prior to Admission medications   Medication Sig Start Date End Date Taking? Authorizing Provider  acetaminophen (TYLENOL) 500 MG tablet Take 500 mg by mouth every 6 (six) hours as needed for mild pain.     [provider]  aspirin 81 MG EC tablet Take 162 mg by mouth daily. Swallow whole.    [provider]  atorvastatin (LIPITOR) 40 MG tablet TAKE 1 TABLET BY MOUTH DAILY 12/23/22   Antonieta Iba, MD  Cholecalciferol (VITAMIN D-3) 125 MCG (5000 UT) TABS Take by mouth daily.    [provider]  ezetimibe (ZETIA) 10 MG tablet Take 1 tablet (10 mg total) by mouth daily. 01/05/23   Antonieta Iba, MD  famotidine (PEPCID) 20 MG tablet TAKE 1 TABLET BY MOUTH 2 TIMES DAILY AS NEEDED FOR HEARTBURN/INDIGESTION. D/C ZANTAC 09/15/21   [name], Pasty Spillers, MD  fluticasone (FLONASE) 50 MCG/ACT nasal spray Place 1 spray into both nostrils as needed for allergies. Reported on 03/18/2016    [provider]  lansoprazole (PREVACID) 30 MG capsule Take 1 capsule (30 mg total) by mouth every morning. 08/12/15   Eustaquio Boyden, MD  lisinopril (ZESTRIL) 10 MG tablet TAKE 1 TABLET BY MOUTH  DAILY 04/25/22   Worthy Rancher B, FNP  metFORMIN (GLUCOPHAGE) 500 MG tablet Take 1 tablet (500 mg total) by mouth 2 (two) times daily with a meal. 08/26/22   Dana Allan, MD  metoprolol succinate (TOPROL-XL) 25 MG 24 hr tablet TAKE 1 TABLET BY MOUTH ONCE  DAILY 09/28/22   Antonieta Iba, MD  prednisoLONE acetate (PRED FORTE) 1 % ophthalmic suspension Place 1 drop into both eyes once daily    [provider]  sildenafil (REVATIO) 20 MG tablet Take 1 tablet (20 mg total) by mouth daily as needed. 02/22/23   Dana Allan, MD  vitamin B-12 (CYANOCOBALAMIN) 500 MCG tablet Take 500 mcg by mouth daily. Reported on 03/17/2016    [provider]  sildenafil (REVATIO) 20 MG tablet TAKE TWO TO FOUR TABLETS BY MOUTH ONCE DAILY AS NEEDED 12/31/21  02/22/23  [name], Pasty Spillers, MD    Clovia Cuff, PharmD, BCPS 02/22/2023 1:59 PM

## 2023-02-22 NOTE — Progress Notes (Signed)
Chap responded to Code Stroke at ED 14. Pt in Ct Scan, Pt Fiancee in the room. This Mirna Mires supplied a calming presence as well provided active listening as Steffanie Rainwater narrated life journey.Pt now in the room and getting set up for further observation. Mirna Mires is available as needed.    02/22/23 1333  Spiritual Encounters  Type of Visit Initial  Care provided to: Family  Conversation partners present during encounter Nurse  Referral source Code page  Reason for visit Code  OnCall Visit Yes  Spiritual Framework  Presenting Themes Impactful experiences and emotions  Interventions  Spiritual Care Interventions Made Compassionate presence;Narrative/life review  Intervention Outcomes  Outcomes Reduced isolation;Reduced anxiety;Awareness of support  Spiritual Care Plan  Spiritual Care Issues Still Outstanding No further spiritual care needs at this time (see row info)  Advance Directives (For Healthcare)  Does Patient Have a Medical Advance Directive? No  Mental Health Advance Directives  Does Patient Have a Mental Health Advance Directive? No

## 2023-02-22 NOTE — Telephone Encounter (Signed)
Rx sent to provider 

## 2023-02-22 NOTE — Code Documentation (Signed)
Stroke Response Nurse Documentation Code Documentation  Shannon Chung is a 82 y.o. male arriving to [facility] via [employer] [facility] on 02/22/2023 with past medical hx of ischemic cardiomyopathy, HTN, dyslipidemia, lone a-fib, CAD, previous stroke with no deficits, DM,GERD, MI. On No antithrombotic. Code stroke was activated by ED.   Patient from endo here in [facility] where he was LKW at 1225. 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 At 1225 he sat down on a bed and realized that he had right sided weakness and was brought to the ED for evaluation. Code stroke activated in the ED.  Stroke team meets patient in CT after Code Stroke notification. Labs drawn in CT after IV access obtained.  NIHSS 4>3, see documentation for details and code stroke times. Patient with right arm weakness, right leg weakness, and Expressive aphasia  on exam. The following imaging was completed:  CT Head and CTA. Patient is a candidate for IV Thrombolytic. TNK given in CT at 1341. Patient is not a candidate for IR due to no LVO on imaging, exam not consistent w/ LVO per MD.   Care Plan: post-thrombolytic care per order (q16min VS/NIHSS x2 hrs, q30 min VS/NIHSS x6 hrs, q1hr VS/NIHS x 16 hrs.) Swallow screen per order.  Bedside handoff with ED RN Shannon Chung.    Shannon Chung  Stroke Response RN

## 2023-02-22 NOTE — ED Triage Notes (Signed)
Patient to ED from volunteering at Endo. Patient states at 1225 her started having weakness/numbess in right arm and leg. Noticed he was not able to pick things up with that hand. Aox4.

## 2023-02-22 NOTE — H&P (Signed)
NAME:  Shannon Chung, MRN:  [id number], DOB:  September 05, 1941, LOS: 0 ADMISSION DATE:  02/22/2023, CONSULTATION DATE: 02/22/2023 REFERRING MD: Dr. Arnoldo Morale, CHIEF COMPLAINT: Right-sided weakness    History of Present Illness:  This is a 82 yo male with a 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  He presented to [facility] ER on 04/22 with acute onset of right-sided weakness.  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  His symptoms began at 12:20 pm today.    ED Course Upon arrival to the ER Code Stroke initiated and neurology consulted.  Labs were unremarkable. CT Head revealed no acute intracranial process and no evidence of acute hemorrhage, however ASPECTS 10.  CTA Head/Neck negative for emergent large vessel occlusion.  Pt deemed candidate for TNK per neurology.  PCCM team contacted for ICU admission post TNK.   Pertinent  Medical History  Anxiety Benign Neoplasm of Cecum/Transverse Colon  Bicep Tendinitis  CVA  CAD COVID-19 Type II Diabetes Mellitus  Essential HTN  Frequent PVC's GERD  Heart Attack  Ischemic Cardiomyopathy (Echo 11/11/15: EF 55 to 60%; grade 1 diastolic dysfunction) Lone Atrial Fibrillation in 1983 Mural Thrombus of Cardiac Apex  Prostate Cancer Thrombocytopenia  Vitamin D Deficiency   Significant Hospital Events: Including procedures, antibiotic start and stop dates in addition to other pertinent events   04/22: Pt admitted to ICU with stroke-like symptoms concerning for acute CVA s/p TNK   Interim History / Subjective:  Pt states his symptoms have improved significantly post TNK.  He still endorses slight RLE numbness   Objective   Blood pressure (!) 152/95, pulse 68, temperature 97.8 F (36.6 C), temperature source Oral, resp.  rate 20, weight 95.3 kg, SpO2 97 %.        Intake/Output Summary (Last 24 hours) at 02/22/2023 1534 Last data filed at 02/22/2023 1342 Gross per 24 hour  Intake 10 ml  Output --  Net 10 ml   Filed Weights   02/22/23 1339  Weight: 95.3 kg    Examination: General: Acutely-ill appearing male, NAD on RA  HENT: Supple, no JVD  Lungs: Clear throughout, even, non labored  Cardiovascular: NSR, s1s2, no m/r/g, 2+ radial/2+ distal pulses, no edema  Abdomen: +BS x4, soft, non tender, non distended  Extremities: Normal bulk and tone, moves all extremities   Neuro: Alert and oriented, following commands, PERRLA, motor strength 4/5 left hand; motor strength 5/5 right hand GU: Deferred   Resolved Hospital Problem list     Assessment & Plan:  Right-sided weakness concerning for possible acute CVA s/p TNK  Hx: HTN, Ischemic cardiomyopathy, and lone atrial fibrillation  - Continuous telemetry monitoring  - Neuro checks per NIH stroke scale - MRI Brain pending  - Stat CT Head for neurological changes  - Echo pending  - Allow for permissive hypertension goal bp less than 180/105 for the 1st 24 hrs post TNK  - Hold outpatient antihypertensives for now  - Hemoglobin A1c and lipid panel pending  - Bedside swallowing evaluation prior to po's  - No invasive lines/procedures 24 hrs post TNK  - No aspirin for 24 hours post TNK  - SCD's for VTE for px  - Trend CBC  - Monitor for s/sx of bleeding  - PT/OT   Type II  diabetes mellitus  - Hemoglobin A1c pending  - CBG's ac/hs  - SSI  - Target range 140 to 180 - Follow hypo/hyperglycemic protocol   Best Practice (right click and "Reselect all SmartList Selections" daily)   Diet/type: NPO DVT prophylaxis: SCD GI prophylaxis: H2B Lines: N/A Foley:  N/A Code Status:  full code Last date of multidisciplinary goals of care discussion [N/A]  Updated pt regarding current plan of care and all questions answered  Labs   CBC: Recent Labs  Lab  02/22/23 1340  WBC 7.8  NEUTROABS 4.0  HGB 13.9  HCT 41.9  MCV 98.4  PLT 163    Basic Metabolic Panel: Recent Labs  Lab 02/22/23 1340  NA 137  K 4.0  CL 105  CO2 23  GLUCOSE 103*  BUN 17  CREATININE 0.87  CALCIUM 9.1   GFR: Estimated Creatinine Clearance: 82.5 mL/min (by C-G formula based on SCr of 0.87 mg/dL). Recent Labs  Lab 02/22/23 1340  WBC 7.8    Liver Function Tests: Recent Labs  Lab 02/22/23 1340  AST 24  ALT 12  ALKPHOS 47  BILITOT 0.6  PROT 7.2  ALBUMIN 4.2   No results for input(s): "LIPASE", "AMYLASE" in the last 168 hours. No results for input(s): "AMMONIA" in the last 168 hours.  ABG    Component Value Date/Time   PHART 7.333 (L) 07/29/2015 1857   PCO2ART 45.2 (H) 07/29/2015 1857   PO2ART 87.0 07/29/2015 1857   HCO3 24.0 07/29/2015 1857   TCO2 25 07/29/2015 1857   ACIDBASEDEF 2.0 07/29/2015 1857   O2SAT 96.0 07/29/2015 1857     Coagulation Profile: Recent Labs  Lab 02/22/23 1414  INR 1.1    Cardiac Enzymes: No results for input(s): "CKTOTAL", "CKMB", "CKMBINDEX", "TROPONINI" in the last 168 hours.  HbA1C: Hgb A1c MFr Bld  Date/Time Value Ref Range Status  12/04/2021 11:24 AM 6.6 (H) 4.6 - 6.5 % Final    Comment:    Glycemic Control Guidelines for People with Diabetes:Non Diabetic:  <6%Goal of Therapy: <7%Additional Action Suggested:  >8%   04/01/2021 07:49 AM 6.6 (H) 4.6 - 6.5 % Final    Comment:    Glycemic Control Guidelines for People with Diabetes:Non Diabetic:  <6%Goal of Therapy: <7%Additional Action Suggested:  >8%     CBG: Recent Labs  Lab 02/22/23 1320  GLUCAP 96    Review of Systems: Positives in BOLD   Gen: Denies fever, chills, weight change, fatigue, night sweats HEENT: Denies blurred vision, double vision, hearing loss, tinnitus, sinus congestion, rhinorrhea, sore throat, neck stiffness, dysphagia PULM: Denies shortness of breath, cough, sputum production, hemoptysis, wheezing CV: Denies chest pain,  edema, orthopnea, paroxysmal nocturnal dyspnea, palpitations GI: Denies abdominal pain, nausea, vomiting, diarrhea, hematochezia, melena, constipation, change in bowel habits GU: Denies dysuria, hematuria, polyuria, oliguria, urethral discharge Endocrine: Denies hot or cold intolerance, polyuria, polyphagia or appetite change Derm: Denies rash, dry skin, scaling or peeling skin change Heme: Denies easy bruising, bleeding, bleeding gums Neuro: headache, right-sided numbness/weakness, slurred speech, loss of memory or consciousness  Past Medical History:  He,  has a past medical history of Adjustment reaction with anxiety and depression (10/07/2020), Anxiety (06/07/2020), Benign neoplasm of cecum, Benign neoplasm of transverse colon, Biceps tendinitis (10/10/2015), Central scotoma (12/23/2022), Cerebrovascular accident (CVA) (03/11/2022), Cone dystrophy (09/04/2013), Coronary artery disease, COVID-19, COVID-19 (01/18/2022), COVID-19 vaccine administered (01/18/2022), Diabetes mellitus without complication (07/2015), Dyslipidemia, Essential hypertension, Essential hypertension (01/02/2015), 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), Frequent PVCs (02/14/2018), Fuchs' corneal dystrophy (  2016), Fuchs' corneal dystrophy, GERD (gastroesophageal reflux disease), Grief (10/07/2020), Health maintenance examination (02/23/2017), Heart attack, Heart disease, 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), History of radiation exposure, History of radiation exposure, History of tonsillectomy (08/26/2021), Impingement syndrome of right shoulder (10/2015), Impingement syndrome of shoulder region (05/08/2015), Ischemic cardiomyopathy, Ischemic cardiomyopathy (01/02/2015), Kidney stones (04/17/2021), Lone atrial fibrillation (1983), Malignant neoplasm of prostate (10/07/2021), Medicare annual wellness visit, subsequent (10/21/2015), Mural thrombus of cardiac apex, Mural thrombus of heart (08/26/2021), Osteoarthritis, Personal  history of colonic polyps, Polyp of colon, Prostate cancer (03/04/2022), PSA elevation (03/25/2018), Retention cyst of paranasal sinus (03/11/2022), Shoulder pain (12/23/2022), Skin cancer, Squamous cell carcinoma of vocal cord (2008), Strain of muscle of right hip (08/28/2019), Stroke, Thrombocytopenia, Thrombocytopenia (02/14/2018), Torn medial meniscus (08/26/2021), Trigger finger of left hand (07/07/2019), and Vitamin D deficiency.   Surgical History:   Past Surgical History:  Procedure Laterality Date   BICEPS TENDON REPAIR Right 1993   CARDIAC CATHETERIZATION N/A 07/05/2015   Procedure: Left Heart Cath and Coronary Angiography;  Surgeon: Antonieta Iba, MD;  Location: [facility] INVASIVE CV LAB;  Service: Cardiovascular;  Laterality: N/A;   CATARACT EXTRACTION Left 12/2016   with keratoplasty   COLONOSCOPY  2007   COLONOSCOPY WITH PROPOFOL N/A 12/02/2017   TA, SSA, rpt 3 yrs(Tahiliani, Varnita B, MD)   COLONOSCOPY WITH PROPOFOL N/A 11/26/2020   Procedure: COLONOSCOPY WITH PROPOFOL;  Surgeon: Midge Minium, MD;  Location: [facility] ENDOSCOPY;  Service: Endoscopy;  Laterality: N/A;   CORONARY ARTERY BYPASS GRAFT N/A 07/29/2015   Procedure: CORONARY ARTERY BYPASS GRAFTING (CABG) x 5 (LIMA to LAD, SVG to DIAGONAL,  SVG SEQUENTIALLY to OM1 and OM2, SVG to OM3) with Endoscopic Vein Havesting of  GREATER SAPHENOUS VEIN from RIGHT THIGH and partial LOWER LEG ;  Surgeon: Alleen Borne, MD;  Location: [facility] OR;  Service: Open Heart Surgery;  Laterality: N/A;   EYE SURGERY     b/l cataract and cornea replaced    HAND SURGERY     left hand 1st/2nd trigger fingers Dr. [facility] ortho    JOINT REPLACEMENT     KNEE ARTHROSCOPY Left remote   MOHS SURGERY     left cheek scc 2022 Dr. Jeannine Boga   MOHS SURGERY     x 5 facial scc   right biceps tendon     repair/re attachment    SKIN CANCER EXCISION  10/2015   BCC - L ala (pending MOHs) and L scapula (complete excision)   TEE WITHOUT CARDIOVERSION N/A 07/29/2015    Procedure: TRANSESOPHAGEAL ECHOCARDIOGRAM (TEE);  Surgeon: Alleen Borne, MD;  Location: [facility] OR;  Service: Open Heart Surgery;  Laterality: N/A;   TONSILLECTOMY  1949   TOTAL KNEE ARTHROPLASTY Left 03/18/2016   cemented L TKR; Deeann Saint, MD     Social History:   reports that he quit smoking about 44 years ago. His smoking use included cigarettes. He has a 5.00 pack-year smoking history. He has never used smokeless tobacco. He reports current alcohol use. He reports that he does not use drugs.   Family History:  His family history includes Alcoholism in his father; CAD (age of onset: 24) in his father; Cancer in his daughter; Diabetes in his father; Hyperlipidemia in his father; Hypertension in his father.   Allergies Allergies  Allergen Reactions   Bee Pollen Itching   Pollen Extract Itching     Home Medications  Prior to Admission medications   Medication Sig Start Date End Date Taking? Authorizing Provider  acetaminophen (TYLENOL) 500 MG tablet Take 500 mg by mouth every 6 (six) hours as needed for mild pain.    Yes [provider]  aspirin 81 MG EC tablet Take 162 mg by mouth daily. Swallow whole.   Yes [provider]  atorvastatin (LIPITOR) 40 MG tablet TAKE 1 TABLET BY MOUTH DAILY 12/23/22  Yes Gollan, Tollie Pizza, MD  Cholecalciferol (VITAMIN D-3) 125 MCG (5000 UT) TABS Take 5,000 Units by mouth daily.   Yes [provider]  ezetimibe (ZETIA) 10 MG tablet Take 1 tablet (10 mg total) by mouth daily. 01/05/23  Yes Gollan, Tollie Pizza, MD  famotidine (PEPCID) 20 MG tablet TAKE 1 TABLET BY MOUTH 2 TIMES DAILY AS NEEDED FOR HEARTBURN/INDIGESTION. D/C ZANTAC 09/15/21  Yes [name], Pasty Spillers, MD  fluticasone (FLONASE) 50 MCG/ACT nasal spray Place 1 spray into both nostrils daily as needed for allergies.   Yes [provider]  lansoprazole (PREVACID) 30 MG capsule Take 1 capsule (30 mg total) by mouth every morning. 08/12/15  Yes Eustaquio Boyden, MD   lisinopril (ZESTRIL) 10 MG tablet TAKE 1 TABLET BY MOUTH  DAILY 04/25/22  Yes Worthy Rancher B, FNP  metFORMIN (GLUCOPHAGE) 500 MG tablet Take 1 tablet (500 mg total) by mouth 2 (two) times daily with a meal. Patient taking differently: Take 1,000 mg by mouth daily with supper. 08/26/22  Yes Kallen Mccrystal Allan, MD  metoprolol succinate (TOPROL-XL) 25 MG 24 hr tablet TAKE 1 TABLET BY MOUTH ONCE  DAILY 09/28/22  Yes Gollan, Tollie Pizza, MD  prednisoLONE acetate (PRED FORTE) 1 % ophthalmic suspension Place 1 drop into both eyes daily.   Yes [provider]  sildenafil (REVATIO) 20 MG tablet Take 1 tablet (20 mg total) by mouth daily as needed. 02/22/23  Yes Averie Meiner Allan, MD  vitamin B-12 (CYANOCOBALAMIN) 500 MCG tablet Take 500 mcg by mouth daily.   Yes [provider]     Critical care time: 45 minutes      Zada Girt, AGNP  Pulmonary/Critical Care Pager [phone] (please enter 7 digits) PCCM Consult Pager [phone] (please enter 7 digits)

## 2023-02-22 NOTE — Consult Note (Addendum)
NEUROLOGY CONSULTATION NOTE   Date of service: February 22, 2023 Patient Name: Shannon Chung MRN:  [id number] DOB:  01-19-1941 Reason for consult: R sided weakness Requesting physician: Dr. 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 He also reported weakness and numbness in his R leg. LKW 1225. NIHSS = 3 for RUE and RLE drift and mild aphasia. Head CT showed no acute findings on personal review prior to administration of TNK. Risks, benefits, and alternatives to TNK were discussed and patient gave informed consent to proceed. CTA was not performed as part of the stroke code 2/2 exam not c/w LVO.    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 06/2015 Cardiac CT: Ca score 1103 (84th %'ile);  b. 07/2015 Cath: LM 70, LAD 80p, 100/25m, D1 70, D2 95, RI 75, RCA 100p/m;  c. 07/2015 CABG x 5 (LIMA->LAD, VG->Diag, VG->OM1->OM2, VG->OM3).   COVID-19    12/2021   COVID-19 01/18/2022   COVID-19 vaccine administered 01/18/2022   Unknown how many vaccine doses have been received. Entered from Emergency Triage Note.   Diabetes mellitus without complication 07/2015    Dyslipidemia    Essential hypertension    Essential hypertension 01/02/2015   Formatting of this note might be different from the original.  Last Assessment & Plan:   Chronic, stable. Continue current regimen.   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 dilated, EF 35% improved to 45-50% (2015);  b. 07/2015 EF 25-35% by LV gram.   Ischemic cardiomyopathy 01/02/2015   Kidney stones 04/17/2021   Lone atrial fibrillation 1983   a. isolated episode, not on OAC.   Malignant neoplasm of prostate 10/07/2021  09/2021    Medicare annual wellness visit, subsequent 10/21/2015   Mural thrombus of cardiac apex    a. 06/2014: LV; resolved with coumadin-->no residual on f/u echo, no longer on coumadin.   Mural thrombus of heart 08/26/2021   Formatting of this note might be different from the original. Jun 16, 2019 Entered By: [name] Comment: left ventricle, cardiac apex   Osteoarthritis    a. 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 Roseanne Kaufman    Squamous cell carcinoma of vocal cord 2008   XRT; right vocal cord; had f/u until 2013 or 2015 [facility]   Strain of muscle of right hip 08/28/2019   Stroke    Thrombocytopenia    Thrombocytopenia 02/14/2018   Torn medial meniscus 08/26/2021   Formatting of this note might be different from the original. Jun 16, 2019 Entered By: [name] Comment: leftAug 19, 2020 Entered By: [name] Comment: resolved by total left knee replacement Jun 16, 2019 Entered By: [name] Comment: leftAug 19, 2020 Entered By: [name] Comment: resolved by total left knee replacement   Trigger finger of left hand 07/07/2019   Vitamin D deficiency    Past Surgical History:  Procedure Laterality Date   BICEPS TENDON REPAIR Right 1993   CARDIAC CATHETERIZATION N/A 07/05/2015   Procedure: Left Heart Cath and Coronary Angiography;  Surgeon: Antonieta Iba, MD;  Location: [facility] INVASIVE CV LAB;  Service: Cardiovascular;  Laterality: N/A;   CATARACT EXTRACTION Left 12/2016   with keratoplasty   COLONOSCOPY  2007   COLONOSCOPY WITH PROPOFOL N/A 12/02/2017   TA, SSA, rpt 3 yrs(Tahiliani, Varnita B, MD)   COLONOSCOPY WITH PROPOFOL N/A 11/26/2020   Procedure: COLONOSCOPY WITH PROPOFOL;  Surgeon: Midge Minium, MD;  Location: [facility] ENDOSCOPY;  Service: Endoscopy;  Laterality: N/A;   CORONARY ARTERY BYPASS GRAFT N/A 07/29/2015   Procedure: CORONARY ARTERY BYPASS GRAFTING (CABG) x 5 (LIMA to LAD, SVG to DIAGONAL,  SVG SEQUENTIALLY to OM1 and OM2, SVG to OM3) with Endoscopic Vein Havesting of  GREATER SAPHENOUS VEIN from RIGHT THIGH and partial LOWER LEG ;  Surgeon: Alleen Borne, MD;  Location: [facility] OR;  Service: Open Heart Surgery;  Laterality: N/A;   EYE SURGERY     b/l cataract and cornea replaced    HAND SURGERY     left hand  1st/2nd trigger fingers Dr. Hyacinth Meeker ortho    JOINT REPLACEMENT     KNEE ARTHROSCOPY Left remote   MOHS SURGERY     left cheek scc 2022 Dr. Jeannine Boga   MOHS SURGERY     x 5 facial scc   right biceps tendon     repair/re attachment    SKIN CANCER EXCISION  10/2015   BCC - L ala (pending MOHs) and L scapula (complete excision)   TEE WITHOUT CARDIOVERSION N/A 07/29/2015   Procedure: TRANSESOPHAGEAL ECHOCARDIOGRAM (TEE);  Surgeon: Alleen Borne, MD;  Location: [facility] OR;  Service: Open Heart Surgery;  Laterality: N/A;   TONSILLECTOMY  1949   TOTAL KNEE ARTHROPLASTY Left 03/18/2016   cemented L TKR; Deeann Saint,  MD   Family History  Problem Relation Age of Onset   CAD Father 79       MI   Hypertension Father    Hyperlipidemia Father    Alcoholism Father    Diabetes Father    Cancer Daughter        dx'ed 23 retroperitoneal liposarcoma    Social History   Socioeconomic History   Marital status: Married    Spouse name: [place]   Number of children: 2   Years of education: Not on file   Highest education level: Some college, no degree  Occupational History    Comment: retired  Tobacco Use   Smoking status: Former    Packs/day: 0.50    Years: 10.00    Additional pack years: 0.00    Total pack years: 5.00    Types: Cigarettes    Quit date: 11/02/1978    Years since quitting: 44.3   Smokeless tobacco: Never   Tobacco comments:    former smoker 1967-1980 1 pk/week no FH lung cancer   Vaping Use   Vaping Use: Never used  Substance and Sexual Activity   Alcohol use: Yes    Alcohol/week: 0.0 standard drinks of alcohol    Comment: beer/wine on weekends   Drug use: No   Sexual activity: Yes  Other Topics Concern   Not on file  Social History Narrative   Lives with wife for 71yrs Greta Doom)   Moved from [place] years ago in 2015 to this area    Divorced, one daughter deceased   Retired Electronics engineer    Occupation Retired Clinical research associate   Edu: 1 yr college   Activity: volunteers at [employer]    Diet: good water, fruits/vegetables daily      Tested at risk for OSA in preop for CABG   Social Determinants of Health   Financial Resource Strain: Low Risk  (10/31/2021)   Overall Financial Resource Strain (CARDIA)    Difficulty of Paying Living Expenses: Not hard at all  Food Insecurity: No Food Insecurity (10/31/2021)   Hunger Vital Sign    Worried About Running Out of Food in the Last Year: Never true    Ran Out of Food in the Last Year: Never true  Transportation Needs: No Transportation Needs (10/31/2021)   PRAPARE - Administrator, Civil Service (Medical): No    Lack of Transportation (Non-Medical): No  Physical Activity: Unknown (08/14/2019)   Exercise Vital Sign    Days of Exercise per Week: 0 days    Minutes of Exercise per Session: Not on file  Stress: No Stress Concern Present (10/31/2021)   [employer] of Occupational Health - Occupational Stress Questionnaire    Feeling of Stress : Not at all  Social Connections: Unknown (10/31/2021)   Social Connection and Isolation Panel [NHANES]    Frequency of Communication with Friends and Family: More than three times a week    Frequency of Social Gatherings with Friends and Family: Once a week    Attends Religious Services: Not on Marketing executive or Organizations: Not on file    Attends Banker Meetings: Not on file    Marital Status: Living with partner   Allergies  Allergen Reactions   Bee Pollen Itching   Pollen Extract Itching    Medications   (Not in a hospital admission)     Current Facility-Administered Medications:    sodium chloride flush (NS) 0.9 % injection 3  mL, 3 mL, Intravenous, Once, Concha Se, MD   tenecteplase [facility]) injection for Stroke 24 mg, 0.25 mg/kg (Order-Specific), Intravenous, Once, Jefferson Fuel, MD  Current Outpatient Medications:    acetaminophen (TYLENOL) 500 MG tablet, Take 500 mg by mouth every 6 (six) hours as needed for mild  pain. , Disp: , Rfl:    aspirin 81 MG EC tablet, Take 162 mg by mouth daily. Swallow whole., Disp: , Rfl:    atorvastatin (LIPITOR) 40 MG tablet, TAKE 1 TABLET BY MOUTH DAILY, Disp: 90 tablet, Rfl: 3   Cholecalciferol (VITAMIN D-3) 125 MCG (5000 UT) TABS, Take by mouth daily., Disp: , Rfl:    ezetimibe (ZETIA) 10 MG tablet, Take 1 tablet (10 mg total) by mouth daily., Disp: 90 tablet, Rfl: 3   famotidine (PEPCID) 20 MG tablet, TAKE 1 TABLET BY MOUTH 2 TIMES DAILY AS NEEDED FOR HEARTBURN/INDIGESTION. D/C ZANTAC, Disp: 180 tablet, Rfl: 3   fluticasone (FLONASE) 50 MCG/ACT nasal spray, Place 1 spray into both nostrils as needed for allergies. Reported on 03/18/2016, Disp: , Rfl:    lansoprazole (PREVACID) 30 MG capsule, Take 1 capsule (30 mg total) by mouth every morning., Disp: 90 capsule, Rfl: 3   lisinopril (ZESTRIL) 10 MG tablet, TAKE 1 TABLET BY MOUTH  DAILY, Disp: 90 tablet, Rfl: 3   metFORMIN (GLUCOPHAGE) 500 MG tablet, Take 1 tablet (500 mg total) by mouth 2 (two) times daily with a meal., Disp: 180 tablet, Rfl: 3   metoprolol succinate (TOPROL-XL) 25 MG 24 hr tablet, TAKE 1 TABLET BY MOUTH ONCE  DAILY, Disp: 90 tablet, Rfl: 0   prednisoLONE acetate (PRED FORTE) 1 % ophthalmic suspension, Place 1 drop into both eyes once daily, Disp: , Rfl:    sildenafil (REVATIO) 20 MG tablet, Take 1 tablet (20 mg total) by mouth daily as needed., Disp: 30 tablet, Rfl: 0   vitamin B-12 (CYANOCOBALAMIN) 500 MCG tablet, Take 500 mcg by mouth daily. Reported on 03/17/2016, Disp: , Rfl:   Vitals   Vitals:   02/22/23 1332  BP: (!) 152/83  Pulse: 62  Resp: 18  SpO2: 93%     There is no height or weight on file to calculate BMI.  Physical Exam   Physical Exam Gen: A&O x4, NAD HEENT: Atraumatic, normocephalic;mucous membranes moist; oropharynx clear, tongue without atrophy or fasciculations. Neck: Supple, trachea midline. Resp: CTAB, no w/r/r CV: RRR, no m/g/r; nml S1 and S2. 2+ symmetric peripheral  pulses. Abd: soft/NT/ND; nabs x 4 quad Extrem: Nml bulk; no cyanosis, clubbing, or edema.  Neuro: *MS: A&O x4. Follows multi-step commands.  *Speech: fluid, nondysarthric, mild anomia *CN:    I: Deferred   II,III: PERRLA, VFF by confrontation, optic discs unable to be visualized 2/2 pupillary constriction   III,IV,VI: EOMI w/o nystagmus, no ptosis   V: Sensation intact from V1 to V3 to LT   VII: Eyelid closure was full.  Smile symmetric.   VIII: Hearing intact to voice   IX,X: Voice normal, palate elevates symmetrically    XI: SCM/trap 5/5 bilat   XII: Tongue protrudes midline, no atrophy or fasciculations  *Motor:   Normal bulk.  No tremor, rigidity or bradykinesia. No pronator drift. Drift but not to bed RUE and RLE. LUE and LLE full strength *Sensory: Intact to light touch, pinprick, temperature vibration throughout. Symmetric. Propioception intact bilat.  No double-simultaneous extinction.  *Coordination:  Finger-to-nose, heel-to-shin, rapid alternating motions were intact. *Reflexes:  2+ and symmetric throughout without clonus; toes down-going  bilat *Gait: deferred  NIHSS = 3 for RUE and RLE drift and mild aphasia  Premorbid mRS = 0   Labs   CBC: No results for input(s): "WBC", "NEUTROABS", "HGB", "HCT", "MCV", "PLT" in the last 168 hours.  Basic Metabolic Panel:  Lab Results  Component Value Date   NA 138 12/04/2021   K 4.5 12/04/2021   CO2 30 12/04/2021   GLUCOSE 105 (H) 12/04/2021   BUN 19 12/04/2021   CREATININE 0.90 12/04/2021   CALCIUM 9.6 12/04/2021   GFRNONAA >60 11/29/2017   GFRAA >60 11/29/2017   Lipid Panel:  Lab Results  Component Value Date   LDLCALC 37 12/04/2021   HgbA1c:  Lab Results  Component Value Date   HGBA1C 6.6 (H) 12/04/2021   Urine Drug Screen: No results found for: "LABOPIA", "COCAINSCRNUR", "LABBENZ", "AMPHETMU", "THCU", "LABBARB"  Alcohol Level No results found for: "ETH"  CT Head without contrast: No acute process on  personal review  Impression   This is a 82 yo gentleman with 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  Recommendations   - Admit to ICU - Neurochecks and NIHSS documentation per post-TNK protocol - STAT head CT for any change in neurologic exam - Non-con head CT 24 hrs post-TNK r/o hemorrhagic conversion - MRI brain wo contrast - F/u CTA H&N - TTE - no aspirin for 24 hours post TNK and until ICH ruled out by repeat head CT - keep SBP less than 180/105 for the 1st 24 hours post TNK to reduce the risk of hemorrhagic transformation - SCDs for DVT prophylaxis; can start SQ Heparin if head CT 24 hours post TNK is negative for ICH - NPO until swallow study completed - PT/OT and speech therapy - stroke education - outpatient f/u with neurology after discharge  This patient is critically ill and at significant risk of neurological worsening, death and care requires constant monitoring of vital signs, hemodynamics,respiratory and cardiac monitoring, neurological assessment, discussion with family, other specialists and medical decision making of high complexity. I spent 45 minutes of neurocritical care time  in the care of  this patient. This was time spent independent of any time provided by nurse practitioner or PA.  Bing Neighbors, MD [facility] [phone]  If 7pm- 7am, please page neurology on call as listed in AMION.

## 2023-02-22 NOTE — ED Provider Notes (Signed)
[facility] Provider Note    Event Date/Time   First MD Initiated Contact with Patient 02/22/23 1350     (approximate)   History   Code Stroke   HPI  Shannon Chung is a 82 y.o. male past medical history significant for hypertension, hyperlipidemia, diabetes, who presents to the emergency department with strokelike symptoms.  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  No slurring of speech.  Did not have any recent falls or head trauma.  No chest pain or shortness of breath.  No history of similar symptoms.  No history of a CVA.  Not on anticoagulation.     Physical Exam   Triage Vital Signs: ED Triage Vitals  Enc Vitals Group     BP 02/22/23 1332 (!) 152/83     Pulse Rate 02/22/23 1332 62     Resp 02/22/23 1332 18     Temp --      Temp src --      SpO2 02/22/23 1332 93 %     Weight 02/22/23 1339 210 lb 1.6 oz (95.3 kg)     Height --      Head Circumference --      Peak Flow --      Pain Score 02/22/23 1332 0     Pain Loc --      Pain Edu? --      Excl. in GC? --     Most recent vital signs: Vitals:   02/22/23 1355 02/22/23 1401  BP: (!) 155/86 (!) 157/79  Pulse: 65 71  Resp: 13 17  SpO2: 97% 95%    Physical Exam Constitutional:      Appearance: He is well-developed.  HENT:     Head: Atraumatic.  Eyes:     Conjunctiva/sclera: Conjunctivae normal.  Cardiovascular:     Rate and Rhythm: Regular rhythm.  Pulmonary:     Effort: No respiratory distress.  Musculoskeletal:     Cervical back: Normal range of motion.  Skin:    General: Skin is warm.  Neurological:     Mental Status: He is alert. Mental status is at baseline.     GCS: GCS eye subscore is 4. GCS verbal subscore is 5. GCS motor subscore is 6.     Cranial Nerves: Cranial nerves 2-12 are intact.     Motor: Motor function is intact.     Coordination: Coordination is intact.     IMPRESSION / MDM /  ASSESSMENT AND PLAN / ED COURSE  I reviewed the triage vital signs and the nursing notes.  On arrival to the emergency department patient was made an activated code stroke given TNK consideration.  Patient with right-sided deficits with numbness and weakness.  Patient was immediately evaluated by neurology Dr. Selina Cooley.  Taken to CT scanner.  CT scan of the head Noncon without signs of intracranial hemorrhage.  Patient was given TNK given concern for acute CVA.  Recommended nonemergent CTA given not an LVO candidate.  EKG  I, Corena Herter, the attending physician, personally viewed and interpreted this ECG.   Rate: Normal  Rhythm: Normal sinus  Axis: Normal  Intervals: Normal  ST&T Change: Mild ST elevation to the inferior leads.  No reciprocal change.  T waves that are inverted laterally.  No findings of acute ischemia or dysrhythmia.  No tachycardic or bradycardic dysrhythmias while on cardiac telemetry.  RADIOLOGY I independently reviewed imaging, my interpretation of imaging: CT  head without signs of intracranial hemorrhage or infarction.  LABS (all labs ordered are listed, but only abnormal results are displayed) Labs interpreted as -    Labs Reviewed  COMPREHENSIVE METABOLIC PANEL - Abnormal; Notable for the following components:      Result Value   Glucose, Bld 103 (*)    All other components within normal limits  CBC  DIFFERENTIAL  PROTIME-INR  APTT  ETHANOL  CBG MONITORING, ED  I-STAT CREATININE, ED  CBG MONITORING, ED     MDM  CTA showed no emergent LVO.  Lab work with no significant electrolyte abnormalities.  Glucose within normal limits.  Consulted hospitalist for admission for CVA given TNK.  On my evaluation patient had regained strength to his right upper and lower extremity with normal sensation.  Has a nonfocal neurologic exam and is back to his baseline at this time.     PROCEDURES:  Critical Care performed: yes  .Critical Care  Performed by:  Corena Herter, MD Authorized by: Corena Herter, MD   Critical care provider statement:    Critical care time (minutes):  30   Critical care time was exclusive of:  Separately billable procedures and treating other patients   Critical care was necessary to treat or prevent imminent or life-threatening deterioration of the following conditions:  CNS failure or compromise   Critical care was time spent personally by me on the following activities:  Development of treatment plan with patient or surrogate, discussions with consultants, evaluation of patient's response to treatment, examination of patient, ordering and review of laboratory studies, ordering and review of radiographic studies, ordering and performing treatments and interventions, pulse oximetry, re-evaluation of patient's condition and review of old charts   Patient's presentation is most consistent with acute presentation with potential threat to life or bodily function.   MEDICATIONS ORDERED IN ED: Medications  sodium chloride flush (NS) 0.9 % injection 3 mL (3 mLs Intravenous Given 02/22/23 1342)  tenecteplase (TNKASE) injection for Stroke 24 mg (24 mg Intravenous Given 02/22/23 1341)  iohexol (OMNIPAQUE) 350 MG/ML injection 75 mL (75 mLs Intravenous Contrast Given 02/22/23 1342)    FINAL CLINICAL IMPRESSION(S) / ED DIAGNOSES   Final diagnoses:  Cerebrovascular accident (CVA), unspecified mechanism     Rx / DC Orders   ED Discharge Orders     None        Note:  This document was prepared using Dragon voice recognition software and may include unintentional dictation errors.   Corena Herter, MD 02/22/23 1429

## 2023-02-22 NOTE — ED Notes (Signed)
This RN to MRI to pick pt. Up. Pt. Placed back on cardiac monitor and transported up to ICU. Sheria Lang RN received in person update on pt.

## 2023-02-22 NOTE — Telephone Encounter (Signed)
Prescription Request  02/22/2023  LOV: 12/23/2022  What is the name of the medication or equipment? sildenafil (REVATIO) 20 MG tablet  Have you contacted your pharmacy to request a refill? Yes   Which pharmacy would you like this sent to?   [facility] [address] [place], [place], [place] [address]    Patient notified that their request is being sent to the clinical staff for review and that they should receive a response within 2 business days.   Please advise at [facility] [phone]

## 2023-02-22 NOTE — ED Notes (Signed)
Pt. Provided [place] sandwich, and drink ate/drank without difficulty. NAD.

## 2023-02-22 NOTE — ED Notes (Signed)
Patient taken to CT by this RN

## 2023-02-22 NOTE — ED Notes (Signed)
Attempted to call report to ICU, states will call back.

## 2023-02-22 NOTE — Plan of Care (Signed)
  Problem: Education: Goal: Knowledge of disease or condition will improve Outcome: Progressing Goal: Knowledge of secondary prevention will improve (MUST DOCUMENT ALL) Outcome: Progressing   Problem: Ischemic Stroke/TIA Tissue Perfusion: Goal: Complications of ischemic stroke/TIA will be minimized Outcome: Progressing   Problem: Coping: Goal: Will verbalize positive feelings about self Outcome: Progressing Goal: Will identify appropriate support needs Outcome: Progressing   

## 2023-02-22 NOTE — Progress Notes (Signed)
OT Cancellation Note  Patient Details Name: Shannon Chung MRN: [id number] DOB: Jun 16, 1941   Cancelled Treatment:    Reason Eval/Treat Not Completed: Medical issues which prohibited therapy. OT orders received, chart reviewed. TNK was given on 02/22/23 at 1341. Per therapy protocol, pt on bed rest for 24 hours after TNK administration. Will re-attempt evaluation after follow up CT/MRI has been completed and pt is cleared for activity.    Gerrie Nordmann 02/22/2023, 3:22 PM

## 2023-02-22 NOTE — Telephone Encounter (Signed)
sildenafil (REVATIO) 20 MG tablet Last refill 12/31/2021 # 30 with 11 refills LOV 12/23/2022 NOV 06/23/2023

## 2023-02-22 NOTE — ED Notes (Signed)
ED Provider at bedside. 

## 2023-02-23 ENCOUNTER — Other Ambulatory Visit: Payer: Self-pay | Admitting: Neurology

## 2023-02-23 ENCOUNTER — Inpatient Hospital Stay: Payer: Medicare Other

## 2023-02-23 ENCOUNTER — Inpatient Hospital Stay (HOSPITAL_COMMUNITY)
Admit: 2023-02-23 | Discharge: 2023-02-23 | Disposition: A | Discharge status: Home / Self Care | Payer: Medicare Other | Attending: Critical Care Medicine | Admitting: Critical Care Medicine

## 2023-02-23 ENCOUNTER — Inpatient Hospital Stay (HOSPITAL_COMMUNITY)
Admit: 2023-02-23 | Discharge: 2023-02-23 | Disposition: A | Discharge status: Home / Self Care | Payer: Medicare Other | Attending: Nurse Practitioner | Admitting: Nurse Practitioner

## 2023-02-23 DIAGNOSIS — I639 Cerebral infarction, unspecified: Secondary | ICD-10-CM

## 2023-02-23 DIAGNOSIS — I6389 Other cerebral infarction: Secondary | ICD-10-CM | POA: Diagnosis not present

## 2023-02-23 LAB — GLUCOSE, CAPILLARY
Glucose-Capillary: 123 mg/dL — ABNORMAL HIGH (ref 70–99)
Glucose-Capillary: 113 mg/dL — ABNORMAL HIGH (ref 70–99)
Glucose-Capillary: 101 mg/dL — ABNORMAL HIGH (ref 70–99)

## 2023-02-23 LAB — ECHOCARDIOGRAM COMPLETE
AR max vel: 2.9 cm2
AV Area VTI: 3.44 cm2
AV Area mean vel: 2.67 cm2
AV Mean grad: 2 mmHg
AV Peak grad: 3 mmHg
Ao pk vel: 0.87 m/s
Area-P 1/2: 3.11 cm2
Est EF: >55
Height: 72 in
MV VTI: 2.07 cm2
S' Lateral: 3.2 cm
Weight: 3361.6 oz

## 2023-02-23 LAB — URINE DRUG SCREEN, QUALITATIVE (ARMC ONLY)
Amphetamines, Ur Screen: NOT DETECTED
Barbiturates, Ur Screen: NOT DETECTED
Benzodiazepine, Ur Scrn: NOT DETECTED
Cannabinoid 50 Ng, Ur ~~LOC~~: NOT DETECTED
Cocaine Metabolite,Ur ~~LOC~~: NOT DETECTED
MDMA (Ecstasy)Ur Screen: NOT DETECTED
Methadone Scn, Ur: NOT DETECTED
Opiate, Ur Screen: NOT DETECTED
Phencyclidine (PCP) Ur S: NOT DETECTED

## 2023-02-23 LAB — LIPID PANEL
Cholesterol: 139 mg/dL (ref 0–200)
HDL: 46 mg/dL (ref >40)
LDL Cholesterol: 70 mg/dL (ref 0–99)
Total CHOL/HDL Ratio: 3 RATIO
Triglycerides: 117 mg/dL (ref <150)
VLDL: 23 mg/dL (ref 0–40)

## 2023-02-23 MED ORDER — ATORVASTATIN CALCIUM 80 MG PO TABS: 80.0000 mg | ORAL_TABLET | Freq: Every day | ORAL | 0 refills | Status: DC

## 2023-02-23 MED ORDER — CLOPIDOGREL BISULFATE 75 MG PO TABS: 75.0000 mg | ORAL_TABLET | Freq: Every day | ORAL | 0 refills | Status: AC

## 2023-02-23 MED ORDER — ATORVASTATIN CALCIUM 40 MG PO TABS: 40.0000 mg | ORAL_TABLET | Freq: Every day | ORAL | Status: DC

## 2023-02-23 MED ORDER — CLOPIDOGREL BISULFATE 75 MG PO TABS: 75.0000 mg | ORAL_TABLET | Freq: Every day | ORAL | Status: DC

## 2023-02-23 MED ORDER — PERFLUTREN LIPID MICROSPHERE
1.0000 mL | INTRAVENOUS | Status: AC | PRN
  Administered 2023-02-23: 2 mL via INTRAVENOUS

## 2023-02-23 MED ORDER — ATORVASTATIN CALCIUM 80 MG PO TABS
80.0000 mg | ORAL_TABLET | Freq: Every day | ORAL | Status: DC
  Filled 2023-02-23: qty 1

## 2023-02-23 MED ORDER — CHLORHEXIDINE GLUCONATE CLOTH 2 % EX PADS
6.0000 | MEDICATED_PAD | Freq: Every day | CUTANEOUS | Status: DC
  Administered 2023-02-23: 6 via TOPICAL

## 2023-02-23 MED ORDER — CLOPIDOGREL BISULFATE 75 MG PO TABS: 75.0000 mg | ORAL_TABLET | Freq: Every day | ORAL | 0 refills | Status: DC

## 2023-02-23 NOTE — Progress Notes (Signed)
SLP Cancellation Note  Patient Details Name: Shannon Chung MRN: [id number] DOB: 11-17-1940   Cancelled treatment:       Reason Eval/Treat Not Completed: SLP screened, no needs identified, will sign off (chart reviewed; consulted NSG then met w/ pt/Wife in room)  Pt denied any difficulty swallowing and is currently on a regular diet; tolerates swallowing pills w/ water per NSG.  Pt conversed in conversation w/out expressive/receptive deficits noted; pt denied any speech-language deficits. Speech clear, intelligible. Pt talking on the phone w/ his Daughter in appropriate speech/conversation.  No further skilled ST services indicated as pt appears at his baseline. Pt encouraged to f/u w/ his PCP post discharge if any concerns upon returning home to ADLs. Pt agreed. NSG to reconsult if any change in status while admitted.      Jerilynn Som, MS, CCC-SLP Speech Language Pathologist Rehab Services; [facility] [phone] (ascom) Haward Pope 02/23/2023, 11:54 AM

## 2023-02-23 NOTE — Evaluation (Signed)
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 He also reported weakness and numbness in his R leg. LKW 1225. NIHSS = 3 for RUE and RLE drift and mild aphasia. Head CT showed no acute findings on personal review prior to administration of TNK. Risks, benefits, and alternatives to TNK were discussed and patient gave informed consent to proceed.   Clinical Impression   Upon entering the room, pt supine in bed and agreeable to OT intervention. Pt reports living at home with wife and being Ind at baseline and driving. Pt reports feeling "back to normal". Pt performs bed mobility without assistance. He ambulates to sink with supervision for grooming tasks and then is able to ambulate 300' in unit with supervision and without use of AD. Pt is noted to have decreased dexterity and coordination of dominant R UE. However, he remains functional for tasks. OT discussed recommendations with pt and wife in room and they agree. OT exited the room as staff from neurology present to discuss findings with pt.      Recommendations for follow up therapy are one component of a multi-disciplinary discharge planning process, led by the attending physician.  Recommendations may be updated based on patient status, additional functional criteria and insurance authorization.   Assistance Recommended at Discharge Intermittent Supervision/Assistance  Patient can return home with the following Assistance with cooking/housework;Assist for transportation;Help with stairs or ramp for entrance    Functional Status Assessment   Patient has had a recent decline in their functional status and demonstrates the ability to make significant improvements in function in a reasonable and predictable amount of time.  Equipment Recommendations  None recommended by OT       Precautions / Restrictions Precautions Precautions: Fall      Mobility Bed Mobility Overal bed mobility: Modified Independent             General bed mobility comments: no physical assistance provided    Transfers Overall transfer level: Needs assistance Equipment used: None Transfers: Sit to/from Stand Sit to Stand: Supervision                  Balance Overall balance assessment: Needs assistance Sitting-balance support: Feet supported Sitting balance-Leahy Scale: Normal     Standing balance support: During functional activity, No upper extremity supported Standing balance-Leahy Scale: Good                             ADL either performed or assessed with clinical judgement   ADL Overall ADL's : Needs assistance/impaired                                       General ADL Comments: supervision overall during session for standing grooming tasks at sink , transfers, and LB dressing.     Vision Patient Visual Report: No change from baseline              Pertinent Vitals/Pain Pain Assessment Pain Assessment: No/denies pain  Hand Dominance Right   Extremity/Trunk Assessment Upper Extremity Assessment Upper Extremity Assessment: RUE deficits/detail RUE Deficits / Details: AROM is WFLs but decreased dexterity and speed with coordination tasks RUE Coordination: decreased fine motor   Lower Extremity Assessment Lower Extremity Assessment: Defer to PT evaluation       Communication Communication Communication: No difficulties   Cognition Arousal/Alertness: Awake/alert Behavior During Therapy: WFL for tasks assessed/performed Overall Cognitive Status: Within Functional Limits for  tasks assessed                                                  Home Living Family/patient expects to be discharged to:: Private residence Living Arrangements: Spouse/significant other Available Help at Discharge: Family;Available 24 hours/day Type of Home: House Home Access: Stairs to enter [employer] of Steps: 1   Home Layout: One level     Bathroom Shower/Tub: Tub/shower unit;Walk-in shower         Home Equipment: None   Additional Comments: Pt has OTC knee brace he wear when working for additional support      Prior Functioning/Environment Prior Level of Function : Independent/Modified Independent;Driving                        OT Problem List: Decreased coordination;Decreased activity tolerance;Decreased safety awareness;Impaired balance (sitting and/or standing)      OT Treatment/Interventions: Self-care/ADL training;Therapeutic exercise;Therapeutic activities;Balance training;Neuromuscular education    OT Goals(Current goals can be found in the care plan section) Acute Rehab OT Goals Patient Stated Goal: to go home OT Goal Formulation: With patient/family Time For Goal Achievement: 03/09/23 Potential to Achieve Goals: Fair ADL Goals Pt Will Perform Grooming: Independently Pt Will Perform Lower Body Dressing: with modified independence;sit to/from stand Pt Will Transfer to Toilet: with modified independence Pt Will Perform Toileting - Clothing Manipulation and hygiene: with modified independence  OT Frequency: Min 1X/week       AM-PAC OT "6 Clicks" Daily Activity     Outcome Measure Help from another person eating meals?: None Help from another person taking care of personal grooming?: None Help from another person toileting, which includes using toliet, bedpan, or urinal?: None Help from another person bathing (including washing, rinsing, drying)?: None Help from another person to put on and taking off regular upper  body clothing?: None Help from another person to put on and taking off regular lower body clothing?: None 6 Click Score: 24   End of Session Nurse Communication: Mobility status  Activity Tolerance: Patient tolerated treatment well Patient left: in bed;with call bell/phone within reach;with family/visitor present  OT Visit Diagnosis: Unsteadiness on feet (R26.81);Other (comment) (coordination deficits)                Time: 5409-8119 OT Time Calculation (min): 20 min Charges:  OT General Charges $OT Visit: 1 Visit OT Evaluation $OT Eval Low Complexity: 1 Low OT Treatments $Therapeutic Activity: 8-22 mins  Jackquline Denmark, MS, OTR/L , CBIS ascom [phone]  02/23/23, 3:32 PM

## 2023-02-23 NOTE — Progress Notes (Signed)
*  PRELIMINARY RESULTS* Echocardiogram 2D Echocardiogram has been performed.  Cristela Blue 02/23/2023, 1:40 PM

## 2023-02-23 NOTE — Evaluation (Signed)
Physical Therapy Evaluation Patient Details Name: Shannon Chung MRN: [id number] DOB: September 21, 1941 Today's Date: 02/23/2023  History of Present Illness  Pt is a 82 y.o. male presenting to hospital 02/22/23 with c/o R sided weakness.  Pt admitted with stroke-like symptoms concerning for acute CVA and s/p TNK.  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  Clinical Impression  Prior to hospital admission, pt was independent with functional mobility; working at hospital 2x/week; lives with his wife in 1 level home with 1 step to enter.  No c/o pain during session.  Currently pt is SBA with transfers and CGA to ambulate 240 feet (no AD use).  During ambulation pt demonstrating wider BOS with B LE's externally rotated and decreased stance time R LE (pt reports baseline but pt's wife did not seem as sure).  Pt was steady with mobility overall but did appear more cautious with ambulation at times.  Pt would currently benefit from skilled PT to address noted impairments and functional limitations (see below for any additional details).  Upon hospital discharge, pt would benefit from ongoing therapy (pt and pt's wife interested in OP PT--TOC and MD notified).    Recommendations for follow up therapy are one component of a multi-disciplinary discharge planning process, led by the attending physician.  Recommendations may be updated based on patient status, additional functional criteria and insurance authorization.        Assistance Recommended at Discharge Intermittent Supervision/Assistance  Patient can return home with the following  A little help with walking and/or transfers;A little help with bathing/dressing/bathroom;Assistance with cooking/housework;Assist for transportation;Help with stairs or ramp for entrance    Equipment Recommendations None recommended by PT  Recommendations for Other Services       Functional Status Assessment Patient  has had a recent decline in their functional status and demonstrates the ability to make significant improvements in function in a reasonable and predictable amount of time.     Precautions / Restrictions Precautions Precautions: Fall Restrictions Weight Bearing Restrictions: No      Mobility  Bed Mobility Overal bed mobility: Modified Independent             General bed mobility comments: no difficulties sit to semi-supine in bed    Transfers Overall transfer level: Needs assistance Equipment used: None Transfers: Sit to/from Stand Sit to Stand: Supervision           General transfer comment: fairly strong stand from toilet x1 trial and bed x2 trials    Ambulation/Gait Ambulation/Gait assistance: Min guard Gait Distance (Feet): 240 Feet Assistive device: None   Gait velocity: decreased     General Gait Details: wider BOS with B LE's externally rotated and decreased stance time R LE (pt reports baseline)  Stairs            Wheelchair Mobility    Modified Rankin (Stroke Patients Only)       Balance Overall balance assessment: Needs assistance Sitting-balance support: No upper extremity supported, Feet supported Sitting balance-Leahy Scale: Normal Sitting balance - Comments: steady sitting reaching outside BOS   Standing balance support: During functional activity, No upper extremity supported Standing balance-Leahy Scale: Good Standing balance comment: no loss of balance with ambulation but pt appearing more cautious with activity                             Pertinent Vitals/Pain Pain Assessment  Pain Assessment: No/denies pain Vitals (HR and O2 on room air) stable and WFL throughout treatment session.    Home Living Family/patient expects to be discharged to:: Private residence Living Arrangements: Spouse/significant other Available Help at Discharge: Family;Available 24 hours/day Type of Home: House Home Access: Stairs to  enter   [employer] of Steps: 1   Home Layout: One level Home Equipment: None Additional Comments: Pt has OTC knee brace he wears when working for additional support.    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       Hand Dominance   Dominant Hand: Right    Extremity/Trunk Assessment   Upper Extremity Assessment Upper Extremity Assessment: Defer to OT evaluation RUE Deficits / Details: Per OT eval "AROM is WFLs but decreased dexterity and speed with coordination tasks" RUE Coordination: decreased fine motor    Lower Extremity Assessment Lower Extremity Assessment:  (intact B LE strength, light touch, tone, heel to shin coordination, and proprioception)    Cervical / Trunk Assessment Cervical / Trunk Assessment: Normal  Communication   Communication: No difficulties  Cognition Arousal/Alertness: Awake/alert Behavior During Therapy: WFL for tasks assessed/performed Overall Cognitive Status: Within Functional Limits for tasks assessed                                          General Comments  Nursing cleared pt for participation in physical therapy.  Pt agreeable to PT session.  Pt's wife present during session.    Exercises     Assessment/Plan    PT Assessment Patient needs continued PT services  PT Problem List Decreased strength;Decreased balance;Decreased mobility;Decreased activity tolerance       PT Treatment Interventions DME instruction;Gait training;Stair training;Functional mobility training;Therapeutic activities;Therapeutic exercise;Balance training;Patient/family education    PT Goals (Current goals can be found in the Care Plan section)  Acute Rehab PT Goals Patient Stated Goal: to improve balance PT Goal Formulation: With patient/family Time For Goal Achievement: 03/09/23 Potential to Achieve Goals:  Good    Frequency Min 4X/week     Co-evaluation               AM-PAC PT "6 Clicks" Mobility  Outcome Measure Help needed turning from your back to your side while in a flat bed without using bedrails?: None Help needed moving from lying on your back to sitting on the side of a flat bed without using bedrails?: None Help needed moving to and from a bed to a chair (including a wheelchair)?: A Little Help needed standing up from a chair using your arms (e.g., wheelchair or bedside chair)?: A Little Help needed to walk in hospital room?: A Little Help needed climbing 3-5 steps with a railing? : A Little 6 Click Score: 20    End of Session Equipment Utilized During Treatment: Gait belt Activity Tolerance: Patient tolerated treatment well Patient left: in bed;with call bell/phone within reach;with family/visitor present Nurse Communication: Mobility status;Precautions PT Visit Diagnosis: Other abnormalities of gait and mobility (R26.89);Muscle weakness (generalized) (M62.81)    Time: 1525-1550 PT Time Calculation (min) (ACUTE ONLY): 25 min   Charges:   PT Evaluation $PT Eval Low Complexity: 1 Low         Marquetta Weiskopf, PT 02/23/23, 5:15 PM

## 2023-02-23 NOTE — Discharge Summary (Addendum)
Physician Discharge Summary   Patient: Shannon Chung MRN: [id number]  DOB: June 11, 1941   Admit:     Date of Admission: 02/22/2023 Admitted from: home   Discharge: Date of discharge: 02/23/23 Disposition: Home Condition at discharge: good  CODE STATUS: FULL CODE     Discharge Physician: Sunnie Nielsen, DO [facility]     PCP: Dana Allan, MD  Recommendations for Outpatient Follow-up:  Follow up with PCP Dana Allan, MD in 1-2 weeks Please obtain labs/tests: as needed Significant medication changes: ASA + plavix x21d then Plavix alone Please follow up on the following pending results: cardiac ambulatory monitoring  Neurology outpatient follow-up PCP AND OTHER OUTPATIENT PROVIDERS: SEE BELOW FOR SPECIFIC DISCHARGE INSTRUCTIONS PRINTED FOR PATIENT IN ADDITION TO GENERIC AVS PATIENT INFO    Discharge Instructions     Ambulatory referral to Neurology   Complete by: As directed          Discharge Diagnoses: Principal Problem:   Acute CVA (cerebrovascular accident)       Hospital Course: This is a 82 yo male with a 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  HPI: He presented to [facility] ER on 04/22 with acute onset of right-sided weakness. 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 His symptoms began at 12:20 pm 02/22/23. 04/22: to ED. Code Stroke initiated and neurology consulted. Labs were unremarkable. CT Head revealed no acute intracranial process and no evidence of acute hemorrhage, however ASPECTS 10. CTA Head/Neck negative for emergent large vessel occlusion. Pt deemed candidate for TNK per neurology on 02/22/23 at 1341 . ICU admission post TNK. Symptoms improved, persistent mild RUE weakness.  04/23: CT head 24h  post-TNK negative. Echo no concerns.   Consultants:  Neurology   Procedures: None       ASSESSMENT & PLAN:   Principal Problem:   Acute CVA (cerebrovascular accident)   Stroke  Recovered unexpectedly quickly in the hospital  Comfortable for discharge after one midnight / 24h post TNK DAPT x21 days then plavix alone Cardiac monitoring and follow-up  outpatient f/u with neurology after discharge              Discharge Instructions  Allergies as of 02/23/2023       Reactions   Bee Pollen Itching   Pollen Extract Itching        Medication List     TAKE these medications    acetaminophen 500 MG tablet Commonly known as: TYLENOL Take 500 mg by mouth every 6 (six) hours as needed for mild pain.   aspirin EC 81 MG tablet Take 162 mg by mouth daily. Swallow whole.   atorvastatin 40 MG tablet Commonly known as: LIPITOR Take 1 tablet (40 mg total) by mouth daily.   clopidogrel 75 MG tablet Commonly known as: Plavix Take 1 tablet (75 mg total) by mouth daily.   ezetimibe 10 MG tablet Commonly known as: ZETIA Take 1 tablet (10 mg total) by mouth daily.   famotidine 20 MG tablet Commonly known as: PEPCID TAKE 1 TABLET BY MOUTH 2 TIMES DAILY AS NEEDED FOR HEARTBURN/INDIGESTION. D/C ZANTAC   fluticasone 50 MCG/ACT nasal spray Commonly known as: FLONASE Place 1 spray into both nostrils daily as needed for allergies.   lansoprazole 30 MG capsule Commonly known  as: PREVACID Take 1 capsule (30 mg total) by mouth every morning.   lisinopril 10 MG tablet Commonly known as: ZESTRIL TAKE 1 TABLET BY MOUTH  DAILY   metFORMIN 500 MG tablet Commonly known as: GLUCOPHAGE Take 1 tablet (500 mg total) by mouth 2 (two) times daily with a meal. What changed:  how much to take when to take this   metoprolol succinate 25 MG 24 hr tablet Commonly known as: TOPROL-XL TAKE 1 TABLET BY MOUTH ONCE  DAILY   prednisoLONE acetate 1 % ophthalmic  suspension Commonly known as: PRED FORTE Place 1 drop into both eyes daily.   sildenafil 20 MG tablet Commonly known as: REVATIO Take 1 tablet (20 mg total) by mouth daily as needed.   vitamin B-12 500 MCG tablet Commonly known as: CYANOCOBALAMIN Take 500 mcg by mouth daily.   Vitamin D-3 125 MCG (5000 UT) Tabs Take 5,000 Units by mouth daily.          Allergies  Allergen Reactions   Bee Pollen Itching   Pollen Extract Itching     Subjective: pt feeling well, no weakness or numbness   Discharge Exam: BP (!) 141/91   Pulse 61   Temp 97.6 F (36.4 C) (Oral)   Resp (!) 21   Ht 6' (1.829 m)   Wt 95.3 kg   SpO2 95%   BMI 28.49 kg/m  General: Pt is alert, awake, not in acute distress Cardiovascular: RRR, S1/S2 +, no rubs, no gallops Respiratory: CTA bilaterally, no wheezing, no rhonchi Abdominal: Soft, NT, ND, bowel sounds + Extremities: strength 5/5 grip, elbow/shoulder flexion/extension, hip flexion, dorsi and plantar flexion. Sensation intact and symmetrical face and arms/legs      The results of significant diagnostics from this hospitalization (including imaging, microbiology, ancillary and laboratory) are listed below for reference.     Microbiology: Recent Results (from the past 240 hour(s))  MRSA Next Gen by PCR, Nasal     Status: None   Collection Time: 02/22/23  1:24 PM   Specimen: Nasal Mucosa; Nasal Swab  Result Value Ref Range Status   MRSA by PCR Next Gen NOT DETECTED NOT DETECTED Final    Comment: (NOTE) The GeneXpert MRSA Assay (FDA approved for NASAL specimens only), is one component of a comprehensive MRSA colonization surveillance program. It is not intended to diagnose MRSA infection nor to guide or monitor treatment for MRSA infections. Test performance is not FDA approved in patients less than 35 years old. Performed at [facility], [address] [place], [place] [address]      Labs: BNP (last 3 results) No results  for input(s): "BNP" in the last 8760 hours. Basic Metabolic Panel: Recent Labs  Lab 02/22/23 1340  NA 137  K 4.0  CL 105  CO2 23  GLUCOSE 103*  BUN 17  CREATININE 0.87  CALCIUM 9.1   Liver Function Tests: Recent Labs  Lab 02/22/23 1340  AST 24  ALT 12  ALKPHOS 47  BILITOT 0.6  PROT 7.2  ALBUMIN 4.2   No results for input(s): "LIPASE", "AMYLASE" in the last 168 hours. No results for input(s): "AMMONIA" in the last 168 hours. CBC: Recent Labs  Lab 02/22/23 1340  WBC 7.8  NEUTROABS 4.0  HGB 13.9  HCT 41.9  MCV 98.4  PLT 163   Cardiac Enzymes: No results for input(s): "CKTOTAL", "CKMB", "CKMBINDEX", "TROPONINI" in the last 168 hours. BNP: Invalid input(s): "POCBNP" CBG: Recent Labs  Lab 02/22/23 1320 02/22/23 1708  02/22/23 2007 02/23/23 0748 02/23/23 1141  GLUCAP 96 123* 124* 113* 101*   D-Dimer No results for input(s): "DDIMER" in the last 72 hours. Hgb A1c Recent Labs    02/22/23 1340  HGBA1C 6.4*   Lipid Profile Recent Labs    02/23/23 0414  CHOL 139  HDL 46  LDLCALC 70  TRIG 117  CHOLHDL 3.0   Thyroid function studies No results for input(s): "TSH", "T4TOTAL", "T3FREE", "THYROIDAB" in the last 72 hours.  Invalid input(s): "FREET3" Anemia work up No results for input(s): "VITAMINB12", "FOLATE", "FERRITIN", "TIBC", "IRON", "RETICCTPCT" in the last 72 hours. Urinalysis    Component Value Date/Time   COLORURINE YELLOW 12/04/2021 1124   APPEARANCEUR CLOUDY (A) 12/04/2021 1124   LABSPEC 1.019 12/04/2021 1124   PHURINE 5.5 12/04/2021 1124   GLUCOSEU NEGATIVE 12/04/2021 1124   GLUCOSEU NEGATIVE 02/28/2018 0833   HGBUR NEGATIVE 12/04/2021 1124   BILIRUBINUR NEGATIVE 02/28/2018 0833   KETONESUR NEGATIVE 12/04/2021 1124   PROTEINUR NEGATIVE 12/04/2021 1124   UROBILINOGEN 0.2 02/28/2018 0833   NITRITE NEGATIVE 12/04/2021 1124   LEUKOCYTESUR NEGATIVE 12/04/2021 1124   Sepsis Labs Recent Labs  Lab 02/22/23 1340  WBC 7.8    Microbiology Recent Results (from the past 240 hour(s))  MRSA Next Gen by PCR, Nasal     Status: None   Collection Time: 02/22/23  1:24 PM   Specimen: Nasal Mucosa; Nasal Swab  Result Value Ref Range Status   MRSA by PCR Next Gen NOT DETECTED NOT DETECTED Final    Comment: (NOTE) The GeneXpert MRSA Assay (FDA approved for NASAL specimens only), is one component of a comprehensive MRSA colonization surveillance program. It is not intended to diagnose MRSA infection nor to guide or monitor treatment for MRSA infections. Test performance is not FDA approved in patients less than 64 years old. Performed at [facility], [address] [place], [place] [address]    Imaging ECHOCARDIOGRAM COMPLETE  Result Date: 02/23/2023    ECHOCARDIOGRAM REPORT   Patient Name:   TALIS IWAN Date of Exam: 02/23/2023 Medical Rec #:  [id number]       Height:       72.0 in Accession #:    [id number]      Weight:       210.1 lb Date of Birth:  July 31, 1941       BSA:          2.176 m Patient Age:    81 years        BP:           141/91 mmHg Patient Gender: M               HR:           61 bpm. Exam Location:  [facility] Procedure: 2D Echo, Cardiac Doppler, Color Doppler and Intracardiac            Opacification Agent Indications:     Stroke I63.9  History:         Patient has prior history of Echocardiogram examinations, most                  recent 11/11/2015. Risk Factors:Hypertension and Diabetes. Heart                  attack.  Sonographer:     Cristela Blue Referring Phys:  [id number] Ezequiel Essex Diagnosing Phys: Yvonne Kendall MD  Sonographer Comments: Suboptimal apical window. IMPRESSIONS  1. Left ventricular ejection fraction,  by estimation, is >55%. The left ventricle has normal function. Left ventricular endocardial border not optimally defined to evaluate regional wall motion. There is mild left ventricular hypertrophy. Left ventricular diastolic parameters are consistent with Grade I diastolic dysfunction  (impaired relaxation).  2. Right ventricular systolic function was not well visualized. The right ventricular size is normal. Tricuspid regurgitation signal is inadequate for assessing PA pressure.  3. The mitral valve is grossly normal. No evidence of mitral valve regurgitation. No evidence of mitral stenosis.  4. The aortic valve is tricuspid. There is mild thickening of the aortic valve. Aortic valve regurgitation is not visualized. Aortic valve sclerosis is present, with no evidence of aortic valve stenosis. FINDINGS  Left Ventricle: Left ventricular ejection fraction, by estimation, is >55%. The left ventricle has normal function. Left ventricular endocardial border not optimally defined to evaluate regional wall motion. Definity contrast agent was given IV to delineate the left ventricular endocardial borders. The left ventricular internal cavity size was normal in size. There is mild left ventricular hypertrophy. Left ventricular diastolic parameters are consistent with Grade I diastolic dysfunction (impaired relaxation). Right Ventricle: The right ventricular size is normal. No increase in right ventricular wall thickness. Right ventricular systolic function was not well visualized. Tricuspid regurgitation signal is inadequate for assessing PA pressure. Left Atrium: Left atrial size was normal in size. Right Atrium: Right atrial size was normal in size. Pericardium: There is no evidence of pericardial effusion. Mitral Valve: The mitral valve is grossly normal. No evidence of mitral valve regurgitation. No evidence of mitral valve stenosis. MV peak gradient, 5.0 mmHg. The mean mitral valve gradient is 1.0 mmHg. Tricuspid Valve: The tricuspid valve is not well visualized. Tricuspid valve regurgitation is trivial. Aortic Valve: The aortic valve is tricuspid. There is mild thickening of the aortic valve. Aortic valve regurgitation is not visualized. Aortic valve sclerosis is present, with no evidence of aortic  valve stenosis. Aortic valve mean gradient measures 2.0  mmHg. Aortic valve peak gradient measures 3.0 mmHg. Aortic valve area, by VTI measures 3.44 cm. Pulmonic Valve: The pulmonic valve was not well visualized. Pulmonic valve regurgitation is not visualized. No evidence of pulmonic stenosis. Aorta: The aortic root is normal in size and structure. Pulmonary Artery: The pulmonary artery is not well seen. Venous: The inferior vena cava was not well visualized. IAS/Shunts: The interatrial septum was not well visualized.  LEFT VENTRICLE PLAX 2D LVIDd:         5.30 cm   Diastology LVIDs:         3.20 cm   LV e' medial:    4.79 cm/s LV PW:         1.00 cm   LV E/e' medial:  14.2 LV IVS:        1.30 cm   LV e' lateral:   5.44 cm/s LVOT diam:     2.00 cm   LV E/e' lateral: 12.5 LV SV:         59 LV SV Index:   27 LVOT Area:     3.14 cm  LEFT ATRIUM             Index        RIGHT ATRIUM           Index LA diam:        2.70 cm 1.24 cm/m   RA Area:     13.00 cm LA Vol (A2C):   30.9 ml 14.20 ml/m  RA Volume:  24.50 ml  11.26 ml/m LA Vol (A4C):   37.0 ml 17.01 ml/m LA Biplane Vol: 36.0 ml 16.55 ml/m  AORTIC VALVE AV Area (Vmax):    2.90 cm AV Area (Vmean):   2.67 cm AV Area (VTI):     3.44 cm AV Vmax:           86.50 cm/s AV Vmean:          59.100 cm/s AV VTI:            0.171 m AV Peak Grad:      3.0 mmHg AV Mean Grad:      2.0 mmHg LVOT Vmax:         79.80 cm/s LVOT Vmean:        50.300 cm/s LVOT VTI:          0.187 m LVOT/AV VTI ratio: 1.09  AORTA Ao Root diam: 3.60 cm MITRAL VALVE MV Area (PHT): 3.11 cm     SHUNTS MV Area VTI:   2.07 cm     Systemic VTI:  0.19 m MV Peak grad:  5.0 mmHg     Systemic Diam: 2.00 cm MV Mean grad:  1.0 mmHg MV Vmax:       1.12 m/s MV Vmean:      54.4 cm/s MV Decel Time: 244 msec MV E velocity: 68.10 cm/s MV A velocity: 107.00 cm/s MV E/A ratio:  0.64 Christopher End MD Electronically signed by Yvonne Kendall MD Signature Date/Time: 02/23/2023/3:42:14 PM    Final    CT HEAD WO  CONTRAST ( )  Result Date: 02/23/2023 CLINICAL DATA:  Neuro deficit, acute, stroke suspected 24 hr scan post TNK r/o hemorrhagic conversion EXAM: CT HEAD WITHOUT CONTRAST TECHNIQUE: Contiguous axial images were obtained from the base of the skull through the vertex without intravenous contrast. RADIATION DOSE REDUCTION: This exam was performed according to the departmental dose-optimization program which includes automated exposure control, adjustment of the mA and/or kV according to patient size and/or use of iterative reconstruction technique. COMPARISON:  CT head February 22, 2023. FINDINGS: Brain: Remote left parietal infarct. No evidence of acute large vascular territory infarct, acute hemorrhage, mass lesion or midline shift. No hydrocephalus. Partially empty sella. Vascular: No hyperdense vessel. Skull: No acute fracture. Sinuses/Orbits: Clear sinuses.  No acute orbital findings. Other: No sizable mastoid effusions. IMPRESSION: No evidence of acute intracranial abnormality. Electronically Signed   By: Feliberto Harts M.D.   On: 02/23/2023 13:52      Time coordinating discharge: over 30 minutes  SIGNED:  Sunnie Nielsen DO [facility]

## 2023-02-23 NOTE — Progress Notes (Addendum)
Neurology Progress Note  Subjective: Patient is feeling well, R sided weakness and numbness resolved after TNK. Occupational therapy noticed slight incoordination of his dominant R hand, which he had noticed somewhat in the preceeding months.  Exam: Vitals:   02/23/23 1000 02/23/23 1200  BP: 108/70 (!) 141/91  Pulse: (!) 58 61  Resp: 11 (!) 21  Temp:  97.6 F (36.4 C)  SpO2: 92% 95%    Physical Exam Gen: A&Ox4, NAD HEENT: Atraumatic, normocephalic; oropharynx clear, tongue without atrophy or fasciculations. Resp: CTAB, normal work of breathing CV: RRR, extremities appear well-perfused. Abd: soft/NT/ND Extrem: Nml bulk; no cyanosis, clubbing, or edema.  Neuro: *MS: A&O x4. Follows multi-step commands.  *Speech: no dysarthria or aphasia, able to name and repeat. *CN:    I: Deferred   II,III: PERRLA, VFF by confrontation, optic discs not visualized 2/2 pupillary constriction   III,IV,VI: EOMI w/o nystagmus, no ptosis   V: Sensation intact from V1 to V3 to LT   VII: Eyelid closure was full.  Smile symmetric.   VIII: Hearing intact to voice   IX,X: Voice normal, palate elevates symmetrically    XI: SCM/trap 5/5 bilat   XII: Tongue protrudes midline, no atrophy or fasciculations  *Motor:   Normal bulk.  No tremor, rigidity or bradykinesia. No pronator drift.   Strength: Dlt Bic Tri WE WrF FgS Gr HF KnF KnE PlF DoF    Left 5 5 5 5 5 5 5 5 5 5 5 5     Right 5 5 5 5 5 5 5 5 5 5 5 5    *Sensory: Intact to light touch, pinprick, temperature vibration throughout. Symmetric. Propioception intact bilat.  No double-simultaneous extinction.  *Coordination:  Mild incoordination RAM on R hand, past-pointing on R FNF, intact L FNF *Reflexes:  2+ and symmetric throughout without clonus; toes down-going bilat *Gait: deferred  NIHSS = 0  Data:  MRI brain wo 1. No acute intracranial process. 2. Old infarcts in the left ACA-MCA watershed territory.  CT head wo 24 hrs post TNK No acute  process, no ICH  CTA H&N 1. No emergent large vessel occlusion. 2. 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 3. 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 4. Moderate stenosis of the left paraclinoid ICA, left intradural vertebral artery, and left P2 PCA.  CNS imaging personally reviewed; I agree with above interpretations.  TTE 1. Left ventricular ejection fraction, by estimation, is > 55% . The left ventricle has normal function. Left ventricular endocardial border not optimally defined to evaluate regional wall motion. There is mild left ventricular hypertrophy. Left ventricular diastolic parameters are consistent with Grade I diastolic dysfunction ( impaired relaxation) . 2. Right ventricular systolic function was not well visualized. The right ventricular size is normal. Tricuspid regurgitation signal is inadequate for assessing PA pressure. 3. The mitral valve is grossly normal. No evidence of mitral valve regurgitation. No evidence of mitral stenosis. 4. The aortic valve is tricuspid. There is mild thickening of the aortic valve. Aortic valve regurgitation is not visualized. Aortic valve sclerosis is present, with no evidence of aortic valve stenosis.  Stroke Labs     Component Value Date/Time   CHOL 139 02/23/2023 0414   CHOL 123 01/23/2016 0803   CHOL 155 11/28/2014 0000   CHOL 155 11/28/2014 0000   TRIG 117 02/23/2023 0414   TRIG 126 11/28/2014 0000   TRIG 126  11/28/2014 0000   HDL 46 02/23/2023 0414   HDL 49 01/23/2016 0803   HDL 48 11/28/2014 0000   CHOLHDL 3.0 02/23/2023 0414   VLDL 23 02/23/2023 0414   LDLCALC 70 02/23/2023 0414   LDLCALC 51 01/23/2016 0803   LDLCALC 82 11/28/2014 0000   LDLCALC 82 11/28/2014 0000   LABVLDL 23 01/23/2016 0803    Lab Results  Component Value Date/Time   HGBA1C 6.4 (H) 02/22/2023 01:40 PM   A/P: This is a 82 yo  gentleman with 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 MRI brain showed no acute infarct. Etiology of event favored to be acute ischemic stroke aborted by TNK vs TIA. I suspect the R hand incoordination is 2/2 prior ischemic infarct seen on MRI.  - ASA 81mg  daily + plavix 75mg  daily x21 days f/b plavix 75mg  daily - Atorvastatin 40mg  daily - Ambulatory cardiac monitoring - OK to discharge today. I will arrange outpatient neuro f/u  Bing Neighbors, MD [facility] [phone]  If 7pm- 7am, please page neurology on call as listed in AMION.  Addendum 03/23/23 for clarification: Patient had a single episode of a fib 40 yrs ago and has had no known episodes since therefore anticoagulation is not indicated in this patient.  Bing Neighbors, MD [facility] [phone]  If 7pm- 7am, please page neurology on call as listed in AMION.

## 2023-02-23 NOTE — Hospital Course (Addendum)
This is a 81 yo male with a r40emote hx of CVA with no residual deficits, HTN, single episode of atrial fibrillation in the 1980's without recurrence (not on anticoagulation but takes aspirin 81 mg daily).  HPI: He presented to [facility] ER on 04/22 with acute onset of right-sided weakness. 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 His symptoms began at 12:20 pm 02/22/23. 04/22: to ED. Code Stroke initiated and neurology consulted. Labs were unremarkable. CT Head revealed no acute intracranial process and no evidence of acute hemorrhage, however ASPECTS 10. CTA Head/Neck negative for emergent large vessel occlusion. Pt deemed candidate for TNK per neurology on 02/22/23 at 1341 . ICU admission post TNK. Symptoms improved, persistent mild RUE weakness.  04/23: CT head 24h post-TNK negative. Echo no concerns.   Consultants:  Neurology   Procedures: None       ASSESSMENT & PLAN:   Principal Problem:   Acute CVA (cerebrovascular accident)   Stroke  DAPT x21 days then plavix alone Cardiac monitoring and follow-up  outpatient f/u with neurology after discharge

## 2023-02-23 NOTE — Progress Notes (Signed)
Home telemetry monitor applied for patient to go home with at discharge

## 2023-02-24 ENCOUNTER — Telehealth: Payer: Self-pay

## 2023-02-24 LAB — GLUCOSE, CAPILLARY: Glucose-Capillary: 109 mg/dL — ABNORMAL HIGH (ref 70–99)

## 2023-02-24 NOTE — Transitions of Care (Post Inpatient/ED Visit) (Signed)
   02/24/2023  Name: Shannon Chung MRN: [id number] DOB: [birth year]  Today's TOC FU Call Status: Today's TOC FU Call Status:: Unsuccessul Call (1st Attempt) Unsuccessful Call (1st Attempt) Date: 02/24/23  Attempted to reach the patient regarding the most recent Inpatient/ED visit.  Follow Up Plan: Additional outreach attempts will be made to reach the patient to complete the Transitions of Care (Post Inpatient/ED visit) call.   Signature Karena Addison, LPN [facility] Nurse Health Advisor Direct Dial [phone]

## 2023-02-24 NOTE — Transitions of Care (Post Inpatient/ED Visit) (Signed)
   02/24/2023  Name: Axl Rodino MRN: [id number] DOB: 01/23/1941  Today's TOC FU Call Status: Today's TOC FU Call Status:: Successful TOC FU Call Competed Unsuccessful Call (1st Attempt) Date: 02/24/23 [facility] FU Call Complete Date: 02/24/23  Transition Care Management Follow-up Telephone Call Date of Discharge: 02/23/23 Discharge Facility: [facility]) Type of Discharge: Inpatient Admission Primary Inpatient Discharge Diagnosis:: cerebral infarction How have you been since you were released from the hospital?: Better Any questions or concerns?: No  Items Reviewed: Did you receive and understand the discharge instructions provided?: Yes Medications obtained and verified?: Yes (Medications Reviewed) Any new allergies since your discharge?: No Do you have support at home?: Yes People in Home: significant other  Home Care and Equipment/Supplies: Were Home Health Services Ordered?: NA Any new equipment or medical supplies ordered?: NA  Functional Questionnaire: Do you need assistance with bathing/showering or dressing?: No Do you need assistance with meal preparation?: No Do you need assistance with eating?: No Do you have difficulty maintaining continence: No Do you need assistance with getting out of bed/getting out of a chair/moving?: No Do you have difficulty managing or taking your medications?: No  Follow up appointments reviewed: PCP Follow-up appointment confirmed?: Yes Date of PCP follow-up appointment?: 03/10/23 Follow-up Provider: [facility] Follow-up appointment confirmed?: No Reason Specialist Follow-Up Not Confirmed: Patient has Specialist Provider Number and will Call for Appointment Do you need transportation to your follow-up appointment?: No Do you understand care options if your condition(s) worsen?: Yes-patient verbalized understanding    SIGNATURE Karena Addison, LPN [facility] Nurse Health Advisor Direct Dial  [phone]

## 2023-02-25 ENCOUNTER — Telehealth: Payer: Self-pay | Admitting: Cardiovascular Disease

## 2023-02-25 NOTE — Telephone Encounter (Signed)
Patient stated he was released from [facility] on 4/23 and has a f/u with his PCP and wants to know if he will need to have a f/u with cardiology.

## 2023-02-25 NOTE — Telephone Encounter (Signed)
Returned call to patient.  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  Patient reports he is doing well and "fully functioning" expecting to return to work on 04/29.  Patient is wearing heart monitor for 2 weeks, ordered in hospital and applied before discharge.  Patient would like to make Dr. Mariah Milling aware that he had a stroke and for him to review his hospital notes. Patient is also asking about if he needs to follow-up with Dr. Mariah Milling.  Informed patient Dr. Mariah Milling may want to wait until after his heart monitor results come in. Will forward to Dr. Mariah Milling to review and advise.

## 2023-02-26 ENCOUNTER — Encounter: Payer: Self-pay | Admitting: Family Medicine

## 2023-03-04 ENCOUNTER — Encounter: Payer: Self-pay | Admitting: Family Medicine

## 2023-03-04 ENCOUNTER — Ambulatory Visit: Payer: Medicare Other | Admitting: Family Medicine

## 2023-03-04 VITALS — BP 118/70 | HR 67 | Temp 98.2°F | Ht 72.0 in | Wt 205.6 lb

## 2023-03-04 DIAGNOSIS — I152 Hypertension secondary to endocrine disorders: Secondary | ICD-10-CM | POA: Diagnosis not present

## 2023-03-04 DIAGNOSIS — Z7984 Long term (current) use of oral hypoglycemic drugs: Secondary | ICD-10-CM | POA: Diagnosis not present

## 2023-03-04 DIAGNOSIS — E1159 Type 2 diabetes mellitus with other circulatory complications: Secondary | ICD-10-CM

## 2023-03-04 DIAGNOSIS — Z8673 Personal history of transient ischemic attack (TIA), and cerebral infarction without residual deficits: Secondary | ICD-10-CM | POA: Insufficient documentation

## 2023-03-04 LAB — CBC
HCT: 43 % (ref 39.0–52.0)
Hemoglobin: 14.4 g/dL (ref 13.0–17.0)
MCHC: 33.5 g/dL (ref 30.0–36.0)
MCV: 97.9 fl (ref 78.0–100.0)
Platelets: 185 10*3/uL (ref 150.0–400.0)
RBC: 4.4 Mil/uL (ref 4.22–5.81)
RDW: 13.4 % (ref 11.5–15.5)
WBC: 6.8 10*3/uL (ref 4.0–10.5)

## 2023-03-04 LAB — BASIC METABOLIC PANEL
BUN: 20 mg/dL (ref 6–23)
CO2: 28 mEq/L (ref 19–32)
Calcium: 9.8 mg/dL (ref 8.4–10.5)
Chloride: 102 mEq/L (ref 96–112)
Creatinine, Ser: 1.04 mg/dL (ref 0.40–1.50)
GFR: 68.27 mL/min (ref >60.00)
Glucose, Bld: 124 mg/dL — ABNORMAL HIGH (ref 70–99)
Potassium: 4.6 mEq/L (ref 3.5–5.1)
Sodium: 140 mEq/L (ref 135–145)

## 2023-03-04 NOTE — Progress Notes (Signed)
SUBJECTIVE:   Chief Complaint  Patient presents with   Hospitalization Follow-up   HPI Patient presents to clinic requesting letter to return to work.  He is a Agricultural consultant at [employer].  He was admitted to [facility] 04/22 for right-sided numbness and weakness.  Code stroke was called.  CT head negative for acute hemorrhage or infarct.  CTA head and neck showed  70% of left proximal ICA and 60% right proximal ICA stenosis, severe nondominant/small right V2 vertebral artery stenosis in mid neck.  Moderate stenosis of the left P2 PCA.  MR brain showed no acute intra or cranial process.  Old infarct seen on left ACA-MCA watershed territory.  He was given TNK and observed in ICU for 24 hours.  Repeat CT head did not show any evidence of acute intracranial abnormalities.  He was started on ASA and Plavix x 21 days.  He has not seen neurology yet for follow-up.  Denies any visual changes, headaches, chest pain, shortness of breath, numbness, tingling or weakness.  Reports feeling is back to baseline and ready to go back to work.  Blood pressure well-controlled and compliant with current medication.  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  OBJECTIVE:  BP 118/70   Pulse 67   Temp 98.2 F (36.8 C) (Oral)   Ht 6' (1.829 m)   Wt 205 lb 9.6 oz (93.3 kg)   SpO2 97%   BMI 27.88 kg/m    Physical Exam Vitals reviewed.  Constitutional:      General: He is not in acute distress.    Appearance: He is normal weight. He is not ill-appearing.  HENT:     Head: Normocephalic.  Eyes:     General: No visual field deficit.    Conjunctiva/sclera: Conjunctivae normal.  Cardiovascular:     Rate and Rhythm: Normal rate and regular rhythm.     Pulses: Normal pulses.  Pulmonary:     Effort: Pulmonary effort is normal.     Breath sounds: Normal breath sounds.  Abdominal:      General: Bowel sounds are normal.  Neurological:     General: No focal deficit present.     Mental Status: He is alert and oriented to person, place, and time. Mental status is at baseline.     GCS: GCS eye subscore is 4. GCS verbal subscore is 5. GCS motor subscore is 6.     Cranial Nerves: Cranial nerves 2-12 are intact. No cranial nerve deficit, dysarthria or facial asymmetry.     Sensory: Sensation is intact. No sensory deficit.     Motor: Motor function is intact. No weakness, tremor, atrophy, abnormal muscle tone, seizure activity or pronator drift.     Coordination: Coordination is intact.     Gait: Gait is intact. Gait normal.  Psychiatric:        Mood and Affect: Mood normal.        Behavior: Behavior normal.        Thought Content: Thought content normal.        Judgment: Judgment normal.     ASSESSMENT/PLAN:  History of CVA (cerebrovascular accident) without residual deficits Assessment & Plan: Recent hospitalization for at [facility] for CVA without residual deficits.  CT head, MR head negative for acute hemorrhage.  CTA neck showed 70% stenosis left proximal ICA, 60% stenosis right proximal ICA. No focal deficits on  exam today.  Back to baseline. Check CBC, anemia Continue DAPT until evaluated by neurology Intensity statin Maintain blood pressure control less than 130/80 Zio patch in place Referral sent for physical therapy Neurology scheduled for evaluation 05/07.  Will defer work status until evaluation. Patient has not had follow-up with neurology.  Is requesting note to return to work.  Discussed with patient that since he has not had his follow-up with neurology I cannot clear him to return to work.  Recommend he follow-up with his neurologist and discuss clearance.  Should he need a letter from PCP will be happy to provide given that he also has clearance from his neurologist.  Orders: -     CBC -     Basic metabolic panel -     Ambulatory referral to Physical  Therapy  Hypertension associated with diabetes [facility]) Assessment & Plan: Chronic. Well controlled Continue Lisinopril 10 mg daily Maintain blood pressure less than 130/80     PDMP reviewed  Return in about 4 months (around 06/23/2023) for PCP.  Dana Allan, MD

## 2023-03-04 NOTE — Patient Instructions (Addendum)
It was a pleasure meeting you today. Thank you for allowing me to take part in your health care.  Our goals for today as we discussed include:  I am glad you are doing okay.  Will get some blood work today.  I have sent a referral to physical therapy at [facility].  They will call you to schedule an appointment.  Follow-up with neurology as scheduled.  They will determine when you are able to return to work.  If you need letter from PCP as well would be happy to send once you have been evaluated by neurology   If you have any questions or concerns, please do not hesitate to call the office at [phone].  I look forward to our next visit and until then take care and stay safe.  Regards,   Dana Allan, MD   [facility]

## 2023-03-04 NOTE — Assessment & Plan Note (Addendum)
Recent hospitalization for at [facility] for CVA without residual deficits.  CT head, MR head negative for acute hemorrhage.  CTA neck showed 70% stenosis left proximal ICA, 60% stenosis right proximal ICA. No focal deficits on exam today.  Back to baseline. Check CBC, anemia Continue DAPT until evaluated by neurology Intensity statin Maintain blood pressure control less than 130/80 Zio patch in place Referral sent for physical therapy Neurology scheduled for evaluation 05/07.  Will defer work status until evaluation. Patient has not had follow-up with neurology.  Is requesting note to return to work.  Discussed with patient that since he has not had his follow-up with neurology I cannot clear him to return to work.  Recommend he follow-up with his neurologist and discuss clearance.  Should he need a letter from PCP will be happy to provide given that he also has clearance from his neurologist.

## 2023-03-08 ENCOUNTER — Other Ambulatory Visit: Payer: Self-pay | Admitting: Cardiovascular Disease

## 2023-03-10 ENCOUNTER — Ambulatory Visit: Payer: Medicare Other | Attending: Family Medicine | Admitting: Physical Therapy

## 2023-03-10 ENCOUNTER — Inpatient Hospital Stay: Payer: Medicare Other | Admitting: Family Medicine

## 2023-03-10 ENCOUNTER — Encounter: Payer: Self-pay | Admitting: Physical Therapy

## 2023-03-10 ENCOUNTER — Telehealth: Payer: Self-pay | Admitting: Family Medicine

## 2023-03-10 DIAGNOSIS — Z8673 Personal history of transient ischemic attack (TIA), and cerebral infarction without residual deficits: Secondary | ICD-10-CM | POA: Diagnosis not present

## 2023-03-10 DIAGNOSIS — R269 Unspecified abnormalities of gait and mobility: Secondary | ICD-10-CM | POA: Diagnosis present

## 2023-03-10 DIAGNOSIS — M6281 Muscle weakness (generalized): Secondary | ICD-10-CM | POA: Insufficient documentation

## 2023-03-10 NOTE — Therapy (Signed)
OUTPATIENT PHYSICAL THERAPY NEURO EVALUATION   Patient Name: Shannon Chung MRN: [id number] DOB:December 26, 1940, 82 y.o., male Today's Date: 03/10/2023   PCP: Dana Allan, MD  REFERRING PROVIDER:   Dana Allan, MD    END OF SESSION:  PT End of Session - 03/10/23 1148     Visit Number 1    Number of Visits 16    Progress Note Due on Visit 10    PT Start Time 1145    PT Stop Time 1227    PT Time Calculation (min) 42 min    Equipment Utilized During Treatment Gait belt    Activity Tolerance Patient tolerated treatment well    Behavior During Therapy WFL for tasks assessed/performed             Past Medical History:  Diagnosis Date   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 06/2015 Cardiac CT: Ca score 1103 (84th %'ile);  b. 07/2015 Cath: LM 70, LAD 80p, 100/35m, D1 70, D2 95, RI 75, RCA 100p/m;  c. 07/2015 CABG x 5 (LIMA->LAD, VG->Diag, VG->OM1->OM2, VG->OM3).   COVID-19    12/2021   COVID-19 01/18/2022   COVID-19 vaccine administered 01/18/2022   Unknown how many vaccine doses have been received. Entered from Emergency Triage Note.   Diabetes mellitus without complication (HCC) 07/2015   Dyslipidemia    Essential hypertension    Essential hypertension 01/02/2015   Formatting of this note might be different from the original.  Last Assessment & Plan:   Chronic, stable. Continue current regimen.   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 dilated, EF 35% improved to 45-50% (2015);  b. 07/2015 EF 25-35% by LV gram.   Ischemic cardiomyopathy 01/02/2015   Kidney stones 04/17/2021   Lone atrial fibrillation (HCC) 1983   a. isolated episode, not on OAC.   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 06/2014: LV; resolved with coumadin-->no residual on f/u echo, no longer on coumadin.   Mural thrombus of heart 08/26/2021   Formatting of this note might be different from the original. Jun 16, 2019 Entered By: [name] Comment: left ventricle, cardiac apex   Osteoarthritis    a.  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 Jun 16, 2019 Entered By: [name] Comment: leftAug 19, 2020 Entered By: [name] Comment: resolved by total left knee replacement Jun 16, 2019 Entered By: [name] Comment: leftAug 19, 2020 Entered By: [name] Comment: resolved by total left knee replacement   Trigger finger of left hand 07/07/2019   Vitamin D deficiency    Past Surgical History:  Procedure Laterality Date   BICEPS TENDON REPAIR Right 1993   CARDIAC CATHETERIZATION N/A 07/05/2015   Procedure: Left Heart Cath and Coronary Angiography;  Surgeon: Antonieta Iba, MD;  Location: [facility] INVASIVE CV LAB;  Service: Cardiovascular;  Laterality: N/A;   CATARACT EXTRACTION Left 12/2016   with keratoplasty   COLONOSCOPY  2007   COLONOSCOPY WITH PROPOFOL N/A 12/02/2017   TA, SSA, rpt 3 yrs(Tahiliani, Varnita B, MD)   COLONOSCOPY WITH PROPOFOL N/A 11/26/2020   Procedure: COLONOSCOPY WITH PROPOFOL;  Surgeon: Midge Minium, MD;  Location: [facility] ENDOSCOPY;  Service: Endoscopy;  Laterality: N/A;   CORONARY ARTERY BYPASS GRAFT N/A 07/29/2015   Procedure: CORONARY ARTERY BYPASS GRAFTING (CABG) x 5 (LIMA to LAD, SVG to DIAGONAL,  SVG SEQUENTIALLY to OM1 and OM2, SVG to OM3) with Endoscopic Vein Havesting of  GREATER SAPHENOUS VEIN from RIGHT THIGH and partial LOWER LEG ;  Surgeon: Alleen Borne, MD;  Location: [facility] OR;  Service: Open Heart Surgery;  Laterality: N/A;   EYE SURGERY     b/l cataract and cornea replaced    HAND SURGERY     left hand 1st/2nd trigger fingers Dr. Hyacinth Meeker ortho    JOINT REPLACEMENT     KNEE ARTHROSCOPY Left remote   MOHS SURGERY     left cheek scc 2022 Dr. Jeannine Boga   MOHS SURGERY     x 5 facial scc   right biceps tendon     repair/re attachment    SKIN CANCER EXCISION  10/2015   BCC - L ala (pending MOHs) and L scapula (complete excision)   TEE WITHOUT CARDIOVERSION N/A 07/29/2015   Procedure: TRANSESOPHAGEAL  ECHOCARDIOGRAM (TEE);  Surgeon: Alleen Borne, MD;  Location: [facility] OR;  Service: Open Heart Surgery;  Laterality: N/A;   TONSILLECTOMY  1949   TOTAL KNEE ARTHROPLASTY Left 03/18/2016   cemented L TKR; Deeann Saint, MD   Patient Active Problem List   Diagnosis Date Noted   History of CVA (cerebrovascular accident) without residual deficits 03/04/2023   Acute CVA (cerebrovascular accident) (HCC) 02/22/2023   Chronic radicular pain of lower back 08/10/2022   Cervical spondylosis 03/11/2022   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   Bilateral carotid artery stenosis 03/11/2022   Lumbar spondylosis 10/07/2021   DDD (degenerative disc disease),  lumbar 10/07/2021   History of radiation therapy 08/26/2021   Aortic atherosclerosis (HCC) 04/17/2021   Diverticulosis 04/17/2021   Hypertension associated with diabetes (HCC) 10/07/2020   Overweight (BMI 25.0-29.9) 10/07/2020   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   Vitamin D deficiency 08/01/2020   Polyp of sigmoid colon 08/01/2020   Insomnia 06/07/2020   Gastroesophageal reflux disease 04/04/2020   Degenerative joint disease of hand 03/01/2020   BPH (benign prostatic hyperplasia) 08/05/2018   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   Macular scar of both eyes 04/20/2018   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   Scotoma involving central area of both eyes 04/20/2018   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   Fatty liver 03/25/2018   Erectile dysfunction 02/23/2017   Hx of CABG 01/24/2017   Advanced care planning/counseling discussion 10/21/2015   Coronary artery disease of native artery of native heart with stable angina pectoris (HCC)    DM2 (diabetes mellitus, type 2) (HCC) 08/12/2015   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   Hyperlipidemia 01/02/2015   Osteoarthritis 01/02/2015   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/02/2014    ONSET DATE: 02/22/23  REFERRING DIAG: Z86.73 (ICD-10-CM) - History of CVA (cerebrovascular accident)   THERAPY  DIAG:  Abnormality of gait and mobility  Muscle weakness (generalized)  Rationale for Evaluation and Treatment: Rehabilitation  SUBJECTIVE:                                                                                                                                                                                             SUBJECTIVE STATEMENT: Pt reports having a stroke in April of 2024. 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 Pt has no pain, and has not had any LOB or falls since the stroke or in the last 6 months.  Pt accompanied by: self  PERTINENT HISTORY: From Neurology 03/10/23 visit: Patient with past medical history significant for CAD (CABG times 03/22/2015), carotid stenosis, hypertension, hyperlipidemia, T2DM, history of CVA. He presented to the ED 02/22/2023 with acute onset right-sided weakness, he received TNK. Patient has no residual symptoms, he presents to establish care with our office. Results of cardiac monitor are pending, he is well-established with cardiology. Patient was taking aspirin 81 mg at the time of presentation to the ED.  Reviewed and discussed results from recent hospitalization including provider notes, CTA head and neck, echocardiogram, CT, MRI brain. Results below. CTA head and neck did reveal left ICA stenosis of 70%, right ICA stenosis of 60%.   PAIN:  Are you having pain? No  PRECAUTIONS: None  WEIGHT BEARING RESTRICTIONS: No  FALLS: Has  patient fallen in last 6 months? No  LIVING ENVIRONMENT: Lives with: lives with their family and lives with their spouse Lives in: House/apartment Stairs: No Has following equipment at home: None  PLOF: Independent  PATIENT GOALS: To ensure he is physically at the level he was prior to the stroke in April.   OBJECTIVE:   DIAGNOSTIC FINDINGS: no acute findings from CVA  COGNITION: Overall cognitive status: Within functional limits for tasks  assessed   SENSATION: Not tested  COORDINATION: WNL  POSTURE: No Significant postural limitations  LOWER EXTREMITY ROM: WNL  LOWER EXTREMITY MMT:    MMT Right Eval Left Eval  Hip flexion 4 4+  Hip extension    Hip abduction 5 5  Hip adduction 5 5  Hip internal rotation 5 5  Hip external rotation 5 5  Knee flexion 4+ 4+  Knee extension 4+ 5  Ankle dorsiflexion 5 5  Ankle plantarflexion    Ankle inversion    Ankle eversion    (Blank rows = not tested)  BED MOBILITY:  WNL  TRANSFERS: Assistive device utilized: None  Sit to stand: Complete Independence Stand to sit: Complete Independence Chair to chair: Complete Independence Floor:  not tested     CURB:   Curb Comments: Reports hesitancy with curb   STAIRS: Level of Assistance: Modified independence Stair Negotiation Technique: Step to Pattern with Bilateral Rails Number of Stairs: 4  Height of Stairs: 6 in  Comments: step to pattern descending  GAIT: Gait pattern: decreased stride length Distance walked: 10 m Assistive device utilized: None Level of assistance: Complete Independence Comments:   FUNCTIONAL TESTS:  5 times sit to stand: 13.86 sec  6 minute walk test: test visit 2  10 meter walk test: 1 m/s Berg Balance Scale: 43 Dynamic Gait Index: 17  [facility] PT Assessment - 03/10/23 0001       Standardized Balance Assessment   Standardized Balance Assessment Berg Balance Test;Dynamic Gait Index (P)       Berg Balance Test   Sit to Stand Able to stand without using hands and stabilize independently    Standing Unsupported Able to stand safely 2 minutes    Sitting with Back Unsupported but Feet Supported on Floor or Stool Able to sit safely and securely 2 minutes    Stand to Sit Sits safely with minimal use of hands    Transfers Able to transfer safely, minor use of hands    Standing Unsupported with Eyes Closed Able to stand 10 seconds safely    Standing Unsupported with Feet Together Able to  place feet together independently and stand 1 minute safely    From Standing, Reach Forward with Outstretched Arm Can reach forward >5 cm safely (2")    From Standing Position, Pick up Object from Floor Able to pick up shoe, needs supervision    From Standing Position, Turn to Look Behind Over each Shoulder Looks behind from both sides and weight shifts well    Turn 360 Degrees Able to turn 360 degrees safely but slowly    Standing Unsupported, Alternately Place Feet on Step/Stool Able to complete >2 steps/needs minimal assist    Standing Unsupported, One Foot in Front Able to take small step independently and hold 30 seconds    Standing on One Leg Tries to lift leg/unable to hold 3 seconds but remains standing independently    Total Score 43      Dynamic Gait Index   Level Surface Normal (P)  Change in Gait Speed Normal (P)     Gait with Horizontal Head Turns Mild Impairment (P)     Gait with Vertical Head Turns Mild Impairment (P)     Gait and Pivot Turn Mild Impairment (P)     Step Over Obstacle Mild Impairment (P)     Step Around Obstacles Mild Impairment (P)     Steps Moderate Impairment (P)     Total Score 17 (P)              PATIENT SURVEYS:  FOTO 67 goal of 77   TODAY'S TREATMENT:                                                                                                                              DATE: EVAL Only     PATIENT EDUCATION: Education details: POC Person educated: Patient Education method: Explanation Education comprehension: verbalized understanding  HOME EXERCISE PROGRAM: Establish visit 2, focus on R LE foot clearance and balance   GOALS: Goals reviewed with patient? Yes  SHORT TERM GOALS: Target date: 04/07/2023     Patient will be independent in home exercise program to improve strength/mobility for better functional independence with ADLs. Baseline: No HEP currently  Goal status: INITIAL   LONG TERM GOALS: Target date:  05/05/2023      1.  Patient will increase FOTO score to equal to or greater than  77   to demonstrate statistically significant improvement in mobility and quality of life.  Baseline: 67 Goal status: INITIAL   2.  Patient will increase Berg Balance score by > 6 points to demonstrate decreased fall risk during functional activities. Baseline: 43 Goal status: INITIAL   3.   Patient will improve DGI by 4 points to reduce fall risk and demonstrate improved transfer/gait ability. Baseline: 17 Goal status: INITIAL  4.   Patient will increase six minute walk test distance to >1000 for progression to community ambulator and improve gait ability Baseline: visit 2 test  Goal status: INITIAL    ASSESSMENT:  CLINICAL IMPRESSION: Patient is a 82 y.o. M who was seen today for physical therapy evaluation and treatment for balance as well as evaluation following acute CVA that occured on February 22, 2023.  Patient presents with minor deficits in balance as evidenced by Sharlene Motts balance score as well as dynamic gait index.  Patient's most difficulty is with steady gait as well as with foot clearance and single-leg balance or balance with narrow base of support.  Based on patient's objective testing this date the patient is at moderate risk for falls will benefit from physical therapy interventions to improve his fall risk as well as to restore him to his prior level of function prior to his CVA. Pt will continue to benefit from skilled physical therapy intervention to address impairments, improve QOL, and attain therapy goals.     OBJECTIVE IMPAIRMENTS: Abnormal gait, decreased activity tolerance, decreased balance, and difficulty walking.  ACTIVITY LIMITATIONS: lifting and locomotion level  PARTICIPATION LIMITATIONS: community activity, occupation, and yard work  PERSONAL FACTORS: Age are also affecting patient's functional outcome.   REHAB POTENTIAL: Excellent  CLINICAL DECISION MAKING:  Stable/uncomplicated  EVALUATION COMPLEXITY: Low  PLAN:  PT FREQUENCY: 1-2x/week  PT DURATION: 8 weeks  PLANNED INTERVENTIONS: Therapeutic exercises, Therapeutic activity, Neuromuscular re-education, Balance training, Gait training, Patient/Family education, Self Care, Joint mobilization, Stair training, Dry Needling, Manual therapy, and Re-evaluation  PLAN FOR NEXT SESSION: , HEP for balance and foot clearance    Norman Herrlich, PT 03/10/2023, 1:47 PM

## 2023-03-10 NOTE — Telephone Encounter (Signed)
Dr Clent Ridges saw patient on 03/04/2023. 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 Also needs to state that patient is receiving appropriate medial therapy for his condition.  Please call patient when note is ready to be picked up.

## 2023-03-10 NOTE — Telephone Encounter (Signed)
Letter for return to work sent via MyChart  Shannon Allan, MD

## 2023-03-10 NOTE — Telephone Encounter (Signed)
Please see attached RTW letter from Neuro. Have asked pt for clarification on his letter request.

## 2023-03-10 NOTE — Telephone Encounter (Signed)
Per OV note 03/04/23: Follow-up with neurology as scheduled. They will determine when you are able to return to work.   Please advise on patient's request for RTW letter.  Thanks!

## 2023-03-10 NOTE — Telephone Encounter (Signed)
Spoke with patient and advised him we do not have a copy of the letter from Neuro. We will need this in order for Dr. Clent Ridges to see what they have determined as far as him returning to work, as there is no mention of this in his OV note with them. We also cannot see any letter they may have created for him. Attempted to explain this to the patient, also asked if he could upload a photo of the letter and send via his MyChart so he doesn't have to come back in to the office. He states he is very upset with this situation and does not want to come back to the office. I stated I understood his frustration however we do need a copy of the letter before Dr. Clent Ridges can write him one. He states he will try to upload a copy to MyChart.

## 2023-03-11 ENCOUNTER — Encounter: Payer: Self-pay | Admitting: Family Medicine

## 2023-03-17 ENCOUNTER — Ambulatory Visit: Payer: Medicare Other | Admitting: Physical Therapy

## 2023-03-17 DIAGNOSIS — M6281 Muscle weakness (generalized): Secondary | ICD-10-CM

## 2023-03-17 DIAGNOSIS — R269 Unspecified abnormalities of gait and mobility: Secondary | ICD-10-CM | POA: Diagnosis not present

## 2023-03-17 NOTE — Therapy (Addendum)
OUTPATIENT PHYSICAL THERAPY NEURO TREATMENT   Patient Name: Shannon Chung MRN: [id number] DOB:1941/03/12, 82 y.o., male Today's Date: 03/17/2023   PCP: Dana Allan, MD  REFERRING PROVIDER:   Dana Allan, MD    END OF SESSION:   PT End of Session - 03/10/23 1148     Visit Number 2   Number of Visits 16    Progress Note Due on Visit 10    PT Start Time 1330    PT Stop Time 1415    PT Time Calculation (min) 45 min    Equipment Utilized During Treatment Gait belt    Activity Tolerance Patient tolerated treatment well    Behavior During Therapy WFL for tasks assessed/performed           Past Medical History:  Diagnosis Date   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 06/2015 Cardiac CT: Ca score 1103 (84th %'ile);  b. 07/2015 Cath: LM 70, LAD 80p, 100/17m, D1 70, D2 95, RI 75, RCA 100p/m;  c. 07/2015 CABG x 5 (LIMA->LAD, VG->Diag, VG->OM1->OM2, VG->OM3).   COVID-19    12/2021   COVID-19 01/18/2022   COVID-19 vaccine administered 01/18/2022   Unknown how many vaccine doses have been received. Entered from Emergency Triage Note.   Diabetes mellitus without complication (HCC) 07/2015   Dyslipidemia    Essential hypertension    Essential hypertension 01/02/2015   Formatting of this note might be different from the original.  Last Assessment & Plan:   Chronic, stable. Continue current regimen.   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 dilated, EF 35% improved to 45-50% (2015);  b. 07/2015 EF 25-35% by LV gram.   Ischemic cardiomyopathy 01/02/2015   Kidney stones 04/17/2021   Lone atrial fibrillation (HCC) 1983   a. isolated episode, not on OAC.   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 06/2014: LV; resolved with coumadin-->no residual on f/u echo, no longer on coumadin.   Mural thrombus of heart 08/26/2021   Formatting of this note might be different from the original. Jun 16, 2019 Entered By: [name] Comment: left ventricle, cardiac apex   Osteoarthritis    a. 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 Jun 16, 2019 Entered By: [name] Comment: leftAug 19, 2020 Entered By: [name] Comment: resolved by total left knee replacement Jun 16, 2019 Entered By: [name] Comment: leftAug 19, 2020 Entered By: [name] Comment: resolved by total left knee replacement   Trigger finger of left hand 07/07/2019   Vitamin D deficiency    Past Surgical History:  Procedure Laterality Date   BICEPS TENDON REPAIR Right 1993   CARDIAC CATHETERIZATION N/A 07/05/2015   Procedure: Left Heart Cath and Coronary Angiography;  Surgeon: Antonieta Iba, MD;  Location: [facility] INVASIVE CV LAB;  Service: Cardiovascular;  Laterality: N/A;   CATARACT EXTRACTION Left 12/2016   with keratoplasty   COLONOSCOPY  2007   COLONOSCOPY WITH PROPOFOL N/A 12/02/2017   TA, SSA, rpt 3 yrs(Tahiliani, Varnita B, MD)   COLONOSCOPY WITH PROPOFOL N/A 11/26/2020   Procedure: COLONOSCOPY WITH PROPOFOL;  Surgeon: Midge Minium, MD;  Location: [facility] ENDOSCOPY;  Service: Endoscopy;  Laterality: N/A;   CORONARY ARTERY BYPASS GRAFT N/A 07/29/2015   Procedure: CORONARY ARTERY BYPASS GRAFTING (CABG) x 5 (LIMA to LAD, SVG to DIAGONAL,  SVG SEQUENTIALLY to OM1 and OM2, SVG to OM3) with Endoscopic Vein Hav[place] of  GREATER SAPHENOUS VEIN from RIGHT THIGH and partial LOWER LEG ;  Surgeon: Alleen Borne, MD;  Location: [facility] OR;  Service: Open Heart Surgery;  Laterality: N/A;   EYE SURGERY     b/l cataract and cornea replaced    HAND SURGERY     left hand 1st/2nd trigger fingers Dr. Hyacinth Meeker ortho    JOINT REPLACEMENT     KNEE ARTHROSCOPY Left remote   MOHS SURGERY     left cheek scc 2022 Dr. Jeannine Boga   MOHS SURGERY     x 5 facial scc   right biceps tendon     repair/re attachment    SKIN CANCER EXCISION  10/2015   BCC - L ala (pending MOHs) and L scapula (complete excision)   TEE WITHOUT CARDIOVERSION N/A 07/29/2015   Procedure: TRANSESOPHAGEAL  ECHOCARDIOGRAM (TEE);  Surgeon: Alleen Borne, MD;  Location: [facility] OR;  Service: Open Heart Surgery;  Laterality: N/A;   TONSILLECTOMY  1949   TOTAL KNEE ARTHROPLASTY Left 03/18/2016   cemented L TKR; Deeann Saint, MD   Patient Active Problem List   Diagnosis Date Noted   History of CVA (cerebrovascular accident) without residual deficits 03/04/2023   Acute CVA (cerebrovascular accident) (HCC) 02/22/2023   Chronic radicular pain of lower back 08/10/2022   Cervical spondylosis 03/11/2022   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   Bilateral carotid artery stenosis 03/11/2022   Lumbar spondylosis 10/07/2021   DDD (degenerative disc disease), lumbar 10/07/2021  History of radiation therapy 08/26/2021   Aortic atherosclerosis (HCC) 04/17/2021   Diverticulosis 04/17/2021   Hypertension associated with diabetes (HCC) 10/07/2020   Overweight (BMI 25.0-29.9) 10/07/2020   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   Vitamin D deficiency 08/01/2020   Polyp of sigmoid colon 08/01/2020   Insomnia 06/07/2020   Gastroesophageal reflux disease 04/04/2020   Degenerative joint disease of hand 03/01/2020   BPH (benign prostatic hyperplasia) 08/05/2018   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   Macular scar of both eyes 04/20/2018   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   Scotoma involving central area of both eyes 04/20/2018   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  Rationale for Evaluation and Treatment: Rehabilitation  SUBJECTIVE:                                                                                                                                                                                             SUBJECTIVE STATEMENT: Pt reports he has been doing well with no changes. He is excited regarding a graduation celebration for his grandson this weekend.  Pt accompanied by: self  PERTINENT HISTORY: From Neurology 03/10/23 visit: Patient with past medical history significant for CAD (CABG times 03/22/2015), carotid stenosis, hypertension, hyperlipidemia, T2DM, history of CVA. He presented to the ED 02/22/2023 with acute onset right-sided weakness, he received TNK. Patient has no residual symptoms, he presents to establish care with our office. Results of cardiac monitor are pending, he is well-established with cardiology. Patient was taking aspirin 81 mg at the time of presentation to the ED.  Reviewed and discussed results from recent hospitalization including provider notes, CTA head and neck, echocardiogram, CT, MRI brain. Results below. CTA head and neck did reveal left ICA stenosis of 70%, right ICA stenosis of 60%.   PAIN:  Are you having pain? No  PRECAUTIONS: None  WEIGHT BEARING RESTRICTIONS: No  FALLS: Has patient fallen in last 6 months? No  LIVING ENVIRONMENT: Lives with: lives with their family and lives with their spouse Lives in: House/apartment Stairs: No Has following equipment at home: None  PLOF: Independent  PATIENT GOALS: To ensure he is physically at the level he was prior to the stroke in April.   OBJECTIVE:   DIAGNOSTIC FINDINGS: no acute findings from CVA  COGNITION: Overall cognitive status: Within functional limits for tasks assessed   SENSATION: Not tested  COORDINATION: WNL  POSTURE: No Significant postural limitations  LOWER EXTREMITY ROM: WNL  LOWER EXTREMITY  MMT:    MMT Right Eval Left Eval  Hip flexion 4 4+  Hip extension    Hip abduction 5 5  Hip adduction 5 5  Hip internal rotation 5 5  Hip external rotation 5 5  Knee flexion 4+ 4+  Knee extension 4+ 5  Ankle dorsiflexion 5 5  Ankle plantarflexion    Ankle inversion    Ankle eversion    (Blank rows = not tested)  BED MOBILITY:  WNL  TRANSFERS: Assistive device utilized: None  Sit to stand: Complete Independence Stand to sit: Complete Independence Chair to chair: Complete Independence Floor:  not tested     CURB:   Curb Comments: Reports hesitancy with curb   STAIRS: Level of Assistance: Modified independence Stair Negotiation Technique: Step to Pattern with Bilateral Rails Number of Stairs: 4  Height of Stairs: 6 in  Comments: step to pattern descending  GAIT: Gait pattern: decreased stride length Distance walked: 10 m Assistive device utilized: None Level of assistance: Complete Independence Comments:   FUNCTIONAL TESTS:  5 times sit to stand: 13.86 sec  6 minute walk test: test visit 2  10 meter walk test: 1 m/s Berg Balance Scale: 43 Dynamic Gait Index: 17    PATIENT SURVEYS:  FOTO 67 goal of 77   TODAY'S TREATMENT:                                                                                                                              Exercise/Activity Sets/ Reps/Time/ Resistance Assistance Charge type Comments- Unless otherwise stated, CGA was provided and gait belt donned in order to ensure pt safety   NBOS on airex with head turns  3 x 45 sec  Neuro re-ed   SLS with UE support  2*30 sec   NMR   Marching- slow and controlled no UE assistance  10 x each UE Neuro re-ed   Tandem stance 3 x 45 sec  Neuro re-ed   Airex step up lateral 10 x ea  UE Neuro re-ed Unable to complete safely with UR assist ( difficulty with foot clearance and proper placement for completion of task)   Step taps  2 x 10  No UE  NMR Focus on foot clearance  850 ft    TE Decreased step length on the R LE noted   Standing step over and retro step back over foam roller  X 10 ea LE  No UE NMR Working on foot clearance and confidence with large reciprocal steps.  Treatment provided this session   Pt educated throughout session about proper posture and technique with exercises. Improved exercise technique, movement at target joints, use of target muscles after min to mod verbal, visual, tactile cues. Note: Portions of this document were prepared using Dragon voice recognition software and although reviewed may contain unintentional dictation errors in syntax, grammar, or spelling.     PATIENT EDUCATION: Education details: POC Person educated: Patient Education method: Explanation Education comprehension: verbalized understanding  HOME EXERCISE PROGRAM: Access Code: [email or web address] URL: [email or web address] Date: 03/17/2023 Prepared by: Thresa Ross  Exercises - Standing Single Leg Stance with Counter Support  - 1 x daily - 7 x weekly - 2 sets - 30 second  hold - Tandem Stance  - 1 x daily - 7 x weekly - 2 sets - 30 second  hold - Forward Step Touch  - 1 x daily - 7 x weekly - 2 sets - 10 reps - Standing March with Counter Support  - 1 x daily - 7 x weekly - 2 sets - 10 reps  GOALS: Goals reviewed with patient? Yes  SHORT TERM GOALS: Target date: 04/07/2023     Patient will be independent in home exercise program to improve strength/mobility for better functional independence with ADLs. Baseline: No HEP currently  Goal status: INITIAL   LONG TERM GOALS: Target date: 05/05/2023      1.  Patient will increase FOTO score to equal to or greater than  77   to demonstrate statistically significant improvement in mobility and quality of life.  Baseline: 67 Goal status: INITIAL   2.  Patient will increase Berg Balance score by > 6 points to demonstrate decreased fall risk during functional activities. Baseline:  43 Goal status: INITIAL   3.   Patient will improve DGI by 4 points to reduce fall risk and demonstrate improved transfer/gait ability. Baseline: 17 Goal status: INITIAL  4.   Patient will increase six minute walk test distance to >1000 for progression to community ambulator and improve gait ability Baseline: visit 2 test 03/17/23: 850 ft  Goal status: INITIAL    ASSESSMENT:  CLINICAL IMPRESSION: Pt presents to PT for second visit. Pt introduced to HEP for balance and foot clearance focus. Pt hs difficulty with R LE foot clearance with and also has impaired endurance with ambulation less thatn that of community Ambulator per results. Pt will continue to benefit from skilled physical therapy intervention to address impairments, improve QOL, and attain therapy goals.    OBJECTIVE IMPAIRMENTS: Abnormal gait, decreased activity tolerance, decreased balance, and difficulty walking.   ACTIVITY LIMITATIONS: lifting and locomotion level  PARTICIPATION LIMITATIONS: community activity, occupation, and yard work  PERSONAL FACTORS: Age are also affecting patient's functional outcome.   REHAB POTENTIAL: Excellent  CLINICAL DECISION MAKING: Stable/uncomplicated  EVALUATION COMPLEXITY: Low  PLAN:  PT FREQUENCY: 1-2x/week  PT DURATION: 8 weeks  PLANNED INTERVENTIONS: Therapeutic exercises, Therapeutic activity, Neuromuscular re-education, Balance training, Gait training, Patient/Family education, Self Care, Joint mobilization, Stair training, Dry Needling, Manual therapy, and Re-evaluation  PLAN FOR NEXT SESSION: , HEP for balance and foot clearance    Norman Herrlich, PT 03/17/2023, 1:08 PM

## 2023-03-18 NOTE — Addendum Note (Signed)
Encounter addended by: Oneida Arenas on: 03/18/2023 10:55 AM  Actions taken: Imaging Exam ended

## 2023-03-21 ENCOUNTER — Encounter: Payer: Self-pay | Admitting: Family Medicine

## 2023-03-21 NOTE — Assessment & Plan Note (Signed)
Chronic. Well controlled Continue Lisinopril 10 mg daily Maintain blood pressure less than 130/80

## 2023-03-24 ENCOUNTER — Ambulatory Visit: Payer: Medicare Other | Admitting: Physical Therapy

## 2023-03-24 ENCOUNTER — Encounter: Payer: Self-pay | Admitting: Physical Therapy

## 2023-03-24 DIAGNOSIS — R269 Unspecified abnormalities of gait and mobility: Secondary | ICD-10-CM

## 2023-03-24 DIAGNOSIS — M6281 Muscle weakness (generalized): Secondary | ICD-10-CM

## 2023-03-24 NOTE — Therapy (Signed)
OUTPATIENT PHYSICAL THERAPY NEURO TREATMENT   Patient Name: Shannon Chung MRN: [id number] DOB:[birth year], 82 y.o., male Today's Date: 03/24/2023   PCP: Dana Allan, MD  REFERRING PROVIDER:   Dana Allan, MD    END OF SESSION:  PT End of Session - 03/24/23 1438     Visit Number 3    Number of Visits 16    Date for PT Re-Evaluation 05/05/23    Progress Note Due on Visit 10    PT Start Time 1434    PT Stop Time 1514    PT Time Calculation (min) 40 min    Equipment Utilized During Treatment Gait belt    Activity Tolerance Patient tolerated treatment well    Behavior During Therapy [facility] for tasks assessed/performed               Past Medical History:  Diagnosis Date   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 06/2015 Cardiac CT: Ca score 1103 (84th %'ile);  b. 07/2015 Cath: LM 70, LAD 80p, 100/42m, D1 70, D2 95, RI 75, RCA 100p/m;  c. 07/2015 CABG x 5 (LIMA->LAD, VG->Diag, VG->OM1->OM2, VG->OM3).   COVID-19    12/2021   COVID-19 01/18/2022   COVID-19 vaccine administered 01/18/2022   Unknown how many vaccine doses have been received. Entered from Emergency Triage Note.   Diabetes mellitus without complication (HCC) 07/2015   Dyslipidemia    Essential hypertension    Essential hypertension 01/02/2015   Formatting of this note might be different from the original.  Last Assessment & Plan:   Chronic, stable. Continue current regimen.   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 dilated, EF 35% improved to 45-50% (2015);  b. 07/2015 EF 25-35% by LV gram.   Ischemic cardiomyopathy 01/02/2015   Kidney stones 04/17/2021   Lone atrial fibrillation (HCC) 1983   a. isolated episode, not on OAC.   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 06/2014: LV; resolved with coumadin-->no residual on f/u echo, no longer on coumadin.   Mural thrombus of heart 08/26/2021   Formatting of this note might be different from the original. Jun 16, 2019 Entered By: [name] Comment: left  ventricle, cardiac apex   Osteoarthritis    a. 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 Jun 16, 2019 Entered By: [name] Comment: leftAug 19, 2020 Entered By: [name] Comment: resolved by total left knee replacement Jun 16, 2019 Entered By: [name] Comment: leftAug 19, 2020 Entered By: [name] Comment: resolved by total left knee replacement   Trigger finger of left hand 07/07/2019   Vitamin D deficiency    Past Surgical History:  Procedure Laterality Date   BICEPS TENDON REPAIR Right 1993   CARDIAC CATHETERIZATION N/A 07/05/2015   Procedure: Left Heart Cath and Coronary Angiography;  Surgeon: Antonieta Iba, MD;  Location: [facility] INVASIVE CV LAB;  Service: Cardiovascular;  Laterality: N/A;   CATARACT EXTRACTION Left 12/2016   with keratoplasty   COLONOSCOPY  2007   COLONOSCOPY WITH PROPOFOL N/A 12/02/2017   TA, SSA, rpt 3 yrs(Tahiliani, Varnita B, MD)   COLONOSCOPY WITH PROPOFOL N/A 11/26/2020   Procedure: COLONOSCOPY WITH PROPOFOL;  Surgeon: Midge Minium, MD;  Location: [facility] ENDOSCOPY;  Service: Endoscopy;  Laterality: N/A;   CORONARY ARTERY BYPASS GRAFT N/A 07/29/2015   Procedure: CORONARY ARTERY BYPASS GRAFTING (CABG) x 5 (LIMA to LAD, SVG to DIAGONAL,  SVG SEQUENTIALLY to OM1 and OM2, SVG to OM3) with Endoscopic Vein Havesting of  GREATER SAPHENOUS VEIN from RIGHT THIGH and partial LOWER LEG ;  Surgeon: Alleen Borne, MD;  Location: [facility] OR;  Service: Open Heart Surgery;  Laterality: N/A;   EYE SURGERY     b/l cataract and cornea replaced    HAND SURGERY     left hand 1st/2nd trigger fingers Dr. Hyacinth Meeker ortho    JOINT REPLACEMENT     KNEE ARTHROSCOPY Left remote   MOHS SURGERY     left cheek scc 2022 Dr. Jeannine Boga   MOHS SURGERY     x 5 facial scc   right biceps tendon     repair/re attachment    SKIN CANCER EXCISION  10/2015   BCC - L ala (pending MOHs) and L scapula (complete excision)   TEE WITHOUT CARDIOVERSION N/A 07/29/2015    Procedure: TRANSESOPHAGEAL ECHOCARDIOGRAM (TEE);  Surgeon: Alleen Borne, MD;  Location: [facility] OR;  Service: Open Heart Surgery;  Laterality: N/A;   TONSILLECTOMY  1949   TOTAL KNEE ARTHROPLASTY Left 03/18/2016   cemented L TKR; Deeann Saint, MD   Patient Active Problem List   Diagnosis Date Noted   History of CVA (cerebrovascular accident) without residual deficits 03/04/2023   Acute CVA (cerebrovascular accident) (HCC) 02/22/2023   Chronic radicular pain of lower back 08/10/2022   Cervical spondylosis 03/11/2022   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   Bilateral carotid artery stenosis 03/11/2022  Lumbar spondylosis 10/07/2021   DDD (degenerative disc disease), lumbar 10/07/2021   History of radiation therapy 08/26/2021   Aortic atherosclerosis (HCC) 04/17/2021   Diverticulosis 04/17/2021   Hypertension associated with diabetes (HCC) 10/07/2020   Overweight (BMI 25.0-29.9) 10/07/2020   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   Vitamin D deficiency 08/01/2020   Polyp of sigmoid colon 08/01/2020   Insomnia 06/07/2020   Gastroesophageal reflux disease 04/04/2020   Degenerative joint disease of hand 03/01/2020   BPH (benign prostatic hyperplasia) 08/05/2018   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   Macular scar of both eyes 04/20/2018   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   Scotoma involving central area of both eyes 04/20/2018   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   Fatty liver 03/25/2018   Erectile dysfunction 02/23/2017   Hx of CABG 01/24/2017   Advanced care planning/counseling discussion 10/21/2015   Coronary artery disease of native artery of native heart with stable angina pectoris (HCC)    DM2 (diabetes mellitus, type 2) (HCC) 08/12/2015   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   Hyperlipidemia 01/02/2015   Osteoarthritis 01/02/2015   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/02/2014    ONSET DATE: 02/22/23  REFERRING DIAG: Z86.73 (ICD-10-CM) - History of CVA  (cerebrovascular accident)   THERAPY DIAG:  Abnormality of gait and mobility  Muscle weakness (generalized)  Rationale for Evaluation and Treatment: Rehabilitation  SUBJECTIVE:                                                                                                                                                                                             SUBJECTIVE STATEMENT: Pt reports having a tough time at his Grandson's graduation.  He had a good time but it was a lot of activity so he was a little tired at times. Pt accompanied by: self  PERTINENT HISTORY: From Neurology 03/10/23 visit: Patient with past medical history significant for CAD (CABG times 03/22/2015), carotid stenosis, hypertension, hyperlipidemia, T2DM, history of CVA. He presented to the ED 02/22/2023 with acute onset right-sided weakness, he received TNK. Patient has no residual symptoms, he presents to establish care with our office. Results of cardiac monitor are pending, he is well-established with cardiology. Patient was taking aspirin 81 mg at the time of presentation to the ED.  Reviewed and discussed results from recent hospitalization including provider notes, CTA head and neck, echocardiogram, CT, MRI brain. Results below. CTA head and neck did reveal left ICA stenosis of 70%, right ICA stenosis of 60%.   PAIN:  Are you having pain? No  PRECAUTIONS: None  WEIGHT BEARING RESTRICTIONS: No  FALLS: Has patient fallen in last 6  months? No  LIVING ENVIRONMENT: Lives with: lives with their family and lives with their spouse Lives in: House/apartment Stairs: No Has following equipment at home: None  PLOF: Independent  PATIENT GOALS: To ensure he is physically at the level he was prior to the stroke in April.   OBJECTIVE:   DIAGNOSTIC FINDINGS: no acute findings from CVA  COGNITION: Overall cognitive status: Within functional limits for tasks assessed   SENSATION: Not  tested  COORDINATION: WNL  POSTURE: No Significant postural limitations  LOWER EXTREMITY ROM: WNL  LOWER EXTREMITY MMT:    MMT Right Eval Left Eval  Hip flexion 4 4+  Hip extension    Hip abduction 5 5  Hip adduction 5 5  Hip internal rotation 5 5  Hip external rotation 5 5  Knee flexion 4+ 4+  Knee extension 4+ 5  Ankle dorsiflexion 5 5  Ankle plantarflexion    Ankle inversion    Ankle eversion    (Blank rows = not tested)  BED MOBILITY:  WNL  TRANSFERS: Assistive device utilized: None  Sit to stand: Complete Independence Stand to sit: Complete Independence Chair to chair: Complete Independence Floor:  not tested     CURB:   Curb Comments: Reports hesitancy with curb   STAIRS: Level of Assistance: Modified independence Stair Negotiation Technique: Step to Pattern with Bilateral Rails Number of Stairs: 4  Height of Stairs: 6 in  Comments: step to pattern descending  GAIT: Gait pattern: decreased stride length Distance walked: 10 m Assistive device utilized: None Level of assistance: Complete Independence Comments:   FUNCTIONAL TESTS:  5 times sit to stand: 13.86 sec  6 minute walk test: test visit 2  10 meter walk test: 1 m/s Berg Balance Scale: 43 Dynamic Gait Index: 17    PATIENT SURVEYS:  FOTO 67 goal of 77   TODAY'S TREATMENT:                                                                                                                              Exercise/Activity Sets/ Reps/Time/ Resistance Assistance Charge type Comments- Unless otherwise stated, CGA was provided and gait belt donned in order to ensure pt safety   Nustep interval training  Level 1 : level 3; 1 min: 1 min  X 3 rounds with 1 min cool down   TE Assistance with setup and and adjusting resistance at appropriate times.   Airex step up  10 x ea  UE Neuro re-ed Difficulty with R LE foot clearance when leading with LLE( R LE trailing)   Step taps with AW  2 x 10 with 4# AW   No UE  NMR Focus on foot clearance  Blaze pod taps X 3 rounds CGA, no UE  NMR Improved with each round, 28, 33, and 39 with each round respectively.   Ambulation with AW  X 335 ft with 4# AW   TE Good foot clearance throughout patient does  have to focus on right foot clearance as he tends to drag initially but with cues patient is able to correct  Stepping over 1/2 foam rollers  X 4 in a row  X 2 laps, rest and another set of 2 laps   NMR Cues for more narrow base of support as patient tends did not widen base of support causing imbalance and single-leg phase of gait  Treatment provided this session   Pt educated throughout session about proper posture and technique with exercises. Improved exercise technique, movement at target joints, use of target muscles after min to mod verbal, visual, tactile cues. Note: Portions of this document were prepared using Dragon voice recognition software and although reviewed may contain unintentional dictation errors in syntax, grammar, or spelling.     PATIENT EDUCATION: Education details: POC Person educated: Patient Education method: Explanation Education comprehension: verbalized understanding  HOME EXERCISE PROGRAM: Access Code: [email or web address] URL: [email or web address] Date: 03/17/2023 Prepared by: Thresa Ross  Exercises - Standing Single Leg Stance with Counter Support  - 1 x daily - 7 x weekly - 2 sets - 30 second  hold - Tandem Stance  - 1 x daily - 7 x weekly - 2 sets - 30 second  hold - Forward Step Touch  - 1 x daily - 7 x weekly - 2 sets - 10 reps - Standing March with Counter Support  - 1 x daily - 7 x weekly - 2 sets - 10 reps  GOALS: Goals reviewed with patient? Yes  SHORT TERM GOALS: Target date: 04/07/2023     Patient will be independent in home exercise program to improve strength/mobility for better functional independence with ADLs. Baseline: No HEP currently  Goal status: INITIAL   LONG TERM GOALS: Target  date: 05/05/2023      1.  Patient will increase FOTO score to equal to or greater than  77   to demonstrate statistically significant improvement in mobility and quality of life.  Baseline: 67 Goal status: INITIAL   2.  Patient will increase Berg Balance score by > 6 points to demonstrate decreased fall risk during functional activities. Baseline: 43 Goal status: INITIAL   3.   Patient will improve DGI by 4 points to reduce fall risk and demonstrate improved transfer/gait ability. Baseline: 17 Goal status: INITIAL  4.   Patient will increase six minute walk test distance to >1000 for progression to community ambulator and improve gait ability Baseline: visit 2 test 03/17/23: 850 ft  Goal status: INITIAL    ASSESSMENT:  CLINICAL IMPRESSION: Patient presents to physical therapy with excellent motivation for completion of physical therapy activities.  Patient progresses with lower extremity endurance as well as proprioceptive training to improve patient's gait and balance.  Patient has increased difficulty with right lower extremity foot clearance on multiple balance challenges and this will to continue to be a good focal point in future sessions.Pt will continue to benefit from skilled physical therapy intervention to address impairments, improve QOL, and attain therapy goals.    OBJECTIVE IMPAIRMENTS: Abnormal gait, decreased activity tolerance, decreased balance, and difficulty walking.   ACTIVITY LIMITATIONS: lifting and locomotion level  PARTICIPATION LIMITATIONS: community activity, occupation, and yard work  PERSONAL FACTORS: Age are also affecting patient's functional outcome.   REHAB POTENTIAL: Excellent  CLINICAL DECISION MAKING: Stable/uncomplicated  EVALUATION COMPLEXITY: Low  PLAN:  PT FREQUENCY: 1-2x/week  PT DURATION: 8 weeks  PLANNED INTERVENTIONS: Therapeutic exercises, Therapeutic activity, Neuromuscular re-education, Balance training, Gait training,  Patient/Family education,  Self Care, Joint mobilization, Stair training, Dry Needling, Manual therapy, and Re-evaluation  PLAN FOR NEXT SESSION: Continue plan of care, focus on right lower extremity foot clearance as well as dynamic balance challenges.  Norman Herrlich, PT 03/24/2023, 4:17 PM

## 2023-03-30 ENCOUNTER — Ambulatory Visit: Payer: Medicare Other | Admitting: Physician Assistant

## 2023-03-31 ENCOUNTER — Ambulatory Visit: Payer: Medicare Other | Admitting: Physical Therapy

## 2023-03-31 DIAGNOSIS — R269 Unspecified abnormalities of gait and mobility: Secondary | ICD-10-CM

## 2023-03-31 DIAGNOSIS — M6281 Muscle weakness (generalized): Secondary | ICD-10-CM

## 2023-03-31 NOTE — Therapy (Signed)
OUTPATIENT PHYSICAL THERAPY NEURO TREATMENT   Patient Name: Shannon Chung MRN: [id number] DOB:1940-11-30, 82 y.o., male Today's Date: 04/01/2023   PCP: Dana Allan, MD  REFERRING PROVIDER:   Dana Allan, MD    END OF SESSION:  PT End of Session - 03/31/23 1436     Visit Number 4    Number of Visits 16    Date for PT Re-Evaluation 05/05/23    Progress Note Due on Visit 10    PT Start Time 1430    PT Stop Time 1513    PT Time Calculation (min) 43 min    Equipment Utilized During Treatment Gait belt    Activity Tolerance Patient tolerated treatment well    Behavior During Therapy WFL for tasks assessed/performed                Past Medical History:  Diagnosis Date   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 06/2015 Cardiac CT: Ca score 1103 (84th %'ile);  b. 07/2015 Cath: LM 70, LAD 80p, 100/24m, D1 70, D2 95, RI 75, RCA 100p/m;  c. 07/2015 CABG x 5 (LIMA->LAD, VG->Diag, VG->OM1->OM2, VG->OM3).   COVID-19    12/2021   COVID-19 01/18/2022   COVID-19 vaccine administered 01/18/2022   Unknown how many vaccine doses have been received. Entered from Emergency Triage Note.   Diabetes mellitus without complication (HCC) 07/2015   Dyslipidemia    Essential hypertension    Essential hypertension 01/02/2015   Formatting of this note might be different from the original.  Last Assessment & Plan:   Chronic, stable. Continue current regimen.   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 dilated, EF 35% improved to 45-50% (2015);  b. 07/2015 EF 25-35% by LV gram.   Ischemic cardiomyopathy 01/02/2015   Kidney stones 04/17/2021   Lone atrial fibrillation (HCC) 1983   a. isolated episode, not on OAC.   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 06/2014: LV; resolved with coumadin-->no residual on f/u echo, no longer on coumadin.   Mural thrombus of heart 08/26/2021   Formatting of this note might be different from the original. Jun 16, 2019 Entered By: [name] Comment:  left ventricle, cardiac apex   Osteoarthritis    a. 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 Jun 16, 2019 Entered By: [name] Comment: leftAug 19, 2020 Entered By: [name] Comment: resolved by total left knee replacement Jun 16, 2019 Entered By: [name] Comment: leftAug 19, 2020 Entered By: [name] Comment: resolved by total left knee replacement   Trigger finger of left hand 07/07/2019   Vitamin D deficiency    Past Surgical History:  Procedure Laterality Date   BICEPS TENDON REPAIR Right 1993   CARDIAC CATHETERIZATION N/A 07/05/2015   Procedure: Left Heart Cath and Coronary Angiography;  Surgeon: Antonieta Iba, MD;  Location: [facility] INVASIVE CV LAB;  Service: Cardiovascular;  Laterality: N/A;   CATARACT EXTRACTION Left 12/2016   with keratoplasty   COLONOSCOPY  2007   COLONOSCOPY WITH PROPOFOL N/A 12/02/2017   TA, SSA, rpt 3 yrs(Tahiliani, Varnita B, MD)   COLONOSCOPY WITH PROPOFOL N/A 11/26/2020   Procedure: COLONOSCOPY WITH PROPOFOL;  Surgeon: Midge Minium, MD;  Location: [facility] ENDOSCOPY;  Service: Endoscopy;  Laterality: N/A;   CORONARY ARTERY BYPASS GRAFT N/A 07/29/2015   Procedure: CORONARY ARTERY BYPASS GRAFTING (CABG) x 5 (LIMA to LAD, SVG to DIAGONAL,  SVG SEQUENTIALLY to OM1 and OM2, SVG to OM3) with Endoscopic Vein Havesting of  GREATER SAPHENOUS VEIN from RIGHT THIGH and partial LOWER LEG ;  Surgeon: Alleen Borne, MD;  Location: [facility] OR;  Service: Open Heart Surgery;  Laterality: N/A;   EYE SURGERY     b/l cataract and cornea replaced    HAND SURGERY     left hand 1st/2nd trigger fingers Dr. Hyacinth Meeker ortho    JOINT REPLACEMENT     KNEE ARTHROSCOPY Left remote   MOHS SURGERY     left cheek scc 2022 Dr. Jeannine Boga   MOHS SURGERY     x 5 facial scc   right biceps tendon     repair/re attachment    SKIN CANCER EXCISION  10/2015   BCC - L ala (pending MOHs) and L scapula (complete excision)   TEE WITHOUT CARDIOVERSION  N/A 07/29/2015   Procedure: TRANSESOPHAGEAL ECHOCARDIOGRAM (TEE);  Surgeon: Alleen Borne, MD;  Location: [facility] OR;  Service: Open Heart Surgery;  Laterality: N/A;   TONSILLECTOMY  1949   TOTAL KNEE ARTHROPLASTY Left 03/18/2016   cemented L TKR; Deeann Saint, MD   Patient Active Problem List   Diagnosis Date Noted   History of CVA (cerebrovascular accident) without residual deficits 03/04/2023   Acute CVA (cerebrovascular accident) (HCC) 02/22/2023   Chronic radicular pain of lower back 08/10/2022   Cervical spondylosis 03/11/2022   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   Bilateral carotid artery stenosis 03/11/2022  Lumbar spondylosis 10/07/2021   DDD (degenerative disc disease), lumbar 10/07/2021   History of radiation therapy 08/26/2021   Aortic atherosclerosis (HCC) 04/17/2021   Diverticulosis 04/17/2021   Hypertension associated with diabetes (HCC) 10/07/2020   Overweight (BMI 25.0-29.9) 10/07/2020   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   Vitamin D deficiency 08/01/2020   Polyp of sigmoid colon 08/01/2020   Insomnia 06/07/2020   Gastroesophageal reflux disease 04/04/2020   Degenerative joint disease of hand 03/01/2020   BPH (benign prostatic hyperplasia) 08/05/2018   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   Macular scar of both eyes 04/20/2018   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   Scotoma involving central area of both eyes 04/20/2018   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   Fatty liver 03/25/2018   Erectile dysfunction 02/23/2017   Hx of CABG 01/24/2017   Advanced care planning/counseling discussion 10/21/2015   Coronary artery disease of native artery of native heart with stable angina pectoris (HCC)    DM2 (diabetes mellitus, type 2) (HCC) 08/12/2015   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   Hyperlipidemia 01/02/2015   Osteoarthritis 01/02/2015   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/02/2014    ONSET DATE: 02/22/23  REFERRING DIAG: Z86.73 (ICD-10-CM) - History of CVA  (cerebrovascular accident)   THERAPY DIAG:  Abnormality of gait and mobility  Muscle weakness (generalized)  Rationale for Evaluation and Treatment: Rehabilitation  SUBJECTIVE:                                                                                                                                                                                             SUBJECTIVE STATEMENT: Pt reports having a good weekend with some get together with family. No falls or LOB.  Pt accompanied by: self  PERTINENT HISTORY: From Neurology 03/10/23 visit: Patient with past medical history significant for CAD (CABG times 03/22/2015), carotid stenosis, hypertension, hyperlipidemia, T2DM, history of CVA. He presented to the ED 02/22/2023 with acute onset right-sided weakness, he received TNK. Patient has no residual symptoms, he presents to establish care with our office. Results of cardiac monitor are pending, he is well-established with cardiology. Patient was taking aspirin 81 mg at the time of presentation to the ED.  Reviewed and discussed results from recent hospitalization including provider notes, CTA head and neck, echocardiogram, CT, MRI brain. Results below. CTA head and neck did reveal left ICA stenosis of 70%, right ICA stenosis of 60%.   PAIN:  Are you having pain? No  PRECAUTIONS: None  WEIGHT BEARING RESTRICTIONS: No  FALLS: Has patient fallen in last 6 months? No  LIVING ENVIRONMENT: Lives with: lives with their family and lives with  their spouse Lives in: House/apartment Stairs: No Has following equipment at home: None  PLOF: Independent  PATIENT GOALS: To ensure he is physically at the level he was prior to the stroke in April.   OBJECTIVE:   DIAGNOSTIC FINDINGS: no acute findings from CVA  COGNITION: Overall cognitive status: Within functional limits for tasks assessed   SENSATION: Not tested  COORDINATION: WNL  POSTURE: No Significant postural limitations  LOWER  EXTREMITY ROM: WNL  LOWER EXTREMITY MMT:    MMT Right Eval Left Eval  Hip flexion 4 4+  Hip extension    Hip abduction 5 5  Hip adduction 5 5  Hip internal rotation 5 5  Hip external rotation 5 5  Knee flexion 4+ 4+  Knee extension 4+ 5  Ankle dorsiflexion 5 5  Ankle plantarflexion    Ankle inversion    Ankle eversion    (Blank rows = not tested)  BED MOBILITY:  WNL  TRANSFERS: Assistive device utilized: None  Sit to stand: Complete Independence Stand to sit: Complete Independence Chair to chair: Complete Independence Floor:  not tested     CURB:   Curb Comments: Reports hesitancy with curb   STAIRS: Level of Assistance: Modified independence Stair Negotiation Technique: Step to Pattern with Bilateral Rails Number of Stairs: 4  Height of Stairs: 6 in  Comments: step to pattern descending  GAIT: Gait pattern: decreased stride length Distance walked: 10 m Assistive device utilized: None Level of assistance: Complete Independence Comments:   FUNCTIONAL TESTS:  5 times sit to stand: 13.86 sec  6 minute walk test: test visit 2  10 meter walk test: 1 m/s Berg Balance Scale: 43 Dynamic Gait Index: 17    PATIENT SURVEYS:  FOTO 67 goal of 77   TODAY'S TREATMENT:                                                                                                                              Exercise/Activity Sets/ Reps/Time/ Resistance Assistance Charge type Comments- Unless otherwise stated, CGA was provided and gait belt donned in order to ensure pt safety   Nustep interval training  Level 1 : level 3; 1 min: 1 min  X 3 rounds   TE Assistance with setup and and adjusting resistance at appropriate times.   Airex step up  2*10 ea LE UE Neuro re-ed Difficulty with R LE foot clearance on first round, cues for slow and contorlled movement to further challenge stability.   Step taps to 6 in step with  2 x 10 interval with the below No UE  NMR Focus on foot clearance   Walking with cues for step length and foot clearance 2 x 150 feet   Gait training Difficulty with equal step length, increased difficulty with turns   Blaze pod- step over 1/2 foam roll X 3 rounds CGA, no UE  NMR Difficulty with heel clearance at times, improved ability with practice and reps  Step length training for improved gait   X many minutes of each ( see comments)  Gait training  Hedgehogs lined up to optimize step length x many reps through  Following this pt adapted well and then challenged to continue to walk with this pattern, lasted for a few steps ( 3-4) then deteriorated ot his normal gait Step over 1/2 foam rollers x 3 in a row x many reps Step length game with negative splits on 74M walk area x 8 rounds, started with 20 steps in 10 M then worked down to 16-17 steps on last reps.         Treatment provided this session   Pt educated throughout session about proper posture and technique with exercises. Improved exercise technique, movement at target joints, use of target muscles after min to mod verbal, visual, tactile cues. Note: Portions of this document were prepared using Dragon voice recognition software and although reviewed may contain unintentional dictation errors in syntax, grammar, or spelling.     PATIENT EDUCATION: Education details: POC Person educated: Patient Education method: Explanation Education comprehension: verbalized understanding  HOME EXERCISE PROGRAM: Access Code: [email or web address] URL: [email or web address] Date: 03/17/2023 Prepared by: Thresa Ross  Exercises - Standing Single Leg Stance with Counter Support  - 1 x daily - 7 x weekly - 2 sets - 30 second  hold - Tandem Stance  - 1 x daily - 7 x weekly - 2 sets - 30 second  hold - Forward Step Touch  - 1 x daily - 7 x weekly - 2 sets - 10 reps - Standing March with Counter Support  - 1 x daily - 7 x weekly - 2 sets - 10 reps  GOALS: Goals reviewed with patient? Yes  SHORT  TERM GOALS: Target date: 04/07/2023     Patient will be independent in home exercise program to improve strength/mobility for better functional independence with ADLs. Baseline: No HEP currently  Goal status: INITIAL   LONG TERM GOALS: Target date: 05/05/2023      1.  Patient will increase FOTO score to equal to or greater than  77   to demonstrate statistically significant improvement in mobility and quality of life.  Baseline: 67 Goal status: INITIAL   2.  Patient will increase Berg Balance score by > 6 points to demonstrate decreased fall risk during functional activities. Baseline: 43 Goal status: INITIAL   3.   Patient will improve DGI by 4 points to reduce fall risk and demonstrate improved transfer/gait ability. Baseline: 17 Goal status: INITIAL  4.   Patient will increase six minute walk test distance to >1000 for progression to community ambulator and improve gait ability Baseline: visit 2 test 03/17/23: 850 ft  Goal status: INITIAL    ASSESSMENT:  CLINICAL IMPRESSION: Patient presents to physical therapy with excellent motivation for completion of physical therapy activities.  Patient progresses with lower extremity endurance as well as proprioceptive training to improve patient's gait and balance.  Patient has increased difficulty with right lower extremity foot clearance on multiple balance challenges and this will to continue to be a good focal point in future sessions.Pt also challenged with equalizing step length, he was able to take several steps with close to equal step lengths but was not able to maintain consistently. Pt will continue to benefit from skilled physical therapy intervention to address impairments, improve QOL, and attain therapy goals.    OBJECTIVE IMPAIRMENTS: Abnormal gait, decreased activity tolerance, decreased balance, and difficulty walking.  ACTIVITY LIMITATIONS: lifting and locomotion level  PARTICIPATION LIMITATIONS: community  activity, occupation, and yard work  PERSONAL FACTORS: Age are also affecting patient's functional outcome.   REHAB POTENTIAL: Excellent  CLINICAL DECISION MAKING: Stable/uncomplicated  EVALUATION COMPLEXITY: Low  PLAN:  PT FREQUENCY: 1-2x/week  PT DURATION: 8 weeks  PLANNED INTERVENTIONS: Therapeutic exercises, Therapeutic activity, Neuromuscular re-education, Balance training, Gait training, Patient/Family education, Self Care, Joint mobilization, Stair training, Dry Needling, Manual therapy, and Re-evaluation  PLAN FOR NEXT SESSION: Continue plan of care, focus on right lower extremity foot clearance as well as dynamic balance challenges.  Norman Herrlich, PT 04/01/2023, 7:50 AM

## 2023-04-01 ENCOUNTER — Encounter: Payer: Self-pay | Admitting: Physical Therapy

## 2023-04-01 ENCOUNTER — Telehealth (INDEPENDENT_AMBULATORY_CARE_PROVIDER_SITE_OTHER): Payer: Self-pay

## 2023-04-01 NOTE — Telephone Encounter (Signed)
Patient called about an update for a referral that was sent on May 18 th from Neurology. Call what transferred to [facility].

## 2023-04-07 ENCOUNTER — Ambulatory Visit: Payer: Medicare Other | Attending: Family Medicine | Admitting: Physical Therapy

## 2023-04-07 ENCOUNTER — Encounter: Payer: Self-pay | Admitting: Physical Therapy

## 2023-04-07 DIAGNOSIS — M25552 Pain in left hip: Secondary | ICD-10-CM | POA: Diagnosis present

## 2023-04-07 DIAGNOSIS — M6281 Muscle weakness (generalized): Secondary | ICD-10-CM | POA: Diagnosis present

## 2023-04-07 DIAGNOSIS — R269 Unspecified abnormalities of gait and mobility: Secondary | ICD-10-CM | POA: Insufficient documentation

## 2023-04-07 DIAGNOSIS — M545 Low back pain, unspecified: Secondary | ICD-10-CM | POA: Diagnosis present

## 2023-04-07 NOTE — Therapy (Signed)
OUTPATIENT PHYSICAL THERAPY NEURO TREATMENT   Patient Name: Shannon Chung MRN: [id number] DOB:[birth year], 82 y.o., male Today's Date: 04/07/2023   PCP: Dana Allan, MD  REFERRING PROVIDER:   Dana Allan, MD    END OF SESSION:  PT End of Session - 04/07/23 0952     Visit Number 5    Number of Visits 16    Date for PT Re-Evaluation 05/05/23    Progress Note Due on Visit 10    PT Start Time 0931    PT Stop Time 1014    PT Time Calculation (min) 43 min    Equipment Utilized During Treatment Gait belt    Activity Tolerance Patient tolerated treatment well    Behavior During Therapy [facility] for tasks assessed/performed                Past Medical History:  Diagnosis Date   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 06/2015 Cardiac CT: Ca score 1103 (84th %'ile);  b. 07/2015 Cath: LM 70, LAD 80p, 100/14m, D1 70, D2 95, RI 75, RCA 100p/m;  c. 07/2015 CABG x 5 (LIMA->LAD, VG->Diag, VG->OM1->OM2, VG->OM3).   COVID-19    12/2021   COVID-19 01/18/2022   COVID-19 vaccine administered 01/18/2022   Unknown how many vaccine doses have been received. Entered from Emergency Triage Note.   Diabetes mellitus without complication (HCC) 07/2015   Dyslipidemia    Essential hypertension    Essential hypertension 01/02/2015   Formatting of this note might be different from the original.  Last Assessment & Plan:   Chronic, stable. Continue current regimen.   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 dilated, EF 35% improved to 45-50% (2015);  b. 07/2015 EF 25-35% by LV gram.   Ischemic cardiomyopathy 01/02/2015   Kidney stones 04/17/2021   Lone atrial fibrillation (HCC) 1983   a. isolated episode, not on OAC.   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 06/2014: LV; resolved with coumadin-->no residual on f/u echo, no longer on coumadin.   Mural thrombus of heart 08/26/2021   Formatting of this note might be different from the original. Jun 16, 2019 Entered By: [name] Comment:  left ventricle, cardiac apex   Osteoarthritis    a. 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 Jun 16, 2019 Entered By: [name] Comment: leftAug 19, 2020 Entered By: [name] Comment: resolved by total left knee replacement Jun 16, 2019 Entered By: [name] Comment: leftAug 19, 2020 Entered By: [name] Comment: resolved by total left knee replacement   Trigger finger of left hand 07/07/2019   Vitamin D deficiency    Past Surgical History:  Procedure Laterality Date   BICEPS TENDON REPAIR Right 1993   CARDIAC CATHETERIZATION N/A 07/05/2015   Procedure: Left Heart Cath and Coronary Angiography;  Surgeon: Antonieta Iba, MD;  Location: [facility] INVASIVE CV LAB;  Service: Cardiovascular;  Laterality: N/A;   CATARACT EXTRACTION Left 12/2016   with keratoplasty   COLONOSCOPY  2007   COLONOSCOPY WITH PROPOFOL N/A 12/02/2017   TA, SSA, rpt 3 yrs(Tahiliani, Varnita B, MD)   COLONOSCOPY WITH PROPOFOL N/A 11/26/2020   Procedure: COLONOSCOPY WITH PROPOFOL;  Surgeon: Midge Minium, MD;  Location: [facility] ENDOSCOPY;  Service: Endoscopy;  Laterality: N/A;   CORONARY ARTERY BYPASS GRAFT N/A 07/29/2015   Procedure: CORONARY ARTERY BYPASS GRAFTING (CABG) x 5 (LIMA to LAD, SVG to DIAGONAL,  SVG SEQUENTIALLY to OM1 and OM2, SVG to OM3) with Endoscopic Vein Havesting of  GREATER SAPHENOUS VEIN from RIGHT THIGH and partial LOWER LEG ;  Surgeon: Alleen Borne, MD;  Location: [facility] OR;  Service: Open Heart Surgery;  Laterality: N/A;   EYE SURGERY     b/l cataract and cornea replaced    HAND SURGERY     left hand 1st/2nd trigger fingers Dr. Hyacinth Meeker ortho    JOINT REPLACEMENT     KNEE ARTHROSCOPY Left remote   MOHS SURGERY     left cheek scc 2022 Dr. Jeannine Boga   MOHS SURGERY     x 5 facial scc   right biceps tendon     repair/re attachment    SKIN CANCER EXCISION  10/2015   BCC - L ala (pending MOHs) and L scapula (complete excision)   TEE WITHOUT CARDIOVERSION  N/A 07/29/2015   Procedure: TRANSESOPHAGEAL ECHOCARDIOGRAM (TEE);  Surgeon: Alleen Borne, MD;  Location: [facility] OR;  Service: Open Heart Surgery;  Laterality: N/A;   TONSILLECTOMY  1949   TOTAL KNEE ARTHROPLASTY Left 03/18/2016   cemented L TKR; Deeann Saint, MD   Patient Active Problem List   Diagnosis Date Noted   History of CVA (cerebrovascular accident) without residual deficits 03/04/2023   Acute CVA (cerebrovascular accident) (HCC) 02/22/2023   Chronic radicular pain of lower back 08/10/2022   Cervical spondylosis 03/11/2022   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   Bilateral carotid artery stenosis 03/11/2022  Lumbar spondylosis 10/07/2021   DDD (degenerative disc disease), lumbar 10/07/2021   History of radiation therapy 08/26/2021   Aortic atherosclerosis (HCC) 04/17/2021   Diverticulosis 04/17/2021   Hypertension associated with diabetes (HCC) 10/07/2020   Overweight (BMI 25.0-29.9) 10/07/2020   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   Vitamin D deficiency 08/01/2020   Polyp of sigmoid colon 08/01/2020   Insomnia 06/07/2020   Gastroesophageal reflux disease 04/04/2020   Degenerative joint disease of hand 03/01/2020   BPH (benign prostatic hyperplasia) 08/05/2018   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   Macular scar of both eyes 04/20/2018   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   Scotoma involving central area of both eyes 04/20/2018   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  Rationale for Evaluation and Treatment: Rehabilitation  SUBJECTIVE:                                                                                                                                                                                             SUBJECTIVE STATEMENT:  Pt reports no changes since last session. He feels his legs are feeling good today.    Pt accompanied by: self  PERTINENT HISTORY: From Neurology 03/10/23 visit: Patient with past medical history significant for CAD (CABG times 03/22/2015), carotid stenosis, hypertension, hyperlipidemia, T2DM, history of CVA. He presented to the ED 02/22/2023 with acute onset right-sided weakness, he received TNK. Patient has no residual symptoms, he presents to establish care with our office. Results of cardiac monitor are pending, he is well-established with cardiology. Patient was taking aspirin 81 mg at the time of presentation to the ED.  Reviewed and discussed results from recent hospitalization including provider notes, CTA head and neck, echocardiogram, CT, MRI brain. Results below. CTA head and neck did reveal left ICA stenosis of 70%, right ICA stenosis of 60%.   PAIN:  Are you having pain? No  PRECAUTIONS: None  WEIGHT BEARING RESTRICTIONS: No  FALLS: Has patient fallen in last 6 months? No  LIVING ENVIRONMENT: Lives with: lives with their family and lives with their spouse Lives in:  House/apartment Stairs: No Has following equipment at home: None  PLOF: Independent  PATIENT GOALS: To ensure he is physically at the level he was prior to the stroke in April.   OBJECTIVE:   DIAGNOSTIC FINDINGS: no acute findings from CVA  COGNITION: Overall cognitive status: Within functional limits for tasks assessed   SENSATION: Not tested  COORDINATION: WNL  POSTURE: No Significant postural limitations  LOWER EXTREMITY ROM: WNL  LOWER EXTREMITY MMT:     MMT Right Eval Left Eval  Hip flexion 4 4+  Hip extension    Hip abduction 5 5  Hip adduction 5 5  Hip internal rotation 5 5  Hip external rotation 5 5  Knee flexion 4+ 4+  Knee extension 4+ 5  Ankle dorsiflexion 5 5  Ankle plantarflexion    Ankle inversion    Ankle eversion    (Blank rows = not tested)  BED MOBILITY:  WNL  TRANSFERS: Assistive device utilized: None  Sit to stand: Complete Independence Stand to sit: Complete Independence Chair to chair: Complete Independence Floor:  not tested     CURB:   Curb Comments: Reports hesitancy with curb   STAIRS: Level of Assistance: Modified independence Stair Negotiation Technique: Step to Pattern with Bilateral Rails Number of Stairs: 4  Height of Stairs: 6 in  Comments: step to pattern descending  GAIT: Gait pattern: decreased stride length Distance walked: 10 m Assistive device utilized: None Level of assistance: Complete Independence Comments:   FUNCTIONAL TESTS:  5 times sit to stand: 13.86 sec  6 minute walk test: test visit 2  10 meter walk test: 1 m/s Berg Balance Scale: 43 Dynamic Gait Index: 17    PATIENT SURVEYS:  FOTO 67 goal of 77   TODAY'S TREATMENT:                                                                                                                              Exercise/Activity Sets/ Reps/Time/ Resistance Assistance Charge type Comments- Unless otherwise stated, CGA was provided and gait belt donned in order to ensure pt safety   Nustep interval training  Level 1 : level 3; 1 min: 2 min  X 2 rounds   TE Assistance with setup and and adjusting resistance at appropriate times.   Balance obstacle course - 1/2 foam rolls, treadmill, airex, stretch of long step length X4  times through  Rest  2 more times through   NMR Difficulty with a/p weight shifting with obstacles and also fatigue from completing at end of session may have impacted pt stability.  LOB requiring mod A for  recovery when stepping on/ off airex pad, difficulty with stepping on 1/2 foam roll, airex and treadmill.  Blaze pod- steps ant with 3 pods and 1 pod on floor lateral  X 2 rounds ea LE CGA, intermittent UE NMR Cues for foot clearance, The Blaze Pod Random setting was chosen to enhance cognitive processing and agility, providing  an unpredictable environment to simulate real-world scenarios, and fostering quick reactions and adaptability.    Negative split step count on 10 MWT distance   X 8 attempts  Back to blaze pods X 4 more attempts   Gait training  Progressed from 20 to 17 step, cues for focus on stance time and RLE step length         Treatment provided this session   Pt educated throughout session about proper posture and technique with exercises. Improved exercise technique, movement at target joints, use of target muscles after min to mod verbal, visual, tactile cues.  Note: Portions of this document were prepared using Dragon voice recognition software and although reviewed may contain unintentional dictation errors in syntax, grammar, or spelling.     PATIENT EDUCATION: Education details: POC Person educated: Patient Education method: Explanation Education comprehension: verbalized understanding  HOME EXERCISE PROGRAM: Access Code: [email or web address] URL: [email or web address] Date: 03/17/2023 Prepared by: Thresa Ross  Exercises - Standing Single Leg Stance with Counter Support  - 1 x daily - 7 x weekly - 2 sets - 30 second  hold - Tandem Stance  - 1 x daily - 7 x weekly - 2 sets - 30 second  hold - Forward Step Touch  - 1 x daily - 7 x weekly - 2 sets - 10 reps - Standing March with Counter Support  - 1 x daily - 7 x weekly - 2 sets - 10 reps  GOALS: Goals reviewed with patient? Yes  SHORT TERM GOALS: Target date: 04/07/2023     Patient will be independent in home exercise program to improve strength/mobility for better functional independence with  ADLs. Baseline: No HEP currently  Goal status: INITIAL   LONG TERM GOALS: Target date: 05/05/2023      1.  Patient will increase FOTO score to equal to or greater than  77   to demonstrate statistically significant improvement in mobility and quality of life.  Baseline: 67 Goal status: INITIAL   2.  Patient will increase Berg Balance score by > 6 points to demonstrate decreased fall risk during functional activities. Baseline: 43 Goal status: INITIAL   3.   Patient will improve DGI by 4 points to reduce fall risk and demonstrate improved transfer/gait ability. Baseline: 17 Goal status: INITIAL  4.   Patient will increase six minute walk test distance to >1000 for progression to community ambulator and improve gait ability Baseline: visit 2 test 03/17/23: 850 ft  Goal status: INITIAL    ASSESSMENT:  CLINICAL IMPRESSION:  Patient presents to physical therapy with excellent motivation for completion of physical therapy activities. Patient progresses with lower extremity endurance as well as proprioceptive training to improve patient's gait and balance.  Patient has increased difficulty with right lower extremity foot clearance on multiple balance challenges and this will to continue to be a good focal point in future sessions.Pt also challenged with equalizing step length, he was able to take several steps with close to equal step lengths but was not able to maintain consistently. Pt will continue to benefit from skilled physical therapy intervention to address impairments, improve QOL, and attain therapy goals.    OBJECTIVE IMPAIRMENTS: Abnormal gait, decreased activity tolerance, decreased balance, and difficulty walking.   ACTIVITY LIMITATIONS: lifting and locomotion level  PARTICIPATION LIMITATIONS: community activity, occupation, and yard work  PERSONAL FACTORS: Age are also affecting patient's functional outcome.   REHAB POTENTIAL: Excellent  CLINICAL DECISION MAKING:  Stable/uncomplicated  EVALUATION COMPLEXITY: Low  PLAN:  PT FREQUENCY: 1-2x/week  PT DURATION: 8 weeks  PLANNED INTERVENTIONS: Therapeutic exercises, Therapeutic activity, Neuromuscular re-education, Balance training, Gait training, Patient/Family education, Self Care, Joint mobilization, Stair training, Dry Needling, Manual therapy, and Re-evaluation  PLAN FOR NEXT SESSION: Continue plan of care, focus on right lower extremity foot clearance as well as dynamic balance challenges.  Norman Herrlich, PT 04/07/2023, 9:52 AM

## 2023-04-14 ENCOUNTER — Encounter: Payer: Self-pay | Admitting: Physical Therapy

## 2023-04-14 ENCOUNTER — Ambulatory Visit: Payer: Medicare Other | Admitting: Physical Therapy

## 2023-04-14 DIAGNOSIS — M6281 Muscle weakness (generalized): Secondary | ICD-10-CM

## 2023-04-14 DIAGNOSIS — R269 Unspecified abnormalities of gait and mobility: Secondary | ICD-10-CM | POA: Diagnosis not present

## 2023-04-14 DIAGNOSIS — M25552 Pain in left hip: Secondary | ICD-10-CM

## 2023-04-14 DIAGNOSIS — M545 Low back pain, unspecified: Secondary | ICD-10-CM

## 2023-04-14 NOTE — Therapy (Signed)
OUTPATIENT PHYSICAL THERAPY NEURO TREATMENT   Patient Name: Shannon Chung MRN: [id number] DOB:November 04, 1940, 82 y.o., male Today's Date: 04/14/2023   PCP: Dana Allan, MD  REFERRING PROVIDER:   Dana Allan, MD    END OF SESSION:  PT End of Session - 04/14/23 1133     Visit Number 6    Number of Visits 16    Date for PT Re-Evaluation 05/05/23    Progress Note Due on Visit 10    PT Start Time 1145    PT Stop Time 1229    PT Time Calculation (min) 44 min    Equipment Utilized During Treatment Gait belt    Activity Tolerance Patient tolerated treatment well    Behavior During Therapy WFL for tasks assessed/performed                 Past Medical History:  Diagnosis Date   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 06/2015 Cardiac CT: Ca score 1103 (84th %'ile);  b. 07/2015 Cath: LM 70, LAD 80p, 100/49m, D1 70, D2 95, RI 75, RCA 100p/m;  c. 07/2015 CABG x 5 (LIMA->LAD, VG->Diag, VG->OM1->OM2, VG->OM3).   COVID-19    12/2021   COVID-19 01/18/2022   COVID-19 vaccine administered 01/18/2022   Unknown how many vaccine doses have been received. Entered from Emergency Triage Note.   Diabetes mellitus without complication (HCC) 07/2015   Dyslipidemia    Essential hypertension    Essential hypertension 01/02/2015   Formatting of this note might be different from the original.  Last Assessment & Plan:   Chronic, stable. Continue current regimen.   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 dilated, EF 35% improved to 45-50% (2015);  b. 07/2015 EF 25-35% by LV gram.   Ischemic cardiomyopathy 01/02/2015   Kidney stones 04/17/2021   Lone atrial fibrillation (HCC) 1983   a. isolated episode, not on OAC.   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 06/2014: LV; resolved with coumadin-->no residual on f/u echo, no longer on coumadin.   Mural thrombus of heart 08/26/2021   Formatting of this note might be different from the original. Jun 16, 2019 Entered By: [name]  Comment: left ventricle, cardiac apex   Osteoarthritis    a. 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 Jun 16, 2019 Entered By: [name] Comment: leftAug 19, 2020 Entered By: [name] Comment: resolved by total left knee replacement Jun 16, 2019 Entered By: [name] Comment: leftAug 19, 2020 Entered By: [name] Comment: resolved by total left knee replacement   Trigger finger of left hand 07/07/2019   Vitamin D deficiency    Past Surgical History:  Procedure Laterality Date   BICEPS TENDON REPAIR Right 1993   CARDIAC CATHETERIZATION N/A 07/05/2015   Procedure: Left Heart Cath and Coronary Angiography;  Surgeon: Antonieta Iba, MD;  Location: [facility] INVASIVE CV LAB;  Service: Cardiovascular;  Laterality: N/A;   CATARACT EXTRACTION Left 12/2016   with keratoplasty   COLONOSCOPY  2007   COLONOSCOPY WITH PROPOFOL N/A 12/02/2017   TA, SSA, rpt 3 yrs(Tahiliani, Varnita B, MD)   COLONOSCOPY WITH PROPOFOL N/A 11/26/2020   Procedure: COLONOSCOPY WITH PROPOFOL;  Surgeon: Midge Minium, MD;  Location: [facility] ENDOSCOPY;  Service: Endoscopy;  Laterality: N/A;   CORONARY ARTERY BYPASS GRAFT N/A 07/29/2015   Procedure: CORONARY ARTERY BYPASS GRAFTING (CABG) x 5 (LIMA to LAD, SVG to DIAGONAL,  SVG SEQUENTIALLY to OM1 and OM2, SVG to OM3) with Endoscopic Vein Havesting of  GREATER SAPHENOUS VEIN from RIGHT THIGH and partial LOWER LEG ;  Surgeon: Alleen Borne, MD;  Location: [facility] OR;  Service: Open Heart Surgery;  Laterality: N/A;   EYE SURGERY     b/l cataract and cornea replaced    HAND SURGERY     left hand 1st/2nd trigger fingers Dr. Hyac[facility] Meeker ortho    JOINT REPLACEMENT     KNEE ARTHROSCOPY Left remote   MOHS SURGERY     left cheek scc 2022 Dr. Jeannine Boga   MOHS SURGERY     x 5 facial scc   right biceps tendon     repair/re attachment    SKIN CANCER EXCISION  10/2015   BCC - L ala (pending MOHs) and L scapula (complete excision)   TEE WITHOUT CARDIOVERSION  N/A 07/29/2015   Procedure: TRANSESOPHAGEAL ECHOCARDIOGRAM (TEE);  Surgeon: Alleen Borne, MD;  Location: [facility] OR;  Service: Open Heart Surgery;  Laterality: N/A;   TONSILLECTOMY  1949   TOTAL KNEE ARTHROPLASTY Left 03/18/2016   cemented L TKR; Deeann Saint, MD   Patient Active Problem List   Diagnosis Date Noted   History of CVA (cerebrovascular accident) without residual deficits 03/04/2023   Acute CVA (cerebrovascular accident) (HCC) 02/22/2023   Chronic radicular pain of lower back 08/10/2022   Cervical spondylosis 03/11/2022   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   Bilateral carotid artery stenosis  03/11/2022   Lumbar spondylosis 10/07/2021   DDD (degenerative disc disease), lumbar 10/07/2021   History of radiation therapy 08/26/2021   Aortic atherosclerosis (HCC) 04/17/2021   Diverticulosis 04/17/2021   Hypertension associated with diabetes (HCC) 10/07/2020   Overweight (BMI 25.0-29.9) 10/07/2020   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   Vitamin D deficiency 08/01/2020   Polyp of sigmoid colon 08/01/2020   Insomnia 06/07/2020   Gastroesophageal reflux disease 04/04/2020   Degenerative joint disease of hand 03/01/2020   BPH (benign prostatic hyperplasia) 08/05/2018   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   Macular scar of both eyes 04/20/2018   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   Scotoma involving central area of both eyes 04/20/2018   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   Fatty liver 03/25/2018   Erectile dysfunction 02/23/2017   Hx of CABG 01/24/2017   Advanced care planning/counseling discussion 10/21/2015   Coronary artery disease of native artery of native heart with stable angina pectoris (HCC)    DM2 (diabetes mellitus, type 2) (HCC) 08/12/2015   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   Hyperlipidemia 01/02/2015   Osteoarthritis 01/02/2015   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/02/2014    ONSET DATE: 02/22/23  REFERRING DIAG: Z86.73 (ICD-10-CM) - History of CVA  (cerebrovascular accident)   THERAPY DIAG:  Abnormality of gait and mobility  Muscle weakness (generalized)  Pain in left hip  Bilateral low back pain without sciatica, unspecified chronicity  Rationale for Evaluation and Treatment: Rehabilitation  SUBJECTIVE:                                                                                                                                                                                             SUBJECTIVE STATEMENT: Pt has been doing well, no significant changes since last session.   Pt accompanied by: self  PERTINENT HISTORY: From Neurology 03/10/23 visit: Patient with past medical history significant for CAD (CABG times 03/22/2015), carotid stenosis, hypertension, hyperlipidemia, T2DM, history of CVA. He presented to the ED 02/22/2023 with acute onset right-sided weakness, he received TNK. Patient has no residual symptoms, he presents to establish care with our office. Results of cardiac monitor are pending, he is well-established with cardiology. Patient was taking aspirin 81 mg at the time of presentation to the ED.  Reviewed and discussed results from recent hospitalization including provider notes, CTA head and neck, echocardiogram, CT, MRI brain. Results below. CTA head and neck did reveal left ICA stenosis of 70%, right ICA stenosis of 60%.   PAIN:  Are you having pain? No  PRECAUTIONS: None  WEIGHT BEARING RESTRICTIONS: No  FALLS: Has patient fallen in last 6 months?  No  LIVING ENVIRONMENT: Lives with: lives with their family and lives with their spouse Lives in: House/apartment Stairs: No Has following equipment at home: None  PLOF: Independent  PATIENT GOALS: To ensure he is physically at the level he was prior to the stroke in April.   OBJECTIVE:   DIAGNOSTIC FINDINGS: no acute findings from CVA  COGNITION: Overall cognitive status: Within functional limits for tasks assessed   SENSATION: Not  tested  COORDINATION: WNL  POSTURE: No Significant postural limitations  LOWER EXTREMITY ROM: WNL  LOWER EXTREMITY MMT:    MMT Right Eval Left Eval  Hip flexion 4 4+  Hip extension    Hip abduction 5 5  Hip adduction 5 5  Hip internal rotation 5 5  Hip external rotation 5 5  Knee flexion 4+ 4+  Knee extension 4+ 5  Ankle dorsiflexion 5 5  Ankle plantarflexion    Ankle inversion    Ankle eversion    (Blank rows = not tested)  BED MOBILITY:  WNL  TRANSFERS: Assistive device utilized: None  Sit to stand: Complete Independence Stand to sit: Complete Independence Chair to chair: Complete Independence Floor:  not tested     CURB:   Curb Comments: Reports hesitancy with curb   STAIRS: Level of Assistance: Modified independence Stair Negotiation Technique: Step to Pattern with Bilateral Rails Number of Stairs: 4  Height of Stairs: 6 in  Comments: step to pattern descending  GAIT: Gait pattern: decreased stride length Distance walked: 10 m Assistive device utilized: None Level of assistance: Complete Independence Comments:   FUNCTIONAL TESTS:  5 times sit to stand: 13.86 sec  6 minute walk test: test visit 2  10 meter walk test: 1 m/s Berg Balance Scale: 43 Dynamic Gait Index: 17    PATIENT SURVEYS:  FOTO 67 goal of 77   TODAY'S TREATMENT:                                                                                                                              Exercise/Activity Sets/ Reps/Time/ Resistance Assistance Charge type Comments- Unless otherwise stated, CGA was provided and gait belt donned in order to ensure pt safety   Nustep interval training Level 1 : level 2; 1 min each  X 3 rounds   TE Assistance with setup and and adjusting resistance at appropriate times.   Step ups, side steps, and retro stepping  on airex pad at balance bar X10 reps for each  NMR Exercise targeting balance and biggest chalenge from obstacle course previous  session. Cues for slow and controlled transitions, pt responds well.  Blaze pod- steps 3 pods, one ant on step, lateral on ground, and posterior on ground. X 2 rounds ea LE, 20 taps 1st round with 4#AW CGA, intermittent UE NMR Cues for foot clearance, The Blaze Pod Random setting was chosen to enhance cognitive processing and agility, providing an unpredictable environment to simulate real-world scenarios, and fostering  quick reactions and adaptability. Posterior stepping is most challenging for pt. Showing apprehension more on LLE step   Ambulation with AW  328ft, 4# AW  Gait training Cues for foot clearance and narrowing BOS  Negative split step count on 10 MWT distance   Done 2x during AW ambulation,   Gait training  Progressed from 25 steps walking naturally to 19 steps after directed cues for focus on stance time and step length  Stair taps with AW  4# AW, 10x each side   TE Focus with cues on foot clearance, minimizing use of UE.  Treatment provided this session   Pt educated throughout session about proper posture and technique with exercises. Improved exercise technique, movement at target joints, use of target muscles after min to mod verbal, visual, tactile cues.  Note: Portions of this document were prepared using Dragon voice recognition software and although reviewed may contain unintentional dictation errors in syntax, grammar, or spelling.     PATIENT EDUCATION: Education details: POC Person educated: Patient Education method: Explanation Education comprehension: verbalized understanding  HOME EXERCISE PROGRAM: Access Code: [email or web address] URL: [email or web address] Date: 03/17/2023 Prepared by: Thresa Ross  Exercises - Standing Single Leg Stance with Counter Support  - 1 x daily - 7 x weekly - 2 sets - 30 second  hold - Tandem Stance  - 1 x daily - 7 x weekly - 2 sets - 30 second  hold - Forward Step Touch  - 1 x daily - 7 x weekly - 2 sets - 10 reps -  Standing March with Counter Support  - 1 x daily - 7 x weekly - 2 sets - 10 reps  GOALS: Goals reviewed with patient? Yes  SHORT TERM GOALS: Target date: 04/07/2023     Patient will be independent in home exercise program to improve strength/mobility for better functional independence with ADLs. Baseline: No HEP currently  Goal status: INITIAL   LONG TERM GOALS: Target date: 05/05/2023      1.  Patient will increase FOTO score to equal to or greater than  77   to demonstrate statistically significant improvement in mobility and quality of life.  Baseline: 67 Goal status: INITIAL   2.  Patient will increase Berg Balance score by > 6 points to demonstrate decreased fall risk during functional activities. Baseline: 43 Goal status: INITIAL   3.   Patient will improve DGI by 4 points to reduce fall risk and demonstrate improved transfer/gait ability. Baseline: 17 Goal status: INITIAL  4.   Patient will increase six minute walk test distance to >1000 for progression to community ambulator and improve gait ability Baseline: visit 2 test 03/17/23: 850 ft  Goal status: INITIAL    ASSESSMENT:  CLINICAL IMPRESSION:  Patient presents to physical therapy with excellent motivation for completion of physical therapy activities. Patient progresses with lower extremity endurance as well as proprioceptive training to improve patient's gait and balance. Exercises given today seemed to appropriately challenge patient with components working towards increasing  advanced dynamic balance. Gait training worked on with ankle weights and cues for foot clearance. Pt will continue to benefit from skilled physical therapy intervention to address impairments, improve QOL, and attain therapy goals.    OBJECTIVE IMPAIRMENTS: Abnormal gait, decreased activity tolerance, decreased balance, and difficulty walking.   ACTIVITY LIMITATIONS: lifting and locomotion level  PARTICIPATION LIMITATIONS: community  activity, occupation, and yard work  PERSONAL FACTORS: Age are also affecting patient's functional outcome.   REHAB POTENTIAL: Excellent  CLINICAL  DECISION MAKING: Stable/uncomplicated  EVALUATION COMPLEXITY: Low  PLAN:  PT FREQUENCY: 1-2x/week  PT DURATION: 8 weeks  PLANNED INTERVENTIONS: Therapeutic exercises, Therapeutic activity, Neuromuscular re-education, Balance training, Gait training, Patient/Family education, Self Care, Joint mobilization, Stair training, Dry Needling, Manual therapy, and Re-evaluation  PLAN FOR NEXT SESSION: Continue plan of care, focus on right lower extremity foot clearance as well as dynamic balance challenges.  Cecile Sheerer, SPT  04/14/2023, 1:34 PM  This entire session was performed under direct supervision and direction of a licensed therapist/therapist assistant . I have personally read, edited and approve of the note as written.    This licensed clinician was present and actively directing care throughout the session at all times.  Norman Herrlich PT ,DPT Physical Therapist- [place]  [facility]

## 2023-04-20 ENCOUNTER — Encounter (INDEPENDENT_AMBULATORY_CARE_PROVIDER_SITE_OTHER): Payer: Self-pay | Admitting: Vascular Surgery

## 2023-04-20 ENCOUNTER — Ambulatory Visit (INDEPENDENT_AMBULATORY_CARE_PROVIDER_SITE_OTHER): Payer: Medicare Other | Admitting: Vascular Surgery

## 2023-04-20 VITALS — BP 110/69 | HR 54 | Resp 16 | Wt 205.2 lb

## 2023-04-20 DIAGNOSIS — I25118 Atherosclerotic heart disease of native coronary artery with other forms of angina pectoris: Secondary | ICD-10-CM

## 2023-04-20 DIAGNOSIS — I6523 Occlusion and stenosis of bilateral carotid arteries: Secondary | ICD-10-CM | POA: Diagnosis not present

## 2023-04-20 DIAGNOSIS — E1169 Type 2 diabetes mellitus with other specified complication: Secondary | ICD-10-CM

## 2023-04-20 DIAGNOSIS — E1159 Type 2 diabetes mellitus with other circulatory complications: Secondary | ICD-10-CM

## 2023-04-20 DIAGNOSIS — I152 Hypertension secondary to endocrine disorders: Secondary | ICD-10-CM

## 2023-04-20 NOTE — Progress Notes (Unsigned)
Patient ID: Shannon Chung, male   DOB: [birth year], 82 y.o.   MRN: [id number]  Chief Complaint  Patient presents with   New Patient (Initial Visit)    Ref [name] consult 70% carotid stenosis on recent CTA    HPI [name] is a 82 y.o. male.  I am asked to see the patient by Dr. [name] for evaluation of carotid stenosis. ***.     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 06/2015 Cardiac CT: Ca score 1103 (84th %'ile);  b. 07/2015 Cath: LM 70, LAD 80p, 100/73m, D1 70, D2 95, RI 75, RCA 100p/m;  c. 07/2015 CABG x 5 (LIMA->LAD, VG->Diag, VG->OM1->OM2, VG->OM3).   COVID-19    12/2021   COVID-19 01/18/2022   COVID-19 vaccine administered 01/18/2022   Unknown how many vaccine doses have been received. Entered from Emergency Triage Note.   Diabetes mellitus without complication (HCC) 07/2015   Dyslipidemia    Essential hypertension    Essential hypertension 01/02/2015   Formatting of this note might be different from the original.  Last Assessment & Plan:   Chronic, stable. Continue current regimen.   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 dilated, EF 35% improved to 45-50% (2015);  b. 07/2015 EF 25-35% by LV gram.   Ischemic cardiomyopathy 01/02/2015   Kidney stones 04/17/2021   Lone atrial fibrillation (HCC) 1983   a. isolated episode, not on OAC.   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 06/2014: LV; resolved with coumadin-->no residual on f/u echo, no longer on coumadin.   Mural thrombus of heart 08/26/2021   Formatting of this note might be different from the original. Jun 16, 2019 Entered By: Shannon Chung Comment: left ventricle, cardiac apex   Osteoarthritis    a. 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 Jun 16, 2019 Entered By: Shannon Chung Comment: leftAug 19, 2020 Entered By: Shannon Chung Comment: resolved by total left knee replacement Jun 16, 2019 Entered By: Shannon Chung Comment: leftAug 19, 2020  Entered By: Shannon Chung Comment: resolved by total left knee replacement   Trigger finger of left hand 07/07/2019   Vitamin D deficiency     Past Surgical History:  Procedure Laterality Date   BICEPS TENDON REPAIR Right 1993   CARDIAC CATHETERIZATION N/A 07/05/2015   Procedure: Left Heart Cath and Coronary Angiography;  Surgeon: Antonieta Iba, MD;  Location: [facility] INVASIVE CV LAB;  Service: Cardiovascular;  Laterality: N/A;   CATARACT EXTRACTION Left 12/2016   with keratoplasty   COLONOSCOPY  2007   COLONOSCOPY WITH PROPOFOL N/A 12/02/2017   TA, SSA, rpt 3 yrs(Tahiliani, Varnita B, MD)   COLONOSCOPY WITH PROPOFOL N/A 11/26/2020   Procedure: COLONOSCOPY WITH PROPOFOL;  Surgeon: Midge Minium, MD;  Location: [facility] ENDOSCOPY;  Service: Endoscopy;  Laterality: N/A;   CORONARY ARTERY BYPASS GRAFT N/A 07/29/2015   Procedure: CORONARY ARTERY BYPASS GRAFTING (CABG) x 5 (LIMA to LAD, SVG to DIAGONAL,  SVG SEQUENTIALLY to OM1 and OM2, SVG to OM3) with Endoscopic Vein Havesting of  GREATER SAPHENOUS VEIN from RIGHT THIGH and partial LOWER LEG ;  Surgeon: Alleen Borne, MD;  Location: [facility] OR;  Service: Open Heart Surgery;  Laterality: N/A;   EYE SURGERY     b/l cataract and cornea replaced    HAND SURGERY     left hand 1st/2nd trigger fingers Dr. Hyacinth Meeker ortho    JOINT REPLACEMENT     KNEE ARTHROSCOPY Left remote   MOHS SURGERY     left cheek scc 2022 Dr. Jeannine Boga   MOHS SURGERY     x 5 facial scc   right biceps tendon     repair/re attachment    SKIN CANCER EXCISION  10/2015   BCC - L ala (pending MOHs) and L scapula (complete excision)   TEE WITHOUT CARDIOVERSION N/A 07/29/2015   Procedure: TRANSESOPHAGEAL ECHOCARDIOGRAM (TEE);  Surgeon: Alleen Borne, MD;  Location: [facility] OR;  Service: Open Heart Surgery;  Laterality: N/A;   TONSILLECTOMY  1949   TOTAL KNEE ARTHROPLASTY Left 03/18/2016   cemented L TKR; Deeann Saint, MD     Family History  Problem Relation Age of Onset   CAD Father 60        MI   Hypertension Father    Hyperlipidemia Father    Alcoholism Father    Diabetes Father    Cancer Daughter        dx'ed 75 retroperitoneal liposarcoma       Social History   Tobacco Use   Smoking status: Former    Packs/day: 0.50    Years: 10.00    Additional pack years: 0.00    Total pack years: 5.00    Types: Cigarettes    Quit date: 11/02/1978    Years since quitting: 44.4   Smokeless tobacco: Never   Tobacco comments:  former smoker [phone] 1 pk/week no FH lung cancer   Vaping Use   Vaping Use: Never used  Substance Use Topics   Alcohol use: Yes    Alcohol/week: 0.0 standard drinks of alcohol    Comment: beer/wine on weekends   Drug use: No     Allergies  Allergen Reactions   Bee Pollen Itching   Pollen Extract Itching    Current Outpatient Medications  Medication Sig Dispense Refill   acetaminophen (TYLENOL) 500 MG tablet Take 500 mg by mouth every 6 (six) hours as needed for mild pain.      aspirin 81 MG EC tablet Take 162 mg by mouth daily. Swallow whole.     atorvastatin (LIPITOR) 40 MG tablet Take 1 tablet (40 mg total) by mouth daily.     Cholecalciferol (VITAMIN D-3) 125 MCG (5000 UT) TABS Take 5,000 Units by mouth daily.     clopidogrel (PLAVIX) 75 MG tablet Take 1 tablet (75 mg total) by mouth daily. 30 tablet 0   ezetimibe (ZETIA) 10 MG tablet Take 1 tablet (10 mg total) by mouth daily. 90 tablet 3   famotidine (PEPCID) 20 MG tablet TAKE 1 TABLET BY MOUTH 2 TIMES DAILY AS NEEDED FOR HEARTBURN/INDIGESTION. D/C ZANTAC 180 tablet 3   fluticasone (FLONASE) 50 MCG/ACT nasal spray Place 1 spray into both nostrils daily as needed for allergies.     lansoprazole (PREVACID) 30 MG capsule Take 1 capsule (30 mg total) by mouth every morning. 90 capsule 3   lisinopril (ZESTRIL) 10 MG tablet TAKE 1 TABLET BY MOUTH  DAILY 90 tablet 3   metFORMIN (GLUCOPHAGE) 500 MG tablet Take 1 tablet (500 mg total) by mouth 2 (two) times daily with a meal. (Patient  taking differently: Take 1,000 mg by mouth daily with supper.) 180 tablet 3   metoprolol succinate (TOPROL-XL) 25 MG 24 hr tablet TAKE 1 TABLET BY MOUTH ONCE  DAILY 90 tablet 2   prednisoLONE acetate (PRED FORTE) 1 % ophthalmic suspension Place 1 drop into both eyes daily.     sildenafil (REVATIO) 20 MG tablet Take 1 tablet (20 mg total) by mouth daily as needed. 30 tablet 0   vitamin B-12 (CYANOCOBALAMIN) 500 MCG tablet Take 500 mcg by mouth daily.     No current facility-administered medications for this visit.      REVIEW OF SYSTEMS (Negative unless checked)  Constitutional: [] Weight loss  [] Fever  [] Chills Cardiac: [] Chest pain   [] Chest pressure   [] Palpitations   [] Shortness of breath when laying flat   [] Shortness of breath at rest   [] Shortness of breath with exertion. Vascular:  [] Pain in legs with walking   [] Pain in legs at rest   [] Pain in legs when laying flat   [] Claudication   [] Pain in feet when walking  [] Pain in feet at rest  [] Pain in feet when laying flat   [] History of DVT   [] Phlebitis   [] Swelling in legs   [] Varicose veins   [] Non-healing ulcers Pulmonary:   [] Uses home oxygen   [] Productive cough   [] Hemoptysis   [] Wheeze  [] COPD   [] Asthma Neurologic:  [] Dizziness  [] Blackouts   [] Seizures   [x] History of stroke   [x] History of TIA  [] Aphasia   [] Temporary blindness   [] Dysphagia   [] Weakness or numbness in arms   [] Weakness or numbness in legs Musculoskeletal:  [x] Arthritis   [] Joint swelling   [] Joint pain   [] Low back pain Hematologic:  [] Easy bruising  [] Easy bleeding   []   Hypercoagulable state   [] Anemic  [] Hepatitis Gastrointestinal:  [] Blood in stool   [] Vomiting blood  [x] Gastroesophageal reflux/heartburn   [] Abdominal pain Genitourinary:  [] Chronic kidney disease   [] Difficult urination  [] Frequent urination  [] Burning with urination   [] Hematuria Skin:  [] Rashes   [] Ulcers   [] Wounds Psychological:  [x] History of anxiety   [x]  History of major  depression.    Physical Exam BP 110/69 (BP Location: Right Arm)   Pulse (!) 54   Resp 16   Wt 205 lb 3.2 oz (93.1 kg)   BMI 27.83 kg/m  Gen:  WD/WN, NAD. Appears younger than stated age. Head: [place]/AT, No temporalis wasting.  Ear/Nose/Throat: Hearing grossly intact, nares w/o erythema or drainage, oropharynx w/o Erythema/Exudate Eyes: Conjunctiva clear, sclera non-icteric  Neck: trachea midline.  No JVD.  Pulmonary:  Good air movement, respirations not labored, no use of accessory muscles  Cardiac: irregular Vascular:  Vessel Right Left  Radial Palpable Palpable                                   Gastrointestinal:. No masses, surgical incisions, or scars. Musculoskeletal: M/S 5/5 throughout.  Extremities without ischemic changes.  No deformity or atrophy. Trace LE edema. Neurologic: Sensation grossly intact in extremities.  Symmetrical.  Speech is fluent. Motor exam as listed above. Psychiatric: Judgment intact, Mood & affect appropriate for pt's clinical situation. Dermatologic: No rashes or ulcers noted.  No cellulitis or open wounds.    Radiology No results found.  Labs Recent Results (from the past 2160 hour(s))  HM DIABETES EYE EXAM     Status: None   Collection Time: 02/03/23 12:00 AM  Result Value Ref Range   HM Diabetic Eye Exam No Retinopathy No Retinopathy    Comment: Abstracted by HIM  CBG monitoring, ED     Status: None   Collection Time: 02/22/23  1:20 PM  Result Value Ref Range   Glucose-Capillary 96 70 - 99 mg/dL    Comment: Glucose reference range applies only to samples taken after fasting for at least 8 hours.  MRSA Next Gen by PCR, Nasal     Status: None   Collection Time: 02/22/23  1:24 PM   Specimen: Nasal Mucosa; Nasal Swab  Result Value Ref Range   MRSA by PCR Next Gen NOT DETECTED NOT DETECTED    Comment: (NOTE) The GeneXpert MRSA Assay (FDA approved for NASAL specimens only), is one component of a comprehensive MRSA colonization  surveillance program. It is not intended to diagnose MRSA infection nor to guide or monitor treatment for MRSA infections. Test performance is not FDA approved in patients less than 74 years old. Performed at [facility], [address] [place], [place] [address]   CBC     Status: None   Collection Time: 02/22/23  1:40 PM  Result Value Ref Range   WBC 7.8 4.0 - 10.5 K/uL   RBC 4.26 4.22 - 5.81 MIL/uL   Hemoglobin 13.9 13.0 - 17.0 g/dL   HCT 60.4 54.0 - 98.1 %   MCV 98.4 80.0 - 100.0 fL   MCH 32.6 26.0 - 34.0 pg   MCHC 33.2 30.0 - 36.0 g/dL   RDW 19.1 47.8 - 29.5 %   Platelets 163 150 - 400 K/uL   nRBC 0.0 0.0 - 0.2 %    Comment: Performed at [facility], [address], [place], [place] [address]  Differential  Status: None   Collection Time: 02/22/23  1:40 PM  Result Value Ref Range   Neutrophils Relative % 51 %   Neutro Abs 4.0 1.7 - 7.7 K/uL   Lymphocytes Relative 39 %   Lymphs Abs 3.1 0.7 - 4.0 K/uL   Monocytes Relative 7 %   Monocytes Absolute 0.5 0.1 - 1.0 K/uL   Eosinophils Relative 2 %   Eosinophils Absolute 0.1 0.0 - 0.5 K/uL   Basophils Relative 1 %   Basophils Absolute 0.1 0.0 - 0.1 K/uL   Immature Granulocytes 0 %   Abs Immature Granulocytes 0.02 0.00 - 0.07 K/uL    Comment: Performed at [facility], [address] [place], [place] [address]  Comprehensive metabolic panel     Status: Abnormal   Collection Time: 02/22/23  1:40 PM  Result Value Ref Range   Sodium 137 135 - 145 mmol/L   Potassium 4.0 3.5 - 5.1 mmol/L   Chloride 105 98 - 111 mmol/L   CO2 23 22 - 32 mmol/L   Glucose, Bld 103 (H) 70 - 99 mg/dL    Comment: Glucose reference range applies only to samples taken after fasting for at least 8 hours.   BUN 17 8 - 23 mg/dL   Creatinine, Ser 8.65 0.61 - 1.24 mg/dL   Calcium 9.1 8.9 - 78.4 mg/dL   Total Protein 7.2 6.5 - 8.1 g/dL   Albumin 4.2 3.5 - 5.0 g/dL   AST 24 15 - 41 U/L   ALT 12 0 - 44 U/L   Alkaline  Phosphatase 47 38 - 126 U/L   Total Bilirubin 0.6 0.3 - 1.2 mg/dL   GFR, Estimated >69 >62 mL/min    Comment: (NOTE) Calculated using the CKD-EPI Creatinine Equation (2021)    Anion gap 9 5 - 15    Comment: Performed at [facility], [address] [place], [place] [address]  Hemoglobin A1c     Status: Abnormal   Collection Time: 02/22/23  1:40 PM  Result Value Ref Range   Hgb A1c MFr Bld 6.4 (H) 4.8 - 5.6 %    Comment: (NOTE) Pre diabetes:          5.7%-6.4%  Diabetes:              >6.4%  Glycemic control for   <7.0% adults with diabetes    Mean Plasma Glucose 136.98 mg/dL    Comment: Performed at [facility], [address], [place], [place] [address]  Protime-INR     Status: None   Collection Time: 02/22/23  2:14 PM  Result Value Ref Range   Prothrombin Time 13.8 11.4 - 15.2 seconds   INR 1.1 0.8 - 1.2    Comment: (NOTE) INR goal varies based on device and disease states. Performed at [facility], [address] [place], [place] [address]   APTT     Status: None   Collection Time: 02/22/23  2:14 PM  Result Value Ref Range   aPTT 26 24 - 36 seconds    Comment: Performed at [facility], [address] [place], [place] [address]  Ethanol     Status: None   Collection Time: 02/22/23  2:14 PM  Result Value Ref Range   Alcohol, Ethyl (B) <10 <10 mg/dL    Comment: (NOTE) Lowest detectable limit for serum alcohol is 10 mg/dL.  For medical purposes only. Performed at [facility], [address], [place], [place] [address]  Troponin I (High Sensitivity)     Status: None   Collection Time: 02/22/23  3:51 PM  Result Value Ref Range   Troponin I (High Sensitivity) 6 <18 ng/L    Comment: (NOTE) Elevated high sensitivity troponin I (hsTnI) values and significant  changes across serial measurements may suggest ACS but many other  chronic and acute conditions are known to elevate hsTnI results.  Refer to the "Links"  section for chest pain algorithms and additional  guidance. Performed at [facility], [address] [place], [place] [address]   Glucose, capillary     Status: Abnormal   Collection Time: 02/22/23  5:08 PM  Result Value Ref Range   Glucose-Capillary 123 (H) 70 - 99 mg/dL    Comment: Glucose reference range applies only to samples taken after fasting for at least 8 hours.   Comment 1 Notify RN   Glucose, capillary     Status: Abnormal   Collection Time: 02/22/23  8:07 PM  Result Value Ref Range   Glucose-Capillary 124 (H) 70 - 99 mg/dL    Comment: Glucose reference range applies only to samples taken after fasting for at least 8 hours.  Lipid panel     Status: None   Collection Time: 02/23/23  4:14 AM  Result Value Ref Range   Cholesterol 139 0 - 200 mg/dL   Triglycerides 604 <540 mg/dL   HDL 46 >98 mg/dL   Total CHOL/HDL Ratio 3.0 RATIO   VLDL 23 0 - 40 mg/dL   LDL Cholesterol 70 0 - 99 mg/dL    Comment:        Total Cholesterol/HDL:CHD Risk Coronary Heart Disease Risk Table                     Men   Women  1/2 Average Risk   3.4   3.3  Average Risk       5.0   4.4  2 X Average Risk   9.6   7.1  3 X Average Risk  23.4   11.0        Use the calculated Patient Ratio above and the CHD Risk Table to determine the patient's CHD Risk.        ATP III CLASSIFICATION (LDL):  <100     mg/dL   Optimal  119-147  mg/dL   Near or Above                    Optimal  130-159  mg/dL   Borderline  829-562  mg/dL   High  >130     mg/dL   Very High Performed at [facility], [address] [place], [place] [address]   Glucose, capillary     Status: Abnormal   Collection Time: 02/23/23  7:48 AM  Result Value Ref Range   Glucose-Capillary 113 (H) 70 - 99 mg/dL    Comment: Glucose reference range applies only to samples taken after fasting for at least 8 hours.  Urine Drug Screen, Qualitative ([facility] only)     Status: None   Collection Time: 02/23/23 10:57 AM   Result Value Ref Range   Tricyclic, Ur Screen TEST UNAVAILABLE NONE DETECTED   Amphetamines, Ur Screen NONE DETECTED NONE DETECTED   MDMA (Ecstasy)Ur Screen NONE DETECTED NONE DETECTED   Cocaine Metabolite,Ur [place] NONE DETECTED NONE DETECTED   Opiate, Ur Screen NONE DETECTED NONE DETECTED   Phencyclidine (PCP) Ur S NONE DETECTED NONE DETECTED  Cannabinoid 50 Ng, Ur [place] NONE DETECTED NONE DETECTED   Barbiturates, Ur Screen NONE DETECTED NONE DETECTED   Benzodiazepine, Ur Scrn NONE DETECTED NONE DETECTED   Methadone Scn, Ur NONE DETECTED NONE DETECTED    Comment: (NOTE) Tricyclics + metabolites, urine    Cutoff 1000 ng/mL Amphetamines + metabolites, urine  Cutoff 1000 ng/mL MDMA (Ecstasy), urine              Cutoff 500 ng/mL Cocaine Metabolite, urine          Cutoff 300 ng/mL Opiate + metabolites, urine        Cutoff 300 ng/mL Phencyclidine (PCP), urine         Cutoff 25 ng/mL Cannabinoid, urine                 Cutoff 50 ng/mL Barbiturates + metabolites, urine  Cutoff 200 ng/mL Benzodiazepine, urine              Cutoff 200 ng/mL Methadone, urine                   Cutoff 300 ng/mL  The urine drug screen provides only a preliminary, unconfirmed analytical test result and should not be used for non-medical purposes. Clinical consideration and professional judgment should be applied to any positive drug screen result due to possible interfering substances. A more specific alternate chemical method must be used in order to obtain a confirmed analytical result. Gas chromatography / mass spectrometry (GC/MS) is the preferred confirm atory method. Performed at [facility], [address] [place], [place] [address]   Glucose, capillary     Status: Abnormal   Collection Time: 02/23/23 11:41 AM  Result Value Ref Range   Glucose-Capillary 101 (H) 70 - 99 mg/dL    Comment: Glucose reference range applies only to samples taken after fasting for at least 8 hours.  ECHOCARDIOGRAM  COMPLETE     Status: None   Collection Time: 02/23/23  1:40 PM  Result Value Ref Range   Weight 3,361.6 oz   Height 72 in   BP 141/91 mmHg   Ao pk vel 0.87 m/s   AV Area VTI 3.44 cm2   AR max vel 2.90 cm2   AV Mean grad 2.0 mmHg   AV Peak grad 3.0 mmHg   S' Lateral 3.20 cm   AV Area mean vel 2.67 cm2   Area-P 1/2 3.11 cm2   MV VTI 2.07 cm2   Est EF >55   Glucose, capillary     Status: Abnormal   Collection Time: 02/23/23  4:26 PM  Result Value Ref Range   Glucose-Capillary 109 (H) 70 - 99 mg/dL    Comment: Glucose reference range applies only to samples taken after fasting for at least 8 hours.  CBC     Status: None   Collection Time: 03/04/23  8:43 AM  Result Value Ref Range   WBC 6.8 4.0 - 10.5 K/uL   RBC 4.40 4.22 - 5.81 Mil/uL   Platelets 185.0 150.0 - 400.0 K/uL   Hemoglobin 14.4 13.0 - 17.0 g/dL   HCT 60.4 54.0 - 98.1 %   MCV 97.9 78.0 - 100.0 fl   MCHC 33.5 30.0 - 36.0 g/dL   RDW 19.1 47.8 - 29.5 %  Basic Metabolic Panel (BMET)     Status: Abnormal   Collection Time: 03/04/23  8:43 AM  Result Value Ref Range   Sodium 140 135 - 145 mEq/L   Potassium 4.6 3.5 -  5.1 mEq/L   Chloride 102 96 - 112 mEq/L   CO2 28 19 - 32 mEq/L   Glucose, Bld 124 (H) 70 - 99 mg/dL   BUN 20 6 - 23 mg/dL   Creatinine, Ser 8.11 0.40 - 1.50 mg/dL   GFR 91.47 >82.95 mL/min    Comment: Calculated using the CKD-EPI Creatinine Equation (2021)   Calcium 9.8 8.4 - 10.5 mg/dL    Assessment/Plan:  No problem-specific Assessment & Plan notes found for this encounter.      Festus Barren 04/20/2023, 3:49 PM   This note was created with Dragon medical transcription system.  Any errors from dictation are unintentional.

## 2023-04-20 NOTE — Assessment & Plan Note (Signed)
blood glucose control important in reducing the progression of atherosclerotic disease. Also, involved in wound healing. On appropriate medications.  

## 2023-04-20 NOTE — Assessment & Plan Note (Signed)
blood pressure control important in reducing the progression of atherosclerotic disease. On appropriate oral medications.  

## 2023-04-20 NOTE — H&P (View-Only) (Signed)
Patient ID: Shannon Chung, male   DOB: 1941/08/08, 82 y.o.   MRN: [id number]  Chief Complaint  Patient presents with   New Patient (Initial Visit)    Ref [name] consult 70% carotid stenosis on recent CTA    HPI Shannon Chung is a 82 y.o. male.  I am asked to see the patient by Dr. [name] for evaluation of carotid stenosis.  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 He had right arm and leg weakness and numbness.  He was promptly treated with tpa which resolved his symptoms. He is still doing therapy but notices no residual symptoms.   I have independently reviewed the CT angiogram of his carotid arteries.  The official report is of a 70% right ICA stenosis and a 60% left ICA stenosis I believe both are somewhat worse than this.  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 06/2015 Cardiac CT: Ca score 1103 (84th %'ile);  b. 07/2015 Cath: LM 70, LAD 80p, 100/20m, D1 70, D2 95, RI 75, RCA 100p/m;  c. 07/2015 CABG x 5 (LIMA->LAD, VG->Diag, VG->OM1->OM2, VG->OM3).   COVID-19    12/2021   COVID-19 01/18/2022   COVID-19 vaccine administered 01/18/2022   Unknown how many vaccine doses have been received. Entered from Emergency Triage Note.   Diabetes mellitus without complication (HCC) 07/2015   Dyslipidemia    Essential hypertension    Essential hypertension 01/02/2015   Formatting of this note might be different from the original.   Last Assessment & Plan:   Chronic, stable. Continue current regimen.   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 dilated, EF 35% improved to 45-50% (2015);  b. 07/2015 EF 25-35% by LV gram.   Ischemic cardiomyopathy 01/02/2015   Kidney stones 04/17/2021   Lone atrial fibrillation (HCC) 1983   a. isolated episode, not on OAC.   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 06/2014: LV; resolved with coumadin-->no residual on f/u echo,  no longer on coumadin.   Mural thrombus of heart 08/26/2021   Formatting of this note might be different from the original. Jun 16, 2019 Entered By: [name] Comment: left ventricle, cardiac apex   Osteoarthritis    a. R-shoulder, L-knee [facility] orth[facility])   Personal history of colonic polyps    Polyp of colon    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   PSA elevation 03/25/2018   Retention cyst of paranasal sinus 03/11/2022   Shoulder pain 12/23/2022   Jun 21, 2019 Entered By: [name] Comment: attributed to arthritis   Skin cancer    squamous and basal cell right forearm, SCC left cheek 10/04/20 sees derm  regularly Dr. 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 Jun 16, 2019 Entered By: [name] Comment: leftAug 19, 2020 Entered By: [name] Comment: resolved by total left knee replacement Jun 16, 2019 Entered By: [name] Comment: leftAug 19, 2020 Entered By: [name] Comment: resolved by total left knee replacement   Trigger finger of left hand 07/07/2019   Vitamin D deficiency     Past Surgical History:  Procedure Laterality Date   BICEPS TENDON REPAIR Right 1993   CARDIAC CATHETERIZATION N/A 07/05/2015   Procedure: Left Heart Cath and Coronary Angiography;  Surgeon: Antonieta Iba, MD;  Location: [facility] INVASIVE CV LAB;  Service: Cardiovascular;  Laterality: N/A;   CATARACT EXTRACTION Left 12/2016   with keratoplasty   COLONOSCOPY  2007   COLONOSCOPY WITH PROPOFOL N/A 12/02/2017   TA, SSA, rpt 3 yrs(Tahiliani, Varnita B, MD)   COLONOSCOPY WITH PROPOFOL N/A 11/26/2020   Procedure: COLONOSCOPY WITH PROPOFOL;  Surgeon: Midge Minium, MD;  Location: [facility] ENDOSCOPY;  Service: Endoscopy;  Laterality: N/A;   CORONARY ARTERY BYPASS GRAFT N/A 07/29/2015   Procedure: CORONARY ARTERY BYPASS GRAFTING (CABG) x 5 (LIMA to LAD, SVG to DIAGONAL,  SVG SEQUENTIALLY to OM1 and OM2, SVG to OM3) with Endoscopic Vein Havesting of  GREATER SAPHENOUS VEIN from RIGHT THIGH and partial LOWER LEG ;  Surgeon: Alleen Borne, MD;  Location: [facility] OR;  Service: Open Heart Surgery;  Laterality: N/A;   EYE SURGERY     b/l cataract and cornea replaced    HAND SURGERY     left hand 1st/2nd trigger fingers Dr. Hyacinth Meeker ortho    JOINT REPLACEMENT     KNEE ARTHROSCOPY Left remote    MOHS SURGERY     left cheek scc 2022 Dr. Jeannine Boga   MOHS SURGERY     x 5 facial scc   right biceps tendon     repair/re attachment    SKIN CANCER EXCISION  10/2015   BCC - L ala (pending MOHs) and L scapula (complete excision)   TEE WITHOUT CARDIOVERSION N/A 07/29/2015   Procedure: TRANSESOPHAGEAL ECHOCARDIOGRAM (TEE);  Surgeon: Alleen Borne, MD;  Location: [facility] OR;  Service: Open Heart Surgery;  Laterality: N/A;   TONSILLECTOMY  1949   TOTAL KNEE ARTHROPLASTY Left 03/18/2016   cemented L TKR; Deeann Saint, MD     Family History  Problem Relation Age of Onset   CAD Father 94  MI   Hypertension Father    Hyperlipidemia Father    Alcoholism Father    Diabetes Father    Cancer Daughter        dx'ed 20 retroperitoneal liposarcoma       Social History   Tobacco Use   Smoking status: Former    Packs/day: 0.50    Years: 10.00    Additional pack years: 0.00    Total pack years: 5.00    Types: Cigarettes    Quit date: 11/02/1978    Years since quitting: 44.4   Smokeless tobacco: Never   Tobacco comments:    former smoker 1967-1980 1 pk/week no FH lung cancer   Vaping Use   Vaping Use: Never used  Substance Use Topics   Alcohol use: Yes    Alcohol/week: 0.0 standard drinks of alcohol    Comment: beer/wine on weekends   Drug use: No     Allergies  Allergen Reactions   Bee Pollen Itching   Pollen Extract Itching    Current Outpatient Medications  Medication Sig Dispense Refill   acetaminophen (TYLENOL) 500 MG tablet Take 500 mg by mouth every 6 (six) hours as needed for mild pain.      aspirin 81 MG EC tablet Take 162 mg by mouth daily. Swallow whole.     atorvastatin (LIPITOR) 40 MG tablet Take 1 tablet (40 mg total) by mouth daily.     Cholecalciferol (VITAMIN D-3) 125 MCG (5000 UT) TABS Take 5,000 Units by mouth daily.     clopidogrel (PLAVIX) 75 MG tablet Take 1 tablet (75 mg total) by mouth daily. 30 tablet 0   ezetimibe (ZETIA) 10 MG tablet Take 1 tablet  (10 mg total) by mouth daily. 90 tablet 3   famotidine (PEPCID) 20 MG tablet TAKE 1 TABLET BY MOUTH 2 TIMES DAILY AS NEEDED FOR HEARTBURN/INDIGESTION. D/C ZANTAC 180 tablet 3   fluticasone (FLONASE) 50 MCG/ACT nasal spray Place 1 spray into both nostrils daily as needed for allergies.     lansoprazole (PREVACID) 30 MG capsule Take 1 capsule (30 mg total) by mouth every morning. 90 capsule 3   lisinopril (ZESTRIL) 10 MG tablet TAKE 1 TABLET BY MOUTH  DAILY 90 tablet 3   metFORMIN (GLUCOPHAGE) 500 MG tablet Take 1 tablet (500 mg total) by mouth 2 (two) times daily with a meal. (Patient taking differently: Take 1,000 mg by mouth daily with supper.) 180 tablet 3   metoprolol succinate (TOPROL-XL) 25 MG 24 hr tablet TAKE 1 TABLET BY MOUTH ONCE  DAILY 90 tablet 2   prednisoLONE acetate (PRED FORTE) 1 % ophthalmic suspension Place 1 drop into both eyes daily.     sildenafil (REVATIO) 20 MG tablet Take 1 tablet (20 mg total) by mouth daily as needed. 30 tablet 0   vitamin B-12 (CYANOCOBALAMIN) 500 MCG tablet Take 500 mcg by mouth daily.     No current facility-administered medications for this visit.      REVIEW OF SYSTEMS (Negative unless checked)  Constitutional: [] Weight loss  [] Fever  [] Chills Cardiac: [] Chest pain   [] Chest pressure   [] Palpitations   [] Shortness of breath when laying flat   [] Shortness of breath at rest   [] Shortness of breath with exertion. Vascular:  [] Pain in legs with walking   [] Pain in legs at rest   [] Pain in legs when laying flat   [] Claudication   [] Pain in feet when walking  [] Pain in feet at rest  [] Pain in feet when laying  flat   [] History of DVT   [] Phlebitis   [] Swelling in legs   [] Varicose veins   [] Non-healing ulcers Pulmonary:   [] Uses home oxygen   [] Productive cough   [] Hemoptysis   [] Wheeze  [] COPD   [] Asthma Neurologic:  [] Dizziness  [] Blackouts   [] Seizures   [x] History of stroke   [x] History of TIA  [] Aphasia   [] Temporary blindness   [] Dysphagia    [] Weakness or numbness in arms   [] Weakness or numbness in legs Musculoskeletal:  [x] Arthritis   [] Joint swelling   [] Joint pain   [] Low back pain Hematologic:  [] Easy bruising  [] Easy bleeding   [] Hypercoagulable state   [] Anemic  [] Hepatitis Gastrointestinal:  [] Blood in stool   [] Vomiting blood  [x] Gastroesophageal reflux/heartburn   [] Abdominal pain Genitourinary:  [] Chronic kidney disease   [] Difficult urination  [] Frequent urination  [] Burning with urination   [] Hematuria Skin:  [] Rashes   [] Ulcers   [] Wounds Psychological:  [x] History of anxiety   [x]  History of major depression.    Physical Exam BP 110/69 (BP Location: Right Arm)   Pulse (!) 54   Resp 16   Wt 205 lb 3.2 oz (93.1 kg)   BMI 27.83 kg/m  Gen:  WD/WN, NAD. Appears younger than stated age. Head: [place]/AT, No temporalis wasting.  Ear/Nose/Throat: Hearing grossly intact, nares w/o erythema or drainage, oropharynx w/o Erythema/Exudate Eyes: Conjunctiva clear, sclera non-icteric  Neck: trachea midline.  No JVD.  Pulmonary:  Good air movement, respirations not labored, no use of accessory muscles  Cardiac: irregular, bradycardic Vascular:  Vessel Right Left  Radial Palpable Palpable                                   Gastrointestinal:. No masses, surgical incisions, or scars. Musculoskeletal: M/S 5/5 throughout.  Extremities without ischemic changes.  No deformity or atrophy. Trace LE edema. Neurologic: Sensation grossly intact in extremities.  Symmetrical.  Speech is fluent. Motor exam as listed above. Psychiatric: Judgment intact, Mood & affect appropriate for pt's clinical situation. Dermatologic: No rashes or ulcers noted.  No cellulitis or open wounds.    Radiology No results found.  Labs Recent Results (from the past 2160 hour(s))  HM DIABETES EYE EXAM     Status: None   Collection Time: 02/03/23 12:00 AM  Result Value Ref Range   HM Diabetic Eye Exam No Retinopathy No Retinopathy    Comment:  Abstracted by HIM  CBG monitoring, ED     Status: None   Collection Time: 02/22/23  1:20 PM  Result Value Ref Range   Glucose-Capillary 96 70 - 99 mg/dL    Comment: Glucose reference range applies only to samples taken after fasting for at least 8 hours.  MRSA Next Gen by PCR, Nasal     Status: None   Collection Time: 02/22/23  1:24 PM   Specimen: Nasal Mucosa; Nasal Swab  Result Value Ref Range   MRSA by PCR Next Gen NOT DETECTED NOT DETECTED    Comment: (NOTE) The GeneXpert MRSA Assay (FDA approved for NASAL specimens only), is one component of a comprehensive MRSA colonization surveillance program. It is not intended to diagnose MRSA infection nor to guide or monitor treatment for MRSA infections. Test performance is not FDA approved in patients less than 30 years old. Performed at [facility], [address], [place], [place] [address]   CBC     Status: None  Collection Time: 02/22/23  1:40 PM  Result Value Ref Range   WBC 7.8 4.0 - 10.5 K/uL   RBC 4.26 4.22 - 5.81 MIL/uL   Hemoglobin 13.9 13.0 - 17.0 g/dL   HCT 16.1 09.6 - 04.5 %   MCV 98.4 80.0 - 100.0 fL   MCH 32.6 26.0 - 34.0 pg   MCHC 33.2 30.0 - 36.0 g/dL   RDW 40.9 81.1 - 91.4 %   Platelets 163 150 - 400 K/uL   nRBC 0.0 0.0 - 0.2 %    Comment: Performed at [facility], [address] [place], [place] [address]  Differential     Status: None   Collection Time: 02/22/23  1:40 PM  Result Value Ref Range   Neutrophils Relative % 51 %   Neutro Abs 4.0 1.7 - 7.7 K/uL   Lymphocytes Relative 39 %   Lymphs Abs 3.1 0.7 - 4.0 K/uL   Monocytes Relative 7 %   Monocytes Absolute 0.5 0.1 - 1.0 K/uL   Eosinophils Relative 2 %   Eosinophils Absolute 0.1 0.0 - 0.5 K/uL   Basophils Relative 1 %   Basophils Absolute 0.1 0.0 - 0.1 K/uL   Immature Granulocytes 0 %   Abs Immature Granulocytes 0.02 0.00 - 0.07 K/uL    Comment: Performed at [facility], [address] [place], [place] [address]   Comprehensive metabolic panel     Status: Abnormal   Collection Time: 02/22/23  1:40 PM  Result Value Ref Range   Sodium 137 135 - 145 mmol/L   Potassium 4.0 3.5 - 5.1 mmol/L   Chloride 105 98 - 111 mmol/L   CO2 23 22 - 32 mmol/L   Glucose, Bld 103 (H) 70 - 99 mg/dL    Comment: Glucose reference range applies only to samples taken after fasting for at least 8 hours.   BUN 17 8 - 23 mg/dL   Creatinine, Ser 8.65 0.61 - 1.24 mg/dL   Calcium 9.1 8.9 - 78.4 mg/dL   Total Protein 7.2 6.5 - 8.1 g/dL   Albumin 4.2 3.5 - 5.0 g/dL   AST 24 15 - 41 U/L   ALT 12 0 - 44 U/L   Alkaline Phosphatase 47 38 - 126 U/L   Total Bilirubin 0.6 0.3 - 1.2 mg/dL   GFR, Estimated >69 >62 mL/min    Comment: (NOTE) Calculated using the CKD-EPI Creatinine Equation (2021)    Anion gap 9 5 - 15    Comment: Performed at [facility], [address] [place], [place] [address]  Hemoglobin A1c     Status: Abnormal   Collection Time: 02/22/23  1:40 PM  Result Value Ref Range   Hgb A1c MFr Bld 6.4 (H) 4.8 - 5.6 %    Comment: (NOTE) Pre diabetes:          5.7%-6.4%  Diabetes:              >6.4%  Glycemic control for   <7.0% adults with diabetes    Mean Plasma Glucose 136.98 mg/dL    Comment: Performed at [facility], [address], [place], [place] [address]  Protime-INR     Status: None   Collection Time: 02/22/23  2:14 PM  Result Value Ref Range   Prothrombin Time 13.8 11.4 - 15.2 seconds   INR 1.1 0.8 - 1.2    Comment: (NOTE) INR goal varies based on device and disease states. Performed at [facility], [phone]  [address] [place], [place] [address]   APTT     Status: None   Collection Time: 02/22/23  2:14 PM  Result Value Ref Range   aPTT 26 24 - 36 seconds    Comment: Performed at [facility], [address] [place], [place] [address]  Ethanol     Status: None   Collection Time: 02/22/23  2:14 PM  Result Value Ref Range   Alcohol, Ethyl (B) <10 <10 mg/dL     Comment: (NOTE) Lowest detectable limit for serum alcohol is 10 mg/dL.  For medical purposes only. Performed at [facility], [address] [place], [place] [address]   Troponin I (High Sensitivity)     Status: None   Collection Time: 02/22/23  3:51 PM  Result Value Ref Range   Troponin I (High Sensitivity) 6 <18 ng/L    Comment: (NOTE) Elevated high sensitivity troponin I (hsTnI) values and significant  changes across serial measurements may suggest ACS but many other  chronic and acute conditions are known to elevate hsTnI results.  Refer to the "Links" section for chest pain algorithms and additional  guidance. Performed at [facility], [address] [place], [place] [address]   Glucose, capillary     Status: Abnormal   Collection Time: 02/22/23  5:08 PM  Result Value Ref Range   Glucose-Capillary 123 (H) 70 - 99 mg/dL    Comment: Glucose reference range applies only to samples taken after fasting for at least 8 hours.   Comment 1 Notify RN   Glucose, capillary     Status: Abnormal   Collection Time: 02/22/23  8:07 PM  Result Value Ref Range   Glucose-Capillary 124 (H) 70 - 99 mg/dL    Comment: Glucose reference range applies only to samples taken after fasting for at least 8 hours.  Lipid panel     Status: None   Collection Time: 02/23/23  4:14 AM  Result Value Ref Range   Cholesterol 139 0 - 200 mg/dL   Triglycerides 130 <865 mg/dL   HDL 46 >78 mg/dL   Total CHOL/HDL Ratio 3.0 RATIO   VLDL 23 0 - 40 mg/dL   LDL Cholesterol 70 0 - 99 mg/dL    Comment:        Total Cholesterol/HDL:CHD Risk Coronary Heart Disease Risk Table                     Men   Women  1/2 Average Risk   3.4   3.3  Average Risk       5.0   4.4  2 X Average Risk   9.6   7.1  3 X Average Risk  23.4   11.0        Use the calculated Patient Ratio above and the CHD Risk Table to determine the patient's CHD Risk.        ATP III CLASSIFICATION (LDL):  <100     mg/dL    Optimal  469-629  mg/dL   Near or Above                    Optimal  130-159  mg/dL   Borderline  528-413  mg/dL   High  >244     mg/dL   Very High Performed at [facility], [address] [place], [place] [address]   Glucose, capillary     Status: Abnormal   Collection Time: 02/23/23  7:48 AM  Result Value Ref Range   Glucose-Capillary 113 (H) 70 - 99 mg/dL    Comment: Glucose reference range applies only to samples taken after fasting for at least 8 hours.  Urine Drug Screen, Qualitative ([facility] only)     Status: None   Collection Time: 02/23/23 10:57 AM  Result Value Ref Range   Tricyclic, Ur Screen TEST UNAVAILABLE NONE DETECTED   Amphetamines, Ur Screen NONE DETECTED NONE DETECTED   MDMA (Ecstasy)Ur Screen NONE DETECTED NONE DETECTED   Cocaine Metabolite,Ur [place] NONE DETECTED NONE DETECTED   Opiate, Ur Screen NONE DETECTED NONE DETECTED   Phencyclidine (PCP) Ur S NONE DETECTED NONE DETECTED   Cannabinoid 50 Ng, Ur [place] NONE DETECTED NONE DETECTED   Barbiturates, Ur Screen NONE DETECTED NONE DETECTED   Benzodiazepine, Ur Scrn NONE DETECTED NONE DETECTED   Methadone Scn, Ur NONE DETECTED NONE DETECTED    Comment: (NOTE) Tricyclics + metabolites, urine    Cutoff 1000 ng/mL Amphetamines + metabolites, urine  Cutoff 1000 ng/mL MDMA (Ecstasy), urine              Cutoff 500 ng/mL Cocaine Metabolite, urine          Cutoff 300 ng/mL Opiate + metabolites, urine        Cutoff 300 ng/mL Phencyclidine (PCP), urine         Cutoff 25 ng/mL Cannabinoid, urine                 Cutoff 50 ng/mL Barbiturates + metabolites, urine  Cutoff 200 ng/mL Benzodiazepine, urine              Cutoff 200 ng/mL Methadone, urine                   Cutoff 300 ng/mL  The urine drug screen provides only a preliminary, unconfirmed analytical test result and should not be used for non-medical purposes. Clinical consideration and professional judgment should be applied to any positive drug screen result due  to possible interfering substances. A more specific alternate chemical method must be used in order to obtain a confirmed analytical result. Gas chromatography / mass spectrometry (GC/MS) is the preferred confirm atory method. Performed at [facility], [address] [place], [place] [address]   Glucose, capillary     Status: Abnormal   Collection Time: 02/23/23 11:41 AM  Result Value Ref Range   Glucose-Capillary 101 (H) 70 - 99 mg/dL    Comment: Glucose reference range applies only to samples taken after fasting for at least 8 hours.  ECHOCARDIOGRAM COMPLETE     Status: None   Collection Time: 02/23/23  1:40 PM  Result Value Ref Range   Weight 3,361.6 oz   Height 72 in   BP 141/91 mmHg   Ao pk vel 0.87 m/s   AV Area VTI 3.44 cm2   AR max vel 2.90 cm2   AV Mean grad 2.0 mmHg   AV Peak grad 3.0 mmHg   S' Lateral 3.20 cm   AV Area mean vel 2.67 cm2   Area-P 1/2 3.11 cm2   MV VTI 2.07 cm2   Est EF >55   Glucose, capillary     Status: Abnormal   Collection Time: 02/23/23  4:26 PM  Result Value Ref Range   Glucose-Capillary 109 (H) 70 - 99 mg/dL    Comment: Glucose reference range applies only to samples taken after fasting for at least 8 hours.  CBC     Status: None  Collection Time: 03/04/23  8:43 AM  Result Value Ref Range   WBC 6.8 4.0 - 10.5 K/uL   RBC 4.40 4.22 - 5.81 Mil/uL   Platelets 185.0 150.0 - 400.0 K/uL   Hemoglobin 14.4 13.0 - 17.0 g/dL   HCT 21.3 08.6 - 57.8 %   MCV 97.9 78.0 - 100.0 fl   MCHC 33.5 30.0 - 36.0 g/dL   RDW 46.9 62.9 - 52.8 %  Basic Metabolic Panel (BMET)     Status: Abnormal   Collection Time: 03/04/23  8:43 AM  Result Value Ref Range   Sodium 140 135 - 145 mEq/L   Potassium 4.6 3.5 - 5.1 mEq/L   Chloride 102 96 - 112 mEq/L   CO2 28 19 - 32 mEq/L   Glucose, Bld 124 (H) 70 - 99 mg/dL   BUN 20 6 - 23 mg/dL   Creatinine, Ser 4.13 0.40 - 1.50 mg/dL   GFR 24.40 >10.27 mL/min    Comment: Calculated using the CKD-EPI Creatinine  Equation (2021)   Calcium 9.8 8.4 - 10.5 mg/dL    Assessment/Plan:  Hypertension associated with diabetes (HCC) blood pressure control important in reducing the progression of atherosclerotic disease. On appropriate oral medications.   DM2 (diabetes mellitus, type 2) (HCC) blood glucose control important in reducing the progression of atherosclerotic disease. Also, involved in wound healing. On appropriate medications.   Coronary artery disease of native artery of native heart with stable angina pectoris [facility]) Extensive cardiac history.  If we do carotid stenting and avoid general anesthesia, that would be a benefit.  Bilateral carotid artery stenosis I have independently reviewed the CT angiogram of his carotid arteries.  The official report is of a 70% right ICA stenosis and a 60% left ICA stenosis I believe both are somewhat worse than this.  He had clear symptoms of left hemispheric stroke requiring tPA recently.  With a greater than 60% left ICA lesion which is clearly symptomatic, that should be treated to reduce risk of stroke going forward.  I have discussed options with the patient including carotid artery endarterectomy and carotid artery stenting.  I have discussed that on review of his CT scan I believe his anatomy is acceptable for either option.  He does have a significant cardiac history and may be better served from avoiding general anesthesia and having a carotid stent placed.  He does have an upcoming appointment with his cardiologist.  He is desires to have this fixed ASAP and would like to proceed with carotid artery stenting at the next available opportunity.  He is already on dual antiplatelet therapy and statin agent.      Festus Barren 04/21/2023, 8:40 AM   This note was created with Dragon medical transcription system.  Any errors from dictation are unintentional.

## 2023-04-20 NOTE — Assessment & Plan Note (Signed)
Extensive cardiac history.  If we do carotid stenting and avoid general anesthesia, that would be a benefit.

## 2023-04-21 ENCOUNTER — Ambulatory Visit: Payer: Medicare Other

## 2023-04-21 ENCOUNTER — Telehealth (INDEPENDENT_AMBULATORY_CARE_PROVIDER_SITE_OTHER): Payer: Self-pay

## 2023-04-21 DIAGNOSIS — M6281 Muscle weakness (generalized): Secondary | ICD-10-CM

## 2023-04-21 DIAGNOSIS — R269 Unspecified abnormalities of gait and mobility: Secondary | ICD-10-CM | POA: Diagnosis not present

## 2023-04-21 NOTE — Patient Instructions (Signed)
Carotid Angioplasty With Stent, Care After The following information offers guidance on how to care for yourself after your procedure. Your doctor may also give you more specific instructions. If you have problems or questions, contact your doctor. What can I expect after the procedure? After the procedure, it is common to have: Bruising at the site where the small, thin tube (catheter) was inserted. This usually goes away within 1-2 weeks. A small amount of blood or clear fluid coming from your cut from surgery (incision). A small amount of blood collecting under your skin (hematoma) around the tube site. This may form a lump that can be sore and tender. This usually lasts for 1-2 weeks. You may have a test that uses sound waves to take pictures (ultrasound) of the carotid artery. This ultrasound may be compared to future ultrasounds to check for changes in the artery. Follow these instructions at home: Medicines Take over-the-counter and prescription medicines only as told by your doctor. Your doctor may prescribe blood thinners after the procedure to prevent blood clots. If you are taking blood thinners: Talk with your doctor before taking any medicines that have aspirin or NSAIDs, such as ibuprofen. Take medicines exactly as told. Take them at the same time each day. Avoid doing things that could hurt or bruise you. Take action to prevent falls. Wear an alert bracelet or carry a card that shows you are taking blood thinners. Incision care  Follow instructions from your doctor about how to take care of your cut from surgery. Make sure you: Wash your hands with soap and water for at least 20 seconds before and after you change your bandage (dressing). If you cannot use soap and water, use hand sanitizer. Change your bandage. Leave stitches or skin glue in place for at least 2 weeks. Leave tape strips alone unless you are told to take them off. You may trim the edges of the tape strips if  they curl up. Do not rub the site. This may cause bleeding. Keep your cut from surgery clean and dry. Check the area around your cut every day for signs of infection. Check for: More redness, swelling, or pain. More fluid or blood. Warmth. Pus or a bad smell. Activity Return to your normal activities when your doctor says that it is safe. You may have to avoid lifting. Ask your doctor how much you can safely lift. Avoid sex until your doctor says that this is safe for you. Eating and drinking Follow instructions from your doctor about what you may eat and drink. Drink enough fluid to keep your pee (urine) pale yellow. General instructions Avoid straining to poop to keep the cut from surgery from bleeding. Do not smoke or use any products that contain nicotine or tobacco. If you need help quitting, ask your doctor. Tell all your doctors that you have a stent. Keep all follow-up visits. This includes ultrasound appointments to check for any changes in the artery. Contact a doctor if: You have any infection signs. You have a lump caused by bleeding under your skin, and the lump does not go away after 2 weeks. You have a fever. Get help right away if: You have a hard time breathing. You have pain in your chest. You have a lump caused by bleeding under your skin, and the lump gets bigger quickly. Your cut from surgery starts to bleed and does not stop after you hold pressure on it for a few minutes. You have pain in the area   where your stent was placed. You have any signs of a stroke. You may be at risk for a stroke even after this procedure. You may be at greater risk of a stroke if you have diabetes, lung disease, kidney disease, had a previous stroke, or are 80 years of age or older. "BE FAST" is an easy way to remember the main warning signs: B - Balance. Signs are dizziness, sudden trouble walking, or loss of balance. E - Eyes. Signs are trouble seeing or a change in how you see. F -  Face. Signs are sudden weakness or loss of feeling of the face, or the face or eyelid drooping on one side. A - Arms. Signs are weakness or loss of feeling in an arm. This happens suddenly and usually on one side of the body. S - Speech. Signs are sudden trouble speaking, slurred speech, or trouble understanding what people say. T - Time. Time to call emergency services. Write down what time symptoms started. You have other signs of a stroke, such as: A sudden, very bad headache with no known cause. Feeling like you may vomit (nausea). Vomiting. A seizure. These symptoms may be an emergency. Get help right away. Call 911. Do not wait to see if the symptoms will go away. Do not drive yourself to the hospital. This information is not intended to replace advice given to you by your health care provider. Make sure you discuss any questions you have with your health care provider. Document Revised: 03/17/2022 Document Reviewed: 03/17/2022 Elsevier Patient Education  2024 [employer].

## 2023-04-21 NOTE — Assessment & Plan Note (Signed)
I have independently reviewed the CT angiogram of his carotid arteries.  The official report is of a 70% right ICA stenosis and a 60% left ICA stenosis I believe both are somewhat worse than this.  He had clear symptoms of left hemispheric stroke requiring tPA recently.  With a greater than 60% left ICA lesion which is clearly symptomatic, that should be treated to reduce risk of stroke going forward.  I have discussed options with the patient including carotid artery endarterectomy and carotid artery stenting.  I have discussed that on review of his CT scan I believe his anatomy is acceptable for either option.  He does have a significant cardiac history and may be better served from avoiding general anesthesia and having a carotid stent placed.  He does have an upcoming appointment with his cardiologist.  He is desires to have this fixed ASAP and would like to proceed with carotid artery stenting at the next available opportunity.  He is already on dual antiplatelet therapy and statin agent.

## 2023-04-21 NOTE — Telephone Encounter (Unsigned)
Spoke with the patient and he is scheduled with Dr. Wyn Quaker on 04/26/23 with a 9:00 am arrival time to the [facility] for a left carotid stent placement and overnight stay. Pre-procedure instructions were discussed and will be sent to Mychart and mailed.

## 2023-04-21 NOTE — Therapy (Signed)
OUTPATIENT PHYSICAL THERAPY NEURO TREATMENT   Patient Name: Shannon Chung MRN: [id number] DOB:July 21, 1941, 82 y.o., male Today's Date: 04/21/2023   PCP: Dana Allan, MD  REFERRING PROVIDER:   Dana Allan, MD    END OF SESSION:  PT End of Session - 04/21/23 0841     Visit Number 7    Number of Visits 16    Date for PT Re-Evaluation 05/05/23    Progress Note Due on Visit 10    PT Start Time 0841    PT Stop Time 0928    PT Time Calculation (min) 47 min    Equipment Utilized During Treatment Gait belt    Activity Tolerance Patient tolerated treatment well    Behavior During Therapy [facility] for tasks assessed/performed                  Past Medical History:  Diagnosis Date   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 06/2015 Cardiac CT: Ca score 1103 (84th %'ile);  b. 07/2015 Cath: LM 70, LAD 80p, 100/32m, D1 70, D2 95, RI 75, RCA 100p/m;  c. 07/2015 CABG x 5 (LIMA->LAD, VG->Diag, VG->OM1->OM2, VG->OM3).   COVID-19    12/2021   COVID-19 01/18/2022   COVID-19 vaccine administered 01/18/2022   Unknown how many vaccine doses have been received. Entered from Emergency Triage Note.   Diabetes mellitus without complication (HCC) 07/2015   Dyslipidemia    Essential hypertension    Essential hypertension 01/02/2015   Formatting of this note might be different from the original.  Last Assessment & Plan:   Chronic, stable. Continue current regimen.   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 dilated, EF 35% improved to 45-50% (2015);  b. 07/2015 EF 25-35% by LV gram.   Ischemic cardiomyopathy 01/02/2015   Kidney stones 04/17/2021   Lone atrial fibrillation (HCC) 1983   a. isolated episode, not on OAC.   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 06/2014: LV; resolved with coumadin-->no residual on f/u echo, no longer on coumadin.   Mural thrombus of heart 08/26/2021   Formatting of this note might be different from the original. Jun 16, 2019 Entered By: Vinnie Level  DENISE Comment: left ventricle, cardiac apex   Osteoarthritis    a. 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 Jun 16, 2019 Entered By: [name] Comment: leftAug 19, 2020 Entered By: [name] Comment: resolved by total left knee replacement Jun 16, 2019 Entered By: [name] Comment: leftAug 19, 2020 Entered By: [name] Comment: resolved by total left knee replacement   Trigger finger of left hand 07/07/2019   Vitamin D deficiency    Past Surgical History:  Procedure Laterality Date   BICEPS TENDON REPAIR Right 1993   CARDIAC CATHETERIZATION N/A 07/05/2015   Procedure: Left Heart Cath and Coronary Angiography;  Surgeon: Antonieta Iba, MD;  Location: [facility] INVASIVE CV LAB;  Service: Cardiovascular;  Laterality: N/A;   CATARACT EXTRACTION Left 12/2016   with keratoplasty   COLONOSCOPY  2007   COLONOSCOPY WITH PROPOFOL N/A 12/02/2017   TA, SSA, rpt 3 yrs(Tahiliani, Varnita B, MD)   COLONOSCOPY WITH PROPOFOL N/A 11/26/2020   Procedure: COLONOSCOPY WITH PROPOFOL;  Surgeon: Midge Minium, MD;  Location: [facility] ENDOSCOPY;  Service: Endoscopy;  Laterality: N/A;   CORONARY ARTERY BYPASS GRAFT N/A 07/29/2015   Procedure: CORONARY ARTERY BYPASS GRAFTING (CABG) x 5 (LIMA to LAD, SVG to DIAGONAL,  SVG SEQUENTIALLY to OM1 and OM2, SVG to OM3) with Endoscopic Vein Havesting of  GREATER SAPHENOUS VEIN from RIGHT THIGH and partial LOWER LEG ;  Surgeon: Alleen Borne, MD;  Location: [facility] OR;  Service: Open Heart Surgery;  Laterality: N/A;   EYE SURGERY     b/l cataract and cornea replaced    HAND SURGERY     left hand 1st/2nd trigger fingers Dr. Hyacinth Meeker ortho    JOINT REPLACEMENT     KNEE ARTHROSCOPY Left remote   MOHS SURGERY     left cheek scc 2022 Dr. Jeannine Boga   MOHS SURGERY     x 5 facial scc   right biceps tendon     repair/re attachment    SKIN CANCER EXCISION  10/2015   BCC - L ala (pending MOHs) and L scapula (complete excision)   TEE WITHOUT CARDIOVERSION  N/A 07/29/2015   Procedure: TRANSESOPHAGEAL ECHOCARDIOGRAM (TEE);  Surgeon: Alleen Borne, MD;  Location: [facility] OR;  Service: Open Heart Surgery;  Laterality: N/A;   TONSILLECTOMY  1949   TOTAL KNEE ARTHROPLASTY Left 03/18/2016   cemented L TKR; Deeann Saint, MD   Patient Active Problem List   Diagnosis Date Noted   History of CVA (cerebrovascular accident) without residual deficits 03/04/2023   Acute CVA (cerebrovascular accident) (HCC) 02/22/2023   Chronic radicular pain of lower back 08/10/2022   Cervical spondylosis 03/11/2022   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   Bilateral carotid artery  stenosis 03/11/2022   Lumbar spondylosis 10/07/2021   DDD (degenerative disc disease), lumbar 10/07/2021   History of radiation therapy 08/26/2021   Aortic atherosclerosis (HCC) 04/17/2021   Diverticulosis 04/17/2021   Hypertension associated with diabetes (HCC) 10/07/2020   Overweight (BMI 25.0-29.9) 10/07/2020   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   Vitamin D deficiency 08/01/2020   Polyp of sigmoid colon 08/01/2020   Insomnia 06/07/2020   Gastroesophageal reflux disease 04/04/2020   Degenerative joint disease of hand 03/01/2020   BPH (benign prostatic hyperplasia) 08/05/2018   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   Macular scar of both eyes 04/20/2018   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   Scotoma involving central area of both eyes 04/20/2018   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   Fatty liver 03/25/2018   Erectile dysfunction 02/23/2017   Hx of CABG 01/24/2017   Advanced care planning/counseling discussion 10/21/2015   Coronary artery disease of native artery of native heart with stable angina pectoris (HCC)    DM2 (diabetes mellitus, type 2) (HCC) 08/12/2015   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   Hyperlipidemia 01/02/2015   Osteoarthritis 01/02/2015   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/02/2014    ONSET DATE: 02/22/23  REFERRING DIAG: Z86.73 (ICD-10-CM) - History of CVA  (cerebrovascular accident)   THERAPY DIAG:  Abnormality of gait and mobility  Muscle weakness (generalized)  Rationale for Evaluation and Treatment: Rehabilitation  SUBJECTIVE:                                                                                                                                                                                             SUBJECTIVE STATEMENT: Pt has been working- endorsing some right knee soreness and "Clicking"  Pt accompanied by: self  PERTINENT HISTORY: From Neurology 03/10/23 visit: Patient with past medical history significant for CAD (CABG times 03/22/2015), carotid stenosis, hypertension, hyperlipidemia, T2DM, history of CVA. He presented to the ED 02/22/2023 with acute onset right-sided weakness, he received TNK. Patient has no residual symptoms, he presents to establish care with our office. Results of cardiac monitor are pending, he is well-established with cardiology. Patient was taking aspirin 81 mg at the time of presentation to the ED.  Reviewed and discussed results from recent hospitalization including provider notes, CTA head and neck, echocardiogram, CT, MRI brain. Results below. CTA head and neck did reveal left ICA stenosis of 70%, right ICA stenosis of 60%.   PAIN:  Are you having pain? Yes- Right knee- did not rate- states worse at time with bending/steps  PRECAUTIONS: None  WEIGHT BEARING RESTRICTIONS: No  FALLS: Has patient fallen in last 6 months? No  LIVING  ENVIRONMENT: Lives with: lives with their family and lives with their spouse Lives in: House/apartment Stairs: No Has following equipment at home: None  PLOF: Independent  PATIENT GOALS: To ensure he is physically at the level he was prior to the stroke in April.   OBJECTIVE:   DIAGNOSTIC FINDINGS: no acute findings from CVA  COGNITION: Overall cognitive status: Within functional limits for tasks assessed   SENSATION: Not  tested  COORDINATION: WNL  POSTURE: No Significant postural limitations  LOWER EXTREMITY ROM: WNL  LOWER EXTREMITY MMT:    MMT Right Eval Left Eval  Hip flexion 4 4+  Hip extension    Hip abduction 5 5  Hip adduction 5 5  Hip internal rotation 5 5  Hip external rotation 5 5  Knee flexion 4+ 4+  Knee extension 4+ 5  Ankle dorsiflexion 5 5  Ankle plantarflexion    Ankle inversion    Ankle eversion    (Blank rows = not tested)  BED MOBILITY:  WNL  TRANSFERS: Assistive device utilized: None  Sit to stand: Complete Independence Stand to sit: Complete Independence Chair to chair: Complete Independence Floor:  not tested     CURB:   Curb Comments: Reports hesitancy with curb   STAIRS: Level of Assistance: Modified independence Stair Negotiation Technique: Step to Pattern with Bilateral Rails Number of Stairs: 4  Height of Stairs: 6 in  Comments: step to pattern descending  GAIT: Gait pattern: decreased stride length Distance walked: 10 m Assistive device utilized: None Level of assistance: Complete Independence Comments:   FUNCTIONAL TESTS:  5 times sit to stand: 13.86 sec  6 minute walk test: test visit 2  10 meter walk test: 1 m/s Berg Balance Scale: 43 Dynamic Gait Index: 17    PATIENT SURVEYS:  FOTO 67 goal of 77   TODAY'S TREATMENT:                                                                                                                              Exercise/Activity Sets/ Reps/Time/ Resistance Assistance Charge type Comments- Unless otherwise stated, CGA was provided and gait belt donned in order to ensure pt safety   Nustep interval training Level 1-1:30 min L3- 30 sec  L1- 1 min L5- 30 sec L1- 1 min Set up and continuous monitoring for cardiovascular response and to keep SPM > 50. TE Assistance with setup and and adjusting resistance at appropriate times.  O2 sat upon completion= 96% and HR= 87 bpm  Matrix cable LE strengthening:   B hip ext, B knee flex, B Knee ext  2.5 #- cable 2 x 10 reps  Set up and continuous monitoring TE - Obvious weakness observed in right LE vs. Left with single LE cable use.   Sit to stand with airex pad under left foot to emphasize right LE power 12 reps  CGA/gait belt TE - Patient reports as very challenging and "Surprised I could do that."  Resistive right seated ankle DF  GTB- 2 sets of 10 reps Set up TE Patient reports as fatiguing   Heel to Toe (RLE)  gait sequencing - from toe off position on right LE to heel strike (heel on ground and toe pad on 1/2 foam) 2 sets of 15 reps 4# AW CGA, intermittent UE NMR Cues for foot clearance  Negative split step count on 10 MWT distance    2x during 4# AW ambulation,   Gait training  Progressed from 21 steps walking naturally to 18 steps after directed cues for focus on stance time and step length  Treatment provided this session   Pt educated throughout session about proper posture and technique with exercises. Improved exercise technique, movement at target joints, use of target muscles after min to mod verbal, visual, tactile cues.     PATIENT EDUCATION: Education details: POC Person educated: Patient Education method: Explanation Education comprehension: verbalized understanding  HOME EXERCISE PROGRAM: Access Code: [email or web address] URL: [email or web address] Date: 03/17/2023 Prepared by: Thresa Ross  Exercises - Standing Single Leg Stance with Counter Support  - 1 x daily - 7 x weekly - 2 sets - 30 second  hold - Tandem Stance  - 1 x daily - 7 x weekly - 2 sets - 30 second  hold - Forward Step Touch  - 1 x daily - 7 x weekly - 2 sets - 10 reps - Standing March with Counter Support  - 1 x daily - 7 x weekly - 2 sets - 10 reps  GOALS: Goals reviewed with patient? Yes  SHORT TERM GOALS: Target date: 04/07/2023     Patient will be independent in home exercise program to improve strength/mobility for better functional  independence with ADLs. Baseline: No HEP currently  Goal status: INITIAL   LONG TERM GOALS: Target date: 05/05/2023      1.  Patient will increase FOTO score to equal to or greater than  77   to demonstrate statistically significant improvement in mobility and quality of life.  Baseline: 67 Goal status: INITIAL   2.  Patient will increase Berg Balance score by > 6 points to demonstrate decreased fall risk during functional activities. Baseline: 43 Goal status: INITIAL   3.   Patient will improve DGI by 4 points to reduce fall risk and demonstrate improved transfer/gait ability. Baseline: 17 Goal status: INITIAL  4.   Patient will increase six minute walk test distance to >1000 for progression to community ambulator and improve gait ability Baseline: visit 2 test 03/17/23: 850 ft  Goal status: INITIAL    ASSESSMENT:  CLINICAL IMPRESSION:  Treatment continues to focus on strengthening of right LE as well as focusing on gait mechanics including ankle DF with swing phase and eccentric control through swing to stance phase of gait. He presents with functional foot drop that did improve with multimodal cues during session. He was able to increase his step length and improve his gait quality today. He will benefit from further gait training and LE strengthening. Pt will continue to benefit from skilled physical therapy intervention to address impairments, improve QOL, and attain therapy goals.    OBJECTIVE IMPAIRMENTS: Abnormal gait, decreased activity tolerance, decreased balance, and difficulty walking.   ACTIVITY LIMITATIONS: lifting and locomotion level  PARTICIPATION LIMITATIONS: community activity, occupation, and yard work  PERSONAL FACTORS: Age are also affecting patient's functional outcome.   REHAB POTENTIAL: Excellent  CLINICAL DECISION MAKING: Stable/uncomplicated  EVALUATION COMPLEXITY: Low  PLAN:  PT FREQUENCY: 1-2x/week  PT DURATION: 8 weeks  PLANNED  INTERVENTIONS: Therapeutic exercises, Therapeutic activity, Neuromuscular re-education, Balance training, Gait training, Patient/Family education, Self Care, Joint mobilization, Stair training, Dry Needling, Manual therapy, and Re-evaluation  PLAN FOR NEXT SESSION: Continue plan of care, focus on right lower extremity foot clearance as well as dynamic balance challenges.    04/21/2023, 12:07 PM   Lenda Kelp PT  Physical Therapist- [place]  [facility]

## 2023-04-26 ENCOUNTER — Inpatient Hospital Stay
Admission: RE | Admit: 2023-04-26 | Discharge: 2023-04-27 | DRG: 035 | Disposition: A | Discharge status: Home / Self Care | Payer: Medicare Other | Source: Ambulatory Visit | Attending: Vascular Surgery | Admitting: Vascular Surgery

## 2023-04-26 ENCOUNTER — Other Ambulatory Visit: Payer: Self-pay

## 2023-04-26 ENCOUNTER — Encounter
Admission: RE | Disposition: A | Discharge status: Home / Self Care | Payer: Self-pay | Source: Ambulatory Visit | Attending: Vascular Surgery

## 2023-04-26 ENCOUNTER — Inpatient Hospital Stay: Payer: Medicare Other

## 2023-04-26 ENCOUNTER — Encounter: Payer: Self-pay | Admitting: Vascular Surgery

## 2023-04-26 DIAGNOSIS — Z83438 Family history of other disorder of lipoprotein metabolism and other lipidemia: Secondary | ICD-10-CM | POA: Diagnosis not present

## 2023-04-26 DIAGNOSIS — R4701 Aphasia: Secondary | ICD-10-CM | POA: Diagnosis not present

## 2023-04-26 DIAGNOSIS — Z923 Personal history of irradiation: Secondary | ICD-10-CM

## 2023-04-26 DIAGNOSIS — I6523 Occlusion and stenosis of bilateral carotid arteries: Secondary | ICD-10-CM

## 2023-04-26 DIAGNOSIS — I6522 Occlusion and stenosis of left carotid artery: Secondary | ICD-10-CM | POA: Diagnosis present

## 2023-04-26 DIAGNOSIS — R29702 NIHSS score 2: Secondary | ICD-10-CM | POA: Diagnosis present

## 2023-04-26 DIAGNOSIS — I639 Cerebral infarction, unspecified: Secondary | ICD-10-CM

## 2023-04-26 DIAGNOSIS — Z833 Family history of diabetes mellitus: Secondary | ICD-10-CM | POA: Diagnosis not present

## 2023-04-26 DIAGNOSIS — Z7982 Long term (current) use of aspirin: Secondary | ICD-10-CM

## 2023-04-26 DIAGNOSIS — Z951 Presence of aortocoronary bypass graft: Secondary | ICD-10-CM

## 2023-04-26 DIAGNOSIS — I63239 Cerebral infarction due to unspecified occlusion or stenosis of unspecified carotid arteries: Secondary | ICD-10-CM

## 2023-04-26 DIAGNOSIS — Z8521 Personal history of malignant neoplasm of larynx: Secondary | ICD-10-CM

## 2023-04-26 DIAGNOSIS — Z8546 Personal history of malignant neoplasm of prostate: Secondary | ICD-10-CM | POA: Diagnosis not present

## 2023-04-26 DIAGNOSIS — I255 Ischemic cardiomyopathy: Secondary | ICD-10-CM | POA: Diagnosis present

## 2023-04-26 DIAGNOSIS — Z87891 Personal history of nicotine dependence: Secondary | ICD-10-CM | POA: Diagnosis not present

## 2023-04-26 DIAGNOSIS — Z96652 Presence of left artificial knee joint: Secondary | ICD-10-CM | POA: Diagnosis present

## 2023-04-26 DIAGNOSIS — E1169 Type 2 diabetes mellitus with other specified complication: Secondary | ICD-10-CM | POA: Diagnosis present

## 2023-04-26 DIAGNOSIS — Z8249 Family history of ischemic heart disease and other diseases of the circulatory system: Secondary | ICD-10-CM

## 2023-04-26 DIAGNOSIS — Z8673 Personal history of transient ischemic attack (TIA), and cerebral infarction without residual deficits: Secondary | ICD-10-CM | POA: Diagnosis not present

## 2023-04-26 DIAGNOSIS — I152 Hypertension secondary to endocrine disorders: Secondary | ICD-10-CM | POA: Diagnosis present

## 2023-04-26 DIAGNOSIS — Z8616 Personal history of COVID-19: Secondary | ICD-10-CM | POA: Diagnosis not present

## 2023-04-26 DIAGNOSIS — E785 Hyperlipidemia, unspecified: Secondary | ICD-10-CM | POA: Diagnosis present

## 2023-04-26 DIAGNOSIS — Z7984 Long term (current) use of oral hypoglycemic drugs: Secondary | ICD-10-CM | POA: Diagnosis not present

## 2023-04-26 DIAGNOSIS — I252 Old myocardial infarction: Secondary | ICD-10-CM | POA: Diagnosis not present

## 2023-04-26 DIAGNOSIS — I959 Hypotension, unspecified: Secondary | ICD-10-CM | POA: Diagnosis not present

## 2023-04-26 DIAGNOSIS — I472 Ventricular tachycardia, unspecified: Secondary | ICD-10-CM | POA: Diagnosis not present

## 2023-04-26 DIAGNOSIS — Z7902 Long term (current) use of antithrombotics/antiplatelets: Secondary | ICD-10-CM

## 2023-04-26 DIAGNOSIS — I2510 Atherosclerotic heart disease of native coronary artery without angina pectoris: Secondary | ICD-10-CM | POA: Diagnosis present

## 2023-04-26 DIAGNOSIS — Z85828 Personal history of other malignant neoplasm of skin: Secondary | ICD-10-CM | POA: Diagnosis not present

## 2023-04-26 DIAGNOSIS — K219 Gastro-esophageal reflux disease without esophagitis: Secondary | ICD-10-CM | POA: Diagnosis present

## 2023-04-26 DIAGNOSIS — Z808 Family history of malignant neoplasm of other organs or systems: Secondary | ICD-10-CM

## 2023-04-26 DIAGNOSIS — Z79899 Other long term (current) drug therapy: Secondary | ICD-10-CM

## 2023-04-26 DIAGNOSIS — Z811 Family history of alcohol abuse and dependence: Secondary | ICD-10-CM

## 2023-04-26 HISTORY — PX: CAROTID PTA/STENT INTERVENTION: CATH118231

## 2023-04-26 LAB — POCT ACTIVATED CLOTTING TIME: Activated Clotting Time: 256 seconds

## 2023-04-26 LAB — CREATININE, SERUM
Creatinine, Ser: 0.9 mg/dL (ref 0.61–1.24)
GFR, Estimated: >60 mL/min (ref >60)

## 2023-04-26 LAB — BUN: BUN: 15 mg/dL (ref 8–23)

## 2023-04-26 SURGERY — CAROTID PTA/STENT INTERVENTION
Anesthesia: Moderate Sedation | Laterality: Left

## 2023-04-26 MED ORDER — FAMOTIDINE IN NACL 20-0.9 MG/50ML-% IV SOLN
20.0000 mg | Freq: Two times a day (BID) | INTRAVENOUS | Status: DC
  Administered 2023-04-26: 20 mg via INTRAVENOUS
  Filled 2023-04-26 (×2): qty 50

## 2023-04-26 MED ORDER — CLOPIDOGREL BISULFATE 75 MG PO TABS
75.0000 mg | ORAL_TABLET | Freq: Every day | ORAL | Status: DC
  Administered 2023-04-26 – 2023-04-27 (×2): 75 mg via ORAL
  Filled 2023-04-26 (×3): qty 1

## 2023-04-26 MED ORDER — MIDAZOLAM HCL 2 MG/2ML IJ SOLN
INTRAMUSCULAR | Status: AC
  Filled 2023-04-26: qty 2

## 2023-04-26 MED ORDER — HYDRALAZINE HCL 20 MG/ML IJ SOLN: 5.0000 mg | INTRAMUSCULAR | Status: DC | PRN

## 2023-04-26 MED ORDER — CEFAZOLIN SODIUM-DEXTROSE 2-4 GM/100ML-% IV SOLN
INTRAVENOUS | Status: AC
  Filled 2023-04-26: qty 100

## 2023-04-26 MED ORDER — SODIUM CHLORIDE 0.9 % IV BOLUS
500.0000 mL | Freq: Once | INTRAVENOUS | Status: AC
  Administered 2023-04-26: 500 mL via INTRAVENOUS

## 2023-04-26 MED ORDER — MAGNESIUM SULFATE 2 GM/50ML IV SOLN: 2.0000 g | Freq: Every day | INTRAVENOUS | Status: DC | PRN

## 2023-04-26 MED ORDER — MORPHINE SULFATE (PF) 4 MG/ML IV SOLN: 2.0000 mg | INTRAVENOUS | Status: DC | PRN

## 2023-04-26 MED ORDER — OXYCODONE-ACETAMINOPHEN 5-325 MG PO TABS: 1.0000 | ORAL_TABLET | ORAL | Status: DC | PRN

## 2023-04-26 MED ORDER — PREDNISOLONE ACETATE 1 % OP SUSP
1.0000 [drp] | Freq: Every day | OPHTHALMIC | Status: DC
  Filled 2023-04-26: qty 1

## 2023-04-26 MED ORDER — METFORMIN HCL 500 MG PO TABS: 1000.0000 mg | ORAL_TABLET | Freq: Every day | ORAL | Status: DC

## 2023-04-26 MED ORDER — ONDANSETRON HCL 4 MG/2ML IJ SOLN: 4.0000 mg | Freq: Four times a day (QID) | INTRAMUSCULAR | Status: DC | PRN

## 2023-04-26 MED ORDER — FAMOTIDINE 20 MG PO TABS: 40.0000 mg | ORAL_TABLET | Freq: Once | ORAL | Status: DC | PRN

## 2023-04-26 MED ORDER — SODIUM CHLORIDE 0.9 % IV SOLN: INTRAVENOUS | Status: DC

## 2023-04-26 MED ORDER — EZETIMIBE 10 MG PO TABS
10.0000 mg | ORAL_TABLET | Freq: Every day | ORAL | Status: DC
  Administered 2023-04-26 – 2023-04-27 (×2): 10 mg via ORAL
  Filled 2023-04-26 (×2): qty 1

## 2023-04-26 MED ORDER — VITAMIN D 25 MCG (1000 UNIT) PO TABS
5000.0000 [IU] | ORAL_TABLET | Freq: Every day | ORAL | Status: DC
  Administered 2023-04-26 – 2023-04-27 (×2): 5000 [IU] via ORAL
  Filled 2023-04-26 (×2): qty 5

## 2023-04-26 MED ORDER — METHYLPREDNISOLONE SODIUM SUCC 125 MG IJ SOLR: 125.0000 mg | Freq: Once | INTRAMUSCULAR | Status: DC | PRN

## 2023-04-26 MED ORDER — LISINOPRIL 5 MG PO TABS
10.0000 mg | ORAL_TABLET | Freq: Every day | ORAL | Status: DC
  Filled 2023-04-26 (×2): qty 2

## 2023-04-26 MED ORDER — HEPARIN SODIUM (PORCINE) 1000 UNIT/ML IJ SOLN
INTRAMUSCULAR | Status: AC
  Filled 2023-04-26: qty 10

## 2023-04-26 MED ORDER — SODIUM CHLORIDE 0.9 % IV SOLN
INTRAVENOUS | Status: DC
  Administered 2023-04-26: 1000 mL via INTRAVENOUS

## 2023-04-26 MED ORDER — ATROPINE SULFATE 1 MG/10ML IJ SOSY
PREFILLED_SYRINGE | INTRAMUSCULAR | Status: AC
  Filled 2023-04-26: qty 20

## 2023-04-26 MED ORDER — DIPHENHYDRAMINE HCL 50 MG/ML IJ SOLN: 50.0000 mg | Freq: Once | INTRAMUSCULAR | Status: DC | PRN

## 2023-04-26 MED ORDER — IOHEXOL 350 MG/ML SOLN
75.0000 mL | Freq: Once | INTRAVENOUS | Status: AC | PRN
  Administered 2023-04-26: 75 mL via INTRAVENOUS

## 2023-04-26 MED ORDER — MIDAZOLAM HCL 2 MG/2ML IJ SOLN
INTRAMUSCULAR | Status: DC | PRN
  Administered 2023-04-26: 1 mg via INTRAVENOUS

## 2023-04-26 MED ORDER — LABETALOL HCL 5 MG/ML IV SOLN: 10.0000 mg | INTRAVENOUS | Status: DC | PRN

## 2023-04-26 MED ORDER — ALUM & MAG HYDROXIDE-SIMETH 200-200-20 MG/5ML PO SUSP: 15.0000 mL | ORAL | Status: DC | PRN

## 2023-04-26 MED ORDER — DOPAMINE-DEXTROSE 3.2-5 MG/ML-% IV SOLN
INTRAVENOUS | Status: AC
  Filled 2023-04-26: qty 250

## 2023-04-26 MED ORDER — ACETAMINOPHEN 325 MG RE SUPP: 325.0000 mg | RECTAL | Status: DC | PRN

## 2023-04-26 MED ORDER — DOPAMINE-DEXTROSE 3.2-5 MG/ML-% IV SOLN
0.0000 ug/kg/min | INTRAVENOUS | Status: DC
  Administered 2023-04-26: 5 ug/kg/min via INTRAVENOUS
  Filled 2023-04-26: qty 250

## 2023-04-26 MED ORDER — CEFAZOLIN SODIUM-DEXTROSE 2-4 GM/100ML-% IV SOLN
2.0000 g | Freq: Three times a day (TID) | INTRAVENOUS | Status: AC
  Administered 2023-04-26 – 2023-04-27 (×2): 2 g via INTRAVENOUS
  Filled 2023-04-26 (×2): qty 100

## 2023-04-26 MED ORDER — ATORVASTATIN CALCIUM 20 MG PO TABS: 40.0000 mg | ORAL_TABLET | Freq: Every day | ORAL | Status: DC

## 2023-04-26 MED ORDER — MIDAZOLAM HCL 2 MG/ML PO SYRP: 8.0000 mg | ORAL_SOLUTION | Freq: Once | ORAL | Status: DC | PRN

## 2023-04-26 MED ORDER — PHENYLEPHRINE 80 MCG/ML (10ML) SYRINGE FOR IV PUSH (FOR BLOOD PRESSURE SUPPORT)
PREFILLED_SYRINGE | INTRAVENOUS | Status: AC
  Filled 2023-04-26: qty 10

## 2023-04-26 MED ORDER — SODIUM CHLORIDE 0.9 % IV BOLUS: 500.0000 mL | Freq: Once | INTRAVENOUS | Status: DC

## 2023-04-26 MED ORDER — METOPROLOL TARTRATE 5 MG/5ML IV SOLN: 2.0000 mg | INTRAVENOUS | Status: DC | PRN

## 2023-04-26 MED ORDER — HEPARIN SODIUM (PORCINE) 1000 UNIT/ML IJ SOLN
INTRAMUSCULAR | Status: DC | PRN
  Administered 2023-04-26: 8000 [IU] via INTRAVENOUS
  Administered 2023-04-26: 2000 [IU] via INTRAVENOUS

## 2023-04-26 MED ORDER — VITAMIN B-12 1000 MCG PO TABS
500.0000 ug | ORAL_TABLET | Freq: Every day | ORAL | Status: DC
  Administered 2023-04-26 – 2023-04-27 (×2): 500 ug via ORAL
  Filled 2023-04-26 (×2): qty 1

## 2023-04-26 MED ORDER — HEPARIN (PORCINE) IN NACL 1000-0.9 UT/500ML-% IV SOLN
INTRAVENOUS | Status: DC | PRN
  Administered 2023-04-26: 1000 mL

## 2023-04-26 MED ORDER — HEPARIN (PORCINE) IN NACL 2000-0.9 UNIT/L-% IV SOLN
INTRAVENOUS | Status: DC | PRN
  Administered 2023-04-26: 2000 mL

## 2023-04-26 MED ORDER — SODIUM CHLORIDE 0.9 % IV SOLN
500.0000 mL | Freq: Once | INTRAVENOUS | Status: AC | PRN
  Administered 2023-04-26: 500 mL via INTRAVENOUS

## 2023-04-26 MED ORDER — METOPROLOL SUCCINATE ER 50 MG PO TB24
25.0000 mg | ORAL_TABLET | Freq: Every day | ORAL | Status: DC
  Filled 2023-04-26: qty 1

## 2023-04-26 MED ORDER — PHENOL 1.4 % MT LIQD: 1.0000 | OROMUCOSAL | Status: DC | PRN

## 2023-04-26 MED ORDER — ACETAMINOPHEN 325 MG PO TABS
325.0000 mg | ORAL_TABLET | ORAL | Status: DC | PRN
  Administered 2023-04-27: 650 mg via ORAL
  Filled 2023-04-26: qty 2

## 2023-04-26 MED ORDER — PANTOPRAZOLE SODIUM 20 MG PO TBEC
20.0000 mg | DELAYED_RELEASE_TABLET | Freq: Every day | ORAL | Status: DC
  Administered 2023-04-26 – 2023-04-27 (×2): 20 mg via ORAL
  Filled 2023-04-26 (×2): qty 1

## 2023-04-26 MED ORDER — HYDROMORPHONE HCL 1 MG/ML IJ SOLN: 1.0000 mg | Freq: Once | INTRAMUSCULAR | Status: DC | PRN

## 2023-04-26 MED ORDER — POTASSIUM CHLORIDE CRYS ER 20 MEQ PO TBCR: 20.0000 meq | EXTENDED_RELEASE_TABLET | Freq: Every day | ORAL | Status: DC | PRN

## 2023-04-26 MED ORDER — FENTANYL CITRATE PF 50 MCG/ML IJ SOSY
PREFILLED_SYRINGE | INTRAMUSCULAR | Status: AC
  Filled 2023-04-26: qty 1

## 2023-04-26 MED ORDER — FAMOTIDINE 20 MG PO TABS: 20.0000 mg | ORAL_TABLET | Freq: Two times a day (BID) | ORAL | Status: DC

## 2023-04-26 MED ORDER — PHENYLEPHRINE HCL (PRESSORS) 10 MG/ML IV SOLN
INTRAVENOUS | Status: AC
  Filled 2023-04-26: qty 1

## 2023-04-26 MED ORDER — ATROPINE SULFATE 1 MG/10ML IJ SOSY
PREFILLED_SYRINGE | INTRAMUSCULAR | Status: DC | PRN
  Administered 2023-04-26: 1 mg via INTRAVENOUS

## 2023-04-26 MED ORDER — PHENYLEPHRINE HCL-NACL 20-0.9 MG/250ML-% IV SOLN
INTRAVENOUS | Status: AC
  Filled 2023-04-26: qty 250

## 2023-04-26 MED ORDER — ASPIRIN 81 MG PO TBEC
162.0000 mg | DELAYED_RELEASE_TABLET | Freq: Every day | ORAL | Status: DC
  Administered 2023-04-26 – 2023-04-27 (×2): 162 mg via ORAL
  Filled 2023-04-26 (×2): qty 2

## 2023-04-26 MED ORDER — CEFAZOLIN SODIUM-DEXTROSE 2-4 GM/100ML-% IV SOLN
2.0000 g | INTRAVENOUS | Status: AC
  Administered 2023-04-26: 2 g via INTRAVENOUS

## 2023-04-26 MED ORDER — IODIXANOL 320 MG/ML IV SOLN
INTRAVENOUS | Status: DC | PRN
  Administered 2023-04-26: 65 mL via INTRA_ARTERIAL

## 2023-04-26 MED ORDER — GUAIFENESIN-DM 100-10 MG/5ML PO SYRP: 15.0000 mL | ORAL_SOLUTION | ORAL | Status: DC | PRN

## 2023-04-26 MED ORDER — FLUTICASONE PROPIONATE 50 MCG/ACT NA SUSP: 1.0000 | Freq: Every day | NASAL | Status: DC | PRN

## 2023-04-26 MED ORDER — ACETAMINOPHEN 500 MG PO TABS: 500.0000 mg | ORAL_TABLET | Freq: Four times a day (QID) | ORAL | Status: DC | PRN

## 2023-04-26 MED ORDER — FENTANYL CITRATE (PF) 100 MCG/2ML IJ SOLN
INTRAMUSCULAR | Status: DC | PRN
  Administered 2023-04-26: 50 ug via INTRAVENOUS

## 2023-04-26 SURGICAL SUPPLY — 22 items
BALLN VIATRAC 5X40X135 (BALLOONS) ×1
BALLOON VIATRAC 5X40X135 (BALLOONS) IMPLANT
CATH ANGIO 5F PIGTAIL 100CM (CATHETERS) IMPLANT
CATH BEACON 5 .035 100 H1 TIP (CATHETERS) IMPLANT
COVER DRAPE FLUORO 36X44 (DRAPES) IMPLANT
DEVICE EMBOSHIELD NAV6 4.0-7.0 (FILTER) IMPLANT
DEVICE SAFEGUARD 24CM (GAUZE/BANDAGES/DRESSINGS) IMPLANT
DEVICE STARCLOSE SE CLOSURE (Vascular Products) IMPLANT
DEVICE TORQUE .025-.038 (MISCELLANEOUS) IMPLANT
GLIDEWIRE ANGLED SS 035X260CM (WIRE) IMPLANT
GUIDEWIRE VASC STIFF .038X260 (WIRE) IMPLANT
KIT CAROTID MANIFOLD (MISCELLANEOUS) IMPLANT
KIT ENCORE 26 ADVANTAGE (KITS) IMPLANT
PACK ANGIOGRAPHY (CUSTOM PROCEDURE TRAY) ×1 IMPLANT
SHEATH BRITE TIP 5FRX11 (SHEATH) IMPLANT
SHEATH SHUTTLE SELECT 6F (SHEATH) IMPLANT
STENT XACT CAR 9-7X30X136 (Permanent Stent) IMPLANT
STENT XACT CAR 9-7X40X136 (Permanent Stent) IMPLANT
SUT MNCRL AB 4-0 PS2 18 (SUTURE) IMPLANT
SYR MEDRAD MARK 7 150ML (SYRINGE) IMPLANT
TUBING CONTRAST HIGH PRESS 72 (TUBING) IMPLANT
WIRE GUIDERIGHT .035X150 (WIRE) IMPLANT

## 2023-04-26 NOTE — Code Documentation (Addendum)
Stroke Response Nurse Documentation Code Documentation  Tyrique Sporn is a 82 y.o. male admitted to [facility] on 04/26/2023 for left carotid stent procedure with past medical hx of mural thrombus of cardiac apex. Ischemic cardiomyopathy, HTN, HLD, lone a-fib, CAD, DM, heart attack, heart disease, CVA, received TNK 6 weeks ago. On aspirin 81 mg daily and clopidogrel 75 mg daily. Code stroke was activated by ICU RN.   Patient on ICU unit where LKW is unclear and now complaining of expressive aphasia. Patient returned from the cath lab to ICU around 1400. Pt's RN at bedside reports patient was answering questions appropriately at this time. Around 1430 patient's visitors notified staff that patient wasn't speaking like he normally does. Code stroke activated.   Stroke team at the bedside after patient activation. Patient to CT with team. NIHSS 2, see documentation for details and code stroke times. Patient with disoriented and Expressive aphasia  on exam. The following imaging was completed:  CT Head and MRI. Patient is not a candidate for IV Thrombolytic due to outside window, per MD. Patient is not a candidate for IR due to no LVO on MRI, per MD.   Care/Plan: NIHSS every 1 hour x 4 hours (until 1900) followed by every 2 hours. VS per order Swallow screen per order.   Bedside handoff with RN Jackquline Bosch.    Wille Glaser  Stroke Response RN

## 2023-04-26 NOTE — Progress Notes (Signed)
   04/26/23 1500  Spiritual Encounters  Type of Visit Initial  Care provided to: Patient  Conversation partners present during encounter Nurse  Referral source Code page  Reason for visit Code  OnCall Visit Yes   Chaplain responded to Code Stroke. Pt was going to get a scan when chaplain arrived.

## 2023-04-26 NOTE — Consult Note (Addendum)
Neurology Consultation  Reason for Consult: Speech changes after carotid stenting-code stroke Referring Physician: Dr. Wyn Quaker  CC: Speech difficulty  History is obtained from: Wife, patient, chart  HPI: Shannon Chung is a 82 y.o. 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  Quick assessment by bedside RN upon getting to the unit after the procedure was unremarkable but a full stroke scale was not performed at that time.  Some friends came to visit and noticed that his speech was getting somewhat garbled.  Although the last known well documented by the nurses 1430 hrs., that was the time of symptom recognition and not the last known well. Last known well is unclear-likely prior to going for the procedure before 9 AM but a clear last known well could not be ascertained. Patient was able to provide a pretty decent history but towards the end of longer sentences he started making word salad.  Denies tingling numbness weakness or visual symptoms.  Report systolic blood pressure in the 100s in the recovery area requiring administration of atropine x 1 per RN verbal report.  At the time of evaluation, systolic blood pressure in the 140s.  LKW: Unclear-probably before his procedure with a H&P time indicated around 9:15 AM.  Symptom recognition 2:30 PM. IV thrombolysis given?: no,  outside the window-unclear last known well EVT no LVO Premorbid modified Rankin scale (mRS):1  ROS: Full ROS was performed and is negative except as noted in the HPI.   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 06/2015 Cardiac CT: Ca score 1103 (84th %'ile);  b. 07/2015 Cath: LM 70, LAD 80p, 100/85m, D1 70, D2 95, RI 75, RCA 100p/m;  c. 07/2015 CABG x 5 (LIMA->LAD, VG->Diag, VG->OM1->OM2, VG->OM3).   COVID-19    12/2021   COVID-19 01/18/2022   COVID-19 vaccine administered 01/18/2022   Unknown how many vaccine doses have been received. Entered from Emergency Triage Note.   Diabetes mellitus without complication (HCC) 07/2015   Dyslipidemia    Essential hypertension    Essential hypertension 01/02/2015   Formatting of this note might be different from the original.  Last Assessment & Plan:   Chronic, stable. Continue current regimen.   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 dilated, EF 35% improved to 45-50% (2015);  b. 07/2015 EF 25-35% by LV gram.   Ischemic cardiomyopathy 01/02/2015   Kidney stones 04/17/2021   Lone atrial fibrillation (HCC) 1983   a. isolated episode, not on OAC.   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 06/2014: LV; resolved with coumadin-->no residual on f/u echo, no longer on coumadin.   Mural thrombus of heart 08/26/2021   Formatting of this note might be different from the original. Jun 16, 2019 Entered By: [name] Comment: left ventricle, cardiac apex   Osteoarthritis    a. 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 Jun 16, 2019 Entered By: [name] Comment: leftAug 19, 2020 Entered By: [name] Comment: resolved by total left knee replacement Jun 16, 2019 Entered By: [name] Comment: leftAug 19, 2020 Entered By: [name] Comment: resolved by total left knee replacement   Trigger finger of left hand 07/07/2019   Vitamin D deficiency      Family History   Problem Relation Age of Onset   CAD Father 41       MI   Hypertension Father    Hyperlipidemia Father    Alcoholism Father    Diabetes Father    Cancer Daughter        dx'ed 38 retroperitoneal liposarcoma      Social History:   reports that he quit smoking about 44 years ago. His smoking use included cigarettes. He has a 5.00 pack-year smoking history. He has never used smokeless tobacco. He reports current alcohol use. He reports that he does not use drugs.  Medications  Current Facility-Administered Medications:    0.9 %  sodium chloride infusion, , Intravenous, Continuous, Dew, Marlow Baars, MD   0.9 %  sodium chloride infusion, , Intravenous, Continuous, Milon Dikes, MD   acetaminophen (TYLENOL) tablet 325-650 mg, 325-650 mg, Oral, Q4H PRN **OR** acetaminophen (TYLENOL) suppository 325-650 mg, 325-650 mg, Rectal, Q4H PRN, Wyn Quaker, Marlow Baars, MD   acetaminophen (TYLENOL) tablet 500 mg, 500 mg, Oral, Q6H PRN, Wyn Quaker, Marlow Baars, MD   alum &  mag hydroxide-simeth (MAALOX/MYLANTA) 200-200-20 MG/5ML suspension 15-30 mL, 15-30 mL, Oral, Q2H PRN, Dew, Marlow Baars, MD   aspirin EC tablet 162 mg, 162 mg, Oral, Daily, Dew, Marlow Baars, MD   atorvastatin (LIPITOR) tablet 40 mg, 40 mg, Oral, QHS, Dew, Marlow Baars, MD   ceFAZolin (ANCEF) IVPB 2g/100 mL premix, 2 g, Intravenous, Q8H, Dew, Marlow Baars, MD   cholecalciferol (VITAMIN D3) 25 MCG (1000 UNIT) tablet 5,000 Units, 5,000 Units, Oral, Daily, Dew, Marlow Baars, MD   clopidogrel (PLAVIX) tablet 75 mg, 75 mg, Oral, Daily, Dew, Marlow Baars, MD   cyanocobalamin (VITAMIN B12) tablet 500 mcg, 500 mcg, Oral, Daily, Dew, Marlow Baars, MD   ezetimibe (ZETIA) tablet 10 mg, 10 mg, Oral, Daily, Dew, Marlow Baars, MD   famotidine (PEPCID) IVPB 20 mg premix, 20 mg, Intravenous, Q12H, Dew, Marlow Baars, MD   fluticasone (FLONASE) 50 MCG/ACT nasal spray 1 spray, 1 spray, Each Nare, Daily PRN, Wyn Quaker, Marlow Baars, MD   guaiFENesin-dextromethorphan (ROBITUSSIN DM) 100-10 MG/5ML syrup 15 mL, 15 mL, Oral, Q4H PRN,  Wyn Quaker, Marlow Baars, MD   hydrALAZINE (APRESOLINE) injection 5 mg, 5 mg, Intravenous, Q20 Min PRN, Wyn Quaker, Marlow Baars, MD   HYDROmorphone (DILAUDID) injection 1 mg, 1 mg, Intravenous, Once PRN, Annice Needy, MD   labetalol (NORMODYNE) injection 10 mg, 10 mg, Intravenous, Q10 min PRN, Wyn Quaker, Marlow Baars, MD   lisinopril (ZESTRIL) tablet 10 mg, 10 mg, Oral, Daily, Dew, Marlow Baars, MD   magnesium sulfate IVPB 2 g 50 mL, 2 g, Intravenous, Daily PRN, Annice Needy, MD   [START ON 04/29/2023] metFORMIN (GLUCOPHAGE) tablet 1,000 mg, 1,000 mg, Oral, Q supper, Wyn Quaker, Marlow Baars, MD   metoprolol succinate (TOPROL-XL) 24 hr tablet 25 mg, 25 mg, Oral, Daily, Dew, Marlow Baars, MD   metoprolol tartrate (LOPRESSOR) injection 2-5 mg, 2-5 mg, Intravenous, Q2H PRN, Wyn Quaker, Marlow Baars, MD   morphine (PF) 4 MG/ML injection 2-5 mg, 2-5 mg, Intravenous, Q1H PRN, Wyn Quaker, Marlow Baars, MD   ondansetron (ZOFRAN) injection 4 mg, 4 mg, Intravenous, Q6H PRN, Wyn Quaker, Marlow Baars, MD   ondansetron (ZOFRAN) injection 4 mg, 4 mg, Intravenous, Q6H PRN, Wyn Quaker, Marlow Baars, MD   oxyCODONE-acetaminophen (PERCOCET/ROXICET) 5-325 MG per tablet 1-2 tablet, 1-2 tablet, Oral, Q4H PRN, Wyn Quaker, Marlow Baars, MD   pantoprazole (PROTONIX) EC tablet 20 mg, 20 mg, Oral, Daily, Dew, Marlow Baars, MD   phenol (CHLORASEPTIC) mouth spray 1 spray, 1 spray, Mouth/Throat, PRN, Wyn Quaker, Marlow Baars, MD   potassium chloride SA (KLOR-CON M) CR tablet 20-40 mEq, 20-40 mEq, Oral, Daily PRN, Wyn Quaker, Marlow Baars, MD   prednisoLONE acetate (PRED FORTE) 1 % ophthalmic suspension 1 drop, 1 drop, Both Eyes, Daily, Dew, Marlow Baars, MD   sodium chloride 0.9 % bolus 500 mL, 500 mL, Intravenous, Once, Wyn Quaker, Marlow Baars, MD   Exam: Current vital signs: BP 133/70   Pulse (!) 34   Temp 98.6 F (37 C) (Oral) Comment: code stroke called  Resp 15   Ht 6' (1.829 m)   Wt 93 kg   SpO2 99%   BMI 27.80 kg/m  Vital signs in last 24 hours: Temp:  [98 F (36.7 C)-98.7 F (37.1 C)] 98.6 F (37 C) (06/24 1435) Pulse Rate:  [29-75] 34 (06/24 1525) Resp:   [9-29] 15 (06/24 1545) BP: (85-151)/(48-110) 133/70 (06/24 1545) SpO2:  [91 %-99 %] 99 % (06/24 1525) Weight:  [93 kg] 93 kg (06/24 0905) General: Awake alert no distress HEENT: Normocephalic atraumatic Lungs: Clear Cardiovascular: Regular rhythm  Abdomen nondistended nontender Neurological exam Awake alert oriented to person place but could not tell the correct month. No dysarthria Mild aphasia-on longer sentences, at the end words become garbled. Unable to read the NIH stroke scale card sentences correctly and completely. Cranial nerves II to XII intact Motor examination with no drift Sensation intact without extinction Coordination examination unremarkable NIH stroke scale-2  Labs I have reviewed labs in epic and the results pertinent to this consultation are:  CBC    Component Value Date/Time   WBC 6.8 03/04/2023 0843   RBC 4.40 03/04/2023 0843   HGB 14.4 03/04/2023 0843   HGB 14.6 11/28/2014 0000   HGB 14.6 11/28/2014 0000   HCT 43.0 03/04/2023 0843   PLT 185.0 03/04/2023 0843   PLT 158 11/28/2014 0000   MCV 97.9 03/04/2023 0843   MCH 32.6 02/22/2023 1340   MCHC 33.5 03/04/2023 0843   RDW 13.4 03/04/2023 0843   LYMPHSABS 3.1 02/22/2023 1340   MONOABS 0.5 02/22/2023 1340   EOSABS 0.1 02/22/2023 1340   BASOSABS 0.1 02/22/2023 1340    CMP     Component Value Date/Time   NA 140 03/04/2023 0843   NA 141 11/28/2014 0000   NA 141 11/28/2014 0000   K 4.6 03/04/2023 0843   CL 102 03/04/2023 0843   CO2 28 03/04/2023 0843   GLUCOSE 124 (H) 03/04/2023 0843   BUN 15 04/26/2023 0914   BUN 12 11/28/2014 0000   CREATININE 0.90 04/26/2023 0914   CREATININE 1.06 09/02/2018 0948   CALCIUM 9.8 03/04/2023 0843   PROT 7.2 02/22/2023 1340   PROT 7.4 01/23/2016 0803   ALBUMIN 4.2 02/22/2023 1340   ALBUMIN 4.8 01/23/2016 0803   ALBUMIN 4.0 11/28/2014 0000   AST 24 02/22/2023 1340   AST 21 11/28/2014 0000   AST 21 11/28/2014 0000   ALT 12 02/22/2023 1340   ALT 27  11/28/2014 0000   ALT 27 11/28/2014 0000   ALKPHOS 47 02/22/2023 1340   ALKPHOS 53 11/28/2014 0000   ALKPHOS 53 11/28/2014 0000   BILITOT 0.6 02/22/2023 1340   BILITOT 0.6 01/23/2016 0803   BILITOT 0.5 11/28/2014 0000   BILITOT 0.5 11/28/2014 0000   GFRNONAA >60 04/26/2023 0914   GFRAA >60 11/29/2017 1039    Lipid Panel     Component Value Date/Time   CHOL 139 02/23/2023 0414   CHOL 123 01/23/2016 0803   CHOL 155 11/28/2014 0000   CHOL 155 11/28/2014 0000   TRIG 117 02/23/2023 0414   TRIG 126 11/28/2014 0000   TRIG 126 11/28/2014 0000   HDL 46 02/23/2023 0414   HDL 49 01/23/2016 0803   HDL 48 11/28/2014 0000   CHOLHDL 3.0 02/23/2023 0414   VLDL 23 02/23/2023 0414   LDLCALC 70 02/23/2023 0414   LDLCALC 51 01/23/2016 0803   LDLCALC 82 11/28/2014 0000   LDLCALC 82 11/28/2014 0000   Imaging I have reviewed the images obtained:  CT-head-unremarkable.  No bleed. CT angio head and neck-interval placement of stent in the distal left common and proximal left ICA with beam hardening artifact limiting evaluation for in-stent thrombus however contrast is seen at proximal or distal end-presumed to be patent.  Unchanged 60% proximal right ICA stenosis.  Severe right V2 mild left V2 and moderate left supraclinoid ICA and left P2 stenosis.  No intracranial LVO.  Stat MRI brain: Remote left MCA ACA watershed infarcts.  No acute infarction.  Assessment:  82 year old with multiple cerebrovascular and cardiovascular risk factors  with a recent left hemispheric aborted stroke/TIA status post TNK in April 2024, had presented for left carotid stenting and upon arriving to the ICU was noted to have garbled speech and word salad. Code stroke activated.  On my assessment, NIH stroke scale 2. His last known well was unclear.  Symptoms were not picked up until friends came and spoke with him.  Initial assessment in the ICU was limited and did not pick up any deficits.  Last known well was likely prior  to his procedure. He also had dropping of his blood pressures to systolic of 100 postprocedurally. I suspect that he had hypoperfusion leading to a left hemispheric TIA in the setting of left carotid stenosis which has recently been revascularized. I have asked him for a stat MRI to rule out a large stroke that might require endovascular thrombectomy-MRI was negative.   Impression: Likely left hemispheric TIA in the setting of periprocedural hypoperfusion due to presence of hypotension and left carotid stenosis.  Recommendations: He recently had all the stroke risk factor workup done.  No need to repeat. Okay to continue atorvastatin 40 mg daily Dual antiplatelets post stent per vascular surgery Telemetry-concern for abnormal heart rhythm on telemetry monitor.  Obtain twelve-lead EKG to rule out A-fib. Frequent neurochecks-every hour neurochecks for next 4 hours followed by every 2 hours neurochecks. Stat CT head in case of any neurochange.  Please also call neurology at that time. At this point, mainstay of his treatment would be avoidance of hypotension.  I would recommend blood pressure parameters to be strictly systolic blood pressure between 140-180.  Use pressors if necessary. I will follow with you. Plan was relayed to Dr. Wyn Quaker  -- Milon Dikes, MD Neurologist [facility] Pager: [phone]   CRITICAL CARE ATTESTATION Performed by: Milon Dikes, MD Total critical care time: 80 minutes Critical care time was exclusive of separately billable procedures and treating other patients and/or supervising APPs/Residents/Students Critical care was necessary to treat or prevent imminent or life-threatening deterioration. This patient is critically ill and at significant risk for neurological worsening and/or death and care requires constant monitoring. Critical care was time spent personally by me on the following activities: development of treatment plan with patient and/or  surrogate as well as nursing, discussions with consultants, evaluation of patient's response to treatment, examination of patient, obtaining history from patient or surrogate, ordering and performing treatments and interventions, ordering and review of laboratory studies, ordering and review of radiographic studies, pulse oximetry, re-evaluation of patient's condition, participation in multidisciplinary rounds and medical decision making of high complexity in the care of this patient.

## 2023-04-26 NOTE — Progress Notes (Signed)
[facility]  Daily Progress Note   Subjective  -   Patient had word finding and aphasia earlier.  Neurology saw and CTA and MRI done which I have reviewed.  CTA shows a patent stent. MRI shows no new infarct  Objective Vitals:   04/26/23 1525 04/26/23 1545 04/26/23 1550 04/26/23 1647  BP: (!) 138/97 133/70 134/75   Pulse: (!) 34  (!) 33   Resp: 15 15 10    Temp:   98.6 F (37 C) 98.8 F (37.1 C)  TempSrc:   Oral Oral  SpO2: 99%  95%   Weight:      Height:       No intake or output data in the 24 hours ending 04/26/23 1728  PULM  CTAB CV  RRR VASC  Access site is C/D/I.  Laboratory CBC    Component Value Date/Time   WBC 6.8 03/04/2023 0843   HGB 14.4 03/04/2023 0843   HGB 14.6 11/28/2014 0000   HGB 14.6 11/28/2014 0000   HCT 43.0 03/04/2023 0843   PLT 185.0 03/04/2023 0843   PLT 158 11/28/2014 0000    BMET    Component Value Date/Time   NA 140 03/04/2023 0843   NA 141 11/28/2014 0000   NA 141 11/28/2014 0000   K 4.6 03/04/2023 0843   CL 102 03/04/2023 0843   CO2 28 03/04/2023 0843   GLUCOSE 124 (H) 03/04/2023 0843   BUN 15 04/26/2023 0914   BUN 12 11/28/2014 0000   CREATININE 0.90 04/26/2023 0914   CREATININE 1.06 09/02/2018 0948   CALCIUM 9.8 03/04/2023 0843   GFRNONAA >60 04/26/2023 0914   GFRAA >60 11/29/2017 1039    Assessment/Planning: POD #0 s/p left carotid stent for symptomatic carotid stenosis  Had some aphasia and word finding issues earlier CTA shows patent stent. MRI with no new infarct. Seems at baseline currently DAPT Keep BP >140 systolic   Shannon Chung  04/26/2023, 5:28 PM

## 2023-04-26 NOTE — Op Note (Signed)
OPERATIVE NOTE DATE: 04/26/2023  PROCEDURE:  Ultrasound guidance for vascular access right femoral artery  Placement of a 9 mm diameter proximal by 7 mm distal, 4 cm long exact stent with the use of the NAV-6 embolic protection device in the left carotid artery Placement of a 9 mm diameter by 7 mm distal 3 cm long extender due to lesion longer than the original stent  PRE-OPERATIVE DIAGNOSIS: 1.  Moderate, ulcerated left carotid artery stenosis. 2.  Left hemispheric stroke approximately 8 weeks ago 3.  Moderate to high-grade right carotid artery stenosis  POST-OPERATIVE DIAGNOSIS:  Same as above  SURGEON: Festus Barren, MD  ASSISTANT(S): None  ANESTHESIA: local/MCS  ESTIMATED BLOOD LOSS: 20 cc  CONTRAST: 65 cc  FLUORO TIME: 5 minutes  MODERATE CONSCIOUS SEDATION TIME:  Approximately 47 minutes using 1 mg of Versed and 50 mcg of Fentanyl  FINDING(S): 1.   Ulcerated lesion with approximately 60% left internal carotid artery stenosis  SPECIMEN(S):   none  INDICATIONS:   Patient is a 82 y.o. male who presents with a moderate approximately 60% ulcerated carotid artery stenosis.  The patient has significant coronary disease and carotid artery stenting was felt to be preferred to endarterectomy for that reason. I have completed Share Decision Making with [name] prior to surgery.  Conversations included: -Discussion of all treatment options including carotid endarterectomy (CEA), CAS (which includes transcarotid artery revascularization (TCAR)), and optimal medical therapy (OMT)). -Explanation of risks and benefits for each option specific to [employer] clinical situation. -Integration of clinical guidelines as it relates to the patient's history and co-morbidities -Discussion and incorporation of [name] and their personal preferences and priorities in choosing a treatment plan.  If patient was unable to participate in Shared Decision Making this process was  done with the patient.   Risks and benefits were discussed and informed consent was obtained.   DESCRIPTION: After obtaining full informed written consent, the patient was brought back to the vascular suite and placed supine upon the table.  The patient received IV antibiotics prior to induction. Moderate conscious sedation was administered during a face to face encounter with the patient throughout the procedure with my supervision of the RN administering medicines and monitoring the patients vital signs and mental status throughout from the start of the procedure until the patient was taken to the recovery room.  After obtaining adequate anesthesia, the patient was prepped and draped in the standard fashion.   The right femoral artery was visualized with ultrasound and found to be widely patent. It was then accessed under direct ultrasound guidance without difficulty with a Seldinger needle. A permanent image was recorded. A J-wire was placed and we then placed a 6 French sheath. The patient was then heparinized [place] [address]11 units of intravenous heparin were given and an ACT was checked to confirm successful anticoagulation. A pigtail catheter was then placed into the ascending aorta. This showed a type 2 aortic arch without proximal stenosis. I then selectively cannulated the left common carotid artery without difficulty with a headhunter catheter and advanced into the mid left common carotid artery.  Cervical and cerebral carotid angiography was then performed. There were no obvious intracranial filling defects with no cross filling. The carotid bifurcation demonstrated an ulcerated long lesion.  This was approximately 60% in the ICA.  I then advanced into the external carotid artery with a Glidewire and the headhunter catheter and then exchanged for the Amplatz Super Stiff wire. Over  the Amplatz Super Stiff wire, a 6 [place] shuttle sheath was placed into the mid common carotid artery. I then used  the NAV-6  Embolic protection device and crossed the lesion and parked this in the distal internal carotid artery at the base of the skull.  I then selected a 9 mm proximal, 7 mm distal Exact stent. This was deployed across the lesion, but was not long enough to encompass it in its entirety.  An additional 9 mm proximal by 7 mm distal 3 cm long stent was deployed extending about another centimeter to a centimeter and a half more distally.  A 5 mm x 4 cm length balloon was used to post dilate the stent. Only about a 10% residual stenosis was present after angioplasty. Completion angiogram showed normal intracranial filling without new defects. At this point I elected to terminate the procedure. The sheath was removed and StarClose closure device was deployed in the right femoral artery with excellent hemostatic result. The patient was taken to the recovery room in stable condition having tolerated the procedure well.  COMPLICATIONS: none  CONDITION: stable  Festus Barren 04/26/2023 11:56 AM   This note was created with Dragon Medical transcription system. Any errors in dictation are purely unintentional.

## 2023-04-26 NOTE — Interval H&P Note (Signed)
History and Physical Interval Note:  04/26/2023 9:16 AM  Shannon Chung  has presented today for surgery, with the diagnosis of L Carotid Stent    ABBOTT   Bilateral carotid stenosis.  The various methods of treatment have been discussed with the patient and family. After consideration of risks, benefits and other options for treatment, the patient has consented to  Procedure(s): CAROTID PTA/STENT INTERVENTION (Left) as a surgical intervention.  The patient's history has been reviewed, patient examined, no change in status, stable for surgery.  I have reviewed the patient's chart and labs.  Questions were answered to the patient's satisfaction.     Festus Barren

## 2023-04-26 NOTE — Plan of Care (Addendum)
Patient came to the floor after vascular proc about 1400.  Site assessment, and general assessment revealed WNL. Patient answered questions appropriately and showed no s/sx of complications.  Wife also at bedside during this time.  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  Staff left the room and informed me (patient's RN) that patient speech and word finding was not his normal.   I went directly to the room to discuss with patient.  The patient didn't seems to have problem with short answers - but try to have a discussion was obviously a struggle. When attempted, patient's speech became jarbbled and slurred.  Also, patient struggles with finding his words and or speaking was he was thinking. LKW 1430.  All vitals WNL.  Code stroke called.  NIH 3 (all speech related).  Neurology also assessed at bedside and agreed with assessment.  Taken to CT and MRI - wife all contacted.  Patient returned to the floor (wife at bedside again).  POC now to include Q1 NIH x4 then Q2 while in ICU.  Vitals Q1 x4 then Q2. Even 1st NIH after returning from scans showed improvement in word finding. 12 Lead EKG completed. Maintain systolic BP of 140-180 and use pressors if needed.  Passed swallow screen and gave diet.

## 2023-04-27 ENCOUNTER — Other Ambulatory Visit: Payer: Self-pay

## 2023-04-27 ENCOUNTER — Ambulatory Visit: Payer: Medicare Other | Admitting: Physician Assistant

## 2023-04-27 ENCOUNTER — Encounter (INDEPENDENT_AMBULATORY_CARE_PROVIDER_SITE_OTHER): Payer: Self-pay | Admitting: Vascular Surgery

## 2023-04-27 LAB — CBC
HCT: 42.1 % (ref 39.0–52.0)
Hemoglobin: 14.1 g/dL (ref 13.0–17.0)
MCH: 32.3 pg (ref 26.0–34.0)
MCHC: 33.5 g/dL (ref 30.0–36.0)
MCV: 96.3 fL (ref 80.0–100.0)
Platelets: 163 10*3/uL (ref 150–400)
RBC: 4.37 MIL/uL (ref 4.22–5.81)
RDW: 13.2 % (ref 11.5–15.5)
WBC: 6.9 10*3/uL (ref 4.0–10.5)
nRBC: 0 % (ref 0.0–0.2)

## 2023-04-27 LAB — BASIC METABOLIC PANEL
Anion gap: 9 (ref 5–15)
BUN: 10 mg/dL (ref 8–23)
CO2: 25 mmol/L (ref 22–32)
Calcium: 8.9 mg/dL (ref 8.9–10.3)
Chloride: 104 mmol/L (ref 98–111)
Creatinine, Ser: 0.88 mg/dL (ref 0.61–1.24)
GFR, Estimated: >60 mL/min (ref >60)
Glucose, Bld: 141 mg/dL — ABNORMAL HIGH (ref 70–99)
Potassium: 4 mmol/L (ref 3.5–5.1)
Sodium: 138 mmol/L (ref 135–145)

## 2023-04-27 MED ORDER — CHLORHEXIDINE GLUCONATE CLOTH 2 % EX PADS: 6.0000 | MEDICATED_PAD | Freq: Every day | CUTANEOUS | Status: DC

## 2023-04-27 NOTE — Discharge Summary (Signed)
[facility]    Discharge Summary    Patient ID:  Shannon Chung MRN: [id number] DOB/AGE: 80/16/1942 80 y.o.  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 06/2015 Cardiac CT: Ca score 1103 (84th %'ile);  b. 07/2015 Cath: LM 70, LAD 80p, 100/101m, D1 70, D2 95, RI 75, RCA 100p/m;  c. 07/2015 CABG x 5 (LIMA->LAD, VG->Diag, VG->OM1->OM2, VG->OM3).   COVID-19    12/2021   COVID-19 01/18/2022   COVID-19 vaccine administered 01/18/2022   Unknown how many vaccine doses have been received. Entered from Emergency Triage Note.   Diabetes mellitus without complication (HCC) 07/2015   Dyslipidemia    Essential hypertension    Essential hypertension 01/02/2015   Formatting of this note might be different from the original.  Last Assessment & Plan:   Chronic, stable. Continue current regimen.   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 dilated, EF 35% improved to 45-50% (2015);  b. 07/2015 EF 25-35% by LV gram.   Ischemic cardiomyopathy 01/02/2015   Kidney stones 04/17/2021   Lone atrial fibrillation (HCC) 1983   a. isolated episode, not on OAC.   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 06/2014: LV; resolved with coumadin-->no residual on f/u echo, no longer on coumadin.   Mural thrombus of heart 08/26/2021   Formatting of this note might be different from the original. Jun 16, 2019 Entered By: [name] Comment: left ventricle, cardiac apex   Osteoarthritis    a. 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 Jun 16, 2019 Entered By: [name] Comment: leftAug 19, 2020 Entered By: [name]  Comment: resolved by total left knee replacement Jun 16, 2019 Entered By: [name] Comment: leftAug 19, 2020 Entered By: [name] Comment: resolved by total left knee replacement   Trigger finger of left hand 07/07/2019   Vitamin D deficiency     Procedure(s): CAROTID PTA/STENT INTERVENTION  Discharged Condition: good  HPI:  Shannon Chung is a 82 yo male who is now S/P Endovascular Left Stent placement. Patient had some hypotension with aphasia immediately after procedure but all his symptoms this afternoon have resolved. Patient was seen by neurology and cardiology and will follow up as scheduled. Patient will also follow up with Vascular surgery in 1 month with carotid ultrasounds. No complaints today and Vitals all remain stable. Patient discharged on his home ASA of 162 mg daily and Plavix 75 mg daily.    Hospital Course:  Shannon Chung is a 82 y.o. male is S/P Left Extubated: POD # 0 Physical Exam:  Alert notes x3, no acute distress Face: Symmetrical.  Tongue is midline. Neck: Trachea is midline.  No swelling or bruising. Cardiovascular: Regular rate and rhythm Pulmonary: Clear to auscultation bilaterally Abdomen: Soft, nontender, nondistended Right groin access: Clean dry and intact.  No swelling or drainage noted Left lower extremity: Thigh soft.  Calf soft.  Extremities warm distally toes.  Hard to palpate pedal pulses however the foot is warm is her good capillary refill. Right lower extremity: Thigh soft.  Calf soft.  Extremities warm distally toes.  Hard to palpate pedal pulses however the foot is warm is her good capillary refill. Neurological: No deficits noted   Post-op wounds:  clean, dry, intact or healing well  Pt. Ambulating, voiding and taking PO diet without difficulty. Pt pain controlled with PO pain meds.  Labs:  As below  Complications: none  Consults:  Treatment Team:  Yvonne Kendall, MD  Significant Diagnostic  Studies: CBC Lab Results  Component Value Date   WBC 6.9 04/27/2023   HGB 14.1 04/27/2023   HCT 42.1 04/27/2023   MCV 96.3 04/27/2023   PLT 163 04/27/2023    BMET    Component Value Date/Time   NA 138 04/27/2023 0651   NA 141 11/28/2014 0000   NA 141 11/28/2014 0000   K 4.0 04/27/2023 0651   CL 104 04/27/2023 0651   CO2 25 04/27/2023 0651   GLUCOSE 141 (H) 04/27/2023 0651   BUN 10 04/27/2023 0651   BUN 12 11/28/2014 0000   CREATININE 0.88 04/27/2023 0651   CREATININE 1.06 09/02/2018 0948   CALCIUM 8.9 04/27/2023 0651   GFRNONAA >60 04/27/2023 0651   GFRAA >60 11/29/2017 1039   COAG Lab Results  Component Value Date   INR 1.1 02/22/2023   INR 1.0 02/28/2018   INR 1.01 03/04/2016     Disposition:  Discharge to :Home  Allergies as of 04/27/2023       Reactions   Bee Pollen Itching   Pollen Extract Itching  Medication List     TAKE these medications    acetaminophen 500 MG tablet Commonly known as: TYLENOL Take 500 mg by mouth every 6 (six) hours as needed for mild pain.   aspirin EC 81 MG tablet Take 162 mg by mouth daily. Swallow whole.   atorvastatin 40 MG tablet Commonly known as: LIPITOR Take 1 tablet (40 mg total) by mouth daily.   clopidogrel 75 MG tablet Commonly known as: Plavix Take 1 tablet (75 mg total) by mouth daily.   ezetimibe 10 MG tablet Commonly known as: ZETIA Take 1 tablet (10 mg total) by mouth daily.   famotidine 20 MG tablet Commonly known as: PEPCID TAKE 1 TABLET BY MOUTH 2 TIMES DAILY AS NEEDED FOR HEARTBURN/INDIGESTION. D/C ZANTAC   fluticasone 50 MCG/ACT nasal spray Commonly known as: FLONASE Place 1 spray into both nostrils daily as needed for allergies.   lansoprazole 30 MG capsule Commonly known as: PREVACID Take 1 capsule (30 mg total) by mouth every morning.   lisinopril 10 MG tablet Commonly known as: ZESTRIL TAKE 1 TABLET BY MOUTH  DAILY   metFORMIN 500 MG tablet Commonly known as:  GLUCOPHAGE Take 1 tablet (500 mg total) by mouth 2 (two) times daily with a meal. What changed:  how much to take when to take this   metoprolol succinate 25 MG 24 hr tablet Commonly known as: TOPROL-XL TAKE 1 TABLET BY MOUTH ONCE  DAILY   prednisoLONE acetate 1 % ophthalmic suspension Commonly known as: PRED FORTE Place 1 drop into both eyes daily.   sildenafil 20 MG tablet Commonly known as: REVATIO Take 1 tablet (20 mg total) by mouth daily as needed.   vitamin B-12 500 MCG tablet Commonly known as: CYANOCOBALAMIN Take 500 mcg by mouth daily.   Vitamin D-3 125 MCG (5000 UT) Tabs Take 5,000 Units by mouth daily.       Verbal and written Discharge instructions given to the patient. Wound care per Discharge AVS  Follow-up Information     Georgiana Spinner, NP Follow up in 1 month(s).   Specialty: Vascular Surgery Why: Bilateral carotid ultrasounds Contact information: [address] [place] [address] [phone]                 Signed: Marcie Bal, NP  04/27/2023, 2:05 PM

## 2023-04-27 NOTE — Progress Notes (Addendum)
Neurology Progress Note   S:// Seen and examined Back to baseline.   O:// Current vital signs: BP (!) 155/74   Pulse (!) 56   Temp 98.1 F (36.7 C) (Oral)   Resp 19   Ht 6' (1.829 m)   Wt 93 kg   SpO2 95%   BMI 27.80 kg/m  Vital signs in last 24 hours: Temp:  [97.6 F (36.4 C)-98.8 F (37.1 C)] 98.1 F (36.7 C) (06/25 0400) Pulse Rate:  [29-95] 56 (06/25 0915) Resp:  [9-29] 19 (06/25 0915) BP: (85-172)/(52-137) 155/74 (06/25 0915) SpO2:  [89 %-99 %] 95 % (06/25 0915) Neurological exam Awake alert oriented x 3 No dysarthria No aphasia Cranials 2-12 intact Motor: No drift Sensation intact light touch Coordination examination with no dysmetria And a stroke scale-0  Medications  Current Facility-Administered Medications:    0.9 %  sodium chloride infusion, , Intravenous, Continuous, Milon Dikes, MD, Last Rate: 100 mL/hr at 04/27/23 1000, New Bag at 04/27/23 1000   acetaminophen (TYLENOL) tablet 325-650 mg, 325-650 mg, Oral, Q4H PRN, 650 mg at 04/27/23 0010 **OR** acetaminophen (TYLENOL) suppository 325-650 mg, 325-650 mg, Rectal, Q4H PRN, Wyn Quaker, Marlow Baars, MD   acetaminophen (TYLENOL) tablet 500 mg, 500 mg, Oral, Q6H PRN, Wyn Quaker, Marlow Baars, MD   alum & mag hydroxide-simeth (MAALOX/MYLANTA) 200-200-20 MG/5ML suspension 15-30 mL, 15-30 mL, Oral, Q2H PRN, Wyn Quaker, Marlow Baars, MD   aspirin EC tablet 162 mg, 162 mg, Oral, Daily, Dew, Marlow Baars, MD, 162 mg at 04/27/23 1155   atorvastatin (LIPITOR) tablet 40 mg, 40 mg, Oral, QHS, Dew, Marlow Baars, MD   Chlorhexidine Gluconate Cloth 2 % PADS 6 each, 6 each, Topical, QHS, Dew, Marlow Baars, MD   cholecalciferol (VITAMIN D3) 25 MCG (1000 UNIT) tablet 5,000 Units, 5,000 Units, Oral, Daily, Dew, Marlow Baars, MD, 5,000 Units at 04/27/23 1155   clopidogrel (PLAVIX) tablet 75 mg, 75 mg, Oral, Daily, Dew, Marlow Baars, MD, 75 mg at 04/27/23 1159   cyanocobalamin (VITAMIN B12) tablet 500 mcg, 500 mcg, Oral, Daily, Dew, Marlow Baars, MD, 500 mcg at 04/27/23 1155   DOPamine  (INTROPIN) 800 mg in dextrose 5 % 250 mL (3.2 mg/mL) infusion, 0-20 mcg/kg/min, Intravenous, Titrated, Dew, Marlow Baars, MD, Last Rate: 8.72 mL/hr at 04/27/23 0626, 5 mcg/kg/min at 04/27/23 1610   ezetimibe (ZETIA) tablet 10 mg, 10 mg, Oral, Daily, Dew, Marlow Baars, MD, 10 mg at 04/27/23 1155   famotidine (PEPCID) IVPB 20 mg premix, 20 mg, Intravenous, Q12H, Dew, Marlow Baars, MD, Paused at 04/26/23 2302   fluticasone (FLONASE) 50 MCG/ACT nasal spray 1 spray, 1 spray, Each Nare, Daily PRN, Dew, Marlow Baars, MD   guaiFENesin-dextromethorphan (ROBITUSSIN DM) 100-10 MG/5ML syrup 15 mL, 15 mL, Oral, Q4H PRN, Dew, Marlow Baars, MD   hydrALAZINE (APRESOLINE) injection 5 mg, 5 mg, Intravenous, Q20 Min PRN, Dew, Marlow Baars, MD   HYDROmorphone (DILAUDID) injection 1 mg, 1 mg, Intravenous, Once PRN, Wyn Quaker, Marlow Baars, MD   labetalol (NORMODYNE) injection 10 mg, 10 mg, Intravenous, Q10 min PRN, Dew, Marlow Baars, MD   magnesium sulfate IVPB 2 g 50 mL, 2 g, Intravenous, Daily PRN, Annice Needy, MD   [START ON 04/29/2023] metFORMIN (GLUCOPHAGE) tablet 1,000 mg, 1,000 mg, Oral, Q supper, Dew, Marlow Baars, MD   metoprolol tartrate (LOPRESSOR) injection 2-5 mg, 2-5 mg, Intravenous, Q2H PRN, Dew, Marlow Baars, MD   morphine (PF) 4 MG/ML injection 2-5 mg, 2-5 mg, Intravenous, Q1H PRN, Wyn Quaker, Marlow Baars, MD   ondansetron [facility]) injection  4 mg, 4 mg, Intravenous, Q6H PRN, Annice Needy, MD   ondansetron (ZOFRAN) injection 4 mg, 4 mg, Intravenous, Q6H PRN, Annice Needy, MD   oxyCODONE-acetaminophen (PERCOCET/ROXICET) 5-325 MG per tablet 1-2 tablet, 1-2 tablet, Oral, Q4H PRN, Annice Needy, MD   pantoprazole (PROTONIX) EC tablet 20 mg, 20 mg, Oral, Daily, Dew, Marlow Baars, MD, 20 mg at 04/27/23 1212   phenol (CHLORASEPTIC) mouth spray 1 spray, 1 spray, Mouth/Throat, PRN, Annice Needy, MD   potassium chloride SA (KLOR-CON M) CR tablet 20-40 mEq, 20-40 mEq, Oral, Daily PRN, Annice Needy, MD   prednisoLONE acetate (PRED FORTE) 1 % ophthalmic suspension 1 drop, 1 drop, Both  Eyes, Daily, Annice Needy, MD Labs CBC    Component Value Date/Time   WBC 6.9 04/27/2023 0651   RBC 4.37 04/27/2023 0651   HGB 14.1 04/27/2023 0651   HGB 14.6 11/28/2014 0000   HGB 14.6 11/28/2014 0000   HCT 42.1 04/27/2023 0651   PLT 163 04/27/2023 0651   PLT 158 11/28/2014 0000   MCV 96.3 04/27/2023 0651   MCH 32.3 04/27/2023 0651   MCHC 33.5 04/27/2023 0651   RDW 13.2 04/27/2023 0651   LYMPHSABS 3.1 02/22/2023 1340   MONOABS 0.5 02/22/2023 1340   EOSABS 0.1 02/22/2023 1340   BASOSABS 0.1 02/22/2023 1340    CMP     Component Value Date/Time   NA 138 04/27/2023 0651   NA 141 11/28/2014 0000   NA 141 11/28/2014 0000   K 4.0 04/27/2023 0651   CL 104 04/27/2023 0651   CO2 25 04/27/2023 0651   GLUCOSE 141 (H) 04/27/2023 0651   BUN 10 04/27/2023 0651   BUN 12 11/28/2014 0000   CREATININE 0.88 04/27/2023 0651   CREATININE 1.06 09/02/2018 0948   CALCIUM 8.9 04/27/2023 0651   PROT 7.2 02/22/2023 1340   PROT 7.4 01/23/2016 0803   ALBUMIN 4.2 02/22/2023 1340   ALBUMIN 4.8 01/23/2016 0803   ALBUMIN 4.0 11/28/2014 0000   AST 24 02/22/2023 1340   AST 21 11/28/2014 0000   AST 21 11/28/2014 0000   ALT 12 02/22/2023 1340   ALT 27 11/28/2014 0000   ALT 27 11/28/2014 0000   ALKPHOS 47 02/22/2023 1340   ALKPHOS 53 11/28/2014 0000   ALKPHOS 53 11/28/2014 0000   BILITOT 0.6 02/22/2023 1340   BILITOT 0.6 01/23/2016 0803   BILITOT 0.5 11/28/2014 0000   BILITOT 0.5 11/28/2014 0000   GFRNONAA >60 04/27/2023 0651   GFRAA >60 11/29/2017 1039     Lipid Panel     Component Value Date/Time   CHOL 139 02/23/2023 0414   CHOL 123 01/23/2016 0803   CHOL 155 11/28/2014 0000   CHOL 155 11/28/2014 0000   TRIG 117 02/23/2023 0414   TRIG 126 11/28/2014 0000   TRIG 126 11/28/2014 0000   HDL 46 02/23/2023 0414   HDL 49 01/23/2016 0803   HDL 48 11/28/2014 0000   CHOLHDL 3.0 02/23/2023 0414   VLDL 23 02/23/2023 0414   LDLCALC 70 02/23/2023 0414   LDLCALC 51 01/23/2016 0803    LDLCALC 82 11/28/2014 0000   LDLCALC 82 11/28/2014 0000     Imaging No new imaging to review-imaging reviewed yesterday.  Telemetry concerning for A-fib.  Twelve-lead EKG ordered.  Appreciate cardiology review.  Assessment: 82 year old with multiple risk factors presented for left carotid revascularization status post left internal carotid stenting.  Aborted stroke/TIA status post TNK in April 2024 when he had garbled speech.  Yesterday after the procedure had mild aphasia NIH stroke scale of 2 that has since resolved. Was mildly hypotensive postprocedure as well, as well as sedation might of contributed to recrudescence of symptoms of left hemispheric dysfunction.  The only concerning thing is abnormal heart rhythm on telemetry.  He has undergone 30 days of cardiac monitoring and is supposed to see Dr. Mariah Milling.  Recommendations: Avoid hypotension Keep blood pressure goal of 140/90 rather than 120/80 as an outpatient. Will appreciate cardiology input on the rhythm strips/EKG because if he is in A-fib, he will need Eliquis in addition to 1 antiplatelet but if there is no A-fib, he will need dual antiplatelets for his stent. This was relayed to Dr. Wyn Quaker via secure chat. Neurology will be available as needed  Addendum Appreciate cardiology team reviewing strips and EKG-no evidence of A-fib. DAPT per vascular surgery Follow-up with outpatient neurology in 8 to 12 weeks. Vascular surgery follow-up per vascular surgery. Plan discussed with Dr. Wyn Quaker over secure chat  -- Milon Dikes, MD Neurologist [facility] Pager: [phone]

## 2023-04-27 NOTE — Progress Notes (Signed)
[facility]  Date: 04/27/23 Time: 1:23 PM  We were asked to review the patient's telemetry/EKG's due to concern for possible a-fib in the setting of strokes.  Telemetry shows sinus rhythm with PAC's, PVC's, and brief NSVT.  Yesterday's EKG is poor quality with significant artifact and limb lead reversal; however, it is most consistent with sinus rhythm with frequent PAC's.  Tracing today demonstrates normal sinus rhythm with PAC's and poor R wave progression.  I do not see any evidence of atrial fibrillation at this point.  Ongoing management of cerebrovascular disease per neurology and vascular surgery.  Yvonne Kendall, MD [facility]

## 2023-04-27 NOTE — Discharge Instructions (Signed)
You may shower tomorrow 04/28/2023. Shower with the old bandage on. Take off immediately after getting out of the shower. Pat very dry and place a band aid over the site. Repeat this for the next 3 days.   No driving for 2 weeks, drink plenty of water to stay hydrated. Ambulate at minimum by walking 3 times a day.   Do not lift anything heavy for the next two weeks. Nothing more than a gallon of milk.   Follow up to all scheduled appointments.

## 2023-04-28 ENCOUNTER — Emergency Department
Admission: EM | Admit: 2023-04-28 | Discharge: 2023-04-28 | Disposition: A | Discharge status: Home / Self Care | Payer: Medicare Other | Attending: Emergency Medicine | Admitting: Emergency Medicine

## 2023-04-28 ENCOUNTER — Other Ambulatory Visit: Payer: Self-pay

## 2023-04-28 ENCOUNTER — Ambulatory Visit: Payer: Medicare Other

## 2023-04-28 ENCOUNTER — Emergency Department: Payer: Medicare Other

## 2023-04-28 ENCOUNTER — Telehealth: Payer: Self-pay

## 2023-04-28 DIAGNOSIS — Z7902 Long term (current) use of antithrombotics/antiplatelets: Secondary | ICD-10-CM | POA: Diagnosis not present

## 2023-04-28 DIAGNOSIS — Z8616 Personal history of COVID-19: Secondary | ICD-10-CM | POA: Insufficient documentation

## 2023-04-28 DIAGNOSIS — Z85828 Personal history of other malignant neoplasm of skin: Secondary | ICD-10-CM | POA: Insufficient documentation

## 2023-04-28 DIAGNOSIS — Z7901 Long term (current) use of anticoagulants: Secondary | ICD-10-CM | POA: Insufficient documentation

## 2023-04-28 DIAGNOSIS — E119 Type 2 diabetes mellitus without complications: Secondary | ICD-10-CM | POA: Diagnosis not present

## 2023-04-28 DIAGNOSIS — Z8546 Personal history of malignant neoplasm of prostate: Secondary | ICD-10-CM | POA: Insufficient documentation

## 2023-04-28 DIAGNOSIS — R4701 Aphasia: Secondary | ICD-10-CM | POA: Diagnosis present

## 2023-04-28 DIAGNOSIS — I2510 Atherosclerotic heart disease of native coronary artery without angina pectoris: Secondary | ICD-10-CM | POA: Diagnosis not present

## 2023-04-28 DIAGNOSIS — G459 Transient cerebral ischemic attack, unspecified: Secondary | ICD-10-CM | POA: Diagnosis not present

## 2023-04-28 LAB — DIFFERENTIAL
Abs Immature Granulocytes: 0.01 10*3/uL (ref 0.00–0.07)
Basophils Absolute: 0.1 10*3/uL (ref 0.0–0.1)
Basophils Relative: 1 %
Eosinophils Absolute: 0.2 10*3/uL (ref 0.0–0.5)
Eosinophils Relative: 3 %
Immature Granulocytes: 0 %
Lymphocytes Relative: 36 %
Lymphs Abs: 2.5 10*3/uL (ref 0.7–4.0)
Monocytes Absolute: 0.5 10*3/uL (ref 0.1–1.0)
Monocytes Relative: 7 %
Neutro Abs: 3.8 10*3/uL (ref 1.7–7.7)
Neutrophils Relative %: 53 %

## 2023-04-28 LAB — PROTIME-INR
INR: 1 (ref 0.8–1.2)
Prothrombin Time: 13.9 seconds (ref 11.4–15.2)

## 2023-04-28 LAB — COMPREHENSIVE METABOLIC PANEL
ALT: 12 U/L (ref 0–44)
AST: 23 U/L (ref 15–41)
Albumin: 4.3 g/dL (ref 3.5–5.0)
Alkaline Phosphatase: 55 U/L (ref 38–126)
Anion gap: 10 (ref 5–15)
BUN: 15 mg/dL (ref 8–23)
CO2: 23 mmol/L (ref 22–32)
Calcium: 9.3 mg/dL (ref 8.9–10.3)
Chloride: 105 mmol/L (ref 98–111)
Creatinine, Ser: 0.97 mg/dL (ref 0.61–1.24)
GFR, Estimated: >60 mL/min (ref >60)
Glucose, Bld: 130 mg/dL — ABNORMAL HIGH (ref 70–99)
Potassium: 4 mmol/L (ref 3.5–5.1)
Sodium: 138 mmol/L (ref 135–145)
Total Bilirubin: 1 mg/dL (ref 0.3–1.2)
Total Protein: 7.3 g/dL (ref 6.5–8.1)

## 2023-04-28 LAB — CBC
HCT: 43.1 % (ref 39.0–52.0)
Hemoglobin: 14.3 g/dL (ref 13.0–17.0)
MCH: 32.3 pg (ref 26.0–34.0)
MCHC: 33.2 g/dL (ref 30.0–36.0)
MCV: 97.3 fL (ref 80.0–100.0)
Platelets: 172 10*3/uL (ref 150–400)
RBC: 4.43 MIL/uL (ref 4.22–5.81)
RDW: 13.4 % (ref 11.5–15.5)
WBC: 7.1 10*3/uL (ref 4.0–10.5)
nRBC: 0 % (ref 0.0–0.2)

## 2023-04-28 LAB — APTT: aPTT: 39 seconds — ABNORMAL HIGH (ref 24–36)

## 2023-04-28 LAB — ETHANOL: Alcohol, Ethyl (B): <10 mg/dL (ref <10)

## 2023-04-28 NOTE — ED Provider Triage Note (Signed)
Emergency Medicine Provider Triage Evaluation Note  Shannon Chung , a 82 y.o. male  was evaluated in triage.  Pt complains of slurred speech last night, see triage note , recent carotid surg, had same sx after surgery and ct /mri both done on monday.  Review of Systems  Positive:  Negative:   Physical Exam  BP 126/83 (BP Location: Right Arm)   Pulse 71   Temp 98.3 F (36.8 C) (Oral)   Resp 18   SpO2 93%  Gen:   Awake, no distress   Resp:  Normal effort  MSK:   Moves extremities without difficulty  Other:    Medical Decision Making  Medically screening exam initiated at 10:05 AM.  Appropriate orders placed.  Shannon Chung was informed that the remainder of the evaluation will be completed by another provider, this initial triage assessment does not replace that evaluation, and the importance of remaining in the ED until their evaluation is complete.  Stroke protocols initiated   Shannon Chung 04/28/23 1006

## 2023-04-28 NOTE — ED Triage Notes (Signed)
Pt had carotid surgery Monday morning, states after the surg pt started having slurred speech and was evaluated for stroke, ct and mri performed, pt was discharged at 1600 yesterday and experienced another episode that lasted for hours last pm of garbled speech, pt went to bed and woke up without any issues this am, pt denies any weakness or numbness and is speaking normally at this time

## 2023-04-28 NOTE — ED Provider Notes (Signed)
[facility] Provider Note    Event Date/Time   First MD Initiated Contact with Patient 04/28/23 1509     (approximate)   History   Aphasia   HPI  Shannon Chung is a 82 y.o. male past medical history of recent CVA and carotid stenting on the left who presents because of aphasia.  This current episode started around 7:30 PM last night.  Patient tells me he was trying to get his words out but he was unable to get them out.  Last until he went to bed.  When he woke up this morning he feels back to baseline.  Denies any associated limb numbness tingling weakness or vision change.  He is on the dual antiplatelets.  He is not measuring his blood pressure at home.  I reviewed Dr. Bess Harvest consult note from 6/24 at 3 PM.  Code stroke was called after patient came to the ICU after he developed aphasia.  NIH stroke scale was 2.  Given unclear last known normal did not receive TNK.  Was thought that the TIA symptoms were in the setting of likely hypoperfusion given his rotted stenosis.  MRI was obtained and this was negative.  Patient is currently on dual antiplatelet therapy aspirin 162 and Plavix 75 mg daily.    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 06/2015 Cardiac CT: Ca score 1103 (84th %'ile);  b. 07/2015 Cath: LM 70, LAD 80p, 100/48m, D1 70, D2 95, RI 75, RCA 100p/m;  c. 07/2015 CABG x 5 (LIMA->LAD, VG->Diag, VG->OM1->OM2, VG->OM3).   COVID-19    12/2021   COVID-19 01/18/2022   COVID-19 vaccine administered 01/18/2022   Unknown how many vaccine doses have been received. Entered from  Emergency Triage Note.   Diabetes mellitus without complication (HCC) 07/2015   Dyslipidemia    Essential hypertension    Essential hypertension 01/02/2015   Formatting of this note might be different from the original.  Last Assessment & Plan:   Chronic, stable. Continue current regimen.   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 dilated, EF 35% improved to 45-50% (2015);  b. 07/2015 EF 25-35% by LV gram.   Ischemic cardiomyopathy 01/02/2015   Kidney stones 04/17/2021   Lone atrial fibrillation (HCC)  1983   a. isolated episode, not on OAC.   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 06/2014: LV; resolved with coumadin-->no residual on f/u echo, no longer on coumadin.   Mural thrombus of heart 08/26/2021   Formatting of this note might be different from the original. Jun 16, 2019 Entered By: [name] Comment: left ventricle, cardiac apex   Osteoarthritis    a. 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 Jun 16, 2019 Entered By: [name] Comment: leftAug 19, 2020 Entered By: [name] Comment: resolved by total left knee replacement Jun 16, 2019 Entered By: [name] Comment: leftAug 19, 2020 Entered By: [name] Comment: resolved by total left knee replacement   Trigger finger of left hand 07/07/2019   Vitamin D deficiency     Patient Active Problem List   Diagnosis Date Noted   Carotid stenosis, symptomatic, with infarction (HCC) 04/26/2023   History of CVA (cerebrovascular accident) without residual deficits 03/04/2023   Acute CVA (cerebrovascular accident) (HCC) 02/22/2023   Chronic radicular pain of lower back 08/10/2022   Cervical spondylosis 03/11/2022   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   Bilateral carotid artery stenosis 03/11/2022   Lumbar spondylosis 10/07/2021   DDD (degenerative disc disease), lumbar 10/07/2021   History of radiation therapy 08/26/2021   Aortic atherosclerosis (HCC) 04/17/2021   Diverticulosis 04/17/2021   Hypertension associated with diabetes (HCC) 10/07/2020   Overweight (BMI 25.0-29.9) 10/07/2020   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   Vitamin D deficiency 08/01/2020   Polyp of sigmoid colon 08/01/2020   Insomnia 06/07/2020   Gastroesophageal reflux disease 04/04/2020   Degenerative joint disease of hand 03/01/2020   BPH (benign  prostatic hyperplasia) 08/05/2018   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   Macular scar of both eyes 04/20/2018   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   Scotoma involving central area of both eyes 04/20/2018   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   Fatty liver 03/25/2018   Erectile dysfunction 02/23/2017   Hx of CABG 01/24/2017   Advanced care planning/counseling discussion 10/21/2015   Coronary artery disease of native artery of native heart with stable angina pectoris (HCC)    DM2 (diabetes mellitus, type 2) (HCC) 08/12/2015   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 --      Excl. in GC? --     Most recent vital signs: Vitals:   04/28/23 1835 04/28/23 2336  BP: 99/84 120/76  Pulse: 71   Resp: 15   Temp: 98 F (36.7 C)   SpO2: 99%      General: Awake, no distress.  CV:  Good peripheral perfusion.  Resp:  Normal effort.  Abd:  No distention.  Neuro:             Awake, Alert, Oriented x 3  Other:  Aox3, nml speech  PERRL, EOMI, face symmetric, nml tongue movement  5/5 strength in the BL upper and lower extremities  Sensation grossly intact in the BL upper and lower extremities  Finger-nose-finger intact BL    ED Results / Procedures / Treatments  Labs (all labs ordered are listed, but only abnormal results are displayed) Labs Reviewed  APTT - Abnormal; Notable for the following components:      Result  Value   aPTT 39 (*)    All other components within normal limits  COMPREHENSIVE METABOLIC PANEL - Abnormal; Notable for the following components:   Glucose, Bld 130 (*)    All other components within normal limits  PROTIME-INR  CBC  DIFFERENTIAL  ETHANOL     EKG  EKG reviewed interpreted myself shows sinus rhythm normal axis intervals, questionable delta wave?  No acute ischemic changes     RADIOLOGY I reviewed and interpreted the CT scan of the brain which does not show any acute intracranial process    PROCEDURES:  Critical Care performed: No  Procedures   MEDICATIONS ORDERED IN ED: Medications - No data to display   IMPRESSION / MDM / ASSESSMENT AND PLAN / ED COURSE  I reviewed the triage vital signs and the nursing notes.                              Patient's presentation is most consistent with acute presentation with potential threat to life or bodily function.  Differential diagnosis includes, but is not limited to, left hemispheric TIA in the setting of recent carotid stenting, embolic TIA/CVA, bleed  82 year old male with recent carotid stenting who presents with slurred speech.  This started last night around 7:30 PM and lasted until he went to sleep.  Woke up with symptoms having resolved.  No other limb symptoms or vision complaint.  Patient's BP 144/72 vitals otherwise reassuring.  Neurologic exam is normal NIH of 0.  EKG shows sinus rhythm.  Labs are reassuring.  Noncon CT head is negative.  Discussed with on-call neurologist Dr. Wilford Corner who saw the patient last time he was here when stroke alert was called who recommends getting an MRI and if MRI is negative he can go home and continue the dual antiplatelet therapy.  MRI does not have any acute findings.  Patient continues to have no neurologic symptoms while in the ED.  Patient's blood  pressure is in the 120s.  I did recommend that he hold the lisinopril if blood pressures consistently less than 140 at  home as Dr. Wilford Corner did feel that his TIA was likely due to hypoperfusion.  Patient does not have a blood pressure cuff currently at home so I recommended that he get one so he can check his blood pressure periodically.  Did discuss with patient and his wife that if he has recurrent symptoms that are lasting more than several minutes that he should return to the ER.      FINAL CLINICAL IMPRESSION(S) / ED DIAGNOSES   Final diagnoses:  TIA (transient ischemic attack)     Rx / DC Orders   ED Discharge Orders     None        Note:  This document was prepared using Dragon voice recognition software and may include unintentional dictation errors.   Georga Hacking, MD 04/28/23 [phone]

## 2023-04-28 NOTE — Transitions of Care (Post Inpatient/ED Visit) (Signed)
04/28/2023  Name: Shannon Chung MRN: [id number] DOB: 1941-06-27  Today's TOC FU Call Status: Today's TOC FU Call Status:: Successful TOC FU Call Competed TOC FU Call Complete Date: 04/28/23  Transition Care Management Follow-up Telephone Call Date of Discharge: 04/27/23 Discharge Facility: [facility]) Type of Discharge: Inpatient Admission Primary Inpatient Discharge Diagnosis:: stenosis carotid How have you been since you were released from the hospital?: Same Any questions or concerns?: No (talked with dr Lajoyce Corners, he is sending him back to the ER. difficulty speaking)  Items Reviewed: Did you receive and understand the discharge instructions provided?: Yes Medications obtained,verified, and reconciled?: Yes (Medications Reviewed) Any new allergies since your discharge?: No Dietary orders reviewed?: Yes Do you have support at home?: Yes People in Home: significant other  Medications Reviewed Today: Medications Reviewed Today     Reviewed by Karena Addison, LPN (Licensed Practical Nurse) on 04/28/23 at [phone]  Med List Status: <None>   Medication Order Taking? Sig Documenting Provider Last Dose Status Informant  acetaminophen (TYLENOL) 500 MG tablet [id number] No Take 500 mg by mouth every 6 (six) hours as needed for mild pain.  [provider] Past Week Active Self  aspirin 81 MG EC tablet [id number] No Take 162 mg by mouth daily. Swallow whole. [provider] Past Week Active Self  atorvastatin (LIPITOR) 40 MG tablet [id number] No Take 1 tablet (40 mg total) by mouth daily. Sunnie Nielsen, DO 04/25/2023 Active   Cholecalciferol (VITAMIN D-3) 125 MCG (5000 UT) TABS [id number] No Take 5,000 Units by mouth daily. [provider] 04/25/2023 Active Self  clopidogrel (PLAVIX) 75 MG tablet [id number] No Take 1 tablet (75 mg total) by mouth daily. Sunnie Nielsen, DO 04/25/2023 Active   ezetimibe (ZETIA) 10 MG tablet [id number] No  Take 1 tablet (10 mg total) by mouth daily. Antonieta Iba, MD 04/25/2023 Active Self, Pharmacy Records  famotidine (PEPCID) 20 MG tablet [id number] No TAKE 1 TABLET BY MOUTH 2 TIMES DAILY AS NEEDED FOR HEARTBURN/INDIGESTION. D/C ZANTAC [name], Pasty Spillers, MD Past Month Active Self  fluticasone (FLONASE) 50 MCG/ACT nasal spray [id number] No Place 1 spray into both nostrils daily as needed for allergies. [provider] Taking Active Self  lansoprazole (PREVACID) 30 MG capsule [id number] No Take 1 capsule (30 mg total) by mouth every morning. Eustaquio Boyden, MD 04/25/2023 Active Self  lisinopril (ZESTRIL) 10 MG tablet [id number] No TAKE 1 TABLET BY MOUTH  DAILY Eulis Foster, FNP 04/25/2023 Active Self, Pharmacy Records  metFORMIN (GLUCOPHAGE) 500 MG tablet [id number] No Take 1 tablet (500 mg total) by mouth 2 (two) times daily with a meal.  Patient taking differently: Take 1,000 mg by mouth daily with supper.   Dana Allan, MD Past Week Active Self  metoprolol succinate (TOPROL-XL) 25 MG 24 hr tablet [id number] No TAKE 1 TABLET BY MOUTH ONCE  DAILY Antonieta Iba, MD 04/25/2023 Active   prednisoLONE acetate (PRED FORTE) 1 % ophthalmic suspension [id number] No Place 1 drop into both eyes daily. [provider] Past Month Active Self, Pharmacy Records  sildenafil (REVATIO) 20 MG tablet [id number] No Take 1 tablet (20 mg total) by mouth daily as needed. Dana Allan, MD Taking Active Self  vitamin B-12 (CYANOCOBALAMIN) 500 MCG tablet [id number] No Take 500 mcg by mouth daily. [provider] 04/25/2023 Active Self            Home Care and Equipment/Supplies: Were Home Health Services Ordered?: NA Any new equipment or medical supplies ordered?: NA  Functional Questionnaire: Do you need assistance with bathing/showering or dressing?: No Do you need assistance with meal preparation?: No Do you need assistance with eating?: No Do you have difficulty maintaining  continence: No Do you need assistance with getting out of bed/getting out of a chair/moving?: No Do you have difficulty managing or taking your medications?: No  Follow up appointments reviewed: PCP Follow-up appointment confirmed?: Yes Date of PCP follow-up appointment?: 05/12/23 Follow-up Provider: [facility] Follow-up appointment confirmed?: Yes Date of Specialist follow-up appointment?: 05/27/23 Follow-Up Specialty Provider:: vascular Do you need transportation to your follow-up appointment?: No Do you understand care options if your condition(s) worsen?: Yes-patient verbalized understanding    SIGNATURE Karena Addison, LPN [facility] Nurse Health Advisor Direct Dial [phone]

## 2023-04-28 NOTE — Telephone Encounter (Signed)
I contacted [name] back and gave her the recommendation from Sheppard Plumber NP to take the patient to the ED for evaluation now. Per [name] the patient is fine now and should she still go. I advised that it would benefit the patient to be evaluate. [name] stated she was taking the patient to the ED. I also advised that in future to please just call our office for something like this versus sending a Mychart message.

## 2023-04-28 NOTE — Telephone Encounter (Signed)
Please let them know go to the Emergency Room now

## 2023-04-28 NOTE — Discharge Instructions (Addendum)
Your MRI did not show any acute stroke.  Please start checking your blood pressure daily.  If it is consistently less than 140 you should hold your blood pressure medications.  Please follow-up with your cardiologist.  If you have recurrent episodes of slurred speech and please return to the emergency department immediately.

## 2023-04-29 ENCOUNTER — Telehealth: Payer: Self-pay

## 2023-04-29 NOTE — Transitions of Care (Post Inpatient/ED Visit) (Signed)
Unable to reach pt by phone and left v/m requesting cb [phone].     04/29/2023  Name: Shannon Chung MRN: [id number] DOB: 1941/02/08  Today's TOC FU Call Status: Today's TOC FU Call Status:: Unsuccessul Call (1st Attempt) Unsuccessful Call (1st Attempt) Date: 04/29/23  Attempted to reach the patient regarding the most recent Inpatient/ED visit.  Follow Up Plan: Additional outreach attempts will be made to reach the patient to complete the Transitions of Care (Post Inpatient/ED visit) call.   Signature Lewanda Rife, LPN

## 2023-04-30 NOTE — Transitions of Care (Post Inpatient/ED Visit) (Signed)
Pt seen at [facility] ED on 04/28/23; TIA; pt said 04/27/23 pts wife thought pts speech was slurred.on 04/28/23 pt felt good but pts wife wanted pt to go to ED just to be safe; pt said CT and MRI was OK and pt was d/c home.pt said he feels good now also. Pt does not want appt with PCP at this time; pt said already has appt with PCP for CPX on 06/23/23. Sending note to Dr Clent Ridges.    04/30/2023  Name: Shannon Chung MRN: [id number] DOB: 01-25-1941  Today's TOC FU Call Status: Today's TOC FU Call Status:: Successful TOC FU Call Competed Unsuccessful Call (1st Attempt) Date: 04/29/23 [facility] FU Call Complete Date: 04/30/23  Transition Care Management Follow-up Telephone Call Date of Discharge: 04/28/23 Discharge Facility: [facility]) Type of Discharge: Emergency Department Primary Inpatient Discharge Diagnosis:: TIA; pt said 04/27/23 pts wife thought pts speech was slurred.mon 04/28/23 pt felt gooid but pts wife wanted pt to go to ED just to be safe; pt said CT and MRI was OK and pt was d/c home.pt said he feels good now also. How have you been since you were released from the hospital?: Same Any questions or concerns?: No  Items Reviewed: Did you receive and understand the discharge instructions provided?: Yes Medications obtained,verified, and reconciled?: Partial Review Completed Reason for Partial Mediation Review: Pt said he got no new meds. pt seemed to being getting agitated with questions. Any new allergies since your discharge?: No Dietary orders reviewed?: NA Do you have support at home?: Yes People in Home: spouse Name of Support/Comfort Primary Source: [name]  Medications Reviewed Today: Medications Reviewed Today     Reviewed by Asencion Partridge, RN (Registered Nurse) on 04/28/23 at 1405  Med List Status: <None>   Medication Order Taking? Sig Documenting Provider Last Dose Status Informant  acetaminophen (TYLENOL) 500 MG tablet [id number]  Take 500 mg by  mouth every 6 (six) hours as needed for mild pain.  [provider]  Active Self  aspirin 81 MG EC tablet [id number]  Take 162 mg by mouth daily. Swallow whole. [provider]  Active Self  atorvastatin (LIPITOR) 40 MG tablet [id number]  Take 1 tablet (40 mg total) by mouth daily. Sunnie Nielsen, DO  Active   Cholecalciferol (VITAMIN D-3) 125 MCG (5000 UT) TABS [id number]  Take 5,000 Units by mouth daily. [provider]  Active Self  clopidogrel (PLAVIX) 75 MG tablet [id number]  Take 1 tablet (75 mg total) by mouth daily. Sunnie Nielsen, DO  Active   ezetimibe (ZETIA) 10 MG tablet [id number]  Take 1 tablet (10 mg total) by mouth daily. Antonieta Iba, MD  Active Self, Pharmacy Records  famotidine (PEPCID) 20 MG tablet [id number]  TAKE 1 TABLET BY MOUTH 2 TIMES DAILY AS NEEDED FOR HEARTBURN/INDIGESTION. D/C ZANTAC [name], Pasty Spillers, MD  Active Self  fluticasone (FLONASE) 50 MCG/ACT nasal spray [id number]  Place 1 spray into both nostrils daily as needed for allergies. [provider]  Active Self  lansoprazole (PREVACID) 30 MG capsule [id number]  Take 1 capsule (30 mg total) by mouth every morning. Eustaquio Boyden, MD  Active Self  lisinopril (ZESTRIL) 10 MG tablet [id number]  TAKE 1 TABLET BY MOUTH  DAILY Eulis Foster, FNP  Active Self, Pharmacy Records  metFORMIN (GLUCOPHAGE) 500 MG tablet [id number]  Take 1 tablet (500 mg total) by mouth 2 (two) times daily with a meal.  Patient taking differently: Take 1,000 mg by mouth daily  with supper.   Dana Allan, MD  Active Self  metoprolol succinate (TOPROL-XL) 25 MG 24 hr tablet [id number]  TAKE 1 TABLET BY MOUTH ONCE  DAILY Gollan, Tollie Pizza, MD  Active   prednisoLONE acetate (PRED FORTE) 1 % ophthalmic suspension [id number]  Place 1 drop into both eyes daily. [provider]  Active Self, Pharmacy Records  sildenafil (REVATIO) 20 MG tablet [id number]  Take 1 tablet (20 mg total) by mouth daily as  needed. Dana Allan, MD  Active Self  vitamin B-12 (CYANOCOBALAMIN) 500 MCG tablet [id number]  Take 500 mcg by mouth daily. [provider]  Active Self            Home Care and Equipment/Supplies: Were Home Health Services Ordered?: NA Any new equipment or medical supplies ordered?: NA  Functional Questionnaire: Do you need assistance with bathing/showering or dressing?: No Do you need assistance with meal preparation?: No Do you need assistance with eating?: No Do you have difficulty maintaining continence: No Do you need assistance with getting out of bed/getting out of a chair/moving?: No Do you have difficulty managing or taking your medications?: No  Follow up appointments reviewed: PCP Follow-up appointment confirmed?: No (pt said he does not need appt now; pt said already scheduled for CPX on 06/23/23. and if pt needs anything he knows contact info for [employer].) MD Provider Line [phone] Given: Yes Specialist Hospital Follow-up appointment confirmed?: NA Do you need transportation to your follow-up appointment?: No Do you understand care options if your condition(s) worsen?: Yes-patient verbalized understanding    SIGNATURE Lewanda Rife, LPN

## 2023-05-01 NOTE — Progress Notes (Deleted)
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 06/2015 Cardiac CT: Ca score 1103 (84th %'ile);  b. 07/2015 Cath: LM 70, LAD 80p, 100/25m, D1 70, D2 95, RI 75, RCA 100p/m;  c. 07/2015 CABG x 5 (LIMA->LAD, VG->Diag, VG->OM1->OM2, VG->OM3).   COVID-19    12/2021   COVID-19 01/18/2022   COVID-19 vaccine administered 01/18/2022   Unknown how many vaccine doses have been received. Entered from Emergency Triage Note.   Diabetes mellitus without complication (HCC) 07/2015   Dyslipidemia    Essential hypertension    Essential hypertension 01/02/2015   Formatting of this note might be different from the original.  Last Assessment & Plan:   Chronic, stable. Continue current regimen.   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 dilated, EF 35% improved to 45-50% (2015);  b. 07/2015 EF 25-35% by LV gram.   Ischemic cardiomyopathy 01/02/2015   Kidney stones 04/17/2021   Lone atrial fibrillation (HCC) 1983   a. isolated episode, not on OAC.   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 06/2014: LV; resolved with coumadin-->no residual on f/u echo, no longer on coumadin.   Mural thrombus of heart 08/26/2021   Formatting of this note might be different from the original. Jun 16, 2019 Entered By: [name] Comment: left ventricle, cardiac apex   Osteoarthritis    a. 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 Jun 16, 2019 Entered By: [name] Comment: leftAug 19, 2020  Entered By: [name] Comment: resolved by total left knee replacement Jun 16, 2019 Entered By: [name] Comment: leftAug 19, 2020 Entered By: [name] Comment: resolved by total left knee replacement   Trigger finger of left hand 07/07/2019   Vitamin D deficiency    Past Surgical History:  Procedure Laterality Date   BICEPS TENDON REPAIR Right 1993   CARDIAC CATHETERIZATION N/A 07/05/2015   Procedure: Left Heart Cath and Coronary Angiography;  Surgeon: Antonieta Iba, MD;  Location: [facility] INVASIVE CV LAB;  Service: Cardiovascular;  Laterality: N/A;   CAROTID PTA/STENT INTERVENTION Left 04/26/2023   Procedure: CAROTID PTA/STENT INTERVENTION;  Surgeon: Annice Needy, MD;  Location: [facility] INVASIVE CV LAB;  Service: Cardiovascular;  Laterality: Left;   CATARACT EXTRACTION Left 12/2016   with keratoplasty   COLONOSCOPY  2007   COLONOSCOPY WITH PROPOFOL N/A 12/02/2017   TA, SSA, rpt 3 yrs(Tahiliani, Varnita B, MD)   COLONOSCOPY WITH PROPOFOL N/A 11/26/2020   Procedure: COLONOSCOPY WITH PROPOFOL;  Surgeon: Midge Minium, MD;  Location: [facility] ENDOSCOPY;  Service: Endoscopy;  Laterality: N/A;   CORONARY ARTERY BYPASS GRAFT N/A 07/29/2015   Procedure: CORONARY ARTERY BYPASS GRAFTING (CABG) x 5 (LIMA to LAD, SVG to DIAGONAL,  SVG SEQUENTIALLY to OM1 and OM2, SVG to OM3) with Endoscopic Vein Havesting of  GREATER SAPHENOUS VEIN from RIGHT THIGH and partial LOWER LEG ;  Surgeon: Alleen Borne, MD;  Location: [facility] OR;  Service: Open Heart Surgery;  Laterality: N/A;   EYE SURGERY     b/l cataract and cornea replaced    HAND SURGERY     left hand 1st/2nd trigger fingers Dr. [facility] ortho    JOINT REPLACEMENT     KNEE ARTHROSCOPY Left remote   MOHS SURGERY     left cheek scc 2022 Dr. Jeannine Boga   MOHS SURGERY     x 5 facial scc   right biceps tendon     repair/re attachment    SKIN CANCER EXCISION  10/2015   BCC - L ala (pending MOHs) and L scapula (complete excision)   TEE  WITHOUT CARDIOVERSION N/A 07/29/2015   Procedure: TRANSESOPHAGEAL ECHOCARDIOGRAM (TEE);  Surgeon: Alleen Borne, MD;  Location[facility] OR;  Service: Open Heart Surgery;  Laterality: N/A;   TONSILLECTOMY  1949   TOTAL KNEE ARTHROPLASTY Left 03/18/2016   cemented L TKR; Deeann Saint, MD    Allergies  Allergies  Allergen Reactions   Bee Pollen Itching   Pollen Extract Itching     Labs/Other Studies Reviewed    The following studies were reviewed today: *** Cardiac Studies & Procedures   CARDIAC CATHETERIZATION  CARDIAC CATHETERIZATION 07/05/2015  Narrative  LM lesion, 70% stenosed.  Mid LAD-1 lesion, 100% stenosed.  Mid LAD-2 lesion, 90% stenosed.  2nd Diag lesion, 95% stenosed.  Prox LAD lesion, 80% stenosed.  Ramus lesion, 75% stenosed.  1st Diag lesion,  70% stenosed.  Lat 1st Diag lesion, 70% stenosed.  Prox RCA to Mid RCA lesion, 100% stenosed.  There is moderate left ventricular systolic dysfunction.  Findings Coronary Findings Diagnostic  Dominance: Left  Left Main  Left Anterior Descending The vessel is small .  chronic total occlusion .  First Physiological scientist  Second Diagonal Branch  Third Septal Branch Collaterals 3rd Sept filled by collaterals from LPAV.  Ramus Intermedius  Left Circumflex The vessel is small .  Second Obtuse Marginal Branch The vessel is small in size.  Left Posterior Atrioventricular Artery The vessel is small in size.  Right Coronary Artery The vessel is small . Collaterals Mid RCA filled by collaterals from Dist LAD.  chronic total occlusion .  Intervention  No interventions have been documented.     ECHOCARDIOGRAM  ECHOCARDIOGRAM COMPLETE 02/23/2023  Narrative ECHOCARDIOGRAM REPORT    Patient Name:   DAXTYN GLOWINSKI Date of Exam: 02/23/2023 Medical Rec #:  [id number]       Height:       72.0 in Accession #:    [id number]      Weight:       210.1 lb Date of Birth:   01-09-1941       BSA:          2.176 m Patient Age:    40 years        BP:           141/91 mmHg Patient Gender: M               HR:           61 bpm. Exam Location:  [facility]  Procedure: 2D Echo, Cardiac Doppler, Color Doppler and Intracardiac Opacification Agent  Indications:     Stroke I63.9  History:         Patient has prior history of Echocardiogram examinations, most recent 11/11/2015. Risk Factors:Hypertension and Diabetes. Heart attack.  Sonographer:     Cristela Blue Referring Phys:  [id number] Ezequiel Essex Diagnosing Phys: Yvonne Kendall MD   Sonographer Comments: Suboptimal apical window. IMPRESSIONS   1. Left ventricular ejection fraction, by estimation, is >55%. The left ventricle has normal function. Left ventricular endocardial border not optimally defined to evaluate regional wall motion. There is mild left ventricular hypertrophy. Left ventricular diastolic parameters are consistent with Grade I diastolic dysfunction (impaired relaxation). 2. Right ventricular systolic function was not well visualized. The right ventricular size is normal. Tricuspid regurgitation signal is inadequate for assessing PA pressure. 3. The mitral valve is grossly normal. No evidence of mitral valve regurgitation. No evidence of mitral stenosis. 4. The aortic valve is tricuspid. There is mild thickening of the aortic valve. Aortic valve regurgitation is not visualized. Aortic valve sclerosis is present, with no evidence of aortic valve stenosis.  FINDINGS Left Ventricle: Left ventricular ejection fraction, by estimation, is >55%. The left ventricle has normal function. Left ventricular endocardial border not optimally defined to evaluate regional wall motion. Definity contrast agent was given IV to delineate the left ventricular endocardial borders. The left ventricular internal cavity size was normal in size. There is mild left ventricular hypertrophy. Left ventricular diastolic parameters are  consistent with Grade I diastolic dysfunction (impaired relaxation).  Right Ventricle: The right ventricular size is normal. No increase in right ventricular wall thickness. Right ventricular systolic function was not well visualized. Tricuspid regurgitation signal is inadequate for assessing PA pressure.  Left Atrium: Left atrial size was  normal in size.  Right Atrium: Right atrial size was normal in size.  Pericardium: There is no evidence of pericardial effusion.  Mitral Valve: The mitral valve is grossly normal. No evidence of mitral valve regurgitation. No evidence of mitral valve stenosis. MV peak gradient, 5.0 mmHg. The mean mitral valve gradient is 1.0 mmHg.  Tricuspid Valve: The tricuspid valve is not well visualized. Tricuspid valve regurgitation is trivial.  Aortic Valve: The aortic valve is tricuspid. There is mild thickening of the aortic valve. Aortic valve regurgitation is not visualized. Aortic valve sclerosis is present, with no evidence of aortic valve stenosis. Aortic valve mean gradient measures 2.0 mmHg. Aortic valve peak gradient measures 3.0 mmHg. Aortic valve area, by VTI measures 3.44 cm.  Pulmonic Valve: The pulmonic valve was not well visualized. Pulmonic valve regurgitation is not visualized. No evidence of pulmonic stenosis.  Aorta: The aortic root is normal in size and structure.  Pulmonary Artery: The pulmonary artery is not well seen.  Venous: The inferior vena cava was not well visualized.  IAS/Shunts: The interatrial septum was not well visualized.   LEFT VENTRICLE PLAX 2D LVIDd:         5.30 cm   Diastology LVIDs:         3.20 cm   LV e' medial:    4.79 cm/s LV PW:         1.00 cm   LV E/e' medial:  14.2 LV IVS:        1.30 cm   LV e' lateral:   5.44 cm/s LVOT diam:     2.00 cm   LV E/e' lateral: 12.5 LV SV:         59 LV SV Index:   27 LVOT Area:     3.14 cm   LEFT ATRIUM             Index        RIGHT ATRIUM           Index LA diam:         2.70 cm 1.24 cm/m   RA Area:     13.00 cm LA Vol (A2C):   30.9 ml 14.20 ml/m  RA Volume:   24.50 ml  11.26 ml/m LA Vol (A4C):   37.0 ml 17.01 ml/m LA Biplane Vol: 36.0 ml 16.55 ml/m AORTIC VALVE AV Area (Vmax):    2.90 cm AV Area (Vmean):   2.67 cm AV Area (VTI):     3.44 cm AV Vmax:           86.50 cm/s AV Vmean:          59.100 cm/s AV VTI:            0.171 m AV Peak Grad:      3.0 mmHg AV Mean Grad:      2.0 mmHg LVOT Vmax:         79.80 cm/s LVOT Vmean:        50.300 cm/s LVOT VTI:          0.187 m LVOT/AV VTI ratio: 1.09  AORTA Ao Root diam: 3.60 cm  MITRAL VALVE MV Area (PHT): 3.11 cm     SHUNTS MV Area VTI:   2.07 cm     Systemic VTI:  0.19 m MV Peak grad:  5.0 mmHg     Systemic Diam: 2.00 cm MV Mean grad:  1.0 mmHg MV Vmax:       1.12 m/s MV Vmean:  54.4 cm/s MV Decel Time: 244 msec MV E velocity: 68.10 cm/s MV A velocity: 107.00 cm/s MV E/A ratio:  0.64  Christopher End MD Electronically signed by Yvonne Kendall MD Signature Date/Time: 02/23/2023/3:42:14 PM    Final    MONITORS  LONG TERM MONITOR-LIVE TELEMETRY (3-14 DAYS) 03/22/2023  Narrative Event monitor Patch Wear Time:  14 days and 0 hours (2024-04-23T16:01:59-0400 to 2024-05-07T16:01:59-0400)  Normal sinus rhythm Patient had a min HR of 44 bpm, max HR of 207 bpm, and avg HR of 64 bpm.  2 Ventricular Tachycardia runs occurred, the run with the fastest interval lasting 4 beats with a max rate of 207 bpm, the longest lasting 4 beats with an avg rate of 123 bpm.  13 Supraventricular Tachycardia runs occurred, the run with the fastest interval lasting 9 beats with a max rate of 169 bpm, the longest lasting 13 beats with an avg rate of 109 bpm.  Idioventricular Rhythm was present. Isolated SVEs were rare (<1.0%), SVE Couplets were rare (<1.0%), and SVE Triplets were rare (<1.0%). Isolated VEs were rare (<1.0%, 5271), VE Couplets were rare (<1.0%, 33), and VE Triplets were rare  (<1.0%, 1). Ventricular Bigeminy was present.  No patient triggered events recorded  Signed, Dossie Arbour, MD, Ph.D [facility]   CT SCANS  CT CARDIAC SCORING (SELF PAY ONLY) 06/26/2015  Addendum 06/26/2015  4:37 PM ADDENDUM REPORT: 06/26/2015 15:54  CLINICAL DATA:  Risk stratification  EXAM: Coronary Calcium Score  TECHNIQUE: The patient was scanned on a Siemens Sensation 16 slice scanner. Axial non-contrast 3 mm slices were carried out through the heart. The data set was analyzed on a dedicated work station and scored using the Agatson method.  FINDINGS: Non-cardiac: See separate report from [facility].  Ascending Aorta:  Normal caliber.  Pericardium: Normal  Coronary arteries:  Normal origin.  IMPRESSION: Coronary calcium score of 1103. This was 11 percentile for age and sex matched control.  This is a very high calcium score, almost all of the calcium located in the proximal to mid LAD.  Tobias Alexander   Electronically Signed By: Tobias Alexander On: 06/26/2015 15:54  Narrative EXAM: OVER-READ INTERPRETATION  CT CHEST  The following report is an over-read performed by radiologist Dr. Leatha Gilding [facility] on 06/26/2015. This over-read does not include interpretation of cardiac or coronary anatomy or pathology. The coronary calcium score/coronary CTA interpretation by the cardiologist is attached.  COMPARISON:  None.  FINDINGS: No visualized axillary lymphadenopathy. There is no lymphadenopathy within the visualized portions of the mediastinum. Calcified right lower lobe granuloma noted with associated calcified nodal tissue in the right hilum. 6 mm noncalcified right lower lobe nodule seen on image 24 series 4. No pulmonary edema or focal airspace consolidation. Compressive atelectasis noted in the dependent lungs bilaterally.  Bone windows reveal no worrisome lytic or sclerotic osseous lesions.  Visualized  portions of the upper abdomen are unremarkable.  IMPRESSION: 6 mm right lower lobe pulmonary nodule. If the patient is at high risk for bronchogenic carcinoma, follow-up chest CT at 6-12 months is recommended. If the patient is at low risk for bronchogenic carcinoma, follow-up chest CT at 12 months is recommended. This recommendation follows the consensus statement: Guidelines for Management of Small Pulmonary Nodules Detected on CT Scans: A Statement from the Fleischner Society as published in Radiology 2005;237:395-400.  Electronically Signed: By: Kennith Center M.D. On: 06/26/2015 14:03         Recent Labs: 04/28/2023: ALT 12; BUN 15;  Creatinine, Ser 0.97; Hemoglobin 14.3; Platelets 172; Potassium 4.0; Sodium 138  Recent Lipid Panel    Component Value Date/Time   CHOL 139 02/23/2023 0414   CHOL 123 01/23/2016 0803   CHOL 155 11/28/2014 0000   CHOL 155 11/28/2014 0000   TRIG 117 02/23/2023 0414   TRIG 126 11/28/2014 0000   TRIG 126 11/28/2014 0000   HDL 46 02/23/2023 0414   HDL 49 01/23/2016 0803   HDL 48 11/28/2014 0000   CHOLHDL 3.0 02/23/2023 0414   VLDL 23 02/23/2023 0414   LDLCALC 70 02/23/2023 0414   LDLCALC 51 01/23/2016 0803   LDLCALC 82 11/28/2014 0000   LDLCALC 82 11/28/2014 0000    History of Present Illness    82 year old male with past medical history of 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   Mr. Sebestyen was first evaluated by Dr. Mariah Milling in 01/2015 to establish care. Reported a history of ischemic cardiomyopathy with an EF of 35%, cardiac PET showing scar in the apical and periapical region, mural thrombus on anticoagulation in July 2015. Dr. Mariah Milling ordered a coronary CT which showed a calcium score of 1103 and most calcium was in the proximal to mid LAD. LHC in September  of 2016 showed severe multivessel disease and LM disease so he was referred to CT surgery. On 07/29/2015 he underwent CABGx5. 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 He was treated with TNK, MRI showed no acute infarct. CTA head and neck showed no emergent large vessel occlusion, approx 70% left and 60% right proximal ICA stenosis, severe nondominant/small right V2 vertebral artery stenosis in the mid neck (degenerative changes), moderate stenosis of the left paralinoid ICA, left intradural vertebral artery and left P2 PCA. Echo 02/23/23 indicated EF >55%, unable to assess wall motion, mild LV hypertrophy, G1DD, aortic valve sclerosis was present with no evidence of stenosis.   Following discharge he wore a cardiac monitor that indicated a min HR of 44bpm, max HR of 207bpm and avg HR of 64bpm. There were 2 ventricular tachycardia runs, fastest interval lasting 4 beats with max rate of 207bpm, longest lasting 4 beats with an avg rate of 123bpm. 13 supraventricular tachycardia runs occurred, run with fastest interval lasting 9 beats and longest lasting 13 beats with an avg rate of 109 bpm. There was no evidence of atrial fibrillation/flutter.   On 04/26/23 he underwent left carotid artery stenting by vascular surgery. Following the procedure he was hypotensive and he had word finding and aphasia, code stroke was called. MRI indicated no new infarct and CTA showed patent stent. Symptoms resolved by the following day, he was discharged on ASA 162mg  daily nad plavix 75mg  daily.     Stroke/Carotid artery stenosis:  02/22/23 presented for right sided weakness, treated with TNK. MRI showed no acute infarct.  CTA neck indicated approx 70% left and 60% right proximal ICA stenosis. Continue aspirin 81mg  daily, atorvastatin 40mg  daily,  clopidogrel 75mg  daily, ezetimibe 10mg  daily, lisinopril 10mg  daily, metoprolol succinate 25mg  daily   Atrial fibrillation Per patient one episode of atrial fibrillation 40 years ago.   CAD/ischemic CM: S/p CABG x5 in September 2016. Echo 02/23/23 indicated  EF >55%, unable to assess wall motion, mild LV hypertrophy, G1DD, aortic valve sclerosis was present with no evidence of stenosis.  Continue aspirin 81mg  daily, atorvastatin 40mg  daily, clopidogrel 75mg  daily, ezetimibe 10mg  daily, lisinopril 10mg  daily, metoprolol succinate 25mg  daily   HTN: BP today Continue lisinopril 10mg  daily and metoprolol succinate 25mg  daily   Hyperlipidemia:  Lipid panel on 02/23/23 indicated LDL of 70 and cholesterol of 139. Continue atorvastatin 40mg  daily.   Aortic atherosclerosis:  Noted on CT scan from 04/18/23. Continue with good blood pressure control and lipid management.   Home Medications    Current Outpatient Medications  Medication Sig Dispense Refill   acetaminophen (TYLENOL) 500 MG tablet Take 500 mg by mouth every 6 (six) hours as needed for mild pain.      aspirin 81 MG EC tablet Take 162 mg by mouth daily. Swallow whole.     atorvastatin (LIPITOR) 40 MG tablet Take 1 tablet (40 mg total) by mouth daily.     Cholecalciferol (VITAMIN D-3) 125 MCG (5000 UT) TABS Take 5,000 Units by mouth daily.     clopidogrel (PLAVIX) 75 MG tablet Take 1 tablet (75 mg total) by mouth daily. 30 tablet 0   ezetimibe (ZETIA) 10 MG tablet Take 1 tablet (10 mg total) by mouth daily. 90 tablet 3   famotidine (PEPCID) 20 MG tablet TAKE 1 TABLET BY MOUTH 2 TIMES DAILY AS NEEDED FOR HEARTBURN/INDIGESTION. D/C ZANTAC 180 tablet 3   fluticasone (FLONASE) 50 MCG/ACT nasal spray Place 1 spray into both nostrils daily as needed for allergies.     lansoprazole (PREVACID) 30 MG capsule Take 1 capsule (30 mg total) by mouth every morning. 90 capsule 3   lisinopril (ZESTRIL) 10 MG tablet TAKE 1 TABLET BY MOUTH  DAILY 90 tablet  3   metFORMIN (GLUCOPHAGE) 500 MG tablet Take 1 tablet (500 mg total) by mouth 2 (two) times daily with a meal. (Patient taking differently: Take 1,000 mg by mouth daily with supper.) 180 tablet 3   metoprolol succinate (TOPROL-XL) 25 MG 24 hr tablet TAKE 1 TABLET BY MOUTH ONCE  DAILY 90 tablet 2   prednisoLONE acetate (PRED FORTE) 1 % ophthalmic suspension Place 1 drop into both eyes daily.     sildenafil (REVATIO) 20 MG tablet Take 1 tablet (20 mg total) by mouth daily as needed. 30 tablet 0   vitamin B-12 (CYANOCOBALAMIN) 500 MCG tablet Take 500 mcg by mouth daily.     No current facility-administered medications for this visit.     Review of Systems    ***.  All other systems reviewed and are otherwise negative except as noted above.    Physical Exam    VS:  There were no vitals taken for this visit. , BMI There is no height or weight on file to calculate BMI.     GEN: Well nourished, well developed, in no acute distress. HEENT: normal. Neck: Supple, no JVD, carotid bruits, or masses. Cardiac: RRR, no murmurs, rubs, or gallops. No clubbing, cyanosis, edema.  Radials/DP/PT 2+ and equal bilaterally.  Respiratory:  Respirations regular and unlabored, clear to auscultation bilaterally. GI: Soft, nontender, nondistended, BS + x 4. MS: no deformity or atrophy. Skin: warm and dry, no rash. Neuro:  Strength and sensation are intact. Psych: Normal affect.  Accessory Clinical Findings    ECG personally reviewed by me today -    - no acute changes.   Lab Results  Component Value Date   WBC 7.1 04/28/2023  HGB 14.3 04/28/2023   HCT 43.1 04/28/2023   MCV 97.3 04/28/2023   PLT 172 04/28/2023   Lab Results  Component Value Date   CREATININE 0.97 04/28/2023   BUN 15 04/28/2023   NA 138 04/28/2023   K 4.0 04/28/2023   CL 105 04/28/2023   CO2 23 04/28/2023   Lab Results  Component Value Date   ALT 12 04/28/2023   AST 23 04/28/2023   ALKPHOS 55 04/28/2023   BILITOT 1.0  04/28/2023   Lab Results  Component Value Date   CHOL 139 02/23/2023   HDL 46 02/23/2023   LDLCALC 70 02/23/2023   TRIG 117 02/23/2023   CHOLHDL 3.0 02/23/2023    Lab Results  Component Value Date   HGBA1C 6.4 (H) 02/22/2023    Assessment & Plan    1.  ***  No BP recorded.  {Refresh Note OR Click here to enter BP  :1}***   Rip Harbour, NP 05/01/2023, 11:02 PM

## 2023-05-02 ENCOUNTER — Other Ambulatory Visit: Payer: Self-pay

## 2023-05-02 ENCOUNTER — Observation Stay: Payer: Medicare Other

## 2023-05-02 ENCOUNTER — Observation Stay
Admission: EM | Admit: 2023-05-02 | Discharge: 2023-05-03 | Disposition: A | Discharge status: Home / Self Care | Payer: Medicare Other | Attending: Internal Medicine | Admitting: Internal Medicine

## 2023-05-02 ENCOUNTER — Emergency Department: Payer: Medicare Other

## 2023-05-02 DIAGNOSIS — Z79899 Other long term (current) drug therapy: Secondary | ICD-10-CM | POA: Insufficient documentation

## 2023-05-02 DIAGNOSIS — I2510 Atherosclerotic heart disease of native coronary artery without angina pectoris: Secondary | ICD-10-CM | POA: Diagnosis not present

## 2023-05-02 DIAGNOSIS — Z96652 Presence of left artificial knee joint: Secondary | ICD-10-CM | POA: Insufficient documentation

## 2023-05-02 DIAGNOSIS — Z7984 Long term (current) use of oral hypoglycemic drugs: Secondary | ICD-10-CM | POA: Diagnosis not present

## 2023-05-02 DIAGNOSIS — Z87891 Personal history of nicotine dependence: Secondary | ICD-10-CM | POA: Insufficient documentation

## 2023-05-02 DIAGNOSIS — E785 Hyperlipidemia, unspecified: Secondary | ICD-10-CM | POA: Diagnosis present

## 2023-05-02 DIAGNOSIS — I10 Essential (primary) hypertension: Secondary | ICD-10-CM | POA: Insufficient documentation

## 2023-05-02 DIAGNOSIS — I4891 Unspecified atrial fibrillation: Secondary | ICD-10-CM | POA: Insufficient documentation

## 2023-05-02 DIAGNOSIS — E119 Type 2 diabetes mellitus without complications: Secondary | ICD-10-CM | POA: Insufficient documentation

## 2023-05-02 DIAGNOSIS — G459 Transient cerebral ischemic attack, unspecified: Principal | ICD-10-CM

## 2023-05-02 DIAGNOSIS — Z85828 Personal history of other malignant neoplasm of skin: Secondary | ICD-10-CM | POA: Diagnosis not present

## 2023-05-02 DIAGNOSIS — Z8546 Personal history of malignant neoplasm of prostate: Secondary | ICD-10-CM | POA: Diagnosis not present

## 2023-05-02 DIAGNOSIS — Z7902 Long term (current) use of antithrombotics/antiplatelets: Secondary | ICD-10-CM | POA: Diagnosis not present

## 2023-05-02 DIAGNOSIS — Z951 Presence of aortocoronary bypass graft: Secondary | ICD-10-CM | POA: Insufficient documentation

## 2023-05-02 DIAGNOSIS — R479 Unspecified speech disturbances: Secondary | ICD-10-CM

## 2023-05-02 DIAGNOSIS — Z8616 Personal history of COVID-19: Secondary | ICD-10-CM | POA: Diagnosis not present

## 2023-05-02 DIAGNOSIS — K219 Gastro-esophageal reflux disease without esophagitis: Secondary | ICD-10-CM

## 2023-05-02 DIAGNOSIS — Z7982 Long term (current) use of aspirin: Secondary | ICD-10-CM | POA: Diagnosis not present

## 2023-05-02 DIAGNOSIS — R4701 Aphasia: Secondary | ICD-10-CM | POA: Diagnosis present

## 2023-05-02 LAB — CBC
HCT: 40.1 % (ref 39.0–52.0)
Hemoglobin: 13.2 g/dL (ref 13.0–17.0)
MCH: 32.6 pg (ref 26.0–34.0)
MCHC: 32.9 g/dL (ref 30.0–36.0)
MCV: 99 fL (ref 80.0–100.0)
Platelets: 159 10*3/uL (ref 150–400)
RBC: 4.05 MIL/uL — ABNORMAL LOW (ref 4.22–5.81)
RDW: 13.5 % (ref 11.5–15.5)
WBC: 7 10*3/uL (ref 4.0–10.5)
nRBC: 0.3 % — ABNORMAL HIGH (ref 0.0–0.2)

## 2023-05-02 LAB — DIFFERENTIAL
Abs Immature Granulocytes: 0.02 10*3/uL (ref 0.00–0.07)
Basophils Absolute: 0.1 10*3/uL (ref 0.0–0.1)
Basophils Relative: 1 %
Eosinophils Absolute: 0.2 10*3/uL (ref 0.0–0.5)
Eosinophils Relative: 3 %
Immature Granulocytes: 0 %
Lymphocytes Relative: 48 %
Lymphs Abs: 3.4 10*3/uL (ref 0.7–4.0)
Monocytes Absolute: 0.6 10*3/uL (ref 0.1–1.0)
Monocytes Relative: 8 %
Neutro Abs: 2.8 10*3/uL (ref 1.7–7.7)
Neutrophils Relative %: 40 %

## 2023-05-02 LAB — COMPREHENSIVE METABOLIC PANEL
ALT: 15 U/L (ref 0–44)
AST: 23 U/L (ref 15–41)
Albumin: 4.3 g/dL (ref 3.5–5.0)
Alkaline Phosphatase: 51 U/L (ref 38–126)
Anion gap: 9 (ref 5–15)
BUN: 21 mg/dL (ref 8–23)
CO2: 26 mmol/L (ref 22–32)
Calcium: 9.2 mg/dL (ref 8.9–10.3)
Chloride: 102 mmol/L (ref 98–111)
Creatinine, Ser: 1.01 mg/dL (ref 0.61–1.24)
GFR, Estimated: >60 mL/min (ref >60)
Glucose, Bld: 95 mg/dL (ref 70–99)
Potassium: 4 mmol/L (ref 3.5–5.1)
Sodium: 137 mmol/L (ref 135–145)
Total Bilirubin: 0.6 mg/dL (ref 0.3–1.2)
Total Protein: 7.4 g/dL (ref 6.5–8.1)

## 2023-05-02 LAB — PROTIME-INR
INR: 1 (ref 0.8–1.2)
Prothrombin Time: 13.4 seconds (ref 11.4–15.2)

## 2023-05-02 LAB — APTT: aPTT: 21 seconds — ABNORMAL LOW (ref 24–36)

## 2023-05-02 LAB — GLUCOSE, CAPILLARY: Glucose-Capillary: 112 mg/dL — ABNORMAL HIGH (ref 70–99)

## 2023-05-02 MED ORDER — ASPIRIN 81 MG PO TBEC
162.0000 mg | DELAYED_RELEASE_TABLET | Freq: Every day | ORAL | Status: DC
  Administered 2023-05-03: 162 mg via ORAL
  Filled 2023-05-02: qty 2

## 2023-05-02 MED ORDER — METOPROLOL SUCCINATE ER 25 MG PO TB24
25.0000 mg | ORAL_TABLET | Freq: Every day | ORAL | Status: DC
  Filled 2023-05-02: qty 1

## 2023-05-02 MED ORDER — VITAMIN B-12 1000 MCG PO TABS
500.0000 ug | ORAL_TABLET | Freq: Every day | ORAL | Status: DC
  Administered 2023-05-03: 500 ug via ORAL
  Filled 2023-05-02: qty 1

## 2023-05-02 MED ORDER — INSULIN ASPART 100 UNIT/ML IJ SOLN: 0.0000 [IU] | Freq: Three times a day (TID) | INTRAMUSCULAR | Status: DC

## 2023-05-02 MED ORDER — SODIUM CHLORIDE 0.9 % IV SOLN: INTRAVENOUS | Status: DC

## 2023-05-02 MED ORDER — INSULIN ASPART 100 UNIT/ML IJ SOLN: 0.0000 [IU] | Freq: Every day | INTRAMUSCULAR | Status: DC

## 2023-05-02 MED ORDER — ENOXAPARIN SODIUM 40 MG/0.4ML IJ SOSY
40.0000 mg | PREFILLED_SYRINGE | INTRAMUSCULAR | Status: DC
  Administered 2023-05-02: 40 mg via SUBCUTANEOUS
  Filled 2023-05-02: qty 0.4

## 2023-05-02 MED ORDER — ACETAMINOPHEN 650 MG RE SUPP: 650.0000 mg | RECTAL | Status: DC | PRN

## 2023-05-02 MED ORDER — FAMOTIDINE 20 MG PO TABS
20.0000 mg | ORAL_TABLET | Freq: Two times a day (BID) | ORAL | Status: DC
  Administered 2023-05-03: 20 mg via ORAL
  Filled 2023-05-02: qty 1

## 2023-05-02 MED ORDER — SENNOSIDES-DOCUSATE SODIUM 8.6-50 MG PO TABS: 1.0000 | ORAL_TABLET | Freq: Every evening | ORAL | Status: DC | PRN

## 2023-05-02 MED ORDER — TRAZODONE HCL 50 MG PO TABS: 25.0000 mg | ORAL_TABLET | Freq: Every evening | ORAL | Status: DC | PRN

## 2023-05-02 MED ORDER — ACETAMINOPHEN 160 MG/5ML PO SOLN: 650.0000 mg | ORAL | Status: DC | PRN

## 2023-05-02 MED ORDER — CLOPIDOGREL BISULFATE 75 MG PO TABS
75.0000 mg | ORAL_TABLET | Freq: Every day | ORAL | Status: DC
  Administered 2023-05-03: 75 mg via ORAL
  Filled 2023-05-02: qty 1

## 2023-05-02 MED ORDER — ATORVASTATIN CALCIUM 20 MG PO TABS
40.0000 mg | ORAL_TABLET | Freq: Every day | ORAL | Status: DC
  Administered 2023-05-03: 40 mg via ORAL
  Filled 2023-05-02: qty 2

## 2023-05-02 MED ORDER — ACETAMINOPHEN 325 MG PO TABS: 650.0000 mg | ORAL_TABLET | ORAL | Status: DC | PRN

## 2023-05-02 MED ORDER — ONDANSETRON HCL 4 MG/2ML IJ SOLN: 4.0000 mg | INTRAMUSCULAR | Status: DC | PRN

## 2023-05-02 MED ORDER — STROKE: EARLY STAGES OF RECOVERY BOOK: Freq: Once | Status: AC

## 2023-05-02 MED ORDER — EZETIMIBE 10 MG PO TABS
10.0000 mg | ORAL_TABLET | Freq: Every day | ORAL | Status: DC
  Administered 2023-05-03: 10 mg via ORAL
  Filled 2023-05-02: qty 1

## 2023-05-02 MED ORDER — VITAMIN D 25 MCG (1000 UNIT) PO TABS
5000.0000 [IU] | ORAL_TABLET | Freq: Every day | ORAL | Status: DC
  Administered 2023-05-03: 5000 [IU] via ORAL
  Filled 2023-05-02: qty 5

## 2023-05-02 MED ORDER — LISINOPRIL 10 MG PO TABS
10.0000 mg | ORAL_TABLET | Freq: Every day | ORAL | Status: DC
  Administered 2023-05-03: 10 mg via ORAL
  Filled 2023-05-02: qty 1

## 2023-05-02 MED ORDER — PREDNISOLONE ACETATE 1 % OP SUSP
1.0000 [drp] | Freq: Every day | OPHTHALMIC | Status: DC
  Administered 2023-05-03: 1 [drp] via OPHTHALMIC
  Filled 2023-05-02: qty 1

## 2023-05-02 MED ORDER — FLUTICASONE PROPIONATE 50 MCG/ACT NA SUSP: 1.0000 | Freq: Every day | NASAL | Status: DC | PRN

## 2023-05-02 NOTE — Assessment & Plan Note (Addendum)
-   We will continue statin therapy and Zetia. - We will check fasting lipids.

## 2023-05-02 NOTE — Assessment & Plan Note (Signed)
-   We will continue his antihypertensives with permissive parameters. 

## 2023-05-02 NOTE — Assessment & Plan Note (Addendum)
-   This has been recurrent lately with recurrent expressive dysphasia. - He will be admitted to an observation medical telemetry bed. - We will follow neurochecks every 4 hours for 24 hours. - We will continue his aspirin and Plavix. - We will continue statin therapy and check fasting lipids. - Brain MRI without contrast is currently pending. --2D echo on fourth/23/24 revealed an EF more than 55% and grade 1 diastolic dysfunction.  I will not repeat echo at this time.  Will defer to neurology. - I will also hold off on CTA of the head and neck and deferred to neurology. - PT/OT and ST consults will be obtained. - Neurology consult will be obtained and I notified Dr. Iver Nestle about the patient.

## 2023-05-02 NOTE — ED Triage Notes (Signed)
Pt BIB [facility] from home with c/o of slurred speech around 1900, lasted for about 5 mins. Hx of TIA x 2 months ago, carotid stent placed on Monday. Pt is AAOx4 with clear speech. No neurological deficits noted.

## 2023-05-02 NOTE — Assessment & Plan Note (Signed)
-   We will continue PPI therapy 

## 2023-05-02 NOTE — ED Provider Notes (Signed)
[facility] Provider Note    Event Date/Time   First MD Initiated Contact with Patient 05/02/23 2000     (approximate)   History   Chief Complaint Aphasia   HPI  Javanni Calderon is a 82 y.o. male with past medical history of hypertension, hyperlipidemia, diabetes, CAD status post CABG, and stroke status post carotid stent who presents to the ED complaining of aphasia.  Patient reports that about 30 minutes prior to arrival he was sitting on his couch when he suddenly noticed that he was having a hard time getting his words out.  He states that this lasted for about 5 minutes before resolving.  He denies any associated vision changes, numbness, or weakness in his extremities.  He now states that he feels back to normal, currently denies any complaints.  He does state that he had a similar episode 1 week ago after undergoing carotid stent placement a few days prior to that.  He reports being compliant with aspirin and Plavix regimen.     Physical Exam   Triage Vital Signs: ED Triage Vitals  Enc Vitals Group     BP      Pulse      Resp      Temp      Temp src      SpO2      Weight      Height      Head Circumference      Peak Flow      Pain Score      Pain Loc      Pain Edu?      Excl. in GC?     Most recent vital signs: Vitals:   05/02/23 2013 05/02/23 2014  BP:  118/83  Pulse:  (!) 57  Resp:  17  Temp:  97.9 F (36.6 C)  SpO2: 99% 95%    Constitutional: Alert and oriented. Eyes: Conjunctivae are normal. Head: Atraumatic. Nose: No congestion/rhinnorhea. Mouth/Throat: Mucous membranes are moist.  Cardiovascular: Normal rate, regular rhythm. Grossly normal heart sounds.  2+ radial pulses bilaterally. Respiratory: Normal respiratory effort.  No retractions. Lungs CTAB. Gastrointestinal: Soft and nontender. No distention. Musculoskeletal: No lower extremity tenderness nor edema.  Neurologic:  Normal speech and language. No gross focal  neurologic deficits are appreciated.    ED Results / Procedures / Treatments   Labs (all labs ordered are listed, but only abnormal results are displayed) Labs Reviewed  APTT - Abnormal; Notable for the following components:      Result Value   aPTT 21 (*)    All other components within normal limits  CBC - Abnormal; Notable for the following components:   RBC 4.05 (*)    nRBC 0.3 (*)    All other components within normal limits  PROTIME-INR  DIFFERENTIAL  COMPREHENSIVE METABOLIC PANEL  URINALYSIS, ROUTINE W REFLEX MICROSCOPIC     EKG  ED ECG REPORT I, Chesley Noon, the attending physician, personally viewed and interpreted this ECG.   Date: 05/02/2023  EKG Time: 20:05  Rate: 84  Rhythm: normal sinus rhythm, ventricular bigeminy  Axis: Normal  Intervals:none  ST&T Change: None  RADIOLOGY CT head reviewed and interpreted by me with no hemorrhage or midline shift.  PROCEDURES:  Critical Care performed: No  Procedures   MEDICATIONS ORDERED IN ED: Medications - No data to display   IMPRESSION / MDM / ASSESSMENT AND PLAN / ED COURSE  I reviewed the triage vital signs and the  nursing notes.                              82 y.o. male with past medical history of hypertension, hyperlipidemia, diabetes, CAD status post CABG, and stroke status post carotid stent who presents to the ED complaining of aphasia 30 minutes prior to arrival that resolved after about 5 minutes.  Patient's presentation is most consistent with acute presentation with potential threat to life or bodily function.  Differential diagnosis includes, but is not limited to, stroke, TIA, arrhythmia, anemia, electrolyte abnormality.  Patient nontoxic-appearing and in no acute distress, vital signs are unremarkable.  He does not have any focal neurologic deficits on my exam, states that he feels like aphasia has resolved.  Presentation consistent with TIA, will further assess with CT imaging of his  head and labs.  CT head is negative for acute process.  Labs are reassuring with no significant anemia, leukocytosis, lecture abnormality, or AKI.  LFTs are also unremarkable.  Patient with no recurrence of aphasia or other neurologic symptoms.  Given multiple recent TIAs, case discussed with hospitalist for admission.      FINAL CLINICAL IMPRESSION(S) / ED DIAGNOSES   Final diagnoses:  TIA (transient ischemic attack)     Rx / DC Orders   ED Discharge Orders     None        Note:  This document was prepared using Dragon voice recognition software and may include unintentional dictation errors.   Chesley Noon, MD 05/02/23 2127

## 2023-05-02 NOTE — H&P (Addendum)
[place]   PATIENT NAME: Shannon Chung    MR#:  [id number]  DATE OF BIRTH:  September 30, 1941  DATE OF ADMISSION:  05/02/2023  PRIMARY CARE PHYSICIAN: Dana Allan, MD   Patient is coming from: Home  REQUESTING/REFERRING PHYSICIAN: Chesley Noon, MD  CHIEF COMPLAINT:   Chief Complaint  Patient presents with   Aphasia    HISTORY OF PRESENT ILLNESS:  Shannon Chung is a 82 y.o. Caucasian male with medical history significant for history of CVA in April 2024 s/p left carotid artery stent by a week ago, anxiety, depression, coronary artery disease s/p CABG X5, dyslipidemia, hypertension, GERD, who presented to emergency room with acute onset of expressive dysphasia that lasted about 10 minutes per his wife today.  This has happened about 4 more times this week.  He was seen for it on Wednesday here and workup was negative.  His wife stated that she was told to come if he has any recurrence.  He has been compliant with his aspirin and Plavix.  No paresthesias or focal muscle weakness.  No dysphagia.  No urinary or stool incontinence.  No tinnitus or vertigo.  No fever or chills.  No chest pain or palpitations.  No cough or wheezing or dyspnea.  No dysuria, oliguria or hematuria or flank pain.  The patient had diarrhea yesterday with loose bowel movements that resolved today.  No fever or chills.  No nausea or vomiting or abdominal pain.  ED Course: When he came to the ER, heart rate was 57 with otherwise normal vital signs.  Labs revealed unremarkable CMP and CBC.  INR was 1 with PT 13.4 and PTT 21. EKG as reviewed by me : EKG showed sinus rhythm with a rate of 84 with ventricular bigeminy and probable left atrial enlargement Imaging: Today noncontrasted CT scan revealed no acute intracranial normalities.  It showed remote infarct of the left parietal lobe and stable atrophy with white matter disease likely sequelae of chronic microvascular ischemia.  Most recent CTA of the head and  neck with and without contrast on 04/26/2023 revealed the following: 1. Interval placement of a stent in the distal left common and proximal left internal carotid artery, with beam hardening artifact limiting evaluation for in-stent thrombus; however, as contrast is seen at the proximal and distal ends, it is presumed to be patent. 2. 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 3. Severe narrowing in the right V2, mild narrowing in the left V2, and moderate stenosis in the left supraclinoid ICA and left P2 segment. 4. No intracranial large vessel occlusion. 5. Aortic atherosclerosis.  Brain MRI without contrast was ordered and is pending.  The patient will be admitted to an observation medical telemetry bed for further evaluation and management. 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 06/2015 Cardiac CT: Ca score 1103 (84th %'ile);  b. 07/2015 Cath: LM 70, LAD 80p, 100/6m, D1 70, D2 95, RI 75, RCA 100p/m;  c. 07/2015 CABG x 5 (LIMA->LAD, VG->Diag, VG->OM1->OM2, VG->OM3).   COVID-19    12/2021  COVID-19 01/18/2022   COVID-19 vaccine administered 01/18/2022   Unknown how many vaccine doses have been received. Entered from Emergency Triage Note.   Diabetes mellitus without complication (HCC) 07/2015   Dyslipidemia    Essential hypertension    Essential hypertension 01/02/2015   Formatting of this note might be different from the original.  Last Assessment & Plan:   Chronic, stable. Continue current regimen.   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 dilated, EF 35% improved to 45-50% (2015);  b. 07/2015 EF 25-35% by LV gram.   Ischemic cardiomyopathy 01/02/2015   Kidney stones 04/17/2021   Lone atrial fibrillation (HCC) 1983   a. isolated episode, not on OAC.   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 06/2014: LV; resolved with coumadin-->no residual on f/u echo, no longer on coumadin.   Mural thrombus of heart 08/26/2021   Formatting of this note might be different from the original. Jun 16, 2019 Entered By: [name] Comment: left ventricle, cardiac apex   Osteoarthritis    a. 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 Jun 16, 2019 Entered By: [name] Comment: leftAug 19, 2020 Entered By: [name] Comment: resolved by total left knee replacement Jun 16, 2019 Entered By: [name] Comment: leftAug 19, 2020 Entered By: [name] Comment: resolved by total left knee replacement   Trigger finger of left hand 07/07/2019   Vitamin D deficiency     PAST SURGICAL HISTORY:   Past Surgical History:  Procedure Laterality Date   BICEPS TENDON REPAIR Right 1993   CARDIAC CATHETERIZATION N/A 07/05/2015   Procedure: Left Heart Cath and Coronary Angiography;  Surgeon: Antonieta Iba, MD;  Location: [facility] INVASIVE CV LAB;  Service:  Cardiovascular;  Laterality: N/A;   CAROTID PTA/STENT INTERVENTION Left 04/26/2023   Procedure: CAROTID PTA/STENT INTERVENTION;  Surgeon: Annice Needy, MD;  Location: [facility] INVASIVE CV LAB;  Service: Cardiovascular;  Laterality: Left;   CATARACT EXTRACTION Left 12/2016   with keratoplasty   COLONOSCOPY  2007   COLONOSCOPY WITH PROPOFOL N/A 12/02/2017   TA, SSA, rpt 3 yrs(Tahiliani, Varnita B, MD)   COLONOSCOPY WITH PROPOFOL N/A 11/26/2020   Procedure: COLONOSCOPY WITH PROPOFOL;  Surgeon: Midge Minium, MD;  Location: [facility] ENDOSCOPY;  Service: Endoscopy;  Laterality: N/A;   CORONARY ARTERY BYPASS GRAFT N/A 07/29/2015   Procedure: CORONARY ARTERY BYPASS GRAFTING (CABG) x 5 (LIMA to LAD, SVG to DIAGONAL,  SVG SEQUENTIALLY to OM1 and OM2, SVG to OM3) with Endoscopic Vein Havesting of  GREATER SAPHENOUS VEIN from RIGHT THIGH and partial LOWER LEG ;  Surgeon: Alleen Borne, MD;  Location: [facility] OR;  Service: Open Heart Surgery;  Laterality: N/A;   EYE SURGERY     b/l cataract and cornea replaced    HAND SURGERY     left hand 1st/2nd trigger  fingers Dr. [facility]ac[facility] orth[facility]    JOINT REPLACEMENT     KNEE ARTHROSCOPY Left remote   MOHS SURGERY     left cheek scc 2022 Dr. Jeannine Boga   MOHS SURGERY     x 5 facial scc   right biceps tendon     repair/re attachment    SKIN CANCER EXCISION  10/2015   BCC - L ala (pending MOHs) and L scapula (complete excision)   TEE WITHOUT CARDIOVERSION N/A 07/29/2015   Procedure: TRANSESOPHAGEAL ECHOCARDIOGRAM (TEE);  Surgeon: Alleen Borne, MD;  Location: [facility] OR;  Service: Open Heart Surgery;  Laterality: N/A;   TONSILLECTOMY  1949   TOTAL KNEE ARTHROPLASTY Left 03/18/2016   cemented L TKR; Deeann Saint, MD    SOCIAL HISTORY:   Social History   Tobacco Use   Smoking status: Former    Packs/day: 0.50    Years: 10.00    Additional pack years: 0.00    Total pack years: 5.00    Types: Cigarettes    Quit date: 11/02/1978    Years since quitting: 44.5   Smokeless tobacco: Never   Tobacco comments:    former smoker 1967-1980 1 pk/week no FH lung cancer   Substance Use Topics   Alcohol use: Yes    Alcohol/week: 0.0 standard drinks of alcohol    Comment: beer/wine on weekends    FAMILY HISTORY:   Family History  Problem Relation Age of Onset   CAD Father 22       MI   Hypertension Father    Hyperlipidemia Father    Alcoholism Father    Diabetes Father    Cancer Daughter        dx'ed 48 retroperitoneal liposarcoma     DRUG ALLERGIES:   Allergies  Allergen Reactions   Bee Pollen Itching   Pollen Extract Itching    REVIEW OF SYSTEMS:   ROS As per history of present illness. All pertinent systems were reviewed above. Constitutional, HEENT, cardiovascular, respiratory, GI, GU, musculoskeletal, neuro, psychiatric, endocrine, integumentary and hematologic systems were reviewed and are otherwise negative/unremarkable except for positive findings mentioned above in the HPI.   MEDICATIONS AT HOME:   Prior to Admission medications   Medication Sig Start Date End Date Taking? Authorizing  Provider  acetaminophen (TYLENOL) 500 MG tablet Take 500 mg by mouth every 6 (six) hours as needed for mild  pain.    Yes [provider]  aspirin 81 MG EC tablet Take 162 mg by mouth daily. Swallow whole.   Yes [provider]  atorvastatin (LIPITOR) 40 MG tablet Take 1 tablet (40 mg total) by mouth daily. 02/23/23  Yes Sunnie Nielsen, DO  Cholecalciferol (VITAMIN D-3) 125 MCG (5000 UT) TABS Take 5,000 Units by mouth daily.   Yes [provider]  clopidogrel (PLAVIX) 75 MG tablet Take 1 tablet (75 mg total) by mouth daily. 02/23/23  Yes Sunnie Nielsen, DO  ezetimibe (ZETIA) 10 MG tablet Take 1 tablet (10 mg total) by mouth daily. 01/05/23  Yes Gollan, Tollie Pizza, MD  famotidine (PEPCID) 20 MG tablet TAKE 1 TABLET BY MOUTH 2 TIMES DAILY AS NEEDED FOR HEARTBURN/INDIGESTION. D/C ZANTAC 09/15/21  Yes [name], Pasty Spillers, MD  fluticasone (FLONASE) 50 MCG/ACT nasal spray Place 1 spray into both nostrils daily as needed for allergies.   Yes [provider]  lisinopril (ZESTRIL) 10 MG tablet TAKE 1 TABLET BY MOUTH  DAILY 04/25/22  Yes Worthy Rancher B, FNP  metFORMIN (GLUCOPHAGE) 500 MG tablet Take 1 tablet (500 mg total) by mouth 2 (two) times daily with a meal. Patient taking differently: Take 1,000 mg by mouth daily with supper. 08/26/22  Yes Dana Allan, MD  metoprolol succinate (TOPROL-XL) 25 MG 24 hr tablet TAKE 1 TABLET BY MOUTH ONCE  DAILY 03/09/23  Yes Gollan, Tollie Pizza, MD  prednisoLONE acetate (PRED FORTE) 1 % ophthalmic suspension Place 1 drop into both eyes daily.   Yes [provider]  sildenafil (REVATIO) 20 MG tablet Take 1 tablet (20 mg total) by mouth daily as needed. 02/22/23  Yes Dana Allan, MD  vitamin B-12 (CYANOCOBALAMIN) 500 MCG tablet Take 500 mcg by mouth daily.   Yes [provider]  lansoprazole (PREVACID) 30 MG capsule Take 1 capsule (30 mg total) by mouth every morning. Patient not taking: Reported on 05/02/2023  08/12/15   Eustaquio Boyden, MD      VITAL SIGNS:  Blood pressure 118/83, pulse (!) 57, temperature 97.9 F (36.6 C), temperature source Oral, resp. rate 17, SpO2 95 %.  PHYSICAL EXAMINATION:  Physical Exam  GENERAL:  82 y.o.-year-old Caucasian male patient lying in the bed with no acute distress.  EYES: Pupils equal, round, reactive to light and accommodation. No scleral icterus. Extraocular muscles intact.  HEENT: Head atraumatic, normocephalic. Oropharynx and nasopharynx clear.  NECK:  Supple, no jugular venous distention. No thyroid enlargement, no tenderness.  LUNGS: Normal breath sounds bilaterally, no wheezing, rales,rhonchi or crepitation. No use of accessory muscles of respiration.  CARDIOVASCULAR: Regular rate and rhythm, S1, S2 normal. No murmurs, rubs, or gallops.  ABDOMEN: Soft, nondistended, nontender. Bowel sounds present. No organomegaly or mass.  EXTREMITIES: No pedal edema, cyanosis, or clubbing.  NEUROLOGIC: Cranial nerves II through XII are intact. Muscle strength 5/5 in all extremities. Sensation intact. Gait not checked.  PSYCHIATRIC: The patient is alert and oriented x 3.  Normal affect and good eye contact. SKIN: No obvious rash, lesion, or ulcer.   LABORATORY PANEL:   CBC Recent Labs  Lab 05/02/23 2014  WBC 7.0  HGB 13.2  HCT 40.1  PLT 159   ------------------------------------------------------------------------------------------------------------------  Chemistries  Recent Labs  Lab 05/02/23 2014  NA 137  K 4.0  CL 102  CO2 26  GLUCOSE 95  BUN 21  CREATININE 1.01  CALCIUM 9.2  AST 23  ALT 15  ALKPHOS 51  BILITOT 0.6   ------------------------------------------------------------------------------------------------------------------  Cardiac Enzymes No results for input(s): "TROPONINI" in the last 168 hours. ------------------------------------------------------------------------------------------------------------------  RADIOLOGY:   CT HEAD WO CONTRAST  Result Date: 05/02/2023 CLINICAL DATA:  Slurred speech x5 minutes, now improved, carotid stent placed on Monday EXAM: CT HEAD WITHOUT CONTRAST TECHNIQUE: Contiguous axial images were obtained from the base of the skull through the vertex without intravenous contrast. RADIATION DOSE REDUCTION: This exam was performed according to the departmental dose-optimization program which includes automated exposure control, adjustment of the mA and/or kV according to patient size and/or use of iterative reconstruction technique. COMPARISON:  04/28/2023 FINDINGS: Brain: No evidence of acute infarction, hemorrhage, hydrocephalus, extra-axial collection or mass lesion/mass effect. Subcortical white matter and periventricular small vessel ischemic changes. Old left parietal infarct (series 2/image 25). Old lacunar infarct in the left corona radiata (series 2/image 17). Vascular: Intracranial atherosclerosis. Skull: Normal. Negative for fracture or focal lesion. Sinuses/Orbits: The visualized paranasal sinuses are essentially clear. The mastoid air cells are unopacified. Other: None. IMPRESSION: No acute intracranial abnormality. Old left parietal and corona radiata infarcts. Small vessel ischemic changes. Electronically Signed   By: Charline Bills M.D.   On: 05/02/2023 20:39      IMPRESSION AND PLAN:  Assessment and Plan: * TIA (transient ischemic attack) - This has been recurrent lately with recurrent expressive dysphasia. - He will be admitted to an observation medical telemetry bed. - We will follow neurochecks every 4 hours for 24 hours. - We will continue his aspirin and Plavix. - We will continue statin therapy and check fasting lipids. - Brain MRI without contrast is currently pending. --2D echo on fourth/23/24 revealed an EF more than 55% and grade 1 diastolic dysfunction.  I will not repeat echo at this time.  Will defer to neurology. - I will also hold off on CTA of the head and  neck and deferred to neurology. - PT/OT and ST consults will be obtained. - Neurology consult will be obtained and I notified Dr. Iver Nestle about the patient.  Essential hypertension - We will continue his antihypertensives with permissive parameters.  Dyslipidemia - We will continue statin therapy and Zetia. - We will check fasting lipids.  GERD without esophagitis - We will continue PPI therapy.   DVT prophylaxis: Lovenox.  Advanced Care Planning:  Code Status: The patient is DNR and DNI. Family Communication:  The plan of care was discussed in details with the patient (and family). I answered all questions. The patient agreed to proceed with the above mentioned plan. Further management will depend upon hospital course. Disposition Plan: Back to previous home environment Consults called: Neurology. All the records are reviewed and case discussed with ED provider.  Status is: Observation  I certify that at the time of admission, it is my clinical judgment that the patient will require hospital care extending less than 2 midnights.                            Dispo: The patient is from: Home              Anticipated d/c is to: Home              Patient currently is not medically stable to d/c.              Difficult to place patient: No  Hannah Beat M.D on 05/02/2023 at 10:33 PM  [facility]   From 7 PM-7 AM, contact night-coverage [email or web address]  CC:  Primary care physician; Dana Allan, MD

## 2023-05-03 ENCOUNTER — Telehealth: Payer: Self-pay | Admitting: Physician Assistant

## 2023-05-03 ENCOUNTER — Ambulatory Visit: Payer: Medicare Other | Admitting: Physician Assistant

## 2023-05-03 ENCOUNTER — Ambulatory Visit: Payer: Medicare Other

## 2023-05-03 ENCOUNTER — Encounter: Payer: Self-pay | Admitting: Family Medicine

## 2023-05-03 DIAGNOSIS — Z951 Presence of aortocoronary bypass graft: Secondary | ICD-10-CM

## 2023-05-03 DIAGNOSIS — G459 Transient cerebral ischemic attack, unspecified: Secondary | ICD-10-CM | POA: Diagnosis not present

## 2023-05-03 DIAGNOSIS — R479 Unspecified speech disturbances: Secondary | ICD-10-CM

## 2023-05-03 DIAGNOSIS — R569 Unspecified convulsions: Secondary | ICD-10-CM | POA: Diagnosis not present

## 2023-05-03 DIAGNOSIS — I6523 Occlusion and stenosis of bilateral carotid arteries: Secondary | ICD-10-CM

## 2023-05-03 DIAGNOSIS — E782 Mixed hyperlipidemia: Secondary | ICD-10-CM

## 2023-05-03 DIAGNOSIS — I25118 Atherosclerotic heart disease of native coronary artery with other forms of angina pectoris: Secondary | ICD-10-CM

## 2023-05-03 DIAGNOSIS — I700 Atherosclerosis of aorta: Secondary | ICD-10-CM

## 2023-05-03 DIAGNOSIS — I152 Hypertension secondary to endocrine disorders: Secondary | ICD-10-CM

## 2023-05-03 DIAGNOSIS — Z8673 Personal history of transient ischemic attack (TIA), and cerebral infarction without residual deficits: Secondary | ICD-10-CM

## 2023-05-03 LAB — LIPID PANEL
Cholesterol: 115 mg/dL (ref 0–200)
HDL: 42 mg/dL (ref >40)
LDL Cholesterol: 51 mg/dL (ref 0–99)
Total CHOL/HDL Ratio: 2.7 RATIO
Triglycerides: 111 mg/dL (ref <150)
VLDL: 22 mg/dL (ref 0–40)

## 2023-05-03 LAB — URINALYSIS, ROUTINE W REFLEX MICROSCOPIC
Bilirubin Urine: NEGATIVE
Glucose, UA: NEGATIVE mg/dL
Hgb urine dipstick: NEGATIVE
Ketones, ur: NEGATIVE mg/dL
Leukocytes,Ua: NEGATIVE
Nitrite: NEGATIVE
Protein, ur: NEGATIVE mg/dL
Specific Gravity, Urine: 1.013 (ref 1.005–1.030)
pH: 5 (ref 5.0–8.0)

## 2023-05-03 LAB — GLUCOSE, CAPILLARY
Glucose-Capillary: 109 mg/dL — ABNORMAL HIGH (ref 70–99)
Glucose-Capillary: 113 mg/dL — ABNORMAL HIGH (ref 70–99)
Glucose-Capillary: 126 mg/dL — ABNORMAL HIGH (ref 70–99)

## 2023-05-03 MED ORDER — LEVETIRACETAM 500 MG PO TABS
500.0000 mg | ORAL_TABLET | Freq: Two times a day (BID) | ORAL | Status: DC
  Administered 2023-05-03: 500 mg via ORAL
  Filled 2023-05-03: qty 1

## 2023-05-03 MED ORDER — LEVETIRACETAM 500 MG PO TABS: 500.0000 mg | ORAL_TABLET | Freq: Two times a day (BID) | ORAL | 0 refills | Status: DC

## 2023-05-03 NOTE — Evaluation (Signed)
Physical Therapy Evaluation Patient Details Name: Marte Allday MRN: [id number] DOB: Jan 06, 1941 Today's Date: 05/03/2023  History of Present Illness  Patient is a 82 year old male with recurrent aphasia. History of recent CVA and carotid stenting  Clinical Impression  Patient is agreeable to PT. He reports he is independent at baseline with ambulation and was getting outpatient PT for higher level balance prior to this hospital stay. He lives with his spouse.  The patient appears to be at his baseline level of functional independence today. No physical assistance required with bed mobility, transfers, ambulation. Recommend to continue outpatient PT for balance. No apparent acute PT needs at this time.      Recommendations for follow up therapy are one component of a multi-disciplinary discharge planning process, led by the attending physician.  Recommendations may be updated based on patient status, additional functional criteria and insurance authorization.  Follow Up Recommendations       Assistance Recommended at Discharge PRN  Patient can return home with the following  Assist for transportation    Equipment Recommendations None recommended by PT  Recommendations for Other Services       Functional Status Assessment Patient has not had a recent decline in their functional status     Precautions / Restrictions Precautions Precautions: Fall Restrictions Weight Bearing Restrictions: No      Mobility  Bed Mobility Overal bed mobility: Modified Independent                  Transfers Overall transfer level: Modified independent                      Ambulation/Gait Ambulation/Gait assistance: Supervision Gait Distance (Feet): 75 Feet Assistive device: None Gait Pattern/deviations: Wide base of support Gait velocity: decreased     General Gait Details: patient ambulated in the room without device. no loss of balance noted, no shortness of breath is  noted  Stairs            Wheelchair Mobility    Modified Rankin (Stroke Patients Only)       Balance Overall balance assessment: Needs assistance Sitting-balance support: Feet supported Sitting balance-Leahy Scale: Normal     Standing balance support: No upper extremity supported Standing balance-Leahy Scale: Fair                               Pertinent Vitals/Pain      Home Living Family/patient expects to be discharged to:: Private residence Living Arrangements: Spouse/significant other Available Help at Discharge: Family;Available 24 hours/day Type of Home: House Home Access: Stairs to enter   [employer] of Steps: 1       Additional Comments: has been going to outpatient PT for balance    Prior Function Prior Level of Function : Independent/Modified Independent                     Hand Dominance   Dominant Hand: Right    Extremity/Trunk Assessment   Upper Extremity Assessment Upper Extremity Assessment: Overall WFL for tasks assessed    Lower Extremity Assessment Lower Extremity Assessment: Overall WFL for tasks assessed       Communication   Communication: No difficulties  Cognition Arousal/Alertness: Awake/alert Behavior During Therapy: WFL for tasks assessed/performed Overall Cognitive Status: No family/caregiver present to determine baseline cognitive functioning  General Comments: patient is able to follow all commands without difficulty. patient reports history of aphasia but not noted currently        General Comments General comments (skin integrity, edema, etc.): patient feels he is at his baseline level of functional independence    Exercises     Assessment/Plan    PT Assessment All further PT needs can be met in the next venue of care  PT Problem List         PT Treatment Interventions      PT Goals (Current goals can be found in the Care  Plan section)  Acute Rehab PT Goals PT Goal Formulation: All assessment and education complete, DC therapy    Frequency       Co-evaluation               AM-PAC PT "6 Clicks" Mobility  Outcome Measure Help needed turning from your back to your side while in a flat bed without using bedrails?: None Help needed moving from lying on your back to sitting on the side of a flat bed without using bedrails?: None Help needed moving to and from a bed to a chair (including a wheelchair)?: None Help needed standing up from a chair using your arms (e.g., wheelchair or bedside chair)?: None Help needed to walk in hospital room?: None Help needed climbing 3-5 steps with a railing? : None 6 Click Score: 24    End of Session   Activity Tolerance: Patient tolerated treatment well Patient left: in chair;with call bell/phone within reach Nurse Communication: Mobility status      Time: 0865-7846 PT Time Calculation (min) (ACUTE ONLY): 16 min   Charges:   PT Evaluation $PT Eval Low Complexity: 1 Low          Donna Bernard, PT, MPT   Ina Homes 05/03/2023, 11:05 AM

## 2023-05-03 NOTE — Consult Note (Addendum)
Neurology Consultation Reason for Consult: Recurrent aphasia Requesting Physician: Lanae Boast  CC: Difficulty getting words out   History is obtained from: Patient, chart review, partner at bedside (fianc, together for 30 years)  HPI: Shannon Chung is a 82 y.o. male with a past medical history significant for recent TIA versus aborted stroke (04/24/2023), coronary artery disease s/p CABG, roommate A-fib without recurrence not on anticoagulation, hypertension, hyperlipidemia, prediabetes.  On 02/22/2023 he received TNK for right arm and leg drift as well as aphasia with stroke workup notable for 70% left carotid stenosis and negative MRI.  He did have event monitor placed after this event which was pending results due today per patient and partner at bedside.  Subsequently he had left carotid endarterectomy and postoperatively on 6/24 had expressive aphasia with word salad noted at the end of long sentences in particular in the setting of some relative hypotension.  He had 2 further episodes on Monday 6/24, and then 1 on Tuesday 6/25 in the evening, and then a recurrent episode on 6/30 again in the evening.  Episodes have lasted anywhere from 5 to 60 minutes.  He has been adherent with his DAPT started after his event on 4/22 and continued to due to carotid stent.  He is still driving at baseline but was told to hold off on driving for 2 weeks after stent placement by surgical team.  No other recent cognitive complaints or concerns on full review of systems.  He has been undergoing physical therapy for gait/balance training since TNK administration, but otherwise has no physical limitations  Of note, outpatient neurology primary care evaluation noted MMSE 20/30 in 2018, however this was recovered to 29/35/24/2019.  He saw Dr. Marjory Lies on 04/15/2022 due to CT head findings of atrophy and some chronic infarcts on head CT when he was taken to the hospital after a fall while in [place].  Continued medical  management was recommended with follow-up with PCP and cardiology  He has not had any seizure like movements with these episodes.  He has no other seizure risk factors -- reports normal birth and development, no prior history of seizures, no meningitis/encephalitis, no significant head trauma, no family history of seizures  Premorbid modified rankin scale: 0-1  ROS: All other review of systems was negative except as noted in the HPI.   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 06/2015 Cardiac CT: Ca score 1103 (84th %'ile);  b. 07/2015 Cath: LM 70, LAD 80p, 100/29m, D1 70, D2 95, RI 75, RCA 100p/m;  c. 07/2015 CABG x 5 (LIMA->LAD, VG->Diag, VG->OM1->OM2, VG->OM3).   COVID-19    12/2021   COVID-19 01/18/2022   COVID-19 vaccine administered 01/18/2022   Unknown how many vaccine doses have been received. Entered from Emergency Triage Note.   Diabetes mellitus without complication (HCC) 07/2015   Dyslipidemia    Essential hypertension    Essential hypertension 01/02/2015   Formatting of this note might be different from the original.  Last Assessment & Plan:   Chronic, stable. Continue current regimen.   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 dilated, EF 35% improved to 45-50% (2015);  b. 07/2015 EF 25-35% by LV gram.   Ischemic cardiomyopathy 01/02/2015   Kidney stones 04/17/2021   Lone atrial fibrillation (HCC) 1983   a. isolated episode, not on OAC.   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 06/2014: LV; resolved with coumadin-->no residual on f/u echo, no longer on coumadin.   Mural thrombus of heart 08/26/2021   Formatting of this note might be different from the original. Jun 16, 2019 Entered By: [name] Comment: left ventricle, cardiac apex   Osteoarthritis    a. 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 Jun 16, 2019 Entered By: [name] Comment: leftAug 19, 2020 Entered By: [name] Comment: resolved by  total left knee replacement Jun 16, 2019 Entered By: [name] Comment: leftAug 19, 2020 Entered By: [name] Comment: resolved by total left knee replacement   Trigger finger of left hand 07/07/2019   Vitamin D deficiency    Past Surgical History:  Procedure Laterality Date   BICEPS TENDON REPAIR Right 1993   CARDIAC CATHETERIZATION N/A 07/05/2015   Procedure: Left Heart Cath and Coronary Angiography;  Surgeon: Antonieta Iba, MD;  Location: [facility] INVASIVE CV LAB;  Service: Cardiovascular;  Laterality: N/A;   CAROTID PTA/STENT INTERVENTION Left 04/26/2023   Procedure: CAROTID PTA/STENT INTERVENTION;  Surgeon: Annice Needy, MD;  Location: [facility] INVASIVE CV LAB;  Service: Cardiovascular;  Laterality: Left;   CATARACT EXTRACTION Left 12/2016   with keratoplasty   COLONOSCOPY  2007   COLONOSCOPY WITH PROPOFOL N/A 12/02/2017   TA, SSA, rpt 3 yrs(Tahiliani, Varnita B, MD)   COLONOSCOPY WITH PROPOFOL N/A 11/26/2020   Procedure: COLONOSCOPY WITH PROPOFOL;  Surgeon: Midge Minium, MD;  Location: [facility] ENDOSCOPY;  Service: Endoscopy;  Laterality: N/A;   CORONARY ARTERY BYPASS GRAFT N/A 07/29/2015   Procedure: CORONARY ARTERY BYPASS GRAFTING (CABG) x 5 (LIMA to LAD, SVG to DIAGONAL,  SVG SEQUENTIALLY to OM1 and OM2, SVG to OM3) with Endoscopic Vein Havesting of  GREATER SAPHENOUS VEIN from RIGHT THIGH and partial LOWER LEG ;  Surgeon: Alleen Borne, MD;  Location: [facility] OR;  Service: Open Heart Surgery;  Laterality: N/A;   EYE SURGERY     b/l cataract and cornea replaced    HAND SURGERY     left hand 1st/2nd trigger fingers Dr. Hyacinth Meeker ortho    JOINT REPLACEMENT     KNEE ARTHROSCOPY Left remote   MOHS SURGERY     left cheek scc 2022 Dr. Jeannine Boga   MOHS SURGERY     x 5 facial scc   right biceps tendon     repair/re attachment    SKIN CANCER EXCISION  10/2015   BCC - L ala (pending MOHs) and L scapula (complete excision)   TEE WITHOUT CARDIOVERSION N/A 07/29/2015   Procedure:  TRANSESOPHAGEAL ECHOCARDIOGRAM (TEE);  Surgeon: Alleen Borne, MD;  Location[facility] OR;  Service: Open Heart Surgery;  Laterality: N/A;   TONSILLECTOMY  1949   TOTAL KNEE ARTHROPLASTY Left 03/18/2016   cemented L TKR; Deeann Saint, MD    Current Outpatient Medications  Medication Instructions   acetaminophen (TYLENOL) 500 mg, Oral, Every 6 hours PRN   aspirin EC 162 mg, Oral, Daily, Swallow whole.    atorvastatin (LIPITOR) 40 mg, Oral, Daily   clopidogrel (PLAVIX) 75 mg, Oral, Daily   ezetimibe (ZETIA) 10 mg, Oral, Daily   famotidine (PEPCID) 20 MG tablet TAKE 1 TABLET BY MOUTH 2 TIMES DAILY AS NEEDED FOR HEARTBURN/INDIGESTION. D/C ZANTAC   fluticasone (FLONASE) 50 MCG/ACT nasal spray 1 spray, Each Nare, Daily PRN   lansoprazole (PREVACID) 30 mg, Oral, BH-each morning   lisinopril (ZESTRIL) 10 MG tablet TAKE 1 TABLET BY MOUTH  DAILY   metFORMIN (GLUCOPHAGE) 500 mg, Oral, 2 times daily with meals   metoprolol succinate (TOPROL-XL) 25 mg, Oral, Daily   prednisoLONE acetate (PRED FORTE) 1 % ophthalmic suspension 1 drop, Both Eyes, Daily   sildenafil (REVATIO) 20 mg, Oral, Daily PRN   vitamin B-12 (CYANOCOBALAMIN) 500 mcg, Oral, Daily   Vitamin D-3 5,000 Units, Oral, Daily     Family History  Problem Relation Age of Onset   CAD Father 30       MI   Hypertension Father    Hyperlipidemia Father    Alcoholism Father    Diabetes Father    Cancer Daughter        dx'ed 59 retroperitoneal liposarcoma     Social History:  reports that he quit smoking about 44 years ago. His smoking use included cigarettes. He has a 5.00 pack-year smoking history. He has never used smokeless tobacco. He reports current alcohol use. He reports that he does not use drugs.   Exam: Current vital signs: BP 137/81 (BP Location: Right Arm)   Pulse (!) 57   Temp 97.7 F (36.5 C) (Oral)   Resp 18   Ht 6' (1.829 m)   Wt 90.6 kg   SpO2 98%   BMI 27.09 kg/m  Vital signs in last 24 hours: Temp:  [97.7 F  (36.5 C)-98.2 F (36.8 C)] 97.7 F (36.5 C) (07/01 0745) Pulse Rate:  [54-57] 57 (07/01 0745) Resp:  [17-18] 18 (07/01 0745) BP: (112-147)/(72-86) 137/81 (  07/01 0745) SpO2:  [95 %-99 %] 98 % (07/01 0745) Weight:  [90.6 kg] 90.6 kg (06/30 2250)   Physical Exam  Constitutional: Appears well-developed and well-nourished.  Psych: Affect appropriate to situation, pleasant and cooperative Eyes: No scleral injection HENT: No oropharyngeal obstruction.  MSK: no joint deformities.  Cardiovascular: Perfusing extremities well Respiratory: Effort normal, non-labored breathing GI: Soft.  No distension. There is no tenderness.  Skin: Warm dry and intact visible skin  Neuro: Mental Status: Patient is awake, alert, oriented to person, place, month, year, and situation. Patient is able to give a clear and coherent history. No signs of aphasia or neglect MMSE testing today 28/30 (2 points lost on short-term recall, otherwise no deficits) Cranial Nerves: II: Visual Fields are full. Pupils are equal, round, and reactive to light.   III,IV, VI: EOMI without ptosis or diploplia.  V: Facial sensation is symmetric to temperature VII: Facial movement is symmetric.  VIII: hearing is intact to voice X: Uvula elevates symmetrically XI: Shoulder shrug is symmetric. XII: tongue is midline without atrophy or fasciculations.  Motor: Tone is normal. Bulk is normal. 5/5 strength was present in all four extremities, other than bilateral deltoid weakness 4 -/5.  Right upper extremity pronation without drift Sensory: Sensation is symmetric to light touch and temperature in the arms and legs. Deep Tendon Reflexes: 2+ and symmetric in the brachioradialis, right patella 2+, left patella difficult to elicit due to patient non-relaxation.  Cerebellar: FNF and HKS are intact bilaterally Gait:  Mildly wide-based, ambulates from bathroom to chair with IV pole without any other support, baseline per patient and  family  NIHSS total 0  I have reviewed labs in epic and the results pertinent to this consultation are:   Basic Metabolic Panel: Recent Labs  Lab 04/26/23 0914 04/27/23 0651 04/28/23 1009 05/02/23 2014  NA  --  138 138 137  K  --  4.0 4.0 4.0  CL  --  104 105 102  CO2  --  25 23 26   GLUCOSE  --  141* 130* 95  BUN 15 10 15 21   CREATININE 0.90 0.88 0.97 1.01  CALCIUM  --  8.9 9.3 9.2    CBC: Recent Labs  Lab 04/27/23 0651 04/28/23 1009 05/02/23 2014  WBC 6.9 7.1 7.0  NEUTROABS  --  3.8 2.8  HGB 14.1 14.3 13.2  HCT 42.1 43.1 40.1  MCV 96.3 97.3 99.0  PLT 163 172 159    Coagulation Studies: Recent Labs    05/02/23 2014  LABPROT 13.4  INR 1.0     Lab Results  Component Value Date   HGBA1C 6.4 (H) 02/22/2023    Lab Results  Component Value Date   CHOL 115 05/03/2023   HDL 42 05/03/2023   LDLCALC 51 05/03/2023   TRIG 111 05/03/2023   CHOLHDL 2.7 05/03/2023    I have reviewed the images obtained:  MRI brain 05/02/2023 personally reviewed, agree with radiology, no acute intracranial process  Brain: No definite restricted diffusion to suggest acute or subacute infarct. Minimally increased left periventricular signal on diffusion-weighted imaging is favored to represent T2 shine through. No acute hemorrhage, mass, mass effect, or midline shift. No hydrocephalus or extra-axial collection. Remote lacunar infarcts in the left basal ganglia and corona radiata. Remote cortical infarct in the posterior left frontal and left parietal lobe. Two punctate areas of hemosiderin deposition in the left frontal lobe on my review   ECHO 02/2023  1. Left ventricular ejection fraction, by  estimation, is >55%. The left  ventricle has normal function. Left ventricular endocardial border not  optimally defined to evaluate regional wall motion. There is mild left  ventricular hypertrophy. Left  ventricular diastolic parameters are consistent with Grade I diastolic   dysfunction (impaired relaxation).   2. Right ventricular systolic function was not well visualized. The right  ventricular size is normal. Tricuspid regurgitation signal is inadequate  for assessing PA pressure.   3. The mitral valve is grossly normal. No evidence of mitral valve  regurgitation. No evidence of mitral stenosis.   4. The aortic valve is tricuspid. There is mild thickening of the aortic  valve. Aortic valve regurgitation is not visualized. Aortic valve  sclerosis is present, with no evidence of aortic valve stenosis.   CTA 04/26/2023 1. Interval placement of a stent in the distal left common and proximal left internal carotid artery, with beam hardening artifact limiting evaluation for in-stent thrombus; however, as contrast is seen at the proximal and distal ends, it is presumed to be patent. 2. 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 3. Severe narrowing in the right V2, mild narrowing in the left V2, and moderate stenosis in the left supraclinoid ICA and left P2 segment. 4. No intracranial large vessel occlusion. 5. Aortic atherosclerosis.  CTA 02/22/2023 1. No emergent large vessel occlusion. 2. 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 3. 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 4. Moderate stenosis of the left paraclinoid ICA, left intradural vertebral artery, and left P2 PCA.    Assessment: Unclear etiology of recurrent word finding difficulties at this time.  Discussed with patient/wife, differential includes recurrent TIA although this is a little less likely with stereotyped symptoms.  However given his significant cardiac history, and the fact that none of these events were captured while he was on monitoring, if monitoring has not already shown atrial fibrillation I would recommend loop recorder.  Focal seizures could be considered  given the stereotyped nature of the events, though he does not have any other risk factors for seizure and the semiology is not clearly seizure-like.  As a diagnosis of exclusion, could consider this being onset of mild cognitive impairment/early dementia  His right upper extremity pronation without drift may also be referable to his prior right biceps tendon repair and reattachment  Impression: TIA versus focal seizure versus possible early signs of dementia  Recommendations: -Follow-up outpatient event monitor recorder, if negative for atrial fibrillation, appreciate cardiology consultation for loop recorder (can be completed outpatient if not available in the hospital today) -Routine EEG -Outpatient detailed neurocognitive testing (not available inpatient) -If EEG is negative, from neurological perspective appropriate for outpatient follow-up, referral to Dr. Marjory Lies placed at patient and partner request -Driving restrictions as previously conveyed to family by surgical team; if EEG is concerning for ictal/interictal activity would recommend driving restrictions for a full 6 months and standard seizure precautions as well as initiation of antiseizure medication, but will hold off on medications or additional restrictions if EEG is negative -Patient/family member requested return to volunteer activity clearance, no neurological contraindications to return -Discussed with patient and partner, if he has recurrent symptoms and they remain isolated to speech and resolve in less than 60 minutes, okay to observe carefully, but he if he has any other associated symptoms he should still present to the ED for full evaluation  Addendum:   EEG  ABNORMALITY -  Intermittent slow, left temporal region IMPRESSION: This study is suggestive of cortical dysfunction arising from left temporal region, non specific etiology. No seizures or epileptiform discharges were seen throughout the recording.   Findings  not clearly ictal but given remote cortical infarcts, frequency of events and the fact that the EEG is not normal, I do feel risks of antiseizure meds are outweighed by benefits.  - Keppra 500 mg BID, first dose tonight,  - Include seizure precautions in discharge, instructions reviewed with wife and patient at bedside  Standard seizure precautions: Per [facility] statutes, patients with seizures are not allowed to drive until  they have been seizure-free for six months. Use caution when using heavy equipment or power tools. Avoid working on ladders or at heights. Take showers instead of baths. Ensure the water temperature is not too high on the home water heater. Do not go swimming alone. When caring for infants or small children, sit down when holding, feeding, or changing them to minimize risk of injury to the child in the event you have a seizure.  To reduce risk of seizures, maintain good sleep hygiene avoid alcohol and illicit drug use, take all anti-seizure medications as prescribed.    Brooke Dare MD-PhD [facility] [phone] Available 7 AM to 7 PM, outside these hours please contact Neurologist on call listed on AMION

## 2023-05-03 NOTE — Procedures (Signed)
Patient Name: Shannon Chung  MRN: [id number]  Epilepsy Attending: Charlsie Quest  Referring Physician/Provider: Gordy Councilman, MD  Date: 05/03/2023 Duration: 29.07 mins  Patient history: 82yo M with word finding difficulties. EEG to evaluate for seizure.  Level of alertness: Awake, asleep  AEDs during EEG study: None  Technical aspects: This EEG study was done with scalp electrodes positioned according to the 10-20 International system of electrode placement. Electrical activity was reviewed with band pass filter of 1-70Hz , sensitivity of 7 uV/mm, display speed of 40mm/sec with a 60Hz  notched filter applied as appropriate. EEG data were recorded continuously and digitally stored.  Video monitoring was available and reviewed as appropriate.  Description: The posterior dominant rhythm consists of 8-9 Hz activity of moderate voltage (25-35 uV) seen predominantly in posterior head regions, symmetric and reactive to eye opening and eye closing. Sleep was characterized by vertex waves, sleep spindles (12 to 14 Hz), maximal frontocentral region. EEG showed intermittent 3 to 6 Hz theta-delta slowing in left temporal region.Hyperventilation and photic stimulation were not performed.     ABNORMALITY - Intermittent slow, left temporal region  IMPRESSION: This study is suggestive of cortical dysfunction arising from left temporal region, non specific etiology. No seizures or epileptiform discharges were seen throughout the recording.  Shannon Chung Annabelle Harman

## 2023-05-03 NOTE — Hospital Course (Addendum)
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 Denied urinary or stool incontinence. 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 Most recent CTA of the head and neck :04/26/2023>Interval placement of a stent in the distal left common and proximal left internal carotid artery, with beam hardening artifact limiting evaluation for in-stent thrombus; however, as contrast is seen at the proximal and distal ends, it is presumed to be patent. 2. 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 3. Severe narrowing in the right V2, mild narrowing in the left V2,and moderate stenosis in the left supraclinoid ICA and left P2 segment.4. No intracranial large vessel occlusion. 5. Aortic atherosclerosis. Patient was admitted for further management, brain MRI was pending, subsequently resulted and negative for acute findings Seen by neurology, further workup completed w/ EEG. Planning for d/c home.

## 2023-05-03 NOTE — Discharge Summary (Signed)
Physician Discharge Summary  Shannon Chung KGM:[id number] DOB: 12/18/1940 DOA: 05/02/2023  PCP: Dana Allan, MD  Admit date: 05/02/2023 Discharge date: 05/03/2023 Recommendations for Outpatient Follow-up:  Follow up with PCP in 1 weeks-call for appointment Please obtain BMP/CBC in one week Need outpatient loop recorder  Discharge Dispo: home Discharge Condition: Stable Code Status:   Code Status: DNR Diet recommendation:  Diet Order             Diet Carb Modified Fluid consistency: Thin; Room service appropriate? 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 Denied urinary or stool incontinence. 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 Most recent CTA of the head and neck :04/26/2023>Interval placement of a stent in the distal left common and proximal left internal carotid artery, with beam hardening artifact limiting evaluation for in-stent thrombus; however, as contrast is seen at the proximal and distal ends, it is presumed to be patent. 2. 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 3. Severe narrowing in the right V2, mild narrowing in the left V2,and moderate stenosis in the left supraclinoid ICA and left P2 segment.4. No intracranial large vessel occlusion. 5. Aortic atherosclerosis. Patient was admitted for further management, brain MRI was pending, subsequently resulted and negative for acute  findings Seen by neurology, further workup completed w/ EEG. Planning for d/c home.   Discharge Diagnoses:  Principal Problem:   TIA (transient ischemic attack) Active Problems:   Essential hypertension   Dyslipidemia   GERD without esophagitis  TIA with recurrent expressive aphasia symptoms: Symptoms have been recurrent happen 4 times with resolution.  Workup with CT head MRI brain no acute finding, CTA head and neck reviewed from 6/24 with a S/P stent in distal left common and proximal internal carotid artery presumed to patent, and 60% stenosis in the proximal right ICA, severe narrowing in the right V2 and left V2. 2D echo from recent admission EF 50% G1 DD.  PT OT ST consulted and neurology consulte. Continue patient's home aspirin ( he is on 162mg ) , Lipitor, Plavix, and Zetia.  Neurology planning for outpatient loop recorder for further evaluation.  An EEG p done -reported as  :This study is suggestive of cortical dysfunction arising from left temporal region, non specific etiology.No seizures or epileptiform discharges were seen throughout the recording.  Per neuro:  "Findings not clearly ictal but given remote cortical infarcts, frequency of events and the fact that the EEG is not normal, I do feel risks of antiseizure meds are outweighed by benefits.  - Keppra 500 mg BID - Include seizure precautions in discharge instructions"  And informed not to drive for 6 months.  Standard seizure precautions added as below: Per [facility] statutes, patients with seizures are not allowed to drive until  they have been seizure-free for six months. Use caution when using heavy equipment or power tools. Avoid working on ladders or at  heights. Take showers instead of baths. Ensure the water temperature is not too high on the home water heater. Do not go swimming alone. When caring for infants or small children, sit down when holding, feeding, or changing them to minimize risk of injury to the  child in the event you have a seizure.  To reduce risk of seizures, maintain good sleep hygiene avoid alcohol and illicit drug use, take all anti-seizure medications as prescribed.  Hypertension:BP controlled on lisinopril and metoprolol Hyperlipidemia:LDL at goal 51,continue home Lipitor 40 mg   Consults: Neurology Subjective: Aaox3, feels alright  Discharge Exam: Vitals:   05/03/23 1030 05/03/23 1430  BP: (!) 141/89 119/73  Pulse: (!) 59 (!) 58  Resp: 19 20  Temp: 97.8 F (36.6 C) 97.7 F (36.5 C)  SpO2: 98% 96%  General: Pt is alert, awake, not in acute distress Cardiovascular: RRR, S1/S2 +, no rubs, no gallops Respiratory: CTA bilaterally, no wheezing, no rhonchi Abdominal: Soft, NT, ND, bowel sounds + Extremities: no edema, no cyanosis  Discharge Instructions  Discharge Instructions     Ambulatory referral to Neurology   Complete by: As directed    An appointment is requested in approximately: 4-8 weeks   Discharge instructions   Complete by: As directed    You will need further cardiac monitor device please follow-up with cardiology as outpatient  Please call call MD or return to ER for similar or worsening recurring problem that brought you to hospital or if any fever,nausea/vomiting,abdominal pain, uncontrolled pain, chest pain,  shortness of breath or any other alarming symptoms.  Please follow-up your doctor as instructed in a week time and call the office for appointment.  Please avoid alcohol, smoking, or any other illicit substance and maintain healthy habits including taking your regular medications as prescribed.  You were cared for by a hospitalist during your hospital stay. If you have any questions about your discharge medications or the care you received while you were in the hospital after you are discharged, you can call the unit and ask to speak with the hospitalist on call if the hospitalist that took care of you is not available.  Once you are  discharged, your primary care physician will handle any further medical issues. Please note that NO REFILLS for any discharge medications will be authorized once you are discharged, as it is imperative that you return to your primary care physician (or establish a relationship with a primary care physician if you do not have one) for your aftercare needs so that they can reassess your need for medications and monitor your lab values  Per [facility] statutes, patients with seizures are not allowed to drive until  they have been seizure-free for six months. Use caution when using heavy equipment or power tools. Avoid working on ladders or at heights. Take showers instead of baths. Ensure the water temperature is not too high on the home water heater. Do not go swimming alone. When caring for infants or small children, sit down when holding, feeding, or changing them to minimize risk of injury to the child in the event you have a seizure.  Maintain good sleep hygiene. Avoid alcohol.   Increase activity slowly   Complete by: As directed       Allergies as of 05/03/2023       Reactions   Bee Pollen Itching   Pollen Extract Itching        Medication List     TAKE these medications  acetaminophen 500 MG tablet Commonly known as: TYLENOL Take 500 mg by mouth every 6 (six) hours as needed for mild pain.   aspirin EC 81 MG tablet Take 162 mg by mouth daily. Swallow whole.   atorvastatin 40 MG tablet Commonly known as: LIPITOR Take 1 tablet (40 mg total) by mouth daily.   clopidogrel 75 MG tablet Commonly known as: Plavix Take 1 tablet (75 mg total) by mouth daily.   ezetimibe 10 MG tablet Commonly known as: ZETIA Take 1 tablet (10 mg total) by mouth daily.   famotidine 20 MG tablet Commonly known as: PEPCID TAKE 1 TABLET BY MOUTH 2 TIMES DAILY AS NEEDED FOR HEARTBURN/INDIGESTION. D/C ZANTAC   fluticasone 50 MCG/ACT nasal spray Commonly known as: FLONASE Place 1 spray  into both nostrils daily as needed for allergies.   levETIRAcetam 500 MG tablet Commonly known as: KEPPRA Take 1 tablet (500 mg total) by mouth 2 (two) times daily.   lisinopril 10 MG tablet Commonly known as: ZESTRIL TAKE 1 TABLET BY MOUTH  DAILY   metFORMIN 500 MG tablet Commonly known as: GLUCOPHAGE Take 1 tablet (500 mg total) by mouth 2 (two) times daily with a meal. What changed:  how much to take when to take this   metoprolol succinate 25 MG 24 hr tablet Commonly known as: TOPROL-XL TAKE 1 TABLET BY MOUTH ONCE  DAILY   prednisoLONE acetate 1 % ophthalmic suspension Commonly known as: PRED FORTE Place 1 drop into both eyes daily.   sildenafil 20 MG tablet Commonly known as: REVATIO Take 1 tablet (20 mg total) by mouth daily as needed.   vitamin B-12 500 MCG tablet Commonly known as: CYANOCOBALAMIN Take 500 mcg by mouth daily.   Vitamin D-3 125 MCG (5000 UT) Tabs Take 5,000 Units by mouth daily.        Follow-up Information     Dana Allan, MD Follow up in 1 week(s).   Specialty: Family Medicine Contact information: [address] [place] [address] [phone]                Allergies  Allergen Reactions   Bee Pollen Itching   Pollen Extract Itching   The results of significant diagnostics from this hospitalization (including imaging, microbiology, ancillary and laboratory) are listed below for reference.   Microbiology: No results found for this or any previous visit (from the past 240 hour(s)).  Procedures/Studies: EEG adult  Result Date: 05/19/2023 Charlsie Quest, MD     May 19, 2023  1:35 PM Patient Name: Shannon Chung MRN: [id number] Epilepsy Attending: Charlsie Quest Referring Physician/Provider: Gordy Councilman, MD Date: 19-May-2023 Duration: 29.07 mins Patient history: 82yo M with word finding difficulties. EEG to evaluate for seizure. Level of alertness: Awake, asleep AEDs during EEG study: None Technical aspects: This EEG  study was done with scalp electrodes positioned according to the 10-20 International system of electrode placement. Electrical activity was reviewed with band pass filter of 1-70Hz , sensitivity of 7 uV/mm, display speed of 66mm/sec with a 60Hz  notched filter applied as appropriate. EEG data were recorded continuously and digitally stored.  Video monitoring was available and reviewed as appropriate. Description: The posterior dominant rhythm consists of 8-9 Hz activity of moderate voltage (25-35 uV) seen predominantly in posterior head regions, symmetric and reactive to eye opening and eye closing. Sleep was characterized by vertex waves, sleep spindles (12 to 14 Hz), maximal frontocentral region. EEG showed intermittent 3 to 6 Hz theta-delta slowing in left temporal region.Hyperventilation and  photic stimulation were not performed.   ABNORMALITY - Intermittent slow, left temporal region IMPRESSION: This study is suggestive of cortical dysfunction arising from left temporal region, non specific etiology. No seizures or epileptiform discharges were seen throughout the recording. Charlsie Quest   MR BRAIN WO CONTRAST  Result Date: 05/02/2023 CLINICAL DATA:  Aphasia EXAM: MRI HEAD WITHOUT CONTRAST TECHNIQUE: Multiplanar, multiecho pulse sequences of the brain and surrounding structures were obtained without intravenous contrast. COMPARISON:  04/28/2023 FINDINGS: Brain: No definite restricted diffusion to suggest acute or subacute infarct. Minimally increased left periventricular signal on diffusion-weighted imaging is favored to represent T2 shine through. No acute hemorrhage, mass, mass effect, or midline shift. No hydrocephalus or extra-axial collection. Remote lacunar infarcts in the left basal ganglia and corona radiata. Remote cortical infarct in the posterior left frontal and left parietal lobe. Vascular: Normal arterial flow voids. Skull and upper cervical spine: Normal marrow signal. Sinuses/Orbits:  Maxillary mucous retention cysts. Otherwise clear paranasal sinuses. Status post bilateral lens replacements. Other: Fluid in bilateral mastoid air cells. IMPRESSION: No acute intracranial process. No evidence of acute or subacute infarct. Electronically Signed   By: Wiliam Ke M.D.   On: 05/02/2023 22:55   CT HEAD WO CONTRAST  Result Date: 05/02/2023 CLINICAL DATA:  Slurred speech x5 minutes, now improved, carotid stent placed on Monday EXAM: CT HEAD WITHOUT CONTRAST TECHNIQUE: Contiguous axial images were obtained from the base of the skull through the vertex without intravenous contrast. RADIATION DOSE REDUCTION: This exam was performed according to the departmental dose-optimization program which includes automated exposure control, adjustment of the mA and/or kV according to patient size and/or use of iterative reconstruction technique. COMPARISON:  04/28/2023 FINDINGS: Brain: No evidence of acute infarction, hemorrhage, hydrocephalus, extra-axial collection or mass lesion/mass effect. Subcortical white matter and periventricular small vessel ischemic changes. Old left parietal infarct (series 2/image 25). Old lacunar infarct in the left corona radiata (series 2/image 17). Vascular: Intracranial atherosclerosis. Skull: Normal. Negative for fracture or focal lesion. Sinuses/Orbits: The visualized paranasal sinuses are essentially clear. The mastoid air cells are unopacified. Other: None. IMPRESSION: No acute intracranial abnormality. Old left parietal and corona radiata infarcts. Small vessel ischemic changes. Electronically Signed   By: Charline Bills M.D.   On: 05/02/2023 20:39   MR BRAIN WO CONTRAST  Result Date: 04/28/2023 CLINICAL DATA:  Neuro deficit, acute, stroke suspected EXAM: MRI HEAD WITHOUT CONTRAST TECHNIQUE: Multiplanar, multiecho pulse sequences of the brain and surrounding structures were obtained without intravenous contrast. COMPARISON:  Same-day CT head, MR head 04/26/2023  FINDINGS: Brain: Negative for an acute infarct. No hemorrhage. No hydrocephalus. No extra-axial fluid collection. Sequela of mild chronic microvascular ischemic change. Chronic infarcts in the left parietal lobe. Vascular: Normal flow voids. Skull and upper cervical spine: Normal marrow signal. Sinuses/Orbits: Small bilateral mastoid effusions. Paranasal sinuses are notable for mucosal thickening in the bilateral maxillary sinuses. Bilateral lens replacement. Orbits are otherwise unremarkable. Other: None. IMPRESSION: No acute intracranial process. Electronically Signed   By: Lorenza Cambridge M.D.   On: 04/28/2023 18:07   CT HEAD WO CONTRAST  Result Date: 04/28/2023 CLINICAL DATA:  Transient ischemic attack (TIA) EXAM: CT HEAD WITHOUT CONTRAST TECHNIQUE: Contiguous axial images were obtained from the base of the skull through the vertex without intravenous contrast. RADIATION DOSE REDUCTION: This exam was performed according to the departmental dose-optimization program which includes automated exposure control, adjustment of the mA and/or kV according to patient size and/or use of iterative reconstruction technique. COMPARISON:  MR  Head 04/26/23 FINDINGS: Brain: No evidence of CT evidence of an acute cortical infarction, hemorrhage, hydrocephalus, extra-axial collection or mass lesion/mass effect. Sequela of moderate chronic microvascular ischemic change. Chronic infarct in the corona radiata on the left. Chronic infarct in the left parietal lobe. Enlarged and partially empty sella. Vascular: No hyperdense vessel or unexpected calcification. Skull: Normal. Negative for fracture or focal lesion. Sinuses/Orbits: No middle ear or mastoid effusion. Paranasal clear. Lens replacement. Orbits are otherwise unremarkable. Other: None. IMPRESSION: 1. No CT evidence of an acute cortical infarct. 2. Sequela of moderate chronic microvascular ischemic change. Chronic infarcts in the left corona radiata and left parietal lobe.  Electronically Signed   By: Lorenza Cambridge M.D.   On: 04/28/2023 11:25   MR BRAIN WO CONTRAST  Result Date: 04/26/2023 CLINICAL DATA:  Neuro deficit, acute, stroke suspected EXAM: MRI HEAD WITHOUT CONTRAST TECHNIQUE: Multiplanar, multiecho pulse sequences of the brain and surrounding structures were obtained without intravenous contrast. COMPARISON:  CT head from today.  MRI head February 22, 2023. FINDINGS: Limited stroke protocol which includes axial DWI/ADC, FLAIR and sagittal T1. Within this limitation: Brain: No evidence of acute infarct, mass lesion, acute hemorrhage, midline shift or hydrocephalus. Remote infarcts in the posterior left MCA/ACA watershed territory. Vascular: Better assessed on same day CTA. Skull and upper cervical spine: Normal marrow signal. Sinuses/Orbits: Clear sinuses.  No acute orbital findings. Other: Trace bilateral mastoid effusions. IMPRESSION: 1. No acute infarct. 2. Remote left MCA/ACA watershed territory infarcts. Electronically Signed   By: Feliberto Harts M.D.   On: 04/26/2023 15:56   CT ANGIO HEAD NECK W WO CM (CODE STROKE)  Result Date: 04/26/2023 CLINICAL DATA:  Post stroke, placement of left carotid stent today EXAM: CT ANGIOGRAPHY HEAD AND NECK WITH AND WITHOUT CONTRAST TECHNIQUE: Multidetector CT imaging of the head and neck was performed using the standard protocol during bolus administration of intravenous contrast. Multiplanar CT image reconstructions and MIPs were obtained to evaluate the vascular anatomy. Carotid stenosis measurements (when applicable) are obtained utilizing NASCET criteria, using the distal internal carotid diameter as the denominator. RADIATION DOSE REDUCTION: This exam was performed according to the departmental dose-optimization program which includes automated exposure control, adjustment of the mA and/or kV according to patient size and/or use of iterative reconstruction technique. CONTRAST:  75mL OMNIPAQUE IOHEXOL 350 MG/ML SOLN  COMPARISON:  02/22/2023 CTA head and neck, correlation is also made with 04/26/2023 CT head FINDINGS: CT HEAD FINDINGS For noncontrast findings, please see same day CT head. CTA NECK FINDINGS Aortic arch: Standard branching. Imaged portion shows no evidence of aneurysm or dissection. No significant stenosis of the major arch vessel origins. Aortic atherosclerosis. Right carotid system: Redemonstrated atherosclerotic disease in the distal common carotid, at the bifurcation, and in the proximal internal carotid, which causes up to 60% stenosis in the proximal ICA, unchanged from the prior exam. Left carotid system: Interval placement of a stent in the distal left common and proximal left internal carotid artery. Beam hardening artifact from the stent, as well as narrowing of the lumen in the proximal left ICA segment (series 5, image 343 and series 8, image 104) limit evaluation for in-stent thrombus; however, as contrast is seen at the proximal and distal ends, it is presumed to be patent. Vertebral arteries: Mild narrowing of the left V2 (series 5, image 373), secondary to calcified and noncalcified plaque. The left vertebral artery is otherwise patent skull base. Severe narrowing of the right V2 secondary to osteophytes/degenerative changes (series 5, image  382). Left dominant system. No evidence of dissection. Skeleton: No acute osseous abnormality. Advanced degenerative changes in the cervical spine. Status post median sternotomy. Other neck: Redemonstrated right thyroid nodule, which was previously evaluated with ultrasound on 03/19/2022. Otherwise negative. Upper chest: No new focal pulmonary opacity or pleural effusion. Review of the MIP images confirms the above findings CTA HEAD FINDINGS Anterior circulation: Both internal carotid arteries are patent to the termini, with moderate stenosis in the left supraclinoid segment. A1 segments patent. Normal anterior communicating artery. Anterior cerebral arteries  are patent to their distal aspects without significant stenosis. No M1 significant stenosis or occlusion. MCA branches perfused to their distal aspects without significant stenosis. Posterior circulation: The right vertebral artery primarily supplies the right PICA. The left vertebral artery is patent to the vertebrobasilar junction with moderate stenosis proximally, unchanged. Posterior inferior cerebellar arteries patent proximally. Basilar patent to its distal aspect without significant stenosis. Superior cerebellar arteries patent proximally. Patent P1 segments. Moderate stenosis in the proximal left P2 segment. PCAs otherwise perfused to their distal aspects without significant stenosis. The bilateral posterior communicating arteries are not visualized. Venous sinuses: As permitted by contrast timing, patent. Anatomic variants: None significant. Review of the MIP images confirms the above findings IMPRESSION: 1. Interval placement of a stent in the distal left common and proximal left internal carotid artery, with beam hardening artifact limiting evaluation for in-stent thrombus; however, as contrast is seen at the proximal and distal ends, it is presumed to be patent. 2. 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 3. Severe narrowing in the right V2, mild narrowing in the left V2, and moderate stenosis in the left supraclinoid ICA and left P2 segment. 4. No intracranial large vessel occlusion. 5. Aortic atherosclerosis. Aortic Atherosclerosis (ICD10-I70.0). These findings were discussed by telephone on 04/26/2023 at 3:40 pm with provider ASHISH ARORA . Electronically Signed   By: Wiliam Ke M.D.   On: 04/26/2023 15:51   CT HEAD CODE STROKE WO CONTRAST  Result Date: 04/26/2023 CLINICAL DATA:  Code stroke. Neuro deficit, acute, stroke suspected. Placement of left carotid stent today. EXAM: CT HEAD WITHOUT CONTRAST TECHNIQUE: Contiguous axial images were obtained from the base of the skull through the  vertex without intravenous contrast. RADIATION DOSE REDUCTION: This exam was performed according to the departmental dose-optimization program which includes automated exposure control, adjustment of the mA and/or kV according to patient size and/or use of iterative reconstruction technique. COMPARISON:  CT head without contrast 02/23/2023. MR head without contrast 02/22/2023 FINDINGS: Brain: Remote infarct of the left parietal lobe is again seen on image 25 of series 3. Mild generalized atrophy and white matter disease is otherwise stable. No acute infarct, hemorrhage, or mass lesion is present. The ventricles are of normal size. No significant extraaxial fluid collection is present. The brainstem and cerebellum are within normal limits. Midline structures are within normal limits. Vascular: Atherosclerotic calcifications are present at the cavernous internal carotid arteries bilaterally and dural margin of the left vertebral artery. No hyperdense vessel present. Skull: Calvarium is intact. No focal lytic or blastic lesions are present. No significant extracranial soft tissue lesion is present. Sinuses/Orbits: The paranasal sinuses and mastoid air cells are clear. Bilateral lens replacements are noted. Globes and orbits are otherwise unremarkable. ASPECTS [facility] Stroke Program Early CT Score) - Ganglionic level infarction (caudate, lentiform nuclei, internal capsule, insula, M1-M3 cortex): 7/7 - Supraganglionic infarction (M4-M6 cortex): 3/3 Total score (0-10 with 10 being normal): 10/10 IMPRESSION: 1. No acute intracranial abnormality  or significant interval change. 2. Remote infarct of the left parietal lobe. 3. Stable atrophy and white matter disease. This likely reflects the sequela of chronic microvascular ischemia. 4. Aspects is 10/10. The above was relayed via text pager to Dr. Milon Dikes on 04/26/2023 at 15:21. Electronically Signed   By: Marin Roberts M.D.   On: 04/26/2023 15:23   PERIPHERAL  VASCULAR CATHETERIZATION  Result Date: 04/26/2023 See surgical note for result.  Labs: BNP (last 3 results) No results for input(s): "BNP" in the last 8760 hours. Basic Metabolic Panel: Recent Labs  Lab 04/27/23 0651 04/28/23 1009 05/02/23 2014  NA 138 138 137  K 4.0 4.0 4.0  CL 104 105 102  CO2 25 23 26   GLUCOSE 141* 130* 95  BUN 10 15 21   CREATININE 0.88 0.97 1.01  CALCIUM 8.9 9.3 9.2   Liver Function Tests: Recent Labs  Lab 04/28/23 1009 05/02/23 2014  AST 23 23  ALT 12 15  ALKPHOS 55 51  BILITOT 1.0 0.6  PROT 7.3 7.4  ALBUMIN 4.3 4.3   No results for input(s): "LIPASE", "AMYLASE" in the last 168 hours. No results for input(s): "AMMONIA" in the last 168 hours. CBC: Recent Labs  Lab 04/27/23 0651 04/28/23 1009 05/02/23 2014  WBC 6.9 7.1 7.0  NEUTROABS  --  3.8 2.8  HGB 14.1 14.3 13.2  HCT 42.1 43.1 40.1  MCV 96.3 97.3 99.0  PLT 163 172 159   Recent Labs  Lab 05/02/23 2323 05/03/23 0748 05/03/23 1208 05/03/23 1540  GLUCAP 112* 109* 113* 126*   Recent Labs    05/03/23 0528  CHOL 115  HDL 42  LDLCALC 51  TRIG 111  CHOLHDL 2.7      Component Value Date/Time   COLORURINE YELLOW (A) 05/03/2023 0130   APPEARANCEUR CLEAR (A) 05/03/2023 0130   LABSPEC 1.013 05/03/2023 0130   PHURINE 5.0 05/03/2023 0130   GLUCOSEU NEGATIVE 05/03/2023 0130   GLUCOSEU NEGATIVE 02/28/2018 0833   HGBUR NEGATIVE 05/03/2023 0130   BILIRUBINUR NEGATIVE 05/03/2023 0130   KETONESUR NEGATIVE 05/03/2023 0130   PROTEINUR NEGATIVE 05/03/2023 0130   UROBILINOGEN 0.2 02/28/2018 0833   NITRITE NEGATIVE 05/03/2023 0130   LEUKOCYTESUR NEGATIVE 05/03/2023 0130  Sepsis Labs Recent Labs  Lab 04/27/23 0651 04/28/23 1009 05/02/23 2014  WBC 6.9 7.1 7.0  Microbiology No results found for this or any previous visit (from the past 240 hour(s)). Time coordinating discharge: 25 minutes  SIGNED: Lanae Boast, MD  [facility] 05/03/2023, 5:02 PM  If 7PM-7AM, please contact  night-coverage [email or web address]

## 2023-05-03 NOTE — Plan of Care (Signed)
  Problem: Education: Goal: Knowledge of disease or condition will improve Outcome: Progressing   Problem: Ischemic Stroke/TIA Tissue Perfusion: Goal: Complications of ischemic stroke/TIA will be minimized Outcome: Progressing   Problem: Coping: Goal: Will verbalize positive feelings about self Outcome: Progressing   Problem: Health Behavior/Discharge Planning: Goal: Ability to manage health-related needs will improve Outcome: Progressing   Problem: Nutrition: Goal: Risk of aspiration will decrease Outcome: Progressing   Problem: Education: Goal: Knowledge of General Education information will improve Description: Including pain rating scale, medication(s)/side effects and non-pharmacologic comfort measures Outcome: Progressing   Problem: Health Behavior/Discharge Planning: Goal: Ability to manage health-related needs will improve Outcome: Progressing   Problem: Activity: Goal: Risk for activity intolerance will decrease Outcome: Progressing   Problem: Nutrition: Goal: Adequate nutrition will be maintained Outcome: Progressing   Problem: Elimination: Goal: Will not experience complications related to bowel motility Outcome: Progressing   Problem: Pain Managment: Goal: General experience of comfort will improve Outcome: Progressing

## 2023-05-03 NOTE — Progress Notes (Signed)
Patient being discharged home. All belongings sent with patient. All discharged education provided and questions answered. IV removed.

## 2023-05-03 NOTE — Telephone Encounter (Signed)
Pt's sgo would like to discuss the patient's medical results since being seen in the hospital on 6/24. Please call and advise. Thank you

## 2023-05-03 NOTE — Progress Notes (Signed)
Eeg done 

## 2023-05-03 NOTE — TOC CM/SW Note (Signed)
Transition of Care [facility]) - Inpatient Brief Assessment   Patient Details  Name: Javone Pistone MRN: [id number] Date of Birth: [birth year]  Transition of Care [facility]) CM/SW Contact:    Allena Katz, LCSW Phone Number: 05/03/2023, 4:00 PM   Clinical Narrative:    Transition of Care Asessment: Insurance and Status: Insurance coverage has been reviewed Patient has primary care physician: Yes Home environment has been reviewed: [address] [place] [id number]- Prior level of function:: active with outpatient PT and wants to resume Prior/Current Home Services: No current home services Social Determinants of Health Reivew: SDOH reviewed no interventions necessary Readmission risk has been reviewed: Yes Transition of care needs: no transition of care needs at this time

## 2023-05-03 NOTE — Progress Notes (Signed)
OT Cancellation Note  Patient Details Name: Shannon Chung MRN: [id number] DOB: 01-25-1941   Cancelled Treatment:    Reason Eval/Treat Not Completed: OT screened, no needs identified, will sign off. Per PT report, pt has returned to baseline for functional mobility and self care needs. OT to complete orders at this time.   Jackquline Denmark, MS, OTR/L , CBIS ascom [phone]  05/03/23, 12:48 PM

## 2023-05-03 NOTE — Telephone Encounter (Signed)
Patient's sig other called to ask about the results of the ZIO heart monitor from 03/22/23 due to patient's recent hospitalization. Read the results to the patient's sig other as follows: "Predominantly normal rhythm with brief runs of extra beats from the top and bottom chambers but no significant arrhythmias, prolonged pauses, or evidence of atrial fibrillation/flutter." Caller was satisfied with the results

## 2023-05-03 NOTE — Evaluation (Signed)
Speech Language Pathology Evaluation Patient Details Name: Shannon Chung MRN: [id number] DOB: January 02, 1941 Today's Date: 05/03/2023 Time: 4098-1191 SLP Time Calculation (min) (ACUTE ONLY): 25 min  Problem List:  Patient Active Problem List   Diagnosis Date Noted   TIA (transient ischemic attack) 05/02/2023   GERD without esophagitis 05/02/2023   Carotid stenosis, symptomatic, with infarction (HCC) 04/26/2023   History of CVA (cerebrovascular accident) without residual deficits 03/04/2023   Acute CVA (cerebrovascular accident) (HCC) 02/22/2023   Chronic radicular pain of lower back 08/10/2022   Cervical spondylosis 03/11/2022   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   Bilateral carotid artery stenosis 03/11/2022   Lumbar spondylosis 10/07/2021   DDD (degenerative disc disease), lumbar 10/07/2021   History of radiation therapy 08/26/2021   Aortic atherosclerosis (HCC) 04/17/2021   Diverticulosis 04/17/2021   Hypertension associated with diabetes (HCC) 10/07/2020   Overweight (BMI 25.0-29.9) 10/07/2020   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   Vitamin D deficiency 08/01/2020   Polyp of sigmoid colon 08/01/2020   Insomnia 06/07/2020   Gastroesophageal reflux disease 04/04/2020   Degenerative joint disease of hand 03/01/2020   BPH (benign prostatic hyperplasia) 08/05/2018   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   Macular scar of both eyes 04/20/2018   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   Scotoma involving central area of both eyes 04/20/2018   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   Fatty liver 03/25/2018   Erectile dysfunction 02/23/2017   Hx of CABG 01/24/2017   Advanced care planning/counseling discussion 10/21/2015   Coronary artery disease of native artery of native heart with stable angina pectoris (HCC)    DM2 (diabetes mellitus, type 2) (HCC) 08/12/2015   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   Dyslipidemia 01/02/2015   Essential hypertension 01/02/2015    Osteoarthritis 01/02/2015   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 06/2015 Cardiac CT: Ca score 1103 (84th %'ile);  b. 07/2015 Cath: LM 70, LAD 80p, 100/84m, D1 70, D2 95, RI 75, RCA 100p/m;  c. 07/2015 CABG x 5 (LIMA->LAD, VG->Diag, VG->OM1->OM2, VG->OM3).   COVID-19    12/2021   COVID-19 01/18/2022   COVID-19 vaccine administered 01/18/2022   Unknown how many vaccine doses have been received. Entered from Emergency Triage Note.   Diabetes mellitus without complication (HCC) 07/2015   Dyslipidemia    Essential hypertension    Essential hypertension 01/02/2015   Formatting of this note might be different from the original.  Last Assessment & Plan:   Chronic, stable. Continue current regimen.   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 dilated, EF 35% improved to 45-50% (2015);  b. 07/2015 EF 25-35% by LV gram.   Ischemic cardiomyopathy 01/02/2015   Kidney stones 04/17/2021   Lone atrial fibrillation (HCC) 1983   a. isolated episode, not on OAC.   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 06/2014: LV; resolved with coumadin-->no residual on f/u echo, no longer on coumadin.   Mural thrombus of heart 08/26/2021   Formatting of this note might be different from the original. Jun 16, 2019 Entered By: [name] Comment: left ventricle, cardiac apex   Osteoarthritis    a. 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 Jun 16, 2019 Entered By: [name] Comment: leftAug 19, 2020 Entered By: [name] Comment: resolved by total left knee replacement Jun 16, 2019 Entered By: [name] Comment: leftAug 19, 2020 Entered By: [name] Comment: resolved by total left knee replacement   Trigger finger of left hand 07/07/2019   Vitamin D deficiency    Past Surgical History:  Past Surgical History:  Procedure Laterality Date   BICEPS TENDON REPAIR  Right 1993   CARDIAC CATHETERIZATION N/A 07/05/2015   Procedure: Left Heart Cath and Coronary Angiography;  Surgeon: Antonieta Iba, MD;  Location: [facility] INVASIVE CV LAB;  Service: Cardiovascular;  Laterality: N/A;   CAROTID PTA/STENT INTERVENTION Left 04/26/2023   Procedure: CAROTID PTA/STENT INTERVENTION;  Surgeon: Annice Needy, MD;  Location: [facility] INVASIVE CV LAB;  Service: Cardiovascular;  Laterality: Left;   CATARACT EXTRACTION Left 12/2016   with keratoplasty   COLONOSCOPY  2007   COLONOSCOPY WITH PROPOFOL N/A 12/02/2017   TA, SSA, rpt 3 yrs(Tahiliani, Varnita B, MD)   COLONOSCOPY WITH PROPOFOL N/A 11/26/2020   Procedure: COLONOSCOPY WITH PROPOFOL;  Surgeon: Midge Minium, MD;  Location: [facility] ENDOSCOPY;  Service: Endoscopy;  Laterality: N/A;   CORONARY ARTERY BYPASS GRAFT N/A 07/29/2015   Procedure: CORONARY ARTERY BYPASS GRAFTING (CABG) x 5 (LIMA to LAD, SVG to DIAGONAL,  SVG SEQUENTIALLY to OM1 and OM2, SVG to OM3) with Endoscopic Vein Havesting of  GREATER SAPHENOUS VEIN from RIGHT THIGH and partial LOWER LEG ;  Surgeon: Alleen Borne, MD;  Location: [facility] OR;  Service: Open Heart Surgery;  Laterality: N/A;   EYE SURGERY  b/l cataract and cornea replaced    HAND SURGERY     left hand 1st/2nd trigger fingers Dr. Hyacinth Meeker ortho    JOINT REPLACEMENT     KNEE ARTHROSCOPY Left remote   MOHS SURGERY     left cheek scc 2022 Dr. Jeannine Boga   MOHS SURGERY     x 5 facial scc   right biceps tendon     repair/re attachment    SKIN CANCER EXCISION  10/2015   BCC - L ala (pending MOHs) and L scapula (complete excision)   TEE WITHOUT CARDIOVERSION N/A 07/29/2015   Procedure: TRANSESOPHAGEAL ECHOCARDIOGRAM (TEE);  Surgeon: Alleen Borne, MD;  Location[facility] OR;  Service: Open Heart Surgery;  Laterality: N/A;   TONSILLECTOMY  1949   TOTAL KNEE ARTHROPLASTY Left 03/18/2016   cemented L TKR; Deeann Saint, MD   HPI:  Per H&P "Shannon Chung is a 82 y.o. Caucasian male with medical history significant for  history of CVA in April 2024 s/p left carotid artery stent by a week ago, anxiety, depression, coronary artery disease s/p CABG X5, dyslipidemia, hypertension, GERD, who presented to emergency room with acute onset of expressive dysphasia that lasted about 10 minutes per his wife today.  This has happened about 4 more times this week.  He was seen for it on Wednesday here and workup was negative.  His wife stated that she was told to come if he has any recurrence.  He has been compliant with his aspirin and Plavix.  No paresthesias or focal muscle weakness.  No dysphagia.  No urinary or stool incontinence.  No tinnitus or vertigo.  No fever or chills.  No chest pain or palpitations.  No cough or wheezing or dyspnea.  No dysuria, oliguria or hematuria or flank pain.  The patient had diarrhea yesterday with loose bowel movements that resolved today.  No fever or chills.  No nausea or vomiting or abdominal pain." MRI 05/02/23 "No acute intracranial process. No evidence of acute or subacute  infarct."   Assessment / Plan / Recommendation Clinical Impression  Pt seen for cognitive-communication evaluation. Evaluation completed via informal means and SLUMS. Pt scored 21/30 on SLUMS. Noted difficult with attention and memory (immediate, short term, working). Pt with subjecitve reports of feeling "foggy." Pt's basic primary speech/language ability is judged to be [facility]. Pt would benefit from f/u SLP services acute and post-acute for further assessment of functional cognitive-linguistic ability. Pt made aware of results, recommendations, and SLP POC. Pt verbalized understanding/agreement.     SLP Assessment  SLP Recommendation/Assessment: Patient needs continued Speech Lanaguage Pathology Services SLP Visit Diagnosis: Cognitive communication deficit (R41.841)    Recommendations for follow up therapy are one component of a multi-disciplinary discharge planning process, led by the attending physician.  Recommendations  may be updated based on patient status, additional functional criteria and insurance authorization.    Follow Up Recommendations  Outpatient SLP    Assistance Recommended at Discharge  Intermittent Supervision/Assistance  Functional Status Assessment Patient has had a recent decline in their functional status and demonstrates the ability to make significant improvements in function in a reasonable and predictable amount of time.  Frequency and Duration min 2x/week  2 weeks      SLP Evaluation Cognition  Overall Cognitive Status: Impaired/Different from baseline Arousal/Alertness: Awake/alert Orientation Level: Oriented X4 Memory: Impaired Memory Impairment: Storage deficit;Retrieval deficit;Decreased recall of new information;Decreased short term memory Decreased Short Term Memory: Verbal basic Awareness: Appears intact Problem Solving: Appears intact Executive Function:  Self Correcting;Sequencing Sequencing: Appears intact Self Correcting: Appears intact Safety/Judgment: Appears intact       Comprehension  Auditory Comprehension Overall Auditory Comprehension: Appears within functional limits for tasks assessed Yes/No Questions: Within Functional Limits Commands: Within Functional Limits Visual Recognition/Discrimination Discrimination: Not tested Reading Comprehension Reading Status: Not tested    Expression Expression Primary Mode of Expression: Verbal Verbal Expression Overall Verbal Expression: Appears within functional limits for tasks assessed Initiation: No impairment Automatic Speech: Name;Social Response Level of Generative/Spontaneous Verbalization: Sentence;Conversation Repetition: No impairment Naming: No impairment Written Expression Dominant Hand: Right Written Expression:  (WFL for clockdrawing)   Oral / Motor  Oral Motor/Sensory Function Overall Oral Motor/Sensory Function: Within functional limits Motor Speech Respiration: Within functional  limits Phonation: Normal Resonance: Within functional limits Articulation: Within functional limitis Intelligibility: Intelligible Motor Planning: Witnin functional limits           Clyde Canterbury, M.S., CCC-SLP Speech-Language Pathologist [place] [facility] [phone] (ASCOM)  Woodroe Chen 05/03/2023, 9:55 AM

## 2023-05-04 ENCOUNTER — Ambulatory Visit: Payer: Medicare Other | Attending: Family Medicine

## 2023-05-04 ENCOUNTER — Telehealth: Payer: Self-pay

## 2023-05-04 DIAGNOSIS — R269 Unspecified abnormalities of gait and mobility: Secondary | ICD-10-CM | POA: Insufficient documentation

## 2023-05-04 DIAGNOSIS — M6281 Muscle weakness (generalized): Secondary | ICD-10-CM | POA: Insufficient documentation

## 2023-05-04 DIAGNOSIS — M545 Low back pain, unspecified: Secondary | ICD-10-CM | POA: Diagnosis present

## 2023-05-04 DIAGNOSIS — M25552 Pain in left hip: Secondary | ICD-10-CM | POA: Insufficient documentation

## 2023-05-04 NOTE — Therapy (Signed)
OUTPATIENT PHYSICAL THERAPY NEURO TREATMENT/Re-eval   Patient Name: Shannon Chung MRN: [id number] DOB:[birth year], 82 y.o., male Today's Date: 05/04/2023   PCP: Dana Allan, MD  REFERRING PROVIDER:   Dana Allan, MD    END OF SESSION:  PT End of Session - 05/04/23 0855     Visit Number 8    Number of Visits 16    Date for PT Re-Evaluation 05/05/23    Progress Note Due on Visit 10    PT Start Time 0845    PT Stop Time 0928    PT Time Calculation (min) 43 min    Equipment Utilized During Treatment Gait belt    Activity Tolerance Patient tolerated treatment well    Behavior During Therapy [facility] for tasks assessed/performed                  Past Medical History:  Diagnosis Date   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 06/2015 Cardiac CT: Ca score 1103 (84th %'ile);  b. 07/2015 Cath: LM 70, LAD 80p, 100/50m, D1 70, D2 95, RI 75, RCA 100p/m;  c. 07/2015 CABG x 5 (LIMA->LAD, VG->Diag, VG->OM1->OM2, VG->OM3).   COVID-19    12/2021   COVID-19 01/18/2022   COVID-19 vaccine administered 01/18/2022   Unknown how many vaccine doses have been received. Entered from Emergency Triage Note.   Diabetes mellitus without complication (HCC) 07/2015   Dyslipidemia    Essential hypertension    Essential hypertension 01/02/2015   Formatting of this note might be different from the original.  Last Assessment & Plan:   Chronic, stable. Continue current regimen.   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 dilated, EF 35% improved to 45-50% (2015);  b. 07/2015 EF 25-35% by LV gram.   Ischemic cardiomyopathy 01/02/2015   Kidney stones 04/17/2021   Lone atrial fibrillation (HCC) 1983   a. isolated episode, not on OAC.   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 06/2014: LV; resolved with coumadin-->no residual on f/u echo, no longer on coumadin.   Mural thrombus of heart 08/26/2021   Formatting of this note might be different from the original. Jun 16, 2019 Entered By:  HICKS,KRISTIN DENISE Comment: left ventricle, cardiac apex   Osteoarthritis    a. 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 Jun 16, 2019 Entered By: [name] Comment: leftAug 19, 2020 Entered By: [name] Comment: resolved by total left knee replacement Jun 16, 2019 Entered By: [name] Comment: leftAug 19, 2020 Entered By: [name] Comment: resolved by total left knee replacement   Trigger finger of left hand 07/07/2019   Vitamin D deficiency    Past Surgical History:  Procedure Laterality Date   BICEPS TENDON REPAIR Right 1993   CARDIAC CATHETERIZATION N/A 07/05/2015   Procedure: Left Heart Cath and Coronary Angiography;  Surgeon: Antonieta Iba, MD;  Location: [facility] INVASIVE CV LAB;  Service: Cardiovascular;  Laterality: N/A;   CAROTID PTA/STENT INTERVENTION Left 04/26/2023   Procedure: CAROTID PTA/STENT INTERVENTION;  Surgeon: Annice Needy, MD;  Location: [facility] INVASIVE CV LAB;  Service: Cardiovascular;  Laterality: Left;   CATARACT EXTRACTION Left 12/2016   with keratoplasty   COLONOSCOPY  2007   COLONOSCOPY WITH PROPOFOL N/A 12/02/2017   TA, SSA, rpt 3 yrs(Tahiliani, Varnita B, MD)   COLONOSCOPY WITH PROPOFOL N/A 11/26/2020   Procedure: COLONOSCOPY WITH PROPOFOL;  Surgeon: Midge Minium, MD;  Location: [facility] ENDOSCOPY;  Service: Endoscopy;  Laterality: N/A;   CORONARY ARTERY BYPASS GRAFT N/A 07/29/2015   Procedure: CORONARY ARTERY BYPASS GRAFTING (CABG) x 5 (LIMA to LAD, SVG to DIAGONAL,  SVG SEQUENTIALLY to OM1 and OM2, SVG to OM3) with Endoscopic Vein Havesting of  GREATER SAPHENOUS VEIN from RIGHT THIGH and partial LOWER LEG ;  Surgeon: Alleen Borne, MD;  Location: [facility] OR;  Service: Open Heart Surgery;  Laterality: N/A;   EYE SURGERY     b/l cataract and cornea replaced    HAND SURGERY     left hand 1st/2nd trigger fingers Dr. Hyacinth Meeker ortho    JOINT REPLACEMENT     KNEE ARTHROSCOPY Left remote   MOHS SURGERY     left cheek scc 2022 Dr. Jeannine Boga    MOHS SURGERY     x 5 facial scc   right biceps tendon     repair/re attachment    SKIN CANCER EXCISION  10/2015   BCC - L ala (pending MOHs) and L scapula (complete excision)   TEE WITHOUT CARDIOVERSION N/A 07/29/2015   Procedure: TRANSESOPHAGEAL ECHOCARDIOGRAM (TEE);  Surgeon: Alleen Borne, MD;  Location: [facility] OR;  Service: Open Heart Surgery;  Laterality: N/A;   TONSILLECTOMY  1949   TOTAL KNEE ARTHROPLASTY Left 03/18/2016   cemented L TKR; Deeann Saint, MD   Patient Active Problem List   Diagnosis Date Noted   TIA (transient ischemic attack) 05/02/2023   GERD  without esophagitis 05/02/2023   Carotid stenosis, symptomatic, with infarction (HCC) 04/26/2023   History of CVA (cerebrovascular accident) without residual deficits 03/04/2023   Acute CVA (cerebrovascular accident) (HCC) 02/22/2023   Chronic radicular pain of lower back 08/10/2022   Cervical spondylosis 03/11/2022   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   Bilateral carotid artery stenosis 03/11/2022   Lumbar spondylosis 10/07/2021   DDD (degenerative disc disease), lumbar 10/07/2021   History of radiation therapy 08/26/2021   Aortic atherosclerosis (HCC) 04/17/2021   Diverticulosis 04/17/2021   Hypertension associated with diabetes (HCC) 10/07/2020   Overweight (BMI 25.0-29.9) 10/07/2020   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   Vitamin D deficiency 08/01/2020   Polyp of sigmoid colon 08/01/2020   Insomnia 06/07/2020   Gastroesophageal reflux disease 04/04/2020   Degenerative joint disease of hand 03/01/2020   BPH (benign prostatic hyperplasia) 08/05/2018   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   Macular scar of both eyes 04/20/2018   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   Scotoma involving central area of both eyes 04/20/2018   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   Fatty liver 03/25/2018   Erectile dysfunction 02/23/2017   Hx of CABG 01/24/2017   Advanced care planning/counseling discussion 10/21/2015    Coronary artery disease of native artery of native heart with stable angina pectoris (HCC)    DM2 (diabetes mellitus, type 2) (HCC) 08/12/2015   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   Dyslipidemia 01/02/2015   Essential hypertension 01/02/2015   Osteoarthritis 01/02/2015   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/02/2014    ONSET DATE: 02/22/23  REFERRING DIAG: Z86.73 (ICD-10-CM) - History of CVA (cerebrovascular accident)   THERAPY DIAG:  Abnormality of gait and mobility  Muscle weakness (generalized)  Pain in left hip  Bilateral low back pain without sciatica, unspecified chronicity  Rationale for Evaluation and Treatment: Rehabilitation  SUBJECTIVE:                                                                                                                                                                                             SUBJECTIVE STATEMENT: Patient reports feeling drained. Reports he was in ED 3x/last week. States he had symptoms of TIA and states not up to doing anything too exhausting but agreeable to learning some basic seated LE strengthening.  Per hospital report from 05/03/2023:  Discharge Diagnoses:  Principal Problem:   TIA (transient ischemic attack) Active Problems:   Essential hypertension   Dyslipidemia   GERD without esophagitis   TIA with recurrent expressive aphasia symptoms: Symptoms have been recurrent happen 4 times with resolution.  Workup with CT head MRI brain no acute finding, CTA head and neck  reviewed from 6/24 with a S/P stent in distal left common and proximal internal carotid artery presumed to patent, and 60% stenosis in the proximal right ICA, severe narrowing in the right V2 and left V2. 2D echo from recent admission EF 50% G1 DD.  PT OT ST consulted and neurology consulte. Continue patient's home aspirin ( he is on 162mg ) , Lipitor, Plavix, and Zetia.  Neurology planning for outpatient loop recorder for further evaluation.  An EEG p done  -reported as  :This study is suggestive of cortical dysfunction arising from left temporal region, non specific etiology.No seizures or epileptiform discharges were seen throughout the recording.  Per neuro:  "Findings not clearly ictal but given remote cortical infarcts, frequency of events and the fact that the EEG is not normal, I do feel risks of antiseizure meds are outweighed by benefits.  - Keppra 500 mg BID - Include seizure precautions in discharge instructions"  And informed not to drive for 6 months.   Standard seizure precautions added as below: Per [facility] statutes, patients with seizures are not allowed to drive until  they have been seizure-free for six months. Use caution when using heavy equipment or power tools. Avoid working on ladders or at heights. Take showers instead of baths. Ensure the water temperature is not too high on the home water heater. Do not go swimming alone. When caring for infants or small children, sit down when holding, feeding, or changing them to minimize risk of injury to the child in the event you have a seizure.  To reduce risk of seizures, maintain good sleep hygiene avoid alcohol and illicit drug use, take all anti-seizure medications as prescribed.   Hypertension:BP controlled on lisinopril and metoprolol Hyperlipidemia:LDL at goal 51,continue home Lipitor 40 mg       Pt accompanied by: self  PERTINENT HISTORY: From Neurology 03/10/23 visit: Patient with past medical history significant for CAD (CABG times 03/22/2015), carotid stenosis, hypertension, hyperlipidemia, T2DM, history of CVA. He presented to the ED 02/22/2023 with acute onset right-sided weakness, he received TNK. Patient has no residual symptoms, he presents to establish care with our office. Results of cardiac monitor are pending, he is well-established with cardiology. Patient was taking aspirin 81 mg at the time of presentation to the ED.  Reviewed and discussed results from  recent hospitalization including provider notes, CTA head and neck, echocardiogram, CT, MRI brain. Results below. CTA head and neck did reveal left ICA stenosis of 70%, right ICA stenosis of 60%.   PAIN:  Are you having pain? Yes- Right knee- did not rate- states worse at time with bending/steps  PRECAUTIONS: None  WEIGHT BEARING RESTRICTIONS: No  FALLS: Has patient fallen in last 6 months? No  LIVING ENVIRONMENT: Lives with: lives with their family and lives with their spouse Lives in: House/apartment Stairs: No Has following equipment at home: None  PLOF: Independent  PATIENT GOALS: To ensure he is physically at the level he was prior to the stroke in April.   OBJECTIVE:   DIAGNOSTIC FINDINGS: no acute findings from CVA  COGNITION: Overall cognitive status: Within functional limits for tasks assessed   SENSATION: Not tested  COORDINATION: WNL  POSTURE: No Significant postural limitations  LOWER EXTREMITY ROM: WNL  LOWER EXTREMITY MMT:    MMT Right Eval Left Eval  Hip flexion 4 4+  Hip extension    Hip abduction 5 5  Hip adduction 5 5  Hip internal rotation 5 5  Hip external rotation  5 5  Knee flexion 4+ 4+  Knee extension 4+ 5  Ankle dorsiflexion 5 5  Ankle plantarflexion    Ankle inversion    Ankle eversion    (Blank rows = not tested)  BED MOBILITY:  WNL  TRANSFERS: Assistive device utilized: None  Sit to stand: Complete Independence Stand to sit: Complete Independence Chair to chair: Complete Independence Floor:  not tested     CURB:   Curb Comments: Reports hesitancy with curb   STAIRS: Level of Assistance: Modified independence Stair Negotiation Technique: Step to Pattern with Bilateral Rails Number of Stairs: 4  Height of Stairs: 6 in  Comments: step to pattern descending  GAIT: Gait pattern: decreased stride length Distance walked: 10 m Assistive device utilized: None Level of assistance: Complete Independence Comments:    FUNCTIONAL TESTS:  5 times sit to stand: 13.86 sec  6 minute walk test: test visit 2  10 meter walk test: 1 m/s Berg Balance Scale: 43 Dynamic Gait Index: 17    PATIENT SURVEYS:  FOTO 67 goal of 77   TODAY'S TREATMENT:       Performed re-eval since new symptoms and admission to Hospital     BP: 115/80 mmHg  LOWER EXTREMITY MMT:    MMT Right Eval Left Eval  Hip flexion 3+ 3+  Hip extension 3+ 3+  Hip abduction 4 4  Hip adduction 4 4  Hip internal rotation 4 4  Hip external rotation 4 4  Knee flexion 4 4  Knee extension 4 4  Ankle dorsiflexion 4 4  Ankle plantarflexion    Ankle inversion    Ankle eversion    (Blank rows = not tested)  Coordination: WNL each LE with heel to shin  Gait- ambulated - decreased step length and measured at 0.91 m/s  Deferred prolonged gait (6 min walk) or retesting balance and demonstrated/instructed in several basic LE strengthening that he can perform at home until he is seen by PCP or Neurologist due to uncertainty of condition.  See below for HEP Instruction    Treatment provided this session   Pt educated throughout session about proper posture and technique with exercises. Improved exercise technique, movement at target joints, use of target muscles after min to mod verbal, visual, tactile cues.     PATIENT EDUCATION: Education details: POC Person educated: Patient Education method: Explanation Education comprehension: verbalized understanding  HOME EXERCISE PROGRAM:  Access Code: [facility] URL: [email or web address] Date: 05/04/2023 Prepared by: Maureen Ralphs  Exercises - Seated Hip Flexion March with Ankle Weights  - 3 x weekly - 3 sets - 10 reps - Seated Long Arc Quad  - 3 x weekly - 3 sets - 10 reps - Seated Heel Raise  - 3 x weekly - 3 sets - 10 reps - Seated Toe Raise  - 3 x weekly - 3 sets - 10 reps - Seated Hip Abduction with Resistance  - 1 x daily - 3 x weekly - 3 sets -  10 reps    Access Code: [email or web address] URL: [email or web address] Date: 03/17/2023 Prepared by: Thresa Ross  Exercises - Standing Single Leg Stance with Counter Support  - 1 x daily - 7 x weekly - 2 sets - 30 second  hold - Tandem Stance  - 1 x daily - 7 x weekly - 2 sets - 30 second  hold - Forward Step Touch  - 1 x daily - 7 x weekly - 2 sets - 10 reps - Standing March with Counter  Support  - 1 x daily - 7 x weekly - 2 sets - 10 reps  GOALS: Goals reviewed with patient? Yes  SHORT TERM GOALS: Target date: 04/07/2023     Patient will be independent in home exercise program to improve strength/mobility for better functional independence with ADLs. Baseline: No HEP currently  Goal status: INITIAL   LONG TERM GOALS: Target date: 05/05/2023      1.  Patient will increase FOTO score to equal to or greater than  77   to demonstrate statistically significant improvement in mobility and quality of life.  Baseline: 67 Goal status: INITIAL   2.  Patient will increase Berg Balance score by > 6 points to demonstrate decreased fall risk during functional activities. Baseline: 43 Goal status: INITIAL   3.   Patient will improve DGI by 4 points to reduce fall risk and demonstrate improved transfer/gait ability. Baseline: 17 Goal status: INITIAL  4.   Patient will increase six minute walk test distance to >1000 for progression to community ambulator and improve gait ability Baseline: visit 2 test 03/17/23: 850 ft  Goal status: INITIAL    ASSESSMENT:  CLINICAL IMPRESSION:  Patient was re-evaluated today due to 3 recent ED visits within last week with dx of TIA. Patient has unstable condition right now and did not have any f/u appointments made at this time. Advised to immediately make a f/u visit with PCP in which he said he would when he leaves clinic today. Instructed him to only perform light LE strengthening exercises as instructed today.  He will benefit from  further gait training and LE strengthening. Pt will continue to benefit from skilled physical therapy intervention to address impairments, improve QOL, and attain therapy goals.    OBJECTIVE IMPAIRMENTS: Abnormal gait, decreased activity tolerance, decreased balance, and difficulty walking.   ACTIVITY LIMITATIONS: lifting and locomotion level  PARTICIPATION LIMITATIONS: community activity, occupation, and yard work  PERSONAL FACTORS: Age are also affecting patient's functional outcome.   REHAB POTENTIAL: Excellent  CLINICAL DECISION MAKING: Stable/uncomplicated  EVALUATION COMPLEXITY: Low  PLAN:  PT FREQUENCY: 1-2x/week  PT DURATION: 8 weeks  PLANNED INTERVENTIONS: Therapeutic exercises, Therapeutic activity, Neuromuscular re-education, Balance training, Gait training, Patient/Family education, Self Care, Joint mobilization, Stair training, Dry Needling, Manual therapy, and Re-evaluation  PLAN FOR NEXT SESSION: Patient to call to discuss PCP findings and determine if appropriate to continue PT at this time. Will keep next scheduled appointment on 05/13/2023 for now. Plan to try to progress LE strength and balance once medically stabilized.   05/04/2023, 2:19 PM   Lenda Kelp PT  Physical Therapist- [place]  [facility]

## 2023-05-05 ENCOUNTER — Encounter: Payer: Self-pay | Admitting: Family Medicine

## 2023-05-05 ENCOUNTER — Other Ambulatory Visit: Payer: Self-pay

## 2023-05-05 ENCOUNTER — Ambulatory Visit: Payer: Medicare Other | Admitting: Family Medicine

## 2023-05-05 VITALS — BP 126/62 | HR 65 | Temp 97.7°F | Resp 16 | Ht 72.0 in | Wt 203.2 lb

## 2023-05-05 DIAGNOSIS — I152 Hypertension secondary to endocrine disorders: Secondary | ICD-10-CM | POA: Diagnosis not present

## 2023-05-05 DIAGNOSIS — E1159 Type 2 diabetes mellitus with other circulatory complications: Secondary | ICD-10-CM

## 2023-05-05 DIAGNOSIS — Z8673 Personal history of transient ischemic attack (TIA), and cerebral infarction without residual deficits: Secondary | ICD-10-CM

## 2023-05-05 DIAGNOSIS — E1169 Type 2 diabetes mellitus with other specified complication: Secondary | ICD-10-CM

## 2023-05-05 LAB — CBC
HCT: 40.8 % (ref 39.0–52.0)
Hemoglobin: 13.3 g/dL (ref 13.0–17.0)
MCHC: 32.6 g/dL (ref 30.0–36.0)
MCV: 98.2 fl (ref 78.0–100.0)
Platelets: 195 10*3/uL (ref 150.0–400.0)
RBC: 4.15 Mil/uL — ABNORMAL LOW (ref 4.22–5.81)
RDW: 14.5 % (ref 11.5–15.5)
WBC: 6.3 10*3/uL (ref 4.0–10.5)

## 2023-05-05 LAB — BASIC METABOLIC PANEL
BUN: 23 mg/dL (ref 6–23)
CO2: 28 mEq/L (ref 19–32)
Calcium: 9.9 mg/dL (ref 8.4–10.5)
Chloride: 102 mEq/L (ref 96–112)
Creatinine, Ser: 0.95 mg/dL (ref 0.40–1.50)
GFR: 76.01 mL/min (ref >60.00)
Glucose, Bld: 211 mg/dL — ABNORMAL HIGH (ref 70–99)
Potassium: 4.1 mEq/L (ref 3.5–5.1)
Sodium: 139 mEq/L (ref 135–145)

## 2023-05-05 MED ORDER — BLOOD PRESSURE KIT
PACK | 0 refills | Status: AC
Start: 2023-05-05 — End: ?

## 2023-05-05 NOTE — Transitions of Care (Post Inpatient/ED Visit) (Signed)
   05/05/2023  Name: Shannon Chung MRN: [id number] DOB: [birth year]  Today's TOC FU Call Status: Today's TOC FU Call Status:: Unsuccessul Call (1st Attempt) Unsuccessful Call (1st Attempt) Date: 05/04/23  Attempted to reach the patient regarding the most recent Inpatient/ED visit.  Follow Up Plan: No further outreach attempts will be made at this time. We have been unable to contact the patient. Patient has already been seen in office Signature Karena Addison, LPN [facility] Nurse Health Advisor Direct Dial [phone]

## 2023-05-05 NOTE — Patient Instructions (Signed)
It was a pleasure meeting you today. Thank you for allowing me to take part in your health care.  Our goals for today as we discussed include:  We will get some labs today.  If they are abnormal or we need to do something about them, I will call you.  If they are normal, I will send you a message on MyChart (if it is active) or a letter in the mail.  If you don't hear from [place] in 2 weeks, please call the office at the number below.   Follow up with Neurology in August as scheduled  Follow up with Cardiology in July as scheduled  Follow up with PCP as scheduled.   If you have any questions or concerns, please do not hesitate to call the office at [phone].  I look forward to our next visit and until then take care and stay safe.  Regards,   Dana Allan, MD   [facility]

## 2023-05-05 NOTE — Progress Notes (Signed)
SUBJECTIVE:   Chief Complaint  Patient presents with   Hospitalization Follow-up   HPI Patient presents to clinic for hospital follow up.    Recently admitted to [facility] for TIA  on 06/28 and 06/30. Was discharged 07/01.  He reports now almost back to baseline. He has noticed that it takes him longer to get his words out during conversations.  Denies any symptoms of slurred speech, headaches, numbness or tingling of upper/lower extremities.  He was started on Keppra during recent hospitalization and is tolerating well. He remain on  DAPT and statin therapy.   He reports having to follow up with Neurology August 15 at [facility] with Dr Marjory Lies.   He is also scheduled for Cardiology follow up in the near future.  He has started outpatient PT and doing well.    Blood pressure well controlled on current medication. Requesting BP monitoring kit.   Office visit  05/02 He was admitted to [facility] 04/22 for right-sided numbness and weakness.  Code stroke was called.  CT head negative for acute hemorrhage or infarct.  CTA head and neck showed  70% of left proximal ICA and 60% right proximal ICA stenosis, severe nondominant/small right V2 vertebral artery stenosis in mid neck.  Moderate stenosis of the left P2 PCA.  MR brain showed no acute intra or cranial process.  Old infarct seen on left ACA-MCA watershed territory.  He was given TNK and observed in ICU for 24 hours.  Repeat CT head did not show any evidence of acute intracranial abnormalities.  He was started on ASA and Plavix x 21 days.  He has not seen neurology yet for follow-up.  Denies any visual changes, headaches, chest pain, shortness of breath, numbness, tingling or weakness.  Reports feeling is back to baseline and ready to go back to work.  Blood pressure well-controlled and compliant with current medication.  PERTINENT PMH / PSH: Hypertension CVA (03/2022) CAD DM type II Dyslipidemia Hypertension Frequent PVCs GERD Previous history of  MI Family history of paternal MI early for 27s.  OBJECTIVE:  BP 126/62   Pulse 65   Temp 97.7 F (36.5 C)   Resp 16   Ht 6' (1.829 m)   Wt 203 lb 4 oz (92.2 kg)   SpO2 99%   BMI 27.57 kg/m    Physical Exam Vitals reviewed.  Constitutional:      General: He is not in acute distress.    Appearance: He is normal weight. He is not ill-appearing.  HENT:     Head: Normocephalic.  Eyes:     General: No visual field deficit.    Conjunctiva/sclera: Conjunctivae normal.  Cardiovascular:     Rate and Rhythm: Normal rate and regular rhythm.     Pulses: Normal pulses.     Heart sounds: Normal heart sounds.  Pulmonary:     Effort: Pulmonary effort is normal.     Breath sounds: Normal breath sounds.  Neurological:     General: No focal deficit present.     Mental Status: He is alert and oriented to person, place, and time. Mental status is at baseline.     Cranial Nerves: Cranial nerves 2-12 are intact. No cranial nerve deficit, dysarthria or facial asymmetry.     Sensory: Sensation is intact. No sensory deficit.     Motor: Motor function is intact. No weakness, tremor, atrophy, abnormal muscle tone, seizure activity or pronator drift.     Coordination: Coordination is intact.  Gait: Gait is intact. Gait normal.  Psychiatric:        Mood and Affect: Mood normal.        Behavior: Behavior normal.        Thought Content: Thought content normal.        Judgment: Judgment normal.     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 Symptoms improving.  Mild difficulty with word finding. Check CBC Continue DAPT Continue statin therapy Continue Keppra per Neurology Maintain blood pressure control less than 130/80 Zio patch in place Referral sent for physical therapy Neurology scheduled for evaluation 08/13   Cardiology scheduled for 07/11 Follow up as needed    Orders: -      Basic metabolic panel -     CBC  Hypertension associated with diabetes [facility]) Assessment & Plan: Chronic Well controlled Continue Metoprolol XL 25 mg daily Continue Lisinopril 10 mg daily   Orders: -     Blood Pressure; Check blood pressure two to three times a week  Dispense: 1 kit; Refill: 0  Type 2 diabetes mellitus with other specified complication, without long-term current use of insulin (HCC) -     Microalbumin / creatinine urine ratio; Future   PDMP reviewed  Return in about 7 weeks (around 06/23/2023) for PCP.  Dana Allan, MD

## 2023-05-07 LAB — MICROALBUMIN / CREATININE URINE RATIO
Creatinine,U: 70.1 mg/dL
Microalb Creat Ratio: 1 mg/g (ref 0.0–30.0)
Microalb, Ur: <0.7 mg/dL (ref 0.0–1.9)

## 2023-05-12 ENCOUNTER — Ambulatory Visit: Payer: Medicare Other | Admitting: Medical

## 2023-05-12 NOTE — Progress Notes (Unsigned)
Cardiology Office Note Date:  05/13/2023  Patient ID:  Shannon Chung, DOB 02-Sep-1941, MRN [id number] PCP:  Dana Allan, MD  Cardiologist:  Julien Nordmann, MD Electrophysiologist: [name], MD    Chief Complaint: Cryptogenic stroke  History of Present Illness: Shannon Chung is a 82 y.o. male with PMH notable for CAD s/p CABG, L vent apical thrombus, ICM, PVCs, HTN, cryptogenic stroke, T2DM; seen today for [name], MD for EP consult d/t recent CVA. On 02/22/2023 he received TNK for right arm and leg drift as well as aphasia with stroke workup notable for 70% left carotid stenosis and negative MRI.  Subsequently he had left carotid endarterectomy and postoperatively on 6/24 had expressive aphasia with word salad noted at the end of long sentences in particular in the setting of some relative hypotension.  He had 2 further episodes on Monday 6/24, and then 1 on Tuesday 6/25 in the evening, and then a recurrent episode on 6/30 again in the evening.  Episodes have lasted anywhere from 5 to 60 minutes. He presented to the [facility] ER for each of these episodes.   He has had no further episodes of word finding difficulty since 6/30.  Continues to follow-up with PT and outpatient neurology.  There was some question of whether his recurrent TIAs were caused by his carotid endarterectomy, but neurology has continued to recommend ILR implant for ongoing arrhythmia monitoring.  In follow-up today, he is joined by his partner of 30+ years.  He has no cardiac complaints He denies chest pain, palpitations, dyspnea, PND, orthopnea, nausea, vomiting, dizziness, syncope, edema, weight gain, or early satiety.     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 06/2015 Cardiac CT: Ca score 1103 (84th %'ile);  b. 07/2015 Cath: LM 70, LAD 80p, 100/37m, D1 70, D2 95, RI 75, RCA 100p/m;  c. 07/2015 CABG x 5 (LIMA->LAD, VG->Diag, VG->OM1->OM2, VG->OM3).   COVID-19    12/2021   COVID-19 01/18/2022   COVID-19 vaccine administered 01/18/2022   Unknown how many vaccine doses have been received. Entered from Emergency Triage Note.   Diabetes mellitus without complication (HCC) 07/2015   Dyslipidemia    Essential hypertension    Essential hypertension 01/02/2015   Formatting of this note might be different from the original.  Last Assessment & Plan:   Chronic, stable. Continue current regimen.   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 dilated, EF 35% improved to 45-50% (2015);  b. 07/2015 EF 25-35% by LV gram.   Ischemic cardiomyopathy 01/02/2015   Kidney stones 04/17/2021   Lone atrial fibrillation (HCC) 1983   a. isolated episode, not on OAC.   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 06/2014: LV; resolved with coumadin-->no residual on f/u echo, no longer on coumadin.   Mural thrombus of heart 08/26/2021   Formatting of this note might be different from the original. Jun 16, 2019 Entered By: [name] Comment: left ventricle, cardiac apex   Osteoarthritis    a. 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 Jun 16, 2019 Entered By: [name] Comment: leftAug 19, 2020 Entered By: [name] Comment: resolved by total left knee replacement Jun 16, 2019 Entered By: [name] Comment: leftAug 19, 2020 Entered By: [name] Comment: resolved by total left knee replacement   Trigger finger of left hand 07/07/2019   Vitamin D deficiency     Past Surgical History:  Procedure Laterality Date   BICEPS TENDON REPAIR Right 1993   CARDIAC CATHETERIZATION N/A 07/05/2015   Procedure: Left Heart Cath and Coronary Angiography;  Surgeon: Antonieta Iba, MD;  Location: [facility] INVASIVE CV LAB;  Service: Cardiovascular;  Laterality: N/A;   CAROTID PTA/STENT INTERVENTION Left 04/26/2023   Procedure: CAROTID  PTA/STENT INTERVENTION;  Surgeon: Annice Needy, MD;  Location: [facility] INVASIVE CV LAB;  Service: Cardiovascular;  Laterality: Left;   CATARACT EXTRACTION Left 12/2016   with keratoplasty   COLONOSCOPY  2007   COLONOSCOPY WITH PROPOFOL N/A 12/02/2017   TA, SSA, rpt 3 yrs(Tahiliani, Varnita B, MD)   COLONOSCOPY WITH PROPOFOL N/A 11/26/2020   Procedure: COLONOSCOPY WITH PROPOFOL;  Surgeon: Midge Minium, MD;  Location: [facility] ENDOSCOPY;  Service: Endoscopy;  Laterality: N/A;   CORONARY ARTERY BYPASS GRAFT N/A 07/29/2015   Procedure: CORONARY ARTERY BYPASS GRAFTING (CABG) x 5 (LIMA to LAD, SVG to DIAGONAL,  SVG SEQUENTIALLY to OM1 and OM2, SVG to OM3) with Endoscopic Vein Havesting of  GREATER SAPHENOUS VEIN from RIGHT THIGH and partial LOWER LEG ;  Surgeon: Alleen Borne, MD;  Location: [facility] OR;  Service: Open Heart Surgery;  Laterality: N/A;   EYE SURGERY     b/l cataract and cornea replaced  HAND SURGERY     left hand 1st/2nd trigger fingers Dr. Hyacinth Meeker ortho    JOINT REPLACEMENT     KNEE ARTHROSCOPY Left remote   MOHS SURGERY     left cheek scc 2022 Dr. Jeannine Boga   MOHS SURGERY     x 5 facial scc   right biceps tendon     repair/re attachment    SKIN CANCER EXCISION  10/2015   BCC - L ala (pending MOHs) and L scapula (complete excision)   TEE WITHOUT CARDIOVERSION N/A 07/29/2015   Procedure: TRANSESOPHAGEAL ECHOCARDIOGRAM (TEE);  Surgeon: Alleen Borne, MD;  Location: [facility] OR;  Service: Open Heart Surgery;  Laterality: N/A;   TONSILLECTOMY  1949   TOTAL KNEE ARTHROPLASTY Left 03/18/2016   cemented L TKR; Deeann Saint, MD    Current Outpatient Medications  Medication Instructions   acetaminophen (TYLENOL) 500 mg, Oral, Every 6 hours PRN   aspirin EC 162 mg, Oral, Daily, Swallow whole.    atorvastatin (LIPITOR) 40 mg, Oral, Daily   Blood Pressure KIT Check blood pressure two to three times a week   clopidogrel (PLAVIX) 75 mg, Oral, Daily   ezetimibe (ZETIA) 10 mg, Oral, Daily   famotidine  (PEPCID) 20 MG tablet TAKE 1 TABLET BY MOUTH 2 TIMES DAILY AS NEEDED FOR HEARTBURN/INDIGESTION. D/C ZANTAC   fluticasone (FLONASE) 50 MCG/ACT nasal spray 1 spray, Each Nare, Daily PRN   levETIRAcetam (KEPPRA) 500 mg, Oral, 2 times daily   lisinopril (ZESTRIL) 10 MG tablet TAKE 1 TABLET BY MOUTH  DAILY   metFORMIN (GLUCOPHAGE) 500 mg, Oral, 2 times daily with meals   metoprolol succinate (TOPROL-XL) 25 mg, Oral, Daily   prednisoLONE acetate (PRED FORTE) 1 % ophthalmic suspension 1 drop, Both Eyes, Daily   sildenafil (REVATIO) 20 mg, Oral, Daily PRN   vitamin B-12 (CYANOCOBALAMIN) 500 mcg, Oral, Daily   Vitamin D-3 5,000 Units, Oral, Daily    Social History:  The patient  reports that he quit smoking about 44 years ago. His smoking use included cigarettes. He started smoking about 54 years ago. He has a 5 pack-year smoking history. He has never used smokeless tobacco. He reports that he does not currently use alcohol after a past usage of about 4.0 standard drinks of alcohol per week. He reports that he does not use drugs.   Family History:  The patient's family history includes Alcohol abuse in his father; Alcoholism in his father; CAD (age of onset: 28) in his father; Cancer in his daughter; Diabetes in his father; Heart disease in his father; Hyperlipidemia in his father; Hypertension in his father.  ROS:  Please see the history of present illness. All other systems are reviewed and otherwise negative.   PHYSICAL EXAM:  VS:  BP 120/72 (BP Location: Left Arm, Patient Position: Sitting, Cuff Size: Normal)   Pulse 69   Ht 6' (1.829 m)   Wt 200 lb (90.7 kg)   SpO2 97%   BMI 27.12 kg/m  BMI: Body mass index is 27.12 kg/m.  GEN- The patient is well appearing, alert and oriented x 3 today.   Lungs- Clear to ausculation bilaterally, normal work of breathing.  Heart- Regular rate and rhythm, no murmurs, rubs or gallops Extremities- No peripheral edema, warm, dry   EKG is ordered. Personal  review of EKG from today shows:    EKG Interpretation Date/Time:  Thursday May 13 2023 10:39:56 EDT Ventricular Rate:  69 PR Interval:  136 QRS Duration:  94 QT Interval:  384 QTC Calculation: 411 R Axis:   3  Text Interpretation: Normal sinus rhythm Cannot rule out Anterior infarct , age undetermined Confirmed by Sherie Don [phone]) on 05/13/2023 10:54:17 AM  All cardiac monitoring strips in chart reviewed, no AFib noted   All previous EKGs in chart reviewed: no AFib noted  04/26/2023 at 1553 EKG machine read is atrial fibrillation but upon further review rhythm is sinus with frequent PACs and artifact  Recent Labs: 05/02/2023: ALT 15 05/05/2023: BUN 23; Creatinine, Ser 0.95; Hemoglobin 13.3; Platelets 195.0; Potassium 4.1; Sodium 139  05/03/2023: Cholesterol 115; HDL 42; LDL Cholesterol 51; Total CHOL/HDL Ratio 2.7; Triglycerides 111; VLDL 22   Estimated Creatinine Clearance: 69.2 mL/min (by C-G formula based on SCr of 0.95 mg/dL).   Wt Readings from Last 3 Encounters:  05/13/23 200 lb (90.7 kg)  05/05/23 203 lb 4 oz (92.2 kg)  05/02/23 199 lb 11.8 oz (90.6 kg)     Additional studies reviewed include: Previous EP, cardiology notes.   Long term monitor, 03/18/2023 Normal sinus rhythm Patient had a min HR of 44 bpm, max HR of 207 bpm, and avg HR of 64 bpm.   2 Ventricular Tachycardia runs occurred, the run with the fastest interval lasting 4 beats with a max rate of 207 bpm, the longest lasting 4 beats with an avg rate of 123 bpm.   13 Supraventricular Tachycardia runs occurred, the run with the fastest interval lasting 9 beats with a max rate of 169 bpm, the longest lasting 13 beats with an avg rate of 109 bpm.  Idioventricular Rhythm was present. Isolated SVEs were rare (<1.0%), SVE Couplets were rare (<1.0%), and SVE Triplets were rare (<1.0%).  Isolated VEs were rare (<1.0%, 5271), VE Couplets were rare (<1.0%, 33), and VE Triplets were rare (<1.0%, 1).  Ventricular Bigeminy  was present.   No patient triggered events recorded  TTE, 02/23/2023 1. Left ventricular ejection fraction, by estimation, is >55%. The left ventricle has normal function. Left ventricular endocardial border not optimally defined to evaluate regional wall motion. There is mild left ventricular hypertrophy. Left ventricular diastolic parameters are consistent with Grade I diastolic dysfunction (impaired relaxation).   2. Right ventricular systolic function was not well visualized. The right ventricular size is normal. Tricuspid regurgitation signal is inadequate for assessing PA pressure.   3. The mitral valve is grossly normal. No evidence of mitral valve regurgitation. No evidence of mitral stenosis.   4. The aortic valve is tricuspid. There is mild thickening of the aortic valve. Aortic valve regurgitation is not visualized. Aortic valve sclerosis is present, with no evidence of aortic valve stenosis.   ASSESSMENT AND PLAN:  #) cryptogenic stroke #) recurrent TIA #) bilat carotid artery stenosis Patient presented with cryptogenic stroke No A-fib noted on EKGs, or cardiac rhythm strips Neurology has recommended an ILR for ongoing A-fib surveillance Discussed with patient and his partner, who are agreeable to ILR implant Discussed that there may be an monthly fee for remote monitoring Patient verbalizes understanding of this and is agreeable to proceed with ILR implant       Current medicines are reviewed at length with the patient today.   The patient does not have concerns regarding his medicines.  The following changes were made today:  none  Labs/ tests ordered today include:  Orders Placed This Encounter  Procedures   EKG 12-Lead     Disposition: Follow up with Dr. Lalla Brothers  at next available for ILR implant  Signed, Sherie Don, NP  05/13/23  12:51 PM  Electrophysiology [facility]

## 2023-05-13 ENCOUNTER — Encounter: Payer: Self-pay | Admitting: Cardiology

## 2023-05-13 ENCOUNTER — Ambulatory Visit: Payer: Medicare Other | Attending: Cardiology | Admitting: Cardiology

## 2023-05-13 ENCOUNTER — Ambulatory Visit: Payer: Medicare Other

## 2023-05-13 VITALS — BP 120/72 | HR 69 | Ht 72.0 in | Wt 200.0 lb

## 2023-05-13 DIAGNOSIS — M545 Low back pain, unspecified: Secondary | ICD-10-CM

## 2023-05-13 DIAGNOSIS — M25552 Pain in left hip: Secondary | ICD-10-CM

## 2023-05-13 DIAGNOSIS — I2510 Atherosclerotic heart disease of native coronary artery without angina pectoris: Secondary | ICD-10-CM | POA: Diagnosis not present

## 2023-05-13 DIAGNOSIS — M6281 Muscle weakness (generalized): Secondary | ICD-10-CM

## 2023-05-13 DIAGNOSIS — R269 Unspecified abnormalities of gait and mobility: Secondary | ICD-10-CM

## 2023-05-13 NOTE — Patient Instructions (Signed)
Medication Instructions:  Your physician recommends that you continue on your current medications as directed. Please refer to the Current Medication list given to you today.  *If you need a refill on your cardiac medications before your next appointment, please call your pharmacy*   Lab Work: No labs ordered  If you have labs (blood work) drawn today and your tests are completely normal, you will receive your results only by: MyChart Message (if you have MyChart) OR A paper copy in the mail If you have any lab test that is abnormal or we need to change your treatment, we will call you to review the results.   Testing/Procedures: No testing ordered   Follow-Up: At [facility], you and your health needs are our priority.  As part of our continuing mission to provide you with exceptional heart care, we have created designated Provider Care Teams.  These Care Teams include your primary Cardiologist (physician) and Advanced Practice Providers (APPs -  Physician Assistants and Nurse Practitioners) who all work together to provide you with the care you need, when you need it.  We recommend signing up for the patient portal called "MyChart".  Sign up information is provided on this After Visit Summary.  MyChart is used to connect with patients for Virtual Visits (Telemedicine).  Patients are able to view lab/test results, encounter notes, upcoming appointments, etc.  Non-urgent messages can be sent to your provider as well.   To learn more about what you can do with MyChart, go to [email or web address].    Your next appointment:   05/19/23 at 8:40 AM for loop implant  Provider:   Steffanie Dunn, MD

## 2023-05-13 NOTE — Therapy (Signed)
OUTPATIENT PHYSICAL THERAPY NEURO TREATMENT/RECERT   Patient Name: Shannon Chung MRN: [id number] DOB:1941-02-09, 82 y.o., male Today's Date: 05/13/2023   PCP: Dana Allan, MD  REFERRING PROVIDER:   Dana Allan, MD    END OF SESSION:  PT End of Session - 05/13/23 0851     Visit Number 9    Number of Visits 33    Date for PT Re-Evaluation 08/05/23    Progress Note Due on Visit 10    PT Start Time 0848    PT Stop Time 0930    PT Time Calculation (min) 42 min    Equipment Utilized During Treatment Gait belt    Activity Tolerance Patient tolerated treatment well    Behavior During Therapy WFL for tasks assessed/performed                  Past Medical History:  Diagnosis Date   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 06/2015 Cardiac CT: Ca score 1103 (84th %'ile);  b. 07/2015 Cath: LM 70, LAD 80p, 100/9m, D1 70, D2 95, RI 75, RCA 100p/m;  c. 07/2015 CABG x 5 (LIMA->LAD, VG->Diag, VG->OM1->OM2, VG->OM3).   COVID-19    12/2021   COVID-19 01/18/2022   COVID-19 vaccine administered 01/18/2022   Unknown how many vaccine doses have been received. Entered from Emergency Triage Note.   Diabetes mellitus without complication (HCC) 07/2015   Dyslipidemia    Essential hypertension    Essential hypertension 01/02/2015   Formatting of this note might be different from the original.  Last Assessment & Plan:   Chronic, stable. Continue current regimen.   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 dilated, EF 35% improved to 45-50% (2015);  b. 07/2015 EF 25-35% by LV gram.   Ischemic cardiomyopathy 01/02/2015   Kidney stones 04/17/2021   Lone atrial fibrillation (HCC) 1983   a. isolated episode, not on OAC.   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 06/2014: LV; resolved with coumadin-->no residual on f/u echo, no longer on coumadin.   Mural thrombus of heart 08/26/2021  Formatting of this note might be different from the original. Jun 16, 2019 Entered By: [name] Comment: left ventricle, cardiac apex   Osteoarthritis    a. 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 Jun 16, 2019 Entered By: [name] Comment: leftAug 19, 2020 Entered By: [name] Comment: resolved by total left knee replacement Jun 16, 2019 Entered By: [name] Comment: leftAug 19, 2020 Entered By: [name] Comment: resolved by total left knee replacement   Trigger finger of left hand 07/07/2019   Vitamin D deficiency    Past Surgical History:  Procedure Laterality Date   BICEPS TENDON REPAIR Right 1993   CARDIAC CATHETERIZATION N/A 07/05/2015   Procedure: Left Heart Cath and Coronary Angiography;  Surgeon: Antonieta Iba, MD;  Location: [facility] INVASIVE CV LAB;  Service: Cardiovascular;  Laterality: N/A;   CAROTID PTA/STENT INTERVENTION Left 04/26/2023   Procedure: CAROTID PTA/STENT INTERVENTION;  Surgeon: Annice Needy, MD;  Location: [facility] INVASIVE CV LAB;  Service: Cardiovascular;  Laterality: Left;   CATARACT EXTRACTION Left 12/2016   with keratoplasty   COLONOSCOPY  2007   COLONOSCOPY WITH PROPOFOL N/A 12/02/2017   TA, SSA, rpt 3 yrs(Tahiliani, Varnita B, MD)   COLONOSCOPY WITH PROPOFOL N/A 11/26/2020   Procedure: COLONOSCOPY WITH PROPOFOL;  Surgeon: Midge Minium, MD;  Location[facility] ENDOSCOPY;  Service: Endoscopy;  Laterality: N/A;   CORONARY ARTERY BYPASS GRAFT N/A 07/29/2015   Procedure: CORONARY ARTERY BYPASS GRAFTING (CABG) x 5 (LIMA to LAD, SVG to DIAGONAL,  SVG SEQUENTIALLY to OM1 and OM2, SVG to OM3) with Endoscopic Vein Havesting of  GREATER SAPHENOUS VEIN from RIGHT THIGH and partial LOWER LEG ;  Surgeon: Alleen Borne, MD;  Location: [facility] OR;  Service: Open Heart Surgery;  Laterality: N/A;   EYE SURGERY     b/l cataract and cornea replaced    HAND SURGERY     left hand 1st/2nd trigger fingers Dr. Hyacinth Meeker ortho    JOINT REPLACEMENT     KNEE  ARTHROSCOPY Left remote   MOHS SURGERY     left cheek scc 2022 Dr. Jeannine Boga   MOHS SURGERY     x 5 facial scc   right biceps tendon     repair/re attachment    SKIN CANCER EXCISION  10/2015   BCC - L ala (pending MOHs) and L scapula (complete excision)   TEE WITHOUT CARDIOVERSION N/A 07/29/2015   Procedure: TRANSESOPHAGEAL ECHOCARDIOGRAM (TEE);  Surgeon: Alleen Borne, MD;  Location: [facility] OR;  Service: Open Heart Surgery;  Laterality: N/A;   TONSILLECTOMY  1949   TOTAL KNEE ARTHROPLASTY Left 03/18/2016   cemented L TKR; Deeann Saint, MD   Patient Active Problem List  Diagnosis Date Noted   TIA (transient ischemic attack) 05/02/2023   GERD without esophagitis 05/02/2023   Carotid stenosis, symptomatic, with infarction (HCC) 04/26/2023   History of CVA (cerebrovascular accident) without residual deficits 03/04/2023   Acute CVA (cerebrovascular accident) (HCC) 02/22/2023   Chronic radicular pain of lower back 08/10/2022   Cervical spondylosis 03/11/2022   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   Bilateral carotid artery stenosis 03/11/2022   Lumbar spondylosis 10/07/2021   DDD (degenerative disc disease), lumbar 10/07/2021   History of radiation therapy 08/26/2021   Aortic atherosclerosis (HCC) 04/17/2021   Diverticulosis 04/17/2021   Hypertension associated with diabetes (HCC) 10/07/2020   Overweight (BMI 25.0-29.9) 10/07/2020   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   Vitamin D deficiency 08/01/2020   Polyp of sigmoid colon 08/01/2020   Insomnia 06/07/2020   Gastroesophageal reflux disease 04/04/2020   Degenerative joint disease of hand 03/01/2020   BPH (benign prostatic hyperplasia) 08/05/2018   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   Macular scar of both eyes 04/20/2018   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   Scotoma involving central area of both eyes 04/20/2018   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   Fatty liver 03/25/2018   Erectile dysfunction 02/23/2017   Hx of CABG  01/24/2017   Advanced care planning/counseling discussion 10/21/2015   Coronary artery disease of native artery of native heart with stable angina pectoris (HCC)    DM2 (diabetes mellitus, type 2) (HCC) 08/12/2015   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   Dyslipidemia 01/02/2015   Essential hypertension 01/02/2015   Osteoarthritis 01/02/2015   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 States saw his primary and has cardiology appointment this week and neurology next week.     Per hospital report from 05/03/2023:  Discharge Diagnoses:  Principal Problem:   TIA (transient ischemic attack) Active Problems:   Essential hypertension   Dyslipidemia   GERD without esophagitis   TIA with recurrent expressive aphasia symptoms: Symptoms have been recurrent happen 4 times with resolution.  Workup with CT head MRI brain no acute finding, CTA head and neck  reviewed from 6/24 with a S/P stent in distal left common and proximal internal carotid artery presumed to patent, and 60% stenosis in the proximal right ICA, severe narrowing in the right V2 and left V2. 2D echo from recent admission EF 50% G1 DD.  PT OT ST consulted and neurology consulte. Continue patient's home aspirin ( he is on 162mg ) , Lipitor, Plavix, and Zetia.  Neurology planning for outpatient loop recorder for further evaluation.  An EEG p done  -reported as  :This study is suggestive of cortical dysfunction arising from left temporal region, non specific etiology.No seizures or epileptiform discharges were seen throughout the recording.  Per neuro:  "Findings not clearly ictal but given remote cortical infarcts, frequency of events and the fact that the EEG is not normal, I do feel risks of antiseizure meds are outweighed by benefits.  - Keppra 500 mg BID - Include seizure precautions in discharge instructions"  And informed not to drive for 6 months.   Standard seizure precautions added as below: Per [facility] statutes, patients with seizures are not allowed to drive until  they have been seizure-free for six months. Use caution when using heavy equipment or power tools. Avoid working on ladders or at heights. Take showers instead of baths. Ensure the water temperature is not too high on the home water heater. Do not go swimming alone. When caring for infants or small children, sit down when holding, feeding, or changing them to minimize risk of injury to the child in the event you have a seizure.  To reduce risk of seizures, maintain good sleep hygiene avoid alcohol and illicit drug use, take all anti-seizure medications as prescribed.   Hypertension:BP controlled on lisinopril and metoprolol Hyperlipidemia:LDL at goal 51,continue home Lipitor 40 mg       Pt accompanied by: self  PERTINENT HISTORY: From Neurology 03/10/23 visit: Patient with past medical history significant for CAD (CABG times 03/22/2015), carotid stenosis, hypertension, hyperlipidemia, T2DM, history of CVA. He presented to the ED 02/22/2023 with acute onset right-sided weakness, he received TNK. Patient has no residual symptoms, he presents to establish care with our office. Results of cardiac monitor are pending, he is well-established with cardiology. Patient was taking aspirin 81 mg at the time of presentation to the ED.  Reviewed and discussed results from  recent hospitalization including provider notes, CTA head and neck, echocardiogram, CT, MRI brain. Results below. CTA head and neck did reveal left ICA stenosis of 70%, right ICA stenosis of 60%.   PAIN:  Are you having pain? Yes- Right knee- did not rate- states worse at time with bending/steps  PRECAUTIONS: None  WEIGHT BEARING RESTRICTIONS: No  FALLS: Has patient fallen in last 6 months? No  LIVING ENVIRONMENT: Lives with: lives with their family and lives with their spouse Lives in: House/apartment Stairs: No Has following equipment at home: None  PLOF: Independent  PATIENT GOALS: To ensure he is physically at the level he was prior to the stroke in April.   OBJECTIVE:   DIAGNOSTIC FINDINGS: no acute findings from CVA  COGNITION: Overall cognitive status: Within functional limits for tasks assessed   SENSATION: Not tested  COORDINATION: WNL  POSTURE: No Significant postural limitations  LOWER EXTREMITY ROM: WNL  LOWER EXTREMITY MMT:    MMT Right Eval Left Eval  Hip flexion 4 4+  Hip extension    Hip abduction 5 5  Hip adduction 5 5  Hip internal rotation 5 5  Hip external rotation  5 5  Knee flexion 4+ 4+  Knee extension 4+ 5  Ankle dorsiflexion 5 5  Ankle plantarflexion    Ankle inversion    Ankle eversion    (Blank rows = not tested)  BED MOBILITY:  WNL  TRANSFERS: Assistive device utilized: None  Sit to stand: Complete Independence Stand to sit: Complete Independence Chair to chair: Complete Independence Floor:  not tested     CURB:   Curb Comments: Reports hesitancy with curb   STAIRS: Level of Assistance: Modified independence Stair Negotiation Technique: Step to Pattern with Bilateral Rails Number of Stairs: 4  Height of Stairs: 6 in  Comments: step to pattern descending  GAIT: Gait pattern: decreased stride length Distance walked: 10 m Assistive device utilized: None Level of assistance: Complete Independence Comments:    FUNCTIONAL TESTS:  5 times sit to stand: 13.86 sec  6 minute walk test: test visit 2  10 meter walk test: 1 m/s Berg Balance Scale: 43 Dynamic Gait Index: 17  [facility] PT Assessment - 05/13/23 0906       Berg Balance Test   Sit to Stand Able to stand without using hands and stabilize independently    Standing Unsupported Able to stand safely 2 minutes    Sitting with Back Unsupported but Feet Supported on Floor or Stool Able to sit safely and securely 2 minutes    Stand to Sit Sits safely with minimal use of hands    Transfers Able to transfer safely, minor use of hands    Standing Unsupported with Eyes Closed Able to stand 10 seconds safely    Standing Unsupported with Feet Together Able to place feet together independently and stand for 1 minute with supervision    From Standing, Reach Forward with Outstretched Arm Can reach forward >12 cm safely (5")    From Standing Position, Pick up Object from Floor Able to pick up shoe safely and easily    From Standing Position, Turn to Look Behind Over each Shoulder Turn sideways only but maintains balance    Turn 360 Degrees Able to turn 360 degrees safely but slowly    Standing Unsupported, Alternately Place Feet on Step/Stool Able to stand independently and safely and complete 8 steps in 20 seconds    Standing Unsupported, One Foot in Front Needs help to step but can hold 15 seconds    Standing on One Leg Tries to lift leg/unable to hold 3 seconds but remains standing independently    Total Score 44      Dynamic Gait Index   Level Surface Mild Impairment    Change in Gait Speed Normal    Gait with Horizontal Head Turns Mild Impairment    Gait with Vertical Head Turns Mild Impairment    Gait and Pivot Turn Mild Impairment    Step Over Obstacle Mild Impairment    Step Around Obstacles Normal    Steps Moderate Impairment    Total Score 17              PATIENT SURVEYS:  FOTO 67 goal of 77   TODAY'S TREATMENT:     05/13/23     Physical therapy treatment session today consisted of completing assessment of goals and administration of testing as demonstrated and documented in flow sheet, treatment, and goals section of this note. Addition treatments may be found below.   BP: 132/74 mmHg      Pt educated throughout session about proper posture and technique with exercises. Improved exercise  technique, movement at target joints, use of target muscles after min to mod verbal, visual, tactile cues.     PATIENT EDUCATION: Education details: POC Person educated: Patient Education method: Explanation Education comprehension: verbalized understanding  HOME EXERCISE PROGRAM:  Access Code: [facility] URL: [email or web address] Date: 05/04/2023 Prepared by: Maureen Ralphs  Exercises - Seated Hip Flexion March with Ankle Weights  - 3 x weekly - 3 sets - 10 reps - Seated Long Arc Quad  - 3 x weekly - 3 sets - 10 reps - Seated Heel Raise  - 3 x weekly - 3 sets - 10 reps - Seated Toe Raise  - 3 x weekly - 3 sets - 10 reps - Seated Hip Abduction with Resistance  - 1 x daily - 3 x weekly - 3 sets - 10 reps    Access Code: [email or web address] URL: [email or web address] Date: 03/17/2023 Prepared by: Thresa Ross  Exercises - Standing Single Leg Stance with Counter Support  - 1 x daily - 7 x weekly - 2 sets - 30 second  hold - Tandem Stance  - 1 x daily - 7 x weekly - 2 sets - 30 second  hold - Forward Step Touch  - 1 x daily - 7 x weekly - 2 sets - 10 reps - Standing March with Counter Support  - 1 x daily - 7 x weekly - 2 sets - 10 reps  GOALS: Goals reviewed with patient? Yes  SHORT TERM GOALS: Target date: 04/07/2023     Patient will be independent in home exercise program to improve strength/mobility for better functional independence with ADLs. Baseline: No HEP currently: 05/13/2023= patient reports feeling good about recent HEP and able to complete all HEP to date Goal  status: MET   LONG TERM GOALS: Target date: 08/05/2023      1.  Patient will increase FOTO score to equal to or greater than  77   to demonstrate statistically significant improvement in mobility and quality of life.  Baseline: 67; 05/13/2023= 61 Goal status: Ongoing   2.  Patient will increase Berg Balance score by > 6 points to demonstrate decreased fall risk during functional activities. Baseline: 43; 05/13/2023=44 Goal status: Progressing   3.   Patient will improve DGI by 4 points to reduce fall risk and demonstrate improved transfer/gait ability. Baseline: 17; 05/13/2023= 17 Goal status: Ongoing 4.   Patient will increase six minute walk test distance to >1000 for progression to community ambulator and improve gait ability Baseline: visit 2 test 03/17/23: 850 ft; 05/13/2023= 830 feet with wide BOS Goal status: Ongoing    ASSESSMENT:  CLINICAL IMPRESSION:  Patient presented with good motivation overall for recert visit. His progress has been affected by recent medical condition including TIA like symptoms and multiple ED visits. He presented today with some continued gait deficits including wider base of support with decreased step length. He was reassessed and presented with slightly higher balance score but slightly decreased 6 min walk test and unchanged DGI. He has missed some visits due to medical issues and Patient's condition has the potential to improve in response to therapy. Maximum improvement is yet to be obtained. The anticipated improvement is attainable and reasonable in a generally predictable time.   Pt will continue to benefit from skilled physical therapy intervention to address impairments, improve QOL, and attain therapy goals.    OBJECTIVE IMPAIRMENTS: Abnormal gait, decreased activity tolerance, decreased balance, and difficulty walking.   ACTIVITY LIMITATIONS: lifting and locomotion level  PARTICIPATION  LIMITATIONS: community activity, occupation, and yard  work  PERSONAL FACTORS: Age are also affecting patient's functional outcome.   REHAB POTENTIAL: Excellent  CLINICAL DECISION MAKING: Stable/uncomplicated  EVALUATION COMPLEXITY: Low  PLAN:  PT FREQUENCY: 1-2x/week  PT DURATION: 8 weeks  PLANNED INTERVENTIONS: Therapeutic exercises, Therapeutic activity, Neuromuscular re-education, Balance training, Gait training, Patient/Family education, Self Care, Joint mobilization, Stair training, Dry Needling, Manual therapy, and Re-evaluation  PLAN FOR NEXT SESSION:  Continue to progress LE strength and dynamic balance as appropriate.  05/13/2023, 1:05 PM   Lenda Kelp PT  Physical Therapist- [place]  [facility]

## 2023-05-18 ENCOUNTER — Ambulatory Visit: Payer: Medicare Other

## 2023-05-18 DIAGNOSIS — R269 Unspecified abnormalities of gait and mobility: Secondary | ICD-10-CM | POA: Diagnosis not present

## 2023-05-18 DIAGNOSIS — M545 Low back pain, unspecified: Secondary | ICD-10-CM

## 2023-05-18 DIAGNOSIS — M25552 Pain in left hip: Secondary | ICD-10-CM

## 2023-05-18 DIAGNOSIS — M6281 Muscle weakness (generalized): Secondary | ICD-10-CM

## 2023-05-18 NOTE — Therapy (Signed)
OUTPATIENT PHYSICAL THERAPY NEURO TREATMENT Physical Therapy Progress Note   Dates of reporting period  03/10/23   to   05/18/23    Patient Name: Shannon Chung MRN: [id number] DOB:04-17-1941, 82 y.o., male Today's Date: 05/18/2023   PCP: Dana Yuriel Lopezmartinez, MD  REFERRING PROVIDER:   Dana Odella Appelhans, MD    END OF SESSION:  PT End of Session - 05/18/23 0935     Visit Number 10    Number of Visits 33    Date for PT Re-Evaluation 08/05/23    Progress Note Due on Visit 20    PT Start Time 0930    PT Stop Time 1010    PT Time Calculation (min) 40 min    Equipment Utilized During Treatment Gait belt    Activity Tolerance Patient tolerated treatment well;No increased pain    Behavior During Therapy [facility] for tasks assessed/performed                  Past Medical History:  Diagnosis Date   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 06/2015 Cardiac CT: Ca score 1103 (84th %'ile);  b. 07/2015 Cath: LM 70, LAD 80p, 100/74m, D1 70, D2 95, RI 75, RCA 100p/m;  c. 07/2015 CABG x 5 (LIMA->LAD, VG->Diag, VG->OM1->OM2, VG->OM3).   COVID-19    12/2021   COVID-19 01/18/2022   COVID-19 vaccine administered 01/18/2022   Unknown how many vaccine doses have been received. Entered from Emergency Triage Note.   Diabetes mellitus without complication (HCC) 07/2015   Dyslipidemia    Essential hypertension    Essential hypertension 01/02/2015   Formatting of this note might be different from the original.  Last Assessment & Plan:   Chronic, stable. Continue current regimen.   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 dilated, EF 35% improved to 45-50% (2015);  b. 07/2015 EF 25-35% by LV gram.   Ischemic cardiomyopathy 01/02/2015   Kidney stones 04/17/2021   Lone atrial fibrillation (HCC) 1983   a. isolated episode, not on OAC.   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 06/2014:  LV; resolved with coumadin-->no residual on f/u echo, no longer on coumadin.   Mural thrombus of heart 08/26/2021   Formatting of this note might be different from the original. Jun 16, 2019 Entered By: [name] Comment: left ventricle, cardiac apex   Osteoarthritis    a. 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 Jun 16, 2019 Entered By: [name] Comment: leftAug 19, 2020 Entered By: [name] Comment: resolved by total left knee replacement Jun 16, 2019 Entered By: [name] Comment: leftAug 19, 2020 Entered By: [name] Comment: resolved by total left knee replacement   Trigger finger of left hand 07/07/2019   Vitamin D deficiency    Past Surgical History:  Procedure Laterality Date   BICEPS TENDON REPAIR Right 1993   CARDIAC CATHETERIZATION N/A 07/05/2015   Procedure: Left Heart Cath and Coronary Angiography;  Surgeon: Antonieta Iba, MD;  Location: [facility] INVASIVE CV LAB;  Service: Cardiovascular;  Laterality: N/A;   CAROTID PTA/STENT INTERVENTION Left 04/26/2023   Procedure: CAROTID PTA/STENT INTERVENTION;  Surgeon: Annice Needy, MD;  Location: [facility] INVASIVE CV LAB;  Service: Cardiovascular;  Laterality: Left;   CATARACT EXTRACTION Left 12/2016   with keratoplasty   COLONOSCOPY  2007   COLONOSCOPY WITH PROPOFOL N/A 12/02/2017   TA, SSA, rpt 3 yrs(Tahiliani, Varnita B, MD)   COLONOSCOPY WITH PROPOFOL N/A 11/26/2020   Procedure: COLONOSCOPY WITH PROPOFOL;  Surgeon: Midge Minium, MD;  Location: [facility] ENDOSCOPY;  Service: Endoscopy;  Laterality: N/A;   CORONARY ARTERY BYPASS GRAFT N/A 07/29/2015   Procedure: CORONARY ARTERY BYPASS GRAFTING (CABG) x 5 (LIMA to LAD, SVG to DIAGONAL,  SVG SEQUENTIALLY to OM1 and OM2, SVG to OM3) with Endoscopic Vein Havesting of  GREATER SAPHENOUS VEIN from RIGHT THIGH and partial LOWER LEG ;  Surgeon: Alleen Borne, MD;  Location: [facility] OR;  Service: Open Heart Surgery;  Laterality: N/A;   EYE SURGERY     b/l cataract and cornea replaced    HAND  SURGERY     left hand 1st/2nd trigger fingers Dr. [facility]ac[facility]eker ortho    JOINT REPLACEMENT     KNEE ARTHROSCOPY Left remote   MOHS SURGERY     left cheek scc 2022 Dr. Jeannine Boga   MOHS SURGERY     x 5 facial scc   right biceps tendon     repair/re attachment    SKIN CANCER EXCISION  10/2015   BCC - L ala (pending MOHs) and L scapula (complete excision)   TEE WITHOUT CARDIOVERSION N/A 07/29/2015   Procedure: TRANSESOPHAGEAL ECHOCARDIOGRAM (TEE);  Surgeon: Alleen Borne, MD;  Location: [facility] OR;  Service: Open Heart Surgery;  Laterality: N/A;   TONSILLECTOMY  1949  TOTAL KNEE ARTHROPLASTY Left 03/18/2016   cemented L TKR; Deeann Saint, MD   Patient Active Problem List   Diagnosis Date Noted   TIA (transient ischemic attack) 05/02/2023   GERD without esophagitis 05/02/2023   Carotid stenosis, symptomatic, with infarction (HCC) 04/26/2023   History of CVA (cerebrovascular accident) without residual deficits 03/04/2023   Acute CVA (cerebrovascular accident) (HCC) 02/22/2023   Chronic radicular pain of lower back 08/10/2022   Cervical spondylosis 03/11/2022   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   Bilateral carotid artery stenosis 03/11/2022   Lumbar spondylosis 10/07/2021   DDD (degenerative disc disease), lumbar 10/07/2021   History of radiation therapy 08/26/2021   Aortic atherosclerosis (HCC) 04/17/2021   Diverticulosis 04/17/2021   Hypertension associated with diabetes (HCC) 10/07/2020   Overweight (BMI 25.0-29.9) 10/07/2020   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   Vitamin D deficiency 08/01/2020   Polyp of sigmoid colon 08/01/2020   Insomnia 06/07/2020   Gastroesophageal reflux disease 04/04/2020   Degenerative joint disease of hand 03/01/2020   BPH (benign prostatic hyperplasia) 08/05/2018   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   Macular scar of both eyes 04/20/2018   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   Scotoma involving central area of both eyes 04/20/2018   Macular pattern  dystrophy 04/20/2018   Fatty liver 03/25/2018   Erectile dysfunction 02/23/2017   Hx of CABG 01/24/2017   Advanced care planning/counseling discussion 10/21/2015   Coronary artery disease of native artery of native heart with stable angina pectoris (HCC)    DM2 (diabetes mellitus, type 2) (HCC) 08/12/2015   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   Dyslipidemia 01/02/2015   Essential hypertension 01/02/2015   Osteoarthritis 01/02/2015   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/02/2014    ONSET DATE: 02/22/23  REFERRING DIAG: Z86.73 (ICD-10-CM) - History of CVA (cerebrovascular accident)   THERAPY DIAG:  Abnormality of gait and mobility  Muscle weakness (generalized)  Pain in left hip  Bilateral low back pain without sciatica, unspecified chronicity  Rationale for Evaluation and Treatment: Rehabilitation  SUBJECTIVE:                                                                                                                                                                                             SUBJECTIVE STATEMENT: Pt doing well today. No updates since prior visit. Rt knee feels ok. Workin gon HEP with success.   Pt accompanied by: self  PERTINENT HISTORY:  Shannon Chung is a 81yoM who presents to OPPT for evaluation on 03/10/23, reports having a stroke in April 2024. Pt presented to the ED 02/22/2023 with acute onset right-sided weakness, he received TNK. CTA head/neck  with left ICA stenosis of 70%, right ICA stenosis of 60%. Brain MRI, head CT showed no acute process. Pt evaluated by acute PT 4/23, able to AMB household distances without device, pt reports near-baseline gait without frank acute deviation. Pt DC home with 2w holter monitor, followed by [facility]. At outpatient neurology FU, pt was free of any residual symptoms. Pt denies any falls, pain, or LOB in the 6 months preceding evaluation here. 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 Pt underwent carotid  stent placement on 04/26/23 c Dr. Festus Barren vascular surgery, post procedure code-stroke called due to changes in expressive language fluency per family, MRI negative for acute process, neurology reporting TIA in setting of post-procedure hypoperfusion/hypotension. Pt presented to ED 05/02/23 with repeat word-finding issues, MRI negative for acute process, but EEG abnormal and neurology placing on Keppra. Acute PT eval 7/1 reports pt at baseline level of function. Neurology asked pt to refrain from driving for 6 months at that time. PMH: CAD s/p CABG, carotid stenosis, HTN, HLD, T2DM, CVA, Left TKA, chronic lumbar spine DJD and back pain, cervical spondylosis, BPH, insomnia.   PAIN:  Are you having pain? No, right knee feels good   PRECAUTIONS: None  WEIGHT BEARING RESTRICTIONS: No  FALLS: Has patient fallen in last 6 months? No  PATIENT GOALS: To ensure he is physically at the level he was prior to the stroke in April.   OBJECTIVE:    PATIENT SURVEYS:  FOTO 67 goal of 75   TODAY'S TREATMENT:     05/18/23  -Overground AMB no device, no pain, consistent pacing, 6 minutes and 24 seconds  -10x STS from chair, hands free   -HIIT intervals 175ft (68sec average); 'hard effort' AMB c 56lb drag in pillow case, attached to gait belt  *2 minute recovery, 4 intervals total (67s, 65s, 54s, 49s) -resisted cable walking 12.5lb: 1x retro, 1x left, 1x rigth   PATIENT EDUCATION: Education details: POC Person educated: Patient Education method: Explanation Education comprehension: verbalized understanding  HOME EXERCISE PROGRAM:  Access Code: [facility] URL: [email or web address] Date: 05/04/2023 Prepared by: Maureen Ralphs  Exercises - Seated Hip Flexion March with Ankle Weights  - 3 x weekly - 3 sets - 10 reps - Seated Long Arc Quad  - 3 x weekly - 3 sets - 10 reps - Seated Heel Raise  - 3 x weekly - 3 sets - 10 reps - Seated Toe Raise  - 3 x weekly - 3 sets - 10 reps -  Seated Hip Abduction with Resistance  - 1 x daily - 3 x weekly - 3 sets - 10 reps    Access Code: [email or web address] URL: [email or web address] Date: 03/17/2023 Prepared by: Thresa Ross  Exercises - Standing Single Leg Stance with Counter Support  - 1 x daily - 7 x weekly - 2 sets - 30 second  hold - Tandem Stance  - 1 x daily - 7 x weekly - 2 sets - 30 second  hold - Forward Step Touch  - 1 x daily - 7 x weekly - 2 sets - 10 reps - Standing March with Counter Support  - 1 x daily - 7 x weekly - 2 sets - 10 reps  GOALS: Goals reviewed with patient? Yes  SHORT TERM GOALS: Target date: 04/07/2023    Patient will be independent in home exercise program to improve strength/mobility for better functional independence with ADLs. Baseline: No HEP currently: 05/13/2023= patient reports feeling good about recent HEP and  able to complete all HEP to date Goal status: MET   LONG TERM GOALS: Target date: 08/05/2023  1.  Patient will increase FOTO score to equal to or greater than  77   to demonstrate statistically significant improvement in mobility and quality of life.  Baseline: 67; 05/13/2023= 61 Goal status: Ongoing   2.  Patient will increase Berg Balance score by > 6 points to demonstrate decreased fall risk during functional activities. Baseline: 43; 05/13/2023=44 Goal status: Progressing   3.  Patient will improve DGI by 4 points to reduce fall risk and demonstrate improved transfer/gait ability. Baseline: 17; 05/13/2023= 17 Goal status: Ongoing 4.   Patient will increase six minute walk test distance to >1000 for progression to community ambulator and improve gait ability Baseline: visit 2 test 03/17/23: 850 ft; 05/13/2023= 830 feet with wide BOS Goal status: Ongoing    ASSESSMENT:  CLINICAL IMPRESSION:  Contiued focus on improving sustained AMB performance, integrated HIT for first time. Finished with pelvic perturbation in multi direction gait. No pain progression. Pt  remains very motivated. Pt will continue to benefit from skilled physical therapy intervention to address impairments, improve QOL, and attain therapy goals.    OBJECTIVE IMPAIRMENTS: Abnormal gait, decreased activity tolerance, decreased balance, and difficulty walking.   ACTIVITY LIMITATIONS: lifting and locomotion level  PARTICIPATION LIMITATIONS: community activity, occupation, and yard work  PERSONAL FACTORS: Age are also affecting patient's functional outcome.   REHAB POTENTIAL: Excellent  CLINICAL DECISION MAKING: Stable/uncomplicated  EVALUATION COMPLEXITY: Low  PLAN:  PT FREQUENCY: 1-2x/week  PT DURATION: 8 weeks  PLANNED INTERVENTIONS: Therapeutic exercises, Therapeutic activity, Neuromuscular re-education, Balance training, Gait training, Patient/Family education, Self Care, Joint mobilization, Stair training, Dry Needling, Manual therapy, and Re-evaluation  PLAN FOR NEXT SESSION:  Continue to progress LE strength and dynamic balance as appropriate.  05/18/2023, 9:50 AM   9:50 AM, 05/18/23 Rosamaria Lints, PT, DPT Physical Therapist - [facility]  Outpatient Physical Therapy- Main Campus [phone]     Rosamaria Lints PT  Physical Therapist- [facility]  [facility]

## 2023-05-19 ENCOUNTER — Ambulatory Visit: Payer: Medicare Other | Admitting: Cardiology

## 2023-05-20 ENCOUNTER — Ambulatory Visit: Payer: Medicare Other

## 2023-05-20 DIAGNOSIS — R269 Unspecified abnormalities of gait and mobility: Secondary | ICD-10-CM

## 2023-05-20 DIAGNOSIS — M545 Low back pain, unspecified: Secondary | ICD-10-CM

## 2023-05-20 DIAGNOSIS — M6281 Muscle weakness (generalized): Secondary | ICD-10-CM

## 2023-05-20 NOTE — Therapy (Signed)
OUTPATIENT PHYSICAL THERAPY NEURO TREATMENT    Patient Name: Shannon Chung MRN: [id number] DOB:26-Apr-1941, 82 y.o., male Today's Date: 05/20/2023   PCP: Dana Jovaun Levene, MD  REFERRING PROVIDER:   Dana Kalim Kissel, MD    END OF SESSION:  PT End of Session - 05/20/23 1106     Visit Number 11    Number of Visits 33    Date for PT Re-Evaluation 08/05/23    Progress Note Due on Visit 20    PT Start Time 1015    PT Stop Time 1055    PT Time Calculation (min) 40 min    Equipment Utilized During Treatment Gait belt    Activity Tolerance Patient tolerated treatment well;No increased pain    Behavior During Therapy [facility] for tasks assessed/performed                  Past Medical History:  Diagnosis Date   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 06/2015 Cardiac CT: Ca score 1103 (84th %'ile);  b. 07/2015 Cath: LM 70, LAD 80p, 100/70m, D1 70, D2 95, RI 75, RCA 100p/m;  c. 07/2015 CABG x 5 (LIMA->LAD, VG->Diag, VG->OM1->OM2, VG->OM3).   COVID-19    12/2021   COVID-19 01/18/2022   COVID-19 vaccine administered 01/18/2022   Unknown how many vaccine doses have been received. Entered from Emergency Triage Note.   Diabetes mellitus without complication (HCC) 07/2015   Dyslipidemia    Essential hypertension    Essential hypertension 01/02/2015   Formatting of this note might be different from the original.  Last Assessment & Plan:   Chronic, stable. Continue current regimen.   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 dilated, EF 35% improved to 45-50% (2015);  b. 07/2015 EF 25-35% by LV gram.   Ischemic cardiomyopathy 01/02/2015   Kidney stones 04/17/2021   Lone atrial fibrillation (HCC) 1983   a. isolated episode, not on OAC.   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 06/2014: LV; resolved with coumadin-->no residual on f/u echo, no longer on coumadin.   Mural thrombus of heart  08/26/2021   Formatting of this note might be different from the original. Jun 16, 2019 Entered By: [name] Comment: left ventricle, cardiac apex   Osteoarthritis    a. 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 Jun 16, 2019 Entered By: [name] Comment: leftAug 19, 2020 Entered By: [name] Comment: resolved by total left knee replacement Jun 16, 2019 Entered By: [name] Comment: leftAug 19, 2020 Entered By: [name] Comment: resolved by total left knee replacement   Trigger finger of left hand 07/07/2019   Vitamin D deficiency    Past Surgical History:  Procedure Laterality Date   BICEPS TENDON REPAIR Right 1993   CARDIAC CATHETERIZATION N/A 07/05/2015   Procedure: Left Heart Cath and Coronary Angiography;  Surgeon: Antonieta Iba, MD;  Location: [facility] INVASIVE CV LAB;  Service: Cardiovascular;  Laterality: N/A;   CAROTID PTA/STENT INTERVENTION Left 04/26/2023   Procedure: CAROTID PTA/STENT INTERVENTION;  Surgeon: Annice Needy, MD;  Location: [facility] INVASIVE CV LAB;  Service: Cardiovascular;  Laterality: Left;   CATARACT EXTRACTION Left 12/2016   with keratoplasty   COLONOSCOPY  2007   COLONOSCOPY WITH PROPOFOL N/A 12/02/2017   TA, SSA, rpt 3 yrs(Tahiliani, Varnita B, MD)   COLONOSCOPY WITH PROPOFOL N/A 11/26/2020   Procedure: COLONOSCOPY WITH PROPOFOL;  Surgeon: Midge Minium, MD;  Location: [facility] ENDOSCOPY;  Service: Endoscopy;  Laterality: N/A;   CORONARY ARTERY BYPASS GRAFT N/A 07/29/2015   Procedure: CORONARY ARTERY BYPASS GRAFTING (CABG) x 5 (LIMA to LAD, SVG to DIAGONAL,  SVG SEQUENTIALLY to OM1 and OM2, SVG to OM3) with Endoscopic Vein Havesting of  GREATER SAPHENOUS VEIN from RIGHT THIGH and partial LOWER LEG ;  Surgeon: Alleen Borne, MD;  Location: [facility] OR;  Service: Open Heart Surgery;  Laterality: N/A;   EYE SURGERY     b/l cataract and cornea replaced    HAND SURGERY     left hand 1st/2nd trigger fingers Dr. Hyacinth Meeker ortho    JOINT REPLACEMENT     KNEE  ARTHROSCOPY Left remote   MOHS SURGERY     left cheek scc 2022 Dr. Jeannine Boga   MOHS SURGERY     x 5 facial scc   right biceps tendon     repair/re attachment    SKIN CANCER EXCISION  10/2015   BCC - L ala (pending MOHs) and L scapula (complete excision)   TEE WITHOUT CARDIOVERSION N/A 07/29/2015   Procedure: TRANSESOPHAGEAL ECHOCARDIOGRAM (TEE);  Surgeon: Alleen Borne, MD;  Location[facility] OR;  Service: Open Heart Surgery;  Laterality: N/A;   TONSILLECTOMY  1949   TOTAL KNEE ARTHROPLASTY Left 03/18/2016   cemented L TKR; Deeann Saint, MD   Patient  Active Problem List   Diagnosis Date Noted   TIA (transient ischemic attack) 05/02/2023   GERD without esophagitis 05/02/2023   Carotid stenosis, symptomatic, with infarction (HCC) 04/26/2023   History of CVA (cerebrovascular accident) without residual deficits 03/04/2023   Acute CVA (cerebrovascular accident) (HCC) 02/22/2023   Chronic radicular pain of lower back 08/10/2022   Cervical spondylosis 03/11/2022   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   Bilateral carotid artery stenosis 03/11/2022   Lumbar spondylosis 10/07/2021   DDD (degenerative disc disease), lumbar 10/07/2021   History of radiation therapy 08/26/2021   Aortic atherosclerosis (HCC) 04/17/2021   Diverticulosis 04/17/2021   Hypertension associated with diabetes (HCC) 10/07/2020   Overweight (BMI 25.0-29.9) 10/07/2020   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   Vitamin D deficiency 08/01/2020   Polyp of sigmoid colon 08/01/2020   Insomnia 06/07/2020   Gastroesophageal reflux disease 04/04/2020   Degenerative joint disease of hand 03/01/2020   BPH (benign prostatic hyperplasia) 08/05/2018   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   Macular scar of both eyes 04/20/2018   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   Scotoma involving central area of both eyes 04/20/2018   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   Fatty liver 03/25/2018   Erectile dysfunction 02/23/2017   Hx of CABG  01/24/2017   Advanced care planning/counseling discussion 10/21/2015   Coronary artery disease of native artery of native heart with stable angina pectoris (HCC)    DM2 (diabetes mellitus, type 2) (HCC) 08/12/2015   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   Dyslipidemia 01/02/2015   Essential hypertension 01/02/2015   Osteoarthritis 01/02/2015   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/02/2014    ONSET DATE: 02/22/23  REFERRING DIAG: Z86.73 (ICD-10-CM) - History of CVA (cerebrovascular accident)   THERAPY DIAG:  Abnormality of gait and mobility  Muscle weakness (generalized)  Bilateral low back pain without sciatica, unspecified chronicity  Rationale for Evaluation and Treatment: Rehabilitation  SUBJECTIVE:                                                                                                                                                                                             SUBJECTIVE STATEMENT: Pt doing well today. Tired after last session, but energized at the same time. No pain.   Pt accompanied by: self  PERTINENT HISTORY:  Jehad Bisono is a 81yoM who presents to OPPT for evaluation on 03/10/23, reports having a stroke in April 2024. Pt presented to the ED 02/22/2023 with acute onset right-sided weakness, he received TNK. CTA head/neck with left ICA stenosis of 70%, right ICA stenosis of 60%. Brain MRI, head CT showed no acute process. Pt evaluated by acute  PT 4/23, able to AMB household distances without device, pt reports near-baseline gait without frank acute deviation. Pt DC home with 2w holter monitor, [facility] by [facility]. At outpatient neurology FU, pt was free of any residual symptoms. Pt denies any falls, pain, or LOB in the 6 months preceding evaluation here. 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 Pt underwent carotid stent placement on 04/26/23 c Dr. Festus Barren vascular surgery, post procedure code-stroke called due to changes in expressive  language fluency per family, MRI negative for acute process, neurology reporting TIA in setting of post-procedure hypoperfusion/hypotension. Pt presented to ED 05/02/23 with repeat word-finding issues, MRI negative for acute process, but EEG abnormal and neurology placing on Keppra. Acute PT eval 7/1 reports pt at baseline level of function. Neurology asked pt to refrain from driving for 6 months at that time. PMH: CAD s/p CABG, carotid stenosis, HTN, HLD, T2DM, CVA, Left TKA, chronic lumbar spine DJD and back pain, cervical spondylosis, BPH, insomnia.   PAIN:  Are you having pain? No, right knee feels good   PRECAUTIONS: None  WEIGHT BEARING RESTRICTIONS: No  FALLS: Has patient fallen in last 6 months? No  PATIENT GOALS: To ensure he is physically at the level he was prior to the stroke in April.   OBJECTIVE:    TODAY'S TREATMENT:     05/20/23   Overground AMB 927ft 6:46 ( 0.76m/s)  HIT, 56lb drag 6x (~1:00) 90 sec recovery ) 2 steps up/down hands free when able: 4x fwd, 4x Rt, 4x lt   PATIENT EDUCATION: Education details: POC Person educated: Patient Education method: Explanation Education comprehension: verbalized understanding  HOME EXERCISE PROGRAM:  Access Code: [facility] URL: [email or web address] Date: 05/04/2023 Prepared by: Maureen Ralphs  Exercises - Seated Hip Flexion March with Ankle Weights  - 3 x weekly - 3 sets - 10 reps - Seated Long Arc Quad  - 3 x weekly - 3 sets - 10 reps - Seated Heel Raise  - 3 x weekly - 3 sets - 10 reps - Seated Toe Raise  - 3 x weekly - 3 sets - 10 reps - Seated Hip Abduction with Resistance  - 1 x daily - 3 x weekly - 3 sets - 10 reps    Access Code: [email or web address] URL: [email or web address] Date: 03/17/2023 Prepared by: Thresa Ross  Exercises - Standing Single Leg Stance with Counter Support  - 1 x daily - 7 x weekly - 2 sets - 30 second  hold - Tandem Stance  - 1 x daily - 7 x weekly - 2 sets  - 30 second  hold - Forward Step Touch  - 1 x daily - 7 x weekly - 2 sets - 10 reps - Standing March with Counter Support  - 1 x daily - 7 x weekly - 2 sets - 10 reps  GOALS: Goals reviewed with patient? Yes  SHORT TERM GOALS: Target date: 04/07/2023    Patient will be independent in home exercise program to improve strength/mobility for better functional independence with ADLs. Baseline: No HEP currently: 05/13/2023= patient reports feeling good about recent HEP and able to complete all HEP to date Goal status: MET   LONG TERM GOALS: Target date: 08/05/2023  1.  Patient will increase FOTO score to equal to or greater than  77   to demonstrate statistically significant improvement in mobility and quality of life.  Baseline: 67; 05/13/2023= 61 Goal status: Ongoing   2.  Patient will increase Programmer, systems  score by > 6 points to demonstrate decreased fall risk during functional activities. Baseline: 43; 05/13/2023=44 Goal status: Progressing   3.  Patient will improve DGI by 4 points to reduce fall risk and demonstrate improved transfer/gait ability. Baseline: 17; 05/13/2023= 17 Goal status: Ongoing 4.   Patient will increase six minute walk test distance to >1000 for progression to community ambulator and improve gait ability Baseline: visit 2 test 03/17/23: 850 ft; 05/13/2023= 830 feet with wide BOS Goal status: Ongoing    ASSESSMENT:  CLINICAL IMPRESSION:  Contiued focus on improving sustained AMB performance, integrated HIT again. Suspect may not improve much more due to chronic knee DJD limitations, but discussed need to incorporate more power in ankle at toe-off, terminal stance, unclear if strength is currently available. Finished with balance training on stairs. No pain progression. Pt remains very motivated. Pt will continue to benefit from skilled physical therapy intervention to address impairments, improve QOL, and attain therapy goals.    OBJECTIVE IMPAIRMENTS: Abnormal  gait, decreased activity tolerance, decreased balance, and difficulty walking.   ACTIVITY LIMITATIONS: lifting and locomotion level  PARTICIPATION LIMITATIONS: community activity, occupation, and yard work  PERSONAL FACTORS: Age are also affecting patient's functional outcome.   REHAB POTENTIAL: Excellent  CLINICAL DECISION MAKING: Stable/uncomplicated  EVALUATION COMPLEXITY: Low  PLAN:  PT FREQUENCY: 1-2x/week  PT DURATION: 8 weeks  PLANNED INTERVENTIONS: Therapeutic exercises, Therapeutic activity, Neuromuscular re-education, Balance training, Gait training, Patient/Family education, Self Care, Joint mobilization, Stair training, Dry Needling, Manual therapy, and Re-evaluation  PLAN FOR NEXT SESSION:  Continue to progress LE strength and dynamic balance as appropriate.     11:16 AM, 05/20/23 Rosamaria Lints, PT, DPT Physical Therapist - [facility]  Outpatient Physical Therapy- Main [facility] [phone]     Rosamaria Lints PT  Physical Therapist- [facility]  [facility]

## 2023-05-25 ENCOUNTER — Ambulatory Visit: Payer: Medicare Other | Admitting: Physical Therapy

## 2023-05-25 DIAGNOSIS — R269 Unspecified abnormalities of gait and mobility: Secondary | ICD-10-CM

## 2023-05-25 DIAGNOSIS — M6281 Muscle weakness (generalized): Secondary | ICD-10-CM

## 2023-05-25 NOTE — Therapy (Signed)
OUTPATIENT PHYSICAL THERAPY NEURO TREATMENT    Patient Name: Shannon Chung MRN: [id number] DOB:29-Sep-1941, 82 y.o., male Today's Date: 05/25/2023   PCP: Dana Allan, MD  REFERRING PROVIDER:   Dana Allan, MD    END OF SESSION:  PT End of Session - 05/25/23 0901     Visit Number 12    Number of Visits 33    Date for PT Re-Evaluation 08/05/23    Progress Note Due on Visit 20    PT Start Time 0847    PT Stop Time 0928    PT Time Calculation (min) 41 min    Equipment Utilized During Treatment Gait belt    Activity Tolerance Patient tolerated treatment well;No increased pain    Behavior During Therapy [facility] for tasks assessed/performed                   Past Medical History:  Diagnosis Date   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 06/2015 Cardiac CT: Ca score 1103 (84th %'ile);  b. 07/2015 Cath: LM 70, LAD 80p, 100/79m, D1 70, D2 95, RI 75, RCA 100p/m;  c. 07/2015 CABG x 5 (LIMA->LAD, VG->Diag, VG->OM1->OM2, VG->OM3).   COVID-19    12/2021   COVID-19 01/18/2022   COVID-19 vaccine administered 01/18/2022   Unknown how many vaccine doses have been received. Entered from Emergency Triage Note.   Diabetes mellitus without complication (HCC) 07/2015   Dyslipidemia    Essential hypertension    Essential hypertension 01/02/2015   Formatting of this note might be different from the original.  Last Assessment & Plan:   Chronic, stable. Continue current regimen.   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 dilated, EF 35% improved to 45-50% (2015);  b. 07/2015 EF 25-35% by LV gram.   Ischemic cardiomyopathy 01/02/2015   Kidney stones 04/17/2021   Lone atrial fibrillation (HCC) 1983   a. isolated episode, not on OAC.   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 06/2014: LV; resolved with coumadin-->no residual on f/u echo, no longer on coumadin.   Mural thrombus of  heart 08/26/2021   Formatting of this note might be different from the original. Jun 16, 2019 Entered By: [name] Comment: left ventricle, cardiac apex   Osteoarthritis    a. 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 Jun 16, 2019 Entered By: [name] Comment: leftAug 19, 2020 Entered By: [name] Comment: resolved by total left knee replacement Jun 16, 2019 Entered By: [name] Comment: leftAug 19, 2020 Entered By: [name] Comment: resolved by total left knee replacement   Trigger finger of left hand 07/07/2019   Vitamin D deficiency    Past Surgical History:  Procedure Laterality Date   BICEPS TENDON REPAIR Right 1993   CARDIAC CATHETERIZATION N/A 07/05/2015   Procedure: Left Heart Cath and Coronary Angiography;  Surgeon: Antonieta Iba, MD;  Location: [facility] INVASIVE CV LAB;  Service: Cardiovascular;  Laterality: N/A;   CAROTID PTA/STENT INTERVENTION Left 04/26/2023   Procedure: CAROTID PTA/STENT INTERVENTION;  Surgeon: Annice Needy, MD;  Location: [facility] INVASIVE CV LAB;  Service: Cardiovascular;  Laterality: Left;   CATARACT EXTRACTION Left 12/2016   with keratoplasty   COLONOSCOPY  2007   COLONOSCOPY WITH PROPOFOL N/A 12/02/2017   TA, SSA, rpt 3 yrs(Tahiliani, Varnita B, MD)   COLONOSCOPY WITH PROPOFOL N/A 11/26/2020   Procedure: COLONOSCOPY WITH PROPOFOL;  Surgeon: Midge Minium, MD;  Location: [facility] ENDOSCOPY;  Service: Endoscopy;  Laterality: N/A;   CORONARY ARTERY BYPASS GRAFT N/A 07/29/2015   Procedure: CORONARY ARTERY BYPASS GRAFTING (CABG) x 5 (LIMA to LAD, SVG to DIAGONAL,  SVG SEQUENTIALLY to OM1 and OM2, SVG to OM3) with Endoscopic Vein Havesting of  GREATER SAPHENOUS VEIN from RIGHT THIGH and partial LOWER LEG ;  Surgeon: Alleen Borne, MD;  Location: [facility] OR;  Service: Open Heart Surgery;  Laterality: N/A;   EYE SURGERY     b/l cataract and cornea replaced    HAND SURGERY     left hand 1st/2nd trigger fingers Dr. Hyacinth Meeker ortho    JOINT REPLACEMENT     KNEE  ARTHROSCOPY Left remote   MOHS SURGERY     left cheek scc 2022 Dr. Jeannine Boga   MOHS SURGERY     x 5 facial scc   right biceps tendon     repair/re attachment    SKIN CANCER EXCISION  10/2015   BCC - L ala (pending MOHs) and L scapula (complete excision)   TEE WITHOUT CARDIOVERSION N/A 07/29/2015   Procedure: TRANSESOPHAGEAL ECHOCARDIOGRAM (TEE);  Surgeon: Alleen Borne, MD;  Location: [facility] OR;  Service: Open Heart Surgery;  Laterality: N/A;   TONSILLECTOMY  1949   TOTAL KNEE ARTHROPLASTY Left 03/18/2016   cemented L TKR; Deeann Saint, MD  Patient Active Problem List   Diagnosis Date Noted   TIA (transient ischemic attack) 05/02/2023   GERD without esophagitis 05/02/2023   Carotid stenosis, symptomatic, with infarction (HCC) 04/26/2023   History of CVA (cerebrovascular accident) without residual deficits 03/04/2023   Acute CVA (cerebrovascular accident) (HCC) 02/22/2023   Chronic radicular pain of lower back 08/10/2022   Cervical spondylosis 03/11/2022   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   Bilateral carotid artery stenosis 03/11/2022   Lumbar spondylosis 10/07/2021   DDD (degenerative disc disease), lumbar 10/07/2021   History of radiation therapy 08/26/2021   Aortic atherosclerosis (HCC) 04/17/2021   Diverticulosis 04/17/2021   Hypertension associated with diabetes (HCC) 10/07/2020   Overweight (BMI 25.0-29.9) 10/07/2020   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   Vitamin D deficiency 08/01/2020   Polyp of sigmoid colon 08/01/2020   Insomnia 06/07/2020   Gastroesophageal reflux disease 04/04/2020   Degenerative joint disease of hand 03/01/2020   BPH (benign prostatic hyperplasia) 08/05/2018   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   Macular scar of both eyes 04/20/2018   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   Scotoma involving central area of both eyes 04/20/2018   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   Fatty liver 03/25/2018   Erectile dysfunction 02/23/2017   Hx of CABG  01/24/2017   Advanced care planning/counseling discussion 10/21/2015   Coronary artery disease of native artery of native heart with stable angina pectoris (HCC)    DM2 (diabetes mellitus, type 2) (HCC) 08/12/2015   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   Dyslipidemia 01/02/2015   Essential hypertension 01/02/2015   Osteoarthritis 01/02/2015   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/02/2014    ONSET DATE: 02/22/23  REFERRING DIAG: Z86.73 (ICD-10-CM) - History of CVA (cerebrovascular accident)   THERAPY DIAG:  Abnormality of gait and mobility  Muscle weakness (generalized)  Rationale for Evaluation and Treatment: Rehabilitation  SUBJECTIVE:                                                                                                                                                                                             SUBJECTIVE STATEMENT: Pt doing well today. Tired after last session. 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   Pt accompanied by: self  PERTINENT HISTORY:  Monica Zahler is a 81yoM who presents to OPP[facility] for evaluation on 03/10/23, reports having a stroke in April 2024. Pt presented to the ED 02/22/2023 with acute onset right-sided weakness, he received TNK. CTA head/neck with left ICA stenosis of 70%, right ICA stenosis of 60%. Brain MRI, head CT showed no acute  process. Pt evaluated by acute PT 4/23, able to AMB household distances without device, pt reports near-baseline gait without frank acute deviation. Pt DC home with 2w holter monitor, followed by [facility]. At outpatient neurology FU, pt was free of any residual symptoms. Pt denies any falls, pain, or LOB in the 6 months preceding evaluation here. 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 Pt underwent carotid stent placement on 04/26/23 c Dr. Festus Barren vascular surgery, post procedure code-stroke called due to changes in  expressive language fluency per family, MRI negative for acute process, neurology reporting TIA in setting of post-procedure hypoperfusion/hypotension. Pt presented to ED 05/02/23 with repeat word-finding issues, MRI negative for acute process, but EEG abnormal and neurology placing on Keppra. Acute PT eval 7/1 reports pt at baseline level of function. Neurology asked pt to refrain from driving for 6 months at that time. PMH: CAD s/p CABG, carotid stenosis, HTN, HLD, T2DM, CVA, Left TKA, chronic lumbar spine DJD and back pain, cervical spondylosis, BPH, insomnia.   PAIN:  Are you having pain? No, right knee feels good   PRECAUTIONS: None  WEIGHT BEARING RESTRICTIONS: No  FALLS: Has patient fallen in last 6 months? No  PATIENT GOALS: To ensure he is physically at the level he was prior to the stroke in April.   OBJECTIVE:    TODAY'S TREATMENT:     05/25/23   BP taken in treatment session 125/81                                                                                                                       Exercise/Activity Sets/ Reps/Time/ Resistance Assistance Charge type Comments- Unless otherwise stated, CGA was provided and gait belt donned in order to ensure pt safety   Nustep interval training Leve 3 x 2 min  Level 5 x 1 min  Level 3 x 1 min  Level 5 x 1 min  Level 3 x 1 min Level 5 x 1 min    Set up and continuous monitoring for cardiovascular response and to keep SPM > 50. TE Assistance with setup and and adjusting resistance at appropriate times.  O2 sat upon completion= 96% and HR= 87 bpm  Seated LAQ  Standing march then hip extension  2 x 12 with 5#  2 x 10 with 5# AW   TE - Obvious weakness observed in right LE vs. Left with single LE cable use.   Resistive right seated ankle DF  GTB- 2 sets of 10 reps Set up TE Patient reports as fatiguing   Ambulation training with metronome  2 x 300 ft   Gait training  Uable to equalize step length, required frequent cues for  increasing R LE step length, able ot complete this but with poor consistency   Forward to rear lunge X 10 ea LE UE TE     PATIENT EDUCATION: Education details: POC Person educated: Patient Education method: Explanation Education comprehension: verbalized understanding  HOME EXERCISE  PROGRAM:  Access Code: [facility] URL: [email or web address] Date: 05/04/2023 Prepared by: Maureen Ralphs  Exercises - Seated Hip Flexion March with Ankle Weights  - 3 x weekly - 3 sets - 10 reps - Seated Long Arc Quad  - 3 x weekly - 3 sets - 10 reps - Seated Heel Raise  - 3 x weekly - 3 sets - 10 reps - Seated Toe Raise  - 3 x weekly - 3 sets - 10 reps - Seated Hip Abduction with Resistance  - 1 x daily - 3 x weekly - 3 sets - 10 reps    Access Code: [email or web address] URL: [email or web address] Date: 03/17/2023 Prepared by: Thresa Ross  Exercises - Standing Single Leg Stance with Counter Support  - 1 x daily - 7 x weekly - 2 sets - 30 second  hold - Tandem Stance  - 1 x daily - 7 x weekly - 2 sets - 30 second  hold - Forward Step Touch  - 1 x daily - 7 x weekly - 2 sets - 10 reps - Standing March with Counter Support  - 1 x daily - 7 x weekly - 2 sets - 10 reps  GOALS: Goals reviewed with patient? Yes  SHORT TERM GOALS: Target date: 04/07/2023    Patient will be independent in home exercise program to improve strength/mobility for better functional independence with ADLs. Baseline: No HEP currently: 05/13/2023= patient reports feeling good about recent HEP and able to complete all HEP to date Goal status: MET   LONG TERM GOALS: Target date: 08/05/2023  1.  Patient will increase FOTO score to equal to or greater than  77   to demonstrate statistically significant improvement in mobility and quality of life.  Baseline: 67; 05/13/2023= 61 Goal status: Ongoing   2.  Patient will increase Berg Balance score by > 6 points to demonstrate decreased fall risk during  functional activities. Baseline: 43; 05/13/2023=44 Goal status: Progressing   3.  Patient will improve DGI by 4 points to reduce fall risk and demonstrate improved transfer/gait ability. Baseline: 17; 05/13/2023= 17 Goal status: Ongoing 4.   Patient will increase six minute walk test distance to >1000 for progression to community ambulator and improve gait ability Baseline: visit 2 test 03/17/23: 850 ft; 05/13/2023= 830 feet with wide BOS Goal status: Ongoing    ASSESSMENT:  CLINICAL IMPRESSION:  Pt remains very motivated. Pt continues to have difficulty with R LE step length and is inconsistent with improving R LE step length with cues. Pt felt " a little off" during treatment session.  Pt will continue to benefit from skilled physical therapy intervention to address impairments, improve QOL, and attain therapy goals.    OBJECTIVE IMPAIRMENTS: Abnormal gait, decreased activity tolerance, decreased balance, and difficulty walking.   ACTIVITY LIMITATIONS: lifting and locomotion level  PARTICIPATION LIMITATIONS: community activity, occupation, and yard work  PERSONAL FACTORS: Age are also affecting patient's functional outcome.   REHAB POTENTIAL: Excellent  CLINICAL DECISION MAKING: Stable/uncomplicated  EVALUATION COMPLEXITY: Low  PLAN:  PT FREQUENCY: 1-2x/week  PT DURATION: 8 weeks  PLANNED INTERVENTIONS: Therapeutic exercises, Therapeutic activity, Neuromuscular re-education, Balance training, Gait training, Patient/Family education, Self Care, Joint mobilization, Stair training, Dry Needling, Manual therapy, and Re-evaluation  PLAN FOR NEXT SESSION:  Continue to progress LE strength and dynamic balance as appropriate.       Norman Herrlich PT  Physical Therapist- [place]  [facility]

## 2023-05-27 ENCOUNTER — Ambulatory Visit: Payer: Medicare Other | Admitting: Physical Therapy

## 2023-05-27 DIAGNOSIS — R269 Unspecified abnormalities of gait and mobility: Secondary | ICD-10-CM

## 2023-05-27 DIAGNOSIS — M6281 Muscle weakness (generalized): Secondary | ICD-10-CM

## 2023-05-27 NOTE — Therapy (Signed)
OUTPATIENT PHYSICAL THERAPY NEURO TREATMENT    Patient Name: Shannon Chung MRN: [id number] DOB:August 12, 1941, 82 y.o., male Today's Date: 05/27/2023   PCP: Dana Allan, MD  REFERRING PROVIDER:   Dana Allan, MD    END OF SESSION:  PT End of Session - 05/27/23 0848     Visit Number 13    Number of Visits 33    Date for PT Re-Evaluation 08/05/23    Progress Note Due on Visit 20    PT Start Time 0846    PT Stop Time 0928    PT Time Calculation (min) 42 min    Equipment Utilized During Treatment Gait belt    Activity Tolerance Patient tolerated treatment well;No increased pain    Behavior During Therapy [facility] for tasks assessed/performed                    Past Medical History:  Diagnosis Date   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 06/2015 Cardiac CT: Ca score 1103 (84th %'ile);  b. 07/2015 Cath: LM 70, LAD 80p, 100/14m, D1 70, D2 95, RI 75, RCA 100p/m;  c. 07/2015 CABG x 5 (LIMA->LAD, VG->Diag, VG->OM1->OM2, VG->OM3).   COVID-19    12/2021   COVID-19 01/18/2022   COVID-19 vaccine administered 01/18/2022   Unknown how many vaccine doses have been received. Entered from Emergency Triage Note.   Diabetes mellitus without complication (HCC) 07/2015   Dyslipidemia    Essential hypertension    Essential hypertension 01/02/2015   Formatting of this note might be different from the original.  Last Assessment & Plan:   Chronic, stable. Continue current regimen.   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 dilated, EF 35% improved to 45-50% (2015);  b. 07/2015 EF 25-35% by LV gram.   Ischemic cardiomyopathy 01/02/2015   Kidney stones 04/17/2021   Lone atrial fibrillation (HCC) 1983   a. isolated episode, not on OAC.   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 06/2014: LV; resolved with coumadin-->no residual on f/u echo, no longer on coumadin.   Mural thrombus  of heart 08/26/2021   Formatting of this note might be different from the original. Jun 16, 2019 Entered By: [name] Comment: left ventricle, cardiac apex   Osteoarthritis    a. 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 Jun 16, 2019 Entered By: [name] Comment: leftAug 19, 2020 Entered By: [name] Comment: resolved by total left knee replacement Jun 16, 2019 Entered By: [name] Comment: leftAug 19, 2020 Entered By: [name] Comment: resolved by total left knee replacement   Trigger finger of left hand 07/07/2019   Vitamin D deficiency    Past Surgical History:  Procedure Laterality Date   BICEPS TENDON REPAIR Right 1993   CARDIAC CATHETERIZATION N/A 07/05/2015   Procedure: Left Heart Cath and Coronary Angiography;  Surgeon: Antonieta Iba, MD;  Location: [facility] INVASIVE CV LAB;  Service: Cardiovascular;  Laterality: N/A;   CAROTID PTA/STENT INTERVENTION Left 04/26/2023   Procedure: CAROTID PTA/STENT INTERVENTION;  Surgeon: Annice Needy, MD;  Location: [facility] INVASIVE CV LAB;  Service: Cardiovascular;  Laterality: Left;   CATARACT EXTRACTION Left 12/2016   with keratoplasty   COLONOSCOPY  2007   COLONOSCOPY WITH PROPOFOL N/A 12/02/2017   TA, SSA, rpt 3 yrs(Tahiliani, Varnita B, MD)   COLONOSCOPY WITH PROPOFOL N/A 11/26/2020   Procedure: COLONOSCOPY WITH PROPOFOL;  Surgeon: Midge Minium, MD;  Location: [facility] ENDOSCOPY;  Service: Endoscopy;  Laterality: N/A;   CORONARY ARTERY BYPASS GRAFT N/A 07/29/2015   Procedure: CORONARY ARTERY BYPASS GRAFTING (CABG) x 5 (LIMA to LAD, SVG to DIAGONAL,  SVG SEQUENTIALLY to OM1 and OM2, SVG to OM3) with Endoscopic Vein Havesting of  GREATER SAPHENOUS VEIN from RIGHT THIGH and partial LOWER LEG ;  Surgeon: Alleen Borne, MD;  Location: [facility] OR;  Service: Open Heart Surgery;  Laterality: N/A;   EYE SURGERY     b/l cataract and cornea replaced    HAND SURGERY     left hand 1st/2nd trigger fingers Dr. Hyacinth Meeker ortho    JOINT  REPLACEMENT     KNEE ARTHROSCOPY Left remote   MOHS SURGERY     left cheek scc 2022 Dr. Jeannine Boga   MOHS SURGERY     x 5 facial scc   right biceps tendon     repair/re attachment    SKIN CANCER EXCISION  10/2015   BCC - L ala (pending MOHs) and L scapula (complete excision)   TEE WITHOUT CARDIOVERSION N/A 07/29/2015   Procedure: TRANSESOPHAGEAL ECHOCARDIOGRAM (TEE);  Surgeon: Alleen Borne, MD;  Location: [facility] OR;  Service: Open Heart Surgery;  Laterality: N/A;   TONSILLECTOMY  1949   TOTAL KNEE ARTHROPLASTY Left 03/18/2016   cemented L TKR; Deeann Saint, MD  Patient Active Problem List   Diagnosis Date Noted   TIA (transient ischemic attack) 05/02/2023   GERD without esophagitis 05/02/2023   Carotid stenosis, symptomatic, with infarction (HCC) 04/26/2023   History of CVA (cerebrovascular accident) without residual deficits 03/04/2023   Acute CVA (cerebrovascular accident) (HCC) 02/22/2023   Chronic radicular pain of lower back 08/10/2022   Cervical spondylosis 03/11/2022   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   Bilateral carotid artery stenosis 03/11/2022   Lumbar spondylosis 10/07/2021   DDD (degenerative disc disease), lumbar 10/07/2021   History of radiation therapy 08/26/2021   Aortic atherosclerosis (HCC) 04/17/2021   Diverticulosis 04/17/2021   Hypertension associated with diabetes (HCC) 10/07/2020   Overweight (BMI 25.0-29.9) 10/07/2020   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   Vitamin D deficiency 08/01/2020   Polyp of sigmoid colon 08/01/2020   Insomnia 06/07/2020   Gastroesophageal reflux disease 04/04/2020   Degenerative joint disease of hand 03/01/2020   BPH (benign prostatic hyperplasia) 08/05/2018   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   Macular scar of both eyes 04/20/2018   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   Scotoma involving central area of both eyes 04/20/2018   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   Fatty liver 03/25/2018   Erectile dysfunction  02/23/2017   Hx of CABG 01/24/2017   Advanced care planning/counseling discussion 10/21/2015   Coronary artery disease of native artery of native heart with stable angina pectoris (HCC)    DM2 (diabetes mellitus, type 2) (HCC) 08/12/2015   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   Dyslipidemia 01/02/2015   Essential hypertension 01/02/2015   Osteoarthritis 01/02/2015   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/02/2014    ONSET DATE: 02/22/23  REFERRING DIAG: Z86.73 (ICD-10-CM) - History of CVA (cerebrovascular accident)   THERAPY DIAG:  Abnormality of gait and mobility  Muscle weakness (generalized)  Rationale for Evaluation and Treatment: Rehabilitation  SUBJECTIVE:                                                                                                                                                                                             SUBJECTIVE STATEMENT: Pt reports doing well today. Pt denies any recent falls/stumbles since prior session. Pt denies any updates to medications or medical appointment since prior session. Pt reports good compliance with HEP when time permits.    Pt accompanied by: self  PERTINENT HISTORY:  Hassell Patras is a 81yoM who presents to OPPT for evaluation on 03/10/23, reports having a stroke in April 2024. Pt presented to the ED 02/22/2023 with acute onset right-sided weakness, he received TNK. CTA head/neck with left ICA stenosis of 70%, right ICA stenosis of 60%. Brain  MRI, head CT showed no acute process. Pt evaluated by acute PT 4/23, able to AMB household distances without device, pt reports near-baseline gait without frank acute deviation. Pt DC home with 2w holter monitor, followed by [facility]. At outpatient neurology FU, pt was free of any residual symptoms. Pt denies any falls, pain, or LOB in the 6 months preceding evaluation here. 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 Pt underwent carotid stent placement on 04/26/23 c Dr.  Festus Barren vascular surgery, post procedure code-stroke called due to changes in expressive language fluency per family, MRI negative for acute process, neurology reporting TIA in setting of post-procedure hypoperfusion/hypotension. Pt presented to ED 05/02/23 with repeat word-finding issues, MRI negative for acute process, but EEG abnormal and neurology placing on Keppra. Acute PT eval 7/1 reports pt at baseline level of function. Neurology asked pt to refrain from driving for 6 months at that time. PMH: CAD s/p CABG, carotid stenosis, HTN, HLD, T2DM, CVA, Left TKA, chronic lumbar spine DJD and back pain, cervical spondylosis, BPH, insomnia.   PAIN:  Are you having pain? No, right knee feels good   PRECAUTIONS: None  WEIGHT BEARING RESTRICTIONS: No  FALLS: Has patient fallen in last 6 months? No  PATIENT GOALS: To ensure he is physically at the level he was prior to the stroke in April.   OBJECTIVE:    TODAY'S TREATMENT:     05/27/23                                                                                                                   Exercise/Activity Sets/ Reps/Time/ Resistance Assistance Charge type Comments- Unless otherwise stated, CGA was provided and gait belt donned in order to ensure pt safety   Nustep interval training Level 3 x 2 min  Level 5 x 1 min  Level 3 x 1 min  Level 5 x 1 min  Level 3 x 1 min Level 5 x 1 min    Set up and continuous monitoring for cardiovascular response and to keep SPM > 50. TE   Seated LAQ  seated march  2 x 10 with 5#* 2 x 10 with 5# AW   TE *alternated in LAQ to alleviate knee pain on the R with repeated movements, pt reports minimal pain with this strategy   Heel raise with UE support for steadying   2*20 reps with 3 sec hold   TE Cues to prevent UE from assisting with movement portion   Standing step tap to 12 inch step  2 x 10 reps with 5# AW donned   Gait training  Working on large step length.   Ambulation negative split time  training on 42M walk area  X 4 reps of ambulation at 10 sec across 10 M X 175 feet of ambulation maintaining above 42M pace so approx 1 m/s ambulation   Gait training   Forward to rear lunge X 10 ea LE UE TE     PATIENT EDUCATION:  Education details: POC Person educated: Patient Education method: Explanation Education comprehension: verbalized understanding  HOME EXERCISE PROGRAM:  Access Code: [email or web address] URL: [email or web address] Date: 05/04/2023 Prepared by: Maureen Ralphs  Exercises - Seated Hip Flexion March with Ankle Weights  - 3 x weekly - 3 sets - 10 reps - Seated Long Arc Quad  - 3 x weekly - 3 sets - 10 reps - Seated Heel Raise  - 3 x weekly - 3 sets - 10 reps - Seated Toe Raise  - 3 x weekly - 3 sets - 10 reps - Seated Hip Abduction with Resistance  - 1 x daily - 3 x weekly - 3 sets - 10 reps    Access Code: [email or web address] URL: [email or web address] Date: 03/17/2023 Prepared by: Thresa Ross  Exercises - Standing Single Leg Stance with Counter Support  - 1 x daily - 7 x weekly - 2 sets - 30 second  hold - Tandem Stance  - 1 x daily - 7 x weekly - 2 sets - 30 second  hold - Forward Step Touch  - 1 x daily - 7 x weekly - 2 sets - 10 reps - Standing March with Counter Support  - 1 x daily - 7 x weekly - 2 sets - 10 reps  GOALS: Goals reviewed with patient? Yes  SHORT TERM GOALS: Target date: 04/07/2023    Patient will be independent in home exercise program to improve strength/mobility for better functional independence with ADLs. Baseline: No HEP currently: 05/13/2023= patient reports feeling good about recent HEP and able to complete all HEP to date Goal status: MET   LONG TERM GOALS: Target date: 08/05/2023  1.  Patient will increase FOTO score to equal to or greater than  77   to demonstrate statistically significant improvement in mobility and quality of life.  Baseline: 67; 05/13/2023= 61 Goal status: Ongoing   2.  Patient  will increase Berg Balance score by > 6 points to demonstrate decreased fall risk during functional activities. Baseline: 43; 05/13/2023=44 Goal status: Progressing   3.  Patient will improve DGI by 4 points to reduce fall risk and demonstrate improved transfer/gait ability. Baseline: 17; 05/13/2023= 17 Goal status: Ongoing 4.   Patient will increase six minute walk test distance to >1000 for progression to community ambulator and improve gait ability Baseline: visit 2 test 03/17/23: 850 ft; 05/13/2023= 830 feet with wide BOS Goal status: Ongoing    ASSESSMENT:  CLINICAL IMPRESSION:  Pt remains very motivated.  Pt showed good progress with ambulation training with time goals and had a natural improvement in step length with this strategy.  Had some pain in R knee to be aware of in future interventions. Pt will continue to benefit from skilled physical therapy intervention to address impairments, improve QOL, and attain therapy goals.    OBJECTIVE IMPAIRMENTS: Abnormal gait, decreased activity tolerance, decreased balance, and difficulty walking.   ACTIVITY LIMITATIONS: lifting and locomotion level  PARTICIPATION LIMITATIONS: community activity, occupation, and yard work  PERSONAL FACTORS: Age are also affecting patient's functional outcome.   REHAB POTENTIAL: Excellent  CLINICAL DECISION MAKING: Stable/uncomplicated  EVALUATION COMPLEXITY: Low  PLAN:  PT FREQUENCY: 1-2x/week  PT DURATION: 8 weeks  PLANNED INTERVENTIONS: Therapeutic exercises, Therapeutic activity, Neuromuscular re-education, Balance training, Gait training, Patient/Family education, Self Care, Joint mobilization, Stair training, Dry Needling, Manual therapy, and Re-evaluation  PLAN FOR NEXT SESSION:  Continue to progress LE strength and dynamic balance as appropriate.       Norman Herrlich  PT  Physical Therapist- [place]  [facility]

## 2023-05-28 ENCOUNTER — Ambulatory Visit (INDEPENDENT_AMBULATORY_CARE_PROVIDER_SITE_OTHER): Payer: Medicare Other | Admitting: *Deleted

## 2023-05-28 VITALS — Ht 72.0 in | Wt 200.0 lb

## 2023-05-28 DIAGNOSIS — Z0000 Encounter for general adult medical examination without abnormal findings: Secondary | ICD-10-CM

## 2023-05-28 NOTE — Patient Instructions (Signed)
Vital Signs: Unable to obtain new vitals due to this being a telehealth visit. Shannon Chung , Thank you for taking time to come for your Medicare Wellness Visit. I appreciate your ongoing commitment to your health goals. Please review the following plan we discussed and let me know if I can assist you in the future.   Referrals/Orders/Follow-Ups/Clinician Recommendations: None  This is a list of the screening recommended for you and due dates:  Health Maintenance  Topic Date Due   COVID-19 Vaccine (6 - 2023-24 season) 09/26/2022   Flu Shot  06/03/2023   Hemoglobin A1C  08/24/2023   Colon Cancer Screening  11/27/2023   Complete foot exam   12/24/2023   Eye exam for diabetics  02/03/2024   Yearly kidney function blood test for diabetes  05/04/2024   Yearly kidney health urinalysis for diabetes  05/06/2024   Medicare Annual Wellness Visit  05/27/2024   DTaP/Tdap/Td vaccine (2 - Td or Tdap) 05/01/2025   Pneumonia Vaccine  Completed   Hepatitis C Screening  Completed   Zoster (Shingles) Vaccine  Completed   HPV Vaccine  Aged Out    Advanced directives: (In Chart) A copy of your advanced directives are scanned into your chart should your provider ever need it.  Next Medicare Annual Wellness Visit scheduled for next year: No Patient will call back and schedule  Preventive Care 65 Years and Older, Male  Preventive care refers to lifestyle choices and visits with your health care provider that can promote health and wellness. What does preventive care include? A yearly physical exam. This is also called an annual well check. Dental exams once or twice a year. Routine eye exams. Ask your health care provider how often you should have your eyes checked. Personal lifestyle choices, including: Daily care of your teeth and gums. Regular physical activity. Eating a healthy diet. Avoiding tobacco and drug use. Limiting alcohol use. Practicing safe sex. Taking low doses of aspirin every  day. Taking vitamin and mineral supplements as recommended by your health care provider. What happens during an annual well check? The services and screenings done by your health care provider during your annual well check will depend on your age, overall health, lifestyle risk factors, and family history of disease. Counseling  Your health care provider may ask you questions about your: Alcohol use. Tobacco use. Drug use. Emotional well-being. Home and relationship well-being. Sexual activity. Eating habits. History of falls. Memory and ability to understand (cognition). Work and work Astronomer. Screening  You may have the following tests or measurements: Height, weight, and BMI. Blood pressure. Lipid and cholesterol levels. These may be checked every 5 years, or more frequently if you are over 12 years old. Skin check. Lung cancer screening. You may have this screening every year starting at age 107 if you have a 30-pack-year history of smoking and currently smoke or have quit within the past 15 years. Fecal occult blood test (FOBT) of the stool. You may have this test every year starting at age 84. Flexible sigmoidoscopy or colonoscopy. You may have a sigmoidoscopy every 5 years or a colonoscopy every 10 years starting at age 60. Prostate cancer screening. Recommendations will vary depending on your family history and other risks. Hepatitis C blood test. Hepatitis B blood test. Sexually transmitted disease (STD) testing. Diabetes screening. This is done by checking your blood sugar (glucose) after you have not eaten for a while (fasting). You may have this done every 1-3 years. Abdominal aortic aneurysm (  AAA) screening. You may need this if you are a current or former smoker. Osteoporosis. You may be screened starting at age 2 if you are at high risk. Talk with your health care provider about your test results, treatment options, and if necessary, the need for more  tests. Vaccines  Your health care provider may recommend certain vaccines, such as: Influenza vaccine. This is recommended every year. Tetanus, diphtheria, and acellular pertussis (Tdap, Td) vaccine. You may need a Td booster every 10 years. Zoster vaccine. You may need this after age 25. Pneumococcal 13-valent conjugate (PCV13) vaccine. One dose is recommended after age 46. Pneumococcal polysaccharide (PPSV23) vaccine. One dose is recommended after age 29. Talk to your health care provider about which screenings and vaccines you need and how often you need them. This information is not intended to replace advice given to you by your health care provider. Make sure you discuss any questions you have with your health care provider. Document Released: 11/15/2015 Document Revised: 07/08/2016 Document Reviewed: 08/20/2015 Elsevier Interactive Patient Education  2017 [employer].  Fall Prevention in the Home Falls can cause injuries. They can happen to people of all ages. There are many things you can do to make your home safe and to help prevent falls. What can I do on the outside of my home? Regularly fix the edges of walkways and driveways and fix any cracks. Remove anything that might make you trip as you walk through a door, such as a raised step or threshold. Trim any bushes or trees on the path to your home. Use bright outdoor lighting. Clear any walking paths of anything that might make someone trip, such as rocks or tools. Regularly check to see if handrails are loose or broken. Make sure that both sides of any steps have handrails. Any raised decks and porches should have guardrails on the edges. Have any leaves, snow, or ice cleared regularly. Use sand or salt on walking paths during winter. Clean up any spills in your garage right away. This includes oil or grease spills. What can I do in the bathroom? Use night lights. Install grab bars by the toilet and in the tub and shower.  Do not use towel bars as grab bars. Use non-skid mats or decals in the tub or shower. If you need to sit down in the shower, use a plastic, non-slip stool. Keep the floor dry. Clean up any water that spills on the floor as soon as it happens. Remove soap buildup in the tub or shower regularly. Attach bath mats securely with double-sided non-slip rug tape. Do not have throw rugs and other things on the floor that can make you trip. What can I do in the bedroom? Use night lights. Make sure that you have a light by your bed that is easy to reach. Do not use any sheets or blankets that are too big for your bed. They should not hang down onto the floor. Have a firm chair that has side arms. You can use this for support while you get dressed. Do not have throw rugs and other things on the floor that can make you trip. What can I do in the kitchen? Clean up any spills right away. Avoid walking on wet floors. Keep items that you use a lot in easy-to-reach places. If you need to reach something above you, use a strong step stool that has a grab bar. Keep electrical cords out of the way. Do not use floor polish  or wax that makes floors slippery. If you must use wax, use non-skid floor wax. Do not have throw rugs and other things on the floor that can make you trip. What can I do with my stairs? Do not leave any items on the stairs. Make sure that there are handrails on both sides of the stairs and use them. Fix handrails that are broken or loose. Make sure that handrails are as long as the stairways. Check any carpeting to make sure that it is firmly attached to the stairs. Fix any carpet that is loose or worn. Avoid having throw rugs at the top or bottom of the stairs. If you do have throw rugs, attach them to the floor with carpet tape. Make sure that you have a light switch at the top of the stairs and the bottom of the stairs. If you do not have them, ask someone to add them for you. What else  can I do to help prevent falls? Wear shoes that: Do not have high heels. Have rubber bottoms. Are comfortable and fit you well. Are closed at the toe. Do not wear sandals. If you use a stepladder: Make sure that it is fully opened. Do not climb a closed stepladder. Make sure that both sides of the stepladder are locked into place. Ask someone to hold it for you, if possible. Clearly mark and make sure that you can see: Any grab bars or handrails. First and last steps. Where the edge of each step is. Use tools that help you move around (mobility aids) if they are needed. These include: Canes. Walkers. Scooters. Crutches. Turn on the lights when you go into a dark area. Replace any light bulbs as soon as they burn out. Set up your furniture so you have a clear path. Avoid moving your furniture around. If any of your floors are uneven, fix them. If there are any pets around you, be aware of where they are. Review your medicines with your doctor. Some medicines can make you feel dizzy. This can increase your chance of falling. Ask your doctor what other things that you can do to help prevent falls. This information is not intended to replace advice given to you by your health care provider. Make sure you discuss any questions you have with your health care provider. Document Released: 08/15/2009 Document Revised: 03/26/2016 Document Reviewed: 11/23/2014 Elsevier Interactive Patient Education  2017 [employer].

## 2023-05-28 NOTE — Progress Notes (Signed)
Subjective:   Shannon Chung is a 82 y.o. male who presents for Medicare Annual/Subsequent preventive examination.  Visit Complete: Virtual  I connected with  Sagar Lyell Surratt on 05/28/23 by a audio enabled telemedicine application and verified that I am speaking with the correct person using two identifiers.  Patient Location: Home  Provider Location: Office/Clinic  I discussed the limitations of evaluation and management by telemedicine. The patient expressed understanding and agreed to proceed.  Vital Signs: Unable to obtain new vitals due to this being a telehealth visit.   Cardiac Risk Factors include: advanced age (>19men, >47 women);diabetes mellitus;male gender;hypertension;Other (see comment), Risk factor comments: Hx of CABG, CAD     Objective:    Today's Vitals   05/28/23 1111  Weight: 200 lb (90.7 kg)  Height: 6' (1.829 m)   Body mass index is 27.12 kg/m.     05/28/2023   11:27 AM 05/02/2023    8:15 PM 04/28/2023   10:07 AM 04/27/2023    1:00 PM 02/22/2023    6:00 PM 02/22/2023    1:33 PM 09/14/2022    2:45 PM  Advanced Directives  Does Patient Have a Medical Advance Directive? Yes No Yes No Yes No Yes  Type of Estate agent of [place];Living will Healthcare Power of [employer] of [place];Living will  Healthcare Power of [place];Living will  Healthcare Power of [place];Living will  Does patient want to make changes to medical advance directive? No - Patient declined    No - Patient declined  No - Patient declined  Copy of Healthcare Power of Attorney in Chart? Yes - validated most recent copy scanned in chart (See row information) Yes - validated most recent copy scanned in chart (See row information)   Yes - validated most recent copy scanned in chart (See row information)  No - copy requested  Would patient like information on creating a medical advance directive?  Yes (ED - Information included in AVS)  No -  Patient declined       Current Medications (verified) Outpatient Encounter Medications as of 05/28/2023  Medication Sig   acetaminophen (TYLENOL) 500 MG tablet Take 500 mg by mouth every 6 (six) hours as needed for mild pain.    aspirin 81 MG EC tablet Take 162 mg by mouth daily. Swallow whole.   atorvastatin (LIPITOR) 40 MG tablet Take 1 tablet (40 mg total) by mouth daily.   Blood Pressure KIT Check blood pressure two to three times a week   Cholecalciferol (VITAMIN D-3) 125 MCG (5000 UT) TABS Take 5,000 Units by mouth daily.   clopidogrel (PLAVIX) 75 MG tablet Take 1 tablet (75 mg total) by mouth daily.   ezetimibe (ZETIA) 10 MG tablet Take 1 tablet (10 mg total) by mouth daily.   famotidine (PEPCID) 20 MG tablet TAKE 1 TABLET BY MOUTH 2 TIMES DAILY AS NEEDED FOR HEARTBURN/INDIGESTION. D/C ZANTAC   fluticasone (FLONASE) 50 MCG/ACT nasal spray Place 1 spray into both nostrils daily as needed for allergies.   levETIRAcetam (KEPPRA) 500 MG tablet Take 1 tablet (500 mg total) by mouth 2 (two) times daily.   lisinopril (ZESTRIL) 10 MG tablet TAKE 1 TABLET BY MOUTH  DAILY   metFORMIN (GLUCOPHAGE) 500 MG tablet Take 1 tablet (500 mg total) by mouth 2 (two) times daily with a meal.   metoprolol succinate (TOPROL-XL) 25 MG 24 hr tablet TAKE 1 TABLET BY MOUTH ONCE  DAILY   prednisoLONE acetate (PRED FORTE) 1 %  ophthalmic suspension Place 1 drop into both eyes daily.   sildenafil (REVATIO) 20 MG tablet Take 1 tablet (20 mg total) by mouth daily as needed.   vitamin B-12 (CYANOCOBALAMIN) 500 MCG tablet Take 500 mcg by mouth daily.   No facility-administered encounter medications on file as of 05/28/2023.    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 06/2015 Cardiac CT: Ca score 1103 (84th %'ile);  b. 07/2015 Cath: LM 70, LAD 80p, 100/58m, D1 70, D2 95, RI 75, RCA 100p/m;  c. 07/2015 CABG x 5 (LIMA->LAD, VG->Diag, VG->OM1->OM2, VG->OM3).   COVID-19    12/2021   COVID-19 01/18/2022   COVID-19 vaccine administered 01/18/2022   Unknown how many vaccine doses have been received. Entered from Emergency Triage Note.   Diabetes mellitus without complication (HCC) 07/2015   Dyslipidemia    Essential hypertension    Essential hypertension 01/02/2015   Formatting of this note might be different from the original.  Last Assessment & Plan:   Chronic, stable. Continue current regimen.   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 dilated, EF 35% improved to 45-50% (2015);  b. 07/2015 EF 25-35% by LV gram.   Ischemic cardiomyopathy 01/02/2015   Kidney stones 04/17/2021   Lone atrial fibrillation (HCC) 1983   a. isolated episode, not on OAC.   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 06/2014: LV; resolved with coumadin-->no residual on f/u echo, no longer on coumadin.   Mural thrombus of heart 08/26/2021   Formatting of this note might be different from the original. Jun 16, 2019 Entered By: [name] Comment: left ventricle, cardiac apex   Osteoarthritis    a. 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 Jun 16, 2019 Entered By: [name] Comment: leftAug 19, 2020 Entered By: [name] Comment: resolved by total left knee replacement Jun 16, 2019 Entered By: [name] Comment: leftAug 19, 2020 Entered By: [name] Comment: resolved by total left knee replacement   Trigger finger of left hand 07/07/2019   Vitamin D deficiency    Past Surgical History:  Procedure Laterality Date   BICEPS TENDON REPAIR Right 1993   CARDIAC CATHETERIZATION N/A 07/05/2015   Procedure: Left Heart Cath and Coronary Angiography;  Surgeon: Antonieta Iba, MD;  Location: [facility] INVASIVE CV LAB;  Service: Cardiovascular;  Laterality: N/A;   CAROTID PTA/STENT INTERVENTION Left 04/26/2023   Procedure: CAROTID  PTA/STENT INTERVENTION;  Surgeon: Annice Needy, MD;  Location: [facility] INVASIVE CV LAB;  Service: Cardiovascular;  Laterality: Left;   CATARACT EXTRACTION Left 12/2016   with keratoplasty   COLONOSCOPY  2007   COLONOSCOPY WITH PROPOFOL N/A 12/02/2017   TA, SSA, rpt 3 yrs(Tahiliani, Varnita B, MD)   COLONOSCOPY WITH PROPOFOL N/A 11/26/2020   Procedure: COLONOSCOPY WITH PROPOFOL;  Surgeon: Midge Minium, MD;  Location: [facility] ENDOSCOPY;  Service: Endoscopy;  Laterality: N/A;   CORONARY ARTERY BYPASS GRAFT N/A 07/29/2015   Procedure: CORONARY ARTERY BYPASS GRAFTING (CABG) x 5 (LIMA to LAD, SVG to DIAGONAL,  SVG SEQUENTIALLY to OM1 and OM2, SVG to OM3) with Endoscopic Vein Havesting of  GREATER SAPHENOUS VEIN from RIGHT THIGH and partial LOWER LEG ;  Surgeon: Alleen Borne, MD;  Location: [facility] OR;  Service: Open Heart Surgery;  Laterality: N/A;   EYE SURGERY     b/l cataract and cornea replaced    HAND SURGERY     left hand 1st/2nd trigger fingers Dr. Hyacinth Meeker ortho    JOINT REPLACEMENT     KNEE ARTHROSCOPY Left remote   MOHS SURGERY     left cheek scc 2022 Dr. Jeannine Boga   MOHS SURGERY     x 5 facial scc   right biceps tendon     repair/re attachment    SKIN CANCER EXCISION  10/2015   BCC - L ala (pending MOHs) and L scapula (complete excision)   TEE WITHOUT CARDIOVERSION N/A 07/29/2015   Procedure: TRANSESOPHAGEAL ECHOCARDIOGRAM (TEE);  Surgeon: Alleen Borne, MD;  Location: [facility] OR;  Service: Open Heart Surgery;  Laterality: N/A;   TONSILLECTOMY  1949   TOTAL KNEE ARTHROPLASTY Left 03/18/2016   cemented L TKR; Deeann Saint, MD   Family History  Problem Relation Age of Onset   CAD Father 40       MI   Hypertension Father    Hyperlipidemia Father    Alcoholism Father    Diabetes Father    Alcohol abuse Father    Heart disease Father    Cancer Daughter        dx'ed 31 retroperitoneal liposarcoma    Social History   Socioeconomic History   Marital status: Divorced    Spouse name: [place]    Number  of children: 2   Years of education: Not on file   Highest education level: Some college, no degree  Occupational History    Comment: retired  Tobacco Use   Smoking status: Former    Current packs/day: 0.00    Average packs/day: 0.5 packs/day for 10.0 years (5.0 ttl pk-yrs)    Types: Cigarettes    Start date: 11/02/1968    Quit date: 11/02/1978    Years since quitting: 44.5   Smokeless tobacco: Never   Tobacco comments:    former smoker 1967-1980 1 pk/week no FH lung cancer   Vaping Use   Vaping status: Never Used  Substance and Sexual Activity   Alcohol use: Not Currently    Alcohol/week: 4.0 standard drinks of alcohol    Types: 4 Cans of beer per week    Comment: beer/wine on weekends   Drug use: No   Sexual activity: Not Currently    Birth control/protection: Abstinence  Other Topics Concern   Not on file  Social History Narrative   Lives with wife for 63yrs Greta Doom)   Moved from [place] years ago in 2015 to this area    Divorced, one daughter deceased   Retired Electronics engineer    Occupation Retired Clinical research associate   Edu: 1 yr college   Activity: volunteers at [employer]   Diet: good water, fruits/vegetables daily      Tested at risk for OSA in preop for CABG   Social Determinants of Health   Financial Resource Strain: Low Risk  (05/28/2023)   Overall Financial Resource Strain (CARDIA)    Difficulty of Paying Living Expenses: Not hard at all  Food Insecurity: No Food Insecurity (05/28/2023)   Hunger Vital Sign    Worried About Running Out of Food in the Last Year: Never true    Ran Out of Food in the Last Year: Never true  Transportation Needs: No Transportation Needs (05/28/2023)   PRAPARE - Administrator, Civil Service (Medical): No    Lack of Transportation (Non-Medical): No  Physical Activity: Sufficiently Active (05/28/2023)   Exercise Vital Sign    Days of Exercise per Week: 4 days    Minutes of Exercise per Session: 40 min  Recent Concern: Physical Activity  - Insufficiently Active (03/01/2023)   Exercise Vital Sign    Days of Exercise per Week: 4 days    Minutes of Exercise per Session: 20 min  Stress: No Stress Concern Present (05/28/2023)   [employer] of Occupational Health - Occupational Stress Questionnaire    Feeling of Stress : Not at all  Social Connections: Socially Integrated (05/28/2023)   Social Connection and Isolation Panel [NHANES]    Frequency of Communication with Friends and Family: More than three times a week    Frequency of Social Gatherings with Friends and Family: More than three times a week    Attends Religious Services: More than 4 times per year    Active Member of [employer] or Organizations: Yes    Attends Engineer, structural: More than 4 times per year    Marital Status: Married    Tobacco Counseling Counseling given: Not Answered Tobacco comments: former smoker [phone] 1 pk/week no FH lung cancer    Clinical Intake:  Pre-visit preparation completed: Yes  Pain : No/denies pain     BMI - recorded: 27.12 Nutritional Status: BMI 25 -29 Overweight Nutritional Risks: None Diabetes: Yes CBG done?: No Did pt. bring in CBG monitor from home?: No  How often do you need to have someone help you when you read instructions, pamphlets, or other written materials from your doctor or pharmacy?: 1 - Never  Interpreter Needed?: No  Information entered by :: r.  LPN   Activities of Daily Living    05/28/2023   11:14 AM 05/24/2023    5:16 PM  In your present state of health, do you have any difficulty performing the following activities:  Hearing? 0 0  Vision? 0 0  Comment glasses   Difficulty concentrating or making decisions? 0 0  Walking or climbing stairs? 0 1  Dressing or bathing? 0 0  Doing errands, shopping? 0 0  Preparing Food and eating ? N N  Using the Toilet? N N  In the past six months, have you accidently leaked urine? N N  Do you have problems with loss of bowel control? N  N  Managing your Medications? N N  Managing your Finances? N N  Housekeeping or managing your Housekeeping? N N    Patient Care Team: Dana Allan, MD as PCP - General (Family Medicine) Antonieta Iba, MD as PCP - Cardiology (Cardiology) [name], MD as PCP - Electrophysiology (Cardiology) Antonieta Iba, MD as Consulting Physician (Cardiology)  Indicate any recent Medical Services you may have received from other than [facility] providers in the past year (date may be approximate).     Assessment:   This is a routine wellness examination for Caige.  Hearing/Vision screen Hearing Screening - Comments:: No issues Vision Screening - Comments:: glasses  Dietary issues and exercise activities discussed:     Goals Addressed             This Visit's Progress    Patient Stated       Increase duration of exercise time       Depression Screen    05/28/2023   11:21 AM 05/05/2023   11:11 AM 03/04/2023    8:08 AM 03/04/2022    1:39 PM 10/31/2021   11:25 AM 10/07/2021    2:25 PM 08/14/2020    1:50 PM  PHQ 2/9 Scores  PHQ - 2 Score 0 0 0 0 0 0 0  PHQ- 9 Score 0 0         Fall Risk    05/28/2023   11:16 AM 05/24/2023    5:16 PM 05/05/2023   11:11 AM 03/04/2023    8:08 AM 03/04/2022    1:39 PM  Fall Risk   Falls in the past year? 0 0 0 0 1  Number falls in past yr: 0 0 0 0 0  Injury with Fall? 0 0 0 0 1  Risk for fall due to : No Fall Risks  No Fall Risks No Fall Risks History of fall(s)  Follow up Falls prevention discussed;Falls evaluation completed   Falls evaluation completed Falls evaluation completed    MEDICARE RISK AT HOME:  Medicare Risk at Home - 05/28/23 1116     Any stairs in or around the home? No    If so, are there any without handrails? No    Home free of loose throw rugs in walkways, pet beds, electrical cords, etc? Yes    Adequate lighting in your home to reduce risk of falls? Yes    Life alert? No    Use of a cane, walker or w/c? No    Grab  bars in the bathroom? Yes    Shower chair or bench in shower?  Yes    Elevated toilet seat or a handicapped toilet? No              Cognitive Function:    02/18/2017    2:47 PM  MMSE - Mini Mental State Exam  Orientation to time 5  Orientation to Place 5  Registration 3  Attention/ Calculation 0  Recall 3  Language- name 2 objects 0  Language- repeat 1  Language- follow 3 step command 3  Language- read & follow direction 0  Write a sentence 0  Copy design 0  Total score 20        05/28/2023   11:27 AM 08/14/2019   12:25 PM  6CIT Screen  What Year? 0 points 0 points  What month? 0 points 0 points  What time? 0 points 0 points  Count back from 20 0 points 0 points  Months in reverse 0 points 0 points  Repeat phrase 0 points 0 points  Total Score 0 points 0 points    Immunizations Immunization History  Administered Date(s) Administered   COVID-19, mRNA, vaccine(Comirnaty)12 years and older 08/01/2022   Fluad Quad(high Dose 65+) 07/03/2019, 07/27/2020   Influenza, High Dose Seasonal PF 08/24/2017, 07/28/2018, 07/15/2021   Influenza, Seasonal, Injecte, Preservative Fre 07/20/2016   Influenza,inj,Quad PF,6+ Mos 08/12/2015   Influenza-Unspecified 06/24/2022   Moderna Covid-19 Vaccine Bivalent Booster 90yrs & up 08/05/2021   Moderna SARS-COV2 Booster Vaccination 03/07/2021   Moderna Sars-Covid-2 Vaccination 11/14/2019, 12/12/2019   PFIZER(Purple Top)SARS-COV-2 Vaccination 08/23/2020   PNEUMOCOCCAL CONJUGATE-20 06/05/2021   Pneumococcal Conjugate-13 01/08/2014   Pneumococcal Polysaccharide-23 08/02/2012, 08/01/2015   Respiratory Syncytial Virus Vaccine,Recomb Aduvanted(Arexvy) 11/16/2022   Tdap 05/02/2015   Zoster Recombinant(Shingrix) 04/15/2018, 05/05/2019, 07/27/2019   Zoster, Live 11/02/2005    TDAP status: Up to date  Flu Vaccine status: Up to date  Pneumococcal vaccine status: Up to date  Covid-19 vaccine status: Completed vaccines  Qualifies for  Shingles Vaccine? Yes   Zostavax completed Yes   Shingrix Completed?: Yes  Screening Tests Health Maintenance  Topic Date Due   COVID-19 Vaccine (6 - 2023-24 season) 09/26/2022   INFLUENZA VACCINE  06/03/2023   HEMOGLOBIN A1C  08/24/2023   Colonoscopy  11/27/2023   FOOT EXAM  12/24/2023   Medicare Annual Wellness (AWV)  12/28/2023   OPHTHALMOLOGY EXAM  02/03/2024   Diabetic kidney evaluation - eGFR measurement  05/04/2024   Diabetic kidney evaluation - Urine ACR  05/06/2024   DTaP/Tdap/Td (2 - Td or Tdap) 05/01/2025   Pneumonia Vaccine 58+ Years old  Completed   Hepatitis C Screening  Completed   Zoster Vaccines- Shingrix  Completed   HPV VACCINES  Aged Out    Health Maintenance  Health Maintenance Due  Topic Date Due   COVID-19 Vaccine (6 - 2023-24 season) 09/26/2022    Colonoscopy 1/22 No longer required because of age   Lung Cancer Screening: (Low Dose CT Chest recommended if Age 81-80 years, 20 pack-year currently smoking OR have quit w/in 15years.) does not qualify.    Additional Screening:  Hepatitis C Screening: does qualify; Completed 1/19  Vision Screening: Recommended annual ophthalmology exams for early detection of glaucoma and other disorders of the eye. Is the patient up to date with their annual eye exam?  Yes  Who is the provider or what is the name of the office in which the patient attends annual eye exams? [facility] If pt is not established with a provider, would they like to be referred to a  provider to establish care? No .   Dental Screening: Recommended annual dental exams for proper oral hygiene  Diabetic Foot Exam: Diabetic Foot Exam: Completed 2/24  Community Resource Referral / Chronic Care Management: CRR required this visit?  No   CCM required this visit?  No     Plan:     I have personally reviewed and noted the following in the patient's chart:   Medical and social history Use of alcohol, tobacco or illicit drugs   Current medications and supplements including opioid prescriptions. Patient is not currently taking opioid prescriptions. Functional ability and status Nutritional status Physical activity Advanced directives List of other physicians Hospitalizations, surgeries, and ER visits in previous 12 months Vitals Screenings to include cognitive, depression, and falls Referrals and appointments  In addition, I have reviewed and discussed with patient certain preventive protocols, quality metrics, and best practice recommendations. A written personalized care plan for preventive services as well as general preventive health recommendations were provided to patient.     Sydell Axon, LPN   [id number]/41/[id number]   After Visit Summary: (MyChart) Due to this being a telephonic visit, the after visit summary with patients personalized plan was offered to patient via MyChart   Nurse Notes: None

## 2023-06-01 ENCOUNTER — Ambulatory Visit: Payer: Medicare Other

## 2023-06-01 DIAGNOSIS — M545 Low back pain, unspecified: Secondary | ICD-10-CM

## 2023-06-01 DIAGNOSIS — M6281 Muscle weakness (generalized): Secondary | ICD-10-CM

## 2023-06-01 DIAGNOSIS — M25552 Pain in left hip: Secondary | ICD-10-CM

## 2023-06-01 DIAGNOSIS — R269 Unspecified abnormalities of gait and mobility: Secondary | ICD-10-CM | POA: Diagnosis not present

## 2023-06-01 NOTE — Therapy (Signed)
OUTPATIENT PHYSICAL THERAPY NEURO TREATMENT    Patient Name: Shannon Chung MRN: [id number] DOB:July 28, 1941, 82 y.o., male Today's Date: 06/01/2023   PCP: Dana Allan, MD  REFERRING PROVIDER:   Dana Allan, MD    END OF SESSION:  PT End of Session - 06/01/23 1054     Visit Number 14    Number of Visits 33    Date for PT Re-Evaluation 08/05/23    Progress Note Due on Visit 20    PT Start Time 1055    PT Stop Time 1140    PT Time Calculation (min) 45 min    Equipment Utilized During Treatment Gait belt    Activity Tolerance Patient tolerated treatment well;No increased pain    Behavior During Therapy [facility] for tasks assessed/performed                     Past Medical History:  Diagnosis Date   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 06/2015 Cardiac CT: Ca score 1103 (84th %'ile);  b. 07/2015 Cath: LM 70, LAD 80p, 100/79m, D1 70, D2 95, RI 75, RCA 100p/m;  c. 07/2015 CABG x 5 (LIMA->LAD, VG->Diag, VG->OM1->OM2, VG->OM3).   COVID-19    12/2021   COVID-19 01/18/2022   COVID-19 vaccine administered 01/18/2022   Unknown how many vaccine doses have been received. Entered from Emergency Triage Note.   Diabetes mellitus without complication (HCC) 07/2015   Dyslipidemia    Essential hypertension    Essential hypertension 01/02/2015   Formatting of this note might be different from the original.  Last Assessment & Plan:   Chronic, stable. Continue current regimen.   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 dilated, EF 35% improved to 45-50% (2015);  b. 07/2015 EF 25-35% by LV gram.   Ischemic cardiomyopathy 01/02/2015   Kidney stones 04/17/2021   Lone atrial fibrillation (HCC) 1983   a. isolated episode, not on OAC.   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 06/2014: LV; resolved with coumadin-->no residual on f/u echo, no longer on coumadin.   Mural  thrombus of heart 08/26/2021   Formatting of this note might be different from the original. Jun 16, 2019 Entered By: [name] Comment: left ventricle, cardiac apex   Osteoarthritis    a. 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 Jun 16, 2019 Entered By: [name] Comment: leftAug 19, 2020 Entered By: [name] Comment: resolved by total left knee replacement Jun 16, 2019 Entered By: [name] Comment: leftAug 19, 2020 Entered By: [name] Comment: resolved by total left knee replacement   Trigger finger of left hand 07/07/2019   Vitamin D deficiency    Past Surgical History:  Procedure Laterality Date   BICEPS TENDON REPAIR Right 1993   CARDIAC CATHETERIZATION N/A 07/05/2015   Procedure: Left Heart Cath and Coronary Angiography;  Surgeon: Antonieta Iba, MD;  Location: [facility] INVASIVE CV LAB;  Service: Cardiovascular;  Laterality: N/A;   CAROTID PTA/STENT INTERVENTION Left 04/26/2023   Procedure: CAROTID PTA/STENT INTERVENTION;  Surgeon: Annice Needy, MD;  Location: [facility] INVASIVE CV LAB;  Service: Cardiovascular;  Laterality: Left;   CATARACT EXTRACTION Left 12/2016   with keratoplasty   COLONOSCOPY  2007   COLONOSCOPY WITH PROPOFOL N/A 12/02/2017   TA, SSA, rpt 3 yrs(Tahiliani, Varnita B, MD)   COLONOSCOPY WITH PROPOFOL N/A 11/26/2020   Procedure: COLONOSCOPY WITH PROPOFOL;  Surgeon: Midge Minium, MD;  Location: [facility] ENDOSCOPY;  Service: Endoscopy;  Laterality: N/A;   CORONARY ARTERY BYPASS GRAFT N/A 07/29/2015   Procedure: CORONARY ARTERY BYPASS GRAFTING (CABG) x 5 (LIMA to LAD, SVG to DIAGONAL,  SVG SEQUENTIALLY to OM1 and OM2, SVG to OM3) with Endoscopic Vein Havesting of  GREATER SAPHENOUS VEIN from RIGHT THIGH and partial LOWER LEG ;  Surgeon: Alleen Borne, MD;  Location: [facility] OR;  Service: Open Heart Surgery;  Laterality: N/A;   EYE SURGERY     b/l cataract and cornea replaced    HAND SURGERY     left hand 1st/2nd trigger fingers Dr. Hyac[facility] Meeker ortho    JOINT  REPLACEMENT     KNEE ARTHROSCOPY Left remote   MOHS SURGERY     left cheek scc 2022 Dr. Jeannine Boga   MOHS SURGERY     x 5 facial scc   right biceps tendon     repair/re attachment    SKIN CANCER EXCISION  10/2015   BCC - L ala (pending MOHs) and L scapula (complete excision)   TEE WITHOUT CARDIOVERSION N/A 07/29/2015   Procedure: TRANSESOPHAGEAL ECHOCARDIOGRAM (TEE);  Surgeon: Alleen Borne, MD;  Location: [facility] OR;  Service: Open Heart Surgery;  Laterality: N/A;   TONSILLECTOMY  1949   TOTAL KNEE ARTHROPLASTY Left 03/18/2016   cemented L TKR; Deeann Saint, MD  Patient Active Problem List   Diagnosis Date Noted   TIA (transient ischemic attack) 05/02/2023   GERD without esophagitis 05/02/2023   Carotid stenosis, symptomatic, with infarction (HCC) 04/26/2023   History of CVA (cerebrovascular accident) without residual deficits 03/04/2023   Acute CVA (cerebrovascular accident) (HCC) 02/22/2023   Chronic radicular pain of lower back 08/10/2022   Cervical spondylosis 03/11/2022   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   Bilateral carotid artery stenosis 03/11/2022   Lumbar spondylosis 10/07/2021   DDD (degenerative disc disease), lumbar 10/07/2021   History of radiation therapy 08/26/2021   Aortic atherosclerosis (HCC) 04/17/2021   Diverticulosis 04/17/2021   Hypertension associated with diabetes (HCC) 10/07/2020   Overweight (BMI 25.0-29.9) 10/07/2020   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   Vitamin D deficiency 08/01/2020   Polyp of sigmoid colon 08/01/2020   Insomnia 06/07/2020   Gastroesophageal reflux disease 04/04/2020   Degenerative joint disease of hand 03/01/2020   BPH (benign prostatic hyperplasia) 08/05/2018   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   Macular scar of both eyes 04/20/2018   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   Scotoma involving central area of both eyes 04/20/2018   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   Fatty liver 03/25/2018   Erectile dysfunction  02/23/2017   Hx of CABG 01/24/2017   Advanced care planning/counseling discussion 10/21/2015   Coronary artery disease of native artery of native heart with stable angina pectoris (HCC)    DM2 (diabetes mellitus, type 2) (HCC) 08/12/2015   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   Dyslipidemia 01/02/2015   Essential hypertension 01/02/2015   Osteoarthritis 01/02/2015   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/02/2014    ONSET DATE: 02/22/23  REFERRING DIAG: Z86.73 (ICD-10-CM) - History of CVA (cerebrovascular accident)   THERAPY DIAG:  Abnormality of gait and mobility  Muscle weakness (generalized)  Bilateral low back pain without sciatica, unspecified chronicity  Pain in left hip  Rationale for Evaluation and Treatment: Rehabilitation  SUBJECTIVE:                                                                                                                                                                                             SUBJECTIVE STATEMENT: Patient reports no new news or issues since last visit.   Pt accompanied by: self  PERTINENT HISTORY:  Shannon Chung is a 81yoM who presents to OPPT for evaluation on 03/10/23, reports having a stroke in April 2024. Pt presented to the ED 02/22/2023 with acute onset right-sided weakness, he received TNK. CTA head/neck with left ICA stenosis of 70%, right ICA stenosis of 60%. Brain MRI, head CT showed no acute process. Pt evaluated by acute  PT 4/23, able to AMB household distances without device, pt reports near-baseline gait without frank acute deviation. Pt DC home with 2w holter monitor, followed by [facility]. At outpatient neurology FU, pt was free of any residual symptoms. Pt denies any falls, pain, or LOB in the 6 months preceding evaluation here. 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 Pt underwent carotid stent placement on 04/26/23 c Dr. Festus Barren vascular surgery, post procedure code-stroke called due to changes in  expressive language fluency per family, MRI negative for acute process, neurology reporting TIA in setting of post-procedure hypoperfusion/hypotension. Pt presented to ED 05/02/23 with repeat word-finding issues, MRI negative for acute process, but EEG abnormal and neurology placing on Keppra. Acute PT eval 7/1 reports pt at baseline level of function. Neurology asked pt to refrain from driving for 6 months at that time. PMH: CAD s/p CABG, carotid stenosis, HTN, HLD, T2DM, CVA, Left TKA, chronic lumbar spine DJD and back pain, cervical spondylosis, BPH, insomnia.   PAIN:  Are you having pain? No, right knee feels good   PRECAUTIONS: None  WEIGHT BEARING RESTRICTIONS: No  FALLS: Has patient fallen in last 6 months? No  PATIENT GOALS: To ensure he is physically at the level he was prior to the stroke in April.   OBJECTIVE:    TODAY'S TREATMENT:     06/01/23                                                                                                                   Exercise/Activity Sets/ Reps/Time/ Resistance Assistance Charge type Comments- Unless otherwise stated, CGA was provided and gait belt donned in order to ensure pt safety   Nustep interval training Level 1 x 1 min  Level 3 x 1 min  Level 5 x 1 min  Level 2 x 1 min  Level 4 x 1 min Level 6 x 1 min    Set up and continuous monitoring for cardiovascular response and to keep SPM > 50. TE  HR after 1 min = 76 bpm and O2 sat=97 HR after 6 min = 79 bpm and O2 sat=97 Total distance = 6 min  Standing high knee march - attempted on airex pad - unable to perform without UE support so performed on firm surface 15 reps each LE with UE support on airex and 15 reps without UE support on firm support  CGA NMR *much improved ability to perform without UE support on firm surface  Heel raise with UE support for steadying   2*20 reps with 3 sec hold   TE  VC to hold 3 sec.   Static standing on incline board with pertubations A/P/Lateral at  shoulders and later hips    X several min- in // bars CGA- with PT performing physical pertubations NMR  Good overall balance reaction with no LOB  Ambulation negative split time training on 52M walk area  X 4 reps of ambulation at s 10 M   Gait training  1) 10.52 sec  2) 9.58 sec 3) 8.91 sec  4) 8.33 sec   Forward to rear lunge squat walk in // bars X 2 laps down and back CGA TE  VC for less UE support to engage more LE power  Forward step over cones in // bars (down lifting RLE over cones x 4 then back lifting LLE)   X 3 laps down and back CGA NMR No UE support  Side stepping over 4 cones in // bars X 3 laps   CGA NMR No UE support          PATIENT EDUCATION: Education details: POC Person educated: Patient Education method: Explanation Education comprehension: verbalized understanding  HOME EXERCISE PROGRAM:  Access Code: [facility] URL: [email or web address] Date: 05/04/2023 Prepared by: Maureen Ralphs  Exercises - Seated Hip Flexion March with Ankle Weights  - 3 x weekly - 3 sets - 10 reps - Seated Long Arc Quad  - 3 x weekly - 3 sets - 10 reps - Seated Heel Raise  - 3 x weekly - 3 sets - 10 reps - Seated Toe Raise  - 3 x weekly - 3 sets - 10 reps - Seated Hip Abduction with Resistance  - 1 x daily - 3 x weekly - 3 sets - 10 reps    Access Code: [email or web address] URL: [email or web address] Date: 03/17/2023 Prepared by: Thresa Ross  Exercises - Standing Single Leg Stance with Counter Support  - 1 x daily - 7 x weekly - 2 sets - 30 second  hold - Tandem Stance  - 1 x daily - 7 x weekly - 2 sets - 30 second  hold - Forward Step Touch  - 1 x daily - 7 x weekly - 2 sets - 10 reps - Standing March with Counter Support  - 1 x daily - 7 x weekly - 2 sets - 10 reps  GOALS: Goals reviewed with patient? Yes  SHORT TERM GOALS: Target date: 04/07/2023    Patient will be independent in home exercise program to improve strength/mobility for better  functional independence with ADLs. Baseline: No HEP currently: 05/13/2023= patient reports feeling good about recent HEP and able to complete all HEP to date Goal status: MET   LONG TERM GOALS: Target date: 08/05/2023  1.  Patient will increase FOTO score to equal to or greater than  77   to demonstrate statistically significant improvement in mobility and quality of life.  Baseline: 67; 05/13/2023= 61 Goal status: Ongoing   2.  Patient will increase Berg Balance score by > 6 points to demonstrate decreased fall risk during functional activities. Baseline: 43; 05/13/2023=44 Goal status: Progressing   3.  Patient will improve DGI by 4 points to reduce fall risk and demonstrate improved transfer/gait ability. Baseline: 17; 05/13/2023= 17 Goal status: Ongoing 4.   Patient will increase six minute walk test distance to >1000 for progression to community ambulator and improve gait ability Baseline: visit 2 test 03/17/23: 850 ft; 05/13/2023= 830 feet with wide BOS Goal status: Ongoing    ASSESSMENT:  CLINICAL IMPRESSION:  Patient presents with good overall motivation for today's session. He responded with some fatigue after increased resistance with interval Nustep today. He performed well overall with balance activities- intermittent use of UE support but overall less unsteadiness- able to pick leg up for improved single leg stance and clear obstacles well overall today.  Pt will continue to benefit from skilled physical therapy intervention to address impairments, improve QOL, and attain therapy  goals.    OBJECTIVE IMPAIRMENTS: Abnormal gait, decreased activity tolerance, decreased balance, and difficulty walking.   ACTIVITY LIMITATIONS: lifting and locomotion level  PARTICIPATION LIMITATIONS: community activity, occupation, and yard work  PERSONAL FACTORS: Age are also affecting patient's functional outcome.   REHAB POTENTIAL: Excellent  CLINICAL DECISION MAKING:  Stable/uncomplicated  EVALUATION COMPLEXITY: Low  PLAN:  PT FREQUENCY: 1-2x/week  PT DURATION: 8 weeks  PLANNED INTERVENTIONS: Therapeutic exercises, Therapeutic activity, Neuromuscular re-education, Balance training, Gait training, Patient/Family education, Self Care, Joint mobilization, Stair training, Dry Needling, Manual therapy, and Re-evaluation  PLAN FOR NEXT SESSION:  Continue to progress LE strength and dynamic balance as appropriate.       Lenda Kelp PT  Physical Therapist- [place]  [facility]

## 2023-06-02 ENCOUNTER — Encounter (INDEPENDENT_AMBULATORY_CARE_PROVIDER_SITE_OTHER): Payer: Self-pay

## 2023-06-03 ENCOUNTER — Ambulatory Visit: Payer: Medicare Other | Attending: Family Medicine | Admitting: Physical Therapy

## 2023-06-03 DIAGNOSIS — M25552 Pain in left hip: Secondary | ICD-10-CM | POA: Diagnosis present

## 2023-06-03 DIAGNOSIS — R269 Unspecified abnormalities of gait and mobility: Secondary | ICD-10-CM | POA: Diagnosis present

## 2023-06-03 DIAGNOSIS — M545 Low back pain, unspecified: Secondary | ICD-10-CM | POA: Diagnosis present

## 2023-06-03 DIAGNOSIS — M6281 Muscle weakness (generalized): Secondary | ICD-10-CM | POA: Diagnosis present

## 2023-06-03 NOTE — Therapy (Signed)
OUTPATIENT PHYSICAL THERAPY NEURO TREATMENT    Patient Name: Shannon Chung MRN: [id number] DOB:[birth year], 82 y.o., male Today's Date: 06/03/2023   PCP: Dana Allan, MD  REFERRING PROVIDER:   Dana Allan, MD    END OF SESSION:  PT End of Session - 06/03/23 0839     Visit Number 15    Number of Visits 33    Date for PT Re-Evaluation 08/05/23    Progress Note Due on Visit 20    PT Start Time 0844    PT Stop Time 0927    PT Time Calculation (min) 43 min    Equipment Utilized During Treatment Gait belt    Activity Tolerance Patient tolerated treatment well;No increased pain    Behavior During Therapy [facility] for tasks assessed/performed                      Past Medical History:  Diagnosis Date   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 06/2015 Cardiac CT: Ca score 1103 (84th %'ile);  b. 07/2015 Cath: LM 70, LAD 80p, 100/11m, D1 70, D2 95, RI 75, RCA 100p/m;  c. 07/2015 CABG x 5 (LIMA->LAD, VG->Diag, VG->OM1->OM2, VG->OM3).   COVID-19    12/2021   COVID-19 01/18/2022   COVID-19 vaccine administered 01/18/2022   Unknown how many vaccine doses have been received. Entered from Emergency Triage Note.   Diabetes mellitus without complication (HCC) 07/2015   Dyslipidemia    Essential hypertension    Essential hypertension 01/02/2015   Formatting of this note might be different from the original.  Last Assessment & Plan:   Chronic, stable. Continue current regimen.   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 dilated, EF 35% improved to 45-50% (2015);  b. 07/2015 EF 25-35% by LV gram.   Ischemic cardiomyopathy 01/02/2015   Kidney stones 04/17/2021   Lone atrial fibrillation (HCC) 1983   a. isolated episode, not on OAC.   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 06/2014: LV; resolved with coumadin-->no residual on f/u echo, no longer on coumadin.  Mural thrombus of heart 08/26/2021   Formatting of this note might be different from the original. Jun 16, 2019 Entered By: [name] Comment: left ventricle, cardiac apex   Osteoarthritis    a. 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 Jun 16, 2019 Entered By: [name] Comment: leftAug 19, 2020 Entered By: [name] Comment: resolved by total left knee replacement Jun 16, 2019 Entered By: [name] Comment: leftAug 19, 2020 Entered By: [name] Comment: resolved by total left knee replacement   Trigger finger of left hand 07/07/2019   Vitamin D deficiency    Past Surgical History:  Procedure Laterality Date   BICEPS TENDON REPAIR Right 1993   CARDIAC CATHETERIZATION N/A 07/05/2015   Procedure: Left Heart Cath and Coronary Angiography;  Surgeon: Antonieta Iba, MD;  Location: [facility] INVASIVE CV LAB;  Service: Cardiovascular;  Laterality: N/A;   CAROTID PTA/STENT INTERVENTION Left 04/26/2023   Procedure: CAROTID PTA/STENT INTERVENTION;  Surgeon: Annice Needy, MD;  Location: [facility] INVASIVE CV LAB;  Service: Cardiovascular;  Laterality: Left;   CATARACT EXTRACTION Left 12/2016   with keratoplasty   COLONOSCOPY  2007   COLONOSCOPY WITH PROPOFOL N/A 12/02/2017   TA, SSA, rpt 3 yrs(Tahiliani, Varnita B, MD)   COLONOSCOPY WITH PROPOFOL N/A 11/26/2020   Procedure: COLONOSCOPY WITH PROPOFOL;  Surgeon: Midge Minium, MD;  Location: [facility] ENDOSCOPY;  Service: Endoscopy;  Laterality: N/A;   CORONARY ARTERY BYPASS GRAFT N/A 07/29/2015   Procedure: CORONARY ARTERY BYPASS GRAFTING (CABG) x 5 (LIMA to LAD, SVG to DIAGONAL,  SVG SEQUENTIALLY to OM1 and OM2, SVG to OM3) with Endoscopic Vein Havesting of  GREATER SAPHENOUS VEIN from RIGHT THIGH and partial LOWER LEG ;  Surgeon: Alleen Borne, MD;  Location: [facility] OR;  Service: Open Heart Surgery;  Laterality: N/A;   EYE SURGERY     b/l cataract and cornea replaced    HAND SURGERY     left hand 1st/2nd trigger fingers Dr. Hyacinth Meeker ortho    JOINT  REPLACEMENT     KNEE ARTHROSCOPY Left remote   MOHS SURGERY     left cheek scc 2022 Dr. Jeannine Boga   MOHS SURGERY     x 5 facial scc   right biceps tendon     repair/re attachment    SKIN CANCER EXCISION  10/2015   BCC - L ala (pending MOHs) and L scapula (complete excision)   TEE WITHOUT CARDIOVERSION N/A 07/29/2015   Procedure: TRANSESOPHAGEAL ECHOCARDIOGRAM (TEE);  Surgeon: Alleen Borne, MD;  Location: [facility] OR;  Service: Open Heart Surgery;  Laterality: N/A;   TONSILLECTOMY  1949   TOTAL KNEE ARTHROPLASTY Left 03/18/2016   cemented L TKR; Deeann Saint,  MD   Patient Active Problem List   Diagnosis Date Noted   TIA (transient ischemic attack) 05/02/2023   GERD without esophagitis 05/02/2023   Carotid stenosis, symptomatic, with infarction (HCC) 04/26/2023   History of CVA (cerebrovascular accident) without residual deficits 03/04/2023   Acute CVA (cerebrovascular accident) (HCC) 02/22/2023   Chronic radicular pain of lower back 08/10/2022   Cervical spondylosis 03/11/2022   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   Bilateral carotid artery stenosis 03/11/2022   Lumbar spondylosis 10/07/2021   DDD (degenerative disc disease), lumbar 10/07/2021   History of radiation therapy 08/26/2021   Aortic atherosclerosis (HCC) 04/17/2021   Diverticulosis 04/17/2021   Hypertension associated with diabetes (HCC) 10/07/2020   Overweight (BMI 25.0-29.9) 10/07/2020   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   Vitamin D deficiency 08/01/2020   Polyp of sigmoid colon 08/01/2020   Insomnia 06/07/2020   Gastroesophageal reflux disease 04/04/2020   Degenerative joint disease of hand 03/01/2020   BPH (benign prostatic hyperplasia) 08/05/2018   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   Macular scar of both eyes 04/20/2018   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   Scotoma involving central area of both eyes 04/20/2018   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   Fatty liver 03/25/2018   Erectile dysfunction  02/23/2017   Hx of CABG 01/24/2017   Advanced care planning/counseling discussion 10/21/2015   Coronary artery disease of native artery of native heart with stable angina pectoris (HCC)    DM2 (diabetes mellitus, type 2) (HCC) 08/12/2015   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   Dyslipidemia 01/02/2015   Essential hypertension 01/02/2015   Osteoarthritis 01/02/2015   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/02/2014    ONSET DATE: 02/22/23  REFERRING DIAG: Z86.73 (ICD-10-CM) - History of CVA (cerebrovascular accident)   THERAPY DIAG:  Abnormality of gait and mobility  Muscle weakness (generalized)  Rationale for Evaluation and Treatment: Rehabilitation  SUBJECTIVE:                                                                                                                                                                                             SUBJECTIVE STATEMENT: Patient reports no new news or issues since last visit.   Pt accompanied by: self  PERTINENT HISTORY:  Lamonta Fradette is a 81yoM who presents to OPPT for evaluation on 03/10/23, reports having a stroke in April 2024. Pt presented to the ED 02/22/2023 with acute onset right-sided weakness, he received TNK. CTA head/neck with left ICA stenosis of 70%, right ICA stenosis of 60%. Brain MRI, head CT showed no acute process. Pt evaluated by acute PT 4/23, able to AMB household distances without device, pt reports  near-baseline gait without frank acute deviation. Pt DC home with 2w holter monitor, followed by [facility]. At outpatient neurology FU, pt was free of any residual symptoms. Pt denies any falls, pain, or LOB in the 6 months preceding evaluation here. 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 Pt underwent carotid stent placement on 04/26/23 c Dr. Festus Barren vascular surgery, post procedure code-stroke called due to changes in expressive language fluency per family, MRI negative for acute process, neurology reporting  TIA in setting of post-procedure hypoperfusion/hypotension. Pt presented to ED 05/02/23 with repeat word-finding issues, MRI negative for acute process, but EEG abnormal and neurology placing on Keppra. Acute PT eval 7/1 reports pt at baseline level of function. Neurology asked pt to refrain from driving for 6 months at that time. PMH: CAD s/p CABG, carotid stenosis, HTN, HLD, T2DM, CVA, Left TKA, chronic lumbar spine DJD and back pain, cervical spondylosis, BPH, insomnia.   PAIN:  Are you having pain? No, right knee feels good   PRECAUTIONS: None  WEIGHT BEARING RESTRICTIONS: No  FALLS: Has patient fallen in last 6 months? No  PATIENT GOALS: To ensure he is physically at the level he was prior to the stroke in April.   OBJECTIVE:    TODAY'S TREATMENT:     06/03/23                                                                                                                   Exercise/Activity Sets/ Reps/Time/ Resistance Assistance Charge type Comments- Unless otherwise stated, CGA was provided and gait belt donned in order to ensure pt safety   Nustep interval training Level 3 x 1 min  Level 5 x 1 min  Level 2 x 1 min  Level 4 x 1 min Level 6 x 1 min    Set up and continuous monitoring for cardiovascular response and to keep SPM > 50. TE  HR after 1 min = 76 bpm and O2 sat=97 HR after 6 min = 79 bpm and O2 sat=97 Total distance = 6 min  Standing high knee march  15 reps each LE with UE support on airex and 15 reps without UE support on firm support  CGA NMR *much improved ability to perform without UE support on firm surface  Heel raise with UE support for steadying   2*20 reps with 3 sec hold   TE  VC to hold 3 sec.   Static standing on incline board with pertubations A/P/Lateral at shoulders and later hips    X several min- in // bars CGA- with PT performing physical pertubations NMR  Good overall balance reaction with no LOB  Ambulation negative split time training on 69M walk area   X 4 reps of ambulation at 10 M with 3# AW   Gait training  1) 10.25 sec  2) 10.05 sec 3) 9.85 sec  4) 9.60 sec  5)10.24 sec  Rear lunges with 3# AW  X 10 ea 3# AW  CGA,  UE  TE  VC for proper posture and for large step length each rep   Side lunges  X 10 ea 3# AW    TE   High marches  2 X 10 ea 3# AW   TE   Leg press  X 10 @ 70#  2 x 10 @ 85#  TE Set to 5 spots above so pt would be able to tolerate "scrunched position"     PATIENT EDUCATION: Education details: POC Person educated: Patient Education method: Explanation Education comprehension: verbalized understanding  HOME EXERCISE PROGRAM:  Access Code: [facility] URL: [email or web address] Date: 05/04/2023 Prepared by: Maureen Ralphs  Exercises - Seated Hip Flexion March with Ankle Weights  - 3 x weekly - 3 sets - 10 reps - Seated Long Arc Quad  - 3 x weekly - 3 sets - 10 reps - Seated Heel Raise  - 3 x weekly - 3 sets - 10 reps - Seated Toe Raise  - 3 x weekly - 3 sets - 10 reps - Seated Hip Abduction with Resistance  - 1 x daily - 3 x weekly - 3 sets - 10 reps    Access Code: [email or web address] URL: [email or web address] Date: 03/17/2023 Prepared by: Thresa Ross  Exercises - Standing Single Leg Stance with Counter Support  - 1 x daily - 7 x weekly - 2 sets - 30 second  hold - Tandem Stance  - 1 x daily - 7 x weekly - 2 sets - 30 second  hold - Forward Step Touch  - 1 x daily - 7 x weekly - 2 sets - 10 reps - Standing March with Counter Support  - 1 x daily - 7 x weekly - 2 sets - 10 reps  GOALS: Goals reviewed with patient? Yes  SHORT TERM GOALS: Target date: 04/07/2023    Patient will be independent in home exercise program to improve strength/mobility for better functional independence with ADLs. Baseline: No HEP currently: 05/13/2023= patient reports feeling good about recent HEP and able to complete all HEP to date Goal status: MET   LONG TERM GOALS: Target date: 08/05/2023  1.   Patient will increase FOTO score to equal to or greater than  77   to demonstrate statistically significant improvement in mobility and quality of life.  Baseline: 67; 05/13/2023= 61 Goal status: Ongoing   2.  Patient will increase Berg Balance score by > 6 points to demonstrate decreased fall risk during functional activities. Baseline: 43; 05/13/2023=44 Goal status: Progressing   3.  Patient will improve DGI by 4 points to reduce fall risk and demonstrate improved transfer/gait ability. Baseline: 17; 05/13/2023= 17 Goal status: Ongoing 4.   Patient will increase six minute walk test distance to >1000 for progression to community ambulator and improve gait ability Baseline: visit 2 test 03/17/23: 850 ft; 05/13/2023= 830 feet with wide BOS Goal status: Ongoing    ASSESSMENT:  CLINICAL IMPRESSION:  Patient presents with good overall motivation for today's session. He progressed with strengthening and gait interventions this session.  Pt will continue to benefit from skilled physical therapy intervention to address impairments, improve QOL, and attain therapy goals.    OBJECTIVE IMPAIRMENTS: Abnormal gait, decreased activity tolerance, decreased balance, and difficulty walking.   ACTIVITY LIMITATIONS: lifting and locomotion level  PARTICIPATION LIMITATIONS: community activity, occupation, and yard work  PERSONAL FACTORS: Age are also affecting patient's functional outcome.   REHAB POTENTIAL: Excellent  CLINICAL DECISION MAKING: Stable/uncomplicated  EVALUATION COMPLEXITY: Low  PLAN:  PT  FREQUENCY: 1-2x/week  PT DURATION: 8 weeks  PLANNED INTERVENTIONS: Therapeutic exercises, Therapeutic activity, Neuromuscular re-education, Balance training, Gait training, Patient/Family education, Self Care, Joint mobilization, Stair training, Dry Needling, Manual therapy, and Re-evaluation  PLAN FOR NEXT SESSION:  Continue to progress LE strength and dynamic balance as appropriate.        Norman Herrlich PT  Physical Therapist- [place]  [facility]

## 2023-06-04 ENCOUNTER — Encounter: Payer: Self-pay | Admitting: Family Medicine

## 2023-06-04 NOTE — Assessment & Plan Note (Signed)
Chronic Well controlled Continue Metoprolol XL 25 mg daily Continue Lisinopril 10 mg daily

## 2023-06-04 NOTE — Assessment & Plan Note (Addendum)
Readmission to [facility] for cryptogenic stroke. Symptoms improving.  Mild difficulty with word finding. Check CBC Continue DAPT Continue statin therapy Continue Keppra per Neurology Maintain blood pressure control less than 130/80 Zio patch in place Referral sent for physical therapy Neurology scheduled for evaluation 08/13   Cardiology scheduled for 07/11 Follow up as needed

## 2023-06-08 ENCOUNTER — Ambulatory Visit: Payer: Medicare Other | Admitting: Physical Therapy

## 2023-06-08 ENCOUNTER — Other Ambulatory Visit (INDEPENDENT_AMBULATORY_CARE_PROVIDER_SITE_OTHER): Payer: Self-pay | Admitting: Vascular Surgery

## 2023-06-08 ENCOUNTER — Other Ambulatory Visit: Payer: Self-pay | Admitting: Family

## 2023-06-08 DIAGNOSIS — I6523 Occlusion and stenosis of bilateral carotid arteries: Secondary | ICD-10-CM

## 2023-06-08 DIAGNOSIS — I10 Essential (primary) hypertension: Secondary | ICD-10-CM

## 2023-06-08 DIAGNOSIS — M6281 Muscle weakness (generalized): Secondary | ICD-10-CM

## 2023-06-08 DIAGNOSIS — R269 Unspecified abnormalities of gait and mobility: Secondary | ICD-10-CM

## 2023-06-08 NOTE — Therapy (Signed)
OUTPATIENT PHYSICAL THERAPY NEURO TREATMENT    Patient Name: Shannon Chung MRN: [id number] DOB:08-31-1941, 82 y.o., male Today's Date: 06/08/2023   PCP: Dana Allan, MD  REFERRING PROVIDER:   Dana Allan, MD    END OF SESSION:  PT End of Session - 06/08/23 1109     Visit Number 16    Number of Visits 33    Date for PT Re-Evaluation 08/05/23    Progress Note Due on Visit 20    PT Start Time 1102    PT Stop Time 1144    PT Time Calculation (min) 42 min    Equipment Utilized During Treatment Gait belt    Activity Tolerance Patient tolerated treatment well;No increased pain    Behavior During Therapy [facility] for tasks assessed/performed                      Past Medical History:  Diagnosis Date   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 06/2015 Cardiac CT: Ca score 1103 (84th %'ile);  b. 07/2015 Cath: LM 70, LAD 80p, 100/4m, D1 70, D2 95, RI 75, RCA 100p/m;  c. 07/2015 CABG x 5 (LIMA->LAD, VG->Diag, VG->OM1->OM2, VG->OM3).   COVID-19    12/2021   COVID-19 01/18/2022   COVID-19 vaccine administered 01/18/2022   Unknown how many vaccine doses have been received. Entered from Emergency Triage Note.   Diabetes mellitus without complication (HCC) 07/2015   Dyslipidemia    Essential hypertension    Essential hypertension 01/02/2015   Formatting of this note might be different from the original.  Last Assessment & Plan:   Chronic, stable. Continue current regimen.   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 dilated, EF 35% improved to 45-50% (2015);  b. 07/2015 EF 25-35% by LV gram.   Ischemic cardiomyopathy 01/02/2015   Kidney stones 04/17/2021   Lone atrial fibrillation (HCC) 1983   a. isolated episode, not on OAC.   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 06/2014: LV; resolved with coumadin-->no residual on f/u echo, no longer on coumadin.  Mural thrombus of heart 08/26/2021   Formatting of this note might be different from the original. Jun 16, 2019 Entered By: [name] Comment: left ventricle, cardiac apex   Osteoarthritis    a. 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 Jun 16, 2019 Entered By: [name] Comment: leftAug 19, 2020 Entered By: [name] Comment: resolved by total left knee replacement Jun 16, 2019 Entered By: [name] Comment: leftAug 19, 2020 Entered By: [name] Comment: resolved by total left knee replacement   Trigger finger of left hand 07/07/2019   Vitamin D deficiency    Past Surgical History:  Procedure Laterality Date   BICEPS TENDON REPAIR Right 1993   CARDIAC CATHETERIZATION N/A 07/05/2015   Procedure: Left Heart Cath and Coronary Angiography;  Surgeon: Antonieta Iba, MD;  Location: [facility] INVASIVE CV LAB;  Service: Cardiovascular;  Laterality: N/A;   CAROTID PTA/STENT INTERVENTION Left 04/26/2023   Procedure: CAROTID PTA/STENT INTERVENTION;  Surgeon: Annice Needy, MD;  Location: [facility] INVASIVE CV LAB;  Service: Cardiovascular;  Laterality: Left;   CATARACT EXTRACTION Left 12/2016   with keratoplasty   COLONOSCOPY  2007   COLONOSCOPY WITH PROPOFOL N/A 12/02/2017   TA, SSA, rpt 3 yrs(Tahiliani, Varnita B, MD)   COLONOSCOPY WITH PROPOFOL N/A 11/26/2020   Procedure: COLONOSCOPY WITH PROPOFOL;  Surgeon: Midge Minium, MD;  Location: [facility] ENDOSCOPY;  Service: Endoscopy;  Laterality: N/A;   CORONARY ARTERY BYPASS GRAFT N/A 07/29/2015   Procedure: CORONARY ARTERY BYPASS GRAFTING (CABG) x 5 (LIMA to LAD, SVG to DIAGONAL,  SVG SEQUENTIALLY to OM1 and OM2, SVG to OM3) with Endoscopic Vein Havesting of  GREATER SAPHENOUS VEIN from RIGHT THIGH and partial LOWER LEG ;  Surgeon: Alleen Borne, MD;  Location: [facility] OR;  Service: Open Heart Surgery;  Laterality: N/A;   EYE SURGERY     b/l cataract and cornea replaced    HAND SURGERY     left hand 1st/2nd trigger fingers Dr. Hyacinth Meeker ortho    JOINT  REPLACEMENT     KNEE ARTHROSCOPY Left remote   MOHS SURGERY     left cheek scc 2022 Dr. Jeannine Boga   MOHS SURGERY     x 5 facial scc   right biceps tendon     repair/re attachment    SKIN CANCER EXCISION  10/2015   BCC - L ala (pending MOHs) and L scapula (complete excision)   TEE WITHOUT CARDIOVERSION N/A 07/29/2015   Procedure: TRANSESOPHAGEAL ECHOCARDIOGRAM (TEE);  Surgeon: Alleen Borne, MD;  Location: [facility] OR;  Service: Open Heart Surgery;  Laterality: N/A;   TONSILLECTOMY  1949   TOTAL KNEE ARTHROPLASTY Left 03/18/2016   cemented L TKR; Deeann Saint,  MD   Patient Active Problem List   Diagnosis Date Noted   TIA (transient ischemic attack) 05/02/2023   GERD without esophagitis 05/02/2023   Carotid stenosis, symptomatic, with infarction (HCC) 04/26/2023   History of CVA (cerebrovascular accident) without residual deficits 03/04/2023   Acute CVA (cerebrovascular accident) (HCC) 02/22/2023   Chronic radicular pain of lower back 08/10/2022   Cervical spondylosis 03/11/2022   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   Bilateral carotid artery stenosis 03/11/2022   Lumbar spondylosis 10/07/2021   DDD (degenerative disc disease), lumbar 10/07/2021   History of radiation therapy 08/26/2021   Aortic atherosclerosis (HCC) 04/17/2021   Diverticulosis 04/17/2021   Hypertension associated with diabetes (HCC) 10/07/2020   Overweight (BMI 25.0-29.9) 10/07/2020   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   Vitamin D deficiency 08/01/2020   Polyp of sigmoid colon 08/01/2020   Insomnia 06/07/2020   Gastroesophageal reflux disease 04/04/2020   Degenerative joint disease of hand 03/01/2020   BPH (benign prostatic hyperplasia) 08/05/2018   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   Macular scar of both eyes 04/20/2018   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   Scotoma involving central area of both eyes 04/20/2018   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   Fatty liver 03/25/2018   Erectile dysfunction  02/23/2017   Hx of CABG 01/24/2017   Advanced care planning/counseling discussion 10/21/2015   Coronary artery disease of native artery of native heart with stable angina pectoris (HCC)    DM2 (diabetes mellitus, type 2) (HCC) 08/12/2015   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   Dyslipidemia 01/02/2015   Essential hypertension 01/02/2015   Osteoarthritis 01/02/2015   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/02/2014    ONSET DATE: 02/22/23  REFERRING DIAG: Z86.73 (ICD-10-CM) - History of CVA (cerebrovascular accident)   THERAPY DIAG:  Abnormality of gait and mobility  Muscle weakness (generalized)  Rationale for Evaluation and Treatment: Rehabilitation  SUBJECTIVE:                                                                                                                                                                                             SUBJECTIVE STATEMENT: Pt reports doing well today. Pt denies any recent falls/stumbles since prior session. Pt denies any updates to medications or medical appointment since prior session. Pt reports good compliance with HEP when time permits.    Pt accompanied by: self  PERTINENT HISTORY:  Detavious Bainbridge is a 81yoM who presents to OPPT for evaluation on 03/10/23, reports having a stroke in April 2024. Pt presented to the ED 02/22/2023 with acute onset right-sided weakness, he received TNK. CTA head/neck with left ICA stenosis of 70%, right ICA stenosis  of 60%. Brain MRI, head CT showed no acute process. Pt evaluated by acute PT 4/23, able to AMB household distances without device, pt reports near-baseline gait without frank acute deviation. Pt DC home with 2w holter monitor, followed by [facility]. At outpatient neurology FU, pt was free of any residual symptoms. Pt denies any falls, pain, or LOB in the 6 months preceding evaluation here. 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 Pt underwent carotid stent placement on 04/26/23 c Dr.  Festus Barren vascular surgery, post procedure code-stroke called due to changes in expressive language fluency per family, MRI negative for acute process, neurology reporting TIA in setting of post-procedure hypoperfusion/hypotension. Pt presented to ED 05/02/23 with repeat word-finding issues, MRI negative for acute process, but EEG abnormal and neurology placing on Keppra. Acute PT eval 7/1 reports pt at baseline level of function. Neurology asked pt to refrain from driving for 6 months at that time. PMH: CAD s/p CABG, carotid stenosis, HTN, HLD, T2DM, CVA, Left TKA, chronic lumbar spine DJD and back pain, cervical spondylosis, BPH, insomnia.   PAIN:  Are you having pain? No, right knee feels good   PRECAUTIONS: None  WEIGHT BEARING RESTRICTIONS: No  FALLS: Has patient fallen in last 6 months? No  PATIENT GOALS: To ensure he is physically at the level he was prior to the stroke in April.   OBJECTIVE:    TODAY'S TREATMENT:     06/08/23                                                                                                                   Exercise/Activity Sets/ Reps/Time/ Resistance Assistance Charge type Comments- Unless otherwise stated, CGA was provided and gait belt donned in order to ensure pt safety   Nustep interval training Level 3 x 1 min  Level 5 x 1 min  Level 3 x 1 min  Level 5 x 1 min Level 3 x 1 min Level 6 x 1 min     Set up and continuous monitoring for cardiovascular response and to keep SPM > 50. TE  HR after 1 min = 76 bpm and O2 sat=97 HR after 6 min = 79 bpm and O2 sat=97 Total distance = 6 min  Ladder drills for NBOS and for coordination  X 10  times through   CGA NMR Forward gait, ot rends to externaly rotate L hip for increased lateral balance in SLS on the left. Difficulties with keeping balance when LLE was in proper forward progression without ER of the hip  Heel raise with UE support for steadying   2*20 reps with 3 sec hold   TE  VC to hold 3 sec.    Lateral stepping with band  RTB 2 sets of 4 lengths of // bars ( 2 lengs each side)   TE   Wide tandem balance with ball toss  X 20 ball tosses ea LE  NMR   Forward to Rear lunges with 3# AW  X  10 ea 3# AW  CGA, UE  TE  VC for proper posture and for large step length each rep     PATIENT EDUCATION: Education details: POC Person educated: Patient Education method: Explanation Education comprehension: verbalized understanding  HOME EXERCISE PROGRAM:  Access Code: [facility] URL: [email or web address] Date: 05/04/2023 Prepared by: Maureen Ralphs  Exercises - Seated Hip Flexion March with Ankle Weights  - 3 x weekly - 3 sets - 10 reps - Seated Long Arc Quad  - 3 x weekly - 3 sets - 10 reps - Seated Heel Raise  - 3 x weekly - 3 sets - 10 reps - Seated Toe Raise  - 3 x weekly - 3 sets - 10 reps - Seated Hip Abduction with Resistance  - 1 x daily - 3 x weekly - 3 sets - 10 reps    Access Code: [email or web address] URL: [email or web address] Date: 03/17/2023 Prepared by: Thresa Ross  Exercises - Standing Single Leg Stance with Counter Support  - 1 x daily - 7 x weekly - 2 sets - 30 second  hold - Tandem Stance  - 1 x daily - 7 x weekly - 2 sets - 30 second  hold - Forward Step Touch  - 1 x daily - 7 x weekly - 2 sets - 10 reps - Standing March with Counter Support  - 1 x daily - 7 x weekly - 2 sets - 10 reps  GOALS: Goals reviewed with patient? Yes  SHORT TERM GOALS: Target date: 04/07/2023    Patient will be independent in home exercise program to improve strength/mobility for better functional independence with ADLs. Baseline: No HEP currently: 05/13/2023= patient reports feeling good about recent HEP and able to complete all HEP to date Goal status: MET   LONG TERM GOALS: Target date: 08/05/2023  1.  Patient will increase FOTO score to equal to or greater than  77   to demonstrate statistically significant improvement in mobility and quality of life.   Baseline: 67; 05/13/2023= 61 Goal status: Ongoing   2.  Patient will increase Berg Balance score by > 6 points to demonstrate decreased fall risk during functional activities. Baseline: 43; 05/13/2023=44 Goal status: Progressing   3.  Patient will improve DGI by 4 points to reduce fall risk and demonstrate improved transfer/gait ability. Baseline: 17; 05/13/2023= 17 Goal status: Ongoing 4.   Patient will increase six minute walk test distance to >1000 for progression to community ambulator and improve gait ability Baseline: visit 2 test 03/17/23: 850 ft; 05/13/2023= 830 feet with wide BOS Goal status: Ongoing    ASSESSMENT:  CLINICAL IMPRESSION:   Patient presents with good overall motivation for today's session. He progressed with strengthening and gait interventions this session. Pt challenged with ladder walking to facilitate decreased width of support with ambulation. Pt reports challenging and further interventions completed later in session to target impairments with this task.  Pt will continue to benefit from skilled physical therapy intervention to address impairments, improve QOL, and attain therapy goals.    OBJECTIVE IMPAIRMENTS: Abnormal gait, decreased activity tolerance, decreased balance, and difficulty walking.   ACTIVITY LIMITATIONS: lifting and locomotion level  PARTICIPATION LIMITATIONS: community activity, occupation, and yard work  PERSONAL FACTORS: Age are also affecting patient's functional outcome.   REHAB POTENTIAL: Excellent  CLINICAL DECISION MAKING: Stable/uncomplicated  EVALUATION COMPLEXITY: Low  PLAN:  PT FREQUENCY: 1-2x/week  PT DURATION: 8 weeks  PLANNED INTERVENTIONS: Therapeutic exercises, Therapeutic activity, Neuromuscular re-education, Balance training, Gait training, Patient/Family education, Self  Care, Joint mobilization, Stair training, Dry Needling, Manual therapy, and Re-evaluation  PLAN FOR NEXT SESSION:  Continue to progress LE  strength and dynamic balance as appropriate.       Norman Herrlich PT  Physical Therapist- [place]  [facility]

## 2023-06-10 ENCOUNTER — Encounter (INDEPENDENT_AMBULATORY_CARE_PROVIDER_SITE_OTHER): Payer: Self-pay | Admitting: Nurse Practitioner

## 2023-06-10 ENCOUNTER — Ambulatory Visit (INDEPENDENT_AMBULATORY_CARE_PROVIDER_SITE_OTHER): Payer: Medicare Other

## 2023-06-10 ENCOUNTER — Ambulatory Visit: Payer: Medicare Other

## 2023-06-10 ENCOUNTER — Ambulatory Visit (INDEPENDENT_AMBULATORY_CARE_PROVIDER_SITE_OTHER): Payer: Medicare Other | Admitting: Nurse Practitioner

## 2023-06-10 VITALS — BP 123/73 | HR 62 | Resp 16 | Wt 202.0 lb

## 2023-06-10 DIAGNOSIS — M6281 Muscle weakness (generalized): Secondary | ICD-10-CM

## 2023-06-10 DIAGNOSIS — E1169 Type 2 diabetes mellitus with other specified complication: Secondary | ICD-10-CM

## 2023-06-10 DIAGNOSIS — I152 Hypertension secondary to endocrine disorders: Secondary | ICD-10-CM

## 2023-06-10 DIAGNOSIS — I6523 Occlusion and stenosis of bilateral carotid arteries: Secondary | ICD-10-CM | POA: Diagnosis not present

## 2023-06-10 DIAGNOSIS — R269 Unspecified abnormalities of gait and mobility: Secondary | ICD-10-CM

## 2023-06-10 DIAGNOSIS — M25552 Pain in left hip: Secondary | ICD-10-CM

## 2023-06-10 DIAGNOSIS — M545 Low back pain, unspecified: Secondary | ICD-10-CM

## 2023-06-10 DIAGNOSIS — E1159 Type 2 diabetes mellitus with other circulatory complications: Secondary | ICD-10-CM

## 2023-06-10 NOTE — Therapy (Signed)
OUTPATIENT PHYSICAL THERAPY TREATMENT    Patient Name: Shannon Chung MRN: [id number] DOB:04-02-1941, 82 y.o., male Today's Date: 06/10/2023   PCP: Dana , MD  REFERRING PROVIDER:   Dana , MD    END OF SESSION:  PT End of Session - 06/10/23 1228     Visit Number 17    Number of Visits 33    Date for PT Re-Evaluation 08/05/23    Authorization Type [employer] Medicare    Authorization Time Period 05/13/23-08/05/23    Progress Note Due on Visit 20    PT Start Time 1145    PT Stop Time 1225    PT Time Calculation (min) 40 min    Activity Tolerance Patient tolerated treatment well;No increased pain;Patient limited by fatigue    Behavior During Therapy 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 06/2015 Cardiac CT: Ca score 1103 (84th %'ile);  b. 07/2015 Cath: LM 70, LAD 80p, 100/28m, D1 70, D2 95, RI 75, RCA 100p/m;  c. 07/2015 CABG x 5 (LIMA->LAD, VG->Diag, VG->OM1->OM2, VG->OM3).   COVID-19    12/2021   COVID-19 01/18/2022   COVID-19 vaccine administered 01/18/2022   Unknown how many vaccine doses have been received. Entered from Emergency Triage Note.   Diabetes mellitus without complication (HCC) 07/2015   Dyslipidemia    Essential hypertension    Essential hypertension 01/02/2015   Formatting of this note might be different from the original.  Last Assessment & Plan:   Chronic, stable. Continue current regimen.   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 dilated, EF 35% improved to 45-50% (2015);  b. 07/2015 EF 25-35% by LV gram.   Ischemic cardiomyopathy 01/02/2015   Kidney stones 04/17/2021   Lone atrial fibrillation (HCC) 1983   a. isolated episode, not on OAC.   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 06/2014: LV; resolved with coumadin-->no residual on f/u echo, no longer on  coumadin.   Mural thrombus of heart 08/26/2021   Formatting of this note might be different from the original. Jun 16, 2019 Entered By: [name] Comment: left ventricle, cardiac apex   Osteoarthritis    a. 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 Jun 16, 2019 Entered By: [name] Comment: leftAug 19, 2020 Entered By: [name] Comment: resolved by total left knee replacement Jun 16, 2019 Entered By: [name] Comment: leftAug 19, 2020 Entered By: [name] Comment: resolved by total left knee replacement   Trigger finger of left hand 07/07/2019   Vitamin D deficiency    Past Surgical History:  Procedure Laterality Date   BICEPS TENDON REPAIR Right 1993   CARDIAC CATHETERIZATION N/A 07/05/2015   Procedure: Left Heart Cath and Coronary Angiography;  Surgeon: Antonieta Iba, MD;  Location: [facility] INVASIVE CV LAB;  Service: Cardiovascular;  Laterality: N/A;   CAROTID PTA/STENT INTERVENTION Left 04/26/2023   Procedure: CAROTID PTA/STENT INTERVENTION;  Surgeon: Annice Needy, MD;  Location: [facility] INVASIVE CV LAB;  Service: Cardiovascular;  Laterality: Left;   CATARACT EXTRACTION Left 12/2016   with keratoplasty   COLONOSCOPY  2007   COLONOSCOPY WITH PROPOFOL N/A 12/02/2017   TA, SSA, rpt 3 yrs(Tahiliani, Varnita B, MD)   COLONOSCOPY WITH PROPOFOL N/A 11/26/2020   Procedure: COLONOSCOPY WITH PROPOFOL;  Surgeon: Midge Minium, MD;  Location: [facility] ENDOSCOPY;  Service: Endoscopy;  Laterality: N/A;   CORONARY ARTERY BYPASS GRAFT N/A 07/29/2015   Procedure: CORONARY ARTERY BYPASS GRAFTING (CABG) x 5 (LIMA to LAD, SVG to DIAGONAL,  SVG SEQUENTIALLY to OM1 and OM2, SVG to OM3) with Endoscopic Vein Havesting of  GREATER SAPHENOUS VEIN from RIGHT THIGH and partial LOWER LEG ;  Surgeon: Alleen Borne, MD;  Location: [facility] OR;  Service: Open Heart Surgery;  Laterality: N/A;   EYE SURGERY     b/l cataract and cornea replaced    HAND SURGERY     left hand 1st/2nd trigger  fingers Dr. Hyacinth Meeker ortho    JOINT REPLACEMENT     KNEE ARTHROSCOPY Left remote   MOHS SURGERY     left cheek scc 2022 Dr. Jeannine Boga   MOHS SURGERY     x 5 facial scc   right biceps tendon     repair/re attachment    SKIN CANCER EXCISION  10/2015   BCC - L ala (pending MOHs) and L scapula (complete excision)   TEE WITHOUT CARDIOVERSION N/A 07/29/2015   Procedure: TRANSESOPHAGEAL ECHOCARDIOGRAM (TEE);  Surgeon: Alleen Borne, MD;  Location: [facility] OR;  Service: Open Heart Surgery;  Laterality: N/A;   TONSILLECTOMY  1949   TOTAL KNEE ARTHROPLASTY Left 03/18/2016   cemented L  TKR; Deeann Saint, MD   Patient Active Problem List   Diagnosis Date Noted   TIA (transient ischemic attack) 05/02/2023   GERD without esophagitis 05/02/2023   Carotid stenosis, symptomatic, with infarction (HCC) 04/26/2023   History of CVA (cerebrovascular accident) without residual deficits 03/04/2023   Acute CVA (cerebrovascular accident) (HCC) 02/22/2023   Chronic radicular pain of lower back 08/10/2022   Cervical spondylosis 03/11/2022   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   Bilateral carotid artery stenosis 03/11/2022   Lumbar spondylosis 10/07/2021   DDD (degenerative disc disease), lumbar 10/07/2021   History of radiation therapy 08/26/2021   Aortic atherosclerosis (HCC) 04/17/2021   Diverticulosis 04/17/2021   Hypertension associated with diabetes (HCC) 10/07/2020   Overweight (BMI 25.0-29.9) 10/07/2020   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   Vitamin D deficiency 08/01/2020   Polyp of sigmoid colon 08/01/2020   Insomnia 06/07/2020   Gastroesophageal reflux disease 04/04/2020   Degenerative joint disease of hand 03/01/2020   BPH (benign prostatic hyperplasia) 08/05/2018   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   Macular scar of both eyes 04/20/2018   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   Scotoma involving central area of both eyes 04/20/2018   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   Fatty liver  03/25/2018   Erectile dysfunction 02/23/2017   Hx of CABG 01/24/2017   Advanced care planning/counseling discussion 10/21/2015   Coronary artery disease of native artery of native heart with stable angina pectoris (HCC)    DM2 (diabetes mellitus, type 2) (HCC) 08/12/2015   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   Dyslipidemia 01/02/2015   Essential hypertension 01/02/2015   Osteoarthritis 01/02/2015   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/02/2014    ONSET DATE: 02/22/23  REFERRING DIAG: Z86.73 (ICD-10-CM) - History of CVA (cerebrovascular accident)   THERAPY DIAG:  Abnormality of gait and mobility  Muscle weakness (generalized)  Bilateral low back pain without sciatica, unspecified chronicity  Pain in left hip  Rationale for Evaluation and Treatment: Rehabilitation  SUBJECTIVE:                                                                                                                                                                                             SUBJECTIVE STATEMENT: Pt reports doing well today. Pt denies any recent falls/stumbles since prior session. Pt denies any updates to medications or medical appointment since prior session. Pt reports good compliance with HEP when time permits.    Pt accompanied by: self  PERTINENT HISTORY:  Continued to progress high level balance this date, advancing volume and reducing recovery intervals between activities. Also incorporated work duty Counselling psychologist. Pt tolerates session well in general, shows  general improvement with balance activities within session. Pt continues to demonstrate excellent progress toward goals of care.   PMH: CAD s/p CABG, carotid stenosis, HTN, HLD, T2DM, CVA, Left TKA, chronic lumbar spine DJD and back pain, cervical spondylosis, BPH, insomnia.   PAIN:  Are you having pain? Yes, right knee at baseline level soreness   PRECAUTIONS: None  WEIGHT BEARING RESTRICTIONS: No  FALLS: Has patient fallen in  last 6 months? No  PATIENT GOALS: To ensure he is physically at the level he was prior to the stroke in April.   OBJECTIVE:    TODAY'S TREATMENT:     06/10/23                                                                                                                   -Nustep 2.5 minutes @ level 3, 2.5 minutes level 4 -AMB overground pushing author in transport chair 1 lap lower level 468ft (220lbs)  -tandem stance balance on 2x4 4x30sec bilat  -139ft AMB transport chair push 300lb then 30x alternating toe taps of medicine ball -192ft AMB transport chair push 300lb then 30x alternating toe taps of medicine ball -154ft AMB transport chair push 300lb then 30x alternating toe taps of medicine ball -147ft AMB transport chair push 300lb then 30x alternating toe taps of medicine ball -170ft AMB transport chair push 300lb then 30x alternating toe taps of medicine ball   PATIENT EDUCATION: Education details: POC Person educated: Patient Education method: Explanation Education comprehension: verbalized understanding  HOME EXERCISE PROGRAM:  Access Code: [facility] URL: [email or web address] Date: 05/04/2023 Prepared by: Maureen Ralphs  Exercises - Seated Hip Flexion March with Ankle Weights  - 3 x weekly - 3 sets - 10 reps - Seated Long Arc Quad  - 3 x weekly - 3 sets - 10 reps - Seated Heel Raise  - 3 x weekly - 3 sets - 10 reps - Seated Toe Raise  - 3 x weekly - 3 sets - 10 reps - Seated Hip Abduction with Resistance  - 1 x daily - 3 x weekly - 3 sets - 10 reps    Access Code: [email or web address] URL: [email or web address] Date: 03/17/2023 Prepared by: Thresa Ross  Exercises - Standing Single Leg Stance with Counter Support  - 1 x daily - 7 x weekly - 2 sets - 30 second  hold - Tandem Stance  - 1 x daily - 7 x weekly - 2 sets - 30 second  hold - Forward Step Touch  - 1 x daily - 7 x weekly - 2 sets - 10 reps - Standing March with Counter Support   - 1 x daily - 7 x weekly - 2 sets - 10 reps  GOALS: Goals reviewed with patient? Yes  SHORT TERM GOALS: Target date: 04/07/2023    Patient will be independent in home exercise program to improve strength/mobility for better functional independence with ADLs. Baseline: No HEP currently: 05/13/2023= patient reports feeling good about recent HEP and able to complete all HEP  to date Goal status: MET   LONG TERM GOALS: Target date: 08/05/2023  1.  Patient will increase FOTO score to equal to or greater than  77   to demonstrate statistically significant improvement in mobility and quality of life.  Baseline: 67; 05/13/2023= 61 Goal status: Ongoing   2.  Patient will increase Berg Balance score by > 6 points to demonstrate decreased fall risk during functional activities. Baseline: 43; 05/13/2023=44 Goal status: Progressing   3.  Patient will improve DGI by 4 points to reduce fall risk and demonstrate improved transfer/gait ability. Baseline: 17; 05/13/2023= 17 Goal status: Ongoing 4.   Patient will increase six minute walk test distance to >1000 for progression to community ambulator and improve gait ability Baseline: visit 2 test 03/17/23: 850 ft; 05/13/2023= 830 feet with wide BOS Goal status: Ongoing    ASSESSMENT:  CLINICAL IMPRESSION:   Patient presents with good overall motivation for today's session. He progressed with strengthening and gait interventions this session. Pt challenged with ladder walking to facilitate decreased width of support with ambulation. Pt reports challenging and further interventions completed later in session to target impairments with this task.  Pt will continue to benefit from skilled physical therapy intervention to address impairments, improve QOL, and attain therapy goals.    OBJECTIVE IMPAIRMENTS: Abnormal gait, decreased activity tolerance, decreased balance, and difficulty walking.   ACTIVITY LIMITATIONS: lifting and locomotion  level  PARTICIPATION LIMITATIONS: community activity, occupation, and yard work  PERSONAL FACTORS: Age are also affecting patient's functional outcome.   REHAB POTENTIAL: Excellent  CLINICAL DECISION MAKING: Stable/uncomplicated  EVALUATION COMPLEXITY: Low  PLAN:  PT FREQUENCY: 1-2x/week  PT DURATION: 8 weeks  PLANNED INTERVENTIONS: Therapeutic exercises, Therapeutic activity, Neuromuscular re-education, Balance training, Gait training, Patient/Family education, Self Care, Joint mobilization, Stair training, Dry Needling, Manual therapy, and Re-evaluation  PLAN FOR NEXT SESSION:  Continue to progress LE strength and dynamic balance as appropriate.    12:50 PM, 06/10/23 Rosamaria Lints, PT, DPT Physical Therapist - [facility]  [phone] (ASCOM)     Rosamaria Lints PT   Physical Therapist- [facility]  [facility]

## 2023-06-10 NOTE — Progress Notes (Signed)
Subjective:    Patient ID: Shannon Chung, male    DOB: 31-Aug-1941, 82 y.o.   MRN: [id number] Chief Complaint  Patient presents with   Follow-up    [facility] 4 week with carotid    Milo Capel is a 82 year old male who presents today for evaluation following a left ICA stent placement on 04/26/2023.  He also had an ICA extender.  This was done following CVA with noted significant stenosis of the left side.  The patient has been working with physical therapy twice a week and has been making great strides in recovery.  He denies any significant aphasia or significant upper extremity weakness.  He does have some tingling and tenderness in the right side of his neck more so with palpation but this could be residual from previous stroke he had several years ago in addition to muscle strain from working with physical therapy  Today noninvasive studies show 1 to 39% stenosis of the right ICA with no significant stenosis of the left ICA post stent placement.    Review of Systems  Musculoskeletal:  Positive for neck pain.  All other systems reviewed and are negative.      Objective:   Physical Exam Vitals reviewed.  HENT:     Head: Normocephalic.  Neck:     Vascular: No carotid bruit.  Cardiovascular:     Rate and Rhythm: Normal rate.     Pulses:          Radial pulses are 2+ on the right side and 2+ on the left side.  Pulmonary:     Effort: Pulmonary effort is normal.  Skin:    General: Skin is warm and dry.  Neurological:     Mental Status: He is alert and oriented to person, place, and time.     Gait: Gait abnormal.  Psychiatric:        Mood and Affect: Mood normal.        Behavior: Behavior normal.        Thought Content: Thought content normal.        Judgment: Judgment normal.     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 06/2015 Cardiac CT: Ca score 1103 (84th %'ile);  b. 07/2015 Cath: LM 70, LAD 80p, 100/55m, D1 70, D2 95, RI 75, RCA 100p/m;  c. 07/2015 CABG x 5 (LIMA->LAD, VG->Diag, VG->OM1->OM2, VG->OM3).   COVID-19    12/2021   COVID-19 01/18/2022   COVID-19 vaccine administered 01/18/2022   Unknown how many vaccine doses have been received. Entered from Emergency Triage Note.   Diabetes mellitus without complication (HCC) 07/2015   Dyslipidemia    Essential hypertension    Essential hypertension 01/02/2015   Formatting of this note might be different from the original.  Last Assessment & Plan:   Chronic, stable. Continue current regimen.  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 dilated, EF 35% improved to 45-50% (2015);  b. 07/2015 EF 25-35% by LV gram.   Ischemic  cardiomyopathy 01/02/2015   Kidney stones 04/17/2021   Lone atrial fibrillation (HCC) 1983   a. isolated episode, not on OAC.   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 06/2014: LV; resolved with coumadin-->no residual on f/u echo, no longer on coumadin.   Mural thrombus of heart 08/26/2021   Formatting of this note might be different from the original. Jun 16, 2019 Entered By: [name] Comment: left ventricle, cardiac apex   Osteoarthritis    a. 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 Jun 16, 2019 Entered By: [name] Comment: leftAug 19, 2020 Entered By: [name] Comment: resolved by total left knee replacement Jun 16, 2019 Entered By: [name] Comment: leftAug 19, 2020 Entered By: [name] Comment: resolved by total left knee replacement   Trigger finger of left hand 07/07/2019   Vitamin D deficiency     Social History   Socioeconomic History   Marital status: Divorced    Spouse name: [place]   Number of children: 2   Years of education: Not on file   Highest education level: Some college, no degree   Occupational History    Comment: retired  Tobacco Use   Smoking status: Former    Current packs/day: 0.00    Average packs/day: 0.5 packs/day for 10.0 years (5.0 ttl pk-yrs)    Types: Cigarettes    Start date: 11/02/1968    Quit date: 11/02/1978    Years since quitting: 44.6   Smokeless tobacco: Never   Tobacco comments:    former smoker 1967-1980 1 pk/week no FH lung  cancer   Vaping Use   Vaping status: Never Used  Substance and Sexual Activity   Alcohol use: Not Currently    Alcohol/week: 4.0 standard drinks of alcohol    Types: 4 Cans of beer per week    Comment: beer/wine on weekends   Drug use: No   Sexual activity: Not Currently    Birth control/protection: Abstinence  Other Topics Concern   Not on file  Social History Narrative   Lives with wife for 56yrs Greta Doom)   Moved from [place] years ago in 2015 to this area    Divorced, one daughter deceased   Retired Electronics engineer    Occupation Retired Clinical research associate   Edu: 1 yr college   Activity: volunteers at [employer]   Diet: good water, fruits/vegetables daily      Tested at risk for OSA in preop for CABG   Social Determinants of Health   Financial Resource Strain: Low Risk  (05/28/2023)   Overall Financial Resource Strain (CARDIA)    Difficulty of Paying Living Expenses: Not hard at all  Food Insecurity: No Food Insecurity (05/28/2023)   Hunger Vital Sign    Worried About Running Out of Food in the Last Year: Never true    Ran Out of Food in the Last Year: Never true  Transportation Needs: No Transportation Needs (05/28/2023)   PRAPARE - Administrator, Civil Service (Medical): No    Lack of Transportation (Non-Medical): No  Physical Activity: Sufficiently Active (05/28/2023)   Exercise Vital Sign    Days of Exercise per Week: 4 days    Minutes of Exercise per Session: 40 min  Recent Concern: Physical Activity - Insufficiently Active (03/01/2023)   Exercise Vital Sign    Days of Exercise per Week: 4 days    Minutes  of Exercise per Session: 20 min  Stress: No Stress Concern Present (05/28/2023)   [employer] of Occupational Health - Occupational Stress Questionnaire    Feeling of Stress : Not at all  Social Connections: Socially Integrated (05/28/2023)   Social Connection and Isolation Panel [NHANES]    Frequency of Communication with Friends and Family: More than three times a week    Frequency of Social Gatherings with Friends and Family: More than three times a week    Attends Religious Services: More than 4 times per year    Active Member of Clubs or Organizations: Yes    Attends Banker Meetings: More than 4 times per year    Marital Status: Married  Catering manager Violence: Not At Risk (05/28/2023)   Humiliation, Afraid, Rape, and Kick questionnaire    Fear of Current or Ex-Partner: No    Emotionally Abused: No    Physically Abused: No    Sexually Abused: No    Past Surgical History:  Procedure Laterality Date   BICEPS TENDON REPAIR Right 1993   CARDIAC CATHETERIZATION N/A 07/05/2015   Procedure: Left Heart Cath and Coronary Angiography;  Surgeon: Antonieta Iba, MD;  Location: [facility] INVASIVE CV LAB;  Service: Cardiovascular;  Laterality: N/A;   CAROTID PTA/STENT INTERVENTION Left 04/26/2023   Procedure: CAROTID PTA/STENT INTERVENTION;  Surgeon: Annice Needy, MD;  Location: [facility] INVASIVE CV LAB;  Service: Cardiovascular;  Laterality: Left;   CATARACT EXTRACTION Left 12/2016   with keratoplasty   COLONOSCOPY  2007   COLONOSCOPY WITH PROPOFOL N/A 12/02/2017   TA, SSA, rpt 3 yrs(Tahiliani, Varnita B, MD)   COLONOSCOPY WITH PROPOFOL N/A 11/26/2020   Procedure:  COLONOSCOPY WITH PROPOFOL;  Surgeon: Midge Minium, MD;  Location[facility] ENDOSCOPY;  Service: Endoscopy;  Laterality: N/A;   CORONARY ARTERY BYPASS GRAFT N/A 07/29/2015   Procedure: CORONARY ARTERY BYPASS GRAFTING (CABG) x 5 (LIMA to LAD, SVG to DIAGONAL,  SVG SEQUENTIALLY to OM1 and OM2, SVG to OM3) with Endoscopic Vein  Havesting of  GREATER SAPHENOUS VEIN from RIGHT THIGH and partial LOWER LEG ;  Surgeon: Alleen Borne, MD;  Location: [facility] OR;  Service: Open Heart Surgery;  Laterality: N/A;   EYE SURGERY     b/l cataract and cornea replaced    HAND SURGERY     left hand 1st/2nd trigger fingers Dr. Hyacinth Meeker ortho    JOINT REPLACEMENT     KNEE ARTHROSCOPY Left remote   MOHS SURGERY     left cheek scc 2022 Dr. Jeannine Boga   MOHS SURGERY     x 5 facial scc   right biceps tendon     repair/re attachment    SKIN CANCER EXCISION  10/2015   BCC - L ala (pending MOHs) and L scapula (complete excision)   TEE WITHOUT CARDIOVERSION N/A 07/29/2015   Procedure: TRANSESOPHAGEAL ECHOCARDIOGRAM (TEE);  Surgeon: Alleen Borne, MD;  Location[facility];  Service: Open Heart Surgery;  Laterality: N/A;   TONSILLECTOMY  1949   TOTAL KNEE ARTHROPLASTY Left 03/18/2016   cemented L TKR; Deeann Saint, MD    Family History  Problem Relation Age of Onset   CAD Father 36       MI   Hypertension Father    Hyperlipidemia Father    Alcoholism Father    Diabetes Father    Alcohol abuse Father    Heart disease Father    Cancer Daughter        dx'ed 27 retroperitoneal liposarcoma     Allergies  Allergen Reactions   Pollen Extract Itching       Latest Ref Rng & Units 05/05/2023   11:40 AM 05/02/2023    8:14 PM 04/28/2023   10:09 AM  CBC  WBC 4.0 - 10.5 K/uL 6.3  7.0  7.1   Hemoglobin 13.0 - 17.0 g/dL 13.0  86.5  78.4   Hematocrit 39.0 - 52.0 % 40.8  40.1  43.1   Platelets 150.0 - 400.0 K/uL 195.0  159  172       CMP     Component Value Date/Time   NA 139 05/05/2023 1140   NA 141 11/28/2014 0000   NA 141 11/28/2014 0000   K 4.1 05/05/2023 1140   CL 102 05/05/2023 1140   CO2 28 05/05/2023 1140   GLUCOSE 211 (H) 05/05/2023 1140   BUN 23 05/05/2023 1140   BUN 12 11/28/2014 0000   CREATININE 0.95 05/05/2023 1140   CREATININE 1.06 09/02/2018 0948   CALCIUM 9.9 05/05/2023 1140   PROT 7.4 05/02/2023 2014   PROT 7.4  01/23/2016 0803   ALBUMIN 4.3 05/02/2023 2014   ALBUMIN 4.8 01/23/2016 0803   ALBUMIN 4.0 11/28/2014 0000   AST 23 05/02/2023 2014   AST 21 11/28/2014 0000   AST 21 11/28/2014 0000   ALT 15 05/02/2023 2014   ALT 27 11/28/2014 0000   ALT 27 11/28/2014 0000   ALKPHOS 51 05/02/2023 2014   ALKPHOS 53 11/28/2014 0000   ALKPHOS 53 11/28/2014 0000   BILITOT 0.6 05/02/2023 2014   BILITOT 0.6 01/23/2016 0803   BILITOT 0.5 11/28/2014 0000   BILITOT 0.5 11/28/2014 0000   GFR 76.01 05/05/2023  1140   GFRNONAA >60 05/02/2023 2014     No results found.     Assessment & Plan:   1. Bilateral carotid artery stenosis Recommend:  The patient is s/p successful left carotid stent  Duplex ultrasound  shows no significant stenosis within the left ICA stent and 1 to 39% stenosis of the right ICA  Continue antiplatelet therapy as prescribed Continue management of CAD, HTN and Hyperlipidemia Healthy heart diet,  encouraged exercise at least 4 times per week  The patient's NIHSS score is as follows: 1 Mild: 1 - 5 Mild to Moderately Severe: 5 - 14 Severe: 15 - 24 Very Severe: >25  There was noted right carotid disease prior to his left intervention.  This may have been overestimated due to the significance of the left and now that there has been intervention the right side showing improvement.  Will reevaluate the right side stenosis at his upcoming visit.  In addition we typically give time to recover from stroke and stent placement for movement with any right-sided repair.  Follow up in 3 months with duplex ultrasound and physical exam based on the patient's carotid intervention.  2. Hypertension associated with diabetes (HCC) Continue antihypertensive medications as already ordered, these medications have been reviewed and there are no changes at this time.  3. Type 2 diabetes mellitus with other specified complication, without long-term current use of insulin (HCC) Continue hypoglycemic  medications as already ordered, these medications have been reviewed and there are no changes at this time.  Hgb A1C to be monitored as already arranged by primary service   Current Outpatient Medications on File Prior to Visit  Medication Sig Dispense Refill   acetaminophen (TYLENOL) 500 MG tablet Take 500 mg by mouth every 6 (six) hours as needed for mild pain.      aspirin 81 MG EC tablet Take 162 mg by mouth daily. Swallow whole.     atorvastatin (LIPITOR) 40 MG tablet Take 1 tablet (40 mg total) by mouth daily.     Blood Pressure KIT Check blood pressure two to three times a week 1 kit 0   Cholecalciferol (VITAMIN D-3) 125 MCG (5000 UT) TABS Take 5,000 Units by mouth daily.     clopidogrel (PLAVIX) 75 MG tablet Take 1 tablet (75 mg total) by mouth daily. 30 tablet 0   ezetimibe (ZETIA) 10 MG tablet Take 1 tablet (10 mg total) by mouth daily. 90 tablet 3   famotidine (PEPCID) 20 MG tablet TAKE 1 TABLET BY MOUTH 2 TIMES DAILY AS NEEDED FOR HEARTBURN/INDIGESTION. D/C ZANTAC 180 tablet 3   fluticasone (FLONASE) 50 MCG/ACT nasal spray Place 1 spray into both nostrils daily as needed for allergies.     lisinopril (ZESTRIL) 10 MG tablet TAKE 1 TABLET BY MOUTH  DAILY 90 tablet 3   metFORMIN (GLUCOPHAGE) 500 MG tablet Take 1 tablet (500 mg total) by mouth 2 (two) times daily with a meal. 180 tablet 3   metoprolol succinate (TOPROL-XL) 25 MG 24 hr tablet TAKE 1 TABLET BY MOUTH ONCE  DAILY 90 tablet 2   prednisoLONE acetate (PRED FORTE) 1 % ophthalmic suspension Place 1 drop into both eyes daily.     sildenafil (REVATIO) 20 MG tablet Take 1 tablet (20 mg total) by mouth daily as needed. 30 tablet 0   vitamin B-12 (CYANOCOBALAMIN) 500 MCG tablet Take 500 mcg by mouth daily.     levETIRAcetam (KEPPRA) 500 MG tablet Take 1 tablet (500 mg total) by mouth 2 (  two) times daily. 60 tablet 0   No current facility-administered medications on file prior to visit.    There are no Patient Instructions on  file for this visit. No follow-ups on file.   Georgiana Spinner, NP

## 2023-06-14 ENCOUNTER — Other Ambulatory Visit: Payer: Self-pay | Admitting: Family Medicine

## 2023-06-14 DIAGNOSIS — N529 Male erectile dysfunction, unspecified: Secondary | ICD-10-CM

## 2023-06-15 ENCOUNTER — Encounter: Payer: Self-pay | Admitting: Cardiology

## 2023-06-15 ENCOUNTER — Ambulatory Visit: Payer: Medicare Other | Attending: Cardiology | Admitting: Cardiology

## 2023-06-15 ENCOUNTER — Ambulatory Visit: Payer: Medicare Other

## 2023-06-15 VITALS — BP 132/70 | HR 64 | Ht 72.0 in | Wt 201.0 lb

## 2023-06-15 DIAGNOSIS — I10 Essential (primary) hypertension: Secondary | ICD-10-CM | POA: Diagnosis not present

## 2023-06-15 DIAGNOSIS — I639 Cerebral infarction, unspecified: Secondary | ICD-10-CM | POA: Diagnosis not present

## 2023-06-15 NOTE — Progress Notes (Signed)
Electrophysiology Office Follow up Visit Note:    Date:  06/15/2023   ID:  Mariann Laster Spikes, DOB Aug 19, 1941, MRN [id number]  PCP:  Dana Allan, MD  [facility] Cardiologist:  Julien Nordmann, MD  [facility] Electrophysiologist:  [name], MD    Interval History:    Ishmael Paller is a 82 y.o. male who presents for a follow up visit.   Last seen May 13, 2023 by Luella Cook.  I have not met the patient previously.  He has a history of coronary artery disease post CABG, ischemic cardiomyopathy, PVCs, hypertension, cryptogenic stroke, diabetes.  He was admitted in April of this year with stroke.  He received thrombolytics.  He has had carotid endarterectomy.  Neurology is continue to recommend loop recorder implant given concern that his carotid artery disease may not be the cause of his stroke history.       Past medical, surgical, social and family history were reviewed.  ROS:   Please see the history of present illness.    All other systems reviewed and are negative.  EKGs/Labs/Other Studies Reviewed:    The following studies were reviewed today:  Mar 22, 2023 ZIO monitor personally reviewed No atrial fibrillation  February 23, 2023 echo EF greater than 55% RV function normal no MR        Physical Exam:    VS:  There were no vitals taken for this visit.    Wt Readings from Last 3 Encounters:  06/10/23 202 lb (91.6 kg)  05/28/23 200 lb (90.7 kg)  05/13/23 200 lb (90.7 kg)     GEN:  Well nourished, well developed in no acute distress CARDIAC: RRR, no murmurs, rubs, gallops RESPIRATORY:  Clear to auscultation without rales, wheezing or rhonchi       ASSESSMENT:    No diagnosis found. PLAN:    In order of problems listed above:  #Cryptogenic Stroke Pathophysiology of cryptogenic stroke was discussed in detail during today's clinic appointment. I discussed the role of loop recorder monitoring in patients who have suffered a CVA/TIA.  There has been no evidence of AF thus far in the patient's evaluation. Loop recorder monitors were discussed in detail including the implant procedure and its risks. I discussed the monthly monitoring costs associated with loop recorder monitoring. The patient would like to proceed with ILR implant.  #Hypertension At goal today.  Recommend checking blood pressures 1-2 times per week at home and recording the values.  Recommend bringing these recordings to the primary care physician. Continue lisinopril.      Signed, Steffanie Dunn, MD, [facility], [facility] 06/15/2023 6:00 AM    Electrophysiology  [facility]  --------------------------  SURGEON:  [name], MD     PREPROCEDURE DIAGNOSIS:  Cryptogenic stroke    POSTPROCEDURE DIAGNOSIS: Cryptogenic stroke     PROCEDURES:   1. Implantable loop recorder implantation    INTRODUCTION:  Abdo Lyell Treichler presents with a history of cryptogenic stroke The costs of loop recorder monitoring have been discussed with the patient.    DESCRIPTION OF PROCEDURE:  Informed written consent was obtained.  A preprocedural timeout was performed with the RN Wenda Low). The patient required no sedation for the procedure today.  Mapping over the patient's chest was performed to identify the area where electrograms were most prominent for ILR recording.  This area was found to be the left parasternal region over the 4th intercostal space. The patients left chest was therefore prepped and draped in  the usual sterile fashion. The skin overlying the left parasternal region was infiltrated with lidocaine for local analgesia.  A 0.5-cm incision was made over the left parasternal region over the 3rd intercostal space.  A subcutaneous ILR pocket was fashioned using a combination of sharp and blunt dissection.  A Medtronic Reveal LINQ [id number]) implantable loop recorder was then placed into the pocket  R waves were very prominent and measured  >0.46mV.  Steri- Strips and a sterile dressing were then applied.  There were no early apparent complications.     CONCLUSIONS:   1. Successful implantation of a implantable loop recorder for Cryptogenic stroke  2. No early apparent complications.   Sheria Lang T. Lalla Brothers, MD, [facility], [facility] Cardiac Electrophysiology

## 2023-06-15 NOTE — Patient Instructions (Addendum)
Medication Instructions:  Your physician recommends that you continue on your current medications as directed. Please refer to the Current Medication list given to you today.  Labwork: None ordered.  Testing/Procedures: None ordered.  Follow-Up: As needed   Implantable Loop Recorder Placement, Care After This sheet gives you information about how to care for yourself after your procedure. Your health care provider may also give you more specific instructions. If you have problems or questions, contact your health care provider. What can I expect after the procedure? After the procedure, it is common to have: Soreness or discomfort near the incision. Some swelling or bruising near the incision.  Follow these instructions at home: Incision care  Monitor your cardiac device site for redness, swelling, and drainage. Call the device clinic at [phone] if you experience these symptoms or fever/chills.  Keep the large square bandage on your site for 24 hours and then you may remove it yourself. Keep the steri-strips underneath in place.   You may shower after 72 hours / 3 days from your procedure with the steri-strips in place. They will usually fall off on their own, or may be removed after 10 days. Pat dry.   Avoid lotions, ointments, or perfumes over your incision until it is well-healed.  Please do not submerge in water until your site is completely healed.   Your device is MRI compatible.   Remote monitoring is used to monitor your cardiac device from home. This monitoring is scheduled every month by our office. It allows us to keep an eye on the function of your device to ensure it is working properly.  If your wound site starts to bleed apply pressure.    For help with the monitor please call Medtronic Monitor Support Specialist directly at [phone].    If you have any questions/concerns please call the device clinic at [phone].  Activity  Return to your  normal activities.  General instructions Follow instructions from your health care provider about how to manage your implantable loop recorder and transmit the information. Learn how to activate a recording if this is necessary for your type of device. You may go through a metal detection gate, and you may let someone hold a metal detector over your chest. Show your ID card if needed. Do not have an MRI unless you check with your health care provider first. Take over-the-counter and prescription medicines only as told by your health care provider. Keep all follow-up visits as told by your health care provider. This is important. Contact a health care provider if: You have redness, swelling, or pain around your incision. You have a fever. You have pain that is not relieved by your pain medicine. You have triggered your device because of fainting (syncope) or because of a heartbeat that feels like it is racing, slow, fluttering, or skipping (palpitations). Get help right away if you have: Chest pain. Difficulty breathing. Summary After the procedure, it is common to have soreness or discomfort near the incision. Change your dressing as told by your health care provider. Follow instructions from your health care provider about how to manage your implantable loop recorder and transmit the information. Keep all follow-up visits as told by your health care provider. This is important. This information is not intended to replace advice given to you by your health care provider. Make sure you discuss any questions you have with your health care provider. Document Released: 09/30/2015 Document Revised: 12/04/2017 Document Reviewed: 12/04/2017 Elsevier Patient Education  2020   [employer]  

## 2023-06-16 ENCOUNTER — Ambulatory Visit: Payer: Medicare Other | Admitting: Diagnostic Neuroimaging

## 2023-06-16 ENCOUNTER — Other Ambulatory Visit: Payer: Medicare Other

## 2023-06-16 ENCOUNTER — Encounter: Payer: Self-pay | Admitting: Diagnostic Neuroimaging

## 2023-06-16 VITALS — BP 117/72 | HR 60 | Ht 72.0 in | Wt 201.0 lb

## 2023-06-16 DIAGNOSIS — R4701 Aphasia: Secondary | ICD-10-CM

## 2023-06-16 DIAGNOSIS — G40909 Epilepsy, unspecified, not intractable, without status epilepticus: Secondary | ICD-10-CM

## 2023-06-16 DIAGNOSIS — G459 Transient cerebral ischemic attack, unspecified: Secondary | ICD-10-CM | POA: Diagnosis not present

## 2023-06-16 MED ORDER — LEVETIRACETAM 500 MG PO TABS: 500.0000 mg | ORAL_TABLET | Freq: Two times a day (BID) | ORAL | 4 refills | Status: DC

## 2023-06-16 NOTE — Patient Instructions (Signed)
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   - Please maintain precautions. Do not participate in activities where a loss of awareness could harm you or someone else. No swimming alone, no tub bathing, no hot tubs, no driving, no operating motorized vehicles (cars, ATVs, motocycles, etc), lawnmowers, power tools or firearms. No standing at heights, such as rooftops, ladders or stairs. Avoid hot objects such as stoves, heaters, open fires. Wear a helmet when riding a bicycle, scooter, skateboard, etc. and avoid areas of traffic. Set your water heater to 120 degrees or less.

## 2023-06-16 NOTE — Progress Notes (Unsigned)
[facility]  PATIENT: Shannon Chung DOB: 1941-10-10  REFERRING CLINICIAN: Bhagat, Karmen Bongo, MD HISTORY FROM: patient REASON FOR VISIT: new consult   HISTORICAL  CHIEF COMPLAINT:  Chief Complaint  Patient presents with   New Patient (Initial Visit)    Pt with wife, rm 7. Here today for evaluation from ? Sroke in April 2024. 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 June had 2 stents and post surgery had expressive aphasia. Brief moments that would come and go. They were questioning stroke and then 7/1 a EEG was completed and showed study is suggestive of cortical dysfunction arising from left temporal region, non specific etiology. No seizures or epileptiform discharges were seen throughout the recording. He has not had any more episodes since starting keppra.   Other    Here today to discuss being able to drive and return to work.    HISTORY OF PRESENT ILLNESS:   UPDATE (06/17/23, VRP): Since last visit, in April 2024 had right arm and right leg weakness, went to the hospital for stroke evaluation and received tenecteplase.  No acute infarct was found on MRI.  In 04/26/23 patient underwent left carotid revascularization, and postoperatively had several episodes of speech difficulty and aphasia that were transient.  Patient was managed medically.  Patient returned to ER on 04/28/2023 for recurrent symptoms.  Returned on 05/02/2023 for recurrent symptoms of aphasia.  This time EEG showed some cortical dysfunction left temporal region and therefore patient was started empirically on levetiracetam 500 mg twice a day.  Since that time patient is doing well.  No further events.  PRIOR HPI (04/15/22): 82 year old male here for evaluation of syncope and abnormal CT head.  01/18/22 patient was in [place] at a motel, feeling under the weather with cold-like symptoms.  He took some allergy medications.  He came out of the hotel room and apparently tripped and fell down.  Unclear whether he  passed out or tripped and was not conscious.  Patient went to hospital for evaluation.  He was diagnosed with COVID and evaluated for syncope.  CT of the head showed atrophy and some chronic infarcts.  Patient has never had unilateral numbness, weakness, slurred speech, vision loss or other stroke symptoms in the past.  Since that time patient is back to baseline.  He did note some intermittent numbness and pulling sensation in his right jaw and neck with certain head and neck positions.   REVIEW OF SYSTEMS: Full 14 system review of systems performed and negative with exception of: as per HPI.  ALLERGIES: Allergies  Allergen Reactions   Pollen Extract Itching    HOME MEDICATIONS: Outpatient Medications Prior to Visit  Medication Sig Dispense Refill   acetaminophen (TYLENOL) 500 MG tablet Take 500 mg by mouth every 6 (six) hours as needed for mild pain.      aspirin 81 MG EC tablet Take 162 mg by mouth daily. Swallow whole.     atorvastatin (LIPITOR) 40 MG tablet Take 1 tablet (40 mg total) by mouth daily.     Blood Pressure KIT Check blood pressure two to three times a week 1 kit 0   Cholecalciferol (VITAMIN D-3) 125 MCG (5000 UT) TABS Take 5,000 Units by mouth daily.     clopidogrel (PLAVIX) 75 MG tablet Take 1 tablet (75 mg total) by mouth daily. 30 tablet 0   ezetimibe (ZETIA) 10 MG tablet Take 1 tablet (10 mg total) by mouth daily. 90 tablet 3   famotidine (PEPCID) 20 MG tablet  TAKE 1 TABLET BY MOUTH 2 TIMES DAILY AS NEEDED FOR HEARTBURN/INDIGESTION. D/C ZANTAC 180 tablet 3   fluticasone (FLONASE) 50 MCG/ACT nasal spray Place 1 spray into both nostrils daily as needed for allergies.     lisinopril (ZESTRIL) 10 MG tablet TAKE 1 TABLET BY MOUTH  DAILY 90 tablet 3   metFORMIN (GLUCOPHAGE) 500 MG tablet Take 1 tablet (500 mg total) by mouth 2 (two) times daily with a meal. 180 tablet 3   metoprolol succinate (TOPROL-XL) 25 MG 24 hr tablet TAKE 1 TABLET BY MOUTH ONCE  DAILY 90 tablet 2    prednisoLONE acetate (PRED FORTE) 1 % ophthalmic suspension Place 1 drop into both eyes daily.     sildenafil (REVATIO) 20 MG tablet TAKE 1 TABLET BY MOUTH DAILY AS NEEDED 30 tablet 0   vitamin B-12 (CYANOCOBALAMIN) 500 MCG tablet Take 500 mcg by mouth daily.     levETIRAcetam (KEPPRA) 500 MG tablet Take 1 tablet (500 mg total) by mouth 2 (two) times daily. 60 tablet 0   No facility-administered medications prior to visit.    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 06/2015 Cardiac CT: Ca score 1103 (84th %'ile);  b. 07/2015 Cath: LM 70, LAD 80p, 100/84m, D1 70, D2 95, RI 75, RCA 100p/m;  c. 07/2015 CABG x 5 (LIMA->LAD, VG->Diag, VG->OM1->OM2, VG->OM3).   COVID-19    12/2021   COVID-19 01/18/2022   COVID-19 vaccine administered 01/18/2022   Unknown how many vaccine doses have been received. Entered from Emergency Triage Note.   Diabetes mellitus without complication (HCC) 07/2015   Dyslipidemia    Essential hypertension    Essential hypertension 01/02/2015   Formatting of this note might be different from the original.  Last Assessment & Plan:   Chronic, stable. Continue current regimen.   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 dilated, EF 35% improved to 45-50% (2015);  b. 07/2015 EF 25-35% by LV gram.   Ischemic cardiomyopathy 01/02/2015   Kidney stones 04/17/2021   Lone atrial fibrillation (HCC) 1983   a. isolated episode, not on OAC.   Malignant neoplasm of prostate (HCC) 10/07/2021  09/2021    Medicare annual wellness visit, subsequent 10/21/2015   Mural thrombus of cardiac apex    a. 06/2014: LV; resolved with coumadin-->no residual on f/u echo, no longer on coumadin.   Mural thrombus of heart 08/26/2021   Formatting of this note might be different from the original. Jun 16, 2019 Entered By: [name] Comment: left ventricle, cardiac apex   Osteoarthritis    a. 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 Jun 16, 2019 Entered By: [name]  Comment: leftAug 19, 2020 Entered By: [name] Comment: resolved by total left knee replacement Jun 16, 2019 Entered By: [name] Comment: leftAug 19, 2020 Entered By: [name] Comment: resolved by total left knee replacement   Trigger finger of left hand 07/07/2019   Vitamin D deficiency     PAST SURGICAL HISTORY: Past Surgical History:  Procedure Laterality Date   BICEPS TENDON REPAIR Right 1993   CARDIAC CATHETERIZATION N/A 07/05/2015   Procedure: Left Heart Cath and Coronary Angiography;  Surgeon: Antonieta Iba, MD;  Location: [facility] INVASIVE CV LAB;  Service: Cardiovascular;  Laterality: N/A;   CAROTID PTA/STENT INTERVENTION Left 04/26/2023   Procedure: CAROTID PTA/STENT INTERVENTION;  Surgeon: Annice Needy, MD;  Location: [facility] INVASIVE CV LAB;  Service: Cardiovascular;  Laterality: Left;   CATARACT EXTRACTION Left 12/2016   with keratoplasty   COLONOSCOPY  2007   COLONOSCOPY WITH PROPOFOL N/A 12/02/2017   TA, SSA, rpt 3 yrs(Tahiliani, Varnita B, MD)   COLONOSCOPY WITH PROPOFOL N/A 11/26/2020   Procedure: COLONOSCOPY WITH PROPOFOL;  Surgeon: Midge Minium, MD;  Location: [facility] ENDOSCOPY;  Service: Endoscopy;  Laterality: N/A;   CORONARY ARTERY BYPASS GRAFT N/A 07/29/2015   Procedure: CORONARY ARTERY BYPASS GRAFTING (CABG) x 5 (LIMA to LAD, SVG to DIAGONAL,  SVG SEQUENTIALLY to OM1 and OM2, SVG to OM3) with Endoscopic Vein Havesting of  GREATER SAPHENOUS VEIN from RIGHT THIGH and partial LOWER LEG ;  Surgeon: Alleen Borne, MD;  Location: [facility] OR;  Service: Open Heart Surgery;  Laterality: N/A;   EYE SURGERY     b/l cataract and cornea replaced    HAND SURGERY     left hand 1st/2nd trigger fingers Dr. Hyacinth Meeker ortho    JOINT REPLACEMENT     KNEE ARTHROSCOPY Left remote   MOHS SURGERY     left cheek scc 2022 Dr. Jeannine Boga   MOHS SURGERY     x 5 facial scc   right biceps tendon     repair/re attachment    SKIN CANCER EXCISION  10/2015   BCC - L ala (pending  MOHs) and L scapula (complete excision)   TEE WITHOUT CARDIOVERSION N/A 07/29/2015   Procedure:  TRANSESOPHAGEAL ECHOCARDIOGRAM (TEE);  Surgeon: Alleen Borne, MD;  Location: [facility] OR;  Service: Open Heart Surgery;  Laterality: N/A;   TONSILLECTOMY  1949   TOTAL KNEE ARTHROPLASTY Left 03/18/2016   cemented L TKR; Deeann Saint, MD    FAMILY HISTORY: Family History  Problem Relation Age of Onset   CAD Father 40       MI   Hypertension Father    Hyperlipidemia Father    Alcoholism Father    Diabetes Father    Alcohol abuse Father    Heart disease Father    Cancer Daughter        dx'ed 72 retroperitoneal liposarcoma     SOCIAL HISTORY: Social History   Socioeconomic History   Marital status: Divorced    Spouse name: [place]   Number of children: 2   Years of education: Not on file   Highest education level: Some college, no degree  Occupational History    Comment: retired  Tobacco Use   Smoking status: Former    Current packs/day: 0.00    Average packs/day: 0.5 packs/day for 10.0 years (5.0 ttl pk-yrs)    Types: Cigarettes    Start date: 11/02/1968    Quit date: 11/02/1978    Years since quitting: 44.6   Smokeless tobacco: Never   Tobacco comments:    former smoker 1967-1980 1 pk/week no FH lung cancer   Vaping Use   Vaping status: Never Used  Substance and Sexual Activity   Alcohol use: Not Currently    Alcohol/week: 4.0 standard drinks of alcohol    Types: 4 Cans of beer per week    Comment: beer/wine on weekends   Drug use: No   Sexual activity: Not Currently    Birth control/protection: Abstinence  Other Topics Concern   Not on file  Social History Narrative   Lives with wife for 68yrs Greta Doom)   Moved from [place] years ago in 2015 to this area    Divorced, one daughter deceased   Retired Electronics engineer    Occupation Retired Clinical research associate   Edu: 1 yr college   Activity: volunteers at [employer]   Diet: good water, fruits/vegetables daily      Tested at risk for OSA in  preop for CABG   Social Determinants of Health   Financial Resource Strain: Low Risk  (05/28/2023)   Overall Financial Resource Strain (CARDIA)    Difficulty of Paying Living Expenses: Not hard at all  Food Insecurity: No Food Insecurity (05/28/2023)   Hunger Vital Sign    Worried About Running Out of Food in the Last Year: Never true    Ran Out of Food in the Last Year: Never true  Transportation Needs: No Transportation Needs (05/28/2023)   PRAPARE - Administrator, Civil Service (Medical): No    Lack of Transportation (Non-Medical): No  Physical Activity: Sufficiently Active (05/28/2023)   Exercise Vital Sign    Days of Exercise per Week: 4 days    Minutes of Exercise per Session: 40 min  Recent Concern: Physical Activity - Insufficiently Active (03/01/2023)   Exercise Vital Sign    Days of Exercise per Week: 4 days    Minutes of Exercise per Session: 20 min  Stress: No Stress Concern Present (05/28/2023)   [employer] of Occupational Health - Occupational Stress Questionnaire    Feeling of Stress : Not at all  Social Connections: Socially Integrated (05/28/2023)   Social Connection and Isolation Panel [NHANES]  Frequency of Communication with Friends and Family: More than three times a week    Frequency of Social Gatherings with Friends and Family: More than three times a week    Attends Religious Services: More than 4 times per year    Active Member of [employer] or Organizations: Yes    Attends Engineer, structural: More than 4 times per year    Marital Status: Married  Catering manager Violence: Not At Risk (05/28/2023)   Humiliation, Afraid, Rape, and Kick questionnaire    Fear of Current or Ex-Partner: No    Emotionally Abused: No    Physically Abused: No    Sexually Abused: No     PHYSICAL EXAM  GENERAL EXAM/CONSTITUTIONAL: Vitals:  Vitals:   06/16/23 0824  BP: 117/72  Pulse: 60  Weight: 201 lb (91.2 kg)  Height: 6' (1.829 m)   Body mass  index is 27.26 kg/m. Wt Readings from Last 3 Encounters:  06/16/23 201 lb (91.2 kg)  06/15/23 201 lb (91.2 kg)  06/10/23 202 lb (91.6 kg)   Patient is in no distress; well developed, nourished and groomed SLIGHTLY DECR ROM IN HEAD / NECK WITH RIGHTWARD ROTATION AND EXTENSION  CARDIOVASCULAR: Examination of carotid arteries is normal; no carotid bruits Regular rate and rhythm, no murmurs Examination of peripheral vascular system by observation and palpation is normal  EYES: Ophthalmoscopic exam of optic discs and posterior segments is normal; no papilledema or hemorrhages No results found.  MUSCULOSKELETAL: Gait, strength, tone, movements noted in Neurologic exam below  NEUROLOGIC: MENTAL STATUS:     02/18/2017    2:47 PM  MMSE - Mini Mental State Exam  Orientation to time 5  Orientation to Place 5  Registration 3  Attention/ Calculation 0  Recall 3  Language- name 2 objects 0  Language- repeat 1  Language- follow 3 step command 3  Language- read & follow direction 0  Write a sentence 0  Copy design 0  Total score 20   awake, alert, oriented to person, place and time recent and remote memory intact normal attention and concentration language fluent, comprehension intact, naming intact fund of knowledge appropriate  CRANIAL NERVE:  2nd - no papilledema on fundoscopic exam 2nd, 3rd, 4th, 6th - pupils equal and reactive to light, visual fields full to confrontation, extraocular muscles intact, no nystagmus 5th - facial sensation symmetric 7th - facial strength symmetric 8th - hearing intact 9th - palate elevates symmetrically, uvula midline 11th - shoulder shrug symmetric 12th - tongue protrusion midline  MOTOR:  normal bulk and tone, full strength in the BUE, BLE  SENSORY:  normal and symmetric to light touch, temperature, vibration  COORDINATION:  finger-nose-finger, fine finger movements normal  REFLEXES:  deep tendon reflexes TRACE and  symmetric  GAIT/STATION:  narrow based gait; LIMPING ON RIGHT KNEE PAIN     DIAGNOSTIC DATA (LABS, IMAGING, TESTING) - I reviewed patient records, labs, notes, testing and imaging myself where available.  Lab Results  Component Value Date   WBC 6.3 05/05/2023   HGB 13.3 05/05/2023   HCT 40.8 05/05/2023   MCV 98.2 05/05/2023   PLT 195.0 05/05/2023      Component Value Date/Time   NA 139 05/05/2023 1140   NA 141 11/28/2014 0000   NA 141 11/28/2014 0000   K 4.1 05/05/2023 1140   CL 102 05/05/2023 1140   CO2 28 05/05/2023 1140   GLUCOSE 211 (H) 05/05/2023 1140   BUN 23 05/05/2023 1140   BUN  12 11/28/2014 0000   CREATININE 0.95 05/05/2023 1140   CREATININE 1.06 09/02/2018 0948   CALCIUM 9.9 05/05/2023 1140   PROT 7.4 05/02/2023 2014   PROT 7.4 01/23/2016 0803   ALBUMIN 4.3 05/02/2023 2014   ALBUMIN 4.8 01/23/2016 0803   ALBUMIN 4.0 11/28/2014 0000   AST 23 05/02/2023 2014   AST 21 11/28/2014 0000   AST 21 11/28/2014 0000   ALT 15 05/02/2023 2014   ALT 27 11/28/2014 0000   ALT 27 11/28/2014 0000   ALKPHOS 51 05/02/2023 2014   ALKPHOS 53 11/28/2014 0000   ALKPHOS 53 11/28/2014 0000   BILITOT 0.6 05/02/2023 2014   BILITOT 0.6 01/23/2016 0803   BILITOT 0.5 11/28/2014 0000   BILITOT 0.5 11/28/2014 0000   GFRNONAA >60 05/02/2023 2014   GFRAA >60 11/29/2017 1039   Lab Results  Component Value Date   CHOL 115 05/03/2023   HDL 42 05/03/2023   LDLCALC 51 05/03/2023   TRIG 111 05/03/2023   CHOLHDL 2.7 05/03/2023   Lab Results  Component Value Date   HGBA1C 6.4 (H) 02/22/2023   Lab Results  Component Value Date   VITAMINB12 307 10/01/2020   Lab Results  Component Value Date   TSH 1.54 12/04/2021    01/18/22 CT head [I reviewed images myself. Minimal chronic small vessel ischemic disease. -VRP]  - atrophy and chronic infarcts - acute right parietal scalp hematoma  03/19/22 Carotid u/s 1. Right-greater-than-left atherosclerotic plaque of the distal common  carotid arteries and carotid bulbs results in less than 50% stenosis by Doppler criteria bilaterally. 2. Bilateral vertebral arteries demonstrate normal antegrade flow.  03/05/22 xray cervical Multilevel degenerative disc and facet disease.  05/03/23 EEG This study is suggestive of cortical dysfunction arising from left temporal region, non specific etiology. No seizures or epileptiform discharges were seen throughout the recording.    ASSESSMENT AND PLAN  82 y.o. year old male here with:   Dx:  1. Seizure disorder (HCC)   2. Aphasia   3. 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   - Please maintain precautions. Do not participate in activities where a loss of awareness could harm you or someone else. No swimming alone, no tub bathing, no hot tubs, no driving, no operating motorized vehicles (cars, ATVs, motocycles, etc), lawnmowers, power tools or firearms. No standing at heights, such as rooftops, ladders or stairs. Avoid hot objects such as stoves, heaters, open fires. Wear a helmet when riding a bicycle, scooter, skateboard, etc. and avoid areas of traffic. Set your water heater to 120 degrees or less.    MINIMAL CEREBRAL SMALL VESSEL DISEASE  - continue medical management (BP, statin, DM control) - no evidence of prior stroke clinically   SYNCOPE (likely related to COVID infx) - continue supportive care; follow up with PCP and cardiology  Return in about 6 months (around 12/17/2023).    Suanne Marker, MD  06/16/2023, 9:07 AM Certified in Neurology, Neurophysiology and Neuroimaging  [facility] [address] [place], [place] [address] [phone]

## 2023-06-17 ENCOUNTER — Encounter: Payer: Self-pay | Admitting: Diagnostic Neuroimaging

## 2023-06-17 ENCOUNTER — Ambulatory Visit: Payer: Medicare Other | Admitting: Physical Therapy

## 2023-06-17 DIAGNOSIS — M6281 Muscle weakness (generalized): Secondary | ICD-10-CM

## 2023-06-17 DIAGNOSIS — R269 Unspecified abnormalities of gait and mobility: Secondary | ICD-10-CM

## 2023-06-17 NOTE — Therapy (Signed)
OUTPATIENT PHYSICAL THERAPY TREATMENT    Patient Name: Shannon Chung MRN: [id number] DOB:[birth year], 82 y.o., male Today's Date: 06/17/2023   PCP: Dana Allan, MD  REFERRING PROVIDER:   Dana Allan, MD    END OF SESSION:  PT End of Session - 06/17/23 0846     Visit Number 18    Number of Visits 33    Date for PT Re-Evaluation 08/05/23    Authorization Type [employer] Medicare    Authorization Time Period 05/13/23-08/05/23    Progress Note Due on Visit 20    PT Start Time 0846    PT Stop Time 0928    PT Time Calculation (min) 42 min    Equipment Utilized During Treatment Gait belt    Activity Tolerance Patient tolerated treatment well;No increased pain;Patient limited by fatigue    Behavior During Therapy [facility] for tasks assessed/performed                   Past Medical History:  Diagnosis Date   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 06/2015 Cardiac CT: Ca score 1103 (84th %'ile);  b. 07/2015 Cath: LM 70, LAD 80p, 100/69m, D1 70, D2 95, RI 75, RCA 100p/m;  c. 07/2015 CABG x 5 (LIMA->LAD, VG->Diag, VG->OM1->OM2, VG->OM3).   COVID-19    12/2021   COVID-19 01/18/2022   COVID-19 vaccine administered 01/18/2022   Unknown how many vaccine doses have been received. Entered from Emergency Triage Note.   Diabetes mellitus without complication (HCC) 07/2015   Dyslipidemia    Essential hypertension    Essential hypertension 01/02/2015   Formatting of this note might be different from the original.  Last Assessment & Plan:   Chronic, stable. Continue current  regimen.   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 dilated, EF 35% improved to 45-50% (2015);  b. 07/2015 EF 25-35% by LV gram.   Ischemic cardiomyopathy 01/02/2015   Kidney stones 04/17/2021   Lone atrial fibrillation (HCC) 1983   a. isolated episode, not on OAC.   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 06/2014: LV;  resolved with coumadin-->no residual on f/u echo, no longer on coumadin.   Mural thrombus of heart 08/26/2021   Formatting of this note might be different from the original. Jun 16, 2019 Entered By: [name] Comment: left ventricle, cardiac apex   Osteoarthritis    a. 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 Jun 16, 2019 Entered By: [name] Comment: leftAug 19, 2020 Entered By: [name] Comment: resolved by total left knee replacement Jun 16, 2019 Entered By: [name] Comment: leftAug 19, 2020 Entered By: [name] Comment: resolved by total left knee replacement   Trigger finger of left hand 07/07/2019   Vitamin D deficiency    Past Surgical History:  Procedure Laterality Date   BICEPS TENDON REPAIR Right 1993   CARDIAC CATHETERIZATION N/A 07/05/2015   Procedure: Left Heart Cath and Coronary Angiography;  Surgeon: Antonieta Iba, MD;  Location: [facility] INVASIVE CV LAB;  Service: Cardiovascular;  Laterality: N/A;   CAROTID PTA/STENT INTERVENTION Left 04/26/2023   Procedure: CAROTID PTA/STENT INTERVENTION;  Surgeon: Annice Needy, MD;  Location: [facility] INVASIVE CV LAB;  Service: Cardiovascular;  Laterality: Left;   CATARACT EXTRACTION Left 12/2016   with keratoplasty   COLONOSCOPY  2007   COLONOSCOPY WITH PROPOFOL N/A 12/02/2017   TA, SSA, rpt 3 yrs(Tahiliani, Varnita B, MD)   COLONOSCOPY WITH PROPOFOL N/A 11/26/2020   Procedure: COLONOSCOPY WITH PROPOFOL;  Surgeon: Midge Minium, MD;  Location: [facility] ENDOSCOPY;  Service: Endoscopy;  Laterality: N/A;   CORONARY ARTERY BYPASS GRAFT N/A 07/29/2015   Procedure: CORONARY ARTERY BYPASS GRAFTING (CABG) x 5 (LIMA to LAD, SVG to DIAGONAL,  SVG SEQUENTIALLY to OM1 and OM2, SVG to OM3) with Endoscopic Vein Havesting of  GREATER SAPHENOUS VEIN from RIGHT THIGH and partial LOWER LEG ;  Surgeon: Alleen Borne, MD;  Location: [facility] OR;  Service: Open Heart Surgery;  Laterality: N/A;   EYE SURGERY     b/l cataract and cornea  replaced    HAND SURGERY     left hand 1st/2nd trigger fingers Dr. [facility] orth[facility]    JOINT REPLACEMENT     KNEE ARTHROSCOPY Left remote   MOHS SURGERY     left cheek scc 2022 Dr. Jeannine Boga   MOHS SURGERY     x 5 facial scc   right biceps tendon     repair/re attachment    SKIN CANCER EXCISION  10/2015   BCC - L ala (pending MOHs) and L scapula (complete excision)   TEE WITHOUT CARDIOVERSION N/A 07/29/2015   Procedure: TRANSESOPHAGEAL ECHOCARDIOGRAM (TEE);  Surgeon: Alleen Borne, MD;  Location: [facility] OR;  Service: Open Heart Surgery;  Laterality: N/A;   TONSILLECTOMY  1949  TOTAL KNEE ARTHROPLASTY Left 03/18/2016   cemented L TKR; Deeann Saint, MD   Patient Active Problem List   Diagnosis Date Noted   TIA (transient ischemic attack) 05/02/2023   GERD without esophagitis 05/02/2023   Carotid stenosis, symptomatic, with infarction (HCC) 04/26/2023   History of CVA (cerebrovascular accident) without residual deficits 03/04/2023   Acute CVA (cerebrovascular accident) (HCC) 02/22/2023   Chronic radicular pain of lower back 08/10/2022   Cervical spondylosis 03/11/2022   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   Bilateral carotid artery stenosis 03/11/2022   Lumbar spondylosis 10/07/2021   DDD (degenerative disc disease), lumbar 10/07/2021   History of radiation therapy 08/26/2021   Aortic atherosclerosis (HCC) 04/17/2021   Diverticulosis 04/17/2021   Hypertension associated with diabetes (HCC) 10/07/2020   Overweight (BMI 25.0-29.9) 10/07/2020   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   Vitamin D deficiency 08/01/2020   Polyp of sigmoid colon 08/01/2020   Insomnia 06/07/2020   Gastroesophageal reflux disease 04/04/2020   Degenerative joint disease of hand 03/01/2020   BPH (benign prostatic hyperplasia) 08/05/2018   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   Macular scar of both eyes 04/20/2018   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   Scotoma involving central area of both eyes 04/20/2018    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   Fatty liver 03/25/2018   Erectile dysfunction 02/23/2017   Hx of CABG 01/24/2017   Advanced care planning/counseling discussion 10/21/2015   Coronary artery disease of native artery of native heart with stable angina pectoris (HCC)    DM2 (diabetes mellitus, type 2) (HCC) 08/12/2015   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   Dyslipidemia 01/02/2015   Essential hypertension 01/02/2015   Osteoarthritis 01/02/2015   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/02/2014    ONSET DATE: 02/22/23  REFERRING DIAG: Z86.73 (ICD-10-CM) - History of CVA (cerebrovascular accident)   THERAPY DIAG:  Abnormality of gait and mobility  Muscle weakness (generalized)  Rationale for Evaluation and Treatment: Rehabilitation  SUBJECTIVE:                                                                                                                                                                                             SUBJECTIVE STATEMENT:  Pt he went to the MD and he is still under restrictions from work and driving. The MD is reluctant to release Mar[place]ell because he is still concerned regarding his condition. His loop recorder doctor stated he should take a light day and focus on LE movements and exercise in PT today.    Pt accompanied by: self  PERTINENT HISTORY:  Continued to progress high level balance this date, advancing volume and reducing recovery intervals  between activities. Also incorporated work duty Counselling psychologist. Pt tolerates session well in general, shows general improvement with balance activities within session. Pt continues to demonstrate excellent progress toward goals of care.   PMH: CAD s/p CABG, carotid stenosis, HTN, HLD, T2DM, CVA, Left TKA, chronic lumbar spine DJD and back pain, cervical spondylosis, BPH, insomnia.   PAIN:  Are you having pain? Yes, right knee at baseline level soreness   PRECAUTIONS: None  WEIGHT BEARING RESTRICTIONS:  No  FALLS: Has patient fallen in last 6 months? No  PATIENT GOALS: To ensure he is physically at the level he was prior to the stroke in April.   OBJECTIVE:    TODAY'S TREATMENT:     06/17/23      TE                                                                                                          -Nustep intervals LE only  -L1 x 1 min   -L3 x 1 min   -3 rounds of the above  Heel raises with UE for balance   - 2 x 15 reps  Reverse lunges x 10 reps ea LE   Seated LAQ with 4# AW 2 x 10 ea LE   Standing march with 4# AW 2 x 10 ea LE   Resisted gait with 12.5# resistance on cable machine x 4 laps forward and retro walking  PATIENT EDUCATION: Education details: POC Person educated: Patient Education method: Explanation Education comprehension: verbalized understanding  HOME EXERCISE PROGRAM:  Access Code: [facility] URL: [email or web address] Date: 05/04/2023 Prepared by: Maureen Ralphs  Exercises - Seated Hip Flexion March with Ankle Weights  - 3 x weekly - 3 sets - 10 reps - Seated Long Arc Quad  - 3 x weekly - 3 sets - 10 reps - Seated Heel Raise  - 3 x weekly - 3 sets - 10 reps - Seated Toe Raise  - 3 x weekly - 3 sets - 10 reps - Seated Hip Abduction with Resistance  - 1 x daily - 3 x weekly - 3 sets - 10 reps    Access Code: [email or web address] URL: [email or web address] Date: 03/17/2023 Prepared by: Thresa Ross  Exercises - Standing Single Leg Stance with Counter Support  - 1 x daily - 7 x weekly - 2 sets - 30 second  hold - Tandem Stance  - 1 x daily - 7 x weekly - 2 sets - 30 second  hold - Forward Step Touch  - 1 x daily - 7 x weekly - 2 sets - 10 reps - Standing March with Counter Support  - 1 x daily - 7 x weekly - 2 sets - 10 reps  GOALS: Goals reviewed with patient? Yes  SHORT TERM GOALS: Target date: 04/07/2023    Patient will be independent in home exercise program to improve strength/mobility for better  functional independence with ADLs. Baseline: No HEP currently: 05/13/2023= patient reports feeling good about recent HEP and able to complete all HEP to date Goal status: MET  LONG TERM GOALS: Target date: 08/05/2023  1.  Patient will increase FOTO score to equal to or greater than  77   to demonstrate statistically significant improvement in mobility and quality of life.  Baseline: 67; 05/13/2023= 61 Goal status: Ongoing   2.  Patient will increase Berg Balance score by > 6 points to demonstrate decreased fall risk during functional activities. Baseline: 43; 05/13/2023=44 Goal status: Progressing   3.  Patient will improve DGI by 4 points to reduce fall risk and demonstrate improved transfer/gait ability. Baseline: 17; 05/13/2023= 17 Goal status: Ongoing 4.   Patient will increase six minute walk test distance to >1000 for progression to community ambulator and improve gait ability Baseline: visit 2 test 03/17/23: 850 ft; 05/13/2023= 830 feet with wide BOS Goal status: Ongoing    ASSESSMENT:  CLINICAL IMPRESSION:   Patient presents with good overall motivation for today's session. Pt had a lighter day with LE focus since his MD instruction was to not perform any heavy UE use in this session. Pt continues to progress with LE strengthening interventions this date and exercises to improve his endurance with ambulation. Pt will continue to benefit from skilled physical therapy intervention to address impairments, improve QOL, and attain therapy goals.     OBJECTIVE IMPAIRMENTS: Abnormal gait, decreased activity tolerance, decreased balance, and difficulty walking.   ACTIVITY LIMITATIONS: lifting and locomotion level  PARTICIPATION LIMITATIONS: community activity, occupation, and yard work  PERSONAL FACTORS: Age are also affecting patient's functional outcome.   REHAB POTENTIAL: Excellent  CLINICAL DECISION MAKING: Stable/uncomplicated  EVALUATION COMPLEXITY: Low  PLAN:  PT  FREQUENCY: 1-2x/week  PT DURATION: 8 weeks  PLANNED INTERVENTIONS: Therapeutic exercises, Therapeutic activity, Neuromuscular re-education, Balance training, Gait training, Patient/Family education, Self Care, Joint mobilization, Stair training, Dry Needling, Manual therapy, and Re-evaluation  PLAN FOR NEXT SESSION:  Continue to progress LE strength and dynamic balance as appropriate.    8:46 AM, 06/17/23   Norman Herrlich PT   Physical Therapist- [place]  [facility]

## 2023-06-21 ENCOUNTER — Other Ambulatory Visit (INDEPENDENT_AMBULATORY_CARE_PROVIDER_SITE_OTHER): Payer: Medicare Other

## 2023-06-21 DIAGNOSIS — I152 Hypertension secondary to endocrine disorders: Secondary | ICD-10-CM

## 2023-06-21 DIAGNOSIS — E1159 Type 2 diabetes mellitus with other circulatory complications: Secondary | ICD-10-CM | POA: Diagnosis not present

## 2023-06-21 DIAGNOSIS — E782 Mixed hyperlipidemia: Secondary | ICD-10-CM

## 2023-06-21 DIAGNOSIS — E1169 Type 2 diabetes mellitus with other specified complication: Secondary | ICD-10-CM | POA: Diagnosis not present

## 2023-06-21 LAB — COMPREHENSIVE METABOLIC PANEL
ALT: 15 U/L (ref 0–53)
AST: 19 U/L (ref 0–37)
Albumin: 4.6 g/dL (ref 3.5–5.2)
Alkaline Phosphatase: 59 U/L (ref 39–117)
BUN: 20 mg/dL (ref 6–23)
CO2: 27 mEq/L (ref 19–32)
Calcium: 9.8 mg/dL (ref 8.4–10.5)
Chloride: 100 mEq/L (ref 96–112)
Creatinine, Ser: 0.9 mg/dL (ref 0.40–1.50)
GFR: 81.03 mL/min (ref >60.00)
Glucose, Bld: 106 mg/dL — ABNORMAL HIGH (ref 70–99)
Potassium: 4.4 mEq/L (ref 3.5–5.1)
Sodium: 138 mEq/L (ref 135–145)
Total Bilirubin: 0.8 mg/dL (ref 0.2–1.2)
Total Protein: 7.5 g/dL (ref 6.0–8.3)

## 2023-06-21 LAB — CBC WITH DIFFERENTIAL/PLATELET
Basophils Absolute: 0 10*3/uL (ref 0.0–0.1)
Basophils Relative: 0.8 % (ref 0.0–3.0)
Eosinophils Absolute: 0.1 10*3/uL (ref 0.0–0.7)
Eosinophils Relative: 2.2 % (ref 0.0–5.0)
HCT: 44.3 % (ref 39.0–52.0)
Hemoglobin: 14.2 g/dL (ref 13.0–17.0)
Lymphocytes Relative: 36 % (ref 12.0–46.0)
Lymphs Abs: 2.4 10*3/uL (ref 0.7–4.0)
MCHC: 32.1 g/dL (ref 30.0–36.0)
MCV: 97.8 fl (ref 78.0–100.0)
Monocytes Absolute: 0.5 10*3/uL (ref 0.1–1.0)
Monocytes Relative: 8 % (ref 3.0–12.0)
Neutro Abs: 3.5 10*3/uL (ref 1.4–7.7)
Neutrophils Relative %: 53 % (ref 43.0–77.0)
Platelets: 174 10*3/uL (ref 150.0–400.0)
RBC: 4.53 Mil/uL (ref 4.22–5.81)
RDW: 13.7 % (ref 11.5–15.5)
WBC: 6.5 10*3/uL (ref 4.0–10.5)

## 2023-06-21 LAB — VITAMIN D 25 HYDROXY (VIT D DEFICIENCY, FRACTURES): VITD: 43.78 ng/mL (ref 30.00–100.00)

## 2023-06-21 LAB — LIPID PANEL
Cholesterol: 133 mg/dL (ref 0–200)
HDL: 47.5 mg/dL (ref >39.00)
LDL Cholesterol: 59 mg/dL (ref 0–99)
NonHDL: 85.97
Total CHOL/HDL Ratio: 3
Triglycerides: 133 mg/dL (ref 0.0–149.0)
VLDL: 26.6 mg/dL (ref 0.0–40.0)

## 2023-06-21 LAB — TSH: TSH: 1.32 u[IU]/mL (ref 0.35–5.50)

## 2023-06-21 LAB — VITAMIN B12: Vitamin B-12: 366 pg/mL (ref 211–911)

## 2023-06-21 LAB — HEMOGLOBIN A1C: Hgb A1c MFr Bld: 6.3 % (ref 4.6–6.5)

## 2023-06-22 ENCOUNTER — Encounter: Payer: Self-pay | Admitting: Physical Therapy

## 2023-06-22 ENCOUNTER — Ambulatory Visit: Payer: Medicare Other | Admitting: Physical Therapy

## 2023-06-22 DIAGNOSIS — R269 Unspecified abnormalities of gait and mobility: Secondary | ICD-10-CM | POA: Diagnosis not present

## 2023-06-22 DIAGNOSIS — M6281 Muscle weakness (generalized): Secondary | ICD-10-CM

## 2023-06-22 NOTE — Therapy (Signed)
OUTPATIENT PHYSICAL THERAPY TREATMENT    Patient Name: Shannon Chung MRN: [id number] DOB:Dec 31, 1940, 82 y.o., male Today's Date: 06/22/2023   PCP: Dana Allan, MD  REFERRING PROVIDER:   Dana Allan, MD    END OF SESSION:  PT End of Session - 06/22/23 0843     Visit Number 19    Number of Visits 33    Date for PT Re-Evaluation 08/05/23    Authorization Type [employer] Medicare    Authorization Time Period 05/13/23-08/05/23    Progress Note Due on Visit 20    PT Start Time 0845    PT Stop Time 0927    PT Time Calculation (min) 42 min    Equipment Utilized During Treatment Gait belt    Activity Tolerance Patient tolerated treatment well;No increased pain;Patient limited by fatigue    Behavior During Therapy [facility] for tasks assessed/performed                    Past Medical History:  Diagnosis Date   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 06/2015 Cardiac CT: Ca score 1103 (84th %'ile);  b. 07/2015 Cath: LM 70, LAD 80p, 100/74m, D1 70, D2 95, RI 75, RCA 100p/m;  c. 07/2015 CABG x 5 (LIMA->LAD, VG->Diag, VG->OM1->OM2, VG->OM3).   COVID-19    12/2021   COVID-19 01/18/2022   COVID-19 vaccine administered 01/18/2022   Unknown how many vaccine doses have been received. Entered from Emergency Triage Note.   Diabetes mellitus without complication (HCC) 07/2015   Dyslipidemia    Essential hypertension    Essential hypertension 01/02/2015   Formatting of this note might be different from the original.  Last Assessment & Plan:   Chronic, stable. Continue current  regimen.   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 dilated, EF 35% improved to 45-50% (2015);  b. 07/2015 EF 25-35% by LV gram.   Ischemic cardiomyopathy 01/02/2015   Kidney stones 04/17/2021   Lone atrial fibrillation (HCC) 1983   a. isolated episode, not on OAC.   Malignant neoplasm of prostate [facility]) 10/07/2021   09/2021    Medicare annual wellness visit, subsequent 10/21/2015   Mural thrombus of cardiac apex    a. 06/2014:  LV; resolved with coumadin-->no residual on f/u echo, no longer on coumadin.   Mural thrombus of heart 08/26/2021   Formatting of this note might be different from the original. Jun 16, 2019 Entered By: [name] Comment: left ventricle, cardiac apex   Osteoarthritis    a. 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 Jun 16, 2019 Entered By: [name] Comment: leftAug 19, 2020 Entered By: [name] Comment: resolved by total left knee replacement Jun 16, 2019 Entered By: [name] Comment: leftAug 19, 2020 Entered By: [name] Comment: resolved by total left knee replacement   Trigger finger of left hand 07/07/2019   Vitamin D deficiency    Past Surgical History:  Procedure Laterality Date   BICEPS TENDON REPAIR Right 1993   CARDIAC CATHETERIZATION N/A 07/05/2015   Procedure: Left Heart Cath and Coronary Angiography;  Surgeon: Antonieta Iba, MD;  Location: [facility] INVASIVE CV LAB;  Service: Cardiovascular;  Laterality: N/A;   CAROTID PTA/STENT INTERVENTION Left 04/26/2023   Procedure: CAROTID PTA/STENT INTERVENTION;  Surgeon: Annice Needy, MD;  Location: [facility] INVASIVE CV LAB;  Service: Cardiovascular;  Laterality: Left;   CATARACT EXTRACTION Left 12/2016   with keratoplasty   COLONOSCOPY  2007   COLONOSCOPY WITH PROPOFOL N/A 12/02/2017   TA, SSA, rpt 3 yrs(Tahiliani, Varnita B, MD)   COLONOSCOPY WITH PROPOFOL N/A 11/26/2020   Procedure: COLONOSCOPY WITH PROPOFOL;  Surgeon: Midge Minium, MD;  Location: [facility] ENDOSCOPY;  Service: Endoscopy;  Laterality: N/A;   CORONARY ARTERY BYPASS GRAFT N/A 07/29/2015   Procedure: CORONARY ARTERY BYPASS GRAFTING (CABG) x 5 (LIMA to LAD, SVG to DIAGONAL,  SVG SEQUENTIALLY to OM1 and OM2, SVG to OM3) with Endoscopic Vein Havesting of  GREATER SAPHENOUS VEIN from RIGHT THIGH and partial LOWER LEG ;  Surgeon: Alleen Borne, MD;  Location: [facility] OR;  Service: Open Heart Surgery;  Laterality: N/A;   EYE SURGERY     b/l cataract and cornea  replaced    HAND SURGERY     left hand 1st/2nd trigger fingers Dr. Hyacinth Meeker ortho    JOINT REPLACEMENT     KNEE ARTHROSCOPY Left remote   MOHS SURGERY     left cheek scc 2022 Dr. Jeannine Boga   MOHS SURGERY     x 5 facial scc   right biceps tendon     repair/re attachment    SKIN CANCER EXCISION  10/2015   BCC - L ala (pending MOHs) and L scapula (complete excision)   TEE WITHOUT CARDIOVERSION N/A 07/29/2015   Procedure: TRANSESOPHAGEAL ECHOCARDIOGRAM (TEE);  Surgeon: Alleen Borne, MD;  Location: [facility] OR;  Service: Open Heart Surgery;  Laterality: N/A;   TONSILLECTOMY  1949  TOTAL KNEE ARTHROPLASTY Left 03/18/2016   cemented L TKR; Deeann Saint, MD   Patient Active Problem List   Diagnosis Date Noted   TIA (transient ischemic attack) 05/02/2023   GERD without esophagitis 05/02/2023   Carotid stenosis, symptomatic, with infarction (HCC) 04/26/2023   History of CVA (cerebrovascular accident) without residual deficits 03/04/2023   Acute CVA (cerebrovascular accident) (HCC) 02/22/2023   Chronic radicular pain of lower back 08/10/2022   Cervical spondylosis 03/11/2022   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   Bilateral carotid artery stenosis 03/11/2022   Lumbar spondylosis 10/07/2021   DDD (degenerative disc disease), lumbar 10/07/2021   History of radiation therapy 08/26/2021   Aortic atherosclerosis (HCC) 04/17/2021   Diverticulosis 04/17/2021   Hypertension associated with diabetes (HCC) 10/07/2020   Overweight (BMI 25.0-29.9) 10/07/2020   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   Vitamin D deficiency 08/01/2020   Polyp of sigmoid colon 08/01/2020   Insomnia 06/07/2020   Gastroesophageal reflux disease 04/04/2020   Degenerative joint disease of hand 03/01/2020   BPH (benign prostatic hyperplasia) 08/05/2018   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   Macular scar of both eyes 04/20/2018   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   Scotoma involving central area of both eyes 04/20/2018    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   Fatty liver 03/25/2018   Erectile dysfunction 02/23/2017   Hx of CABG 01/24/2017   Advanced care planning/counseling discussion 10/21/2015   Coronary artery disease of native artery of native heart with stable angina pectoris (HCC)    DM2 (diabetes mellitus, type 2) (HCC) 08/12/2015   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   Dyslipidemia 01/02/2015   Essential hypertension 01/02/2015   Osteoarthritis 01/02/2015   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/02/2014    ONSET DATE: 02/22/23  REFERRING DIAG: Z86.73 (ICD-10-CM) - History of CVA (cerebrovascular accident)   THERAPY DIAG:  Abnormality of gait and mobility  Muscle weakness (generalized)  Rationale for Evaluation and Treatment: Rehabilitation  SUBJECTIVE:                                                                                                                                                                                             SUBJECTIVE STATEMENT:  Pt he went to the MD and he is still under restrictions from work and driving. His loop recorder  doctor reported he should still limit his UE use until later this week.    Pt accompanied by: self  PERTINENT HISTORY:  Continued to progress high level balance this date, advancing volume and reducing recovery intervals between activities. Also incorporated work duty Counselling psychologist. Pt tolerates session well in general, shows general improvement with balance activities  within session. Pt continues to demonstrate excellent progress toward goals of care.   PMH: CAD s/p CABG, carotid stenosis, HTN, HLD, T2DM, CVA, Left TKA, chronic lumbar spine DJD and back pain, cervical spondylosis, BPH, insomnia.   PAIN:  Are you having pain? Yes, right knee at baseline level soreness   PRECAUTIONS: None  WEIGHT BEARING RESTRICTIONS: No  FALLS: Has patient fallen in last 6 months? No  PATIENT GOALS: To ensure he is physically at the level he was  prior to the stroke in April.   OBJECTIVE:    TODAY'S TREATMENT:     06/22/23      TE                                                                                                          -Nustep intervals LE only  -L2 x 1 min   -L4 x 1 min   -3 rounds of the above   Heel raises with UE for balance   - 3 x 15 reps   Reverse lunges x 10 reps ea LE  -cues for rear foot weight shift to toe rather than keeping heel planted   Seated LAQ with 4# AW 2 x 12 x 3 sec holds ea LE   Walking march with 4# AW 2 x 12 ea LE   Outdoor ambulation interval training 1 minute fast walk: 1 minute slow walk x 10 minutes with 1 rest break seated at 5 min, hills/inclines and surface changes throughout. Cues for increased step length and pretty frequent foot R LE lack of heel strike and shoe sliding on ground at initial contact. Limited change in gait speed with cues for fast walking interval but it did improve at times.   PATIENT EDUCATION: Education details: POC Person educated: Patient Education method: Explanation Education comprehension: verbalized understanding  HOME EXERCISE PROGRAM:  Access Code: [facility] URL: [email or web address] Date: 05/04/2023 Prepared by: Maureen Ralphs  Exercises - Seated Hip Flexion March with Ankle Weights  - 3 x weekly - 3 sets - 10 reps - Seated Long Arc Quad  - 3 x weekly - 3 sets - 10 reps - Seated Heel Raise  - 3 x weekly - 3 sets - 10 reps - Seated Toe Raise  - 3 x weekly - 3 sets - 10 reps - Seated Hip Abduction with Resistance  - 1 x daily - 3 x weekly - 3 sets - 10 reps    Access Code: [email or web address] URL: [email or web address] Date: 03/17/2023 Prepared by: Thresa Ross  Exercises - Standing Single Leg Stance with Counter Support  - 1 x daily - 7 x weekly - 2 sets - 30 second  hold - Tandem Stance  - 1 x daily - 7 x weekly - 2 sets - 30 second  hold - Forward Step Touch  - 1 x daily - 7 x weekly - 2 sets - 10  reps - Standing March with Counter Support  - 1 x daily - 7 x weekly - 2 sets - 10 reps  GOALS: Goals reviewed  with patient? Yes  SHORT TERM GOALS: Target date: 04/07/2023    Patient will be independent in home exercise program to improve strength/mobility for better functional independence with ADLs. Baseline: No HEP currently: 05/13/2023= patient reports feeling good about recent HEP and able to complete all HEP to date Goal status: MET   LONG TERM GOALS: Target date: 08/05/2023  1.  Patient will increase FOTO score to equal to or greater than  77   to demonstrate statistically significant improvement in mobility and quality of life.  Baseline: 67; 05/13/2023= 61 Goal status: Ongoing   2.  Patient will increase Berg Balance score by > 6 points to demonstrate decreased fall risk during functional activities. Baseline: 43; 05/13/2023=44 Goal status: Progressing   3.  Patient will improve DGI by 4 points to reduce fall risk and demonstrate improved transfer/gait ability. Baseline: 17; 05/13/2023= 17 Goal status: Ongoing 4.   Patient will increase six minute walk test distance to >1000 for progression to community ambulator and improve gait ability Baseline: visit 2 test 03/17/23: 850 ft; 05/13/2023= 830 feet with wide BOS Goal status: Ongoing    ASSESSMENT:  CLINICAL IMPRESSION:   Patient presents with good overall motivation for today's session. Pt had a lighter day with LE focus since his MD instruction was to not perform any heavy UE use in this session. Pt continues to progress with LE strengthening interventions this date and exercises to improve his endurance with ambulation. Pt will continue to benefit from skilled physical therapy intervention to address impairments, improve QOL, and attain therapy goals.     OBJECTIVE IMPAIRMENTS: Abnormal gait, decreased activity tolerance, decreased balance, and difficulty walking.   ACTIVITY LIMITATIONS: lifting and locomotion  level  PARTICIPATION LIMITATIONS: community activity, occupation, and yard work  PERSONAL FACTORS: Age are also affecting patient's functional outcome.   REHAB POTENTIAL: Excellent  CLINICAL DECISION MAKING: Stable/uncomplicated  EVALUATION COMPLEXITY: Low  PLAN:  PT FREQUENCY: 1-2x/week  PT DURATION: 8 weeks  PLANNED INTERVENTIONS: Therapeutic exercises, Therapeutic activity, Neuromuscular re-education, Balance training, Gait training, Patient/Family education, Self Care, Joint mobilization, Stair training, Dry Needling, Manual therapy, and Re-evaluation  PLAN FOR NEXT SESSION:  Continue to progress LE strength and dynamic balance as appropriate.    9:31 AM, 06/22/23   Norman Herrlich PT   Physical Therapist- [place]  [facility]

## 2023-06-23 ENCOUNTER — Ambulatory Visit (INDEPENDENT_AMBULATORY_CARE_PROVIDER_SITE_OTHER): Payer: Medicare Other | Admitting: Family Medicine

## 2023-06-23 ENCOUNTER — Encounter: Payer: Self-pay | Admitting: Family Medicine

## 2023-06-23 VITALS — BP 126/68 | HR 72 | Temp 97.7°F | Resp 16 | Ht 72.0 in | Wt 197.5 lb

## 2023-06-23 DIAGNOSIS — E118 Type 2 diabetes mellitus with unspecified complications: Secondary | ICD-10-CM

## 2023-06-23 DIAGNOSIS — Z7984 Long term (current) use of oral hypoglycemic drugs: Secondary | ICD-10-CM

## 2023-06-23 DIAGNOSIS — Z0000 Encounter for general adult medical examination without abnormal findings: Secondary | ICD-10-CM | POA: Diagnosis not present

## 2023-06-23 DIAGNOSIS — I700 Atherosclerosis of aorta: Secondary | ICD-10-CM

## 2023-06-23 DIAGNOSIS — I63239 Cerebral infarction due to unspecified occlusion or stenosis of unspecified carotid arteries: Secondary | ICD-10-CM

## 2023-06-23 DIAGNOSIS — Z8673 Personal history of transient ischemic attack (TIA), and cerebral infarction without residual deficits: Secondary | ICD-10-CM | POA: Diagnosis not present

## 2023-06-23 DIAGNOSIS — E1159 Type 2 diabetes mellitus with other circulatory complications: Secondary | ICD-10-CM | POA: Diagnosis not present

## 2023-06-23 DIAGNOSIS — I152 Hypertension secondary to endocrine disorders: Secondary | ICD-10-CM

## 2023-06-23 MED ORDER — LISINOPRIL 10 MG PO TABS: 10.0000 mg | ORAL_TABLET | Freq: Every day | ORAL | 3 refills | Status: DC

## 2023-06-23 MED ORDER — LISINOPRIL 10 MG PO TABS
10.0000 mg | ORAL_TABLET | Freq: Every day | ORAL | 3 refills | Status: DC
Start: 2023-06-23 — End: 2023-08-13

## 2023-06-23 NOTE — Progress Notes (Signed)
SUBJECTIVE:   Chief Complaint  Patient presents with   Annual Exam   HPI Patient presents to clinic for annual physical  No acute concerns today  DM Type 2 Asymptomatic.  Recent A1c 6.3.   Compliant with Metformin 500 mg twice a day.   Doesn't check CBG's at home.  HTN Asymptomatic.  Compliant with Metoprolol XL 25 mg daily.  HLD Takes Atorvastatin 40 mg daily and Zetia 10 mg daily. Tolerating well and no myalgias. Recent LDL 59  Seizure disorder/TIA/Aphasia Newly diagnosed. No further seizure activity since last admission.  Recently seen [facility], Dr Marjory Lies. Taking Keppra 500 mg BID and tolerating well.  Continuing stroke prevention, DAPT, statin, BP and DM management.    Review of Systems - General ROS: negative except listed above   PERTINENT PMH / PSH: DM Type 2 HTN BPH Seizure disorder   OBJECTIVE:  BP 126/68   Pulse 72   Temp 97.7 F (36.5 C)   Resp 16   Ht 6' (1.829 m)   Wt 197 lb 8 oz (89.6 kg)   SpO2 98%   BMI 26.79 kg/m    Physical Exam Vitals reviewed.  HENT:     Head: Normocephalic.     Right Ear: Tympanic membrane, ear canal and external ear normal.     Left Ear: Tympanic membrane, ear canal and external ear normal.     Nose: Nose normal.     Mouth/Throat:     Mouth: Mucous membranes are moist.  Eyes:     Conjunctiva/sclera: Conjunctivae normal.     Pupils: Pupils are equal, round, and reactive to light.  Neck:     Thyroid: No thyromegaly or thyroid tenderness.     Vascular: No carotid bruit.  Cardiovascular:     Rate and Rhythm: Normal rate and regular rhythm.     Pulses: Normal pulses.     Heart sounds: Normal heart sounds.  Pulmonary:     Effort: Pulmonary effort is normal.     Breath sounds: Normal breath sounds.  Abdominal:     General: Abdomen is flat. Bowel sounds are normal.     Palpations: Abdomen is soft.  Musculoskeletal:        General: Normal range of motion.     Cervical back: Normal range of motion and neck supple.      Right lower leg: No edema.     Left lower leg: No edema.  Lymphadenopathy:     Cervical: No cervical adenopathy.  Neurological:     Mental Status: He is alert.  Psychiatric:        Mood and Affect: Mood normal.        Behavior: Behavior normal.        Thought Content: Thought content normal.        Judgment: Judgment normal.       06/23/2023    1:02 PM 05/28/2023   11:21 AM 05/05/2023   11:11 AM 03/04/2023    8:08 AM 03/04/2022    1:39 PM  Depression screen PHQ 2/9  Decreased Interest 0 0 0 0 0  Down, Depressed, Hopeless 0 0 0 0 0  PHQ - 2 Score 0 0 0 0 0  Altered sleeping 0 0 0    Tired, decreased energy 0 0 0    Change in appetite 0  0    Feeling bad or failure about yourself  0 0 0    Trouble concentrating 0 0 0    Moving slowly  or fidgety/restless 0  0    Suicidal thoughts 0 0 0    PHQ-9 Score 0 0 0    Difficult doing work/chores Not difficult at all Not difficult at all Not difficult at all          06/23/2023    1:02 PM 05/05/2023   11:11 AM  GAD 7 : Generalized Anxiety Score  Nervous, Anxious, on Edge 0 0  Control/stop worrying 0 0  Worry too much - different things 0 0  Trouble relaxing 0 0  Restless 0 0  Easily annoyed or irritable 0 0  Afraid - awful might happen 0 0  Total GAD 7 Score 0 0  Anxiety Difficulty Not difficult at all Not difficult at all     ASSESSMENT/PLAN:  Annual physical exam Assessment & Plan: PHQ9/GAD screening negative Aged out for colon cancer screening No indication for continue PSA screening Low fall risk Continue healthy diet and activity as tolerated Vaccines up to date    Hypertension associated with diabetes [facility]) Assessment & Plan: Chronic Well controlled Continue Metoprolol XL 25 mg daily Refill Lisinopril 10 mg daily   Orders: -     Lisinopril; Take 1 tablet (10 mg total) by mouth daily.  Dispense: 90 tablet; Refill: 3  Aortic atherosclerosis (HCC) Assessment & Plan: Noted on CT abd/plelvis 04/2021 On  statin therapy and tolerating well. Continue Lipitor 40 mg daily      History of CVA (cerebrovascular accident) without residual deficits Assessment & Plan: Crytptogenic CVA Back to baseline Continue DAPT per Neurology Continue statin therapy Continue Keppra per Neurology Maintain blood pressure control less than 130/80     Carotid stenosis, symptomatic, with infarction [facility]) Assessment & Plan: Follows with Vascular No plans for endarterectomy at this time   Type 2 diabetes mellitus with complications (HCC) Assessment & Plan: Chronic. Stable Recent A1c 6.3 Continue Metformin 500 mg BID On ACEi, statin, and ASA      PDMP reviewed  Return in about 6 months (around 12/24/2023) for PCP.  Dana Allan, MD

## 2023-06-23 NOTE — Patient Instructions (Addendum)
It was a pleasure meeting you today. Thank you for allowing me to take part in your health care.  Our goals for today as we discussed include:  Refill sent for Lisinopril  Follow up with Neurology as scheduled Follow up with Vascular as scheduled Follow up with Cardiology as scheduled  Follow up in 6 months   If you have any questions or concerns, please do not hesitate to call the office at [phone].  I look forward to our next visit and until then take care and stay safe.  Regards,   Dana Allan, MD   [facility]

## 2023-06-24 ENCOUNTER — Ambulatory Visit: Payer: Medicare Other | Admitting: Physical Therapy

## 2023-06-24 DIAGNOSIS — M6281 Muscle weakness (generalized): Secondary | ICD-10-CM

## 2023-06-24 DIAGNOSIS — R269 Unspecified abnormalities of gait and mobility: Secondary | ICD-10-CM

## 2023-06-24 NOTE — Therapy (Signed)
OUTPATIENT PHYSICAL THERAPY TREATMENT/ Physical Therapy Progress Note   Dates of reporting period  05/18/23   to   06/24/23     Patient Name: Shannon Chung MRN: [id number] DOB:08-Oct-1941, 82 y.o., male Today's Date: 06/25/2023   PCP: Dana Allan, MD  REFERRING PROVIDER:   Dana Allan, MD    END OF SESSION:  PT End of Session - 06/24/23 1614     Visit Number 20    Number of Visits 33    Date for PT Re-Evaluation 08/05/23    Authorization Type [employer] Medicare    Authorization Time Period 05/13/23-08/05/23    Progress Note Due on Visit 20    PT Start Time 1615    PT Stop Time 1657    PT Time Calculation (min) 42 min    Equipment Utilized During Treatment Gait belt    Activity Tolerance Patient tolerated treatment well;No increased pain;Patient limited by fatigue    Behavior During Therapy [facility] for tasks assessed/performed                     Past Medical History:  Diagnosis Date   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 06/2015 Cardiac CT: Ca score 1103 (84th %'ile);  b. 07/2015 Cath: LM 70, LAD 80p, 100/8m, D1 70, D2 95, RI 75, RCA 100p/m;  c. 07/2015 CABG x 5 (LIMA->LAD, VG->Diag, VG->OM1->OM2, VG->OM3).   COVID-19    12/2021   COVID-19 01/18/2022   COVID-19 vaccine administered 01/18/2022   Unknown how many vaccine doses have been received. Entered from Emergency Triage Note.   Diabetes mellitus without complication (HCC) 07/2015   Dyslipidemia    Essential hypertension    Essential hypertension 01/02/2015   Formatting of this note might be  different from the original.  Last Assessment & Plan:   Chronic, stable. Continue current regimen.   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 dilated, EF 35% improved to 45-50% (2015);  b. 07/2015 EF 25-35% by LV gram.   Ischemic cardiomyopathy 01/02/2015   Kidney stones 04/17/2021   Lone atrial fibrillation (HCC) 1983   a. isolated episode, not on OAC.   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 06/2014: LV; resolved with coumadin-->no residual on f/u echo, no longer on coumadin.   Mural thrombus of heart 08/26/2021   Formatting of this note might be different from the original. Jun 16, 2019 Entered By: [name] Comment: left ventricle, cardiac apex   Osteoarthritis    a. 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 Jun 16, 2019 Entered By: [name] Comment: leftAug 19, 2020 Entered By: [name] Comment: resolved by total left knee replacement Jun 16, 2019 Entered By: [name] Comment: leftAug 19, 2020 Entered By: [name] Comment: resolved by total left knee replacement   Trigger finger of left hand 07/07/2019   Vitamin D deficiency    Past Surgical History:  Procedure Laterality Date   BICEPS TENDON REPAIR Right 1993   CARDIAC CATHETERIZATION N/A 07/05/2015   Procedure: Left Heart Cath and Coronary Angiography;  Surgeon: Antonieta Iba, MD;  Location: [facility] INVASIVE CV LAB;  Service: Cardiovascular;  Laterality: N/A;   CAROTID PTA/STENT INTERVENTION Left 04/26/2023   Procedure: CAROTID PTA/STENT INTERVENTION;  Surgeon: Annice Needy, MD;  Location: [facility] INVASIVE CV LAB;  Service: Cardiovascular;  Laterality: Left;   CATARACT EXTRACTION Left 12/2016   with keratoplasty   COLONOSCOPY  2007   COLONOSCOPY WITH PROPOFOL N/A 12/02/2017   TA, SSA, rpt 3 yrs(Tahiliani, Varnita B, MD)   COLONOSCOPY WITH PROPOFOL N/A 11/26/2020   Procedure: COLONOSCOPY WITH PROPOFOL;  Surgeon: Midge Minium, MD;  Location: [facility] ENDOSCOPY;  Service: Endoscopy;  Laterality: N/A;   CORONARY ARTERY BYPASS GRAFT N/A 07/29/2015   Procedure: CORONARY ARTERY BYPASS GRAFTING (CABG) x 5 (LIMA to LAD, SVG to DIAGONAL,  SVG SEQUENTIALLY to OM1 and OM2, SVG to OM3) with Endoscopic Vein Havesting of  GREATER SAPHENOUS VEIN from RIGHT THIGH and partial LOWER LEG ;  Surgeon: Alleen Borne, MD;  Location: [facility] OR;   Service: Open Heart Surgery;  Laterality: N/A;   EYE SURGERY     b/l cataract and cornea replaced    HAND SURGERY     left hand 1st/2nd trigger fingers Dr. [facility] orth[facility]    JOINT REPLACEMENT     KNEE ARTHROSCOPY Left remote   MOHS SURGERY     left cheek scc 2022 Dr. Jeannine Boga   MOHS SURGERY     x 5 facial scc   right biceps tendon     repair/re attachment    SKIN CANCER EXCISION  10/2015   BCC - L ala (pending MOHs) and L scapula (complete excision)   TEE WITHOUT CARDIOVERSION N/A 07/29/2015   Procedure: TRANSESOPHAGEAL ECHOCARDIOGRAM (TEE);  Surgeon: Alleen Borne,  MD;  Location: [facility] OR;  Service: Open Heart Surgery;  Laterality: N/A;   TONSILLECTOMY  1949   TOTAL KNEE ARTHROPLASTY Left 03/18/2016   cemented L TKR; Deeann Saint, MD   Patient Active Problem List   Diagnosis Date Noted   TIA (transient ischemic attack) 05/02/2023   GERD without esophagitis 05/02/2023   Carotid stenosis, symptomatic, with infarction (HCC) 04/26/2023   History of CVA (cerebrovascular accident) without residual deficits 03/04/2023   Acute CVA (cerebrovascular accident) (HCC) 02/22/2023   Chronic radicular pain of lower back 08/10/2022   Cervical spondylosis 03/11/2022   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   Bilateral carotid artery stenosis 03/11/2022   Lumbar spondylosis 10/07/2021   DDD (degenerative disc disease), lumbar 10/07/2021   History of radiation therapy 08/26/2021   Aortic atherosclerosis (HCC) 04/17/2021   Diverticulosis 04/17/2021   Hypertension associated with diabetes (HCC) 10/07/2020   Overweight (BMI 25.0-29.9) 10/07/2020   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   Vitamin D deficiency 08/01/2020   Polyp of sigmoid colon 08/01/2020   Insomnia 06/07/2020   Gastroesophageal reflux disease 04/04/2020   Degenerative joint disease of hand 03/01/2020   BPH (benign prostatic hyperplasia) 08/05/2018   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   Macular scar of both eyes 04/20/2018    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   Scotoma involving central area of both eyes 04/20/2018   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   Fatty liver 03/25/2018   Erectile dysfunction 02/23/2017   Hx of CABG 01/24/2017   Advanced care planning/counseling discussion 10/21/2015   Coronary artery disease of native artery of native heart with stable angina pectoris (HCC)    DM2 (diabetes mellitus, type 2) (HCC) 08/12/2015   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   Dyslipidemia 01/02/2015   Osteoarthritis 01/02/2015   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/02/2014    ONSET DATE: 02/22/23  REFERRING DIAG: Z86.73 (ICD-10-CM) - History of CVA (cerebrovascular accident)   THERAPY DIAG:  Abnormality of gait and mobility  Muscle weakness (generalized)  Rationale for Evaluation and Treatment: Rehabilitation  SUBJECTIVE:                                                                                                                                                                                             SUBJECTIVE STATEMENT:  Pt he went to the MD and he is still under restrictions from work and driving. Pt discusses he is happy with functional progress and okay with discharge at this time.    Pt accompanied by: self  PERTINENT HISTORY:  Continued to progress high level balance this date, advancing volume and reducing recovery intervals between activities. Also incorporated work duty  simulation. Pt tolerates session well in general, shows general improvement with balance activities within session. Pt continues to demonstrate excellent progress toward goals of care.   PMH: CAD s/p CABG, carotid stenosis, HTN, HLD, T2DM, CVA, Left TKA, chronic lumbar spine DJD and back pain, cervical spondylosis, BPH, insomnia.   PAIN:  Are you having pain? Yes, right knee at baseline level soreness   PRECAUTIONS: None  WEIGHT BEARING RESTRICTIONS: No  FALLS: Has patient fallen in last 6 months? No  PATIENT  GOALS: To ensure he is physically at the level he was prior to the stroke in April.   OBJECTIVE:    TODAY'S TREATMENT:     06/25/23  Physical therapy treatment session today consisted of completing assessment of goals and administration of testing as demonstrated and documented in flow sheet, treatment, and goals section of this note. Addition treatments may be found below.   Reviewed Advanced HEP as below     Access Code: [email or web address] URL: [email or web address] Date: 06/25/2023 Prepared by: Thresa Ross  Exercises - Sit to Stand  - 1 x daily - 5 x weekly - 2 sets - 10 reps - Lunge with Counter Support  - 1 x daily - 5 x weekly - 2 sets - 10 reps - Tandem Stance with Support  - 1 x daily - 5 x weekly - 2 sets - 30 second  hold - Standing March with Counter Support  - 1 x daily - 7 x weekly - 2 sets - 10 reps - Heel Toe Raises with Counter Support  - 1 x daily - 7 x weekly - 3 sets - 15 reps   [facility] PT Assessment - 06/24/23       Berg Balance Test   Standing Unsupported Able to stand safely 2 minutes    Sitting with Back Unsupported but Feet Supported on Floor or Stool Able to sit safely and securely 2 minutes    Stand to Sit Sits safely with minimal use of hands    Transfers Able to transfer safely, minor use of hands    Standing Unsupported with Eyes Closed Able to stand 10 seconds safely    Standing Unsupported with Feet Together Able to place feet together independently and stand 1 minute safely    From Standing, Reach Forward with Outstretched Arm Can reach confidently >25 cm (10")    From Standing Position, Pick up Object from Floor Able to pick up shoe safely and easily    From Standing Position, Turn to Look Behind Over each Shoulder Looks behind from both sides and weight shifts well    Turn 360 Degrees Able to turn 360 degrees safely but slowly    Standing Unsupported, Alternately Place Feet on Step/Stool Able to stand independently and safely and complete 8  steps in 20 seconds    Standing Unsupported, One Foot in Front Able to plae foot ahead of the other independently and hold 30 seconds    Standing on One Leg Tries to lift leg/unable to hold 3 seconds but remains standing independently      Dynamic Gait Index   Level Surface Normal    Change in Gait Speed Normal    Gait with Horizontal Head Turns Normal    Gait with Vertical Head Turns Mild Impairment    Gait and Pivot Turn Mild Impairment    Step Over Obstacle Mild Impairment    Step Around Obstacles Normal    Steps Mild Impairment    Total Score 20  PATIENT EDUCATION: Education details: POC Person educated: Patient Education method: Explanation Education comprehension: verbalized understanding  HOME EXERCISE PROGRAM:  Access Code: [facility] URL: [email or web address] Date: 06/25/2023 Prepared by: Thresa Ross  Exercises - Sit to Stand  - 1 x daily - 5 x weekly - 2 sets - 10 reps - Lunge with Counter Support  - 1 x daily - 5 x weekly - 2 sets - 10 reps - Tandem Stance with Support  - 1 x daily - 5 x weekly - 2 sets - 30 second  hold - Standing March with Counter Support  - 1 x daily - 7 x weekly - 2 sets - 10 reps - Heel Toe Raises with Counter Support  - 1 x daily - 7 x weekly - 3 sets - 15 reps  GOALS: Goals reviewed with patient? Yes  SHORT TERM GOALS: Target date: 04/07/2023    Patient will be independent in home exercise program to improve strength/mobility for better functional independence with ADLs. Baseline: No HEP currently: 05/13/2023= patient reports feeling good about recent HEP and able to complete all HEP to date Goal status: MET   LONG TERM GOALS: Target date: 08/05/2023  1.  Patient will increase FOTO score to equal to or greater than  77   to demonstrate statistically significant improvement in mobility and quality of life.  Baseline: 67; 05/13/2023= 61 8/22:67 Goal status: Ongoing   2.  Patient will increase Berg  Balance score by > 6 points to demonstrate decreased fall risk during functional activities. Baseline: 43; 05/13/2023=44 8/22: 50/56 Goal status: Progressing   3.  Patient will improve DGI by 4 points to reduce fall risk and demonstrate improved transfer/gait ability. Baseline: 17; 05/13/2023= 17 Goal status: Ongoing 4.   Patient will increase six minute walk test distance to >1000 for progression to community ambulator and improve gait ability Baseline: visit 2 test 03/17/23: 850 ft; 05/13/2023= 830 feet with wide BOS 8/22:960 ft  Goal status: Ongoing    ASSESSMENT:  CLINICAL IMPRESSION:   Patient presents for progress note.  Patient has met or nearly met all of his physical therapy goals at this time.  Physical therapist discusses potential for discharge with patient or potential for ongoing therapy to continue to work toward his goals.  Patient feels he is comfortable with his functional capacity at this time and based on how close patient is to meeting all of his goals physical therapist is comfortable with discharge at this time as well.  Patient educated regarding beginning at the well zone and continuing with home exercise program.  Patient provided with updated HEP this date to continue to work on his lower extremity strength and mobility.Pt will be discharged form skilled PT at this time.     OBJECTIVE IMPAIRMENTS: Abnormal gait, decreased activity tolerance, decreased balance, and difficulty walking.   ACTIVITY LIMITATIONS: lifting and locomotion level  PARTICIPATION LIMITATIONS: community activity, occupation, and yard work  PERSONAL FACTORS: Age are also affecting patient's functional outcome.   REHAB POTENTIAL: Excellent  CLINICAL DECISION MAKING: Stable/uncomplicated  EVALUATION COMPLEXITY: Low  PLAN:  PT FREQUENCY: 1-2x/week  PT DURATION: 8 weeks  PLANNED INTERVENTIONS: Therapeutic exercises, Therapeutic activity, Neuromuscular re-education, Balance training, Gait  training, Patient/Family education, Self Care, Joint mobilization, Stair training, Dry Needling, Manual therapy, and Re-evaluation  PLAN FOR NEXT SESSION:  Continue to progress LE strength and dynamic balance as appropriate.    8:09 AM, 06/25/23   Norman Herrlich PT  Physical Therapist- [place]  [facility]

## 2023-06-25 ENCOUNTER — Encounter: Payer: Self-pay | Admitting: Physical Therapy

## 2023-06-29 ENCOUNTER — Ambulatory Visit: Payer: Medicare Other

## 2023-07-01 ENCOUNTER — Ambulatory Visit: Payer: Medicare Other

## 2023-07-06 ENCOUNTER — Ambulatory Visit: Payer: Medicare Other

## 2023-07-08 ENCOUNTER — Ambulatory Visit: Payer: Medicare Other

## 2023-07-10 ENCOUNTER — Encounter: Payer: Self-pay | Admitting: Family Medicine

## 2023-07-10 DIAGNOSIS — Z0000 Encounter for general adult medical examination without abnormal findings: Secondary | ICD-10-CM | POA: Insufficient documentation

## 2023-07-10 NOTE — Assessment & Plan Note (Signed)
Chronic. Stable Recent A1c 6.3 Continue Metformin 500 mg BID On ACEi, statin, and ASA

## 2023-07-10 NOTE — Assessment & Plan Note (Signed)
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

## 2023-07-10 NOTE — Assessment & Plan Note (Signed)
Follows with Vascular No plans for endarterectomy at this time

## 2023-07-10 NOTE — Assessment & Plan Note (Signed)
Noted on CT abd/plelvis 04/2021 On statin therapy and tolerating well. Continue Lipitor 40 mg daily

## 2023-07-10 NOTE — Assessment & Plan Note (Signed)
PHQ9/GAD screening negative Aged out for colon cancer screening No indication for continue PSA screening Low fall risk Continue healthy diet and activity as tolerated Vaccines up to date

## 2023-07-10 NOTE — Assessment & Plan Note (Signed)
Chronic Well controlled Continue Metoprolol XL 25 mg daily Refill Lisinopril 10 mg daily

## 2023-07-13 ENCOUNTER — Ambulatory Visit: Payer: Medicare Other | Admitting: Physical Therapy

## 2023-07-15 ENCOUNTER — Ambulatory Visit: Payer: Medicare Other

## 2023-07-19 ENCOUNTER — Ambulatory Visit: Payer: Medicare Other

## 2023-07-19 DIAGNOSIS — I639 Cerebral infarction, unspecified: Secondary | ICD-10-CM | POA: Diagnosis not present

## 2023-07-20 ENCOUNTER — Ambulatory Visit: Payer: Medicare Other | Admitting: Physical Therapy

## 2023-07-20 LAB — CUP PACEART REMOTE DEVICE CHECK
Date Time Interrogation Session: 20240915222708
Implantable Pulse Generator Implant Date: 20240813

## 2023-07-22 ENCOUNTER — Ambulatory Visit: Payer: Medicare Other | Admitting: Physical Therapy

## 2023-07-27 ENCOUNTER — Ambulatory Visit: Payer: Medicare Other | Admitting: Physical Therapy

## 2023-07-29 ENCOUNTER — Ambulatory Visit: Payer: Medicare Other | Admitting: Physical Therapy

## 2023-08-03 ENCOUNTER — Ambulatory Visit: Payer: Medicare Other | Admitting: Physical Therapy

## 2023-08-04 NOTE — Progress Notes (Signed)
Carelink Summary Report / Loop Recorder 

## 2023-08-05 ENCOUNTER — Ambulatory Visit: Payer: Medicare Other | Admitting: Physical Therapy

## 2023-08-10 ENCOUNTER — Ambulatory Visit: Payer: Medicare Other | Admitting: Physical Therapy

## 2023-08-12 ENCOUNTER — Ambulatory Visit: Payer: Medicare Other | Admitting: Physical Therapy

## 2023-08-13 ENCOUNTER — Other Ambulatory Visit: Payer: Self-pay

## 2023-08-13 ENCOUNTER — Telehealth: Payer: Self-pay

## 2023-08-13 DIAGNOSIS — I152 Hypertension secondary to endocrine disorders: Secondary | ICD-10-CM

## 2023-08-13 NOTE — Telephone Encounter (Signed)
Prescription Request  08/13/2023  LOV: Visit date not found  What is the name of the medication or equipment? metoprolol succinate (TOPROL-XL) 25 MG 24 hr tablet, atorvastatin (LIPITOR) 40 MG tablet  Have you contacted your pharmacy to request a refill? Yes   Which pharmacy would you like this sent to?  [facility] - [address], [place], [place] Phone:  [phone]    Patient notified that their request is being sent to the clinical staff for review and that they should receive a response within 2 business days.   Please advise at Mobile [phone] (mobile)  Patient states he was only supposed to be in [place] for five days and he had enough medication for that time, but he fell and now he will be there three more days.  Patient states he would like for [place] to please call him when we send the prescriptions to the pharmacy.

## 2023-08-15 MED ORDER — METOPROLOL SUCCINATE ER 25 MG PO TB24: 25.0000 mg | ORAL_TABLET | Freq: Every day | ORAL | 2 refills | Status: DC

## 2023-08-15 MED ORDER — LISINOPRIL 10 MG PO TABS: 10.0000 mg | ORAL_TABLET | Freq: Every day | ORAL | 3 refills | Status: DC

## 2023-08-17 ENCOUNTER — Ambulatory Visit: Payer: Medicare Other | Admitting: Physical Therapy

## 2023-08-19 ENCOUNTER — Ambulatory Visit: Payer: Medicare Other | Admitting: Physical Therapy

## 2023-08-23 ENCOUNTER — Ambulatory Visit: Payer: Medicare Other

## 2023-08-23 ENCOUNTER — Other Ambulatory Visit: Payer: Self-pay | Admitting: Orthopedic Surgery

## 2023-08-23 DIAGNOSIS — I639 Cerebral infarction, unspecified: Secondary | ICD-10-CM

## 2023-08-23 DIAGNOSIS — S76019A Strain of muscle, fascia and tendon of unspecified hip, initial encounter: Secondary | ICD-10-CM

## 2023-08-23 DIAGNOSIS — S7001XA Contusion of right hip, initial encounter: Secondary | ICD-10-CM

## 2023-08-23 LAB — CUP PACEART REMOTE DEVICE CHECK
Date Time Interrogation Session: 20241020231831
Implantable Pulse Generator Implant Date: 20240813

## 2023-08-24 ENCOUNTER — Ambulatory Visit: Payer: Medicare Other | Admitting: Urology

## 2023-08-24 ENCOUNTER — Ambulatory Visit: Payer: Medicare Other | Admitting: Physical Therapy

## 2023-08-24 VITALS — Ht 72.0 in | Wt 199.0 lb

## 2023-08-24 DIAGNOSIS — C61 Malignant neoplasm of prostate: Secondary | ICD-10-CM | POA: Diagnosis not present

## 2023-08-24 DIAGNOSIS — N400 Enlarged prostate without lower urinary tract symptoms: Secondary | ICD-10-CM | POA: Diagnosis not present

## 2023-08-24 LAB — BLADDER SCAN AMB NON-IMAGING: Scan Result: 134

## 2023-08-24 NOTE — Progress Notes (Signed)
I,[name],acting as a scribe for Vanna Scotland, MD.,have documented all relevant documentation on the behalf of Vanna Scotland, MD,as directed by  Vanna Scotland, MD while in the presence of Vanna Scotland, MD.  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  He was previously followed by Dr. Mena Goes in [place] with his last visit in May 2024 and elected to continue active surveillance in light of a recent stroke. He sought a second opinion in February 2023 by Dr. Greggory Stallion at [facility]. He's had two radiation oncology opinions [place]. Both records from [facility] and [facility] reviewed (and scanned under media) from 04/07/2023.  He began to have rising PSA in 2020. He underwent a prostate MRI in October 2022, that showed a PI-RADS 4 x 2  with a bilateral mid gland and peripheral zones. At the time his PSA was 4.77. He had a fusion biopsy in November 2023, that showed 7 of 16 cores with Gleason 3+3 and 3+4 prostate cancer.  His most recent PSA on 03/03/2023 was 6.0.  He is not currently on any BPH medications.   He mentions he fell two weeks ago and has been using a walker for balance.  He expressed having some concerns regarding the rising PSA but not overly worried. He said his previous doctor mentioned if its gets to 10 then it would be more serious.   He has had a consultation with Dr. Rushie Chestnut in the past.    Results for orders placed or performed in visit on 08/24/23  Bladder Scan (Post Void Residual) in office  Result Value Ref Range   Scan Result 134 ml     IPSS     Row Name 08/24/23 1400          International Prostate Symptom Score   How often have you had the sensation of not emptying your bladder? Not at All     How often have you had to urinate less than every two hours? Less than half the time     How often have you found you stopped and started again several times when you urinated? Not at All     How often have you found it difficult to postpone urination? Less than half the time     How often have you had a weak urinary stream? Less than 1 in 5 times     How often have you had to strain to start urination? Not at All     How many times did you typically get up at night to urinate? 3 Times     Total IPSS Score 8       Quality of Life due to urinary symptoms   If you were to spend the rest of your life with your urinary condition just the way it is now how would you feel about that? 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 06/2015 Cardiac CT: Ca score 1103 (84th %'ile);  b. 07/2015 Cath: LM 70, LAD 80p, 100/66m, D1 70, D2 95, RI 75, RCA 100p/m;  c. 07/2015 CABG x 5 (LIMA->LAD, VG->Diag, VG->OM1->OM2, VG->OM3).   COVID-19    12/2021   COVID-19 01/18/2022   COVID-19 vaccine administered 01/18/2022   Unknown how many vaccine doses have been received. Entered from Emergency Triage Note.   Diabetes mellitus without complication (HCC) 07/2015   Dyslipidemia    Essential hypertension    Essential hypertension 01/02/2015   Formatting of this note might be  different from the original.  Last Assessment & Plan:   Chronic, stable. Continue current regimen.   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 dilated, EF 35% improved to 45-50% (2015);  b. 07/2015 EF 25-35% by LV gram.   Ischemic cardiomyopathy 01/02/2015   Kidney stones 04/17/2021   Lone atrial fibrillation (HCC) 1983   a. isolated episode, not on OAC.   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 06/2014: LV; resolved with coumadin-->no residual on f/u echo, no longer on coumadin.   Mural thrombus of heart 08/26/2021   Formatting of this note might be different from the original. Jun 16, 2019 Entered By: [name] Comment: left ventricle, cardiac apex   Osteoarthritis    a. 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 Jun 16, 2019 Entered  By: [name] Comment: leftAug 19, 2020 Entered By: [name] Comment: resolved by total left knee replacement Jun 16, 2019 Entered By: [name] Comment: leftAug 19, 2020 Entered By: [name] Comment: resolved by total left knee replacement   Trigger finger of left hand 07/07/2019   Vitamin D deficiency     Surgical History: Past Surgical History:  Procedure Laterality Date   BICEPS TENDON REPAIR Right 1993   CARDIAC CATHETERIZATION N/A 07/05/2015   Procedure: Left Heart Cath and Coronary Angiography;  Surgeon: Antonieta Iba, MD;  Location: [facility] INVASIVE CV LAB;  Service: Cardiovascular;  Laterality: N/A;   CAROTID PTA/STENT INTERVENTION Left 04/26/2023   Procedure: CAROTID PTA/STENT INTERVENTION;  Surgeon: Annice Needy, MD;  Location: [facility] INVASIVE CV LAB;  Service: Cardiovascular;  Laterality: Left;   CATARACT EXTRACTION Left 12/2016   with keratoplasty   COLONOSCOPY  2007   COLONOSCOPY WITH PROPOFOL N/A 12/02/2017   TA, SSA, rpt 3 yrs(Tahiliani, Varnita B, MD)   COLONOSCOPY WITH PROPOFOL N/A 11/26/2020   Procedure: COLONOSCOPY WITH PROPOFOL;  Surgeon: Midge Minium, MD;  Location: [facility] ENDOSCOPY;  Service: Endoscopy;  Laterality: N/A;   CORONARY ARTERY BYPASS GRAFT N/A 07/29/2015   Procedure: CORONARY ARTERY BYPASS GRAFTING (CABG) x 5 (LIMA to LAD, SVG to DIAGONAL,  SVG SEQUENTIALLY to OM1 and OM2, SVG to OM3) with Endoscopic Vein Havesting of  GREATER SAPHENOUS VEIN from RIGHT THIGH and partial LOWER LEG ;  Surgeon: Alleen Borne, MD;   Location: [facility] OR;  Service: Open Heart Surgery;  Laterality: N/A;   EYE SURGERY     b/l cataract and cornea replaced    HAND SURGERY     left hand 1st/2nd trigger fingers Dr. Hyacinth Meeker ortho    JOINT REPLACEMENT     KNEE ARTHROSCOPY Left remote   MOHS SURGERY     left cheek scc 2022 Dr. Jeannine Boga   MOHS SURGERY     x 5 facial scc   right biceps tendon     repair/re attachment    SKIN CANCER EXCISION  10/2015   BCC - L ala (pending MOHs) and L scapula (complete excision)   TEE WITHOUT CARDIOVERSION N/A 07/29/2015   Procedure: TRANSESOPHAGEAL ECHOCARDIOGRAM (TEE);  Surgeon: Alleen Borne, MD;  Location: [facility] OR;  Service: Open Heart Surgery;  Laterality: N/A;   TONSILLECTOMY  1949   TOTAL KNEE ARTHROPLASTY Left 03/18/2016   cemented L TKR; Deeann Saint, MD    Home Medications:  Allergies as of 08/24/2023       Reactions   Pollen Extract Itching        Medication List        Accurate as of August 24, 2023  3:25 PM. If you have any questions, ask your nurse or doctor.          acetaminophen 500 MG tablet Commonly known as: TYLENOL Take 500 mg by mouth every 6 (six) hours as needed for mild pain.   aspirin EC 81 MG tablet Take 162 mg by mouth daily. Swallow whole.   atorvastatin 40 MG tablet Commonly known as: LIPITOR Take 1 tablet (40 mg total) by mouth daily.   Blood Pressure Kit Check blood pressure two to three times a week   clopidogrel 75 MG tablet Commonly known as: Plavix Take 1 tablet (75 mg total) by mouth daily.   ezetimibe 10 MG tablet Commonly known as: ZETIA Take 1 tablet (10 mg total) by mouth daily.   famotidine 20 MG tablet Commonly  known as: PEPCID TAKE 1 TABLET BY MOUTH 2 TIMES DAILY AS NEEDED FOR HEARTBURN/INDIGESTION. D/C ZANTAC   fluticasone 50 MCG/ACT nasal spray Commonly known as: FLONASE Place 1 spray into both nostrils daily as needed for allergies.   levETIRAcetam 500 MG tablet Commonly known as: KEPPRA Take 1 tablet (500 mg  total) by mouth 2 (two) times daily.   lisinopril 10 MG tablet Commonly known as: ZESTRIL Take 1 tablet (10 mg total) by mouth daily.   metFORMIN 500 MG tablet Commonly known as: GLUCOPHAGE Take 1 tablet (500 mg total) by mouth 2 (two) times daily with a meal.   metoprolol succinate 25 MG 24 hr tablet Commonly known as: TOPROL-XL Take 1 tablet (25 mg total) by mouth daily.   prednisoLONE acetate 1 % ophthalmic suspension Commonly known as: PRED FORTE Place 1 drop into both eyes daily.   sildenafil 20 MG tablet Commonly known as: REVATIO TAKE 1 TABLET BY MOUTH DAILY AS NEEDED   vitamin B-12 500 MCG tablet Commonly known as: CYANOCOBALAMIN Take 500 mcg by mouth daily.   Vitamin D-3 125 MCG (5000 UT) Tabs Take 5,000 Units by mouth daily.        Allergies:  Allergies  Allergen Reactions   Pollen Extract Itching    Family History: Family History  Problem Relation Age of Onset   CAD Father 53       MI   Hypertension Father    Hyperlipidemia Father    Alcoholism Father    Diabetes Father    Alcohol abuse Father    Heart disease Father    Cancer Daughter        dx'ed 40 retroperitoneal liposarcoma     Social History:  reports that he quit smoking about 44 years ago. His smoking use included cigarettes. He started smoking about 54 years ago. He has a 5 pack-year smoking history. He has never used smokeless tobacco. He reports that he does not currently use alcohol after a past usage of about 4.0 standard drinks of alcohol per week. He reports that he does not use drugs.   Physical Exam: Ht 6' (1.829 m)   Wt 199 lb (90.3 kg)   BMI 26.99 kg/m   Constitutional:  Alert and oriented, No acute distress. HEENT: [place] AT, moist mucus membranes.  Trachea midline, no masses. Neurologic: Grossly intact, no focal deficits, moving all 4 extremities. Psychiatric: Normal mood and affect.   Assessment & Plan:    1. Prostate cancer  - Intermediate risk; Legrand Rams continued  active surveillance in light of medical comorbidities and recent stroke. PSA drawn today and awaiting result.  -Lengthy discussion today about risk and benefits of proceeding with intervention, further diagnostic workup versus continued surveillance.  This was shared decision making.  Also extensive review of previous records.  - Explained the PSA doubling time is more precise in predicting how the cancer would progress and his has been doubling slowly. If this increases then would consider repeat MRI at that time.   Return in about 6 months (around 02/22/2024) for PSA.  I have reviewed the above documentation for accuracy and completeness, and I agree with the above.   Vanna Scotland, MD   [facility] [address], [address] [place], [place] [address] [phone]   I spent 47 total minutes on the day of the encounter including pre-visit review of the medical record, face-to-face time with the patient, and post visit ordering of labs/imaging/tests.

## 2023-08-25 ENCOUNTER — Encounter: Payer: Self-pay | Admitting: Urology

## 2023-08-25 DIAGNOSIS — C61 Malignant neoplasm of prostate: Secondary | ICD-10-CM

## 2023-08-25 LAB — PSA: Prostate Specific Ag, Serum: 6.9 ng/mL — ABNORMAL HIGH (ref 0.0–4.0)

## 2023-08-25 NOTE — Telephone Encounter (Signed)
noted 

## 2023-08-26 ENCOUNTER — Ambulatory Visit: Payer: Medicare Other | Admitting: Physical Therapy

## 2023-08-31 ENCOUNTER — Ambulatory Visit: Payer: Medicare Other | Admitting: Physical Therapy

## 2023-09-02 ENCOUNTER — Ambulatory Visit: Payer: Medicare Other

## 2023-09-07 ENCOUNTER — Ambulatory Visit: Payer: Medicare Other | Admitting: Physical Therapy

## 2023-09-08 ENCOUNTER — Ambulatory Visit: Payer: Self-pay | Admitting: Urology

## 2023-09-09 ENCOUNTER — Ambulatory Visit: Payer: Medicare Other

## 2023-09-09 ENCOUNTER — Ambulatory Visit
Admission: RE | Admit: 2023-09-09 | Discharge: 2023-09-09 | Disposition: A | Discharge status: Home / Self Care | Payer: Medicare Other | Source: Ambulatory Visit | Attending: Orthopedic Surgery | Admitting: Orthopedic Surgery

## 2023-09-09 DIAGNOSIS — S76019A Strain of muscle, fascia and tendon of unspecified hip, initial encounter: Secondary | ICD-10-CM

## 2023-09-09 DIAGNOSIS — S7001XA Contusion of right hip, initial encounter: Secondary | ICD-10-CM

## 2023-09-09 NOTE — Progress Notes (Signed)
Carelink Summary Report / Loop Recorder 

## 2023-09-13 ENCOUNTER — Telehealth: Payer: Self-pay

## 2023-09-13 NOTE — Telephone Encounter (Signed)
Per Dr Apolinar Junes, needs an appointment with her ASAP. I called and left message to call back to schedule, need to follow up and discuss recent imaging orderd by Dr Rosita Kea

## 2023-09-13 NOTE — Telephone Encounter (Signed)
Patient advised and scheduled for 11/12 at 3 pm

## 2023-09-14 ENCOUNTER — Ambulatory Visit: Payer: Medicare Other | Admitting: Physical Therapy

## 2023-09-14 ENCOUNTER — Ambulatory Visit: Payer: Medicare Other | Admitting: Urology

## 2023-09-14 ENCOUNTER — Encounter: Payer: Self-pay | Admitting: Urology

## 2023-09-14 VITALS — BP 119/71 | HR 68 | Ht 72.0 in | Wt 199.0 lb

## 2023-09-14 DIAGNOSIS — C61 Malignant neoplasm of prostate: Secondary | ICD-10-CM | POA: Diagnosis not present

## 2023-09-14 NOTE — Progress Notes (Signed)
I,[name],acting as a scribe for Vanna Scotland, MD.,have documented all relevant documentation on the behalf of Vanna Scotland, MD,as directed by  Vanna Scotland, MD while in the presence of Vanna Scotland, MD.  09/14/2023 3:53 PM   Shannon Chung 10-Nov-1940 [id number]  Referring provider: Dana Allan, MD [address] 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  The MRI after the fall performed 09/09/2023 was read as possible metastatic bone disease. Dr. Rosita Kea reached out due to this concerning interpretation.   He states he is healing from the fall and no longer uses a walker. He is interested in starting physical therapy when Dr. Rexanne Mano orders it. He and his wife are wondering if the prostate cancer has infected his bones which may have contributed to his fall.   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 06/2015 Cardiac CT: Ca score 1103 (84th %'ile);  b. 07/2015 Cath: LM 70, LAD 80p, 100/22m, D1 70, D2 95, RI 75, RCA 100p/m;  c. 07/2015 CABG x 5 (LIMA->LAD, VG->Diag, VG->OM1->OM2, VG->OM3).   COVID-19    12/2021   COVID-19 01/18/2022   COVID-19 vaccine administered 01/18/2022   Unknown how many vaccine doses have been received. Entered from  Emergency Triage Note.   Diabetes mellitus without complication (HCC) 07/2015   Dyslipidemia    Essential hypertension    Essential hypertension 01/02/2015   Formatting of this note might be different from the original.  Last Assessment & Plan:   Chronic, stable. Continue current regimen.   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 dilated, EF 35% improved to 45-50% (2015);  b. 07/2015 EF 25-35% by LV gram.   Ischemic cardiomyopathy 01/02/2015   Kidney stones 04/17/2021   Lone atrial fibrillation (HCC) 1983   a. isolated episode, not on OAC.   Malignant neoplasm of prostate [facility]) 10/07/2021   09/2021    Medicare annual  wellness visit, subsequent 10/21/2015   Mural thrombus of cardiac apex    a. 06/2014: LV; resolved with coumadin-->no residual on f/u echo, no longer on coumadin.   Mural thrombus of heart 08/26/2021   Formatting of this note might be different from the original. Jun 16, 2019 Entered By: [name] Comment: left ventricle, cardiac apex   Osteoarthritis    a. 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 Jun 16, 2019 Entered By: [name] Comment: leftAug 19, 2020 Entered By: [name] Comment: resolved by total left knee replacement Jun 16, 2019 Entered By: [name] Comment: leftAug 19, 2020 Entered By: [name] Comment: resolved by total left knee replacement   Trigger finger of left hand 07/07/2019   Vitamin D deficiency     Surgical History: Past Surgical History:  Procedure Laterality Date   BICEPS TENDON REPAIR Right 1993   CARDIAC CATHETERIZATION N/A 07/05/2015   Procedure: Left Heart Cath and Coronary Angiography;  Surgeon: Antonieta Iba, MD;  Location: [facility] INVASIVE CV LAB;  Service: Cardiovascular;  Laterality: N/A;   CAROTID PTA/STENT INTERVENTION Left 04/26/2023   Procedure: CAROTID PTA/STENT INTERVENTION;  Surgeon: Annice Needy, MD;  Location: [facility] INVASIVE CV LAB;  Service: Cardiovascular;  Laterality: Left;   CATARACT EXTRACTION Left 12/2016   with keratoplasty   COLONOSCOPY  2007   COLONOSCOPY WITH PROPOFOL N/A 12/02/2017   TA, SSA, rpt 3 yrs(Tahiliani, Varnita B, MD)   COLONOSCOPY WITH PROPOFOL N/A 11/26/2020   Procedure: COLONOSCOPY WITH PROPOFOL;  Surgeon: Midge Minium, MD;  [facility] ENDOSCOPY;  Service: Endoscopy;  Laterality: N/A;   CORONARY ARTERY BYPASS GRAFT N/A 07/29/2015   Procedure: CORONARY ARTERY BYPASS GRAFTING (CABG) x 5 (LIMA to  LAD, SVG to DIAGONAL,  SVG SEQUENTIALLY to OM1 and OM2, SVG to OM3) with Endoscopic Vein Havesting of  GREATER SAPHENOUS VEIN from RIGHT THIGH and partial LOWER LEG ;  Surgeon: Alleen Borne, MD;  Location: [facility] OR;  Service: Open Heart Surgery;  Laterality: N/A;   EYE SURGERY     b/l cataract and cornea replaced    HAND SURGERY     left hand 1st/2nd trigger fingers Dr. [facility] ortho    JOINT REPLACEMENT     KNEE ARTHROSCOPY Left remote   MOHS SURGERY     left cheek scc 2022 Dr. Jeannine Boga   MOHS SURGERY     x 5 facial scc   right biceps tendon     repair/re attachment    SKIN CANCER EXCISION  10/2015   BCC - L ala (pending MOHs) and L scapula (complete excision)   TEE WITHOUT CARDIOVERSION N/A 07/29/2015   Procedure: TRANSESOPHAGEAL ECHOCARDIOGRAM (TEE);  Surgeon: Payton Doughty  Laneta Simmers, MD;  Location: [facility] OR;  Service: Open Heart Surgery;  Laterality: N/A;   TONSILLECTOMY  1949   TOTAL KNEE ARTHROPLASTY Left 03/18/2016   cemented L TKR; Deeann Saint, MD    Home Medications:  Allergies as of 09/14/2023       Reactions   Pollen Extract Itching        Medication List        Accurate as of September 14, 2023  3:53 PM. If you have any questions, ask your nurse or doctor.          acetaminophen 500 MG tablet Commonly known as: TYLENOL Take 500 mg by mouth every 6 (six) hours as needed for mild pain.   aspirin EC 81 MG tablet Take 162 mg by mouth daily. Swallow whole.   atorvastatin 40 MG tablet Commonly known as: LIPITOR Take 1 tablet (40 mg total) by mouth daily.   Blood Pressure Kit Check blood pressure two to three times a week   clopidogrel 75 MG tablet Commonly known as: Plavix Take 1 tablet (75 mg total) by mouth daily.   ezetimibe 10 MG tablet Commonly known as: ZETIA Take 1 tablet (10 mg total) by mouth daily.   famotidine 20 MG tablet Commonly known as: PEPCID TAKE 1 TABLET BY MOUTH 2 TIMES DAILY AS NEEDED FOR HEARTBURN/INDIGESTION. D/C ZANTAC   fluticasone 50  MCG/ACT nasal spray Commonly known as: FLONASE Place 1 spray into both nostrils daily as needed for allergies.   levETIRAcetam 500 MG tablet Commonly known as: KEPPRA Take 1 tablet (500 mg total) by mouth 2 (two) times daily.   lisinopril 10 MG tablet Commonly known as: ZESTRIL Take 1 tablet (10 mg total) by mouth daily.   metFORMIN 500 MG tablet Commonly known as: GLUCOPHAGE Take 1 tablet (500 mg total) by mouth 2 (two) times daily with a meal.   metoprolol succinate 25 MG 24 hr tablet Commonly known as: TOPROL-XL Take 1 tablet (25 mg total) by mouth daily.   prednisoLONE acetate 1 % ophthalmic suspension Commonly known as: PRED FORTE Place 1 drop into both eyes daily.   sildenafil 20 MG tablet Commonly known as: REVATIO TAKE 1 TABLET BY MOUTH DAILY AS NEEDED   vitamin B-12 500 MCG tablet Commonly known as: CYANOCOBALAMIN Take 500 mcg by mouth daily.   Vitamin D-3 125 MCG (5000 UT) Tabs Take 5,000 Units by mouth daily.        Allergies:  Allergies  Allergen Reactions   Pollen Extract Itching    Family History: Family History  Problem Relation Age of Onset   CAD Father 3       MI   Hypertension Father    Hyperlipidemia Father    Alcoholism Father    Diabetes Father    Alcohol abuse Father    Heart disease Father    Cancer Daughter        dx'ed 51 retroperitoneal liposarcoma     Social History:  reports that he quit smoking about 44 years ago. His smoking use included cigarettes. He started smoking about 54 years ago. He has a 5 pack-year smoking history. He has never used smokeless tobacco. He reports that he does not currently use alcohol after a past usage of about 4.0 standard drinks of alcohol per week. He reports that he does not use drugs.   Physical Exam: BP 119/71   Pulse 68   Ht 6' (1.829 m)   Wt 199 lb (90.3 kg)   BMI  26.99 kg/m   Constitutional:  Alert and oriented, No acute distress. HEENT: [place] AT, moist mucus membranes.  Trachea  midline, no masses. Neurologic: Grossly intact, no focal deficits, moving all 4 extremities. Psychiatric: Normal mood and affect.   Pertinent Imaging: CLINICAL DATA:  Larey Seat 1 month ago. Persistent right hip pain and difficulty walking.   EXAM: MR OF THE RIGHT HIP WITHOUT CONTRAST   TECHNIQUE: Multiplanar, multisequence MR imaging was performed. No intravenous contrast was administered.   COMPARISON:  None   FINDINGS: Abnormal marrow signal in 3 separate places in the right hemipelvis. 3 cm area of abnormal T1 and T2 signal intensity in the right iliac bone adjacent to the SI joint without obvious fracture. Second much larger area of abnormal signal intensity in the superior acetabulum with suspected small nondisplaced fractures. Third area involves the ischiopubic junction on the right side with suspected fracture on the axial sequence. Findings are worrisome for underlying bone lesions and pathologic fractures. It is possible these are all traumatic but recommend CT scan for further evaluation and correlation with PSA level with history of prior prostate cancer.   Both hips are normally located. No hip fractures are identified. No bone lesions involving the hips. Age related degenerative changes. No joint effusion.   Bilateral peritrochanteric tendinopathy no tendon rupture or bursitis. The hamstring tendons are intact. Moderate tendinopathy on the left. No muscle tears, myositis or muscle mass. Age related fatty atrophy.   Advanced degenerative changes and scoliosis noted in the lower lumbar spine.   No significant intrapelvic abnormalities are identified. No pelvic or inguinal adenopathy.   IMPRESSION: 1. Abnormal marrow signal in 3 separate places in the right hemipelvis worrisome for underlying bone lesions and pathologic fractures. It is possible these are all traumatic but recommend CT scan for further evaluation and correlation with PSA level with history of  prior prostate cancer. 2. No hip fractures are identified. Age related degenerative changes. 3. Bilateral peritrochanteric tendinopathy but no tendon rupture or bursitis. 4. Advanced degenerative changes and scoliosis in the lower lumbar spine.   Electronically Signed   By: Rudie Meyer M.D.   On: 09/09/2023 16:39 Personally reviewed the above study, discussed with Dr. Rexanne Mano, and agree with radiologic interpretation.   Assessment & Plan:    1. Prostate cancer, possible bone lesions  - Think it's very unlikely based on his relatively low PSA, low prostate cancer volume, and favorable intermediate risk disease that this is in fact metastatic prostate cancer.  -Will plan to stage him with a PET scan for completeness to rule in or out metastatic disease and follow-up to be based on this. He and his wife are in agreement with this plan.  -Case discussed with Dr. Rosita Kea  Return in about 4 weeks (around 10/12/2023) for PET scan.  I have reviewed the above documentation for accuracy and completeness, and I agree with the above.   Vanna Scotland, MD   [facility] [address], [address] [place], [place] [address] [phone]

## 2023-09-16 ENCOUNTER — Ambulatory Visit: Payer: Medicare Other

## 2023-09-21 ENCOUNTER — Ambulatory Visit: Payer: Medicare Other

## 2023-09-22 ENCOUNTER — Encounter
Admission: RE | Admit: 2023-09-22 | Discharge: 2023-09-22 | Disposition: A | Discharge status: Home / Self Care | Payer: Medicare Other | Source: Ambulatory Visit | Attending: Urology | Admitting: Urology

## 2023-09-22 DIAGNOSIS — C61 Malignant neoplasm of prostate: Secondary | ICD-10-CM | POA: Diagnosis present

## 2023-09-22 MED ORDER — FLOTUFOLASTAT F 18 GALLIUM 296-5846 MBQ/ML IV SOLN
8.0000 | Freq: Once | INTRAVENOUS | Status: AC
  Administered 2023-09-22: 8.33 via INTRAVENOUS
  Filled 2023-09-22: qty 8

## 2023-09-23 ENCOUNTER — Ambulatory Visit: Payer: Medicare Other

## 2023-09-27 ENCOUNTER — Ambulatory Visit (INDEPENDENT_AMBULATORY_CARE_PROVIDER_SITE_OTHER): Payer: Medicare Other

## 2023-09-27 DIAGNOSIS — I639 Cerebral infarction, unspecified: Secondary | ICD-10-CM

## 2023-09-27 LAB — CUP PACEART REMOTE DEVICE CHECK
Date Time Interrogation Session: 20241122230929
Implantable Pulse Generator Implant Date: 20240813

## 2023-09-28 ENCOUNTER — Other Ambulatory Visit: Payer: Self-pay

## 2023-09-28 ENCOUNTER — Ambulatory Visit: Payer: Medicare Other

## 2023-09-28 DIAGNOSIS — I152 Hypertension secondary to endocrine disorders: Secondary | ICD-10-CM

## 2023-09-28 MED ORDER — LISINOPRIL 10 MG PO TABS
10.0000 mg | ORAL_TABLET | Freq: Every day | ORAL | 3 refills | Status: DC
Start: 2023-09-28 — End: 2024-08-06

## 2023-10-05 ENCOUNTER — Ambulatory Visit: Payer: Medicare Other

## 2023-10-07 ENCOUNTER — Ambulatory Visit: Payer: Medicare Other | Admitting: Physical Therapy

## 2023-10-12 ENCOUNTER — Encounter: Payer: Self-pay | Admitting: Urology

## 2023-10-12 ENCOUNTER — Ambulatory Visit: Payer: Medicare Other

## 2023-10-12 ENCOUNTER — Ambulatory Visit: Payer: Medicare Other | Admitting: Urology

## 2023-10-12 VITALS — BP 125/70 | HR 76 | Ht 72.0 in | Wt 202.0 lb

## 2023-10-12 DIAGNOSIS — C61 Malignant neoplasm of prostate: Secondary | ICD-10-CM | POA: Diagnosis not present

## 2023-10-12 NOTE — Progress Notes (Signed)
I,[name],acting as a scribe for Vanna Scotland, MD.,have documented all relevant documentation on the behalf of Vanna Scotland, MD,as directed by  Vanna Scotland, MD while in the presence of Vanna Scotland, MD.  10/12/2023 5:05 PM   Shannon Chung [birth year] [id number]  Referring provider: Dana Allan, MD [address] [place],  [place] [address]  Chief Complaint  Patient presents with   discuss results    HPI: 82 year-old male with intermediate risk prostate cancer presents today following a PSMA PET scan.   He has a personal history of a fall, which was followed up with an MRI performed by Dr. Rosita Kea concerning for possible pathologic fracture. He underwent further evaluation with PSMA PET scan that does in fact show focal uptake within the right acetabulum which does correspond with the abnormal marrow signal in comparison to the MRI concerning for metastatic bone disease. There's also uptake in his left prostate gland. Notably his PSA is only 6.9. (Please see previous notes for details). He was formerly managed at [facility] by Dr. Mena Goes and ended up transferring here for convenience.  He said his hip is getting better and he is able to walk.   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 06/2015 Cardiac CT: Ca score 1103 (84th %'ile);  b. 07/2015 Cath: LM 70, LAD 80p, 100/22m, D1 70, D2 95, RI 75, RCA 100p/m;  c. 07/2015 CABG x 5 (LIMA->LAD, VG->Diag, VG->OM1->OM2, VG->OM3).   COVID-19    12/2021   COVID-19  01/18/2022   COVID-19 vaccine administered 01/18/2022   Unknown how many vaccine doses have been received. Entered from Emergency Triage Note.   Diabetes mellitus without complication (HCC) 07/2015   Dyslipidemia    Essential hypertension    Essential hypertension 01/02/2015   Formatting of this note might be different from the original.  Last Assessment & Plan:   Chronic, stable. Continue current regimen.   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 dilated, EF 35% improved to 45-50% (2015);  b. 07/2015 EF 25-35% by LV gram.   Ischemic cardiomyopathy 01/02/2015   Kidney stones 04/17/2021   Lone atrial fibrillation (HCC) 1983  a. isolated episode, not on OAC.   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 06/2014: LV; resolved with coumadin-->no residual on f/u echo, no longer on coumadin.   Mural thrombus of heart 08/26/2021   Formatting of this note might be different from the original. Jun 16, 2019 Entered By: [name] Comment: left ventricle, cardiac apex   Osteoarthritis    a. R-shoulder, L-knee [facility])   Personal history of  colonic polyps    Polyp of colon    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   PSA elevation 03/25/2018   Retention cyst of paranasal sinus 03/11/2022   Shoulder pain 12/23/2022   Jun 21, 2019 Entered By: [name] Comment: attributed to arthritis   Skin cancer    squamous and basal cell right forearm, SCC left cheek 10/04/20 sees derm regularly Dr. 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 Jun 16, 2019 Entered By: [name] Comment: leftAug 19, 2020 Entered By: [name] Comment: resolved by total left knee replacement Jun 16, 2019 Entered By: [name] Comment: leftAug 19, 2020 Entered By: [name] Comment: resolved by total left knee replacement   Trigger finger of left hand 07/07/2019   Vitamin D deficiency     Surgical History: Past Surgical History:  Procedure Laterality Date   BICEPS TENDON REPAIR Right 1993   CARDIAC CATHETERIZATION N/A 07/05/2015   Procedure: Left Heart Cath and Coronary Angiography;  Surgeon: Antonieta Iba, MD;  Location: [facility] INVASIVE CV LAB;  Service: Cardiovascular;  Laterality: N/A;   CAROTID PTA/STENT INTERVENTION Left 04/26/2023   Procedure: CAROTID PTA/STENT INTERVENTION;  Surgeon: Annice Needy, MD;  Location: [facility] INVASIVE CV LAB;  Service: Cardiovascular;  Laterality: Left;   CATARACT EXTRACTION Left 12/2016   with keratoplasty   COLONOSCOPY  2007   COLONOSCOPY WITH PROPOFOL N/A 12/02/2017   TA, SSA, rpt 3 yrs(Tahiliani, Varnita B, MD)   COLONOSCOPY WITH PROPOFOL N/A 11/26/2020   Procedure: COLONOSCOPY WITH PROPOFOL;  Surgeon: Midge Minium, MD;  Location[facility];  Service: Endoscopy;  Laterality:  N/A;   CORONARY ARTERY BYPASS GRAFT N/A 07/29/2015   Procedure: CORONARY ARTERY BYPASS GRAFTING (CABG) x 5 (LIMA to LAD, SVG to DIAGONAL,  SVG SEQUENTIALLY to OM1 and OM2, SVG to OM3) with Endoscopic Vein Havesting of  GREATER SAPHENOUS VEIN from RIGHT THIGH and partial LOWER LEG ;  Surgeon: Alleen Borne, MD;  Location: [facility] OR;  Service: Open Heart Surgery;  Laterality: N/A;   EYE SURGERY     b/l cataract and cornea replaced    HAND SURGERY     left hand 1st/2nd trigger fingers Dr. [facility] orth[facility]    JOINT REPLACEMENT     KNEE ARTHROSCOPY Left remote   MOHS SURGERY     left cheek scc 2022 Dr. Jeannine Boga   MOHS SURGERY     x 5 facial scc   right biceps tendon     repair/re attachment    SKIN CANCER EXCISION  10/2015   BCC - L ala (pending MOHs)  and L scapula (complete excision)   TEE WITHOUT CARDIOVERSION N/A 07/29/2015   Procedure: TRANSESOPHAGEAL ECHOCARDIOGRAM (TEE);  Surgeon: Alleen Borne, MD;  Location: [facility] OR;  Service: Open Heart Surgery;  Laterality: N/A;   TONSILLECTOMY  1949   TOTAL KNEE ARTHROPLASTY Left 03/18/2016   cemented L TKR; Deeann Saint, MD    Home Medications:  Allergies as of 10/12/2023       Reactions   Pollen Extract Itching        Medication List        Accurate as of October 12, 2023  5:05 PM. If you have any questions, ask your nurse or doctor.          acetaminophen 500 MG tablet Commonly known as: TYLENOL Take 500 mg by mouth every 6 (six) hours as needed for mild pain.   aspirin EC 81 MG tablet Take 162 mg by mouth daily. Swallow whole.   atorvastatin 40 MG tablet Commonly known as: LIPITOR Take 1 tablet (40 mg total) by mouth daily.   Blood Pressure Kit Check blood pressure two to three times a week   clopidogrel 75 MG tablet Commonly known as: Plavix Take 1 tablet (75 mg total) by mouth daily.   ezetimibe 10 MG tablet Commonly known as: ZETIA Take 1 tablet (10 mg total) by mouth daily.   famotidine 20 MG tablet Commonly known  as: PEPCID TAKE 1 TABLET BY MOUTH 2 TIMES DAILY AS NEEDED FOR HEARTBURN/INDIGESTION. D/C ZANTAC   fluticasone 50 MCG/ACT nasal spray Commonly known as: FLONASE Place 1 spray into both nostrils daily as needed for allergies.   levETIRAcetam 500 MG tablet Commonly known as: KEPPRA Take 1 tablet (500 mg total) by mouth 2 (two) times daily.   lisinopril 10 MG tablet Commonly known as: ZESTRIL Take 1 tablet (10 mg total) by mouth daily.   metFORMIN 500 MG tablet Commonly known as: GLUCOPHAGE Take 1 tablet (500 mg total) by mouth 2 (two) times daily with a meal.   metoprolol succinate 25 MG 24 hr tablet Commonly known as: TOPROL-XL Take 1 tablet (25 mg total) by mouth daily.   prednisoLONE acetate 1 % ophthalmic suspension Commonly known as: PRED FORTE Place 1 drop into both eyes daily.   sildenafil 20 MG tablet Commonly known as: REVATIO TAKE 1 TABLET BY MOUTH DAILY AS NEEDED   vitamin B-12 500 MCG tablet Commonly known as: CYANOCOBALAMIN Take 500 mcg by mouth daily.   Vitamin D-3 125 MCG (5000 UT) Tabs Take 5,000 Units by mouth daily.        Allergies:  Allergies  Allergen Reactions   Pollen Extract Itching    Family History: Family History  Problem Relation Age of Onset   CAD Father 87       MI   Hypertension Father    Hyperlipidemia Father    Alcoholism Father    Diabetes Father    Alcohol abuse Father    Heart disease Father    Cancer Daughter        dx'ed 47 retroperitoneal liposarcoma     Social History:  reports that he quit smoking about 44 years ago. His smoking use included cigarettes. He started smoking about 54 years ago. He has a 5 pack-year smoking history. He has never used smokeless tobacco. He reports that he does not currently use alcohol after a past usage of about 4.0 standard drinks of alcohol per week. He reports that he does not use drugs.   Physical Exam:  BP 125/70   Pulse 76   Ht 6' (1.829 m)   Wt 202 lb (91.6 kg)   BMI 27.40  kg/m   Constitutional:  Alert and oriented, No acute distress. HEENT: [place] AT, moist mucus membranes.  Trachea midline, no masses. Neurologic: Grossly intact, no focal deficits, moving all 4 extremities. Psychiatric: Normal mood and affect.  Pertinent Imaging: CLINICAL DATA:  High risk prostate carcinoma staging.   EXAM: NUCLEAR MEDICINE PET SKULL BASE TO THIGH   TECHNIQUE: 8.3 mCi Flotufolastat (Posluma) was injected intravenously. Full-ring PET imaging was performed from the skull base to thigh after the radiotracer. CT data was obtained and used for attenuation correction and anatomic localization.   COMPARISON:  Pelvic MRI 09/09/2023 (RIGHT hip).   FINDINGS: NECK   No radiotracer activity in neck lymph nodes.   Incidental CT finding: None.   CHEST   No radiotracer accumulation within mediastinal or hilar lymph nodes. No suspicious pulmonary nodules on the CT scan.   Incidental CT finding: Post CABG   ABDOMEN/PELVIS   Prostate: Focal uptake in the LEFT lateral lobe of the prostate gland with SUV max equal 7.6 (image 162) mild nonspecific uptake more centrally within the gland.   Lymph nodes: No abnormal radiotracer accumulation within pelvic or abdominal nodes.   Liver: No evidence of liver metastasis.   Incidental CT finding: Multiple diverticula of the descending colon and sigmoid colon without acute inflammation. Atherosclerotic calcification of the aorta.   SKELETON   There is a broad region of radiotracer uptake within the RIGHT acetabulum involving the roof and medial wall. Activity is moderate with SUV max equal 5.9. This radiotracer activity corresponds to abnormal marrow signal on comparison MRI which was indeterminate but suspicious for malignancy.   Mild radiotracer uptake (SUV max equal 3.3) in a large osteophyte complex extending leftward from the L3 vertebral body is favored benign (image 120).   IMPRESSION: 1. Focal uptake in the LEFT  lobe of the prostate gland consistent primary prostate adenocarcinoma. 2. Broad region of moderate radiotracer activity within the RIGHT acetabulum which corresponds to abnormal marrow signal on comparison MRI. 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 3. Mild uptake in the large L3 osteophyte is favored benign.   Electronically Signed   By: Genevive Bi M.D.   On: 10/10/2023 09:55 Personally reviewed scan and agree with radiologic interpretation.   Assessment & Plan:    1. Prostate cancer  - Would like to present the recent findings to the multi-disciplinary tumor board meeting next week. Uncertain if the bone marrow is bruised/ injured/ irritated from the trauma and is presenting as a false positive versus actual prostate cancer in the bone. If it is metastatic prostate cancer his PSA will continue to rise, however his hasn't at this point in time.   - He and his wife are in agreement with this. Will reach out with the results of this discussion at the beginning of next year. Also will update Dr. Rosita Kea with the PET scan results.   Return in about 4 weeks (around 11/09/2023).   [facility] [address], [address] [place], [place] [address] [phone]

## 2023-10-14 ENCOUNTER — Ambulatory Visit: Payer: Medicare Other | Admitting: Physical Therapy

## 2023-10-19 ENCOUNTER — Ambulatory Visit: Payer: Medicare Other | Admitting: Physical Therapy

## 2023-10-19 ENCOUNTER — Other Ambulatory Visit: Payer: Self-pay | Admitting: Family Medicine

## 2023-10-19 ENCOUNTER — Ambulatory Visit: Payer: Medicare Other | Admitting: Urology

## 2023-10-19 DIAGNOSIS — N529 Male erectile dysfunction, unspecified: Secondary | ICD-10-CM

## 2023-10-21 ENCOUNTER — Ambulatory Visit: Payer: Medicare Other | Admitting: Physical Therapy

## 2023-10-21 ENCOUNTER — Other Ambulatory Visit: Payer: Medicare Other

## 2023-10-21 DIAGNOSIS — M84550D Pathological fracture in neoplastic disease, pelvis, subsequent encounter for fracture with routine healing: Secondary | ICD-10-CM

## 2023-10-22 ENCOUNTER — Encounter: Payer: Self-pay | Admitting: Urology

## 2023-10-25 NOTE — Progress Notes (Signed)
Carelink Summary Report / Loop Recorder 

## 2023-11-01 ENCOUNTER — Ambulatory Visit (INDEPENDENT_AMBULATORY_CARE_PROVIDER_SITE_OTHER): Payer: Medicare Other

## 2023-11-01 DIAGNOSIS — I639 Cerebral infarction, unspecified: Secondary | ICD-10-CM | POA: Diagnosis not present

## 2023-11-09 ENCOUNTER — Ambulatory Visit
Admission: RE | Admit: 2023-11-09 | Discharge: 2023-11-09 | Disposition: A | Discharge status: Home / Self Care | Payer: Medicare Other | Source: Ambulatory Visit | Attending: Urology | Admitting: Urology

## 2023-11-09 DIAGNOSIS — M84550D Pathological fracture in neoplastic disease, pelvis, subsequent encounter for fracture with routine healing: Secondary | ICD-10-CM | POA: Diagnosis present

## 2023-11-22 ENCOUNTER — Encounter: Payer: Self-pay | Admitting: Urology

## 2023-11-22 DIAGNOSIS — M25551 Pain in right hip: Secondary | ICD-10-CM

## 2023-11-22 DIAGNOSIS — R937 Abnormal findings on diagnostic imaging of other parts of musculoskeletal system: Secondary | ICD-10-CM

## 2023-11-26 NOTE — Telephone Encounter (Signed)
Appointments scheduled and MRI order placed

## 2023-12-06 ENCOUNTER — Ambulatory Visit: Payer: Medicare Other

## 2023-12-06 DIAGNOSIS — I639 Cerebral infarction, unspecified: Secondary | ICD-10-CM | POA: Diagnosis not present

## 2023-12-06 LAB — CUP PACEART REMOTE DEVICE CHECK
Date Time Interrogation Session: 20250202232344
Implantable Pulse Generator Implant Date: 20240813

## 2023-12-09 ENCOUNTER — Ambulatory Visit: Payer: Medicare Other

## 2023-12-10 LAB — CUP PACEART REMOTE DEVICE CHECK
Date Time Interrogation Session: 20241229232204
Implantable Pulse Generator Implant Date: 20240813

## 2023-12-11 ENCOUNTER — Encounter: Payer: Self-pay | Admitting: Cardiology

## 2023-12-14 ENCOUNTER — Ambulatory Visit: Payer: Medicare Other | Attending: Orthopedic Surgery

## 2023-12-14 DIAGNOSIS — M25552 Pain in left hip: Secondary | ICD-10-CM | POA: Insufficient documentation

## 2023-12-14 DIAGNOSIS — R269 Unspecified abnormalities of gait and mobility: Secondary | ICD-10-CM | POA: Diagnosis present

## 2023-12-14 DIAGNOSIS — M545 Low back pain, unspecified: Secondary | ICD-10-CM | POA: Insufficient documentation

## 2023-12-14 DIAGNOSIS — M6281 Muscle weakness (generalized): Secondary | ICD-10-CM | POA: Insufficient documentation

## 2023-12-14 NOTE — Therapy (Signed)
OUTPATIENT PHYSICAL THERAPY LOWER EXTREMITY EVALUATION   Patient Name: Shannon Chung MRN: [id number] DOB:[birth year], 83 y.o., male Today's Date: 12/15/2023  END OF SESSION:  PT End of Session - 12/15/23 1724     Visit Number 1    Number of Visits 24    Date for PT Re-Evaluation 03/07/24    Progress Note Due on Visit 10    PT Start Time 1530    PT Stop Time 1614    PT Time Calculation (min) 44 min    Equipment Utilized During Treatment Gait belt    Activity Tolerance Patient tolerated treatment well    Behavior During Therapy WFL for tasks assessed/performed             Past Medical History:  Diagnosis Date   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 06/2015 Cardiac CT: Ca score 1103 (84th %'ile);  b. 07/2015 Cath: LM 70, LAD 80p, 100/69m, D1 70, D2 95, RI 75, RCA 100p/m;  c. 07/2015 CABG x 5 (LIMA->LAD, VG->Diag, VG->OM1->OM2, VG->OM3).   COVID-19    12/2021   COVID-19 01/18/2022   COVID-19 vaccine administered 01/18/2022   Unknown how many vaccine doses have been received. Entered from Emergency Triage Note.   Diabetes mellitus without complication (HCC) 07/2015   Dyslipidemia    Essential hypertension    Essential hypertension 01/02/2015   Formatting of this note might be different from the original.  Last Assessment & Plan:   Chronic, stable. Continue current regimen.   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 dilated, EF 35% improved to 45-50% (2015);  b. 07/2015 EF 25-35% by LV gram.   Ischemic cardiomyopathy 01/02/2015   Kidney stones 04/17/2021   Lone atrial fibrillation (HCC) 1983   a. isolated episode, not on OAC.   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 06/2014: LV; resolved with coumadin-->no residual on f/u echo, no longer on coumadin.   Mural thrombus of heart 08/26/2021   Formatting of this note might be different from the original. Jun 16, 2019 Entered By: [name] Comment: left ventricle,  cardiac apex   Osteoarthritis    a. 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 Jun 16, 2019 Entered By: [name] Comment: leftAug 19, 2020 Entered By: [name] Comment: resolved by total left knee replacement Jun 16, 2019 Entered By: [name] Comment: leftAug 19, 2020 Entered By: [name] Comment: resolved by total left knee replacement   Trigger finger of left hand 07/07/2019   Vitamin D deficiency    Past Surgical History:  Procedure Laterality Date   BICEPS TENDON REPAIR Right 1993   CARDIAC CATHETERIZATION N/A 07/05/2015   Procedure: Left Heart Cath and Coronary Angiography;  Surgeon: Antonieta Iba, MD;  Location: [facility] INVASIVE CV LAB;  Service: Cardiovascular;  Laterality: N/A;   CAROTID PTA/STENT INTERVENTION Left 04/26/2023   Procedure: CAROTID PTA/STENT INTERVENTION;  Surgeon: Annice Needy, MD;  Location: [facility] INVASIVE CV LAB;  Service: Cardiovascular;  Laterality: Left;   CATARACT EXTRACTION Left 12/2016   with keratoplasty   COLONOSCOPY  2007   COLONOSCOPY WITH PROPOFOL N/A 12/02/2017   TA, SSA, rpt 3 yrs(Tahiliani, Varnita B, MD)   COLONOSCOPY WITH PROPOFOL N/A 11/26/2020   Procedure: COLONOSCOPY WITH PROPOFOL;  Surgeon: Midge Minium, MD;  Location: [facility] ENDOSCOPY;  Service: Endoscopy;  Laterality: N/A;   CORONARY ARTERY BYPASS GRAFT N/A 07/29/2015   Procedure: CORONARY ARTERY BYPASS GRAFTING (CABG) x 5 (LIMA to LAD, SVG to DIAGONAL,  SVG SEQUENTIALLY to OM1 and OM2, SVG to OM3) with Endoscopic Vein Havesting of  GREATER SAPHENOUS VEIN from RIGHT THIGH and partial LOWER LEG ;  Surgeon: Alleen Borne, MD;  Location: [facility] OR;  Service: Open Heart Surgery;  Laterality: N/A;   EYE SURGERY     b/l cataract and cornea replaced    HAND SURGERY     left hand 1st/2nd trigger fingers Dr. Hyacinth Meeker ortho    JOINT REPLACEMENT     KNEE ARTHROSCOPY Left remote   MOHS SURGERY     left cheek scc 2022 Dr. Jeannine Boga   MOHS  SURGERY     x 5 facial scc   right biceps tendon     repair/re attachment    SKIN CANCER EXCISION  10/2015   BCC - L ala (pending MOHs) and L scapula (complete excision)   TEE WITHOUT CARDIOVERSION N/A 07/29/2015   Procedure: TRANSESOPHAGEAL ECHOCARDIOGRAM (TEE);  Surgeon: Alleen Borne, MD;  Location: [facility] OR;  Service: Open Heart Surgery;  Laterality: N/A;   TONSILLECTOMY  1949   TOTAL KNEE ARTHROPLASTY Left 03/18/2016   cemented L TKR; Deeann Saint, MD   Patient Active Problem List   Diagnosis Date Noted   Annual physical exam 07/10/2023   TIA (transient ischemic attack) 05/02/2023  GERD without esophagitis 05/02/2023   Carotid stenosis, symptomatic, with infarction (HCC) 04/26/2023   History of CVA (cerebrovascular accident) without residual deficits 03/04/2023   Acute CVA (cerebrovascular accident) (HCC) 02/22/2023   Chronic radicular pain of lower back 08/10/2022   Cervical spondylosis 03/11/2022   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   Bilateral carotid artery stenosis 03/11/2022   Lumbar spondylosis 10/07/2021   DDD (degenerative disc disease), lumbar 10/07/2021   History of radiation therapy 08/26/2021   Aortic atherosclerosis (HCC) 04/17/2021   Diverticulosis 04/17/2021   Hypertension associated with diabetes (HCC) 10/07/2020   Overweight (BMI 25.0-29.9) 10/07/2020   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   Vitamin D deficiency 08/01/2020   Polyp of sigmoid colon 08/01/2020   Insomnia 06/07/2020   Gastroesophageal reflux disease 04/04/2020   Degenerative joint disease of hand 03/01/2020   BPH (benign prostatic hyperplasia) 08/05/2018   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   Macular scar of both eyes 04/20/2018   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   Scotoma involving central area of both eyes 04/20/2018   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   Fatty liver 03/25/2018   Erectile dysfunction 02/23/2017   Hx of CABG 01/24/2017   Advanced care planning/counseling  discussion 10/21/2015   Coronary artery disease of native artery of native heart with stable angina pectoris (HCC)    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   Dyslipidemia 01/02/2015   Osteoarthritis 01/02/2015   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/02/2014    PCP: Dana Allan, MD  REFERRING PROVIDER: Kennedy Bucker, MD  REFERRING DIAG:  S76.019A (ICD-10-CM) - Rupture of hip abductor tendon  S70.01XA (ICD-10-CM) - Contusion of right hip    THERAPY DIAG:  Abnormality of gait and mobility - Plan: PT plan of care cert/re-cert  Muscle weakness (generalized) - Plan: PT plan of care cert/re-cert  Rationale for Evaluation and Treatment: Rehabilitation  ONSET DATE: 08/09/2023  SUBJECTIVE:   SUBJECTIVE STATEMENT: Fall at dtr house at steps on 08/09/2023- Contusion of right hip and rupture of hip abductor tendon. Conservative treatment and patient reports doing better. States leg is weak with some difficulty walking yet no pain.   PERTINENT HISTORY: The patient sustained a fall while visiting family in [place] back in Oct 2024 and pain has improved but reports still feeling week.    PAIN:  Are you having pain? No  PRECAUTIONS: Fall  RED FLAGS: None   WEIGHT BEARING RESTRICTIONS: No  FALLS:  Has patient fallen in last 6 months? Yes. 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  DIAGNOSTIC FINDINGS:  Narrative & Impression  CLINICAL DATA:  Larey Seat 1 month ago. Persistent right hip pain and difficulty walking.   EXAM: MR OF THE RIGHT  HIP WITHOUT CONTRAST   TECHNIQUE: Multiplanar, multisequence MR imaging was performed. No intravenous contrast was administered.   COMPARISON:  None   FINDINGS: Abnormal marrow signal in 3 separate places in the right hemipelvis. 3 cm area of abnormal T1 and T2 signal intensity in the right iliac bone adjacent to the SI joint without obvious fracture. Second much larger area of abnormal signal intensity  in the superior acetabulum with suspected small nondisplaced fractures. Third area involves the ischiopubic junction on the right side with suspected fracture on the axial sequence. Findings are worrisome for underlying bone lesions and pathologic fractures. It is possible these are all traumatic but recommend CT scan for further evaluation and correlation with PSA level with history of prior prostate cancer.   Both hips are normally located. No hip fractures are identified. No bone lesions involving the hips. Age related degenerative changes. No joint effusion.   Bilateral peritrochanteric tendinopathy no tendon rupture or bursitis. The hamstring tendons are intact. Moderate tendinopathy on the left. No muscle tears, myositis or muscle mass. Age related fatty atrophy.   Advanced degenerative changes and scoliosis noted in the lower lumbar spine.   No significant intrapelvic abnormalities are identified. No pelvic or inguinal adenopathy.   IMPRESSION: 1. Abnormal marrow signal in 3 separate places in the right hemipelvis worrisome for underlying bone lesions and pathologic fractures. It is possible these are all traumatic but recommend CT scan for further evaluation and correlation with PSA level with history of prior prostate cancer. 2. No hip fractures are identified. Age related degenerative changes. 3. Bilateral peritrochanteric tendinopathy but no tendon rupture or bursitis. 4. Advanced degenerative changes and scoliosis in the lower lumbar spine.      Electronically Signed   By: Rudie Meyer M.D.   On: 09/09/2023 16:39    PATIENT SURVEYS:  LEFS 57/80  COGNITION: Overall cognitive status: Within functional limits for tasks assessed     SENSATION: WFL  EDEMA:  None observed   PALPATION: No tenderness noted along lateral right hip  LOWER EXTREMITY ROM:  Active ROM Right eval Left eval  Hip flexion    Hip extension    Hip abduction    Hip adduction    Hip internal rotation    Hip external rotation    Knee flexion    Knee extension    Ankle dorsiflexion    Ankle plantarflexion    Ankle inversion    Ankle eversion     (Blank rows = not tested)  LOWER EXTREMITY MMT:  MMT Right eval Left eval  Hip flexion 4 5  Hip extension 4 5  Hip abduction 2+ 5  Hip adduction 5 5  Hip internal rotation 3+ 5  Hip external rotation 3+ 5  Knee flexion 5 5  Knee extension 5 5  Ankle dorsiflexion 5 5  Ankle plantarflexion    Ankle inversion    Ankle eversion     (Blank rows = not tested)  LOWER EXTREMITY SPECIAL TESTS:  Hip special tests: Trendelenburg test: positive , Thomas test: negative, and Ober's test: negative  FUNCTIONAL TESTS:  5 times sit to stand: 20.01 sec without UE support  10 meter walk test: 0.74 m/s Berg Balance Scale: To be assessed next visit  GAIT: Distance walked: approx 100 feet Assistive device utilized: None Level of assistance: SBA Comments: (+) trendelenburg and shuffling gait with decreased foot clearance  TREATMENT DATE: 12/14/2023 PT EVALUATION and Hip treatment- See HEP for instruction    PATIENT EDUCATION:  Education details: Purpose of PT; Anatomy of Hip; HEP Person educated: Patient Education method: Explanation, Demonstration, Tactile cues, Verbal cues, and Handouts Education comprehension: verbalized understanding, returned demonstration, verbal  cues required, tactile cues required, and needs further education  HOME EXERCISE PROGRAM: Access Code: [email or web address] URL: [email or web address] Date: 12/14/2023 Prepared by: Maureen Ralphs  Exercises - Sidelying Hip Abduction  - 3 x weekly - 3 sets - 10 reps - 2 sec hold - Clamshell with Resistance  - 3 x weekly - 3 sets - 10 reps  ASSESSMENT:  CLINICAL IMPRESSION: Patient is a 83 y.o. male who was seen today for physical therapy evaluation and treatment for Right hip abductor rupture and contusion of right hip. He presents with no pain but some Right hip muscle weakness and impaired functional mobility. Will further assess balance next visit.  Pt will benefit from PT services to address deficits in strength and mobility in order to return to full function at home with decreased risk of falling.    OBJECTIVE IMPAIRMENTS: Abnormal gait, decreased activity tolerance, decreased balance, decreased coordination, decreased endurance, decreased knowledge of condition, decreased mobility, difficulty walking, decreased strength, and hypomobility.   ACTIVITY LIMITATIONS: lifting, bending, standing, squatting, stairs, and transfers  PARTICIPATION LIMITATIONS: cleaning, laundry, shopping, community activity, occupation, and yard work  PERSONAL FACTORS: Age and 1-2 comorbidities: CVA, Prostate CA  are also affecting patient's functional outcome.   REHAB POTENTIAL: Good  CLINICAL DECISION MAKING: Evolving/moderate complexity  EVALUATION COMPLEXITY: Moderate   GOALS: Goals reviewed with patient? Yes  SHORT TERM GOALS: Target date: 01/25/2024 Pt will be independent with HEP in order to  increase strength/balance in order to improvefunction at home and work. Baseline: EVAL- No formal HEP in place Goal status: INITIAL   LONG TERM GOALS: Target date: 03/07/2024  Pt will increase LEFS by at least 9 points in order to demonstrate significant improvement in lower extremity function.    Baseline: EVAL= 57/80 Goal status: INITIAL  2.  Patient will demonstrate improved right Hip Abd strength as seen by ability to achieve Full ROM in sidelye position Baseline: EVAL- Minimal able to raise R LE in sidelye Goal status: INITIAL  3.  Patient (> 42 years old) will complete five times sit to stand test in < 15 seconds indicating an increased LE strength and improved balance. Baseline: EVAL= 20.01 sec without UE support Goal status: INITIAL    4.  Patient will increase Berg Balance score by > 6 points to demonstrate decreased fall risk during functional activities. Baseline: EVAL: to be assessed visit #2 Goal status: INITIAL    5.   Patient will increase 10 meter walk test to >1.59m/s as to improve gait speed for better community ambulation and to reduce fall risk. Baseline: EVAL= 0.74 Goal status: INITIAL  6.   Patient will increase six minute walk test distance to >1000 for progression to community ambulator and improve gait ability Baseline: EVAL -to be assessed 2nd visit Goal status: INITIAL   PLAN:  PT FREQUENCY: 1-2x/week  PT DURATION: 12 weeks  PLANNED INTERVENTIONS: [id number]- PT Re-evaluation, 97110-Therapeutic exercises, 97530- Therapeutic activity, 97112- Neuromuscular re-education, 97535- Self Care, 11914- Manual therapy, [id number]- Gait training, [phone]- Orthotic Fit/training, [phone]- Electrical stimulation (manual), Patient/Family education, Balance training, Stair training, Taping, Dry Needling, Joint mobilization, Joint manipulation, Spinal manipulation, Spinal mobilization, DME instructions, Cryotherapy, and Moist heat  PLAN FOR NEXT SESSION: BERG test, 6  min walk, continue with progressive therex for Hip   Lenda Kelp, PT 12/15/2023, 6:09 PM

## 2023-12-16 ENCOUNTER — Ambulatory Visit: Payer: Medicare Other

## 2023-12-16 DIAGNOSIS — M25552 Pain in left hip: Secondary | ICD-10-CM

## 2023-12-16 DIAGNOSIS — R269 Unspecified abnormalities of gait and mobility: Secondary | ICD-10-CM | POA: Diagnosis not present

## 2023-12-16 DIAGNOSIS — M545 Low back pain, unspecified: Secondary | ICD-10-CM

## 2023-12-16 DIAGNOSIS — M6281 Muscle weakness (generalized): Secondary | ICD-10-CM

## 2023-12-16 NOTE — Therapy (Signed)
OUTPATIENT PHYSICAL THERAPY LOWER EXTREMITY TREATMENT   Patient Name: Shannon Chung MRN: [id number] DOB:[birth year], 83 y.o., male Today's Date: 12/19/2023  END OF SESSION:  PT End of Session - 12/19/23 1105     Visit Number 2    Number of Visits 24    Date for PT Re-Evaluation 03/07/24    Progress Note Due on Visit 10    PT Start Time 1447    PT Stop Time 1528    PT Time Calculation (min) 41 min    Equipment Utilized During Treatment Gait belt    Activity Tolerance Patient tolerated treatment well    Behavior During Therapy WFL for tasks assessed/performed             Past Medical History:  Diagnosis Date   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 06/2015 Cardiac CT: Ca score 1103 (84th %'ile);  b. 07/2015 Cath: LM 70, LAD 80p, 100/39m, D1 70, D2 95, RI 75, RCA 100p/m;  c. 07/2015 CABG x 5 (LIMA->LAD, VG->Diag, VG->OM1->OM2, VG->OM3).   COVID-19    12/2021   COVID-19 01/18/2022   COVID-19 vaccine administered 01/18/2022   Unknown how many vaccine doses have been received. Entered from Emergency Triage Note.   Diabetes mellitus without complication (HCC) 07/2015   Dyslipidemia    Essential hypertension    Essential hypertension 01/02/2015   Formatting of this note might be different from the original.  Last Assessment & Plan:   Chronic, stable. Continue current regimen.   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 dilated, EF 35% improved to 45-50% (2015);  b. 07/2015 EF 25-35% by LV gram.   Ischemic cardiomyopathy 01/02/2015   Kidney stones 04/17/2021   Lone atrial fibrillation (HCC) 1983   a. isolated episode, not on OAC.   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 06/2014: LV; resolved with coumadin-->no residual on f/u echo, no longer on coumadin.   Mural thrombus of heart 08/26/2021   Formatting of this note might be different from the original. Jun 16, 2019 Entered By: [name] Comment: left ventricle,  cardiac apex   Osteoarthritis    a. 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 Jun 16, 2019 Entered By: [name] Comment: leftAug 19, 2020 Entered By: [name] Comment: resolved by total left knee replacement Jun 16, 2019 Entered By: [name] Comment: leftAug 19, 2020 Entered By: [name] Comment: resolved by total left knee replacement   Trigger finger of left hand 07/07/2019   Vitamin D deficiency    Past Surgical History:  Procedure Laterality Date   BICEPS TENDON REPAIR Right 1993   CARDIAC CATHETERIZATION N/A 07/05/2015   Procedure: Left Heart Cath and Coronary Angiography;  Surgeon: Antonieta Iba, MD;  Location: [facility] INVASIVE CV LAB;  Service: Cardiovascular;  Laterality: N/A;   CAROTID PTA/STENT INTERVENTION Left 04/26/2023   Procedure: CAROTID PTA/STENT INTERVENTION;  Surgeon: Annice Needy, MD;  Location: [facility] INVASIVE CV LAB;  Service: Cardiovascular;  Laterality: Left;   CATARACT EXTRACTION Left 12/2016   with keratoplasty   COLONOSCOPY  2007   COLONOSCOPY WITH PROPOFOL N/A 12/02/2017   TA, SSA, rpt 3 yrs(Tahiliani, Varnita B, MD)   COLONOSCOPY WITH PROPOFOL N/A 11/26/2020   Procedure: COLONOSCOPY WITH PROPOFOL;  Surgeon: Midge Minium, MD;  Location: [facility] ENDOSCOPY;  Service: Endoscopy;  Laterality: N/A;   CORONARY ARTERY BYPASS GRAFT N/A 07/29/2015   Procedure: CORONARY ARTERY BYPASS GRAFTING (CABG) x 5 (LIMA to LAD, SVG to DIAGONAL,  SVG SEQUENTIALLY to OM1 and OM2, SVG to OM3) with Endoscopic Vein Havesting of  GREATER SAPHENOUS VEIN from RIGHT THIGH and partial LOWER LEG ;  Surgeon: Alleen Borne, MD;  Location: [facility] OR;  Service: Open Heart Surgery;  Laterality: N/A;   EYE SURGERY     b/l cataract and cornea replaced    HAND SURGERY     left hand 1st/2nd trigger fingers Dr. Hyacinth Meeker ortho    JOINT REPLACEMENT     KNEE ARTHROSCOPY Left remote   MOHS SURGERY     left cheek scc 2022 Dr. Jeannine Boga   MOHS  SURGERY     x 5 facial scc   right biceps tendon     repair/re attachment    SKIN CANCER EXCISION  10/2015   BCC - L ala (pending MOHs) and L scapula (complete excision)   TEE WITHOUT CARDIOVERSION N/A 07/29/2015   Procedure: TRANSESOPHAGEAL ECHOCARDIOGRAM (TEE);  Surgeon: Alleen Borne, MD;  Location[facility] OR;  Service: Open Heart Surgery;  Laterality: N/A;   TONSILLECTOMY  1949   TOTAL KNEE ARTHROPLASTY Left 03/18/2016   cemented L TKR; Deeann Saint, MD   Patient Active Problem List   Diagnosis Date Noted   Annual physical exam 07/10/2023   TIA (transient ischemic attack) 05/02/2023  GERD without esophagitis 05/02/2023   Carotid stenosis, symptomatic, with infarction (HCC) 04/26/2023   History of CVA (cerebrovascular accident) without residual deficits 03/04/2023   Acute CVA (cerebrovascular accident) (HCC) 02/22/2023   Chronic radicular pain of lower back 08/10/2022   Cervical spondylosis 03/11/2022   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   Bilateral carotid artery stenosis 03/11/2022   Lumbar spondylosis 10/07/2021   DDD (degenerative disc disease), lumbar 10/07/2021   History of radiation therapy 08/26/2021   Aortic atherosclerosis (HCC) 04/17/2021   Diverticulosis 04/17/2021   Hypertension associated with diabetes (HCC) 10/07/2020   Overweight (BMI 25.0-29.9) 10/07/2020   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   Vitamin D deficiency 08/01/2020   Polyp of sigmoid colon 08/01/2020   Insomnia 06/07/2020   Gastroesophageal reflux disease 04/04/2020   Degenerative joint disease of hand 03/01/2020   BPH (benign prostatic hyperplasia) 08/05/2018   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   Macular scar of both eyes 04/20/2018   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   Scotoma involving central area of both eyes 04/20/2018   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   Fatty liver 03/25/2018   Erectile dysfunction 02/23/2017   Hx of CABG 01/24/2017   Advanced care planning/counseling  discussion 10/21/2015   Coronary artery disease of native artery of native heart with stable angina pectoris (HCC)    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   Dyslipidemia 01/02/2015   Osteoarthritis 01/02/2015   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/02/2014    PCP: Dana Allan, MD  REFERRING PROVIDER: Kennedy Bucker, MD  REFERRING DIAG:  S76.019A (ICD-10-CM) - Rupture of hip abductor tendon  S70.01XA (ICD-10-CM) - Contusion of right hip    THERAPY DIAG:  Abnormality of gait and mobility  Muscle weakness (generalized)  Bilateral low back pain without sciatica, unspecified chronicity  Pain in left hip  Rationale for Evaluation and Treatment: Rehabilitation  ONSET DATE: 08/09/2023  SUBJECTIVE:   SUBJECTIVE STATEMENT: I tried the exercises and did pretty well - didn't have any pain.   From Eval:  Fall at [place] house [place] on 08[place]09/2023- Contusion of right hip and rupture of hip abductor tendon. Conservative treatment and patient reports doing better. States leg is weak with some difficulty walking yet no pain.   PERTINENT HISTORY: The patient sustained a fall while visiting family in [place] back in Oct 2024 and pain has improved but reports still feeling week.    PAIN:  Are you having pain? No  PRECAUTIONS: Fall  RED FLAGS: None   WEIGHT BEARING RESTRICTIONS: No  FALLS:  Has patient fallen in last 6 months? Yes. 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  DIAGNOSTIC FINDINGS:  Narrative & Impression  CLINICAL DATA:   Larey Seat 1 month ago. Persistent right hip pain and difficulty walking.   EXAM: MR OF THE RIGHT HIP WITHOUT CONTRAST   TECHNIQUE: Multiplanar, multisequence MR imaging was performed. No intravenous contrast was administered.   COMPARISON:  None   FINDINGS: Abnormal marrow signal in 3 separate places in the right hemipelvis. 3 cm area of abnormal T1 and T2 signal intensity in the right  iliac bone adjacent to the SI joint without obvious fracture. Second much larger area of abnormal signal intensity in the superior acetabulum with suspected small nondisplaced fractures. Third area involves the ischiopubic junction on the right side with suspected fracture on the axial sequence. Findings are worrisome for underlying bone lesions and pathologic fractures. It is possible these are all traumatic but recommend CT scan for further evaluation and correlation with PSA level with history of prior prostate cancer.   Both hips are normally located. No hip fractures are identified. No bone lesions involving the hips. Age related degenerative changes. No joint effusion.   Bilateral peritrochanteric tendinopathy no tendon rupture or bursitis. The hamstring tendons are intact. Moderate tendinopathy on the left. No muscle tears, myositis or muscle mass. Age related fatty atrophy.   Advanced degenerative changes and scoliosis noted in the lower lumbar spine.   No significant intrapelvic abnormalities are identified. No pelvic or inguinal adenopathy.   IMPRESSION: 1. Abnormal marrow signal in 3 separate places in the right hemipelvis worrisome for underlying bone lesions and pathologic fractures. It is possible these are all traumatic but recommend CT scan for further evaluation and correlation with PSA level with history of prior prostate cancer. 2. No hip fractures are identified. Age related degenerative changes. 3. Bilateral peritrochanteric tendinopathy but no tendon rupture  or bursitis. 4. Advanced degenerative changes and scoliosis in the lower lumbar spine.     Electronically Signed   By: Rudie Meyer M.D.   On: 09/09/2023 16:39    PATIENT SURVEYS:  LEFS 57/80  COGNITION: Overall cognitive status: Within functional limits for tasks assessed     SENSATION: WFL  EDEMA:  None observed   PALPATION: No tenderness noted along lateral right hip  LOWER EXTREMITY ROM:  Active ROM Right eval Left eval  Hip flexion    Hip extension    Hip abduction    Hip adduction    Hip internal rotation    Hip external rotation    Knee flexion    Knee extension    Ankle dorsiflexion    Ankle plantarflexion    Ankle inversion    Ankle eversion     (Blank rows = not tested)  LOWER EXTREMITY MMT:  MMT Right eval Left eval  Hip flexion 4 5  Hip extension 4 5  Hip abduction 2+ 5  Hip adduction 5 5  Hip internal rotation 3+ 5  Hip external rotation 3+ 5  Knee flexion 5 5  Knee extension 5 5  Ankle dorsiflexion 5 5  Ankle plantarflexion    Ankle inversion    Ankle eversion     (Blank rows = not tested)  LOWER EXTREMITY SPECIAL TESTS:  Hip special tests: Trendelenburg test: positive , Thomas test: negative, and Ober's test: negative  FUNCTIONAL TESTS:  5 times sit to stand: 20.01 sec without UE support  10 meter walk test: 0.74 m/s Berg Balance Scale: To be assessed next visit  GAIT: Distance walked: approx 100 feet Assistive device utilized: None Level of assistance: SBA Comments: (+) trendelenburg and shuffling gait with decreased foot clearance  TREATMENT DATE: 12/14/2023  Physical Performance testing:   BERG  -43/56      6 Min Walk Test:  Instructed patient to ambulate as quickly and as safely as possible for 6 minutes using LRAD. Patient was allowed to take standing rest breaks without stopping the  test, but if the patient required a sitting rest break the clock would be stopped and the test would be over.  Results: 765 feet (213 meters) using no device with Supervision. Results indicate that the patient has reduced endurance with ambulation compared to age matched norms.  Age Matched Norms: 63-89 yo M: 417  MDC: 58.21 meters (190.98 feet) or 50 meters (ANPTA Core Set of Outcome Measures for Adults with Neurologic Conditions, 2018)   Self care/Home management: Verbal review of previous HEP provided last session   PATIENT EDUCATION:  Education details: Purpose of PT; Anatomy of Hip; HEP Person educated: Patient Education method: Explanation, Demonstration, Tactile cues, Verbal cues, and Handouts Education comprehension: verbalized understanding, returned demonstration, verbal cues required, tactile cues required, and needs further education  HOME EXERCISE PROGRAM: Access Code: [email or web address] URL: [email or web address] Date: 12/14/2023 Prepared by: Maureen Ralphs  Exercises - Sidelying Hip Abduction  - 3 x weekly - 3 sets - 10 reps - 2 sec hold - Clamshell with Resistance  - 3 x weekly - 3 sets - 10 reps  ASSESSMENT:  CLINICAL IMPRESSION: Treatment continued today with further evaluation into balance revealing deficits and increased risk of falling. Patient also presented with decreased functional mobility with some obvious gait abnormalities observed during 6 min walk test- decreased step length/stride, Wide base of support and unsteadiness.  Pt will benefit from PT services to address deficits in strength, balance, and mobility in order to return to full function at home with decreased risk of falling.    OBJECTIVE IMPAIRMENTS: Abnormal gait, decreased activity tolerance, decreased balance, decreased coordination, decreased endurance, decreased knowledge of condition, decreased mobility, difficulty walking, decreased strength, and hypomobility.   ACTIVITY  LIMITATIONS: lifting, bending, standing, squatting, stairs, and transfers  PARTICIPATION LIMITATIONS: cleaning, laundry, shopping, community activity, occupation, and yard work  PERSONAL FACTORS: Age and 1-2 comorbidities: CVA, Prostate CA  are also affecting patient's functional outcome.   REHAB POTENTIAL: Good  CLINICAL DECISION MAKING: Evolving/moderate complexity  EVALUATION COMPLEXITY: Moderate   GOALS: Goals reviewed with patient? Yes  SHORT TERM GOALS: Target date: 01/25/2024 Pt will be independent with HEP in order to  increase strength/balance in order to improvefunction at home and work. Baseline: EVAL- No formal HEP in place Goal status: INITIAL   LONG TERM GOALS: Target date: 03/07/2024  Pt will increase LEFS by at least 9 points in order to demonstrate significant improvement in lower extremity function.   Baseline: EVAL= 57/80 Goal status: INITIAL  2.  Patient will demonstrate improved right Hip Abd strength as seen by ability to achieve Full ROM in sidelye position Baseline: EVAL- Minimal able to raise R LE in sidelye Goal status: INITIAL  3.  Patient (> 48 years old) will complete five times sit to stand test in < 15 seconds indicating an increased LE strength and improved balance. Baseline: EVAL= 20.01 sec without UE support Goal status: INITIAL    4.  Patient will increase Berg Balance score by > 6 points to demonstrate decreased fall risk during functional activities. Baseline: EVAL: to be assessed visit #2; 12/16/2023= 43/56 Goal status: INITIAL    5.   Patient will increase 10 meter walk test to >1.69m/s as to improve  gait speed for better community ambulation and to reduce fall risk. Baseline: EVAL= 0.74 Goal status: INITIAL  6.   Patient will increase six minute walk test distance to >1000 for progression to community ambulator and improve gait ability Baseline: EVAL -to be assessed visit #2; 12/16/2023= 765 feet Goal status: INITIAL   PLAN:  PT  FREQUENCY: 1-2x/week  PT DURATION: 12 weeks  PLANNED INTERVENTIONS: 97164- PT Re-evaluation, 97110-Therapeutic exercises, 97530- Therapeutic activity, 97112- Neuromuscular re-education, 97535- Self Care, 54098- Manual therapy, [phone]- Gait training, [phone]- Orthotic Fit/training, [phone]- Electrical stimulation (manual), Patient/Family education, Balance training, Stair training, Taping, Dry Needling, Joint mobilization, Joint manipulation, Spinal manipulation, Spinal mobilization, DME instructions, Cryotherapy, and Moist heat  PLAN FOR NEXT SESSION: BERG test, 6 min walk, continue with progressive therex for Hip   Lenda Kelp, PT 12/19/2023, 11:22 AM

## 2023-12-20 ENCOUNTER — Ambulatory Visit: Payer: Medicare Other | Admitting: Diagnostic Neuroimaging

## 2023-12-20 ENCOUNTER — Encounter: Payer: Self-pay | Admitting: Diagnostic Neuroimaging

## 2023-12-20 VITALS — BP 138/62 | HR 76 | Ht 72.0 in | Wt 205.0 lb

## 2023-12-20 DIAGNOSIS — G40909 Epilepsy, unspecified, not intractable, without status epilepticus: Secondary | ICD-10-CM | POA: Diagnosis not present

## 2023-12-20 MED ORDER — LEVETIRACETAM 500 MG PO TABS
500.0000 mg | ORAL_TABLET | Freq: Two times a day (BID) | ORAL | 4 refills | Status: AC
Start: 2023-12-20 — End: 2025-03-14

## 2023-12-20 NOTE — Progress Notes (Addendum)
[facility]  PATIENT: Shannon Chung DOB: Nov 14, 1940  REFERRING CLINICIAN: Dana Allan, MD HISTORY FROM: patient and friend REASON FOR VISIT: follow up   HISTORICAL  CHIEF COMPLAINT:  Chief Complaint  Patient presents with   Seizures    Rm 6 with friend Shannon Chung  Pt is well and stable, reports no new stroke or sz concerns since last visit.     HISTORY OF PRESENT ILLNESS:   UPDATE (12/20/23, VRP): Since last visit, doing well. Symptoms are stable, no recurrent spells. No alleviating or aggravating factors. Tolerating LEV.    UPDATE (06/17/23, VRP): Since last visit, in April 2024 had right arm and right leg weakness, went to the hospital for stroke evaluation and received tenecteplase.  No acute infarct was found on MRI.  In 04/26/23 patient underwent left carotid revascularization, and postoperatively had several episodes of speech difficulty and aphasia that were transient.  Patient was managed medically.  Patient returned to ER on 04/28/2023 for recurrent symptoms.  Returned on 05/02/2023 for recurrent symptoms of aphasia.  This time EEG showed some cortical dysfunction left temporal region and therefore patient was started empirically on levetiracetam 500 mg twice a day.  Since that time patient is doing well.  No further events.  PRIOR HPI (04/15/22): 83 year old male here for evaluation of syncope and abnormal CT head.  01/18/22 patient was in [place] at a motel, feeling under the weather with cold-like symptoms.  He took some allergy medications.  He came out of the hotel room and apparently tripped and fell down.  Unclear whether he passed out or tripped and was not conscious.  Patient went to hospital for evaluation.  He was diagnosed with COVID and evaluated for syncope.  CT of the head showed atrophy and some chronic infarcts.  Patient has never had unilateral numbness, weakness, slurred speech, vision loss or other stroke symptoms in the past.  Since that  time patient is back to baseline.  He did note some intermittent numbness and pulling sensation in his right jaw and neck with certain head and neck positions.   REVIEW OF SYSTEMS: Full 14 system review of systems performed and negative with exception of: as per HPI.  ALLERGIES: Allergies  Allergen Reactions   Pollen Extract Itching    HOME MEDICATIONS: Outpatient Medications Prior to Visit  Medication Sig Dispense Refill   acetaminophen (TYLENOL) 500 MG tablet Take 500 mg by mouth every 6 (six) hours as needed for mild pain.      aspirin 81 MG EC tablet Take 162 mg by mouth daily. Swallow whole.     atorvastatin (LIPITOR) 40 MG tablet Take 1 tablet (40 mg total) by mouth daily.     Blood Pressure KIT Check blood pressure two to three times a week 1 kit 0   Cholecalciferol (VITAMIN D-3) 125 MCG (5000 UT) TABS Take 5,000 Units by mouth daily.     clopidogrel (PLAVIX) 75 MG tablet Take 1 tablet (75 mg total) by mouth daily. 30 tablet 0   ezetimibe (ZETIA) 10 MG tablet Take 1 tablet (10 mg total) by mouth daily. 90 tablet 3   famotidine (PEPCID) 20 MG tablet TAKE 1 TABLET BY MOUTH 2 TIMES DAILY AS NEEDED FOR HEARTBURN/INDIGESTION. D/C ZANTAC 180 tablet 3   fluticasone (FLONASE) 50 MCG/ACT nasal spray Place 1 spray into both nostrils daily as needed for allergies.     levETIRAcetam (KEPPRA) 500 MG tablet Take 1 tablet (500 mg total) by mouth 2 (two) times daily.  180 tablet 4   lisinopril (ZESTRIL) 10 MG tablet Take 1 tablet (10 mg total) by mouth daily. 90 tablet 3   metFORMIN (GLUCOPHAGE) 500 MG tablet Take 1 tablet (500 mg total) by mouth 2 (two) times daily with a meal. 180 tablet 3   metoprolol succinate (TOPROL-XL) 25 MG 24 hr tablet Take 1 tablet (25 mg total) by mouth daily. 90 tablet 2   prednisoLONE acetate (PRED FORTE) 1 % ophthalmic suspension Place 1 drop into both eyes daily.     sildenafil (REVATIO) 20 MG tablet TAKE 1 TABLET BY MOUTH DAILY AS NEEDED 30 tablet 0   vitamin B-12  (CYANOCOBALAMIN) 500 MCG tablet Take 500 mcg by mouth daily.     No facility-administered medications prior to visit.    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 06/2015 Cardiac CT: Ca score 1103 (84th %'ile);  b. 07/2015 Cath: LM 70, LAD 80p, 100/64m, D1 70, D2 95, RI 75, RCA 100p/m;  c. 07/2015 CABG x 5 (LIMA->LAD, VG->Diag, VG->OM1->OM2, VG->OM3).   COVID-19    12/2021   COVID-19 01/18/2022   COVID-19 vaccine administered 01/18/2022   Unknown how many vaccine doses have been received. Entered from Emergency Triage Note.   Diabetes mellitus without complication (HCC) 07/2015   Dyslipidemia    Essential hypertension    Essential hypertension 01/02/2015   Formatting of this note might be different from the original.  Last Assessment & Plan:   Chronic, stable. Continue current regimen.   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 dilated, EF 35% improved to 45-50% (2015);  b. 07/2015 EF 25-35% by LV gram.   Ischemic cardiomyopathy 01/02/2015   Kidney stones 04/17/2021   Lone atrial fibrillation (HCC) 1983   a. isolated episode, not on OAC.   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 06/2014: LV; resolved with coumadin-->no residual on f/u echo, no longer on coumadin.   Mural thrombus of heart 08/26/2021   Formatting of this note might be different from the original. Jun 16, 2019 Entered By: [name] Comment: left ventricle, cardiac apex   Osteoarthritis    a. 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 Jun 16, 2019 Entered By: [name] Comment: leftAug 19, 2020 Entered By: [name] Comment: resolved by total left knee replacement Jun 16, 2019 Entered By: [name] Comment: leftAug 19, 2020 Entered By: [name] Comment: resolved by total left knee replacement   Trigger finger of left hand 07/07/2019   Vitamin  D deficiency     PAST SURGICAL HISTORY: Past Surgical History:  Procedure Laterality Date   BICEPS TENDON REPAIR Right 1993   CARDIAC CATHETERIZATION N/A 07/05/2015   Procedure: Left Heart Cath and Coronary Angiography;  Surgeon: Antonieta Iba, MD;  Location: [facility] INVASIVE CV LAB;  Service: Cardiovascular;  Laterality: N/A;   CAROTID PTA/STENT INTERVENTION Left 04/26/2023   Procedure: CAROTID PTA/STENT INTERVENTION;  Surgeon: Annice Needy, MD;  Location: [facility] INVASIVE CV LAB;  Service: Cardiovascular;  Laterality: Left;   CATARACT EXTRACTION Left 12/2016   with keratoplasty   COLONOSCOPY  2007   COLONOSCOPY WITH PROPOFOL N/A 12/02/2017   TA, SSA, rpt 3 yrs(Tahiliani, Varnita B, MD)   COLONOSCOPY WITH PROPOFOL N/A 11/26/2020   Procedure: COLONOSCOPY WITH PROPOFOL;  Surgeon: Midge Minium, MD;  Location[facility] ENDOSCOPY;  Service: Endoscopy;  Laterality: N/A;   CORONARY ARTERY BYPASS GRAFT N/A 07/29/2015   Procedure: CORONARY ARTERY BYPASS GRAFTING (CABG) x 5 (LIMA to LAD, SVG to DIAGONAL,  SVG SEQUENTIALLY to OM1 and OM2, SVG to OM3) with Endoscopic Vein Havesting of  GREATER SAPHENOUS VEIN from RIGHT THIGH and partial LOWER LEG ;  Surgeon: Alleen Borne, MD;  Location: [facility] OR;  Service: Open Heart Surgery;  Laterality: N/A;   EYE SURGERY     b/l cataract and cornea replaced    HAND SURGERY     left hand 1st/2nd trigger fingers Dr. Hyacinth Meeker ortho    JOINT REPLACEMENT     KNEE ARTHROSCOPY Left remote   MOHS SURGERY     left cheek scc 2022 Dr. Jeannine Boga   MOHS SURGERY     x 5 facial scc   right biceps tendon     repair/re attachment    SKIN CANCER EXCISION  10/2015   BCC - L ala (pending MOHs) and L scapula (complete excision)   TEE WITHOUT CARDIOVERSION N/A 07/29/2015   Procedure: TRANSESOPHAGEAL ECHOCARDIOGRAM (TEE);  Surgeon: Alleen Borne, MD;  Location: [facility] OR;  Service: Open Heart Surgery;  Laterality: N/A;   TONSILLECTOMY  1949   TOTAL KNEE ARTHROPLASTY Left 03/18/2016   cemented L TKR;  Deeann Saint, MD    FAMILY HISTORY: Family History  Problem Relation Age of Onset  CAD Father 41       MI   Hypertension Father    Hyperlipidemia Father    Alcoholism Father    Diabetes Father    Alcohol abuse Father    Heart disease Father    Cancer Daughter        dx'ed 39 retroperitoneal liposarcoma     SOCIAL HISTORY: Social History   Socioeconomic History   Marital status: Divorced    Spouse name: [place]   Number of children: 2   Years of education: Not on file   Highest education level: Some college, no degree  Occupational History    Comment: retired  Tobacco Use   Smoking status: Former    Current packs/day: 0.00    Average packs/day: 0.5 packs/day for 10.0 years (5.0 ttl pk-yrs)    Types: Cigarettes    Start date: 11/02/1968    Quit date: 11/02/1978    Years since quitting: 45.1   Smokeless tobacco: Never   Tobacco comments:    former smoker 1967-1980 1 pk/week no FH lung cancer   Vaping Use   Vaping status: Never Used  Substance and Sexual Activity   Alcohol use: Not Currently    Alcohol/week: 4.0 standard drinks of alcohol    Types: 4 Cans of beer per week    Comment: beer/wine on weekends   Drug use: No   Sexual activity: Not Currently    Birth control/protection: Abstinence  Other Topics Concern   Not on file  Social History Narrative   Lives with wife for 25yrs Greta Doom)   Moved from [place] years ago in 2015 to this area    Divorced, one daughter deceased   Retired Electronics engineer    Occupation Retired Clinical research associate   Edu: 1 yr college   Activity: volunteers at [employer]   Diet: good water, fruits/vegetables daily      Tested at risk for OSA in preop for CABG   Social Drivers of Health   Financial Resource Strain: Low Risk  (05/28/2023)   Overall Financial Resource Strain (CARDIA)    Difficulty of Paying Living Expenses: Not hard at all  Food Insecurity: No Food Insecurity (05/28/2023)   Hunger Vital Sign    Worried About Running Out of Food in the  Last Year: Never true    Ran Out of Food in the Last Year: Never true  Transportation Needs: No Transportation Needs (05/28/2023)   PRAPARE - Administrator, Civil Service (Medical): No    Lack of Transportation (Non-Medical): No  Physical Activity: Sufficiently Active (05/28/2023)   Exercise Vital Sign    Days of Exercise per Week: 4 days    Minutes of Exercise per Session: 40 min  Recent Concern: Physical Activity - Insufficiently Active (03/01/2023)   Exercise Vital Sign    Days of Exercise per Week: 4 days    Minutes of Exercise per Session: 20 min  Stress: No Stress Concern Present (05/28/2023)   [employer] of Occupational Health - Occupational Stress Questionnaire    Feeling of Stress : Not at all  Social Connections: Socially Integrated (05/28/2023)   Social Connection and Isolation Panel [NHANES]    Frequency of Communication with Friends and Family: More than three times a week    Frequency of Social Gatherings with Friends and Family: More than three times a week    Attends Religious Services: More than 4 times per year    Active Member of Clubs or Organizations: Yes    Attends  Club or Organization Meetings: More than 4 times per year    Marital Status: Married  Catering manager Violence: Not At Risk (05/28/2023)   Humiliation, Afraid, Rape, and Kick questionnaire    Fear of Current or Ex-Partner: No    Emotionally Abused: No    Physically Abused: No    Sexually Abused: No     PHYSICAL EXAM  GENERAL EXAM/CONSTITUTIONAL: Vitals:  Vitals:   12/20/23 1322  BP: 138/62  Pulse: 76  Weight: 205 lb (93 kg)  Height: 6' (1.829 m)   Body mass index is 27.8 kg/m. Wt Readings from Last 3 Encounters:  12/20/23 205 lb (93 kg)  10/12/23 202 lb (91.6 kg)  09/14/23 199 lb (90.3 kg)   Patient is in no distress; well developed, nourished and groomed SLIGHTLY DECR ROM IN HEAD / NECK WITH RIGHTWARD ROTATION AND EXTENSION  CARDIOVASCULAR: Examination of carotid  arteries is normal; no carotid bruits Regular rate and rhythm, no murmurs Examination of peripheral vascular system by observation and palpation is normal  EYES: Ophthalmoscopic exam of optic discs and posterior segments is normal; no papilledema or hemorrhages No results found.  MUSCULOSKELETAL: Gait, strength, tone, movements noted in Neurologic exam below  NEUROLOGIC: MENTAL STATUS:     02/18/2017    2:47 PM  MMSE - Mini Mental State Exam  Orientation to time 5  Orientation to Place 5  Registration 3  Attention/ Calculation 0  Recall 3  Language- name 2 objects 0  Language- repeat 1  Language- follow 3 step command 3  Language- read & follow direction 0  Write a sentence 0  Copy design 0  Total score 20   awake, alert, oriented to person, place and time recent and remote memory intact normal attention and concentration language fluent, comprehension intact, naming intact fund of knowledge appropriate  CRANIAL NERVE:  2nd - no papilledema on fundoscopic exam 2nd, 3rd, 4th, 6th - pupils equal and reactive to light, visual fields full to confrontation, extraocular muscles intact, no nystagmus 5th - facial sensation symmetric 7th - facial strength symmetric 8th - hearing intact 9th - palate elevates symmetrically, uvula midline 11th - shoulder shrug symmetric 12th - tongue protrusion midline  MOTOR:  normal bulk and tone, full strength in the BUE, BLE  SENSORY:  normal and symmetric to light touch, temperature, vibration  COORDINATION:  finger-nose-finger, fine finger movements normal  REFLEXES:  deep tendon reflexes TRACE and symmetric  GAIT/STATION:  narrow based gait; SHORT STEPS; STIFF GAIT     DIAGNOSTIC DATA (LABS, IMAGING, TESTING) - I reviewed patient records, labs, notes, testing and imaging myself where available.  Lab Results  Component Value Date   WBC 6.5 06/21/2023   HGB 14.2 06/21/2023   HCT 44.3 06/21/2023   MCV 97.8 06/21/2023    PLT 174.0 06/21/2023      Component Value Date/Time   NA 138 06/21/2023 1035   NA 141 11/28/2014 0000   NA 141 11/28/2014 0000   K 4.4 06/21/2023 1035   CL 100 06/21/2023 1035   CO2 27 06/21/2023 1035   GLUCOSE 106 (H) 06/21/2023 1035   BUN 20 06/21/2023 1035   BUN 12 11/28/2014 0000   CREATININE 0.90 06/21/2023 1035   CREATININE 1.06 09/02/2018 0948   CALCIUM 9.8 06/21/2023 1035   PROT 7.5 06/21/2023 1035   PROT 7.4 01/23/2016 0803   ALBUMIN 4.6 06/21/2023 1035   ALBUMIN 4.8 01/23/2016 0803   ALBUMIN 4.0 11/28/2014 0000   AST 19 06/21/2023 1035  AST 21 11/28/2014 0000   AST 21 11/28/2014 0000   ALT 15 06/21/2023 1035   ALT 27 11/28/2014 0000   ALT 27 11/28/2014 0000   ALKPHOS 59 06/21/2023 1035   ALKPHOS 53 11/28/2014 0000   ALKPHOS 53 11/28/2014 0000   BILITOT 0.8 06/21/2023 1035   BILITOT 0.6 01/23/2016 0803   BILITOT 0.5 11/28/2014 0000   BILITOT 0.5 11/28/2014 0000   GFRNONAA >60 05/02/2023 2014   GFRAA >60 11/29/2017 1039   Lab Results  Component Value Date   CHOL 133 06/21/2023   HDL 47.50 06/21/2023   LDLCALC 59 06/21/2023   TRIG 133.0 06/21/2023   CHOLHDL 3 06/21/2023   Lab Results  Component Value Date   HGBA1C 6.3 06/21/2023   Lab Results  Component Value Date   VITAMINB12 366 06/21/2023   Lab Results  Component Value Date   TSH 1.32 06/21/2023    01/18/22 CT head [I reviewed images myself. Minimal chronic small vessel ischemic disease. -VRP]  - atrophy and chronic infarcts - acute right parietal scalp hematoma  03/19/22 Carotid u/s 1. Right-greater-than-left atherosclerotic plaque of the distal common carotid arteries and carotid bulbs results in less than 50% stenosis by Doppler criteria bilaterally. 2. Bilateral vertebral arteries demonstrate normal antegrade flow.  03/05/22 xray cervical Multilevel degenerative disc and facet disease.  05/03/23 EEG This study is suggestive of cortical dysfunction arising from left temporal region, non  specific etiology. No seizures or epileptiform discharges were seen throughout the recording.    ASSESSMENT AND PLAN  83 y.o. year old male here with:   Dx:  1. Seizure disorder (HCC)      PLAN:  TRANSIENT APHASIA (5 events; ~1-5 minutes each; last event 05/03/23; slightly abnl EEG) - ddx: TIA vs partial seizures - continue stroke prevention (aspirin, plavix, statin, zetia, BP and DM control) - continue levetiracetam 500mg  twice a day   MINIMAL CEREBRAL SMALL VESSEL DISEASE  - continue medical management (BP, statin, DM control) - no evidence of prior stroke clinically  SYNCOPE (likely related to COVID infx) - continue supportive care; follow up with PCP and cardiology  Return in about 1 year (around 12/19/2024) for MyChart visit (15 min), with NP.    Suanne Marker, MD 12/20/2023, 1:38 PM Certified in Neurology, Neurophysiology and Neuroimaging  [facility] [address] [place], [place] [address] [phone]

## 2023-12-20 NOTE — Patient Instructions (Signed)
TRANSIENT APHASIA (5 events; ~1-5 minutes each; last event 05/03/23; slightly abnl EEG) - ddx: TIA vs partial seizures - continue stroke prevention (aspirin, plavix, statin, zetia, BP and DM control) - continue levetiracetam 500mg  twice a day   MINIMAL CEREBRAL SMALL VESSEL DISEASE  - continue medical management (BP, statin, DM control) - no evidence of prior stroke clinically  SYNCOPE (likely related to COVID infx) - continue supportive care; follow up with PCP and cardiology

## 2023-12-21 ENCOUNTER — Ambulatory Visit: Payer: Medicare Other

## 2023-12-21 DIAGNOSIS — M6281 Muscle weakness (generalized): Secondary | ICD-10-CM

## 2023-12-21 DIAGNOSIS — M545 Low back pain, unspecified: Secondary | ICD-10-CM

## 2023-12-21 DIAGNOSIS — R269 Unspecified abnormalities of gait and mobility: Secondary | ICD-10-CM | POA: Diagnosis not present

## 2023-12-21 DIAGNOSIS — M25552 Pain in left hip: Secondary | ICD-10-CM

## 2023-12-21 NOTE — Therapy (Signed)
OUTPATIENT PHYSICAL THERAPY LOWER EXTREMITY TREATMENT   Patient Name: Shannon Chung MRN: [id number] DOB:1941/02/15, 83 y.o., male Today's Date: 12/22/2023  END OF SESSION:  PT End of Session - 12/21/23 1542     Visit Number 3    Number of Visits 24    Date for PT Re-Evaluation 03/07/24    Progress Note Due on Visit 10    PT Start Time 1538    PT Stop Time 1616    PT Time Calculation (min) 38 min    Equipment Utilized During Treatment Gait belt    Activity Tolerance Patient tolerated treatment well    Behavior During Therapy WFL for tasks assessed/performed             Past Medical History:  Diagnosis Date   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 06/2015 Cardiac CT: Ca score 1103 (84th %'ile);  b. 07/2015 Cath: LM 70, LAD 80p, 100/77m, D1 70, D2 95, RI 75, RCA 100p/m;  c. 07/2015 CABG x 5 (LIMA->LAD, VG->Diag, VG->OM1->OM2, VG->OM3).   COVID-19    12/2021   COVID-19 01/18/2022   COVID-19 vaccine administered 01/18/2022   Unknown how many vaccine doses have been received. Entered from Emergency Triage Note.   Diabetes mellitus without complication (HCC) 07/2015   Dyslipidemia    Essential hypertension    Essential hypertension 01/02/2015   Formatting of this note might be different from the original.  Last Assessment & Plan:   Chronic, stable. Continue current regimen.   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 dilated, EF 35% improved to 45-50% (2015);  b. 07/2015 EF 25-35% by LV gram.   Ischemic cardiomyopathy 01/02/2015   Kidney stones 04/17/2021   Lone atrial fibrillation (HCC) 1983   a. isolated episode, not on OAC.   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 06/2014: LV; resolved with coumadin-->no residual on f/u echo, no longer on coumadin.   Mural thrombus of heart 08/26/2021   Formatting of this note might be different from the original. Jun 16, 2019 Entered By: [name] Comment: left ventricle,  cardiac apex   Osteoarthritis    a. 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 Jun 16, 2019 Entered By: [name] Comment: leftAug 19, 2020 Entered By: [name] Comment: resolved by total left knee replacement Jun 16, 2019 Entered By: [name] Comment: leftAug 19, 2020 Entered By: [name] Comment: resolved by total left knee replacement   Trigger finger of left hand 07/07/2019   Vitamin D deficiency    Past Surgical History:  Procedure Laterality Date   BICEPS TENDON REPAIR Right 1993   CARDIAC CATHETERIZATION N/A 07/05/2015   Procedure: Left Heart Cath and Coronary Angiography;  Surgeon: Antonieta Iba, MD;  Location: [facility] INVASIVE CV LAB;  Service: Cardiovascular;  Laterality: N/A;   CAROTID PTA/STENT INTERVENTION Left 04/26/2023   Procedure: CAROTID PTA/STENT INTERVENTION;  Surgeon: Annice Needy, MD;  Location: [facility] INVASIVE CV LAB;  Service: Cardiovascular;  Laterality: Left;   CATARACT EXTRACTION Left 12/2016   with keratoplasty   COLONOSCOPY  2007   COLONOSCOPY WITH PROPOFOL N/A 12/02/2017   TA, SSA, rpt 3 yrs(Tahiliani, Varnita B, MD)   COLONOSCOPY WITH PROPOFOL N/A 11/26/2020   Procedure: COLONOSCOPY WITH PROPOFOL;  Surgeon: Midge Minium, MD;  [facility] ENDOSCOPY;  Service: Endoscopy;  Laterality: N/A;   CORONARY ARTERY BYPASS GRAFT N/A 07/29/2015   Procedure: CORONARY ARTERY BYPASS GRAFTING (CABG) x 5 (LIMA to LAD, SVG to DIAGONAL,  SVG SEQUENTIALLY to OM1 and OM2, SVG to OM3) with Endoscopic Vein Havesting of  GREATER SAPHENOUS VEIN from RIGHT THIGH and partial LOWER LEG ;  Surgeon: Alleen Borne, MD;  Location: [facility] OR;  Service: Open Heart Surgery;  Laterality: N/A;   EYE SURGERY     b/l cataract and cornea replaced    HAND SURGERY     left hand 1st/2nd trigger fingers Dr. Hyacinth Meeker ortho    JOINT REPLACEMENT     KNEE ARTHROSCOPY Left remote   MOHS SURGERY     left cheek scc 2022 Dr. Jeannine Boga   MOHS  SURGERY     x 5 facial scc   right biceps tendon     repair/re attachment    SKIN CANCER EXCISION  10/2015   BCC - L ala (pending MOHs) and L scapula (complete excision)   TEE WITHOUT CARDIOVERSION N/A 07/29/2015   Procedure: TRANSESOPHAGEAL ECHOCARDIOGRAM (TEE);  Surgeon: Alleen Borne, MD;  Location: [facility] OR;  Service: Open Heart Surgery;  Laterality: N/A;   TONSILLECTOMY  1949   TOTAL KNEE ARTHROPLASTY Left 03/18/2016   cemented L TKR; Deeann Saint, MD   Patient Active Problem List   Diagnosis Date Noted   Annual physical exam 07/10/2023   TIA (transient ischemic attack) 05/02/2023  GERD without esophagitis 05/02/2023   Carotid stenosis, symptomatic, with infarction (HCC) 04/26/2023   History of CVA (cerebrovascular accident) without residual deficits 03/04/2023   Acute CVA (cerebrovascular accident) (HCC) 02/22/2023   Chronic radicular pain of lower back 08/10/2022   Cervical spondylosis 03/11/2022   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   Bilateral carotid artery stenosis 03/11/2022   Lumbar spondylosis 10/07/2021   DDD (degenerative disc disease), lumbar 10/07/2021   History of radiation therapy 08/26/2021   Aortic atherosclerosis (HCC) 04/17/2021   Diverticulosis 04/17/2021   Hypertension associated with diabetes (HCC) 10/07/2020   Overweight (BMI 25.0-29.9) 10/07/2020   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   Vitamin D deficiency 08/01/2020   Polyp of sigmoid colon 08/01/2020   Insomnia 06/07/2020   Gastroesophageal reflux disease 04/04/2020   Degenerative joint disease of hand 03/01/2020   BPH (benign prostatic hyperplasia) 08/05/2018   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   Macular scar of both eyes 04/20/2018   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   Scotoma involving central area of both eyes 04/20/2018   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   Fatty liver 03/25/2018   Erectile dysfunction 02/23/2017   Hx of CABG 01/24/2017   Advanced care planning/counseling  discussion 10/21/2015   Coronary artery disease of native artery of native heart with stable angina pectoris (HCC)    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   Dyslipidemia 01/02/2015   Osteoarthritis 01/02/2015   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/02/2014    PCP: Dana Allan, MD  REFERRING PROVIDER: Kennedy Bucker, MD  REFERRING DIAG:  S76.019A (ICD-10-CM) - Rupture of hip abductor tendon  S70.01XA (ICD-10-CM) - Contusion of right hip    THERAPY DIAG:  Abnormality of gait and mobility  Muscle weakness (generalized)  Bilateral low back pain without sciatica, unspecified chronicity  Pain in left hip  Rationale for Evaluation and Treatment: Rehabilitation  ONSET DATE: 08/09/2023  SUBJECTIVE:   SUBJECTIVE STATEMENT: My MD said I could go back to driving and back to volunteering    From Eval:  Fall at [facility] [place] on 08/09/2023- Contusion of right hip and rupture of hip abductor tendon. Conservative treatment and patient reports doing better. States leg is weak with some difficulty walking yet no pain.   PERTINENT HISTORY: The patient sustained a fall while visiting family in [place] back in Oct 2024 and pain has improved but reports still feeling week.    PAIN:  Are you having pain? No  PRECAUTIONS: Fall  RED FLAGS: None   WEIGHT BEARING RESTRICTIONS: No  FALLS:  Has patient fallen in last 6 months? Yes. 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  DIAGNOSTIC FINDINGS:  Narrative & Impression  CLINICAL DATA:   Larey Seat 1 month ago. Persistent right hip pain and difficulty walking.   EXAM: MR OF THE RIGHT HIP WITHOUT CONTRAST   TECHNIQUE: Multiplanar, multisequence MR imaging was performed. No intravenous contrast was administered.   COMPARISON:  None   FINDINGS: Abnormal marrow signal in 3 separate places in the right hemipelvis. 3 cm area of abnormal T1 and T2 signal intensity in the  right iliac bone adjacent to the SI joint without obvious fracture. Second much larger area of abnormal signal intensity in the superior acetabulum with suspected small nondisplaced fractures. Third area involves the ischiopubic junction on the right side with suspected fracture on the axial sequence. Findings are worrisome for underlying bone lesions and pathologic fractures. It is possible these are all traumatic but recommend CT scan for further evaluation and correlation with PSA level with history of prior prostate cancer.   Both hips are normally located. No hip fractures are identified. No bone lesions involving the hips. Age related degenerative changes. No joint effusion.   Bilateral peritrochanteric tendinopathy no tendon rupture or bursitis. The hamstring tendons are intact. Moderate tendinopathy on the left. No muscle tears, myositis or muscle mass. Age related fatty atrophy.   Advanced degenerative changes and scoliosis noted in the lower lumbar spine.   No significant intrapelvic abnormalities are identified. No pelvic or inguinal adenopathy.   IMPRESSION: 1. Abnormal marrow signal in 3 separate places in the right hemipelvis worrisome for underlying bone lesions and pathologic fractures. It is possible these are all traumatic but recommend CT scan for further evaluation and correlation with PSA level with history of prior prostate cancer. 2. No hip fractures are identified. Age related degenerative changes. 3. Bilateral peritrochanteric tendinopathy but no tendon rupture  or bursitis. 4. Advanced degenerative changes and scoliosis in the lower lumbar spine.     Electronically Signed   By: Rudie Meyer M.D.   On: 09/09/2023 16:39    PATIENT SURVEYS:  LEFS 57/80  COGNITION: Overall cognitive status: Within functional limits for tasks assessed     SENSATION: WFL  EDEMA:  None observed   PALPATION: No tenderness noted along lateral right hip  LOWER EXTREMITY ROM:  Active ROM Right eval Left eval  Hip flexion    Hip extension    Hip abduction    Hip adduction    Hip internal rotation    Hip external rotation    Knee flexion    Knee extension    Ankle dorsiflexion    Ankle plantarflexion    Ankle inversion    Ankle eversion     (Blank rows = not tested)  LOWER EXTREMITY MMT:  MMT Right eval Left eval  Hip flexion 4 5  Hip extension 4 5  Hip abduction 2+ 5  Hip adduction 5 5  Hip internal rotation 3+ 5  Hip external rotation 3+ 5  Knee flexion 5 5  Knee extension 5 5  Ankle dorsiflexion 5 5  Ankle plantarflexion    Ankle inversion    Ankle eversion     (Blank rows = not tested)  LOWER EXTREMITY SPECIAL TESTS:  Hip special tests: Trendelenburg test: positive , Thomas test: negative, and Ober's test: negative  FUNCTIONAL TESTS:  5 times sit to stand: 20.01 sec without UE support  10 meter walk test: 0.74 m/s Berg Balance Scale: To be assessed next visit  GAIT: Distance walked: approx 100 feet Assistive device utilized: None Level of assistance: SBA Comments: (+) trendelenburg and shuffling gait with decreased foot clearance  TREATMENT DATE: 12/21/2023  TE: Sidelye Hip abd RTB 2 sets of 10 reps Sidelye Hip ER (clamshell) RTB 2 sets of 10 reps Supine (hooklye) bridge 2 x 12 reps  Supine combo (hooklye) bridge +hip abd (RTB around distal quads) x 12 reps.  TA:  Seated Hip ER (to  assist to lift LE up to tie shoes) 2 x 10 reps Seated hip flex/abd/add 3# AW lifting over 1/2 spike ball x 10 reps then progressed to orange hurdle x 8 more reps.  Forward gait focusing on heel to toe (VC to keep feet pointed straight ahead and for specific heel/toe sequencing) x 60 feet x 3    PATIENT EDUCATION:  Education details: Purpose of PT; Anatomy of Hip; HEP Person educated: Patient Education method: Explanation, Demonstration, Tactile cues, Verbal cues, and Handouts Education comprehension: verbalized understanding, returned demonstration, verbal cues required, tactile cues required, and needs further education  HOME EXERCISE PROGRAM: Access Code: [email or web address] URL: [email or web address] Date: 12/14/2023 Prepared by: Maureen Ralphs  Exercises - Sidelying Hip Abduction  - 3 x weekly - 3 sets - 10 reps - 2 sec hold - Clamshell with Resistance  - 3 x weekly - 3 sets - 10 reps  ASSESSMENT:  CLINICAL IMPRESSION: Treatment focused on progressive LE Strengthening and functional mobility. Patient responded well overall to added resistance to exercises but did endorse some fatigue with right hip. He later responded well to [facility] for gait sequencing- able to increase his step length with less overall shuffling.  Pt will benefit from PT services to address deficits in strength, balance, and mobility in order to return to full function at home with decreased risk of falling.    OBJECTIVE IMPAIRMENTS: Abnormal gait, decreased activity tolerance, decreased balance, decreased coordination, decreased endurance, decreased knowledge of condition, decreased mobility, difficulty walking, decreased strength, and hypomobility.   ACTIVITY LIMITATIONS: lifting, bending, standing, squatting, stairs, and transfers  PARTICIPATION LIMITATIONS: cleaning, laundry, shopping, community activity, occupation, and yard work  PERSONAL FACTORS: Age and 1-2 comorbidities: CVA, Prostate CA  are also  affecting patient's functional outcome.   REHAB POTENTIAL: Good  CLINICAL DECISION MAKING: Evolving/moderate complexity  EVALUATION COMPLEXITY: Moderate   GOALS: Goals reviewed with patient? Yes  SHORT TERM GOALS: Target date: 01/25/2024 Pt will be independent with HEP in order to  increase strength/balance in order to improvefunction at home and work. Baseline: EVAL- No formal HEP in place Goal status: INITIAL   LONG TERM GOALS: Target date: 03/07/2024  Pt will increase LEFS by at least 9 points in order to demonstrate significant improvement in lower extremity function.   Baseline: EVAL= 57/80 Goal status: INITIAL  2.  Patient will demonstrate improved right Hip Abd strength as seen by ability to achieve Full ROM in sidelye position Baseline: EVAL- Minimal able to raise R LE in sidelye Goal status: INITIAL  3.  Patient (> 47 years old) will complete five times sit to stand test in < 15 seconds indicating an increased LE strength and improved balance. Baseline: EVAL= 20.01 sec without UE support Goal status: INITIAL    4.  Patient will increase Berg Balance score by > 6 points to demonstrate decreased fall risk during functional activities. Baseline: EVAL: to be assessed visit #2; 12/16/2023= 43/56 Goal status: INITIAL    5.   Patient will increase 10 meter walk test to >1.44m/s as to improve gait speed for better community ambulation and to reduce fall risk. Baseline: EVAL= 0.74 Goal status: INITIAL  6.   Patient will  increase six minute walk test distance to >1000 for progression to community ambulator and improve gait ability Baseline: EVAL -to be assessed visit #2; 12/16/2023= 765 feet Goal status: INITIAL   PLAN:  PT FREQUENCY: 1-2x/week  PT DURATION: 12 weeks  PLANNED INTERVENTIONS: 97164- PT Re-evaluation, 97110-Therapeutic exercises, 97530- Therapeutic activity, 97112- Neuromuscular re-education, 97535- Self Care, 81191- Manual therapy, [phone]- Gait training,  [phone]- Orthotic Fit/training, [phone]- Electrical stimulation (manual), Patient/Family education, Balance training, Stair training, Taping, Dry Needling, Joint mobilization, Joint manipulation, Spinal manipulation, Spinal mobilization, DME instructions, Cryotherapy, and Moist heat  PLAN FOR NEXT SESSION:  continue with progressive therex for Hip   Lenda Kelp, PT 12/22/2023, 10:26 AM

## 2023-12-23 ENCOUNTER — Ambulatory Visit: Payer: Medicare Other

## 2023-12-23 DIAGNOSIS — R269 Unspecified abnormalities of gait and mobility: Secondary | ICD-10-CM

## 2023-12-23 DIAGNOSIS — M545 Low back pain, unspecified: Secondary | ICD-10-CM

## 2023-12-23 DIAGNOSIS — M25552 Pain in left hip: Secondary | ICD-10-CM

## 2023-12-23 DIAGNOSIS — M6281 Muscle weakness (generalized): Secondary | ICD-10-CM

## 2023-12-23 NOTE — Therapy (Signed)
OUTPATIENT PHYSICAL THERAPY LOWER EXTREMITY TREATMENT   Patient Name: Shannon Chung MRN: [id number] DOB:[birth year], 83 y.o., male Today's Date: 12/23/2023  END OF SESSION:  PT End of Session - 12/23/23 1534     Visit Number 4    Number of Visits 24    Date for PT Re-Evaluation 03/07/24    Progress Note Due on Visit 10    PT Start Time 1531    PT Stop Time 1615    PT Time Calculation (min) 44 min    Equipment Utilized During Treatment Gait belt    Activity Tolerance Patient tolerated treatment well    Behavior During Therapy WFL for tasks assessed/performed              Past Medical History:  Diagnosis Date   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 06/2015 Cardiac CT: Ca score 1103 (84th %'ile);  b. 07/2015 Cath: LM 70, LAD 80p, 100/58m, D1 70, D2 95, RI 75, RCA 100p/m;  c. 07/2015 CABG x 5 (LIMA->LAD, VG->Diag, VG->OM1->OM2, VG->OM3).   COVID-19    12/2021   COVID-19 01/18/2022   COVID-19 vaccine administered 01/18/2022   Unknown how many vaccine doses have been received. Entered from Emergency Triage Note.   Diabetes mellitus without complication (HCC) 07/2015   Dyslipidemia    Essential hypertension    Essential hypertension 01/02/2015   Formatting of this note might be different from the original.  Last Assessment & Plan:   Chronic, stable. Continue current regimen.   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 dilated, EF 35% improved to 45-50% (2015);  b. 07/2015 EF 25-35% by LV gram.   Ischemic cardiomyopathy 01/02/2015   Kidney stones 04/17/2021   Lone atrial fibrillation (HCC) 1983   a. isolated episode, not on OAC.   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 06/2014: LV; resolved with coumadin-->no residual on f/u echo, no longer on coumadin.   Mural thrombus of heart 08/26/2021   Formatting of this note might be different from the original. Jun 16, 2019 Entered By: [name] Comment: left  ventricle, cardiac apex   Osteoarthritis    a. 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 Jun 16, 2019 Entered By: [name] Comment: leftAug 19, 2020 Entered By: [name] Comment: resolved by total left knee replacement Jun 16, 2019 Entered By: [name] Comment: leftAug 19, 2020 Entered By: [name] Comment: resolved by total left knee replacement   Trigger finger of left hand 07/07/2019   Vitamin D deficiency    Past Surgical History:  Procedure Laterality Date   BICEPS TENDON REPAIR Right 1993   CARDIAC CATHETERIZATION N/A 07/05/2015   Procedure: Left Heart Cath and Coronary Angiography;  Surgeon: Antonieta Iba, MD;  Location: [facility] INVASIVE CV LAB;  Service: Cardiovascular;  Laterality: N/A;   CAROTID PTA/STENT INTERVENTION Left 04/26/2023   Procedure: CAROTID PTA/STENT INTERVENTION;  Surgeon: Annice Needy, MD;  Location: [facility] INVASIVE CV LAB;  Service: Cardiovascular;  Laterality: Left;   CATARACT EXTRACTION Left 12/2016   with keratoplasty   COLONOSCOPY  2007   COLONOSCOPY WITH PROPOFOL N/A 12/02/2017   TA, SSA, rpt 3 yrs(Tahiliani, Varnita B, MD)   COLONOSCOPY WITH PROPOFOL N/A 11/26/2020   Procedure: COLONOSCOPY WITH PROPOFOL;  Surgeon: Midge Minium, MD;  Location: [facility] ENDOSCOPY;  Service: Endoscopy;  Laterality: N/A;   CORONARY ARTERY BYPASS GRAFT N/A 07/29/2015   Procedure: CORONARY ARTERY BYPASS GRAFTING (CABG) x 5 (LIMA to LAD, SVG to DIAGONAL,  SVG SEQUENTIALLY to OM1 and OM2, SVG to OM3) with Endoscopic Vein Havesting of  GREATER SAPHENOUS VEIN from RIGHT THIGH and partial LOWER LEG ;  Surgeon: Alleen Borne, MD;  Location: [facility] OR;  Service: Open Heart Surgery;  Laterality: N/A;   EYE SURGERY     b/l cataract and cornea replaced    HAND SURGERY     left hand 1st/2nd trigger fingers Dr. Hyacinth Meeker ortho    JOINT REPLACEMENT     KNEE ARTHROSCOPY Left remote   MOHS SURGERY     left cheek scc 2022 Dr. Jeannine Boga   MOHS  SURGERY     x 5 facial scc   right biceps tendon     repair/re attachment    SKIN CANCER EXCISION  10/2015   BCC - L ala (pending MOHs) and L scapula (complete excision)   TEE WITHOUT CARDIOVERSION N/A 07/29/2015   Procedure: TRANSESOPHAGEAL ECHOCARDIOGRAM (TEE);  Surgeon: Alleen Borne, MD;  Location: [facility] OR;  Service: Open Heart Surgery;  Laterality: N/A;   TONSILLECTOMY  1949   TOTAL KNEE ARTHROPLASTY Left 03/18/2016   cemented L TKR; Deeann Saint, MD   Patient Active Problem List   Diagnosis Date Noted   Annual physical exam 07/10/2023   TIA (transient ischemic attack) 05/02/2023  GERD without esophagitis 05/02/2023   Carotid stenosis, symptomatic, with infarction (HCC) 04/26/2023   History of CVA (cerebrovascular accident) without residual deficits 03/04/2023   Acute CVA (cerebrovascular accident) (HCC) 02/22/2023   Chronic radicular pain of lower back 08/10/2022   Cervical spondylosis 03/11/2022   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   Bilateral carotid artery stenosis 03/11/2022   Lumbar spondylosis 10/07/2021   DDD (degenerative disc disease), lumbar 10/07/2021   History of radiation therapy 08/26/2021   Aortic atherosclerosis (HCC) 04/17/2021   Diverticulosis 04/17/2021   Hypertension associated with diabetes (HCC) 10/07/2020   Overweight (BMI 25.0-29.9) 10/07/2020   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   Vitamin D deficiency 08/01/2020   Polyp of sigmoid colon 08/01/2020   Insomnia 06/07/2020   Gastroesophageal reflux disease 04/04/2020   Degenerative joint disease of hand 03/01/2020   BPH (benign prostatic hyperplasia) 08/05/2018   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   Macular scar of both eyes 04/20/2018   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   Scotoma involving central area of both eyes 04/20/2018   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   Fatty liver 03/25/2018   Erectile dysfunction 02/23/2017   Hx of CABG 01/24/2017   Advanced care planning/counseling  discussion 10/21/2015   Coronary artery disease of native artery of native heart with stable angina pectoris (HCC)    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   Dyslipidemia 01/02/2015   Osteoarthritis 01/02/2015   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/02/2014    PCP: Dana Allan, MD  REFERRING PROVIDER: Kennedy Bucker, MD  REFERRING DIAG:  S76.019A (ICD-10-CM) - Rupture of hip abductor tendon  S70.01XA (ICD-10-CM) - Contusion of right hip    THERAPY DIAG:  Abnormality of gait and mobility  Muscle weakness (generalized)  Bilateral low back pain without sciatica, unspecified chronicity  Pain in left hip  Rationale for Evaluation and Treatment: Rehabilitation  ONSET DATE: 08/09/2023  SUBJECTIVE:   SUBJECTIVE STATEMENT: My legs are a little sore after last visit but I guess that just shows me I have some work to do.    From Eval:  Fall at dtr house [place] on 08/09/2023- Contusion of right hip and rupture of hip abductor tendon. Conservative treatment and patient reports doing better. States leg is weak with some difficulty walking yet no pain.   PERTINENT HISTORY: The patient sustained a fall while visiting family in [place] back in Oct 2024 and pain has improved but reports still feeling week.    PAIN:  Are you having pain? No  PRECAUTIONS: Fall  RED FLAGS: None   WEIGHT BEARING RESTRICTIONS: No  FALLS:  Has patient fallen in last 6 months? Yes. 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  DIAGNOSTIC FINDINGS:   Narrative & Impression  CLINICAL DATA:  Larey Seat 1 month ago. Persistent right hip pain and difficulty walking.   EXAM: MR OF THE RIGHT HIP WITHOUT CONTRAST   TECHNIQUE: Multiplanar, multisequence MR imaging was performed. No intravenous contrast was administered.   COMPARISON:  None   FINDINGS: Abnormal marrow signal in 3 separate places in the right hemipelvis. 3 cm area  of abnormal T1 and T2 signal intensity in the right iliac bone adjacent to the SI joint without obvious fracture. Second much larger area of abnormal signal intensity in the superior acetabulum with suspected small nondisplaced fractures. Third area involves the ischiopubic junction on the right side with suspected fracture on the axial sequence. Findings are worrisome for underlying bone lesions and pathologic fractures. It is possible these are all traumatic but recommend CT scan for further evaluation and correlation with PSA level with history of prior prostate cancer.   Both hips are normally located. No hip fractures are identified. No bone lesions involving the hips. Age related degenerative changes. No joint effusion.   Bilateral peritrochanteric tendinopathy no tendon rupture or bursitis. The hamstring tendons are intact. Moderate tendinopathy on the left. No muscle tears, myositis or muscle mass. Age related fatty atrophy.   Advanced degenerative changes and scoliosis noted in the lower lumbar spine.   No significant intrapelvic abnormalities are identified. No pelvic or inguinal adenopathy.   IMPRESSION: 1. Abnormal marrow signal in 3 separate places in the right hemipelvis worrisome for underlying bone lesions and pathologic fractures. It is possible these are all traumatic but recommend CT scan for further evaluation and correlation with PSA level with history of prior prostate cancer. 2. No hip fractures are identified. Age related degenerative changes. 3. Bilateral peritrochanteric  tendinopathy but no tendon rupture or bursitis. 4. Advanced degenerative changes and scoliosis in the lower lumbar spine.     Electronically Signed   By: Rudie Meyer M.D.   On: 09/09/2023 16:39    PATIENT SURVEYS:  LEFS 57/80  COGNITION: Overall cognitive status: Within functional limits for tasks assessed     SENSATION: WFL  EDEMA:  None observed   PALPATION: No tenderness noted along lateral right hip  LOWER EXTREMITY ROM:  Active ROM Right eval Left eval  Hip flexion    Hip extension    Hip abduction    Hip adduction    Hip internal rotation    Hip external rotation    Knee flexion    Knee extension    Ankle dorsiflexion    Ankle plantarflexion    Ankle inversion    Ankle eversion     (Blank rows = not tested)  LOWER EXTREMITY MMT:  MMT Right eval Left eval  Hip flexion 4 5  Hip extension 4 5  Hip abduction 2+ 5  Hip adduction 5 5  Hip internal rotation 3+ 5  Hip external rotation 3+ 5  Knee flexion 5 5  Knee extension 5 5  Ankle dorsiflexion 5 5  Ankle plantarflexion    Ankle inversion    Ankle eversion     (Blank rows = not tested)  LOWER EXTREMITY SPECIAL TESTS:  Hip special tests: Trendelenburg test: positive , Thomas test: negative, and Ober's test: negative  FUNCTIONAL TESTS:  5 times sit to stand: 20.01 sec without UE support  10 meter walk test: 0.74 m/s Berg Balance Scale: To be assessed next visit  GAIT: Distance walked: approx 100 feet Assistive device utilized: None Level of assistance: SBA Comments: (+) trendelenburg and shuffling gait with decreased foot clearance  TREATMENT DATE: 12/23/2023  NMR:  Attempted Standing hip march on airex pad  Dynamic hip march without UE suppport Dynamic walking while holding onto airex pad with golf ball on top- forward,  Static stand with varying feet  position holding onto airex pad with golf ball on top  Lateral step over 1/2 foam roll x 20  Forward/retro step over 1/2 foam roll  x 20 reps  Dynamic walking in clinic (4 cones positioned in rectangle- approx 8 feet x 4)- forward, sidestep, retro step along cones x 3 trials- focusing on balance and taking longer or wider steps.     TA:  Sit to stand x 10 holding onto 5kg ball (Patient reports as very tiring)    PATIENT EDUCATION:  Education details: Purpose of PT; Anatomy of Hip; HEP Person educated: Patient Education method: Explanation, Demonstration, Tactile cues, Verbal cues, and Handouts Education comprehension: verbalized understanding, returned demonstration, verbal cues required, tactile cues required, and needs further education  HOME EXERCISE PROGRAM: Access Code: [email or web address] URL: [email or web address] Date: 12/14/2023 Prepared by: Maureen Ralphs  Exercises - Sidelying Hip Abduction  - 3 x weekly - 3 sets - 10 reps - 2 sec hold - Clamshell with Resistance  - 3 x weekly - 3 sets - 10 reps  ASSESSMENT:  CLINICAL IMPRESSION: Patient progressed to more standing functional strengthening and balance training. Patient challenged with abduction activities with some gluteal weakness. He rated some activities as very challenged including sit to stand and side step over 1/2 foam.    Pt will benefit from PT services to address deficits in strength, balance, and mobility in order to return to full function at home with decreased risk of falling.    OBJECTIVE IMPAIRMENTS: Abnormal gait, decreased activity tolerance, decreased balance, decreased coordination, decreased endurance, decreased knowledge of condition, decreased mobility, difficulty walking, decreased strength, and hypomobility.   ACTIVITY LIMITATIONS: lifting, bending, standing, squatting, stairs, and transfers  PARTICIPATION LIMITATIONS: cleaning, laundry, shopping, community activity, occupation, and yard  work  PERSONAL FACTORS: Age and 1-2 comorbidities: CVA, Prostate CA  are also affecting patient's functional outcome.   REHAB POTENTIAL: Good  CLINICAL DECISION MAKING: Evolving/moderate complexity  EVALUATION COMPLEXITY: Moderate   GOALS: Goals reviewed with patient? Yes  SHORT TERM GOALS: Target date: 01/25/2024 Pt will be independent with HEP in order to  increase strength/balance in order to improvefunction at home and work. Baseline: EVAL- No formal HEP in place Goal status: INITIAL   LONG TERM GOALS: Target date: 03/07/2024  Pt will increase LEFS by at least 9 points in order to demonstrate significant improvement in lower extremity function.   Baseline: EVAL= 57/80 Goal status: INITIAL  2.  Patient will demonstrate improved right Hip Abd strength as seen by ability to achieve Full ROM in sidelye position Baseline: EVAL- Minimal able to raise R LE in sidelye Goal status: INITIAL  3.  Patient (> 74 years old) will complete five times sit to stand test in < 15 seconds indicating an increased LE strength and improved balance. Baseline: EVAL= 20.01 sec without UE support Goal status: INITIAL    4.  Patient will increase Berg Balance score by > 6 points to demonstrate decreased fall risk during functional activities. Baseline: EVAL: to be assessed visit #2; 12/16/2023= 43/56 Goal status: INITIAL    5.   Patient will increase 10 meter walk test to >1.67m/s as to improve gait speed for better community ambulation and to reduce fall risk. Baseline: EVAL= 0.74 Goal status: INITIAL  6.  Patient will increase six minute walk test distance to >1000 for progression to community ambulator and improve gait ability Baseline: EVAL -to be assessed visit #2; 12/16/2023= 765 feet Goal status: INITIAL   PLAN:  PT FREQUENCY: 1-2x/week  PT DURATION: 12 weeks  PLANNED INTERVENTIONS: 97164- PT Re-evaluation, 97110-Therapeutic exercises, 97530- Therapeutic activity, 97112-  Neuromuscular re-education, 97535- Self Care, 16109- Manual therapy, [phone]- Gait training, [phone]- Orthotic Fit/training, [phone]- Electrical stimulation (manual), Patient/Family education, Balance training, Stair training, Taping, Dry Needling, Joint mobilization, Joint manipulation, Spinal manipulation, Spinal mobilization, DME instructions, Cryotherapy, and Moist heat  PLAN FOR NEXT SESSION:  Continue with progressive therex for Hip strengthening    Lenda Kelp, PT 12/23/2023, 4:23 PM

## 2023-12-27 ENCOUNTER — Ambulatory Visit: Payer: Medicare Other | Admitting: Family Medicine

## 2023-12-27 ENCOUNTER — Encounter: Payer: Self-pay | Admitting: Family Medicine

## 2023-12-27 VITALS — BP 128/70 | HR 59 | Temp 98.0°F | Resp 18 | Ht 72.0 in | Wt 202.1 lb

## 2023-12-27 DIAGNOSIS — E1159 Type 2 diabetes mellitus with other circulatory complications: Secondary | ICD-10-CM | POA: Diagnosis not present

## 2023-12-27 DIAGNOSIS — I152 Hypertension secondary to endocrine disorders: Secondary | ICD-10-CM | POA: Diagnosis not present

## 2023-12-27 DIAGNOSIS — Z8673 Personal history of transient ischemic attack (TIA), and cerebral infarction without residual deficits: Secondary | ICD-10-CM | POA: Diagnosis not present

## 2023-12-27 DIAGNOSIS — E118 Type 2 diabetes mellitus with unspecified complications: Secondary | ICD-10-CM

## 2023-12-27 LAB — COMPREHENSIVE METABOLIC PANEL
ALT: 12 U/L (ref 0–53)
AST: 18 U/L (ref 0–37)
Albumin: 4.7 g/dL (ref 3.5–5.2)
Alkaline Phosphatase: 52 U/L (ref 39–117)
BUN: 20 mg/dL (ref 6–23)
CO2: 28 meq/L (ref 19–32)
Calcium: 9.9 mg/dL (ref 8.4–10.5)
Chloride: 100 meq/L (ref 96–112)
Creatinine, Ser: 1.02 mg/dL (ref 0.40–1.50)
GFR: 69.48 mL/min (ref >60.00)
Glucose, Bld: 122 mg/dL — ABNORMAL HIGH (ref 70–99)
Potassium: 4.6 meq/L (ref 3.5–5.1)
Sodium: 138 meq/L (ref 135–145)
Total Bilirubin: 0.7 mg/dL (ref 0.2–1.2)
Total Protein: 7.9 g/dL (ref 6.0–8.3)

## 2023-12-27 LAB — POCT GLYCOSYLATED HEMOGLOBIN (HGB A1C): Hemoglobin A1C: 6.1 % — AB (ref 4.0–5.6)

## 2023-12-27 NOTE — Progress Notes (Signed)
KF

## 2023-12-27 NOTE — Patient Instructions (Addendum)
 It was a pleasure meeting you today. Thank you for allowing me to take part in your health care.  Our goals for today as we discussed include:  A1c 6.1.  Good glucose control  We will get some labs today.  If they are abnormal or we need to do something about them, I will call you.  If they are normal, I will send you a message on MyChart (if it is active) or a letter in the mail.  If you don't hear from [place] in 2 weeks, please call the office at the number below.   Will send you letter for work once I have heard back from Neurology.   This is a list of the screening recommended for you and due dates:  Health Maintenance  Topic Date Due   COVID-19 Vaccine (6 - 2024-25 season) 07/04/2023   Eye exam for diabetics  02/03/2024   Yearly kidney health urinalysis for diabetes  05/06/2024   Medicare Annual Wellness Visit  05/27/2024   Yearly kidney function blood test for diabetes  06/20/2024   Hemoglobin A1C  06/25/2024   Complete foot exam   12/26/2024   DTaP/Tdap/Td vaccine (2 - Td or Tdap) 05/01/2025   Pneumonia Vaccine  Completed   Flu Shot  Completed   Zoster (Shingles) Vaccine  Completed   HPV Vaccine  Aged Out   Colon Cancer Screening  Discontinued   Hepatitis C Screening  Discontinued      If you have any questions or concerns, please do not hesitate to call the office at [phone].  I look forward to our next visit and until then take care and stay safe.  Regards,   Dana Allan, MD   [facility]

## 2023-12-27 NOTE — Progress Notes (Signed)
 SUBJECTIVE:   Chief Complaint  Patient presents with   Medical Management of Chronic Issues    6 month follow up   HPI Presents for follow up chronic disease management  Discussed the use of AI scribe software for clinical note transcription with the patient, who gave verbal consent to proceed.  History of Present Illness Shannon Chung is an 83 year old male with diabetes who presents for a follow-up visit.  He has diabetes with stable blood sugar levels at home, around 90, and a recent A1c of 6.1, indicating good control. He is taking lisinopril and Plavix, with no need for medication refills.  He recently saw his neurologist, who was pleased with his progress and has allowed him to return to work and drive. He continues to take Keppra, one tablet twice a day, for another year.  He has been attending physical therapy for two weeks, focusing on strengthening his hip muscles, and reports improvement in his ability to stand and perform exercises.  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 He has been driving locally without issues and reports no dizziness or weakness. No dizziness, no weakness.      PERTINENT PMH / PSH: As above  OBJECTIVE:  BP 128/70   Pulse (!) 59   Temp 98 F (36.7 C)   Resp 18   Ht 6' (1.829 m)   Wt 202 lb 2 oz (91.7 kg)   SpO2 98%   BMI 27.41 kg/m    Physical Exam Vitals reviewed.  Constitutional:      General: He is not in acute distress.    Appearance: Normal appearance. He is normal weight. He is not ill-appearing, toxic-appearing or diaphoretic.  Eyes:     General:        Right eye: No discharge.        Left eye: No discharge.  Cardiovascular:     Rate and Rhythm: Normal rate and regular rhythm.     Heart sounds: Normal heart sounds.  Pulmonary:     Effort: Pulmonary effort is normal.     Breath sounds: Normal breath sounds.   Abdominal:     General: Bowel sounds are normal.  Musculoskeletal:        General: Normal range of motion.     Cervical back: Normal range of motion.  Skin:    General: Skin is warm and dry.  Neurological:     Mental Status: He is alert and oriented to person, place, and time. Mental status is at baseline.  Psychiatric:        Mood and Affect: Mood normal.        Behavior: Behavior normal.        Thought Content: Thought content normal.        Judgment: Judgment normal.           12/27/2023   11:18 AM 06/23/2023    1:02 PM 05/28/2023   11:21 AM 05/05/2023   11:11 AM 03/04/2023    8:08 AM  Depression screen PHQ 2/9  Decreased Interest 0 0 0 0 0  Down, Depressed, Hopeless 0 0 0 0 0  PHQ - 2 Score 0 0 0 0 0  Altered sleeping 0 0 0 0   Tired, decreased energy 0 0 0 0   Change in appetite 0 0  0   Feeling bad or failure about yourself  0 0 0 0   Trouble concentrating 0 0 0 0   Moving slowly or fidgety/restless 0 0  0   Suicidal thoughts 0 0 0 0   PHQ-9 Score 0 0 0 0   Difficult doing work/chores Not difficult at all Not difficult at all Not difficult at all Not difficult at all       12/27/2023   11:18 AM 06/23/2023    1:02 PM 05/05/2023   11:11 AM  GAD 7 : Generalized Anxiety Score  Nervous, Anxious, on Edge 0 0 0  Control/stop worrying 0 0 0  Worry too much - different things 0 0 0  Trouble relaxing 0 0 0  Restless 0 0 0  Easily annoyed or irritable 0 0 0  Afraid - awful might happen 0 0 0  Total GAD 7 Score 0 0 0  Anxiety Difficulty Not difficult at all Not difficult at all Not difficult at all    ASSESSMENT/PLAN:  Type 2 diabetes mellitus with complications (HCC) Assessment & Plan: Well controlled with home glucose readings in the 90s and A1c of 6.1. -Continue current medication regimen. -Check kidney function today (last checked 6 months ago).  Orders: -     HM Diabetes Foot Exam -     POCT glycosylated hemoglobin (Hb A1C) -     Comprehensive metabolic  panel  Hypertension associated with diabetes (HCC) Assessment & Plan: Well controlled, no changes in medication needed. -Continue Metoprolol XL 25 mg daily -Continue Lisinopril 10 mg daily -Check Cmet   Orders: -     Comprehensive metabolic panel  History of CVA (cerebrovascular accident) without residual deficits Assessment & Plan: Patient reports feeling well and has been released to drive and return to work by neurologist. Currently undergoing physical therapy with noted improvement in strength. -Continue Keppra as directed by neurologist. -Continue physical therapy as planned. -Review neurologist's notes and provide work release note for patient to return to work.  Messaged Neurology, ok from neurology's view to return to work.    Note to return to work sent to patient via MyChart  PDMP reviewed  Return in about 6 months (around 06/25/2024) for PCP, DM.  Dana Allan, MD

## 2023-12-28 ENCOUNTER — Ambulatory Visit: Payer: Medicare Other

## 2023-12-28 DIAGNOSIS — R269 Unspecified abnormalities of gait and mobility: Secondary | ICD-10-CM

## 2023-12-28 DIAGNOSIS — M25552 Pain in left hip: Secondary | ICD-10-CM

## 2023-12-28 DIAGNOSIS — M545 Low back pain, unspecified: Secondary | ICD-10-CM

## 2023-12-28 DIAGNOSIS — M6281 Muscle weakness (generalized): Secondary | ICD-10-CM

## 2023-12-28 NOTE — Therapy (Signed)
 OUTPATIENT PHYSICAL THERAPY LOWER EXTREMITY TREATMENT   Patient Name: Shannon Chung MRN: [id number] DOB:June 22, 1941, 83 y.o., male Today's Date: 12/29/2023  END OF SESSION:  PT End of Session - 12/28/23 1602     Visit Number 5    Number of Visits 24    Date for PT Re-Evaluation 03/07/24    Progress Note Due on Visit 10    PT Start Time 1615    PT Stop Time 1700    PT Time Calculation (min) 45 min    Equipment Utilized During Treatment Gait belt    Activity Tolerance Patient tolerated treatment well    Behavior During Therapy WFL for tasks assessed/performed               Past Medical History:  Diagnosis Date   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 06/2015 Cardiac CT: Ca score 1103 (84th %'ile);  b. 07/2015 Cath: LM 70, LAD 80p, 100/40m, D1 70, D2 95, RI 75, RCA 100p/m;  c. 07/2015 CABG x 5 (LIMA->LAD, VG->Diag, VG->OM1->OM2, VG->OM3).   COVID-19    12/2021   COVID-19 01/18/2022   COVID-19 vaccine administered 01/18/2022   Unknown how many vaccine doses have been received. Entered from Emergency Triage Note.   Diabetes mellitus without complication (HCC) 07/2015   Dyslipidemia    Essential hypertension    Essential hypertension 01/02/2015   Formatting of this note might be different from the original.  Last Assessment & Plan:   Chronic, stable. Continue current regimen.   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 dilated, EF 35% improved to 45-50% (2015);  b. 07/2015 EF 25-35% by LV gram.   Ischemic cardiomyopathy 01/02/2015   Kidney stones 04/17/2021   Lone atrial fibrillation (HCC) 1983   a. isolated episode, not on OAC.   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 06/2014: LV; resolved with coumadin-->no residual on f/u echo, no longer on coumadin.   Mural thrombus of heart 08/26/2021   Formatting of this note might be different from the original. Jun 16, 2019 Entered By: [name] Comment:  left ventricle, cardiac apex   Osteoarthritis    a. 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 Jun 16, 2019 Entered By: [name] Comment: leftAug 19, 2020 Entered By: [name] Comment: resolved by total left knee replacement Jun 16, 2019 Entered By: [name] Comment: leftAug 19, 2020 Entered By: [name] Comment: resolved by total left knee replacement   Trigger finger of left hand 07/07/2019   Vitamin D deficiency    Past Surgical History:  Procedure Laterality Date   BICEPS TENDON REPAIR Right 1993   CARDIAC CATHETERIZATION N/A 07/05/2015   Procedure: Left Heart Cath and Coronary Angiography;  Surgeon: Antonieta Iba, MD;  Location: [facility] INVASIVE CV LAB;  Service: Cardiovascular;  Laterality: N/A;   CAROTID PTA/STENT INTERVENTION Left 04/26/2023   Procedure: CAROTID PTA/STENT INTERVENTION;  Surgeon: Annice Needy, MD;  Location: [facility] INVASIVE CV LAB;  Service: Cardiovascular;  Laterality: Left;   CATARACT EXTRACTION Left 12/2016   with keratoplasty   COLONOSCOPY  2007   COLONOSCOPY WITH PROPOFOL N/A 12/02/2017   TA, SSA, rpt 3 yrs(Tahiliani, Varnita B, MD)   COLONOSCOPY WITH PROPOFOL N/A 11/26/2020   Procedure: COLONOSCOPY WITH PROPOFOL;  Surgeon: Midge Minium, MD;  Location: [facility] ENDOSCOPY;  Service: Endoscopy;  Laterality: N/A;   CORONARY ARTERY BYPASS GRAFT N/A 07/29/2015   Procedure: CORONARY ARTERY BYPASS GRAFTING (CABG) x 5 (LIMA to LAD, SVG to DIAGONAL,  SVG SEQUENTIALLY to OM1 and OM2, SVG to OM3) with Endoscopic Vein Havesting of  GREATER SAPHENOUS VEIN from RIGHT THIGH and partial LOWER LEG ;  Surgeon: Alleen Borne, MD;  Location: [facility] OR;  Service: Open Heart Surgery;  Laterality: N/A;   EYE SURGERY     b/l cataract and cornea replaced    HAND SURGERY     left hand 1st/2nd trigger fingers Dr. Hyacinth Meeker ortho    JOINT REPLACEMENT     KNEE ARTHROSCOPY Left remote   MOHS SURGERY     left cheek scc 2022 Dr. Jeannine Boga   MOHS  SURGERY     x 5 facial scc   right biceps tendon     repair/re attachment    SKIN CANCER EXCISION  10/2015   BCC - L ala (pending MOHs) and L scapula (complete excision)   TEE WITHOUT CARDIOVERSION N/A 07/29/2015   Procedure: TRANSESOPHAGEAL ECHOCARDIOGRAM (TEE);  Surgeon: Alleen Borne, MD;  Location: [facility] OR;  Service: Open Heart Surgery;  Laterality: N/A;   TONSILLECTOMY  1949   TOTAL KNEE ARTHROPLASTY Left 03/18/2016   cemented L TKR; Deeann Saint, MD   Patient Active Problem List   Diagnosis Date Noted   Annual physical exam 07/10/2023   TIA (transient ischemic attack) 05/02/2023  GERD without esophagitis 05/02/2023   Carotid stenosis, symptomatic, with infarction (HCC) 04/26/2023   History of CVA (cerebrovascular accident) without residual deficits 03/04/2023   Acute CVA (cerebrovascular accident) (HCC) 02/22/2023   Chronic radicular pain of lower back 08/10/2022   Cervical spondylosis 03/11/2022   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   Bilateral carotid artery stenosis 03/11/2022   Lumbar spondylosis 10/07/2021   DDD (degenerative disc disease), lumbar 10/07/2021   History of radiation therapy 08/26/2021   Aortic atherosclerosis (HCC) 04/17/2021   Diverticulosis 04/17/2021   Hypertension associated with diabetes (HCC) 10/07/2020   Overweight (BMI 25.0-29.9) 10/07/2020   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   Vitamin D deficiency 08/01/2020   Polyp of sigmoid colon 08/01/2020   Insomnia 06/07/2020   Gastroesophageal reflux disease 04/04/2020   Degenerative joint disease of hand 03/01/2020   BPH (benign prostatic hyperplasia) 08/05/2018   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   Macular scar of both eyes 04/20/2018   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   Scotoma involving central area of both eyes 04/20/2018   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   Fatty liver 03/25/2018   Erectile dysfunction 02/23/2017   Hx of CABG 01/24/2017   Advanced care planning/counseling  discussion 10/21/2015   Coronary artery disease of native artery of native heart with stable angina pectoris (HCC)    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     From Eval:  Fall at dtr house at [place] on 08/09/2023- Contusion of right hip and rupture of hip abductor tendon. Conservative treatment and patient reports doing better. States leg is weak with some difficulty walking yet no pain.   PERTINENT HISTORY: The patient sustained a fall while visiting family in [place] back in Oct 2024 and pain has improved but reports still feeling week.    PAIN:  Are you having pain? No  PRECAUTIONS: Fall  RED FLAGS: None   WEIGHT BEARING RESTRICTIONS: No  FALLS:  Has patient fallen in last 6 months? Yes. 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  DIAGNOSTIC FINDINGS:  Narrative & Impression  CLINICAL DATA:  Larey Seat 1 month ago.  Persistent right hip pain and difficulty walking.   EXAM: MR OF THE RIGHT HIP WITHOUT CONTRAST   TECHNIQUE: Multiplanar, multisequence MR imaging was performed. No intravenous contrast was administered.   COMPARISON:  None   FINDINGS: Abnormal marrow signal in 3 separate places in the right hemipelvis. 3 cm area of abnormal T1 and T2 signal intensity in the right iliac bone adjacent to  the SI joint without obvious fracture. Second much larger area of abnormal signal intensity in the superior acetabulum with suspected small nondisplaced fractures. Third area involves the ischiopubic junction on the right side with suspected fracture on the axial sequence. Findings are worrisome for underlying bone lesions and pathologic fractures. It is possible these are all traumatic but recommend CT scan for further evaluation and correlation with PSA level with history of prior prostate cancer.   Both hips are normally located. No hip fractures are identified. No bone lesions involving the hips. Age related degenerative changes. No joint effusion.   Bilateral peritrochanteric tendinopathy no tendon rupture or bursitis. The hamstring tendons are intact. Moderate tendinopathy on the left. No muscle tears, myositis or muscle mass. Age related fatty atrophy.   Advanced degenerative changes and scoliosis noted in the lower lumbar spine.   No significant intrapelvic abnormalities are identified. No pelvic or inguinal adenopathy.   IMPRESSION: 1. Abnormal marrow signal in 3 separate places in the right hemipelvis worrisome for underlying bone lesions and pathologic fractures. It is possible these are all traumatic but recommend CT scan for further evaluation and correlation with PSA level with history of prior prostate cancer. 2. No hip fractures are identified. Age related degenerative changes. 3. Bilateral peritrochanteric tendinopathy but no tendon rupture or bursitis. 4. Advanced  degenerative changes and scoliosis in the lower lumbar spine.     Electronically Signed   By: Rudie Meyer M.D.   On: 09/09/2023 16:39    PATIENT SURVEYS:  LEFS 57/80  COGNITION: Overall cognitive status: Within functional limits for tasks assessed     SENSATION: WFL  EDEMA:  None observed   PALPATION: No tenderness noted along lateral right hip  LOWER EXTREMITY ROM:  Active ROM Right eval Left eval  Hip flexion    Hip extension    Hip abduction    Hip adduction    Hip internal rotation    Hip external rotation    Knee flexion    Knee extension    Ankle dorsiflexion    Ankle plantarflexion    Ankle inversion    Ankle eversion     (Blank rows = not tested)  LOWER EXTREMITY MMT:  MMT Right eval Left eval  Hip flexion 4 5  Hip extension 4 5  Hip abduction 2+ 5  Hip adduction 5 5  Hip internal rotation 3+ 5  Hip external rotation 3+ 5  Knee flexion 5 5  Knee extension 5 5  Ankle dorsiflexion 5 5  Ankle plantarflexion    Ankle inversion    Ankle eversion     (Blank rows = not tested)  LOWER EXTREMITY SPECIAL TESTS:  Hip special tests: Trendelenburg test: positive , Thomas test: negative, and Ober's test: negative  FUNCTIONAL TESTS:  5 times sit to stand: 20.01 sec without UE support  10 meter walk test: 0.74 m/s Berg Balance Scale: To be assessed next visit  GAIT: Distance walked: approx 100 feet Assistive device utilized: None Level of assistance: SBA Comments: (+) trendelenburg and shuffling gait with decreased foot clearance  TREATMENT DATE: 12/23/2023  NMR:  Lateral side stepping with resistance- RTB in // bars - no UE support -down and back x 5.  (Patient reports as hard)  Forward/retro walking using agility ladder in // bars x 8 down and back with 3# AW (Increased difficulty with retro step- step to gait  sequencing)   Dynamic Resistive walking using Matrix cable system 12.5 # x 6 forward then 6 backward- (difficulty with retro steps- Increased VC to try to increase step length for more reciprocal steps)    TA:   Step up from floor onto airex pad up onto 6" block then off onto floor- then back up onto 6" block then off onto airex pad then to floor- attempting reciprocal stepping. X 10  (trying to minimize UE support)   Lateral step up onto 6" block then off - then back up on opp side x 15 reps each direction.   Sit to stand x 10 without UE support  at end of session- VC for eccentric control    PATIENT EDUCATION:  Education details: Purpose of PT; Anatomy of Hip; HEP Person educated: Patient Education method: Explanation, Demonstration, Tactile cues, Verbal cues, and Handouts Education comprehension: verbalized understanding, returned demonstration, verbal cues required, tactile cues required, and needs further education  HOME EXERCISE PROGRAM: Access Code: [email or web address] URL: [email or web address] Date: 12/14/2023 Prepared by: Maureen Ralphs  Exercises - Sidelying Hip Abduction  - 3 x weekly - 3 sets - 10 reps - 2 sec hold - Clamshell with Resistance  - 3 x weekly - 3 sets - 10 reps  ASSESSMENT:  CLINICAL IMPRESSION: Treatment continued to target functional hip strength while performing more dynamic activities that focused on hip stabilization. Patient responded well overall- able to work with some ankle resistance and later cable resistance with mobility. He was most challenged with walking backward- lacking hip ext and decreased stance time on Right LE.  Pt will benefit from PT services to address deficits in strength, balance, and mobility in order to return to full function at home with decreased risk of falling.    OBJECTIVE IMPAIRMENTS: Abnormal gait, decreased activity tolerance, decreased balance, decreased coordination, decreased endurance, decreased  knowledge of condition, decreased mobility, difficulty walking, decreased strength, and hypomobility.   ACTIVITY LIMITATIONS: lifting, bending, standing, squatting, stairs, and transfers  PARTICIPATION LIMITATIONS: cleaning, laundry, shopping, community activity, occupation, and yard work  PERSONAL FACTORS: Age and 1-2 comorbidities: CVA, Prostate CA  are also affecting patient's functional outcome.   REHAB POTENTIAL: Good  CLINICAL DECISION MAKING: Evolving/moderate complexity  EVALUATION COMPLEXITY: Moderate   GOALS: Goals reviewed with patient? Yes  SHORT TERM GOALS: Target date: 01/25/2024 Pt will be independent with HEP in order to  increase strength/balance in order to improvefunction at home and work. Baseline: EVAL- No formal HEP in place Goal status: INITIAL   LONG TERM GOALS: Target date: 03/07/2024  Pt will increase LEFS by at least 9 points in order to demonstrate significant improvement in lower extremity function.   Baseline: EVAL= 57/80 Goal status: INITIAL  2.  Patient will demonstrate improved right Hip Abd strength as seen by ability to achieve Full ROM in sidelye position Baseline: EVAL- Minimal able to raise R LE in sidelye Goal status: INITIAL  3.  Patient (> 2 years old) will complete five times sit to stand test in < 15 seconds indicating an increased LE strength and improved balance. Baseline: EVAL= 20.01 sec without UE support Goal status: INITIAL    4.  Patient  will increase Berg Balance score by > 6 points to demonstrate decreased fall risk during functional activities. Baseline: EVAL: to be assessed visit #2; 12/16/2023= 43/56 Goal status: INITIAL    5.   Patient will increase 10 meter walk test to >1.70m/s as to improve gait speed for better community ambulation and to reduce fall risk. Baseline: EVAL= 0.74 Goal status: INITIAL  6.   Patient will increase six minute walk test distance to >1000 for progression to community ambulator and  improve gait ability Baseline: EVAL -to be assessed visit #2; 12/16/2023= 765 feet Goal status: INITIAL   PLAN:  PT FREQUENCY: 1-2x/week  PT DURATION: 12 weeks  PLANNED INTERVENTIONS: 97164- PT Re-evaluation, 97110-Therapeutic exercises, 97530- Therapeutic activity, 97112- Neuromuscular re-education, 97535- Self Care, [id number]- Manual therapy, [phone]- Gait training, [phone]- Orthotic Fit/training, [phone]- Electrical stimulation (manual), Patient/Family education, Balance training, Stair training, Taping, Dry Needling, Joint mobilization, Joint manipulation, Spinal manipulation, Spinal mobilization, DME instructions, Cryotherapy, and Moist heat  PLAN FOR NEXT SESSION:  Continue with progressive therex for Hip strengthening    Lenda Kelp, PT 12/29/2023, 8:36 AM

## 2023-12-30 ENCOUNTER — Ambulatory Visit: Payer: Medicare Other

## 2023-12-30 DIAGNOSIS — M6281 Muscle weakness (generalized): Secondary | ICD-10-CM

## 2023-12-30 DIAGNOSIS — M25552 Pain in left hip: Secondary | ICD-10-CM

## 2023-12-30 DIAGNOSIS — R269 Unspecified abnormalities of gait and mobility: Secondary | ICD-10-CM

## 2023-12-30 DIAGNOSIS — M545 Low back pain, unspecified: Secondary | ICD-10-CM

## 2023-12-30 NOTE — Therapy (Signed)
 OUTPATIENT PHYSICAL THERAPY LOWER EXTREMITY TREATMENT   Patient Name: Shannon Chung MRN: [id number] DOB:Mar 18, 1941, 83 y.o., male Today's Date: 12/30/2023  END OF SESSION:  PT End of Session - 12/30/23 0936     Visit Number 6    Number of Visits 24    Date for PT Re-Evaluation 03/07/24    Progress Note Due on Visit 10    PT Start Time 0930    PT Stop Time 1013    PT Time Calculation (min) 43 min    Equipment Utilized During Treatment Gait belt    Activity Tolerance Patient tolerated treatment well    Behavior During Therapy WFL for tasks assessed/performed                Past Medical History:  Diagnosis Date   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 06/2015 Cardiac CT: Ca score 1103 (84th %'ile);  b. 07/2015 Cath: LM 70, LAD 80p, 100/90m, D1 70, D2 95, RI 75, RCA 100p/m;  c. 07/2015 CABG x 5 (LIMA->LAD, VG->Diag, VG->OM1->OM2, VG->OM3).   COVID-19    12/2021   COVID-19 01/18/2022   COVID-19 vaccine administered 01/18/2022   Unknown how many vaccine doses have been received. Entered from Emergency Triage Note.   Diabetes mellitus without complication (HCC) 07/2015   Dyslipidemia    Essential hypertension    Essential hypertension 01/02/2015   Formatting of this note might be different from the original.  Last Assessment & Plan:   Chronic, stable. Continue current regimen.   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 dilated, EF 35% improved to 45-50% (2015);  b. 07/2015 EF 25-35% by LV gram.   Ischemic cardiomyopathy 01/02/2015   Kidney stones 04/17/2021   Lone atrial fibrillation (HCC) 1983   a. isolated episode, not on OAC.   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 06/2014: LV; resolved with coumadin-->no residual on f/u echo, no longer on coumadin.   Mural thrombus of heart 08/26/2021   Formatting of this note might be different from the original. Jun 16, 2019 Entered By: [name]  Comment: left ventricle, cardiac apex   Osteoarthritis    a. 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 Jun 16, 2019 Entered By: [name] Comment: leftAug 19, 2020 Entered By: [name] Comment: resolved by total left knee replacement Jun 16, 2019 Entered By: [name] Comment: leftAug 19, 2020 Entered By: [name] Comment: resolved by total left knee replacement   Trigger finger of left hand 07/07/2019   Vitamin D deficiency    Past Surgical History:  Procedure Laterality Date   BICEPS TENDON REPAIR Right 1993   CARDIAC CATHETERIZATION N/A 07/05/2015   Procedure: Left Heart Cath and Coronary Angiography;  Surgeon: Antonieta Iba, MD;  Location: [facility] INVASIVE CV LAB;  Service: Cardiovascular;  Laterality: N/A;   CAROTID PTA/STENT INTERVENTION Left 04/26/2023   Procedure: CAROTID PTA/STENT INTERVENTION;  Surgeon: Annice Needy, MD;  Location: [facility] INVASIVE CV LAB;  Service: Cardiovascular;  Laterality: Left;   CATARACT EXTRACTION Left 12/2016   with keratoplasty   COLONOSCOPY  2007   COLONOSCOPY WITH PROPOFOL N/A 12/02/2017   TA, SSA, rpt 3 yrs(Tahiliani, Varnita B, MD)   COLONOSCOPY WITH PROPOFOL N/A 11/26/2020   Procedure: COLONOSCOPY WITH PROPOFOL;  Surgeon: Midge Minium, MD;  Location: [facility] ENDOSCOPY;  Service: Endoscopy;  Laterality: N/A;   CORONARY ARTERY BYPASS GRAFT N/A 07/29/2015   Procedure: CORONARY ARTERY BYPASS GRAFTING (CABG) x 5 (LIMA to LAD, SVG to DIAGONAL,  SVG SEQUENTIALLY to OM1 and OM2, SVG to OM3) with Endoscopic Vein Havesting of  GREATER SAPHENOUS VEIN from RIGHT THIGH and partial LOWER LEG ;  Surgeon: Alleen Borne, MD;  Location: [facility] OR;  Service: Open Heart Surgery;  Laterality: N/A;   EYE SURGERY     b/l cataract and cornea replaced    HAND SURGERY     left hand 1st/2nd trigger fingers Dr. Hyacinth Meeker ortho    JOINT REPLACEMENT     KNEE ARTHROSCOPY Left remote   MOHS SURGERY     left cheek scc 2022 Dr. Jeannine Boga   MOHS  SURGERY     x 5 facial scc   right biceps tendon     repair/re attachment    SKIN CANCER EXCISION  10/2015   BCC - L ala (pending MOHs) and L scapula (complete excision)   TEE WITHOUT CARDIOVERSION N/A 07/29/2015   Procedure: TRANSESOPHAGEAL ECHOCARDIOGRAM (TEE);  Surgeon: Alleen Borne, MD;  Location: [facility] OR;  Service: Open Heart Surgery;  Laterality: N/A;   TONSILLECTOMY  1949   TOTAL KNEE ARTHROPLASTY Left 03/18/2016   cemented L TKR; Deeann Saint, MD   Patient Active Problem List   Diagnosis Date Noted   Annual physical exam 07/10/2023   TIA (transient ischemic attack)  05/02/2023   GERD without esophagitis 05/02/2023   Carotid stenosis, symptomatic, with infarction (HCC) 04/26/2023   History of CVA (cerebrovascular accident) without residual deficits 03/04/2023   Acute CVA (cerebrovascular accident) (HCC) 02/22/2023   Chronic radicular pain of lower back 08/10/2022   Cervical spondylosis 03/11/2022   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   Bilateral carotid artery stenosis 03/11/2022   Lumbar spondylosis 10/07/2021   DDD (degenerative disc disease), lumbar 10/07/2021   History of radiation therapy 08/26/2021   Aortic atherosclerosis (HCC) 04/17/2021   Diverticulosis 04/17/2021   Hypertension associated with diabetes (HCC) 10/07/2020   Overweight (BMI 25.0-29.9) 10/07/2020   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   Vitamin D deficiency 08/01/2020   Polyp of sigmoid colon 08/01/2020   Insomnia 06/07/2020   Gastroesophageal reflux disease 04/04/2020   Degenerative joint disease of hand 03/01/2020   BPH (benign prostatic hyperplasia) 08/05/2018   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   Macular scar of both eyes 04/20/2018   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   Scotoma involving central area of both eyes 04/20/2018   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   Fatty liver 03/25/2018   Erectile dysfunction 02/23/2017   Hx of CABG 01/24/2017   Advanced care planning/counseling  discussion 10/21/2015   Coronary artery disease of native artery of native heart with stable angina pectoris (HCC)    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   Dyslipidemia 01/02/2015   Osteoarthritis 01/02/2015   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/02/2014    PCP: Dana Allan, MD  REFERRING PROVIDER: Kennedy Bucker, MD  REFERRING DIAG:  S76.019A (ICD-10-CM) - Rupture of hip abductor tendon  S70.01XA (ICD-10-CM) - Contusion of right hip    THERAPY DIAG:  Abnormality of gait and mobility  Muscle weakness (generalized)  Bilateral low back pain without sciatica, unspecified chronicity  Pain in left hip  Rationale for Evaluation and Treatment: Rehabilitation  ONSET DATE: 08/09/2023  SUBJECTIVE:   SUBJECTIVE STATEMENT: Patient reports was fatigued immediately after last visit but states no lasting soreness and doing well.     From Eval:  Fall at dtr house at steps on 08/09/2023- Contusion of right hip and rupture of hip abductor tendon. Conservative treatment and patient reports doing better. States leg is weak with some difficulty walking yet no pain.   PERTINENT HISTORY: The patient sustained a fall while visiting family in [place] back in Oct 2024 and pain has improved but reports still feeling week.    PAIN:  Are you having pain? No  PRECAUTIONS: Fall  RED FLAGS: None   WEIGHT BEARING RESTRICTIONS: No  FALLS:  Has patient fallen in last 6 months? Yes. 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  DIAGNOSTIC FINDINGS:   Narrative & Impression  CLINICAL DATA:  Larey Seat 1 month ago. Persistent right hip pain and difficulty walking.   EXAM: MR OF THE RIGHT HIP WITHOUT CONTRAST   TECHNIQUE: Multiplanar, multisequence MR imaging was performed. No intravenous contrast was administered.   COMPARISON:  None   FINDINGS: Abnormal marrow signal in 3 separate places in the right hemipelvis. 3 cm area of abnormal  T1 and T2 signal intensity in the right iliac bone adjacent to the SI joint without obvious fracture. Second much larger area of abnormal signal intensity in the superior acetabulum with suspected small nondisplaced fractures. Third area involves the ischiopubic junction on the right side with suspected fracture on the axial sequence. Findings are worrisome for underlying bone lesions and pathologic fractures. It is possible these are all traumatic but recommend CT scan for further evaluation and correlation with PSA level with history of prior prostate cancer.   Both hips are normally located. No hip fractures are identified. No bone lesions involving the hips. Age related degenerative changes. No joint effusion.   Bilateral peritrochanteric tendinopathy no tendon rupture or bursitis. The hamstring tendons are intact. Moderate tendinopathy on the left. No muscle tears, myositis or muscle mass. Age related fatty atrophy.   Advanced degenerative changes and scoliosis noted in the lower lumbar spine.   No significant intrapelvic abnormalities are identified. No pelvic or inguinal adenopathy.   IMPRESSION: 1. Abnormal marrow signal in 3 separate places in the right hemipelvis worrisome for underlying bone lesions and pathologic fractures. It is possible these are all traumatic but recommend CT scan for further evaluation and correlation with PSA level with history of prior prostate cancer. 2. No hip fractures are identified. Age related degenerative changes. 3. Bilateral peritrochanteric  tendinopathy but no tendon rupture or bursitis. 4. Advanced degenerative changes and scoliosis in the lower lumbar spine.     Electronically Signed   By: Rudie Meyer M.D.   On: 09/09/2023 16:39    PATIENT SURVEYS:  LEFS 57/80  COGNITION: Overall cognitive status: Within functional limits for tasks assessed     SENSATION: WFL  EDEMA:  None observed   PALPATION: No tenderness noted along lateral right hip  LOWER EXTREMITY ROM:  Active ROM Right eval Left eval  Hip flexion    Hip extension    Hip abduction    Hip adduction    Hip internal rotation    Hip external rotation    Knee flexion    Knee extension    Ankle dorsiflexion    Ankle plantarflexion    Ankle inversion    Ankle eversion     (Blank rows = not tested)  LOWER EXTREMITY MMT:  MMT Right eval Left eval  Hip flexion 4 5  Hip extension 4 5  Hip abduction 2+ 5  Hip adduction 5 5  Hip internal rotation 3+ 5  Hip external rotation 3+ 5  Knee flexion 5 5  Knee extension 5 5  Ankle dorsiflexion 5 5  Ankle plantarflexion    Ankle inversion    Ankle eversion     (Blank rows = not tested)  LOWER EXTREMITY SPECIAL TESTS:  Hip special tests: Trendelenburg test: positive , Thomas test: negative, and Ober's test: negative  FUNCTIONAL TESTS:  5 times sit to stand: 20.01 sec without UE support  10 meter walk test: 0.74 m/s Berg Balance Scale: To be assessed next visit  GAIT: Distance walked: approx 100 feet Assistive device utilized: None Level of assistance: SBA Comments: (+) trendelenburg and shuffling gait with decreased foot clearance  TREATMENT DATE: 12/30/2023  NMR:   Forward/retro walking approx 14 (counting steps to attempt to take the least amount of total steps possible) - x 5 trials wearing 3# AW ea 1) 8 steps forward 15 bwd 2) 8 steps fwd, 11 bwd 3) 8  steps fwd, 10 bwd 4) 8 steps fwd, 11 bwd 5) 8 steps fwd, 11 bwd   Step tap onto 1st step  x 10 reps 3# alt LE without UE support  Step tap onto 2nd step at stairs 3# alt LE without UE support   TA:   Step up onto 1 step - forward - x 12 reps alt LE  Lateral step up onto 1st step x 12 each side  Sit to stand x 10 without UE support  with airex pad under left LE  VC for eccentric control  THEREX:  Seated hip flex/abd/add up over 1/2 foam 3# AW 2 x 10  (only attained 6 reps on 2nd set with Right side)   Seated precor leg press machine- 40#  3 sets of 10 reps (patient reports as medium)     PATIENT EDUCATION:  Education details: Purpose of PT; Anatomy of Hip; HEP Person educated: Patient Education method: Explanation, Demonstration, Tactile cues, Verbal cues, and Handouts Education comprehension: verbalized understanding, returned demonstration, verbal cues required, tactile cues required, and needs further education  HOME EXERCISE PROGRAM: Access Code: [email or web address] URL: [email or web address] Date: 12/14/2023 Prepared by: Maureen Ralphs  Exercises - Sidelying Hip Abduction  - 3 x weekly - 3 sets - 10 reps - 2 sec hold - Clamshell with Resistance  - 3 x weekly - 3 sets - 10 reps  ASSESSMENT:  CLINICAL IMPRESSION: Patient continues to respond positively to added resistance and weight bearing activities with improving step length.  Treatment continued to target functional hip strength while performing more dynamic activities that focused on hip stabilization. He struggled some with seated hip exercise- significantly weaker on right LE vs. Left but performed better with standing functional activities with no hip dip today.   Pt will benefit from PT services to address deficits in strength, balance, and mobility in order to return to full function at home with decreased risk of falling.    OBJECTIVE IMPAIRMENTS: Abnormal gait, decreased activity tolerance, decreased  balance, decreased coordination, decreased endurance, decreased knowledge of condition, decreased mobility, difficulty walking, decreased strength, and hypomobility.   ACTIVITY LIMITATIONS: lifting, bending, standing, squatting, stairs, and transfers  PARTICIPATION LIMITATIONS: cleaning, laundry, shopping, community activity, occupation, and yard work  PERSONAL FACTORS: Age and 1-2 comorbidities: CVA, Prostate CA  are also affecting patient's functional outcome.   REHAB POTENTIAL: Good  CLINICAL DECISION MAKING: Evolving/moderate complexity  EVALUATION COMPLEXITY: Moderate   GOALS: Goals reviewed with patient? Yes  SHORT TERM GOALS: Target date: 01/25/2024 Pt will be independent with HEP in order to  increase strength/balance in order to improvefunction at home and work. Baseline: EVAL- No formal HEP in place Goal status: INITIAL   LONG TERM GOALS: Target date: 03/07/2024  Pt will increase LEFS by at least 9 points in order to demonstrate significant improvement in lower extremity function.   Baseline: EVAL= 57/80 Goal status: INITIAL  2.  Patient will demonstrate improved right Hip Abd strength as seen by ability to achieve Full ROM in sidelye position Baseline: EVAL- Minimal able to raise R LE in sidelye Goal status: INITIAL  3.  Patient (> 73 years old) will complete five times sit to stand test in < 15 seconds indicating  an increased LE strength and improved balance. Baseline: EVAL= 20.01 sec without UE support Goal status: INITIAL    4.  Patient will increase Berg Balance score by > 6 points to demonstrate decreased fall risk during functional activities. Baseline: EVAL: to be assessed visit #2; 12/16/2023= 43/56 Goal status: INITIAL    5.   Patient will increase 10 meter walk test to >1.77m/s as to improve gait speed for better community ambulation and to reduce fall risk. Baseline: EVAL= 0.74 Goal status: INITIAL  6.   Patient will increase six minute walk test  distance to >1000 for progression to community ambulator and improve gait ability Baseline: EVAL -to be assessed visit #2; 12/16/2023= 765 feet Goal status: INITIAL   PLAN:  PT FREQUENCY: 1-2x/week  PT DURATION: 12 weeks  PLANNED INTERVENTIONS: [id number]- PT Re-evaluation, 97110-Therapeutic exercises, 97530- Therapeutic activity, 97112- Neuromuscular re-education, 97535- Self Care, 40981- Manual therapy, [phone]- Gait training, [phone]- Orthotic Fit/training, [phone]- Electrical stimulation (manual), Patient/Family education, Balance training, Stair training, Taping, Dry Needling, Joint mobilization, Joint manipulation, Spinal manipulation, Spinal mobilization, DME instructions, Cryotherapy, and Moist heat  PLAN FOR NEXT SESSION:  Continue with progressive therex for Hip strengthening    Lenda Kelp, PT 12/30/2023, 10:16 AM

## 2024-01-02 ENCOUNTER — Encounter: Payer: Self-pay | Admitting: Family Medicine

## 2024-01-02 NOTE — Assessment & Plan Note (Signed)
 Patient reports feeling well and has been released to drive and return to work by neurologist. Currently undergoing physical therapy with noted improvement in strength. -Continue Keppra as directed by neurologist. -Continue physical therapy as planned. -Review neurologist's notes and provide work release note for patient to return to work.  Messaged Neurology, ok from neurology's view to return to work.

## 2024-01-02 NOTE — Assessment & Plan Note (Signed)
 Well controlled with home glucose readings in the 90s and A1c of 6.1. -Continue current medication regimen. -Check kidney function today (last checked 6 months ago).

## 2024-01-02 NOTE — Assessment & Plan Note (Signed)
 Well controlled, no changes in medication needed. -Continue Metoprolol XL 25 mg daily -Continue Lisinopril 10 mg daily -Check Cmet

## 2024-01-04 ENCOUNTER — Ambulatory Visit: Payer: Medicare Other | Attending: Orthopedic Surgery

## 2024-01-04 ENCOUNTER — Encounter: Payer: Self-pay | Admitting: Diagnostic Neuroimaging

## 2024-01-04 DIAGNOSIS — M545 Low back pain, unspecified: Secondary | ICD-10-CM | POA: Insufficient documentation

## 2024-01-04 DIAGNOSIS — M25552 Pain in left hip: Secondary | ICD-10-CM | POA: Insufficient documentation

## 2024-01-04 DIAGNOSIS — M6281 Muscle weakness (generalized): Secondary | ICD-10-CM | POA: Diagnosis present

## 2024-01-04 DIAGNOSIS — R269 Unspecified abnormalities of gait and mobility: Secondary | ICD-10-CM | POA: Diagnosis present

## 2024-01-04 NOTE — Therapy (Signed)
 OUTPATIENT PHYSICAL THERAPY LOWER EXTREMITY TREATMENT   Patient Name: Shannon Chung MRN: [id number] DOB:1941/04/30, 83 y.o., male Today's Date: 01/04/2024  END OF SESSION:  PT End of Session - 01/04/24 1440     Visit Number 7    Number of Visits 24    Date for PT Re-Evaluation 03/07/24    Progress Note Due on Visit 10    PT Start Time 1440    PT Stop Time 1525    PT Time Calculation (min) 45 min    Equipment Utilized During Treatment Gait belt    Activity Tolerance Patient tolerated treatment well    Behavior During Therapy WFL for tasks assessed/performed                 Past Medical History:  Diagnosis Date   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 06/2015 Cardiac CT: Ca score 1103 (84th %'ile);  b. 07/2015 Cath: LM 70, LAD 80p, 100/79m, D1 70, D2 95, RI 75, RCA 100p/m;  c. 07/2015 CABG x 5 (LIMA->LAD, VG->Diag, VG->OM1->OM2, VG->OM3).   COVID-19    12/2021   COVID-19 01/18/2022   COVID-19 vaccine administered 01/18/2022   Unknown how many vaccine doses have been received. Entered from Emergency Triage Note.   Diabetes mellitus without complication (HCC) 07/2015   Dyslipidemia    Essential hypertension    Essential hypertension 01/02/2015   Formatting of this note might be different from the original.  Last Assessment & Plan:   Chronic, stable. Continue current regimen.   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 dilated, EF 35% improved to 45-50% (2015);  b. 07/2015 EF 25-35% by LV gram.   Ischemic cardiomyopathy 01/02/2015   Kidney stones 04/17/2021   Lone atrial fibrillation (HCC) 1983   a. isolated episode, not on OAC.   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 06/2014: LV; resolved with coumadin-->no residual on f/u echo, no longer on coumadin.   Mural thrombus of heart 08/26/2021   Formatting of this note might be different from the original. Jun 16, 2019 Entered By: Vinnie Level  DENISE Comment: left ventricle, cardiac apex   Osteoarthritis    a. 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 Jun 16, 2019 Entered By: [name] Comment: leftAug 19, 2020 Entered By: [name] Comment: resolved by total left knee replacement Jun 16, 2019 Entered By: [name] Comment: leftAug 19, 2020 Entered By: [name] Comment: resolved by total left knee replacement   Trigger finger of left hand 07/07/2019   Vitamin D deficiency    Past Surgical History:  Procedure Laterality Date   BICEPS TENDON REPAIR Right 1993   CARDIAC CATHETERIZATION N/A 07/05/2015   Procedure: Left Heart Cath and Coronary Angiography;  Surgeon: Antonieta Iba, MD;  Location: [facility] INVASIVE CV LAB;  Service: Cardiovascular;  Laterality: N/A;   CAROTID PTA/STENT INTERVENTION Left 04/26/2023   Procedure: CAROTID PTA/STENT INTERVENTION;  Surgeon: Annice Needy, MD;  Location: [facility] INVASIVE CV LAB;  Service: Cardiovascular;  Laterality: Left;   CATARACT EXTRACTION Left 12/2016   with keratoplasty   COLONOSCOPY  2007   COLONOSCOPY WITH PROPOFOL N/A 12/02/2017   TA, SSA, rpt 3 yrs(Tahiliani, Varnita B, MD)   COLONOSCOPY WITH PROPOFOL N/A 11/26/2020   Procedure: COLONOSCOPY WITH PROPOFOL;  Surgeon: Midge Minium, MD;  Location: [facility] ENDOSCOPY;  Service: Endoscopy;  Laterality: N/A;   CORONARY ARTERY BYPASS GRAFT N/A 07/29/2015   Procedure: CORONARY ARTERY BYPASS GRAFTING (CABG) x 5 (LIMA to LAD, SVG to DIAGONAL,  SVG SEQUENTIALLY to OM1 and OM2, SVG to OM3) with Endoscopic Vein Havesting of  GREATER SAPHENOUS VEIN from RIGHT THIGH and partial LOWER LEG ;  Surgeon: Alleen Borne, MD;  Location: [facility] OR;  Service: Open Heart Surgery;  Laterality: N/A;   EYE SURGERY     b/l cataract and cornea replaced    HAND SURGERY     left hand 1st/2nd trigger fingers Dr. [facility]ac[facility] Meeker ortho    JOINT REPLACEMENT     KNEE ARTHROSCOPY Left remote   MOHS SURGERY     left cheek scc 2022 Dr. Jeannine Boga   MOHS  SURGERY     x 5 facial scc   right biceps tendon     repair/re attachment    SKIN CANCER EXCISION  10/2015   BCC - L ala (pending MOHs) and L scapula (complete excision)   TEE WITHOUT CARDIOVERSION N/A 07/29/2015   Procedure: TRANSESOPHAGEAL ECHOCARDIOGRAM (TEE);  Surgeon: Alleen Borne, MD;  Location: [facility] OR;  Service: Open Heart Surgery;  Laterality: N/A;   TONSILLECTOMY  1949   TOTAL KNEE ARTHROPLASTY Left 03/18/2016   cemented L TKR; Deeann Saint, MD   Patient Active Problem List   Diagnosis Date Noted   Annual physical exam 07/10/2023   TIA (transient ischemic  attack) 05/02/2023   GERD without esophagitis 05/02/2023   Carotid stenosis, symptomatic, with infarction (HCC) 04/26/2023   History of CVA (cerebrovascular accident) without residual deficits 03/04/2023   Acute CVA (cerebrovascular accident) (HCC) 02/22/2023   Chronic radicular pain of lower back 08/10/2022   Cervical spondylosis 03/11/2022   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   Bilateral carotid artery stenosis 03/11/2022   Lumbar spondylosis 10/07/2021   DDD (degenerative disc disease), lumbar 10/07/2021   History of radiation therapy 08/26/2021   Aortic atherosclerosis (HCC) 04/17/2021   Diverticulosis 04/17/2021   Hypertension associated with diabetes (HCC) 10/07/2020   Overweight (BMI 25.0-29.9) 10/07/2020   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   Vitamin D deficiency 08/01/2020   Polyp of sigmoid colon 08/01/2020   Insomnia 06/07/2020   Gastroesophageal reflux disease 04/04/2020   Degenerative joint disease of hand 03/01/2020   BPH (benign prostatic hyperplasia) 08/05/2018   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   Macular scar of both eyes 04/20/2018   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   Scotoma involving central area of both eyes 04/20/2018   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   Fatty liver 03/25/2018   Erectile dysfunction 02/23/2017   Hx of CABG 01/24/2017   Advanced care planning/counseling  discussion 10/21/2015   Coronary artery disease of native artery of native heart with stable angina pectoris (HCC)    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 States may finish out his PT sessions prior to going back to [employer] work.    From Eval:  Fall at dtr house at steps on 08/09/2023- Contusion of right hip and rupture of hip abductor tendon. Conservative treatment and patient reports doing better. States leg is weak with some difficulty walking yet no pain.   PERTINENT HISTORY: The patient sustained a fall while visiting family in [place] back in Oct 2024 and pain has improved but reports still feeling week.    PAIN:  Are you having pain? No  PRECAUTIONS: Fall  RED FLAGS: None   WEIGHT BEARING RESTRICTIONS: No  FALLS:  Has patient fallen in last 6 months? Yes. 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  DIAGNOSTIC FINDINGS:  Narrative & Impression  CLINICAL DATA:  Larey Seat 1 month ago. Persistent right hip pain  and difficulty walking.   EXAM: MR OF THE RIGHT HIP WITHOUT CONTRAST   TECHNIQUE: Multiplanar, multisequence MR imaging was performed. No intravenous contrast was administered.   COMPARISON:  None   FINDINGS: Abnormal marrow signal in 3 separate places in the right hemipelvis. 3 cm area of abnormal T1 and T2 signal intensity in the right iliac bone adjacent to  the SI joint without obvious fracture. Second much larger area of abnormal signal intensity in the superior acetabulum with suspected small nondisplaced fractures. Third area involves the ischiopubic junction on the right side with suspected fracture on the axial sequence. Findings are worrisome for underlying bone lesions and pathologic fractures. It is possible these are all traumatic but recommend CT scan for further evaluation and correlation with PSA level with history of prior prostate cancer.   Both hips are normally located. No hip fractures are identified. No bone lesions involving the hips. Age related degenerative changes. No joint effusion.   Bilateral peritrochanteric tendinopathy no tendon rupture or bursitis. The hamstring tendons are intact. Moderate tendinopathy on the left. No muscle tears, myositis or muscle mass. Age related fatty atrophy.   Advanced degenerative changes and scoliosis noted in the lower lumbar spine.   No significant intrapelvic abnormalities are identified. No pelvic or inguinal adenopathy.   IMPRESSION: 1. Abnormal marrow signal in 3 separate places in the right hemipelvis worrisome for underlying bone lesions and pathologic fractures. It is possible these are all traumatic but recommend CT scan for further evaluation and correlation with PSA level with history of prior prostate cancer. 2. No hip fractures are identified. Age related degenerative changes. 3. Bilateral peritrochanteric tendinopathy but no tendon rupture or bursitis. 4. Advanced degenerative changes and  scoliosis in the lower lumbar spine.     Electronically Signed   By: Rudie Meyer M.D.   On: 09/09/2023 16:39    PATIENT SURVEYS:  LEFS 57/80  COGNITION: Overall cognitive status: Within functional limits for tasks assessed     SENSATION: WFL  EDEMA:  None observed   PALPATION: No tenderness noted along lateral right hip  LOWER EXTREMITY ROM:  Active ROM Right eval Left eval  Hip flexion    Hip extension    Hip abduction    Hip adduction    Hip internal rotation    Hip external rotation    Knee flexion    Knee extension    Ankle dorsiflexion    Ankle plantarflexion    Ankle inversion    Ankle eversion     (Blank rows = not tested)  LOWER EXTREMITY MMT:  MMT Right eval Left eval  Hip flexion 4 5  Hip extension 4 5  Hip abduction 2+ 5  Hip adduction 5 5  Hip internal rotation 3+ 5  Hip external rotation 3+ 5  Knee flexion 5 5  Knee extension 5 5  Ankle dorsiflexion 5 5  Ankle plantarflexion    Ankle inversion    Ankle eversion     (Blank rows = not tested)  LOWER EXTREMITY SPECIAL TESTS:  Hip special tests: Trendelenburg test: positive , Thomas test: negative, and Ober's test: negative  FUNCTIONAL TESTS:  5 times sit to stand: 20.01 sec without UE support  10 meter walk test: 0.74 m/s Berg Balance Scale: To be assessed next visit  GAIT: Distance walked: approx 100 feet Assistive device utilized: None Level of assistance: SBA Comments: (+) trendelenburg and shuffling gait with decreased foot clearance  TREATMENT DATE: 01/04/2024  NMR:   Standing forward step up/over orange hurdle 3# AW - 2 x 10   Resistive gait with 3# AW- 10 Meter distance- counting steps with 3# x 4 trials- each trial between 18-20 steps and between 9- 10.3 sec  Ambulation without UE support - 10 meter distance - counting steps- x 4 trials.  Each trial between 17-18 steps and time 9.1-9.57 sec    TA:   Step up onto 1 step - forward - x 12 reps alt LE   Sit to stand x 10 without UE support  with airex pad under left LE  VC for eccentric control  THEREX:   Seated hip flex 3# AW 2x15  Seated Knee ext with 3# AW 2 x 10           PATIENT EDUCATION:  Education details: Purpose of PT; Anatomy of Hip; HEP Person educated: Patient Education method: Explanation, Demonstration, Tactile cues, Verbal cues, and Handouts Education comprehension: verbalized understanding, returned demonstration, verbal cues required, tactile cues required, and needs further education  HOME EXERCISE PROGRAM: Access Code: [email or web address] URL: [email or web address] Date: 12/14/2023 Prepared by: Maureen Ralphs  Exercises - Sidelying Hip Abduction  - 3 x weekly - 3 sets - 10 reps - 2 sec hold - Clamshell with Resistance  - 3 x weekly - 3 sets - 10 reps  ASSESSMENT:  CLINICAL IMPRESSION: Patient demonstrated some weakness with right quads with knee ext with resistance and later some difficulty with right LE foot clearance up/over orange hurdle. He continues to present with excellent motivation and overall progressing well- able to demonstrate some in session progress with improved stride/step length with resistance and later without resistance.   Pt will benefit from PT services to address deficits in strength, balance, and mobility in order to return to full function at home with decreased risk of falling.    OBJECTIVE IMPAIRMENTS: Abnormal gait, decreased activity tolerance, decreased balance, decreased coordination, decreased endurance, decreased knowledge of condition, decreased mobility, difficulty walking, decreased strength, and hypomobility.   ACTIVITY LIMITATIONS: lifting, bending, standing, squatting, stairs, and transfers  PARTICIPATION LIMITATIONS: cleaning, laundry, shopping, community activity, occupation, and yard  work  PERSONAL FACTORS: Age and 1-2 comorbidities: CVA, Prostate CA  are also affecting patient's functional outcome.   REHAB POTENTIAL: Good  CLINICAL DECISION MAKING: Evolving/moderate complexity  EVALUATION COMPLEXITY: Moderate   GOALS: Goals reviewed with patient? Yes  SHORT TERM GOALS: Target date: 01/25/2024 Pt will be independent with HEP in order to  increase strength/balance in order to improvefunction at home and work. Baseline: EVAL- No formal HEP in place Goal status: INITIAL   LONG TERM GOALS: Target date: 03/07/2024  Pt will increase LEFS by at least 9 points in order to demonstrate significant improvement in lower extremity function.   Baseline: EVAL= 57/80 Goal status: INITIAL  2.  Patient will demonstrate improved right Hip Abd strength as seen by ability to achieve Full ROM in sidelye position Baseline: EVAL- Minimal able to raise R LE in sidelye Goal status: INITIAL  3.  Patient (> 1 years old) will complete five times sit to stand test in < 15 seconds indicating an increased LE strength and improved balance. Baseline: EVAL= 20.01 sec without UE support Goal status: INITIAL    4.  Patient will increase Berg Balance score by > 6 points to demonstrate decreased fall risk during functional activities. Baseline: EVAL: to be assessed visit #2; 12/16/2023= 43/56 Goal status: INITIAL    5.  Patient will increase 10 meter walk test to >1.76m/s as to improve gait speed for better community ambulation and to reduce fall risk. Baseline: EVAL= 0.74 Goal status: INITIAL  6.   Patient will increase six minute walk test distance to >1000 for progression to community ambulator and improve gait ability Baseline: EVAL -to be assessed visit #2; 12/16/2023= 765 feet Goal status: INITIAL   PLAN:  PT FREQUENCY: 1-2x/week  PT DURATION: 12 weeks  PLANNED INTERVENTIONS: 97164- PT Re-evaluation, 97110-Therapeutic exercises, 97530- Therapeutic activity, 97112-  Neuromuscular re-education, 97535- Self Care, 16109- Manual therapy, [phone]- Gait training, [phone]- Orthotic Fit/training, [phone]- Electrical stimulation (manual), Patient/Family education, Balance training, Stair training, Taping, Dry Needling, Joint mobilization, Joint manipulation, Spinal manipulation, Spinal mobilization, DME instructions, Cryotherapy, and Moist heat  PLAN FOR NEXT SESSION:  Continue with progressive therex for Hip strengthening    Lenda Kelp, PT 01/04/2024, 5:24 PM

## 2024-01-05 ENCOUNTER — Encounter: Payer: Self-pay | Admitting: Neurology

## 2024-01-05 NOTE — Telephone Encounter (Signed)
 Letter written and placed in [facility] office for review and to sign

## 2024-01-05 NOTE — Telephone Encounter (Addendum)
 Patient ask letter to return to work emailed to [email or web address]

## 2024-01-06 ENCOUNTER — Ambulatory Visit: Payer: Medicare Other

## 2024-01-06 DIAGNOSIS — M6281 Muscle weakness (generalized): Secondary | ICD-10-CM

## 2024-01-06 DIAGNOSIS — R269 Unspecified abnormalities of gait and mobility: Secondary | ICD-10-CM

## 2024-01-06 NOTE — Therapy (Signed)
 OUTPATIENT PHYSICAL THERAPY LOWER EXTREMITY TREATMENT   Patient Name: Shannon Chung MRN: [id number] DOB:Jan 14, 1941, 83 y.o., male Today's Date: 01/07/2024  END OF SESSION:  PT End of Session - 01/06/24 1619     Visit Number 8    Number of Visits 24    Date for PT Re-Evaluation 03/07/24    Progress Note Due on Visit 10    PT Start Time 1615    PT Stop Time 1658    PT Time Calculation (min) 43 min    Equipment Utilized During Treatment Gait belt    Activity Tolerance Patient tolerated treatment well    Behavior During Therapy WFL for tasks assessed/performed                  Past Medical History:  Diagnosis Date   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 06/2015 Cardiac CT: Ca score 1103 (84th %'ile);  b. 07/2015 Cath: LM 70, LAD 80p, 100/24m, D1 70, D2 95, RI 75, RCA 100p/m;  c. 07/2015 CABG x 5 (LIMA->LAD, VG->Diag, VG->OM1->OM2, VG->OM3).   COVID-19    12/2021   COVID-19 01/18/2022   COVID-19 vaccine administered 01/18/2022   Unknown how many vaccine doses have been received. Entered from Emergency Triage Note.   Diabetes mellitus without complication (HCC) 07/2015   Dyslipidemia    Essential hypertension    Essential hypertension 01/02/2015   Formatting of this note might be different from the original.  Last Assessment & Plan:   Chronic, stable. Continue current regimen.   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 dilated, EF 35% improved to 45-50% (2015);  b. 07/2015 EF 25-35% by LV gram.   Ischemic cardiomyopathy 01/02/2015   Kidney stones 04/17/2021   Lone atrial fibrillation (HCC) 1983   a. isolated episode, not on OAC.   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 06/2014: LV; resolved with coumadin-->no residual on f/u echo, no longer on coumadin.   Mural thrombus of heart 08/26/2021   Formatting of this note might be different from the original. Jun 16, 2019 Entered By:  HICKS,KRISTIN DENISE Comment: left ventricle, cardiac apex   Osteoarthritis    a. 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   Strain of muscle of right hip 08/28/2019   Stroke (HCC)    Thrombocytopenia (HCC)    Thrombocytopenia (HCC)  02/14/2018   Torn medial meniscus 08/26/2021   Formatting of this note might be different from the original. Jun 16, 2019 Entered By: [name] Comment: leftAug 19, 2020 Entered By: [name] Comment: resolved by total left knee replacement Jun 16, 2019 Entered By: [name] Comment: leftAug 19, 2020 Entered By: [name] Comment: resolved by total left knee replacement   Trigger finger of left hand 07/07/2019   Vitamin D deficiency    Past Surgical History:  Procedure Laterality Date   BICEPS TENDON REPAIR Right 1993   CARDIAC CATHETERIZATION N/A 07/05/2015   Procedure: Left Heart Cath and Coronary Angiography;  Surgeon: Antonieta Iba, MD;  Location: [facility] INVASIVE CV LAB;  Service: Cardiovascular;  Laterality: N/A;   CAROTID PTA/STENT INTERVENTION Left 04/26/2023   Procedure: CAROTID PTA/STENT INTERVENTION;  Surgeon: Annice Needy, MD;  Location: [facility] INVASIVE CV LAB;  Service: Cardiovascular;  Laterality: Left;   CATARACT EXTRACTION Left 12/2016   with keratoplasty   COLONOSCOPY  2007   COLONOSCOPY WITH PROPOFOL N/A 12/02/2017   TA, SSA, rpt 3 yrs(Tahiliani, Varnita B, MD)   COLONOSCOPY WITH PROPOFOL N/A 11/26/2020   Procedure: COLONOSCOPY WITH PROPOFOL;  Surgeon: Midge Minium, MD;  Location[facility] ENDOSCOPY;  Service: Endoscopy;  Laterality: N/A;   CORONARY ARTERY BYPASS GRAFT N/A 07/29/2015   Procedure: CORONARY ARTERY BYPASS GRAFTING (CABG) x 5 (LIMA to LAD, SVG to DIAGONAL,  SVG SEQUENTIALLY to OM1 and OM2, SVG to OM3) with Endoscopic Vein Havesting of  GREATER SAPHENOUS VEIN from RIGHT THIGH and partial LOWER LEG ;  Surgeon: Alleen Borne, MD;  Location: [facility] OR;  Service: Open Heart Surgery;  Laterality: N/A;   EYE SURGERY     b/l cataract and cornea replaced    HAND SURGERY     left hand 1st/2nd trigger fingers Dr. Hyacinth Meeker ortho    JOINT REPLACEMENT     KNEE ARTHROSCOPY Left remote   MOHS SURGERY     left cheek scc 2022 Dr. Jeannine Boga   MOHS  SURGERY     x 5 facial scc   right biceps tendon     repair/re attachment    SKIN CANCER EXCISION  10/2015   BCC - L ala (pending MOHs) and L scapula (complete excision)   TEE WITHOUT CARDIOVERSION N/A 07/29/2015   Procedure: TRANSESOPHAGEAL ECHOCARDIOGRAM (TEE);  Surgeon: Alleen Borne, MD;  Location: [facility] OR;  Service: Open Heart Surgery;  Laterality: N/A;   TONSILLECTOMY  1949   TOTAL KNEE ARTHROPLASTY Left 03/18/2016   cemented L TKR; Deeann Saint, MD   Patient Active Problem List   Diagnosis Date Noted   Annual physical exam 07/10/2023   TIA (transient  ischemic attack) 05/02/2023   GERD without esophagitis 05/02/2023   Carotid stenosis, symptomatic, with infarction (HCC) 04/26/2023   History of CVA (cerebrovascular accident) without residual deficits 03/04/2023   Acute CVA (cerebrovascular accident) (HCC) 02/22/2023   Chronic radicular pain of lower back 08/10/2022   Cervical spondylosis 03/11/2022   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   Bilateral carotid artery stenosis 03/11/2022   Lumbar spondylosis 10/07/2021   DDD (degenerative disc disease), lumbar 10/07/2021   History of radiation therapy 08/26/2021   Aortic atherosclerosis (HCC) 04/17/2021   Diverticulosis 04/17/2021   Hypertension associated with diabetes (HCC) 10/07/2020   Overweight (BMI 25.0-29.9) 10/07/2020   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   Vitamin D deficiency 08/01/2020   Polyp of sigmoid colon 08/01/2020   Insomnia 06/07/2020   Gastroesophageal reflux disease 04/04/2020   Degenerative joint disease of hand 03/01/2020   BPH (benign prostatic hyperplasia) 08/05/2018   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   Macular scar of both eyes 04/20/2018   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   Scotoma involving central area of both eyes 04/20/2018   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   Fatty liver 03/25/2018   Erectile dysfunction 02/23/2017   Hx of CABG 01/24/2017   Advanced care planning/counseling  discussion 10/21/2015   Coronary artery disease of native artery of native heart with stable angina pectoris (HCC)    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     From Eval:  Fall at dtr house at [place] on 08/09/2023- Contusion of right hip and rupture of hip abductor tendon. Conservative treatment and patient reports doing better. States leg is weak with some difficulty walking yet no pain.   PERTINENT HISTORY: The patient sustained a fall while visiting family in [place] back in Oct 2024 and pain has improved but reports still feeling week.    PAIN:  Are you having pain? No  PRECAUTIONS: Fall  RED FLAGS: None   WEIGHT BEARING RESTRICTIONS: No  FALLS:  Has patient fallen in last 6 months? Yes. 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  DIAGNOSTIC FINDINGS:  Narrative & Impression  CLINICAL DATA:  Larey Seat 1 month ago. Persistent right hip pain and difficulty walking.    EXAM: MR OF THE RIGHT HIP WITHOUT CONTRAST   TECHNIQUE: Multiplanar, multisequence MR imaging was performed. No intravenous contrast was administered.   COMPARISON:  None   FINDINGS: Abnormal marrow signal in 3 separate places in the right hemipelvis. 3 cm area of abnormal T1 and T2 signal intensity in the right iliac bone adjacent to the SI joint  without obvious fracture. Second much larger area of abnormal signal intensity in the superior acetabulum with suspected small nondisplaced fractures. Third area involves the ischiopubic junction on the right side with suspected fracture on the axial sequence. Findings are worrisome for underlying bone lesions and pathologic fractures. It is possible these are all traumatic but recommend CT scan for further evaluation and correlation with PSA level with history of prior prostate cancer.   Both hips are normally located. No hip fractures are identified. No bone lesions involving the hips. Age related degenerative changes. No joint effusion.   Bilateral peritrochanteric tendinopathy no tendon rupture or bursitis. The hamstring tendons are intact. Moderate tendinopathy on the left. No muscle tears, myositis or muscle mass. Age related fatty atrophy.   Advanced degenerative changes and scoliosis noted in the lower lumbar spine.   No significant intrapelvic abnormalities are identified. No pelvic or inguinal adenopathy.   IMPRESSION: 1. Abnormal marrow signal in 3 separate places in the right hemipelvis worrisome for underlying bone lesions and pathologic fractures. It is possible these are all traumatic but recommend CT scan for further evaluation and correlation with PSA level with history of prior prostate cancer. 2. No hip fractures are identified. Age related degenerative changes. 3. Bilateral peritrochanteric tendinopathy but no tendon rupture or bursitis. 4. Advanced degenerative changes and scoliosis in the lower  lumbar spine.     Electronically Signed   By: Rudie Meyer M.D.   On: 09/09/2023 16:39    PATIENT SURVEYS:  LEFS 57/80  COGNITION: Overall cognitive status: Within functional limits for tasks assessed     SENSATION: WFL  EDEMA:  None observed   PALPATION: No tenderness noted along lateral right hip  LOWER EXTREMITY ROM:  Active ROM Right eval Left eval  Hip flexion    Hip extension    Hip abduction    Hip adduction    Hip internal rotation    Hip external rotation    Knee flexion    Knee extension    Ankle dorsiflexion    Ankle plantarflexion    Ankle inversion    Ankle eversion     (Blank rows = not tested)  LOWER EXTREMITY MMT:  MMT Right eval Left eval  Hip flexion 4 5  Hip extension 4 5  Hip abduction 2+ 5  Hip adduction 5 5  Hip internal rotation 3+ 5  Hip external rotation 3+ 5  Knee flexion 5 5  Knee extension 5 5  Ankle dorsiflexion 5 5  Ankle plantarflexion    Ankle inversion    Ankle eversion     (Blank rows = not tested)  LOWER EXTREMITY SPECIAL TESTS:  Hip special tests: Trendelenburg test: positive , Thomas test: negative, and Ober's test: negative  FUNCTIONAL TESTS:  5 times sit to stand: 20.01 sec without UE support  10 meter walk test: 0.74 m/s Berg Balance Scale: To be assessed next visit  GAIT: Distance walked: approx 100 feet Assistive device utilized: None Level of assistance: SBA Comments: (+) trendelenburg and shuffling gait with decreased foot clearance  TREATMENT DATE: 01/06/2024  NMR:   High knee march  3# without UE support (VC for Slow cadence and increased height of march) x 20 reps   Dynamic ham curl walk 3# AW - down and back in // bars without UE support x 5  Dynamic side stepping in // bar - down and back in // bars without UE support x 5   Ladder agility  activities: -forward step walk into square- 1 foot per square- down and back working on reciprocal steps x 8 trips. (No UE support and min difficulty intiially with step length but did improve with practice.   -Side step walk- 2 feet per square- down and back x10  -  start at side- step in then out then side step and repeat length of ladder and back x 10 (most difficulty with retro steps yet no LOB)  - start at side - step into ladder square with 1 foot then step over ladder square with opp LE then swing in back to start (hip ext)  and repeat throughout ladder x4 each LE  - Resistive  lateral side stepping using matrix cable system at 12.5#  X 4 each direction- most difficulty with returning going back to left side.                  PATIENT EDUCATION:  Education details: Purpose of PT; Anatomy of Hip; HEP Person educated: Patient Education method: Explanation, Demonstration, Tactile cues, Verbal cues, and Handouts Education comprehension: verbalized understanding, returned demonstration, verbal cues required, tactile cues required, and needs further education  HOME EXERCISE PROGRAM: Access Code: [email or web address] URL: [email or web address] Date: 12/14/2023 Prepared by: Maureen Ralphs  Exercises - Sidelying Hip Abduction  - 3 x weekly - 3 sets - 10 reps - 2 sec hold - Clamshell with Resistance  - 3 x weekly - 3 sets - 10 reps  ASSESSMENT:  CLINICAL IMPRESSION: Patient was challenged with balance and coordination activities- but did demonstrate some good progress overall including improved lateral and retro steps with increased confidence. He demonstrated improved side steps later with resistive matrix cable machine as well. No report of pain and no trendelenburg deviation.   Pt will benefit from PT services to address deficits in strength, balance, and mobility in order to return to full function at home with decreased risk of falling.    OBJECTIVE IMPAIRMENTS:  Abnormal gait, decreased activity tolerance, decreased balance, decreased coordination, decreased endurance, decreased knowledge of condition, decreased mobility, difficulty walking, decreased strength, and hypomobility.   ACTIVITY LIMITATIONS: lifting, bending, standing, squatting, stairs, and transfers  PARTICIPATION LIMITATIONS: cleaning, laundry, shopping, community activity, occupation, and yard work  PERSONAL FACTORS: Age and 1-2 comorbidities: CVA, Prostate CA  are also affecting patient's functional outcome.   REHAB POTENTIAL: Good  CLINICAL DECISION MAKING: Evolving/moderate complexity  EVALUATION COMPLEXITY: Moderate   GOALS: Goals reviewed with patient? Yes  SHORT TERM GOALS: Target date: 01/25/2024 Pt will be independent with HEP in order to  increase strength/balance in order to improvefunction at home and work. Baseline: EVAL- No formal HEP in place Goal status: INITIAL   LONG TERM GOALS: Target date: 03/07/2024  Pt will increase LEFS by at least 9 points in order to demonstrate significant improvement in lower extremity function.   Baseline: EVAL= 57/80 Goal status: INITIAL  2.  Patient will demonstrate improved right Hip Abd strength as seen by ability to achieve Full ROM in sidelye position Baseline: EVAL- Minimal able to raise R LE in sidelye Goal  status: INITIAL  3.  Patient (> 76 years old) will complete five times sit to stand test in < 15 seconds indicating an increased LE strength and improved balance. Baseline: EVAL= 20.01 sec without UE support Goal status: INITIAL    4.  Patient will increase Berg Balance score by > 6 points to demonstrate decreased fall risk during functional activities. Baseline: EVAL: to be assessed visit #2; 12/16/2023= 43/56 Goal status: INITIAL    5.   Patient will increase 10 meter walk test to >1.36m/s as to improve gait speed for better community ambulation and to reduce fall risk. Baseline: EVAL= 0.74 Goal status:  INITIAL  6.   Patient will increase six minute walk test distance to >1000 for progression to community ambulator and improve gait ability Baseline: EVAL -to be assessed visit #2; 12/16/2023= 765 feet Goal status: INITIAL   PLAN:  PT FREQUENCY: 1-2x/week  PT DURATION: 12 weeks  PLANNED INTERVENTIONS: 97164- PT Re-evaluation, 97110-Therapeutic exercises, 97530- Therapeutic activity, 97112- Neuromuscular re-education, 97535- Self Care, 78469- Manual therapy, [phone]- Gait training, [phone]- Orthotic Fit/training, [phone]- Electrical stimulation (manual), Patient/Family education, Balance training, Stair training, Taping, Dry Needling, Joint mobilization, Joint manipulation, Spinal manipulation, Spinal mobilization, DME instructions, Cryotherapy, and Moist heat  PLAN FOR NEXT SESSION:  Continue with progressive therex for Hip strengthening    Lenda Kelp, PT 01/07/2024, 10:23 AM

## 2024-01-10 ENCOUNTER — Ambulatory Visit: Payer: Medicare Other

## 2024-01-10 DIAGNOSIS — I639 Cerebral infarction, unspecified: Secondary | ICD-10-CM

## 2024-01-10 LAB — CUP PACEART REMOTE DEVICE CHECK
Date Time Interrogation Session: 20250309232137
Implantable Pulse Generator Implant Date: 20240813

## 2024-01-11 ENCOUNTER — Ambulatory Visit: Payer: Medicare Other

## 2024-01-11 DIAGNOSIS — R269 Unspecified abnormalities of gait and mobility: Secondary | ICD-10-CM | POA: Diagnosis not present

## 2024-01-11 DIAGNOSIS — M6281 Muscle weakness (generalized): Secondary | ICD-10-CM

## 2024-01-11 DIAGNOSIS — M25552 Pain in left hip: Secondary | ICD-10-CM

## 2024-01-11 DIAGNOSIS — M545 Low back pain, unspecified: Secondary | ICD-10-CM

## 2024-01-11 NOTE — Therapy (Signed)
 OUTPATIENT PHYSICAL THERAPY LOWER EXTREMITY TREATMENT   Patient Name: Shannon Chung MRN: [id number] DOB:[birth year], 83 y.o., male Today's Date: 01/12/2024  END OF SESSION:  PT End of Session - 01/11/24 1454     Visit Number 9    Number of Visits 24    Date for PT Re-Evaluation 03/07/24    Progress Note Due on Visit 10    PT Start Time 1450    PT Stop Time 1529    PT Time Calculation (min) 39 min    Equipment Utilized During Treatment Gait belt    Activity Tolerance Patient tolerated treatment well    Behavior During Therapy WFL for tasks assessed/performed                   Past Medical History:  Diagnosis Date   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 06/2015 Cardiac CT: Ca score 1103 (84th %'ile);  b. 07/2015 Cath: LM 70, LAD 80p, 100/94m, D1 70, D2 95, RI 75, RCA 100p/m;  c. 07/2015 CABG x 5 (LIMA->LAD, VG->Diag, VG->OM1->OM2, VG->OM3).   COVID-19    12/2021   COVID-19 01/18/2022   COVID-19 vaccine administered 01/18/2022   Unknown how many vaccine doses have been received. Entered from Emergency Triage Note.   Diabetes mellitus without complication (HCC) 07/2015   Dyslipidemia    Essential hypertension    Essential hypertension 01/02/2015   Formatting of this note might be different from the original.  Last Assessment & Plan:   Chronic, stable. Continue current regimen.   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 dilated, EF 35% improved to 45-50% (2015);  b. 07/2015 EF 25-35% by LV gram.   Ischemic cardiomyopathy 01/02/2015   Kidney stones 04/17/2021   Lone atrial fibrillation (HCC) 1983   a. isolated episode, not on OAC.   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 06/2014: LV; resolved with coumadin-->no residual on f/u echo, no longer on coumadin.   Mural thrombus of heart 08/26/2021   Formatting of this note might be different from the original. Jun 16, 2019 Entered  By: [name] Comment: left ventricle, cardiac apex   Osteoarthritis    a. 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 Jun 16, 2019 Entered By: [name] Comment: leftAug 19, 2020 Entered By: [name] Comment: resolved by total left knee replacement Jun 16, 2019 Entered By: [name] Comment: leftAug 19, 2020 Entered By: [name] Comment: resolved by total left knee replacement   Trigger finger of left hand 07/07/2019   Vitamin D deficiency    Past Surgical History:  Procedure Laterality Date   BICEPS TENDON REPAIR Right 1993   CARDIAC CATHETERIZATION N/A 07/05/2015   Procedure: Left Heart Cath and Coronary Angiography;  Surgeon: Antonieta Iba, MD;  Location: [facility] INVASIVE CV LAB;  Service: Cardiovascular;  Laterality: N/A;   CAROTID PTA/STENT INTERVENTION Left 04/26/2023   Procedure: CAROTID PTA/STENT INTERVENTION;  Surgeon: Annice Needy, MD;  Location: [facility] INVASIVE CV LAB;  Service: Cardiovascular;  Laterality: Left;   CATARACT EXTRACTION Left 12/2016   with keratoplasty   COLONOSCOPY  2007   COLONOSCOPY WITH PROPOFOL N/A 12/02/2017   TA, SSA, rpt 3 yrs(Tahiliani, Varnita B, MD)   COLONOSCOPY WITH PROPOFOL N/A 11/26/2020   Procedure: COLONOSCOPY WITH PROPOFOL;  Surgeon: Midge Minium, MD;  Location: [facility] ENDOSCOPY;  Service: Endoscopy;  Laterality: N/A;   CORONARY ARTERY BYPASS GRAFT N/A 07/29/2015   Procedure: CORONARY ARTERY BYPASS GRAFTING (CABG) x 5 (LIMA to LAD, SVG to DIAGONAL,  SVG SEQUENTIALLY to OM1 and OM2, SVG to OM3) with Endoscopic Vein Havesting of  GREATER SAPHENOUS VEIN from RIGHT THIGH and partial LOWER LEG ;  Surgeon: Alleen Borne, MD;  Location: [facility] OR;  Service: Open Heart Surgery;  Laterality: N/A;   EYE SURGERY     b/l cataract and cornea replaced    HAND SURGERY     left hand 1st/2nd trigger fingers Dr. Hyacinth Meeker ortho    JOINT REPLACEMENT     KNEE ARTHROSCOPY Left remote   MOHS SURGERY     left cheek scc 2022 Dr. Jeannine Boga   MOHS  SURGERY     x 5 facial scc   right biceps tendon     repair/re attachment    SKIN CANCER EXCISION  10/2015   BCC - L ala (pending MOHs) and L scapula (complete excision)   TEE WITHOUT CARDIOVERSION N/A 07/29/2015   Procedure: TRANSESOPHAGEAL ECHOCARDIOGRAM (TEE);  Surgeon: Alleen Borne, MD;  Location: [facility] OR;  Service: Open Heart Surgery;  Laterality: N/A;   TONSILLECTOMY  1949   TOTAL KNEE ARTHROPLASTY Left 03/18/2016   cemented L TKR; Deeann Saint, MD   Patient Active Problem List   Diagnosis Date Noted   Annual physical exam 07/10/2023   TIA (  transient ischemic attack) 05/02/2023   GERD without esophagitis 05/02/2023   Carotid stenosis, symptomatic, with infarction (HCC) 04/26/2023   History of CVA (cerebrovascular accident) without residual deficits 03/04/2023   Acute CVA (cerebrovascular accident) (HCC) 02/22/2023   Chronic radicular pain of lower back 08/10/2022   Cervical spondylosis 03/11/2022   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   Bilateral carotid artery stenosis 03/11/2022   Lumbar spondylosis 10/07/2021   DDD (degenerative disc disease), lumbar 10/07/2021   History of radiation therapy 08/26/2021   Aortic atherosclerosis (HCC) 04/17/2021   Diverticulosis 04/17/2021   Hypertension associated with diabetes (HCC) 10/07/2020   Overweight (BMI 25.0-29.9) 10/07/2020   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   Vitamin D deficiency 08/01/2020   Polyp of sigmoid colon 08/01/2020   Insomnia 06/07/2020   Gastroesophageal reflux disease 04/04/2020   Degenerative joint disease of hand 03/01/2020   BPH (benign prostatic hyperplasia) 08/05/2018   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   Macular scar of both eyes 04/20/2018   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   Scotoma involving central area of both eyes 04/20/2018   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   Fatty liver 03/25/2018   Erectile dysfunction 02/23/2017   Hx of CABG 01/24/2017   Advanced care planning/counseling  discussion 10/21/2015   Coronary artery disease of native artery of native heart with stable angina pectoris (HCC)    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   Dyslipidemia 01/02/2015   Osteoarthritis 01/02/2015   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/02/2014    PCP: Dana Allan, MD  REFERRING PROVIDER: Kennedy Bucker, MD  REFERRING DIAG:  S76.019A (ICD-10-CM) - Rupture of hip abductor tendon  S70.01XA (ICD-10-CM) - Contusion of right hip    THERAPY DIAG:  Abnormality of gait and mobility  Muscle weakness (generalized)  Bilateral low back pain without sciatica, unspecified chronicity  Pain in left hip  Rationale for Evaluation and Treatment: Rehabilitation  ONSET DATE: 08/09/2023  SUBJECTIVE:   SUBJECTIVE STATEMENT: I am doing okay - brought in my ankle weight and wanted to make sure it was okay.    From Eval:  Fall at dtr house at [place] on 08/09/2023- Contusion of right hip and rupture of hip abductor tendon. Conservative treatment and patient reports doing better. States leg is weak with some difficulty walking yet no pain.   PERTINENT HISTORY: The patient sustained a fall while visiting family in [place] back in Oct 2024 and pain has improved but reports still feeling week.    PAIN:  Are you having pain? No  PRECAUTIONS: Fall  RED FLAGS: None   WEIGHT BEARING RESTRICTIONS: No  FALLS:  Has patient fallen in last 6 months? Yes. 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  DIAGNOSTIC FINDINGS:  Narrative & Impression   CLINICAL DATA:  Larey Seat 1 month ago. Persistent right hip pain and difficulty walking.   EXAM: MR OF THE RIGHT HIP WITHOUT CONTRAST   TECHNIQUE: Multiplanar, multisequence MR imaging was performed. No intravenous contrast was administered.   COMPARISON:  None   FINDINGS: Abnormal marrow signal in 3 separate places in the right hemipelvis. 3  cm area of abnormal T1 and T2 signal intensity in the right iliac bone adjacent to the SI joint without obvious fracture. Second much larger area of abnormal signal intensity in the superior acetabulum with suspected small nondisplaced fractures. Third area involves the ischiopubic junction on the right side with suspected fracture on the axial sequence. Findings are worrisome for underlying bone lesions and pathologic fractures. It is possible these are all traumatic but recommend CT scan for further evaluation and correlation with PSA level with history of prior prostate cancer.   Both hips are normally located. No hip fractures are identified. No bone lesions involving the hips. Age related degenerative changes. No joint effusion.   Bilateral peritrochanteric tendinopathy no tendon rupture or bursitis. The hamstring tendons are intact. Moderate tendinopathy on the left. No muscle tears, myositis or muscle mass. Age related fatty atrophy.   Advanced degenerative changes and scoliosis noted in the lower lumbar spine.   No significant intrapelvic abnormalities are identified. No pelvic or inguinal adenopathy.   IMPRESSION: 1. Abnormal marrow signal in 3 separate places in the right hemipelvis worrisome for underlying bone lesions and pathologic fractures. It is possible these are all traumatic but recommend CT scan for further evaluation and correlation with PSA level with history of prior prostate cancer. 2. No hip fractures are identified. Age related degenerative changes. 3. Bilateral peritrochanteric tendinopathy but no tendon  rupture or bursitis. 4. Advanced degenerative changes and scoliosis in the lower lumbar spine.     Electronically Signed   By: Rudie Meyer M.D.   On: 09/09/2023 16:39    PATIENT SURVEYS:  LEFS 57/80  COGNITION: Overall cognitive status: Within functional limits for tasks assessed     SENSATION: WFL  EDEMA:  None observed   PALPATION: No tenderness noted along lateral right hip  LOWER EXTREMITY ROM:  Active ROM Right eval Left eval  Hip flexion    Hip extension    Hip abduction    Hip adduction    Hip internal rotation    Hip external rotation    Knee flexion    Knee extension    Ankle dorsiflexion    Ankle plantarflexion    Ankle inversion    Ankle eversion     (Blank rows = not tested)  LOWER EXTREMITY MMT:  MMT Right eval Left eval  Hip flexion 4 5  Hip extension 4 5  Hip abduction 2+ 5  Hip adduction 5 5  Hip internal rotation 3+ 5  Hip external rotation 3+ 5  Knee flexion 5 5  Knee extension 5 5  Ankle dorsiflexion 5 5  Ankle plantarflexion    Ankle inversion    Ankle eversion     (Blank rows = not tested)  LOWER EXTREMITY SPECIAL TESTS:  Hip special tests: Trendelenburg test: positive , Thomas test: negative, and Ober's test: negative  FUNCTIONAL TESTS:  5 times sit to stand: 20.01 sec without UE support  10 meter walk test: 0.74 m/s Berg Balance Scale: To be assessed next visit  GAIT: Distance walked: approx 100 feet Assistive device utilized: None Level of assistance: SBA Comments: (+) trendelenburg and shuffling gait with decreased foot clearance  TREATMENT DATE: 01/06/2024  NMR:   High knee march  3# without UE support (VC for Slow cadence and increased height of march) x 20 reps   Dynamic high knee march walk 3# in // bars with retro walk back x 10 (difficulty with height of march and balance with  some shuffling     Dynamic side stepping in // bar - down and back in // bars without UE support x 5  Forward/retro hip swing with static stand on opp LE and No UE support 3#  x 5 reps x 2 sets      THEREX: FOR LE STRENGTH/POWER  Resistive Supine SLR - 3# 2 sets of 10  Bridging with 3 sec hold 2 sets of 10 reps Sidelye Hip abd with 3# RLE - 2 sets of 10 reps Sidelye Hip clamshell 3 # RLE- 2 sets of 10 reps.              PATIENT EDUCATION:  Education details: Purpose of PT; Anatomy of Hip; HEP Person educated: Patient Education method: Explanation, Demonstration, Tactile cues, Verbal cues, and Handouts Education comprehension: verbalized understanding, returned demonstration, verbal cues required, tactile cues required, and needs further education  HOME EXERCISE PROGRAM: Access Code: [email or web address] URL: [email or web address] Date: 12/14/2023 Prepared by: Maureen Ralphs  Exercises - Sidelying Hip Abduction  - 3 x weekly - 3 sets - 10 reps - 2 sec hold - Clamshell with Resistance  - 3 x weekly - 3 sets - 10 reps  ASSESSMENT:  CLINICAL IMPRESSION: Treatment focused on LE strength and power as well as weight bearing and attempting to improve step length. Overall patient performed well- able to perform 2 sets with resistance and no significant difficulty except with hip marching or dynamic SLS activities- still lacks hip stabilization to perform sustained SLS.   Pt will benefit from PT services to address deficits in strength, balance, and mobility in order to return to full function at home with decreased risk of falling.    OBJECTIVE IMPAIRMENTS: Abnormal gait, decreased activity tolerance, decreased balance, decreased coordination, decreased endurance, decreased knowledge of condition, decreased mobility, difficulty walking, decreased strength, and hypomobility.   ACTIVITY LIMITATIONS: lifting, bending, standing, squatting, stairs, and  transfers  PARTICIPATION LIMITATIONS: cleaning, laundry, shopping, community activity, occupation, and yard work  PERSONAL FACTORS: Age and 1-2 comorbidities: CVA, Prostate CA  are also affecting patient's functional outcome.   REHAB POTENTIAL: Good  CLINICAL DECISION MAKING: Evolving/moderate complexity  EVALUATION COMPLEXITY: Moderate   GOALS: Goals reviewed with patient? Yes  SHORT TERM GOALS: Target date: 01/25/2024 Pt will be independent with HEP in order to  increase strength/balance in order to improvefunction at home and work. Baseline: EVAL- No formal HEP in place Goal status: INITIAL   LONG TERM GOALS: Target date: 03/07/2024  Pt will increase LEFS by at least 9 points in order to demonstrate significant improvement in lower extremity function.   Baseline: EVAL= 57/80 Goal status: INITIAL  2.  Patient will demonstrate improved right Hip Abd strength as seen by ability to achieve Full ROM in sidelye position Baseline: EVAL- Minimal able to raise R LE in sidelye Goal status: INITIAL  3.  Patient (> 86 years old) will complete five times sit to stand test in < 15 seconds indicating an increased LE strength and improved balance. Baseline: EVAL= 20.01 sec without UE support Goal status: INITIAL    4.  Patient will increase Berg Balance score by > 6 points to demonstrate decreased fall risk during  functional activities. Baseline: EVAL: to be assessed visit #2; 12/16/2023= 43/56 Goal status: INITIAL    5.   Patient will increase 10 meter walk test to >1.1m/s as to improve gait speed for better community ambulation and to reduce fall risk. Baseline: EVAL= 0.74 Goal status: INITIAL  6.   Patient will increase six minute walk test distance to >1000 for progression to community ambulator and improve gait ability Baseline: EVAL -to be assessed visit #2; 12/16/2023= 765 feet Goal status: INITIAL   PLAN:  PT FREQUENCY: 1-2x/week  PT DURATION: 12 weeks  PLANNED  INTERVENTIONS: 97164- PT Re-evaluation, 97110-Therapeutic exercises, 97530- Therapeutic activity, 97112- Neuromuscular re-education, 97535- Self Care, 16109- Manual therapy, [phone]- Gait training, [phone]- Orthotic Fit/training, [phone]- Electrical stimulation (manual), Patient/Family education, Balance training, Stair training, Taping, Dry Needling, Joint mobilization, Joint manipulation, Spinal manipulation, Spinal mobilization, DME instructions, Cryotherapy, and Moist heat  PLAN FOR NEXT SESSION:  Continue with progressive therex for Hip strengthening    Lenda Kelp, PT 01/12/2024, 11:34 AM

## 2024-01-12 NOTE — Progress Notes (Signed)
 [facility] Summary Report / Loop Recorder

## 2024-01-13 ENCOUNTER — Encounter: Payer: Self-pay | Admitting: Cardiology

## 2024-01-13 ENCOUNTER — Ambulatory Visit: Payer: Medicare Other

## 2024-01-13 DIAGNOSIS — M25552 Pain in left hip: Secondary | ICD-10-CM

## 2024-01-13 DIAGNOSIS — R269 Unspecified abnormalities of gait and mobility: Secondary | ICD-10-CM

## 2024-01-13 DIAGNOSIS — M545 Low back pain, unspecified: Secondary | ICD-10-CM

## 2024-01-13 DIAGNOSIS — M6281 Muscle weakness (generalized): Secondary | ICD-10-CM

## 2024-01-13 NOTE — Therapy (Addendum)
 OUTPATIENT PHYSICAL THERAPY LOWER EXTREMITY TREATMENT/Physical Therapy Progress Note   Dates of reporting period  12/14/2023   to   01/13/2024    Patient Name: Shannon Chung MRN: [id number] DOB:[birth year], 83 y.o., male Today's Date: 01/14/2024  END OF SESSION:  PT End of Session - 01/13/24 1617     Visit Number 10    Number of Visits 24    Date for PT Re-Evaluation 03/07/24    Progress Note Due on Visit 20    PT Start Time 1615    PT Stop Time 1700    PT Time Calculation (min) 45 min    Equipment Utilized During Treatment Gait belt    Activity Tolerance Patient tolerated treatment well    Behavior During Therapy WFL for tasks assessed/performed                   Past Medical History:  Diagnosis Date   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 06/2015 Cardiac CT: Ca score 1103 (84th %'ile);  b. 07/2015 Cath: LM 70, LAD 80p, 100/81m, D1 70, D2 95, RI 75, RCA 100p/m;  c. 07/2015 CABG x 5 (LIMA->LAD, VG->Diag, VG->OM1->OM2, VG->OM3).   COVID-19    12/2021   COVID-19 01/18/2022   COVID-19 vaccine administered 01/18/2022   Unknown how many vaccine doses have been received. Entered from Emergency Triage Note.   Diabetes mellitus without complication (HCC) 07/2015   Dyslipidemia    Essential hypertension    Essential hypertension 01/02/2015   Formatting of this note might be different from the original.  Last Assessment & Plan:   Chronic, stable. Continue current regimen.   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 dilated, EF 35% improved to 45-50% (2015);  b. 07/2015 EF 25-35% by LV gram.   Ischemic cardiomyopathy 01/02/2015   Kidney stones 04/17/2021   Lone atrial fibrillation (HCC) 1983   a. isolated episode, not on OAC.   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 06/2014: LV; resolved with coumadin-->no residual on f/u echo, no longer on coumadin.   Mural thrombus of  heart 08/26/2021   Formatting of this note might be different from the original. Jun 16, 2019 Entered By: [name] Comment: left ventricle, cardiac apex   Osteoarthritis    a. 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 Jun 16, 2019 Entered By: [name] Comment: leftAug 19, 2020 Entered By: [name] Comment: resolved by total left knee replacement Jun 16, 2019 Entered By: [name] Comment: leftAug 19, 2020 Entered By: [name] Comment: resolved by total left knee replacement   Trigger finger of left hand 07/07/2019   Vitamin D deficiency    Past Surgical History:  Procedure Laterality Date   BICEPS TENDON REPAIR Right 1993   CARDIAC CATHETERIZATION N/A 07/05/2015   Procedure: Left Heart Cath and Coronary Angiography;  Surgeon: Antonieta Iba, MD;  Location: [facility] INVASIVE CV LAB;  Service: Cardiovascular;  Laterality: N/A;   CAROTID PTA/STENT INTERVENTION Left 04/26/2023   Procedure: CAROTID PTA/STENT INTERVENTION;  Surgeon: Annice Needy, MD;  Location: [facility] INVASIVE CV LAB;  Service: Cardiovascular;  Laterality: Left;   CATARACT EXTRACTION Left 12/2016   with keratoplasty   COLONOSCOPY  2007   COLONOSCOPY WITH PROPOFOL N/A 12/02/2017   TA, SSA, rpt 3 yrs(Tahiliani, Varnita B, MD)   COLONOSCOPY WITH PROPOFOL N/A 11/26/2020   Procedure: COLONOSCOPY WITH PROPOFOL;  Surgeon: Midge Minium, MD;  Location: [facility] ENDOSCOPY;  Service: Endoscopy;  Laterality: N/A;   CORONARY ARTERY BYPASS GRAFT N/A 07/29/2015   Procedure: CORONARY ARTERY BYPASS GRAFTING (CABG) x 5 (LIMA to LAD, SVG to DIAGONAL,  SVG SEQUENTIALLY to OM1 and OM2, SVG to OM3) with Endoscopic Vein Havesting of  GREATER SAPHENOUS VEIN from RIGHT THIGH and partial LOWER LEG ;  Surgeon: Alleen Borne, MD;  Location: [facility] OR;  Service: Open Heart Surgery;  Laterality: N/A;   EYE SURGERY     b/l cataract and cornea replaced    HAND SURGERY     left hand 1st/2nd trigger fingers Dr. Hyacinth Meeker ortho    JOINT  REPLACEMENT     KNEE ARTHROSCOPY Left remote   MOHS SURGERY     left cheek scc 2022 Dr. Jeannine Boga   MOHS SURGERY     x 5 facial scc   right biceps tendon     repair/re attachment    SKIN CANCER EXCISION  10/2015   BCC - L ala (pending MOHs) and L scapula (complete excision)   TEE WITHOUT CARDIOVERSION N/A 07/29/2015   Procedure: TRANSESOPHAGEAL ECHOCARDIOGRAM (TEE);  Surgeon: Alleen Borne, MD;  Location: [facility] OR;  Service: Open Heart Surgery;  Laterality: N/A;   TONSILLECTOMY  1949   TOTAL KNEE ARTHROPLASTY Left 03/18/2016   cemented L TKR; Deeann Saint, MD  Patient Active Problem List   Diagnosis Date Noted   Annual physical exam 07/10/2023   TIA (transient ischemic attack) 05/02/2023   GERD without esophagitis 05/02/2023   Carotid stenosis, symptomatic, with infarction (HCC) 04/26/2023   History of CVA (cerebrovascular accident) without residual deficits 03/04/2023   Acute CVA (cerebrovascular accident) (HCC) 02/22/2023   Chronic radicular pain of lower back 08/10/2022   Cervical spondylosis 03/11/2022   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   Bilateral carotid artery stenosis 03/11/2022   Lumbar spondylosis 10/07/2021   DDD (degenerative disc disease), lumbar 10/07/2021   History of radiation therapy 08/26/2021   Aortic atherosclerosis (HCC) 04/17/2021   Diverticulosis 04/17/2021   Hypertension associated with diabetes (HCC) 10/07/2020   Overweight (BMI 25.0-29.9) 10/07/2020   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   Vitamin D deficiency 08/01/2020   Polyp of sigmoid colon 08/01/2020   Insomnia 06/07/2020   Gastroesophageal reflux disease 04/04/2020   Degenerative joint disease of hand 03/01/2020   BPH (benign prostatic hyperplasia) 08/05/2018   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   Macular scar of both eyes 04/20/2018   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   Scotoma involving central area of both eyes 04/20/2018   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   Fatty liver  03/25/2018   Erectile dysfunction 02/23/2017   Hx of CABG 01/24/2017   Advanced care planning/counseling discussion 10/21/2015   Coronary artery disease of native artery of native heart with stable angina pectoris (HCC)    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   Dyslipidemia 01/02/2015   Osteoarthritis 01/02/2015   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/02/2014    PCP: Dana Allan, MD  REFERRING PROVIDER: Kennedy Bucker, MD  REFERRING DIAG:  S76.019A (ICD-10-CM) - Rupture of hip abductor tendon  S70.01XA (ICD-10-CM) - Contusion of right hip    THERAPY DIAG:  Abnormality of gait and mobility  Muscle weakness (generalized)  Bilateral low back pain without sciatica, unspecified chronicity  Pain in left hip  Rationale for Evaluation and Treatment: Rehabilitation  ONSET DATE: 08/09/2023  SUBJECTIVE:   SUBJECTIVE STATEMENT: Patient reports doing well overall - no new issues.    From Eval:  Fall at dtr house [place] on 08/09/2023- Contusion of right hip and rupture of hip abductor tendon. Conservative treatment and patient reports doing better. States leg is weak with some difficulty walking yet no pain.   PERTINENT HISTORY: The patient sustained a fall while visiting family in [place] back in Oct 2024 and pain has improved but reports still feeling week.    PAIN:  Are you having pain? No  PRECAUTIONS: Fall  RED FLAGS: None   WEIGHT BEARING RESTRICTIONS: No  FALLS:  Has patient fallen in last 6 months? Yes. 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  DIAGNOSTIC FINDINGS:  Narrative & Impression  CLINICAL DATA:  Larey Seat 1 month ago. Persistent right hip pain and difficulty walking.   EXAM: MR OF THE RIGHT HIP WITHOUT CONTRAST   TECHNIQUE: Multiplanar, multisequence MR imaging was performed. No intravenous contrast was administered.   COMPARISON:  None   FINDINGS: Abnormal marrow signal  in 3 separate places in the right hemipelvis. 3 cm area of abnormal T1 and T2 signal intensity in the right iliac bone adjacent to the SI joint without obvious fracture. Second much larger area of abnormal signal intensity in the superior acetabulum with suspected small nondisplaced fractures. Third area involves the ischiopubic junction on the right side with suspected fracture on the axial sequence. Findings are worrisome for underlying bone lesions and pathologic fractures. It is possible these are all traumatic but recommend CT scan for further evaluation and correlation with PSA level with history of prior prostate cancer.   Both hips are normally located. No hip fractures are identified. No bone lesions involving the hips. Age related degenerative changes. No joint effusion.   Bilateral peritrochanteric tendinopathy no tendon rupture or bursitis. The hamstring tendons are intact. Moderate tendinopathy on the left. No muscle tears, myositis or muscle mass. Age related fatty atrophy.   Advanced degenerative changes and scoliosis noted in the lower lumbar spine.   No significant intrapelvic abnormalities are identified. No pelvic or inguinal adenopathy.   IMPRESSION: 1. Abnormal marrow signal in 3 separate places in the right hemipelvis worrisome for underlying bone lesions and pathologic fractures. It is possible these are all traumatic but recommend CT scan for further evaluation and correlation with PSA level with history of prior prostate cancer. 2. No hip fractures are identified. Age related  degenerative changes. 3. Bilateral peritrochanteric tendinopathy but no tendon rupture or bursitis. 4. Advanced degenerative changes and scoliosis in the lower lumbar spine.     Electronically Signed   By: Rudie Meyer M.D.   On: 09/09/2023 16:39    PATIENT SURVEYS:  LEFS 57/80  COGNITION: Overall cognitive status: Within functional limits for tasks assessed     SENSATION: WFL  EDEMA:  None observed   PALPATION: No tenderness noted along lateral right hip  LOWER EXTREMITY ROM:  Active ROM Right eval Left eval  Hip flexion    Hip extension    Hip abduction    Hip adduction    Hip internal rotation    Hip external rotation    Knee flexion    Knee extension    Ankle dorsiflexion    Ankle plantarflexion    Ankle inversion    Ankle eversion     (Blank rows = not tested)  LOWER EXTREMITY MMT:  MMT Right eval Left eval  Hip flexion 4 5  Hip extension 4 5  Hip abduction 2+ 5  Hip adduction 5 5  Hip internal rotation 3+ 5  Hip external rotation 3+ 5  Knee flexion 5 5  Knee extension 5 5  Ankle dorsiflexion 5 5  Ankle plantarflexion    Ankle inversion    Ankle eversion     (Blank rows = not tested)  LOWER EXTREMITY SPECIAL TESTS:  Hip special tests: Trendelenburg test: positive , Thomas test: negative, and Ober's test: negative  FUNCTIONAL TESTS:  5 times sit to stand: 20.01 sec without UE support  10 meter walk test: 0.74 m/s Berg Balance Scale: To be assessed next visit  GAIT: Distance walked: approx 100 feet Assistive device utilized: None Level of assistance: SBA Comments: (+) trendelenburg and shuffling gait with decreased foot clearance  TREATMENT DATE: 01/13/2024   Therapeutic activities:   Progressive Nustep L1-5 x 6 min with BUE/LE for ROM/Coordination/strength. VC to keep SPM> 50 (patient reported knees  feeling okay after)   Sit to stand without UE support x 15 reps from armchair  Standing hip ER (cones placed on each side for guidance) VC to keep knees march) 2 x 12 reps  Standing Hip ext alt LE 2 sets  x 15 reps each LE   Standing hip circles (Swing leg around cone placed on floor) - CW/CCW  2 sets of 10 reps each.    NMR:    Dynamic step tap onto 1st step (no UE support) x 20 reps alt LE Dynamic step tap onto 2nd step (no UE support) x 20 alt LE  Dynamic side stepping up/over 1/2 foam x 20 alt LE Dynamic forward/retro step up/over 1/2 foam x 20 reps alt LE                      PATIENT EDUCATION:  Education details: Exercise technique Person educated: Patient Education method: Explanation, Demonstration, Tactile cues, Verbal cues, and Handouts Education comprehension: verbalized understanding, returned demonstration, verbal cues required, tactile cues required, and needs further education  HOME EXERCISE PROGRAM: Access Code: [email or web address] URL: [email or web address] Date: 12/14/2023 Prepared by: Maureen Ralphs  Exercises - Sidelying Hip Abduction  - 3 x weekly - 3 sets - 10 reps - 2 sec hold - Clamshell with Resistance  - 3 x weekly - 3 sets - 10 reps  ASSESSMENT:  CLINICAL IMPRESSION: Treatment focused on activities requiring stabilization of hip and improving functional mobility. He performed well with all activities- able to improve step height and length with segmented activities. No tredelenburg or LOB with any activities requiring single limb support today. Will assess all goals next visit.  Pt will benefit from PT services to address deficits in strength, balance, and mobility in order to return to full function at home with decreased risk of falling.    OBJECTIVE IMPAIRMENTS: Abnormal gait, decreased activity tolerance, decreased balance, decreased coordination, decreased endurance, decreased knowledge of condition, decreased mobility,  difficulty walking, decreased strength, and hypomobility.   ACTIVITY LIMITATIONS: lifting, bending, standing, squatting, stairs, and transfers  PARTICIPATION LIMITATIONS: cleaning, laundry, shopping, community activity, occupation, and yard work  PERSONAL FACTORS: Age and 1-2 comorbidities: CVA, Prostate CA  are also affecting patient's functional outcome.   REHAB POTENTIAL: Good  CLINICAL DECISION MAKING: Evolving/moderate complexity  EVALUATION COMPLEXITY: Moderate   GOALS: Goals reviewed with patient? Yes  SHORT TERM GOALS: Target date: 01/25/2024 Pt will be independent with HEP in order to  increase strength/balance in order to improvefunction at home and work. Baseline: EVAL- No formal HEP in place Goal status: INITIAL   LONG TERM GOALS: Target date: 03/07/2024  Pt will increase LEFS by at least 9 points in order to demonstrate significant improvement in lower extremity function.   Baseline: EVAL= 57/80 Goal status: INITIAL  2.  Patient will demonstrate improved right Hip Abd strength as seen by ability to achieve Full ROM in sidelye position Baseline: EVAL- Minimal able to raise R LE in sidelye Goal status: INITIAL  3.  Patient (> 24 years old) will complete five times sit to stand test in < 15 seconds indicating an increased LE strength and improved balance. Baseline: EVAL= 20.01 sec without UE support Goal status: INITIAL    4.  Patient will increase Berg Balance score by > 6 points to demonstrate decreased fall  risk during functional activities. Baseline: EVAL: to be assessed visit #2; 12/16/2023= 43/56 Goal status: INITIAL    5.   Patient will increase 10 meter walk test to >1.93m/s as to improve gait speed for better community ambulation and to reduce fall risk. Baseline: EVAL= 0.74 Goal status: INITIAL  6.   Patient will increase six minute walk test distance to >1000 for progression to community ambulator and improve gait ability Baseline: EVAL -to be  assessed visit #2; 12/16/2023= 765 feet Goal status: INITIAL   PLAN:  PT FREQUENCY: 1-2x/week  PT DURATION: 12 weeks  PLANNED INTERVENTIONS: 97164- PT Re-evaluation, 97110-Therapeutic exercises, 97530- Therapeutic activity, 97112- Neuromuscular re-education, 97535- Self Care, 19147- Manual therapy, [phone]- Gait training, [phone]- Orthotic Fit/training, [phone]- Electrical stimulation (manual), Patient/Family education, Balance training, Stair training, Taping, Dry Needling, Joint mobilization, Joint manipulation, Spinal manipulation, Spinal mobilization, DME instructions, Cryotherapy, and Moist heat  PLAN FOR NEXT SESSION:  Continue with progressive therex for Hip strengthening    Lenda Kelp, PT 01/14/2024, 7:35 AM

## 2024-01-17 ENCOUNTER — Ambulatory Visit: Payer: Medicare Other

## 2024-01-17 DIAGNOSIS — M6281 Muscle weakness (generalized): Secondary | ICD-10-CM

## 2024-01-17 DIAGNOSIS — R269 Unspecified abnormalities of gait and mobility: Secondary | ICD-10-CM | POA: Diagnosis not present

## 2024-01-17 DIAGNOSIS — M545 Low back pain, unspecified: Secondary | ICD-10-CM

## 2024-01-17 DIAGNOSIS — M25552 Pain in left hip: Secondary | ICD-10-CM

## 2024-01-17 NOTE — Therapy (Signed)
 OUTPATIENT PHYSICAL THERAPY LOWER EXTREMITY TREATMENT   Patient Name: Shannon Chung MRN: [id number] DOB:Apr 18, 1941, 83 y.o., male Today's Date: 01/17/2024  END OF SESSION:  PT End of Session - 01/17/24 0839     Visit Number 11    Number of Visits 24    Date for PT Re-Evaluation 03/07/24    Progress Note Due on Visit 20    PT Start Time 0840    PT Stop Time 0928    PT Time Calculation (min) 48 min    Equipment Utilized During Treatment Gait belt    Activity Tolerance Patient tolerated treatment well    Behavior During Therapy WFL for tasks assessed/performed                    Past Medical History:  Diagnosis Date   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 06/2015 Cardiac CT: Ca score 1103 (84th %'ile);  b. 07/2015 Cath: LM 70, LAD 80p, 100/38m, D1 70, D2 95, RI 75, RCA 100p/m;  c. 07/2015 CABG x 5 (LIMA->LAD, VG->Diag, VG->OM1->OM2, VG->OM3).   COVID-19    12/2021   COVID-19 01/18/2022   COVID-19 vaccine administered 01/18/2022   Unknown how many vaccine doses have been received. Entered from Emergency Triage Note.   Diabetes mellitus without complication (HCC) 07/2015   Dyslipidemia    Essential hypertension    Essential hypertension 01/02/2015   Formatting of this note might be different from the original.  Last Assessment & Plan:   Chronic, stable. Continue current regimen.   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 dilated, EF 35% improved to 45-50% (2015);  b. 07/2015 EF 25-35% by LV gram.   Ischemic cardiomyopathy 01/02/2015   Kidney stones 04/17/2021   Lone atrial fibrillation (HCC) 1983   a. isolated episode, not on OAC.   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 06/2014: LV; resolved with coumadin-->no residual on f/u echo, no longer on coumadin.   Mural thrombus of heart 08/26/2021   Formatting of this note might be different from the original. Jun 16, 2019  Entered By: [name] Comment: left ventricle, cardiac apex   Osteoarthritis    a. 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 Jun 16, 2019 Entered By: [name] Comment: leftAug 19, 2020 Entered By: [name] Comment: resolved by total left knee replacement Jun 16, 2019 Entered By: [name] Comment: leftAug 19, 2020 Entered By: [name] Comment: resolved by total left knee replacement   Trigger finger of left hand 07/07/2019   Vitamin D deficiency    Past Surgical History:  Procedure Laterality Date   BICEPS TENDON REPAIR Right 1993   CARDIAC CATHETERIZATION N/A 07/05/2015   Procedure: Left Heart Cath and Coronary Angiography;  Surgeon: Antonieta Iba, MD;  Location: [facility] INVASIVE CV LAB;  Service: Cardiovascular;  Laterality: N/A;   CAROTID PTA/STENT INTERVENTION Left 04/26/2023   Procedure: CAROTID PTA/STENT INTERVENTION;  Surgeon: Annice Needy, MD;  Location: [facility] INVASIVE CV LAB;  Service: Cardiovascular;  Laterality: Left;   CATARACT EXTRACTION Left 12/2016   with keratoplasty   COLONOSCOPY  2007   COLONOSCOPY WITH PROPOFOL N/A 12/02/2017   TA, SSA, rpt 3 yrs(Tahiliani, Varnita B, MD)   COLONOSCOPY WITH PROPOFOL N/A 11/26/2020   Procedure: COLONOSCOPY WITH PROPOFOL;  Surgeon: Midge Minium, MD;  [facility] ENDOSCOPY;  Service: Endoscopy;  Laterality: N/A;   CORONARY ARTERY BYPASS GRAFT N/A 07/29/2015   Procedure: CORONARY ARTERY BYPASS GRAFTING (CABG) x 5 (LIMA to LAD, SVG to DIAGONAL,  SVG SEQUENTIALLY to OM1 and OM2, SVG to OM3) with Endoscopic Vein Havesting of  GREATER SAPHENOUS VEIN from RIGHT THIGH and partial LOWER LEG ;  Surgeon: Alleen Borne, MD;  Location: [facility] OR;  Service: Open Heart Surgery;  Laterality: N/A;   EYE SURGERY     b/l cataract and cornea replaced    HAND SURGERY     left hand 1st/2nd trigger fingers Dr. Hyacinth Meeker ortho    JOINT REPLACEMENT     KNEE ARTHROSCOPY Left remote   MOHS SURGERY     left cheek scc 2022 Dr. Jeannine Boga    MOHS SURGERY     x 5 facial scc   right biceps tendon     repair/re attachment    SKIN CANCER EXCISION  10/2015   BCC - L ala (pending MOHs) and L scapula (complete excision)   TEE WITHOUT CARDIOVERSION N/A 07/29/2015   Procedure: TRANSESOPHAGEAL ECHOCARDIOGRAM (TEE);  Surgeon: Alleen Borne, MD;  Location: [facility] OR;  Service: Open Heart Surgery;  Laterality: N/A;   TONSILLECTOMY  1949   TOTAL KNEE ARTHROPLASTY Left 03/18/2016   cemented L TKR; Deeann Saint, MD   Patient Active Problem List   Diagnosis Date Noted   Annual physical exam 07/10/2023  TIA (transient ischemic attack) 05/02/2023   GERD without esophagitis 05/02/2023   Carotid stenosis, symptomatic, with infarction (HCC) 04/26/2023   History of CVA (cerebrovascular accident) without residual deficits 03/04/2023   Acute CVA (cerebrovascular accident) (HCC) 02/22/2023   Chronic radicular pain of lower back 08/10/2022   Cervical spondylosis 03/11/2022   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   Bilateral carotid artery stenosis 03/11/2022   Lumbar spondylosis 10/07/2021   DDD (degenerative disc disease), lumbar 10/07/2021   History of radiation therapy 08/26/2021   Aortic atherosclerosis (HCC) 04/17/2021   Diverticulosis 04/17/2021   Hypertension associated with diabetes (HCC) 10/07/2020   Overweight (BMI 25.0-29.9) 10/07/2020   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   Vitamin D deficiency 08/01/2020   Polyp of sigmoid colon 08/01/2020   Insomnia 06/07/2020   Gastroesophageal reflux disease 04/04/2020   Degenerative joint disease of hand 03/01/2020   BPH (benign prostatic hyperplasia) 08/05/2018   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   Macular scar of both eyes 04/20/2018   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   Scotoma involving central area of both eyes 04/20/2018   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   Fatty liver 03/25/2018   Erectile dysfunction 02/23/2017   Hx of CABG 01/24/2017   Advanced care  planning/counseling discussion 10/21/2015   Coronary artery disease of native artery of native heart with stable angina pectoris (HCC)    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   Dyslipidemia 01/02/2015   Osteoarthritis 01/02/2015   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/02/2014    PCP: Dana Allan, MD  REFERRING PROVIDER: Kennedy Bucker, MD  REFERRING DIAG:  S76.019A (ICD-10-CM) - Rupture of hip abductor tendon  S70.01XA (ICD-10-CM) - Contusion of right hip    THERAPY DIAG:  Abnormality of gait and mobility  Muscle weakness (generalized)  Bilateral low back pain without sciatica, unspecified chronicity  Pain in left hip  Rationale for Evaluation and Treatment: Rehabilitation  ONSET DATE: 08/09/2023  SUBJECTIVE:   SUBJECTIVE STATEMENT:  Patient reports doing well overall - no new issues.    From Eval:  Fall at dtr house at steps on 08/09/2023- Contusion of right hip and rupture of hip abductor tendon. Conservative treatment and patient reports doing better. States leg is weak with some difficulty walking yet no pain.   PERTINENT HISTORY: The patient sustained a fall while visiting family in [place] back in Oct 2024 and pain has improved but reports still feeling week.    PAIN:  Are you having pain? No  PRECAUTIONS: Fall  RED FLAGS: None   WEIGHT BEARING RESTRICTIONS: No  FALLS:  Has patient fallen in last 6 months? Yes. 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  DIAGNOSTIC FINDINGS:  Narrative & Impression   CLINICAL DATA:  Larey Seat 1 month ago. Persistent right hip pain and difficulty walking.   EXAM: MR OF THE RIGHT HIP WITHOUT CONTRAST   TECHNIQUE: Multiplanar, multisequence MR imaging was performed. No intravenous contrast was administered.   COMPARISON:  None   FINDINGS: Abnormal marrow signal in 3 separate places in the right hemipelvis. 3 cm area of abnormal T1 and T2  signal intensity in the right iliac bone adjacent to the SI joint without obvious fracture. Second much larger area of abnormal signal intensity in the superior acetabulum with suspected small nondisplaced fractures. Third area involves the ischiopubic junction on the right side with suspected fracture on the axial sequence. Findings are worrisome for underlying bone lesions and pathologic fractures. It is possible these are all traumatic but recommend CT scan for further evaluation and correlation with PSA level with history of prior prostate cancer.   Both hips are normally located. No hip fractures are identified. No bone lesions involving the hips. Age related degenerative changes. No joint effusion.   Bilateral peritrochanteric tendinopathy no tendon rupture or bursitis. The hamstring tendons are intact. Moderate tendinopathy on the left. No muscle tears, myositis or muscle mass. Age related fatty atrophy.   Advanced degenerative changes and scoliosis noted in the lower lumbar spine.   No significant intrapelvic abnormalities are identified. No pelvic or inguinal adenopathy.   IMPRESSION: 1. Abnormal marrow signal in 3 separate places in the right hemipelvis worrisome for underlying bone lesions and pathologic fractures. It is possible these are all traumatic but recommend CT scan for further evaluation and correlation with PSA level with history of prior prostate cancer. 2. No hip fractures are identified. Age related degenerative changes. 3. Bilateral peritrochanteric tendinopathy but no tendon  rupture or bursitis. 4. Advanced degenerative changes and scoliosis in the lower lumbar spine.     Electronically Signed   By: Rudie Meyer M.D.   On: 09/09/2023 16:39    PATIENT SURVEYS:  LEFS 57/80  COGNITION: Overall cognitive status: Within functional limits for tasks assessed     SENSATION: WFL  EDEMA:  None observed   PALPATION: No tenderness noted along lateral right hip  LOWER EXTREMITY ROM:  Active ROM Right eval Left eval  Hip flexion    Hip extension    Hip abduction    Hip adduction    Hip internal rotation    Hip external rotation    Knee flexion    Knee extension    Ankle dorsiflexion    Ankle plantarflexion    Ankle inversion    Ankle eversion     (Blank rows = not tested)  LOWER EXTREMITY MMT:  MMT Right eval Left eval  Hip flexion 4 5  Hip extension 4 5  Hip abduction 2+ 5  Hip adduction 5 5  Hip internal rotation 3+ 5  Hip external rotation 3+ 5  Knee flexion 5 5  Knee extension 5 5  Ankle dorsiflexion 5 5  Ankle plantarflexion    Ankle inversion    Ankle eversion     (Blank rows = not tested)  LOWER EXTREMITY SPECIAL TESTS:  Hip special tests: Trendelenburg test: positive , Thomas test: negative, and Ober's test: negative  FUNCTIONAL TESTS:  5 times sit to stand: 20.01 sec without UE support  10 meter walk test: 0.74 m/s Berg Balance Scale: To be assessed next visit  GAIT: Distance walked: approx 100 feet Assistive device utilized: None Level of assistance: SBA Comments: (+) trendelenburg and shuffling gait with decreased foot clearance  TREATMENT DATE: 01/13/2024   Physical therapy treatment session today consisted of completing assessment of goals and administration of testing as demonstrated and documented in flow sheet, treatment, and goals section of this note. Addition treatments may  be found below.    Pt performed 5 time sit<>stand (5xSTS): 18.7 sec (>15 sec indicates increased fall risk)    10 Meter Walk Test: Patient instructed to walk 10 meters (32.8 ft) as quickly and as safely as possible at their normal speed x2 and at a fast speed x2. Time measured from 2 meter mark to 8 meter mark to accommodate ramp-up and ramp-down.  Normal speed 1: 1.0 m/s Normal speed 2: 1.0 m/s Average Normal speed: 1.0 m/s  Cut off scores: <0.4 m/s = household Ambulator, 0.4-0.8 m/s = limited community Ambulator, >0.8 m/s = community Ambulator, >1.2 m/s = crossing a street, <1.0 = increased fall risk MCID 0.05 m/s (small), 0.13 m/s (moderate), 0.06 m/s (significant)  (ANPTA Core Set of Outcome Measures for Adults with Neurologic Conditions, 2018)    [facility] PT Assessment - 01/17/24 0906       Berg Balance Test   Sit to Stand Able to stand without using hands and stabilize independently    Standing Unsupported Able to stand safely 2 minutes    Sitting with Back Unsupported but Feet Supported on Floor or Stool Able to sit safely and securely 2 minutes    Stand to Sit Sits safely with minimal use of hands    Transfers Able to transfer safely, minor use of hands    Standing Unsupported with Eyes Closed Able to stand 10 seconds safely    Standing Unsupported with Feet Together Able to place feet together independently and stand 1 minute safely    From Standing, Reach Forward with Outstretched Arm Can reach forward >12 cm safely (5")    From Standing Position, Pick up Object from Floor Able to pick up shoe, needs supervision    From Standing Position, Turn to Look Behind Over each Shoulder Looks behind from both sides and weight shifts well    Turn 360 Degrees Able to turn 360 degrees safely one side only in 4 seconds or less    Standing Unsupported, Alternately Place Feet on Step/Stool Able to stand independently and safely and complete 8 steps in 20 seconds    Standing Unsupported, One Foot  in Front Able to take small step independently and hold 30 seconds    Standing on One Leg Able to lift leg independently and hold equal to or more than 3 seconds    Total Score 49                 NMR:    Standing 3/4 tandem x multiple attempts up to 20 sec today.  SLS - multiple attempts each LE - up to 3-7 sec each LE (more difficulty on RLE)                       PATIENT EDUCATION:  Education details: Exercise technique Person educated: Patient Education method: Explanation, Demonstration, Tactile cues, Verbal cues, and Handouts Education comprehension: verbalized understanding, returned demonstration, verbal cues required, tactile cues required, and needs further education  HOME EXERCISE PROGRAM: Access Code: [email or web address] URL: [email or web address] Date: 12/14/2023 Prepared by: Maureen Ralphs  Exercises - Sidelying Hip Abduction  - 3 x weekly - 3 sets - 10 reps - 2 sec hold - Clamshell with Resistance  - 3 x weekly - 3  sets - 10 reps  ASSESSMENT:  CLINICAL IMPRESSION: Patient presents with good motivation for today's assessment visit. Patient presents with much improved functional mobility as seen by improved 10 MWT and much improved 6 min walk test. He continues to work hard toward all goals. He met his hip abd goal of sidelye Hip abd against gravity. He made significant improvement in BERG balance test and reports no falls.  Patient's condition has the potential to improve in response to therapy. Maximum improvement is yet to be obtained. The anticipated improvement is attainable and reasonable in a generally predictable time.  Pt will benefit from PT services to address deficits in strength, balance, and mobility in order to return to full function at home with decreased risk of falling.    OBJECTIVE IMPAIRMENTS: Abnormal gait, decreased activity tolerance, decreased balance, decreased coordination, decreased endurance, decreased knowledge  of condition, decreased mobility, difficulty walking, decreased strength, and hypomobility.   ACTIVITY LIMITATIONS: lifting, bending, standing, squatting, stairs, and transfers  PARTICIPATION LIMITATIONS: cleaning, laundry, shopping, community activity, occupation, and yard work  PERSONAL FACTORS: Age and 1-2 comorbidities: CVA, Prostate CA  are also affecting patient's functional outcome.   REHAB POTENTIAL: Good  CLINICAL DECISION MAKING: Evolving/moderate complexity  EVALUATION COMPLEXITY: Moderate   GOALS: Goals reviewed with patient? Yes  SHORT TERM GOALS: Target date: 01/25/2024 Pt will be independent with HEP in order to  increase strength/balance in order to improvefunction at home and work. Baseline: EVAL- No formal HEP in place; 01/17/2024= Patient reports having good knowledge of current HEP and no questions at this time. Goal status: MET   LONG TERM GOALS: Target date: 03/07/2024  Pt will increase LEFS by at least 9 points in order to demonstrate significant improvement in lower extremity function.   Baseline: EVAL= 57/80 Goal status: INITIAL  2.  Patient will demonstrate improved right Hip Abd strength as seen by ability to achieve Full ROM in sidelye position Baseline: EVAL- Minimal able to raise R LE in sidelye; 01/17/2024= Patient able to raise R LE to full ROM in sidelye hip ABD Goal status: MET  3.  Patient (> 89 years old) will complete five times sit to stand test in < 15 seconds indicating an increased LE strength and improved balance. Baseline: EVAL= 20.01 sec without UE support; 01/17/2024= 18.7 sec without UE support Goal status: PROGRESSING    4.  Patient will increase Berg Balance score by > 6 points to demonstrate decreased fall risk during functional activities. Baseline: EVAL: to be assessed visit #2; 12/16/2023= 43/56; 01/17/2024=49/56 Goal status: PROGRESSING    5.   Patient will increase 10 meter walk test to >1.42m/s as to improve gait speed for  better community ambulation and to reduce fall risk. Baseline: EVAL= 0.74; 01/17/2024= 1.0 m/s Goal status: Progressing   6.   Patient will increase six minute walk test distance to >1000 for progression to community ambulator and improve gait ability Baseline: EVAL -to be assessed visit #2; 12/16/2023= 765 feet; 01/17/2024= 955 feet  Goal status: Progressing    PLAN:  PT FREQUENCY: 1-2x/week  PT DURATION: 12 weeks  PLANNED INTERVENTIONS: 97164- PT Re-evaluation, 97110-Therapeutic exercises, 97530- Therapeutic activity, 97112- Neuromuscular re-education, 97535- Self Care, 16109- Manual therapy, [id number]- Gait training, [phone]- Orthotic Fit/training, [phone]- Electrical stimulation (manual), Patient/Family education, Balance training, Stair training, Taping, Dry Needling, Joint mobilization, Joint manipulation, Spinal manipulation, Spinal mobilization, DME instructions, Cryotherapy, and Moist heat  PLAN FOR NEXT SESSION:  Continue with progressive therex for Hip strengthening  Lenda Kelp, PT 01/17/2024, 10:16 AM

## 2024-01-18 ENCOUNTER — Encounter: Payer: Medicare Other | Admitting: Physical Therapy

## 2024-01-21 ENCOUNTER — Ambulatory Visit: Payer: Medicare Other

## 2024-01-21 DIAGNOSIS — M25552 Pain in left hip: Secondary | ICD-10-CM

## 2024-01-21 DIAGNOSIS — R269 Unspecified abnormalities of gait and mobility: Secondary | ICD-10-CM

## 2024-01-21 DIAGNOSIS — M6281 Muscle weakness (generalized): Secondary | ICD-10-CM

## 2024-01-21 DIAGNOSIS — M545 Low back pain, unspecified: Secondary | ICD-10-CM

## 2024-01-21 NOTE — Therapy (Signed)
 OUTPATIENT PHYSICAL THERAPY LOWER EXTREMITY TREATMENT   Patient Name: Nishaan Stanke MRN: [id number] DOB:06-Jan-1941, 83 y.o., male Today's Date: 01/21/2024  END OF SESSION:  PT End of Session - 01/21/24 1125     Visit Number 12    Number of Visits 24    Date for PT Re-Evaluation 03/07/24    Progress Note Due on Visit 20    PT Start Time 0931    PT Stop Time 1014    PT Time Calculation (min) 43 min    Equipment Utilized During Treatment Gait belt    Activity Tolerance Patient tolerated treatment well    Behavior During Therapy WFL for tasks assessed/performed                     Past Medical History:  Diagnosis Date   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 06/2015 Cardiac CT: Ca score 1103 (84th %'ile);  b. 07/2015 Cath: LM 70, LAD 80p, 100/78m, D1 70, D2 95, RI 75, RCA 100p/m;  c. 07/2015 CABG x 5 (LIMA->LAD, VG->Diag, VG->OM1->OM2, VG->OM3).   COVID-19    12/2021   COVID-19 01/18/2022   COVID-19 vaccine administered 01/18/2022   Unknown how many vaccine doses have been received. Entered from Emergency Triage Note.   Diabetes mellitus without complication (HCC) 07/2015   Dyslipidemia    Essential hypertension    Essential hypertension 01/02/2015   Formatting of this note might be different from the original.  Last Assessment & Plan:   Chronic, stable. Continue current regimen.   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 dilated, EF 35% improved to 45-50% (2015);  b. 07/2015 EF 25-35% by LV gram.   Ischemic cardiomyopathy 01/02/2015   Kidney stones 04/17/2021   Lone atrial fibrillation (HCC) 1983   a. isolated episode, not on OAC.   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 06/2014: LV; resolved with coumadin-->no residual on f/u echo, no longer on coumadin.   Mural thrombus of heart 08/26/2021   Formatting of this note might be different from the original. Jun 16, 2019 Entered By: [name] Comment: left ventricle, cardiac apex   Osteoarthritis    a. 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 Jun 16, 2019 Entered By: [name] Comment: leftAug 19, 2020 Entered By: [name] Comment: resolved by total left knee replacement Jun 16, 2019 Entered By: [name] Comment: leftAug 19, 2020 Entered By: [name] Comment: resolved by total left knee replacement   Trigger finger of left hand 07/07/2019   Vitamin D deficiency    Past Surgical History:  Procedure Laterality Date   BICEPS TENDON REPAIR Right 1993   CARDIAC CATHETERIZATION N/A 07/05/2015   Procedure: Left Heart Cath and Coronary Angiography;  Surgeon: Antonieta Iba, MD;  Location: [facility] INVASIVE CV LAB;  Service: Cardiovascular;  Laterality: N/A;   CAROTID PTA/STENT INTERVENTION Left 04/26/2023   Procedure: CAROTID PTA/STENT INTERVENTION;  Surgeon: Annice Needy, MD;  Location: [facility] INVASIVE CV LAB;  Service: Cardiovascular;  Laterality: Left;   CATARACT EXTRACTION Left 12/2016   with keratoplasty   COLONOSCOPY  2007   COLONOSCOPY WITH PROPOFOL N/A 12/02/2017   TA, SSA, rpt 3 yrs(Tahiliani, Varnita B, MD)   COLONOSCOPY WITH PROPOFOL N/A 11/26/2020   Procedure: COLONOSCOPY WITH PROPOFOL;  Surgeon: Midge Minium, MD;  Location: [facility] ENDOSCOPY;  Service: Endoscopy;  Laterality: N/A;   CORONARY ARTERY BYPASS GRAFT N/A 07/29/2015   Procedure: CORONARY ARTERY BYPASS GRAFTING (CABG) x 5 (LIMA to LAD, SVG to DIAGONAL,  SVG SEQUENTIALLY to OM1 and OM2, SVG to OM3) with Endoscopic Vein Havesting of  GREATER SAPHENOUS VEIN from RIGHT THIGH and partial LOWER LEG ;  Surgeon: Alleen Borne, MD;  Location: [facility] OR;  Service: Open Heart Surgery;  Laterality: N/A;   EYE SURGERY     b/l cataract and cornea replaced    HAND SURGERY     left hand 1st/2nd trigger fingers Dr. Hyac[facility] Meeker ortho    JOINT REPLACEMENT     KNEE ARTHROSCOPY Left remote   MOHS SURGERY     left cheek scc 2022 Dr. Jeannine Boga    MOHS SURGERY     x 5 facial scc   right biceps tendon     repair/re attachment    SKIN CANCER EXCISION  10/2015   BCC - L ala (pending MOHs) and L scapula (complete excision)   TEE WITHOUT CARDIOVERSION N/A 07/29/2015   Procedure: TRANSESOPHAGEAL ECHOCARDIOGRAM (TEE);  Surgeon: Alleen Borne, MD;  Location: [facility] OR;  Service: Open Heart Surgery;  Laterality: N/A;   TONSILLECTOMY  1949   TOTAL KNEE ARTHROPLASTY Left 03/18/2016   cemented L TKR; Deeann Saint, MD   Patient Active Problem List   Diagnosis Date Noted   Annual physical exam 07/10/2023  TIA (transient ischemic attack) 05/02/2023   GERD without esophagitis 05/02/2023   Carotid stenosis, symptomatic, with infarction (HCC) 04/26/2023   History of CVA (cerebrovascular accident) without residual deficits 03/04/2023   Acute CVA (cerebrovascular accident) (HCC) 02/22/2023   Chronic radicular pain of lower back 08/10/2022   Cervical spondylosis 03/11/2022   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   Bilateral carotid artery stenosis 03/11/2022   Lumbar spondylosis 10/07/2021   DDD (degenerative disc disease), lumbar 10/07/2021   History of radiation therapy 08/26/2021   Aortic atherosclerosis (HCC) 04/17/2021   Diverticulosis 04/17/2021   Hypertension associated with diabetes (HCC) 10/07/2020   Overweight (BMI 25.0-29.9) 10/07/2020   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   Vitamin D deficiency 08/01/2020   Polyp of sigmoid colon 08/01/2020   Insomnia 06/07/2020   Gastroesophageal reflux disease 04/04/2020   Degenerative joint disease of hand 03/01/2020   BPH (benign prostatic hyperplasia) 08/05/2018   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   Macular scar of both eyes 04/20/2018   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   Scotoma involving central area of both eyes 04/20/2018   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   Fatty liver 03/25/2018   Erectile dysfunction 02/23/2017   Hx of CABG 01/24/2017   Advanced care  planning/counseling discussion 10/21/2015   Coronary artery disease of native artery of native heart with stable angina pectoris (HCC)    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   Dyslipidemia 01/02/2015   Osteoarthritis 01/02/2015   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/02/2014    PCP: Dana Allan, MD  REFERRING PROVIDER: Kennedy Bucker, MD  REFERRING DIAG:  S76.019A (ICD-10-CM) - Rupture of hip abductor tendon  S70.01XA (ICD-10-CM) - Contusion of right hip    THERAPY DIAG:  Abnormality of gait and mobility  Muscle weakness (generalized)  Bilateral low back pain without sciatica, unspecified chronicity  Pain in left hip  Rationale for Evaluation and Treatment: Rehabilitation  ONSET DATE: 08/09/2023  SUBJECTIVE:   SUBJECTIVE STATEMENT:  Patient reports he has been stressed and busy this week as his wife had a health issue earlier this week. No pain and no falls to date.     From Eval:  Fall at dtr house at steps on 08/09/2023- Contusion of right hip and rupture of hip abductor tendon. Conservative treatment and patient reports doing better. States leg is weak with some difficulty walking yet no pain.   PERTINENT HISTORY: The patient sustained a fall while visiting family in [place] back in Oct 2024 and pain has improved but reports still feeling week.    PAIN:  Are you having pain? No  PRECAUTIONS: Fall  RED FLAGS: None   WEIGHT BEARING RESTRICTIONS: No  FALLS:  Has patient fallen in last 6 months? Yes. 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  DIAGNOSTIC FINDINGS:  Narrative & Impression  CLINICAL DATA:  Larey Seat 1 month ago. Persistent right hip pain and difficulty walking.   EXAM: MR OF THE RIGHT HIP WITHOUT CONTRAST   TECHNIQUE: Multiplanar, multisequence MR imaging was performed. No intravenous contrast was administered.   COMPARISON:  None   FINDINGS:  Abnormal marrow signal in 3 separate places in the right hemipelvis. 3 cm area of abnormal T1 and T2 signal intensity in the right iliac bone adjacent to the SI joint without obvious fracture. Second much larger area of abnormal signal intensity in the superior acetabulum with suspected small nondisplaced fractures. Third area involves the ischiopubic junction on the right side with suspected fracture on the axial sequence. Findings are worrisome for underlying bone lesions and pathologic fractures. It is possible these are all traumatic but recommend CT scan for further evaluation and correlation with PSA level with history of prior prostate cancer.   Both hips are normally located. No hip fractures are identified. No bone lesions involving the hips. Age related degenerative changes. No joint effusion.   Bilateral peritrochanteric tendinopathy no tendon rupture or bursitis. The hamstring tendons are intact. Moderate tendinopathy on the left. No muscle tears, myositis or muscle mass. Age related fatty atrophy.   Advanced degenerative changes and scoliosis noted in the lower lumbar spine.   No significant intrapelvic abnormalities are identified. No pelvic or inguinal adenopathy.   IMPRESSION: 1. Abnormal marrow signal in 3 separate places in the right hemipelvis worrisome for underlying bone lesions and pathologic fractures. It is possible these are all traumatic but recommend CT scan for further evaluation and correlation with PSA level with history of prior prostate cancer. 2. No hip fractures are identified. Age related  degenerative changes. 3. Bilateral peritrochanteric tendinopathy but no tendon rupture or bursitis. 4. Advanced degenerative changes and scoliosis in the lower lumbar spine.     Electronically Signed   By: Rudie Meyer M.D.   On: 09/09/2023 16:39    PATIENT SURVEYS:  LEFS 57/80  COGNITION: Overall cognitive status: Within functional limits for tasks assessed     SENSATION: WFL  EDEMA:  None observed   PALPATION: No tenderness noted along lateral right hip  LOWER EXTREMITY ROM:  Active ROM Right eval Left eval  Hip flexion    Hip extension    Hip abduction    Hip adduction    Hip internal rotation    Hip external rotation    Knee flexion    Knee extension    Ankle dorsiflexion    Ankle plantarflexion    Ankle inversion    Ankle eversion     (Blank rows = not tested)  LOWER EXTREMITY MMT:  MMT Right eval Left eval  Hip flexion 4 5  Hip extension 4 5  Hip abduction 2+ 5  Hip adduction 5 5  Hip internal rotation 3+ 5  Hip external rotation 3+ 5  Knee flexion 5 5  Knee extension 5 5  Ankle dorsiflexion 5 5  Ankle plantarflexion    Ankle inversion    Ankle eversion     (Blank rows = not tested)  LOWER EXTREMITY SPECIAL TESTS:  Hip special tests: Trendelenburg test: positive , Thomas test: negative, and Ober's test: negative  FUNCTIONAL TESTS:  5 times sit to stand: 20.01 sec without UE support  10 meter walk test: 0.74 m/s Berg Balance Scale: To be assessed next visit  GAIT: Distance walked: approx 100 feet Assistive device utilized: None Level of assistance: SBA Comments: (+) trendelenburg and shuffling gait with decreased foot clearance  TREATMENT DATE: 01/21/2024   NMR: (all activities performed below with 3# AW- use of gait belt, CGA)   -Step taps x 20 reps alt LE -Step ups x 15 reps alt LE -Walking in //  bars over 5 progressive height obstacles (from small PVC up to orange hurdle) in // bars x10  -MWT- 3#AW x 6 trials (varying between initial 16 sec without VC and down to 9.56 sec with VC.  -Walking in // bars over 5 progressive height obstacles (from small PVC up to orange hurdle) in // bars x 5 more times.  - on tape line (32feet) - patient performed forward/backward- diagonal stepping over line- zig/zag - x 4.                               PATIENT EDUCATION:  Education details: Exercise technique Person educated: Patient Education method: Explanation, Demonstration, Tactile cues, Verbal cues, and Handouts Education comprehension: verbalized understanding, returned demonstration, verbal cues required, tactile cues required, and needs further education  HOME EXERCISE PROGRAM: Access Code: [email or web address] URL: [email or web address] Date: 12/14/2023 Prepared by: Maureen Ralphs  Exercises - Sidelying Hip Abduction  - 3 x weekly - 3 sets - 10 reps - 2 sec hold - Clamshell with Resistance  - 3 x weekly - 3 sets - 10 reps  ASSESSMENT:  CLINICAL IMPRESSION: Treatment focused on balance and coordination with moving/stepping- attempting to perform activities designed to increase step length. Patient performed well overall using 3# for resistance. He was able to improve his gait speed with practice and overall able advance his LE's with all activities better today.  Pt will benefit from PT services to address deficits in strength, balance, and mobility in order to return to full function at home with decreased risk of falling.    OBJECTIVE IMPAIRMENTS: Abnormal gait, decreased activity tolerance, decreased balance, decreased coordination, decreased endurance, decreased knowledge of condition, decreased mobility, difficulty walking, decreased strength, and hypomobility.   ACTIVITY LIMITATIONS: lifting, bending, standing, squatting, stairs, and  transfers  PARTICIPATION LIMITATIONS: cleaning, laundry, shopping, community activity, occupation, and yard work  PERSONAL FACTORS: Age and 1-2 comorbidities: CVA, Prostate CA  are also affecting patient's functional outcome.   REHAB POTENTIAL: Good  CLINICAL DECISION MAKING: Evolving/moderate complexity  EVALUATION COMPLEXITY: Moderate   GOALS: Goals reviewed with patient? Yes  SHORT TERM GOALS: Target date: 01/25/2024 Pt will be independent with HEP in order to  increase strength/balance in order to improvefunction at home and work. Baseline: EVAL- No formal HEP in place; 01/17/2024= Patient reports having good knowledge of current HEP and no questions at this time. Goal status: MET   LONG TERM GOALS: Target date: 03/07/2024  Pt will increase LEFS by at least 9 points in order to demonstrate significant improvement in lower extremity function.   Baseline: EVAL= 57/80 Goal status: INITIAL  2.  Patient will demonstrate improved right Hip Abd strength as seen by ability to achieve Full ROM in sidelye position Baseline: EVAL- Minimal able to raise R LE in sidelye; 01/17/2024= Patient able to raise R LE to full ROM in sidelye hip ABD Goal status: MET  3.  Patient (> 75 years old) will complete five times sit to stand test in < 15 seconds indicating an increased LE strength and improved balance. Baseline: EVAL= 20.01 sec without UE support; 01/17/2024= 18.7 sec without UE support Goal status: PROGRESSING    4.  Patient will increase Berg Balance score by >  6 points to demonstrate decreased fall risk during functional activities. Baseline: EVAL: to be assessed visit #2; 12/16/2023= 43/56; 01/17/2024=49/56 Goal status: PROGRESSING    5.   Patient will increase 10 meter walk test to >1.65m/s as to improve gait speed for better community ambulation and to reduce fall risk. Baseline: EVAL= 0.74; 01/17/2024= 1.0 m/s Goal status: Progressing   6.   Patient will increase six minute walk  test distance to >1000 for progression to community ambulator and improve gait ability Baseline: EVAL -to be assessed visit #2; 12/16/2023= 765 feet; 01/17/2024= 955 feet  Goal status: Progressing    PLAN:  PT FREQUENCY: 1-2x/week  PT DURATION: 12 weeks  PLANNED INTERVENTIONS: 97164- PT Re-evaluation, 97110-Therapeutic exercises, 97530- Therapeutic activity, 97112- Neuromuscular re-education, 97535- Self Care, 16109- Manual therapy, [phone]- Gait training, [phone]- Orthotic Fit/training, [phone]- Electrical stimulation (manual), Patient/Family education, Balance training, Stair training, Taping, Dry Needling, Joint mobilization, Joint manipulation, Spinal manipulation, Spinal mobilization, DME instructions, Cryotherapy, and Moist heat  PLAN FOR NEXT SESSION:  Continue with progressive therex for Hip strengthening    Lenda Kelp, PT 01/21/2024, 11:28 AM

## 2024-01-23 NOTE — Therapy (Signed)
 OUTPATIENT PHYSICAL THERAPY LOWER EXTREMITY TREATMENT   Patient Name: Shannon Chung MRN: [id number] DOB:09/13/1941, 83 y.o., male Today's Date: 01/24/2024  END OF SESSION:  PT End of Session - 01/24/24 0853     Visit Number 13    Number of Visits 24    Date for PT Re-Evaluation 03/07/24    Progress Note Due on Visit 20    PT Start Time 0847    PT Stop Time 0930    PT Time Calculation (min) 43 min    Equipment Utilized During Treatment Gait belt    Activity Tolerance Patient tolerated treatment well    Behavior During Therapy WFL for tasks assessed/performed                      Past Medical History:  Diagnosis Date   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 06/2015 Cardiac CT: Ca score 1103 (84th %'ile);  b. 07/2015 Cath: LM 70, LAD 80p, 100/6m, D1 70, D2 95, RI 75, RCA 100p/m;  c. 07/2015 CABG x 5 (LIMA->LAD, VG->Diag, VG->OM1->OM2, VG->OM3).   COVID-19    12/2021   COVID-19 01/18/2022   COVID-19 vaccine administered 01/18/2022   Unknown how many vaccine doses have been received. Entered from Emergency Triage Note.   Diabetes mellitus without complication (HCC) 07/2015   Dyslipidemia    Essential hypertension    Essential hypertension 01/02/2015   Formatting of this note might be different from the original.  Last Assessment & Plan:   Chronic, stable. Continue current regimen.   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 dilated, EF 35% improved to 45-50% (2015);  b. 07/2015 EF 25-35% by LV gram.   Ischemic cardiomyopathy 01/02/2015   Kidney stones 04/17/2021   Lone atrial fibrillation (HCC) 1983   a. isolated episode, not on OAC.   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 06/2014: LV; resolved with coumadin-->no residual on f/u echo, no longer on coumadin.   Mural thrombus of heart 08/26/2021   Formatting of this note might be different from the original. Jun 16, 2019 Entered By: [name] Comment: left ventricle, cardiac apex   Osteoarthritis    a. 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 Jun 16, 2019 Entered By: [name] Comment: leftAug 19, 2020 Entered By: [name] Comment: resolved by total left knee replacement Jun 16, 2019 Entered By: [name] Comment: leftAug 19, 2020 Entered By: [name] Comment: resolved by total left knee replacement   Trigger finger of left hand 07/07/2019   Vitamin D deficiency    Past Surgical History:  Procedure Laterality Date   BICEPS TENDON REPAIR Right 1993   CARDIAC CATHETERIZATION N/A 07/05/2015   Procedure: Left Heart Cath and Coronary Angiography;  Surgeon: Antonieta Iba, MD;  Location: [facility] INVASIVE CV LAB;  Service: Cardiovascular;  Laterality: N/A;   CAROTID PTA/STENT INTERVENTION Left 04/26/2023   Procedure: CAROTID PTA/STENT INTERVENTION;  Surgeon: Annice Needy, MD;  Location: [facility] INVASIVE CV LAB;  Service: Cardiovascular;  Laterality: Left;   CATARACT EXTRACTION Left 12/2016   with keratoplasty   COLONOSCOPY  2007   COLONOSCOPY WITH PROPOFOL N/A 12/02/2017   TA, SSA, rpt 3 yrs(Tahiliani, Varnita B, MD)   COLONOSCOPY WITH PROPOFOL N/A 11/26/2020   Procedure: COLONOSCOPY WITH PROPOFOL;  Surgeon: Midge Minium, MD;  Location: [facility] ENDOSCOPY;  Service: Endoscopy;  Laterality: N/A;   CORONARY ARTERY BYPASS GRAFT N/A 07/29/2015   Procedure: CORONARY ARTERY BYPASS GRAFTING (CABG) x 5 (LIMA to LAD, SVG to DIAGONAL,  SVG SEQUENTIALLY to OM1 and OM2, SVG to OM3) with Endoscopic Vein Havesting of  GREATER SAPHENOUS VEIN from RIGHT THIGH and partial LOWER LEG ;  Surgeon: Alleen Borne, MD;  Location: [facility] OR;  Service: Open Heart Surgery;  Laterality: N/A;   EYE SURGERY     b/l cataract and cornea replaced    HAND SURGERY     left hand 1st/2nd trigger fingers Dr. [facility]ac[facility]eker ortho    JOINT REPLACEMENT     KNEE ARTHROSCOPY Left remote   MOHS SURGERY     left cheek scc 2022  Dr. Jeannine Boga   MOHS SURGERY     x 5 facial scc   right biceps tendon     repair/re attachment    SKIN CANCER EXCISION  10/2015   BCC - L ala (pending MOHs) and L scapula (complete excision)   TEE WITHOUT CARDIOVERSION N/A 07/29/2015   Procedure: TRANSESOPHAGEAL ECHOCARDIOGRAM (TEE);  Surgeon: Alleen Borne, MD;  Location[facility] OR;  Service: Open Heart Surgery;  Laterality: N/A;   TONSILLECTOMY  1949   TOTAL KNEE ARTHROPLASTY Left 03/18/2016   cemented L TKR; Deeann Saint, MD   Patient Active Problem List   Diagnosis Date Noted   Annual physical exam 07/10/2023  TIA (transient ischemic attack) 05/02/2023   GERD without esophagitis 05/02/2023   Carotid stenosis, symptomatic, with infarction (HCC) 04/26/2023   History of CVA (cerebrovascular accident) without residual deficits 03/04/2023   Acute CVA (cerebrovascular accident) (HCC) 02/22/2023   Chronic radicular pain of lower back 08/10/2022   Cervical spondylosis 03/11/2022   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   Bilateral carotid artery stenosis 03/11/2022   Lumbar spondylosis 10/07/2021   DDD (degenerative disc disease), lumbar 10/07/2021   History of radiation therapy 08/26/2021   Aortic atherosclerosis (HCC) 04/17/2021   Diverticulosis 04/17/2021   Hypertension associated with diabetes (HCC) 10/07/2020   Overweight (BMI 25.0-29.9) 10/07/2020   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   Vitamin D deficiency 08/01/2020   Polyp of sigmoid colon 08/01/2020   Insomnia 06/07/2020   Gastroesophageal reflux disease 04/04/2020   Degenerative joint disease of hand 03/01/2020   BPH (benign prostatic hyperplasia) 08/05/2018   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   Macular scar of both eyes 04/20/2018   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   Scotoma involving central area of both eyes 04/20/2018   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   Fatty liver 03/25/2018   Erectile dysfunction 02/23/2017   Hx of CABG 01/24/2017   Advanced care  planning/counseling discussion 10/21/2015   Coronary artery disease of native artery of native heart with stable angina pectoris (HCC)    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   Dyslipidemia 01/02/2015   Osteoarthritis 01/02/2015   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/02/2014    PCP: Dana Allan, MD  REFERRING PROVIDER: Kennedy Bucker, MD  REFERRING DIAG:  S76.019A (ICD-10-CM) - Rupture of hip abductor tendon  S70.01XA (ICD-10-CM) - Contusion of right hip    THERAPY DIAG:  Abnormality of gait and mobility  Muscle weakness (generalized)  Bilateral low back pain without sciatica, unspecified chronicity  Pain in left hip  Rationale for Evaluation and Treatment: Rehabilitation  ONSET DATE: 08/09/2023  SUBJECTIVE:   SUBJECTIVE STATEMENT:  Patient reports a good weekend and feeling pretty good     From Eval:  Fall at dtr house at [place] on 08/09/2023- Contusion of right hip and rupture of hip abductor tendon. Conservative treatment and patient reports doing better. States leg is weak with some difficulty walking yet no pain.   PERTINENT HISTORY: The patient sustained a fall while visiting family in [place] back in Oct 2024 and pain has improved but reports still feeling week.    PAIN:  Are you having pain? No  PRECAUTIONS: Fall  RED FLAGS: None   WEIGHT BEARING RESTRICTIONS: No  FALLS:  Has patient fallen in last 6 months? Yes. 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  DIAGNOSTIC FINDINGS:  Narrative & Impression   CLINICAL DATA:  Larey Seat 1 month ago. Persistent right hip pain and difficulty walking.   EXAM: MR OF THE RIGHT HIP WITHOUT CONTRAST   TECHNIQUE: Multiplanar, multisequence MR imaging was performed. No intravenous contrast was administered.   COMPARISON:  None   FINDINGS: Abnormal marrow signal in 3 separate places in the right hemipelvis. 3 cm area of abnormal T1 and  T2 signal intensity in the right iliac bone adjacent to the SI joint without obvious fracture. Second much larger area of abnormal signal intensity in the superior acetabulum with suspected small nondisplaced fractures. Third area involves the ischiopubic junction on the right side with suspected fracture on the axial sequence. Findings are worrisome for underlying bone lesions and pathologic fractures. It is possible these are all traumatic but recommend CT scan for further evaluation and correlation with PSA level with history of prior prostate cancer.   Both hips are normally located. No hip fractures are identified. No bone lesions involving the hips. Age related degenerative changes. No joint effusion.   Bilateral peritrochanteric tendinopathy no tendon rupture or bursitis. The hamstring tendons are intact. Moderate tendinopathy on the left. No muscle tears, myositis or muscle mass. Age related fatty atrophy.   Advanced degenerative changes and scoliosis noted in the lower lumbar spine.   No significant intrapelvic abnormalities are identified. No pelvic or inguinal adenopathy.   IMPRESSION: 1. Abnormal marrow signal in 3 separate places in the right hemipelvis worrisome for underlying bone lesions and pathologic fractures. It is possible these are all traumatic but recommend CT scan for further evaluation and correlation with PSA level with history of prior prostate cancer. 2. No hip fractures are identified. Age related degenerative changes. 3. Bilateral peritrochanteric tendinopathy but no tendon  rupture or bursitis. 4. Advanced degenerative changes and scoliosis in the lower lumbar spine.     Electronically Signed   By: Rudie Meyer M.D.   On: 09/09/2023 16:39    PATIENT SURVEYS:  LEFS 57/80  COGNITION: Overall cognitive status: Within functional limits for tasks assessed     SENSATION: WFL  EDEMA:  None observed   PALPATION: No tenderness noted along lateral right hip  LOWER EXTREMITY ROM:  Active ROM Right eval Left eval  Hip flexion    Hip extension    Hip abduction    Hip adduction    Hip internal rotation    Hip external rotation    Knee flexion    Knee extension    Ankle dorsiflexion    Ankle plantarflexion    Ankle inversion    Ankle eversion     (Blank rows = not tested)  LOWER EXTREMITY MMT:  MMT Right eval Left eval  Hip flexion 4 5  Hip extension 4 5  Hip abduction 2+ 5  Hip adduction 5 5  Hip internal rotation 3+ 5  Hip external rotation 3+ 5  Knee flexion 5 5  Knee extension 5 5  Ankle dorsiflexion 5 5  Ankle plantarflexion    Ankle inversion    Ankle eversion     (Blank rows = not tested)  LOWER EXTREMITY SPECIAL TESTS:  Hip special tests: Trendelenburg test: positive , Thomas test: negative, and Ober's test: negative  FUNCTIONAL TESTS:  5 times sit to stand: 20.01 sec without UE support  10 meter walk test: 0.74 m/s Berg Balance Scale: To be assessed next visit  GAIT: Distance walked: approx 100 feet Assistive device utilized: None Level of assistance: SBA Comments: (+) trendelenburg and shuffling gait with decreased foot clearance  TREATMENT DATE: 01/24/2024   NMR:  (use of gait belt, CGA)  -Side step up 3# AW- up onto 6" block then down and back up on opp side -Step taps 3# x 20 reps alt LE -Step ups 3# AW x 12 reps alt LE -Dynamic marching on airex pad 3# x 20 reps (mild  instability- did improve confidence with practice) -Walking in // bars over 5 progressive height obstacles (from small PVC up to orange hurdle) in // bars x10  -MWT- 3#AW x 6 trials (varying between initial 16 sec without VC and down to 9.56 sec with VC.  -Walking in // bars over 5 progressive height obstacles (from small PVC up to orange hurdle) in // bars x 5 more times.  - on tape line (64feet) - patient performed forward/backward- diagonal stepping over line- zig/zag - x 4.     Therapeutic Activities: Dynamic activities designed to improve functional performance.   Lunges- Forward 3# x 15 reps Standing calf raises 3# x 15 with 2 sec hold Reverse lunge 3# x 15 reps alt LE Sit to stand- holding onto 2kg ball - overhead                         PATIENT EDUCATION:  Education details: Exercise technique Person educated: Patient Education method: Explanation, Demonstration, Tactile cues, Verbal cues, and Handouts Education comprehension: verbalized understanding, returned demonstration, verbal cues required, tactile cues required, and needs further education  HOME EXERCISE PROGRAM: Access Code: [email or web address] URL: [email or web address] Date: 12/14/2023 Prepared by: Maureen Ralphs  Exercises - Sidelying Hip Abduction  - 3 x weekly - 3 sets - 10 reps - 2 sec hold - Clamshell with Resistance  - 3 x weekly - 3 sets - 10 reps  ASSESSMENT:  CLINICAL IMPRESSION: Treatment today focused on continuing balance and LE strengthening performing many closed kinetic chain progressive LE strengthening He was able to use 3# today and reported that weight as challenging but no pain. Some unsteadiness with balance that improved with treatment.   Pt will benefit from PT services to address deficits in strength, balance, and mobility in order to return to full function at home with decreased risk of falling.    OBJECTIVE IMPAIRMENTS: Abnormal gait, decreased activity  tolerance, decreased balance, decreased coordination, decreased endurance, decreased knowledge of condition, decreased mobility, difficulty walking, decreased strength, and hypomobility.   ACTIVITY LIMITATIONS: lifting, bending, standing, squatting, stairs, and transfers  PARTICIPATION LIMITATIONS: cleaning, laundry, shopping, community activity, occupation, and yard work  PERSONAL FACTORS: Age and 1-2 comorbidities: CVA, Prostate CA  are also affecting patient's functional outcome.   REHAB POTENTIAL: Good  CLINICAL DECISION MAKING: Evolving/moderate complexity  EVALUATION COMPLEXITY: Moderate   GOALS: Goals reviewed with patient? Yes  SHORT TERM GOALS: Target date: 01/25/2024 Pt will be independent with HEP in order to  increase strength/balance in order to improvefunction at home and work. Baseline: EVAL- No formal HEP in place; 01/17/2024= Patient reports having good knowledge of current HEP and no questions at this time. Goal status: MET   LONG TERM GOALS: Target date: 03/07/2024  Pt will increase LEFS by at least 9 points in order to demonstrate significant improvement in lower extremity function.   Baseline: EVAL= 57/80 Goal status: INITIAL  2.  Patient will demonstrate improved right Hip Abd strength as seen by ability to achieve Full ROM in sidelye position Baseline: EVAL- Minimal able to raise R LE in sidelye; 01/17/2024= Patient able to raise R LE  to full ROM in sidelye hip ABD Goal status: MET  3.  Patient (> 61 years old) will complete five times sit to stand test in < 15 seconds indicating an increased LE strength and improved balance. Baseline: EVAL= 20.01 sec without UE support; 01/17/2024= 18.7 sec without UE support Goal status: PROGRESSING    4.  Patient will increase Berg Balance score by > 6 points to demonstrate decreased fall risk during functional activities. Baseline: EVAL: to be assessed visit #2; 12/16/2023= 43/56; 01/17/2024=49/56 Goal status: PROGRESSING     5.   Patient will increase 10 meter walk test to >1.7m/s as to improve gait speed for better community ambulation and to reduce fall risk. Baseline: EVAL= 0.74; 01/17/2024= 1.0 m/s Goal status: Progressing   6.   Patient will increase six minute walk test distance to >1000 for progression to community ambulator and improve gait ability Baseline: EVAL -to be assessed visit #2; 12/16/2023= 765 feet; 01/17/2024= 955 feet  Goal status: Progressing    PLAN:  PT FREQUENCY: 1-2x/week  PT DURATION: 12 weeks  PLANNED INTERVENTIONS: 97164- PT Re-evaluation, 97110-Therapeutic exercises, 97530- Therapeutic activity, 97112- Neuromuscular re-education, 97535- Self Care, 91478- Manual therapy, [phone]- Gait training, [phone]- Orthotic Fit/training, [phone]- Electrical stimulation (manual), Patient/Family education, Balance training, Stair training, Taping, Dry Needling, Joint mobilization, Joint manipulation, Spinal manipulation, Spinal mobilization, DME instructions, Cryotherapy, and Moist heat  PLAN FOR NEXT SESSION:  Continue with progressive therex for Hip strengthening    Lenda Kelp, PT 01/24/2024, 9:34 AM

## 2024-01-24 ENCOUNTER — Ambulatory Visit: Payer: Medicare Other

## 2024-01-24 DIAGNOSIS — M6281 Muscle weakness (generalized): Secondary | ICD-10-CM

## 2024-01-24 DIAGNOSIS — M545 Low back pain, unspecified: Secondary | ICD-10-CM

## 2024-01-24 DIAGNOSIS — M25552 Pain in left hip: Secondary | ICD-10-CM

## 2024-01-24 DIAGNOSIS — R269 Unspecified abnormalities of gait and mobility: Secondary | ICD-10-CM | POA: Diagnosis not present

## 2024-01-25 ENCOUNTER — Encounter: Payer: Medicare Other | Admitting: Physical Therapy

## 2024-01-25 ENCOUNTER — Telehealth: Payer: Self-pay | Admitting: Family Medicine

## 2024-01-25 NOTE — Telephone Encounter (Signed)
 Patient dropped off [facility] paperwork. Patient would like it mailed out when completed. Paper work is up front in [employer]

## 2024-01-27 ENCOUNTER — Ambulatory Visit: Payer: Medicare Other

## 2024-01-27 DIAGNOSIS — M6281 Muscle weakness (generalized): Secondary | ICD-10-CM

## 2024-01-27 DIAGNOSIS — R269 Unspecified abnormalities of gait and mobility: Secondary | ICD-10-CM | POA: Diagnosis not present

## 2024-01-27 DIAGNOSIS — M25552 Pain in left hip: Secondary | ICD-10-CM

## 2024-01-27 DIAGNOSIS — M545 Low back pain, unspecified: Secondary | ICD-10-CM

## 2024-01-27 NOTE — Therapy (Signed)
 OUTPATIENT PHYSICAL THERAPY LOWER EXTREMITY TREATMENT   Patient Name: Shannon Chung MRN: [id number] DOB:[birth year], 83 y.o., male Today's Date: 01/28/2024  END OF SESSION:  PT End of Session - 01/27/24 1622     Visit Number 14    Number of Visits 24    Date for PT Re-Evaluation 03/07/24    Progress Note Due on Visit 20    PT Start Time 1617    PT Stop Time 1658    PT Time Calculation (min) 41 min    Equipment Utilized During Treatment Gait belt    Activity Tolerance Patient tolerated treatment well    Behavior During Therapy WFL for tasks assessed/performed                      Past Medical History:  Diagnosis Date   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 06/2015 Cardiac CT: Ca score 1103 (84th %'ile);  b. 07/2015 Cath: LM 70, LAD 80p, 100/38m, D1 70, D2 95, RI 75, RCA 100p/m;  c. 07/2015 CABG x 5 (LIMA->LAD, VG->Diag, VG->OM1->OM2, VG->OM3).   COVID-19    12/2021   COVID-19 01/18/2022   COVID-19 vaccine administered 01/18/2022   Unknown how many vaccine doses have been received. Entered from Emergency Triage Note.   Diabetes mellitus without complication (HCC) 07/2015   Dyslipidemia    Essential hypertension    Essential hypertension 01/02/2015   Formatting of this note might be different from the original.  Last Assessment & Plan:   Chronic, stable. Continue current regimen.   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 dilated, EF 35% improved to 45-50% (2015);  b. 07/2015 EF 25-35% by LV gram.   Ischemic cardiomyopathy 01/02/2015   Kidney stones 04/17/2021   Lone atrial fibrillation (HCC) 1983   a. isolated episode, not on OAC.   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 06/2014: LV; resolved with coumadin-->no residual on f/u echo, no longer on coumadin.   Mural thrombus of heart 08/26/2021   Formatting of this note might be different from the original. Jun 16, 2019 Entered By: [name] Comment: left ventricle, cardiac apex   Osteoarthritis    a. 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 Jun 16, 2019 Entered By: [name] Comment: leftAug 19, 2020 Entered By: [name] Comment: resolved by total left knee replacement Jun 16, 2019 Entered By: [name] Comment: leftAug 19, 2020 Entered By: [name] Comment: resolved by total left knee replacement   Trigger finger of left hand 07/07/2019   Vitamin D deficiency    Past Surgical History:  Procedure Laterality Date   BICEPS TENDON REPAIR Right 1993   CARDIAC CATHETERIZATION N/A 07/05/2015   Procedure: Left Heart Cath and Coronary Angiography;  Surgeon: Antonieta Iba, MD;  Location: [facility] INVASIVE CV LAB;  Service: Cardiovascular;  Laterality: N/A;   CAROTID PTA/STENT INTERVENTION Left 04/26/2023   Procedure: CAROTID PTA/STENT INTERVENTION;  Surgeon: Annice Needy, MD;  Location: [facility] INVASIVE CV LAB;  Service: Cardiovascular;  Laterality: Left;   CATARACT EXTRACTION Left 12/2016   with keratoplasty   COLONOSCOPY  2007   COLONOSCOPY WITH PROPOFOL N/A 12/02/2017   TA, SSA, rpt 3 yrs(Tahiliani, Varnita B, MD)   COLONOSCOPY WITH PROPOFOL N/A 11/26/2020   Procedure: COLONOSCOPY WITH PROPOFOL;  Surgeon: Midge Minium, MD;  Location: [facility] ENDOSCOPY;  Service: Endoscopy;  Laterality: N/A;   CORONARY ARTERY BYPASS GRAFT N/A 07/29/2015   Procedure: CORONARY ARTERY BYPASS GRAFTING (CABG) x 5 (LIMA to LAD, SVG to DIAGONAL,  SVG SEQUENTIALLY to OM1 and OM2, SVG to OM3) with Endoscopic Vein Havesting of  GREATER SAPHENOUS VEIN from RIGHT THIGH and partial LOWER LEG ;  Surgeon: Alleen Borne, MD;  Location: [facility] OR;  Service: Open Heart Surgery;  Laterality: N/A;   EYE SURGERY     b/l cataract and cornea replaced    HAND SURGERY     left hand 1st/2nd trigger fingers Dr. Hyacinth Meeker ortho    JOINT REPLACEMENT     KNEE ARTHROSCOPY Left remote   MOHS SURGERY     left cheek scc 2022  Dr. Jeannine Boga   MOHS SURGERY     x 5 facial scc   right biceps tendon     repair/re attachment    SKIN CANCER EXCISION  10/2015   BCC - L ala (pending MOHs) and L scapula (complete excision)   TEE WITHOUT CARDIOVERSION N/A 07/29/2015   Procedure: TRANSESOPHAGEAL ECHOCARDIOGRAM (TEE);  Surgeon: Alleen Borne, MD;  [facility] OR;  Service: Open Heart Surgery;  Laterality: N/A;   TONSILLECTOMY  1949   TOTAL KNEE ARTHROPLASTY Left 03/18/2016   cemented L TKR; Deeann Saint, MD   Patient Active Problem List   Diagnosis Date Noted   Annual physical exam 07/10/2023  TIA (transient ischemic attack) 05/02/2023   GERD without esophagitis 05/02/2023   Carotid stenosis, symptomatic, with infarction (HCC) 04/26/2023   History of CVA (cerebrovascular accident) without residual deficits 03/04/2023   Acute CVA (cerebrovascular accident) (HCC) 02/22/2023   Chronic radicular pain of lower back 08/10/2022   Cervical spondylosis 03/11/2022   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   Bilateral carotid artery stenosis 03/11/2022   Lumbar spondylosis 10/07/2021   DDD (degenerative disc disease), lumbar 10/07/2021   History of radiation therapy 08/26/2021   Aortic atherosclerosis (HCC) 04/17/2021   Diverticulosis 04/17/2021   Hypertension associated with diabetes (HCC) 10/07/2020   Overweight (BMI 25.0-29.9) 10/07/2020   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   Vitamin D deficiency 08/01/2020   Polyp of sigmoid colon 08/01/2020   Insomnia 06/07/2020   Gastroesophageal reflux disease 04/04/2020   Degenerative joint disease of hand 03/01/2020   BPH (benign prostatic hyperplasia) 08/05/2018   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   Macular scar of both eyes 04/20/2018   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   Scotoma involving central area of both eyes 04/20/2018   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   Fatty liver 03/25/2018   Erectile dysfunction 02/23/2017   Hx of CABG 01/24/2017   Advanced care  planning/counseling discussion 10/21/2015   Coronary artery disease of native artery of native heart with stable angina pectoris (HCC)    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   Dyslipidemia 01/02/2015   Osteoarthritis 01/02/2015   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/02/2014    PCP: Dana Allan, MD  REFERRING PROVIDER: Kennedy Bucker, MD  REFERRING DIAG:  S76.019A (ICD-10-CM) - Rupture of hip abductor tendon  S70.01XA (ICD-10-CM) - Contusion of right hip    THERAPY DIAG:  Abnormality of gait and mobility  Muscle weakness (generalized)  Bilateral low back pain without sciatica, unspecified chronicity  Pain in left hip  Rationale for Evaluation and Treatment: Rehabilitation  ONSET DATE: 08/09/2023  SUBJECTIVE:   SUBJECTIVE STATEMENT:  Patient reports sore from wearing ankles weight with household walking at home. "Think I might have over done it."   From Eval:  Fall at dtr house at steps on 08/09/2023- Contusion of right hip and rupture of hip abductor tendon. Conservative treatment and patient reports doing better. States leg is weak with some difficulty walking yet no pain.   PERTINENT HISTORY: The patient sustained a fall while visiting family in [place] back in Oct 2024 and pain has improved but reports still feeling week.    PAIN:  Are you having pain? No  PRECAUTIONS: Fall  RED FLAGS: None   WEIGHT BEARING RESTRICTIONS: No  FALLS:  Has patient fallen in last 6 months? Yes. 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  DIAGNOSTIC FINDINGS:  Narrative & Impression  CLINICAL DATA:  Larey Seat 1 month ago. Persistent right hip pain and difficulty walking.   EXAM: MR OF THE RIGHT HIP WITHOUT CONTRAST   TECHNIQUE: Multiplanar, multisequence MR imaging was performed. No intravenous contrast was administered.   COMPARISON:  None   FINDINGS: Abnormal marrow signal in 3 separate places in the right  hemipelvis. 3 cm area of abnormal T1 and T2 signal intensity in the right iliac bone adjacent to the SI joint without obvious fracture. Second much larger area of abnormal signal intensity in the superior acetabulum with suspected small nondisplaced fractures. Third area involves the ischiopubic junction on the right side with suspected fracture on the axial sequence. Findings are worrisome for underlying bone lesions and pathologic fractures. It is possible these are all traumatic but recommend CT scan for further evaluation and correlation with PSA level with history of prior prostate cancer.   Both hips are normally located. No hip fractures are identified. No bone lesions involving the hips. Age related degenerative changes. No joint effusion.   Bilateral peritrochanteric tendinopathy no tendon rupture or bursitis. The hamstring tendons are intact. Moderate tendinopathy on the left. No muscle tears, myositis or muscle mass. Age related fatty atrophy.   Advanced degenerative changes and scoliosis noted in the lower lumbar spine.   No significant intrapelvic abnormalities are identified. No pelvic or inguinal adenopathy.   IMPRESSION: 1. Abnormal marrow signal in 3 separate places in the right hemipelvis worrisome for underlying bone lesions and pathologic fractures. It is possible these are all traumatic but recommend CT scan for further evaluation and correlation with PSA level with history of prior prostate cancer. 2. No hip fractures are identified. Age related degenerative changes. 3.  Bilateral peritrochanteric tendinopathy but no tendon rupture or bursitis. 4. Advanced degenerative changes and scoliosis in the lower lumbar spine.     Electronically Signed   By: Rudie Meyer M.D.   On: 09/09/2023 16:39    PATIENT SURVEYS:  LEFS 57/80  COGNITION: Overall cognitive status: Within functional limits for tasks assessed     SENSATION: WFL  EDEMA:  None observed   PALPATION: No tenderness noted along lateral right hip  LOWER EXTREMITY ROM:  Active ROM Right eval Left eval  Hip flexion    Hip extension    Hip abduction    Hip adduction    Hip internal rotation    Hip external rotation    Knee flexion    Knee extension    Ankle dorsiflexion    Ankle plantarflexion    Ankle inversion    Ankle eversion     (Blank rows = not tested)  LOWER EXTREMITY MMT:  MMT Right eval Left eval  Hip flexion 4 5  Hip extension 4 5  Hip abduction 2+ 5  Hip adduction 5 5  Hip internal rotation 3+ 5  Hip external rotation 3+ 5  Knee flexion 5 5  Knee extension 5 5  Ankle dorsiflexion 5 5  Ankle plantarflexion    Ankle inversion    Ankle eversion     (Blank rows = not tested)  LOWER EXTREMITY SPECIAL TESTS:  Hip special tests: Trendelenburg test: positive , Thomas test: negative, and Ober's test: negative  FUNCTIONAL TESTS:  5 times sit to stand: 20.01 sec without UE support  10 meter walk test: 0.74 m/s Berg Balance Scale: To be assessed next visit  GAIT: Distance walked: approx 100 feet Assistive device utilized: None Level of assistance: SBA Comments: (+) trendelenburg and shuffling gait with decreased foot clearance  TREATMENT DATE: 01/27/2024   NMR:  (use of gait belt, CGA)  -Side step up - up onto 6" block then  opp LE stepping over block x 20 reps each LE -Step taps x 20 reps alt LE -Static stand (back foot on  airex pad and front foot on 6" step) with horizontal head turning -Dynamic marching on airex pad 3# x 20 reps (mild instability- did improve confidence with practice) -Dyanmic lateral side step up/over 1/2 foam roll -using blaze pods (random mode) to tap appropriate color x 5 trials     Therapeutic Activities: Dynamic activities designed to improve functional performance.  Sit to stand- with Eccentric control on descent (5 sec) x 12 reps  Toe walking in //  bars down and back x 5                        PATIENT EDUCATION:  Education details: Exercise technique Person educated: Patient Education method: Explanation, Demonstration, Tactile cues, Verbal cues, and Handouts Education comprehension: verbalized understanding, returned demonstration, verbal cues required, tactile cues required, and needs further education  HOME EXERCISE PROGRAM: Access Code: [email or web address] URL: [email or web address] Date: 12/14/2023 Prepared by: Maureen Ralphs  Exercises - Sidelying Hip Abduction  - 3 x weekly - 3 sets - 10 reps - 2 sec hold - Clamshell with Resistance  - 3 x weekly - 3 sets - 10 reps  ASSESSMENT:  CLINICAL IMPRESSION: Patient continues to present with good motivation and responsive to all interventions. He demo some progress with better eccentric control with VC today and able to demo good weight bearing and hip stabilization.  Pt will benefit from PT services to address deficits in strength, balance, and mobility in order to return to full function at home with decreased risk of falling.    OBJECTIVE IMPAIRMENTS: Abnormal gait, decreased activity tolerance, decreased balance, decreased coordination, decreased endurance, decreased knowledge of condition, decreased mobility, difficulty walking, decreased strength, and hypomobility.   ACTIVITY LIMITATIONS: lifting, bending, standing, squatting, stairs, and transfers  PARTICIPATION LIMITATIONS: cleaning,  laundry, shopping, community activity, occupation, and yard work  PERSONAL FACTORS: Age and 1-2 comorbidities: CVA, Prostate CA  are also affecting patient's functional outcome.   REHAB POTENTIAL: Good  CLINICAL DECISION MAKING: Evolving/moderate complexity  EVALUATION COMPLEXITY: Moderate   GOALS: Goals reviewed with patient? Yes  SHORT TERM GOALS: Target date: 01/25/2024 Pt will be independent with HEP in order to  increase strength/balance in order to improvefunction at home and work. Baseline: EVAL- No formal HEP in place; 01/17/2024= Patient reports having good knowledge of current HEP and no questions at this time. Goal status: MET   LONG TERM GOALS: Target date: 03/07/2024  Pt will increase LEFS by at least 9 points in order to demonstrate significant improvement in lower extremity function.   Baseline: EVAL= 57/80 Goal status: INITIAL  2.  Patient will demonstrate improved right Hip Abd strength as seen by ability to achieve Full ROM in sidelye position Baseline: EVAL- Minimal able to raise R LE in sidelye; 01/17/2024= Patient able to raise R LE to full ROM in sidelye hip ABD Goal status: MET  3.  Patient (> 35 years old) will complete five times sit to stand test in < 15 seconds indicating an increased LE strength and improved balance. Baseline: EVAL= 20.01 sec without UE support; 01/17/2024= 18.7 sec without UE support Goal status: PROGRESSING    4.  Patient will increase Berg Balance score by > 6 points to demonstrate  decreased fall risk during functional activities. Baseline: EVAL: to be assessed visit #2; 12/16/2023= 43/56; 01/17/2024=49/56 Goal status: PROGRESSING    5.   Patient will increase 10 meter walk test to >1.34m/s as to improve gait speed for better community ambulation and to reduce fall risk. Baseline: EVAL= 0.74; 01/17/2024= 1.0 m/s Goal status: Progressing   6.   Patient will increase six minute walk test distance to >1000 for progression to community  ambulator and improve gait ability Baseline: EVAL -to be assessed visit #2; 12/16/2023= 765 feet; 01/17/2024= 955 feet  Goal status: Progressing    PLAN:  PT FREQUENCY: 1-2x/week  PT DURATION: 12 weeks  PLANNED INTERVENTIONS: 97164- PT Re-evaluation, 97110-Therapeutic exercises, 97530- Therapeutic activity, 97112- Neuromuscular re-education, 97535- Self Care, 16109- Manual therapy, [phone]- Gait training, [phone]- Orthotic Fit/training, [phone]- Electrical stimulation (manual), Patient/Family education, Balance training, Stair training, Taping, Dry Needling, Joint mobilization, Joint manipulation, Spinal manipulation, Spinal mobilization, DME instructions, Cryotherapy, and Moist heat  PLAN FOR NEXT SESSION:  Continue with progressive therex for Hip strengthening    Lenda Kelp, PT 01/28/2024, 8:21 AM

## 2024-01-27 NOTE — Telephone Encounter (Signed)
 Placed in Dr. Clent Ridges box to be filled out.

## 2024-01-31 NOTE — Therapy (Signed)
 OUTPATIENT PHYSICAL THERAPY LOWER EXTREMITY TREATMENT   Patient Name: Shannon Chung MRN: [id number] DOB:06/12/1941, 83 y.o., male Today's Date: 02/01/2024  END OF SESSION:  PT End of Session - 02/01/24 0853     Visit Number 15    Number of Visits 24    Date for PT Re-Evaluation 03/07/24    Progress Note Due on Visit 20    PT Start Time 0848    PT Stop Time 0928    PT Time Calculation (min) 40 min    Equipment Utilized During Treatment Gait belt    Activity Tolerance Patient tolerated treatment well    Behavior During Therapy WFL for tasks assessed/performed                       Past Medical History:  Diagnosis Date   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 06/2015 Cardiac CT: Ca score 1103 (84th %'ile);  b. 07/2015 Cath: LM 70, LAD 80p, 100/31m, D1 70, D2 95, RI 75, RCA 100p/m;  c. 07/2015 CABG x 5 (LIMA->LAD, VG->Diag, VG->OM1->OM2, VG->OM3).   COVID-19    12/2021   COVID-19 01/18/2022   COVID-19 vaccine administered 01/18/2022   Unknown how many vaccine doses have been received. Entered from Emergency Triage Note.   Diabetes mellitus without complication (HCC) 07/2015   Dyslipidemia    Essential hypertension    Essential hypertension 01/02/2015   Formatting of this note might be different from the original.  Last Assessment & Plan:   Chronic, stable. Continue current regimen.   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 dilated, EF 35% improved to 45-50% (2015);  b. 07/2015 EF 25-35% by LV gram.   Ischemic cardiomyopathy 01/02/2015   Kidney stones 04/17/2021   Lone atrial fibrillation (HCC) 1983   a. isolated episode, not on OAC.   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 06/2014: LV; resolved with coumadin-->no residual on f/u echo, no longer on coumadin.   Mural thrombus of heart 08/26/2021   Formatting of this note might be different from the original.  Jun 16, 2019 Entered By: [name] Comment: left ventricle, cardiac apex   Osteoarthritis    a. 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 Jun 16, 2019 Entered By: [name] Comment: leftAug 19, 2020 Entered By: [name] Comment: resolved by total left knee replacement Jun 16, 2019 Entered By: [name] Comment: leftAug 19, 2020 Entered By: [name] Comment: resolved by total left knee replacement   Trigger finger of left hand 07/07/2019   Vitamin D deficiency    Past Surgical History:  Procedure Laterality Date   BICEPS TENDON REPAIR Right 1993   CARDIAC CATHETERIZATION N/A 07/05/2015   Procedure: Left Heart Cath and Coronary Angiography;  Surgeon: Antonieta Iba, MD;  Location: [facility] INVASIVE CV LAB;  Service: Cardiovascular;  Laterality: N/A;   CAROTID PTA/STENT INTERVENTION Left 04/26/2023   Procedure: CAROTID PTA/STENT INTERVENTION;  Surgeon: Annice Needy, MD;  Location: [facility] INVASIVE CV LAB;  Service: Cardiovascular;  Laterality: Left;   CATARACT EXTRACTION Left 12/2016   with keratoplasty   COLONOSCOPY  2007   COLONOSCOPY WITH PROPOFOL N/A 12/02/2017   TA, SSA, rpt 3 yrs(Tahiliani, Varnita B, MD)   COLONOSCOPY WITH PROPOFOL N/A 11/26/2020   Procedure: COLONOSCOPY WITH PROPOFOL;  Surgeon: Midge Minium, MD;  Location: [facility] ENDOSCOPY;  Service: Endoscopy;  Laterality: N/A;   CORONARY ARTERY BYPASS GRAFT N/A 07/29/2015   Procedure: CORONARY ARTERY BYPASS GRAFTING (CABG) x 5 (LIMA to LAD, SVG to DIAGONAL,  SVG SEQUENTIALLY to OM1 and OM2, SVG to OM3) with Endoscopic Vein Havesting of  GREATER SAPHENOUS VEIN from RIGHT THIGH and partial LOWER LEG ;  Surgeon: Alleen Borne, MD;  Location: [facility] OR;  Service: Open Heart Surgery;  Laterality: N/A;   EYE SURGERY     b/l cataract and cornea replaced    HAND SURGERY     left hand 1st/2nd trigger fingers Dr. Hyacinth Meeker ortho    JOINT REPLACEMENT     KNEE ARTHROSCOPY Left remote   MOHS SURGERY     left cheek scc 2022  Dr. Jeannine Boga   MOHS SURGERY     x 5 facial scc   right biceps tendon     repair/re attachment    SKIN CANCER EXCISION  10/2015   BCC - L ala (pending MOHs) and L scapula (complete excision)   TEE WITHOUT CARDIOVERSION N/A 07/29/2015   Procedure: TRANSESOPHAGEAL ECHOCARDIOGRAM (TEE);  Surgeon: Alleen Borne, MD;  Location: [facility] OR;  Service: Open Heart Surgery;  Laterality: N/A;   TONSILLECTOMY  1949   TOTAL KNEE ARTHROPLASTY Left 03/18/2016   cemented L TKR; Deeann Saint, MD   Patient Active Problem List   Diagnosis Date Noted   Annual physical exam  07/10/2023   TIA (transient ischemic attack) 05/02/2023   GERD without esophagitis 05/02/2023   Carotid stenosis, symptomatic, with infarction (HCC) 04/26/2023   History of CVA (cerebrovascular accident) without residual deficits 03/04/2023   Acute CVA (cerebrovascular accident) (HCC) 02/22/2023   Chronic radicular pain of lower back 08/10/2022   Cervical spondylosis 03/11/2022   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   Bilateral carotid artery stenosis 03/11/2022   Lumbar spondylosis 10/07/2021   DDD (degenerative disc disease), lumbar 10/07/2021   History of radiation therapy 08/26/2021   Aortic atherosclerosis (HCC) 04/17/2021   Diverticulosis 04/17/2021   Hypertension associated with diabetes (HCC) 10/07/2020   Overweight (BMI 25.0-29.9) 10/07/2020   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   Vitamin D deficiency 08/01/2020   Polyp of sigmoid colon 08/01/2020   Insomnia 06/07/2020   Gastroesophageal reflux disease 04/04/2020   Degenerative joint disease of hand 03/01/2020   BPH (benign prostatic hyperplasia) 08/05/2018   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   Macular scar of both eyes 04/20/2018   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   Scotoma involving central area of both eyes 04/20/2018   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   Fatty liver 03/25/2018   Erectile dysfunction 02/23/2017   Hx of CABG 01/24/2017   Advanced care  planning/counseling discussion 10/21/2015   Coronary artery disease of native artery of native heart with stable angina pectoris (HCC)    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 Going on a trip over the weekend and probably start back volunteering in next 2 weeks.   From Eval:  Fall [place] on 08[place]09[place]2023- Contusion of right hip and rupture of hip abductor tendon. Conservative treatment and patient reports doing better. States leg is weak with some difficulty walking yet no pain.   PERTINENT HISTORY: The patient sustained a fall while visiting family in [place] back in Oct 2024 and pain has improved but reports still feeling week.    PAIN:  Are you having pain? No  PRECAUTIONS: Fall  RED FLAGS: None   WEIGHT BEARING RESTRICTIONS: No  FALLS:  Has patient fallen in last 6 months? Yes. 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  DIAGNOSTIC FINDINGS:  Narrative & Impression  CLINICAL DATA:  Larey Seat 1 month ago. Persistent right hip pain and difficulty walking.   EXAM: MR OF THE RIGHT HIP WITHOUT CONTRAST   TECHNIQUE: Multiplanar, multisequence MR imaging was performed. No intravenous contrast was administered.   COMPARISON:  None   FINDINGS: Abnormal marrow signal  in 3 separate places in the right hemipelvis. 3 cm area of abnormal T1 and T2 signal intensity in the right iliac bone adjacent to the SI joint without obvious fracture. Second much larger area of abnormal signal intensity in the superior acetabulum with suspected small nondisplaced fractures. Third area involves the ischiopubic junction on the right side with suspected fracture on the axial sequence. Findings are worrisome for underlying bone lesions and pathologic fractures. It is possible these are all traumatic but recommend CT scan for further evaluation and correlation with PSA level with history of prior prostate cancer.   Both hips are normally located. No hip fractures are identified. No bone lesions involving the hips. Age related degenerative changes. No joint effusion.   Bilateral peritrochanteric tendinopathy no tendon rupture or bursitis. The hamstring tendons are intact. Moderate tendinopathy on the left. No muscle tears, myositis or muscle mass. Age related fatty atrophy.   Advanced degenerative changes and scoliosis noted in the lower lumbar spine.   No significant intrapelvic abnormalities are identified. No pelvic or inguinal adenopathy.   IMPRESSION: 1. Abnormal marrow signal in 3 separate places in the right hemipelvis worrisome for underlying bone lesions and pathologic fractures. It is possible these are all traumatic but recommend CT scan for further evaluation and correlation with PSA level with history of prior prostate cancer. 2. No hip fractures are identified. Age related degenerative changes. 3.  Bilateral peritrochanteric tendinopathy but no tendon rupture or bursitis. 4. Advanced degenerative changes and scoliosis in the lower lumbar spine.     Electronically Signed   By: Rudie Meyer M.D.   On: 09/09/2023 16:39    PATIENT SURVEYS:  LEFS 57/80  COGNITION: Overall cognitive status: Within functional limits for tasks assessed     SENSATION: WFL  EDEMA:  None observed   PALPATION: No tenderness noted along lateral right hip  LOWER EXTREMITY ROM:  Active ROM Right eval Left eval  Hip flexion    Hip extension    Hip abduction    Hip adduction    Hip internal rotation    Hip external rotation    Knee flexion    Knee extension    Ankle dorsiflexion    Ankle plantarflexion    Ankle inversion    Ankle eversion     (Blank rows = not tested)  LOWER EXTREMITY MMT:  MMT Right eval Left eval  Hip flexion 4 5  Hip extension 4 5  Hip abduction 2+ 5  Hip adduction 5 5  Hip internal rotation 3+ 5  Hip external rotation 3+ 5  Knee flexion 5 5  Knee extension 5 5  Ankle dorsiflexion 5 5  Ankle plantarflexion    Ankle inversion    Ankle eversion     (Blank rows = not tested)  LOWER EXTREMITY SPECIAL TESTS:  Hip special tests: Trendelenburg test: positive , Thomas test: negative, and Ober's test: negative  FUNCTIONAL TESTS:  5 times sit to stand: 20.01 sec without UE support  10 meter walk test: 0.74 m/s Berg Balance Scale: To be assessed next visit  GAIT: Distance walked: approx 100 feet Assistive device utilized: None Level of assistance: SBA Comments: (+) trendelenburg and shuffling gait with decreased foot clearance  TREATMENT DATE: 02/01/2024   NMR:  (use of gait belt, CGA)  -Dynamic forward step up/over 1/2 foam roll  x 20 reps ea LE -Dynamic marching on airex pad x 20 reps alt LE with no UE SUpport     Therapeutic Activities: Dynamic activities designed to improve functional performance.  -Sit to stand- with Eccentric control 2 x 10 reps  -Side step- wide- in partial squat position - down and back along // bars- x 4. -Forward/Reverse lunge in // bars- down and back x 2 alt LE -Calf raises- on 1/2 foam roll x 15 reps BLE                        PATIENT EDUCATION:  Education details: Exercise technique Person educated: Patient Education method: Explanation, Demonstration, Tactile cues, Verbal cues, and Handouts Education comprehension: verbalized understanding, returned demonstration, verbal cues required, tactile cues required, and needs further education  HOME EXERCISE PROGRAM: Access Code: [email or web address] URL: [email or web address] Date: 12/14/2023 Prepared by: Maureen Ralphs  Exercises - Sidelying Hip Abduction  - 3 x weekly - 3 sets - 10 reps - 2 sec hold - Clamshell with Resistance  - 3 x weekly - 3 sets - 10 reps  ASSESSMENT:  CLINICAL IMPRESSION: Patient progressing very well overall- taking larger steps and much improve coordination with activities. He presents with improving functional strength and not limited by fatigue. Discussed taking 1 week to practice HEP and then return to PT on 4/10 for potential discharge visit. Patient in agreement with this plan.   OBJECTIVE IMPAIRMENTS: Abnormal gait, decreased activity tolerance, decreased balance, decreased coordination, decreased endurance, decreased knowledge of condition, decreased mobility, difficulty walking, decreased strength, and hypomobility.   ACTIVITY LIMITATIONS: lifting, bending, standing, squatting, stairs, and transfers  PARTICIPATION LIMITATIONS: cleaning, laundry, shopping, community activity, occupation, and yard work  PERSONAL FACTORS: Age and 1-2 comorbidities: CVA, Prostate CA  are also affecting patient's functional outcome.   REHAB POTENTIAL: Good  CLINICAL DECISION  MAKING: Evolving/moderate complexity  EVALUATION COMPLEXITY: Moderate   GOALS: Goals reviewed with patient? Yes  SHORT TERM GOALS: Target date: 01/25/2024 Pt will be independent with HEP in order to  increase strength/balance in order to improvefunction at home and work. Baseline: EVAL- No formal HEP in place; 01/17/2024= Patient reports having good knowledge of current HEP and no questions at this time. Goal status: MET   LONG TERM GOALS: Target date: 03/07/2024  Pt will increase LEFS by at least 9 points in order to demonstrate significant improvement in lower extremity function.   Baseline: EVAL= 57/80 Goal status: INITIAL  2.  Patient will demonstrate improved right Hip Abd strength as seen by ability to achieve Full ROM in sidelye position Baseline: EVAL- Minimal able to raise R LE in sidelye; 01/17/2024= Patient able to raise R LE to full ROM in sidelye hip ABD Goal status: MET  3.  Patient (> 61 years old) will complete five times sit to stand test in < 15 seconds indicating an increased LE strength and improved balance. Baseline: EVAL= 20.01 sec without UE support; 01/17/2024= 18.7 sec without UE support Goal status: PROGRESSING    4.  Patient will increase Berg Balance score by > 6 points to demonstrate decreased fall risk during functional activities. Baseline: EVAL: to be assessed visit #2; 12/16/2023= 43/56; 01/17/2024=49/56 Goal status: PROGRESSING    5.   Patient will increase 10 meter walk test to >1.64m/s as to improve gait speed for better community ambulation and  to reduce fall risk. Baseline: EVAL= 0.74; 01/17/2024= 1.0 m/s Goal status: Progressing   6.   Patient will increase six minute walk test distance to >1000 for progression to community ambulator and improve gait ability Baseline: EVAL -to be assessed visit #2; 12/16/2023= 765 feet; 01/17/2024= 955 feet  Goal status: Progressing    PLAN:  PT FREQUENCY: 1-2x/week  PT DURATION: 12 weeks  PLANNED  INTERVENTIONS: 97164- PT Re-evaluation, 97110-Therapeutic exercises, 97530- Therapeutic activity, 97112- Neuromuscular re-education, 97535- Self Care, 16109- Manual therapy, [id number]- Gait training, [phone]- Orthotic Fit/training, [phone]- Electrical stimulation (manual), Patient/Family education, Balance training, Stair training, Taping, Dry Needling, Joint mobilization, Joint manipulation, Spinal manipulation, Spinal mobilization, DME instructions, Cryotherapy, and Moist heat  PLAN FOR NEXT SESSION:  REVIEW HEP and reassess goals- plan to discharge if appropriate next visit.    Lenda Kelp, PT 02/01/2024, 1:50 PM

## 2024-02-01 ENCOUNTER — Ambulatory Visit: Payer: Medicare Other | Attending: Orthopedic Surgery

## 2024-02-01 DIAGNOSIS — R269 Unspecified abnormalities of gait and mobility: Secondary | ICD-10-CM | POA: Diagnosis present

## 2024-02-01 DIAGNOSIS — M25552 Pain in left hip: Secondary | ICD-10-CM | POA: Insufficient documentation

## 2024-02-01 DIAGNOSIS — M545 Low back pain, unspecified: Secondary | ICD-10-CM | POA: Diagnosis present

## 2024-02-01 DIAGNOSIS — M6281 Muscle weakness (generalized): Secondary | ICD-10-CM | POA: Diagnosis present

## 2024-02-02 ENCOUNTER — Other Ambulatory Visit: Payer: Self-pay | Admitting: Cardiovascular Disease

## 2024-02-03 ENCOUNTER — Ambulatory Visit: Payer: Medicare Other

## 2024-02-03 DIAGNOSIS — Z0279 Encounter for issue of other medical certificate: Secondary | ICD-10-CM

## 2024-02-03 NOTE — Telephone Encounter (Signed)
 Paperwork has been faxed, copy made, placed in chart, and pt has been notified that his copy in in the front office to be picked up.

## 2024-02-08 ENCOUNTER — Ambulatory Visit: Payer: Medicare Other | Admitting: Physical Therapy

## 2024-02-10 ENCOUNTER — Ambulatory Visit: Payer: Medicare Other

## 2024-02-10 DIAGNOSIS — R269 Unspecified abnormalities of gait and mobility: Secondary | ICD-10-CM | POA: Diagnosis not present

## 2024-02-10 DIAGNOSIS — M6281 Muscle weakness (generalized): Secondary | ICD-10-CM

## 2024-02-10 DIAGNOSIS — M25552 Pain in left hip: Secondary | ICD-10-CM

## 2024-02-10 DIAGNOSIS — M545 Low back pain, unspecified: Secondary | ICD-10-CM

## 2024-02-10 NOTE — Therapy (Signed)
 OUTPATIENT PHYSICAL THERAPY LOWER EXTREMITY TREATMENT/DISCHARGE SUMMARY   Patient Name: Josh Nicolosi MRN: [id number] DOB:Feb 11, 1941, 83 y.o., male Today's Date: 02/11/2024  END OF SESSION:  PT End of Session - 02/10/24 1619     Visit Number 16    Number of Visits 24    Date for PT Re-Evaluation 03/07/24    Progress Note Due on Visit 20    PT Start Time 1615    PT Stop Time 1657    PT Time Calculation (min) 42 min    Equipment Utilized During Treatment Gait belt    Activity Tolerance Patient tolerated treatment well    Behavior During Therapy WFL for tasks assessed/performed                       Past Medical History:  Diagnosis Date   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 06/2015 Cardiac CT: Ca score 1103 (84th %'ile);  b. 07/2015 Cath: LM 70, LAD 80p, 100/7m, D1 70, D2 95, RI 75, RCA 100p/m;  c. 07/2015 CABG x 5 (LIMA->LAD, VG->Diag, VG->OM1->OM2, VG->OM3).   COVID-19    12/2021   COVID-19 01/18/2022   COVID-19 vaccine administered 01/18/2022   Unknown how many vaccine doses have been received. Entered from Emergency Triage Note.   Diabetes mellitus without complication (HCC) 07/2015   Dyslipidemia    Essential hypertension    Essential hypertension 01/02/2015   Formatting of this note might be different from the original.  Last Assessment & Plan:   Chronic, stable. Continue current regimen.   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 dilated, EF 35% improved to 45-50% (2015);  b. 07/2015 EF 25-35% by LV gram.   Ischemic cardiomyopathy 01/02/2015   Kidney stones 04/17/2021   Lone atrial fibrillation (HCC) 1983   a. isolated episode, not on OAC.   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 06/2014: LV; resolved with coumadin-->no residual on f/u echo, no longer on coumadin.   Mural thrombus of heart 08/26/2021   Formatting of this note might be different from the  original. Jun 16, 2019 Entered By: [name] Comment: left ventricle, cardiac apex   Osteoarthritis    a. 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 Jun 16, 2019 Entered By: [name] Comment: leftAug 19, 2020 Entered By: [name] Comment: resolved by total left knee replacement Jun 16, 2019 Entered By: [name] Comment: leftAug 19, 2020 Entered By: [name] Comment: resolved by total left knee replacement   Trigger finger of left hand 07/07/2019   Vitamin D deficiency    Past Surgical History:  Procedure Laterality Date   BICEPS TENDON REPAIR Right 1993   CARDIAC CATHETERIZATION N/A 07/05/2015   Procedure: Left Heart Cath and Coronary Angiography;  Surgeon: Antonieta Iba, MD;  Location: [facility] INVASIVE CV LAB;  Service: Cardiovascular;  Laterality: N/A;   CAROTID PTA/STENT INTERVENTION Left 04/26/2023   Procedure: CAROTID PTA/STENT INTERVENTION;  Surgeon: Annice Needy, MD;  Location: [facility] INVASIVE CV LAB;  Service: Cardiovascular;  Laterality: Left;   CATARACT EXTRACTION Left 12/2016   with keratoplasty   COLONOSCOPY  2007   COLONOSCOPY WITH PROPOFOL N/A 12/02/2017   TA, SSA, rpt 3 yrs(Tahiliani, Varnita B, MD)   COLONOSCOPY WITH PROPOFOL N/A 11/26/2020   Procedure: COLONOSCOPY WITH PROPOFOL;  Surgeon: Midge Minium, MD;  Location[facility] ENDOSCOPY;  Service: Endoscopy;  Laterality: N/A;   CORONARY ARTERY BYPASS GRAFT N/A 07/29/2015   Procedure: CORONARY ARTERY BYPASS GRAFTING (CABG) x 5 (LIMA to LAD, SVG to DIAGONAL,  SVG SEQUENTIALLY to OM1 and OM2, SVG to OM3) with Endoscopic Vein Havesting of  GREATER SAPHENOUS VEIN from RIGHT THIGH and partial LOWER LEG ;  Surgeon: Alleen Borne, MD;  Location: [facility] OR;  Service: Open Heart Surgery;  Laterality: N/A;   EYE SURGERY     b/l cataract and cornea replaced    HAND SURGERY     left hand 1st/2nd trigger fingers Dr. Hyacinth Meeker ortho    JOINT REPLACEMENT     KNEE ARTHROSCOPY Left remote   MOHS SURGERY     left cheek scc  2022 Dr. Jeannine Boga   MOHS SURGERY     x 5 facial scc   right biceps tendon     repair/re attachment    SKIN CANCER EXCISION  10/2015   BCC - L ala (pending MOHs) and L scapula (complete excision)   TEE WITHOUT CARDIOVERSION N/A 07/29/2015   Procedure: TRANSESOPHAGEAL ECHOCARDIOGRAM (TEE);  Surgeon: Alleen Borne, MD;  Location: [facility] OR;  Service: Open Heart Surgery;  Laterality: N/A;   TONSILLECTOMY  1949   TOTAL KNEE ARTHROPLASTY Left 03/18/2016   cemented L TKR; Deeann Saint, MD   Patient Active Problem List   Diagnosis Date Noted   Annual physical  exam 07/10/2023   TIA (transient ischemic attack) 05/02/2023   GERD without esophagitis 05/02/2023   Carotid stenosis, symptomatic, with infarction (HCC) 04/26/2023   History of CVA (cerebrovascular accident) without residual deficits 03/04/2023   Acute CVA (cerebrovascular accident) (HCC) 02/22/2023   Chronic radicular pain of lower back 08/10/2022   Cervical spondylosis 03/11/2022   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   Bilateral carotid artery stenosis 03/11/2022   Lumbar spondylosis 10/07/2021   DDD (degenerative disc disease), lumbar 10/07/2021   History of radiation therapy 08/26/2021   Aortic atherosclerosis (HCC) 04/17/2021   Diverticulosis 04/17/2021   Hypertension associated with diabetes (HCC) 10/07/2020   Overweight (BMI 25.0-29.9) 10/07/2020   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   Vitamin D deficiency 08/01/2020   Polyp of sigmoid colon 08/01/2020   Insomnia 06/07/2020   Gastroesophageal reflux disease 04/04/2020   Degenerative joint disease of hand 03/01/2020   BPH (benign prostatic hyperplasia) 08/05/2018   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   Macular scar of both eyes 04/20/2018   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   Scotoma involving central area of both eyes 04/20/2018   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   Fatty liver 03/25/2018   Erectile dysfunction 02/23/2017   Hx of CABG 01/24/2017   Advanced  care planning/counseling discussion 10/21/2015   Coronary artery disease of native artery of native heart with stable angina pectoris (HCC)    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   Dyslipidemia 01/02/2015   Osteoarthritis 01/02/2015   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/02/2014    PCP: Dana Allan, MD  REFERRING PROVIDER: Kennedy Bucker, MD  REFERRING DIAG:  S76.019A (ICD-10-CM) - Rupture of hip abductor tendon  S70.01XA (ICD-10-CM) - Contusion of right hip    THERAPY DIAG:  Abnormality of gait and mobility  Bilateral low back pain without sciatica, unspecified chronicity  Muscle weakness (generalized)  Pain in left hip  Rationale for Evaluation and Treatment: Rehabilitation  ONSET DATE: 08/09/2023  SUBJECTIVE:   SUBJECTIVE STATEMENT: I had a busy trip- tried the walk with the family but the hills were too much but I did walk.  Overall I  feel like I am doing better- moving my feet without stumbling.    From Eval:  Fall at dtr house at steps on 08/09/2023- Contusion of right hip and rupture of hip abductor tendon. Conservative treatment and patient reports doing better. States leg is weak with some difficulty walking yet no pain.   PERTINENT HISTORY: The patient sustained a fall while visiting family in [place] back in Oct 2024 and pain has improved but reports still feeling week.    PAIN:  Are you having pain? No  PRECAUTIONS: Fall  RED FLAGS: None   WEIGHT BEARING RESTRICTIONS: No  FALLS:  Has patient fallen in last 6 months? Yes. 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  DIAGNOSTIC FINDINGS:  Narrative & Impression  CLINICAL DATA:  Larey Seat 1 month ago. Persistent right hip pain and difficulty walking.   EXAM: MR OF THE RIGHT HIP WITHOUT CONTRAST   TECHNIQUE: Multiplanar, multisequence MR imaging was performed. No intravenous  contrast was administered.   COMPARISON:  None   FINDINGS: Abnormal marrow signal in 3 separate places in the right hemipelvis. 3 cm area of abnormal T1 and T2 signal intensity in the right iliac bone adjacent to the SI joint without obvious fracture. Second much larger area of abnormal signal intensity in the superior acetabulum with suspected small nondisplaced fractures. Third area involves the ischiopubic junction on the right side with suspected fracture on the axial sequence. Findings are worrisome for underlying bone lesions and pathologic fractures. It is possible these are all traumatic but recommend CT scan for further evaluation and correlation with PSA level with history of prior prostate cancer.   Both hips are normally located. No hip fractures are identified. No bone lesions involving the hips. Age related degenerative changes. No joint effusion.   Bilateral peritrochanteric tendinopathy no tendon rupture or bursitis. The hamstring tendons are intact. Moderate tendinopathy on the left. No muscle tears, myositis or muscle mass. Age related fatty atrophy.   Advanced degenerative changes and scoliosis noted in the lower lumbar spine.   No significant intrapelvic abnormalities are identified. No pelvic or inguinal adenopathy.   IMPRESSION: 1. Abnormal marrow signal in 3 separate places in the right hemipelvis worrisome for underlying bone lesions and pathologic fractures. It is possible these are all traumatic but recommend CT scan for further evaluation and correlation with PSA level with history of prior prostate cancer. 2. No hip fractures  are identified. Age related degenerative changes. 3. Bilateral peritrochanteric tendinopathy but no tendon rupture or bursitis. 4. Advanced degenerative changes and scoliosis in the lower lumbar spine.     Electronically Signed   By: Rudie Meyer M.D.   On: 09/09/2023 16:39    PATIENT SURVEYS:  LEFS 57/80  COGNITION: Overall cognitive status: Within functional limits for tasks assessed     SENSATION: WFL  EDEMA:  None observed   PALPATION: No tenderness noted along lateral right hip  LOWER EXTREMITY ROM:  Active ROM Right eval Left eval  Hip flexion    Hip extension    Hip abduction    Hip adduction    Hip internal rotation    Hip external rotation    Knee flexion    Knee extension    Ankle dorsiflexion    Ankle plantarflexion    Ankle inversion    Ankle eversion     (Blank rows = not tested)  LOWER EXTREMITY MMT:  MMT Right eval Left eval  Hip flexion 4 5  Hip extension 4 5  Hip abduction 2+ 5  Hip adduction 5 5  Hip internal rotation 3+ 5  Hip external rotation 3+ 5  Knee flexion 5 5  Knee extension 5 5  Ankle dorsiflexion 5 5  Ankle plantarflexion    Ankle inversion    Ankle eversion     (Blank rows = not tested)  LOWER EXTREMITY SPECIAL TESTS:  Hip special tests: Trendelenburg test: positive , Thomas test: negative, and Ober's test: negative  FUNCTIONAL TESTS:  5 times sit to stand: 20.01 sec without UE support  10 meter walk test: 0.74 m/s Berg Balance Scale: To be assessed next visit  GAIT: Distance walked: approx 100 feet Assistive device utilized: None Level of assistance: SBA Comments: (+) trendelenburg and shuffling gait with decreased foot clearance  TREATMENT DATE: 02/01/2024   Physical therapy treatment session today consisted of completing assessment of goals and administration of testing as  demonstrated and documented in flow sheet, treatment, and goals section of this note. Addition treatments may be found below.   6 Min Walk Test:  Instructed patient to ambulate as quickly and as safely as possible for 6 minutes using LRAD. Patient was allowed to take standing rest breaks without stopping the test, but if the patient required a sitting rest break the clock would be stopped and the test would be over.  Results: 1105 feet (337 meters, Avg speed 0.94 m/s) using no device independently. Results indicate that the patient has reduced endurance with ambulation compared to age matched norms.  Age Matched Norms: 46-69 yo M: 48 F: 51, 78-79 yo M: 53 F: 471, 23-89 yo M: 417 F: 392 MDC: 58.21 meters (190.98 feet) or 50 meters (ANPTA Core Set of Outcome Measures for Adults with Neurologic Conditions, 2018)    [facility] PT Assessment - 02/10/24 1642       Berg Balance Test   Sit to Stand Able to stand without using hands and stabilize independently    Standing Unsupported Able to stand safely 2 minutes    Sitting with Back Unsupported but Feet Supported on Floor or Stool Able to sit safely and securely 2 minutes    Stand to Sit Sits safely with minimal use of hands    Transfers Able to transfer safely, minor use of hands    Standing Unsupported with Eyes Closed Able to stand 10 seconds safely    Standing Unsupported with Feet Together Able to place feet together independently and stand 1 minute safely    From Standing, Reach Forward with Outstretched Arm Can reach confidently >25 cm (10")    From Standing Position, Pick up Object from Floor Able to pick up shoe safely and easily    From Standing Position, Turn to Look Behind Over each Shoulder Looks behind from both sides and weight shifts well    Turn 360 Degrees Able to turn 360 degrees safely in 4 seconds or less    Standing Unsupported, Alternately Place Feet on Step/Stool Able to stand independently and safely and complete 8 steps in 20  seconds    Standing Unsupported, One Foot in Front Able to plae foot ahead of the other independently and hold 30 seconds    Standing on One Leg Tries to lift leg/unable to hold 3 seconds but remains standing independently    Total Score 52           10 Meter Walk Test: Patient instructed to walk 10 meters (32.8 ft) as quickly and as safely as possible at their normal speed x2 and at a fast speed x2. Time measured from 2 meter mark to 8 meter mark to accommodate ramp-up and ramp-down.  Normal speed 1: 1.0 m/s Normal speed 2: 1.0 m/s Average Normal speed: 1.0 m/s  Cut off scores: <0.4 m/s = household Ambulator, 0.4-0.8 m/s = limited community Ambulator, >0.8 m/s = community Ambulator, >1.2 m/s = crossing a street, <1.0 = increased fall risk MCID 0.05 m/s (small), 0.13 m/s (moderate), 0.06 m/s (significant)  (ANPTA Core Set of Outcome Measures for Adults with Neurologic Conditions, 2018)     Self-care/home management:   Reviewed all LE strengthening and balance activities performed in clinic- patient verbalized several activities and states that he and his wife will most likely return to [facility] for gym based exercises.  PATIENT EDUCATION:  Education details: Exercise technique Person educated: Patient Education method: Explanation, Demonstration, Tactile cues, Verbal cues, and Handouts Education comprehension: verbalized understanding, returned demonstration, verbal cues required, tactile cues required, and needs further education  HOME EXERCISE PROGRAM: Access Code: [email or web address] URL: [email or web address] Date: 12/14/2023 Prepared by: Maureen Ralphs  Exercises - Sidelying Hip Abduction  - 3 x weekly - 3 sets - 10 reps - 2 sec hold - Clamshell with Resistance  - 3 x weekly - 3 sets - 10 reps  ASSESSMENT:  CLINICAL IMPRESSION: Patient presents for last scheduled PT visit. He presents with much improved overall LE  strength and mobility. He is currently ambulating independently demonstrating much improved overall gait speed and improved 6 min walk test. He also improved with overall balance scoring a 52/56 on BERG with no reported falls. He is also walking with less shuffle and presents with pain free hip mobility with much improved strength. He has met all of his LTG and appropriate for discharge with plan to continue on his own at this time.    OBJECTIVE IMPAIRMENTS: Abnormal gait, decreased activity tolerance, decreased balance, decreased coordination, decreased endurance, decreased knowledge of condition, decreased mobility, difficulty walking, decreased strength, and hypomobility.   ACTIVITY LIMITATIONS: lifting, bending, standing, squatting, stairs, and transfers  PARTICIPATION LIMITATIONS: cleaning, laundry, shopping, community activity, occupation, and yard work  PERSONAL FACTORS: Age and 1-2 comorbidities: CVA, Prostate CA  are also affecting patient's functional outcome.   REHAB POTENTIAL: Good  CLINICAL DECISION MAKING: Evolving/moderate complexity  EVALUATION COMPLEXITY: Moderate   GOALS: Goals reviewed with patient? Yes  SHORT TERM GOALS: Target date: 01/25/2024 Pt will be independent with HEP in order to  increase strength/balance in order to improvefunction at home and work. Baseline: EVAL- No formal HEP in place; 01/17/2024= Patient reports having good knowledge of current HEP and no questions at this time. Goal status: MET   LONG TERM GOALS: Target date: 03/07/2024  Pt will increase LEFS by at least 9 points in order to demonstrate significant improvement in lower extremity function.   Baseline: EVAL= 57/80 Goal status: INITIAL  2.  Patient will demonstrate improved right Hip Abd strength as seen by ability to achieve Full ROM in sidelye position Baseline: EVAL- Minimal able to raise R LE in sidelye; 01/17/2024= Patient able to raise R LE to full ROM in sidelye hip ABD Goal status:  MET  3.  Patient (> 86 years old) will complete five times sit to stand test in < 15 seconds indicating an increased LE strength and improved balance. Baseline: EVAL= 20.01 sec without UE support; 01/17/2024= 18.7 sec without UE support; 02/10/2024= 11.18 sec without UE support Goal status: PROGRESSING    4.  Patient will increase Berg Balance score by > 6 points to demonstrate decreased fall risk during functional activities. Baseline: EVAL: to be assessed visit #2; 12/16/2023= 43/56; 01/17/2024=49/56 Goal status: PROGRESSING    5.   Patient will increase 10 meter walk test to >1.35m/s as to improve gait speed for better community ambulation and to reduce fall risk. Baseline: EVAL= 0.74; 01/17/2024= 1.0 m/s Goal status:   6.   Patient will increase six minute walk test distance to >1000 for progression to community ambulator and improve gait ability Baseline: EVAL -to be assessed visit #2; 12/16/2023= 765 feet; 01/17/2024= 955 feet; 02/10/2024= 1105 feet without an AD Goal status: MET   PLAN:  PT FREQUENCY: 1-2x/week  PT DURATION: 12 weeks  PLANNED INTERVENTIONS: 97164- PT Re-evaluation, 97110-Therapeutic exercises, 97530-  Therapeutic activity, [id number]- Neuromuscular re-education, [phone]- Self Care, 60454- Manual therapy, [phone]- Gait training, [phone]- Orthotic Fit/training, [phone]- Electrical stimulation (manual), Patient/Family education, Balance training, Stair training, Taping, Dry Needling, Joint mobilization, Joint manipulation, Spinal manipulation, Spinal mobilization, DME instructions, Cryotherapy, and Moist heat  PLAN FOR NEXT SESSION: Discharge today.     Lenda Kelp, PT 02/11/2024, 1:38 PM

## 2024-02-14 ENCOUNTER — Ambulatory Visit: Payer: Medicare Other

## 2024-02-14 ENCOUNTER — Ambulatory Visit (INDEPENDENT_AMBULATORY_CARE_PROVIDER_SITE_OTHER): Payer: Medicare Other

## 2024-02-14 DIAGNOSIS — I639 Cerebral infarction, unspecified: Secondary | ICD-10-CM | POA: Diagnosis not present

## 2024-02-14 LAB — CUP PACEART REMOTE DEVICE CHECK
Date Time Interrogation Session: 20250413232110
Implantable Pulse Generator Implant Date: 20240813

## 2024-02-15 ENCOUNTER — Encounter: Payer: Medicare Other | Admitting: Physical Therapy

## 2024-02-15 ENCOUNTER — Encounter: Payer: Self-pay | Admitting: Cardiology

## 2024-02-17 ENCOUNTER — Ambulatory Visit: Payer: Medicare Other

## 2024-02-17 ENCOUNTER — Other Ambulatory Visit: Payer: Medicare Other

## 2024-02-18 ENCOUNTER — Other Ambulatory Visit: Payer: Medicare Other

## 2024-02-21 ENCOUNTER — Ambulatory Visit: Payer: Medicare Other

## 2024-02-22 ENCOUNTER — Encounter: Payer: Medicare Other | Admitting: Physical Therapy

## 2024-02-22 ENCOUNTER — Ambulatory Visit: Payer: Medicare Other | Admitting: Urology

## 2024-02-24 ENCOUNTER — Ambulatory Visit: Payer: Medicare Other

## 2024-02-28 NOTE — Progress Notes (Signed)
 [facility] Summary Report / Loop Recorder

## 2024-03-06 ENCOUNTER — Ambulatory Visit
Admission: RE | Admit: 2024-03-06 | Discharge: 2024-03-06 | Disposition: A | Discharge status: Home / Self Care | Source: Ambulatory Visit | Attending: Urology | Admitting: Urology

## 2024-03-06 DIAGNOSIS — M25551 Pain in right hip: Secondary | ICD-10-CM | POA: Insufficient documentation

## 2024-03-06 DIAGNOSIS — R937 Abnormal findings on diagnostic imaging of other parts of musculoskeletal system: Secondary | ICD-10-CM | POA: Diagnosis present

## 2024-03-08 ENCOUNTER — Encounter: Payer: Self-pay | Admitting: Medical

## 2024-03-08 ENCOUNTER — Ambulatory Visit: Attending: Medical | Admitting: Medical

## 2024-03-08 VITALS — BP 100/58 | HR 70 | Ht 72.0 in | Wt 197.4 lb

## 2024-03-08 DIAGNOSIS — E782 Mixed hyperlipidemia: Secondary | ICD-10-CM

## 2024-03-08 DIAGNOSIS — I639 Cerebral infarction, unspecified: Secondary | ICD-10-CM

## 2024-03-08 DIAGNOSIS — I10 Essential (primary) hypertension: Secondary | ICD-10-CM | POA: Diagnosis not present

## 2024-03-08 DIAGNOSIS — I2510 Atherosclerotic heart disease of native coronary artery without angina pectoris: Secondary | ICD-10-CM

## 2024-03-08 DIAGNOSIS — Z95818 Presence of other cardiac implants and grafts: Secondary | ICD-10-CM

## 2024-03-08 NOTE — Patient Instructions (Signed)
 Medication Instructions:  Your Physician recommend you continue on your current medication as directed.    *If you need a refill on your cardiac medications before your next appointment, please call your pharmacy*  Lab Work: No labs ordered today  If you have labs (blood work) drawn today and your tests are completely normal, you will receive your results only by: MyChart Message (if you have MyChart) OR A paper copy in the mail If you have any lab test that is abnormal or we need to change your treatment, we will call you to review the results.  Testing/Procedures: No test ordered today   Follow-Up: At [facility], you and your health needs are our priority.  As part of our continuing mission to provide you with exceptional heart care, our providers are all part of one team.  This team includes your primary Cardiologist (physician) and Advanced Practice Providers or APPs (Physician Assistants and Nurse Practitioners) who all work together to provide you with the care you need, when you need it.  Your next appointment:   1 year(s)  Provider:   You may see Timothy Gollan, MD or one of the following Advanced Practice Providers on your designated Care Team:   Laneta Pintos, NP Gildardo Labrador, PA-C Varney Gentleman, PA-C Cadence El Duende, PA-C Ronald Cockayne, NP Morey Ar, NP    We recommend signing up for the patient portal called "MyChart".  Sign up information is provided on this After Visit Summary.  MyChart is used to connect with patients for Virtual Visits (Telemedicine).  Patients are able to view lab/test results, encounter notes, upcoming appointments, etc.  Non-urgent messages can be sent to your provider as well.   To learn more about what you can do with MyChart, go to [email or web address].

## 2024-03-08 NOTE — Progress Notes (Signed)
 Cardiology Office Note:  .   Date:  03/08/2024  ID:  Shannon Chung, DOB 03/16/1941, MRN [id number] PCP: Shannon Gaw, MD  [facility] Providers Cardiologist:  Shannon Boyden, MD Electrophysiologist:  Shannon Byes, MD {  History of Present Illness: .   Shannon Chung is a 83 y.o. male with a h/o CAD s/p CABG x5 in 2016, bilateral carotid stenosis, LV apical thrombus, ICM, frequent PVCs, HTN, HLD, carotid artery endarterectomy s/p L carotid endarterectomy, CVA, s/p ILR 06/2023, aortic atherosclerosis, DM2 who presents for follow-up.   The patient was first evaluated by Shannon Chung 01/2015 to establish care after moving to the area. He reported h/o ICM with EF 35%, Cardiac PET showing scar in the apical and periapical region, mural thrombus on anticoagulation in July 2015. He did not have a LHC at that time. Shannon Chung ordered a coronary CT which showed a calcium  score of 1103 and most calcium  in p-m LAD. LHC in September 2016 showed severe multivessel and LM disease so he was referred to CT surgery. He underwent CABG x5 in 07/2015. Most recent echo 11/2015 showed LVEF 55-60%, mild LVH, G1DD.   The patient underwent stroke work-up 02/2023 notable for 70% left carotid stenosis and negative MRI. She subsequently had left carotid endarterectomy and pos-op on 6/24 had expressive aphasia with word salad. She had recurrent episodes and presented to [facility] for each of them. She saw EP and underwent ILR implant 06/15/23.  Today, the patient reports he has been doing well. No afib detected so far. He denies chest pain, SOB, lower leg edema, lightheadedness, dizziness, heart racing, palpitations. BP is normally low. He is getting PSA check regularly. 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 He finished 8 weeks of PT. He eats most of his meals at home.    Studies Reviewed: Shannon Chung   EKG Interpretation Date/Time:  Wednesday Mar 08 2024 14:18:58 EDT Ventricular Rate:  70 PR Interval:  140 QRS  Duration:  96 QT Interval:  410 QTC Calculation: 442 R Axis:   5  Text Interpretation: Normal sinus rhythm Anterior infarct (cited on or before 13-May-2023) When compared with ECG of 13-May-2023 10:39, No significant change was found Confirmed by Shannon Chung, Shannon Chung ([id number]) on 03/08/2024 2:23:35 PM    Echo 02/2023 1. Left ventricular ejection fraction, by estimation, is >55%. The left  ventricle has normal function. Left ventricular endocardial border not  optimally defined to evaluate regional wall motion. There is mild left  ventricular hypertrophy. Left  ventricular diastolic parameters are consistent with Grade I diastolic  dysfunction (impaired relaxation).   2. Right ventricular systolic function was not well visualized. The right  ventricular size is normal. Tricuspid regurgitation signal is inadequate  for assessing PA pressure.   3. The mitral valve is grossly normal. No evidence of mitral valve  regurgitation. No evidence of mitral stenosis.   4. The aortic valve is tricuspid. There is mild thickening of the aortic  valve. Aortic valve regurgitation is not visualized. Aortic valve  sclerosis is present, with no evidence of aortic valve stenosis.        Physical Exam:   VS:  BP (!) 100/58   Pulse 70   Ht 6' (1.829 m)   Wt 197 lb 6.4 oz (89.5 kg)   SpO2 95%   BMI 26.77 kg/m    Wt Readings from Last 3 Encounters:  03/08/24 197 lb 6.4 oz (89.5 kg)  12/27/23 202 lb 2 oz (91.7 kg)  12/20/23 205  lb (93 kg)    GEN: Well nourished, well developed in no acute distress NECK: No JVD; No carotid bruits CARDIAC: RRR, no murmurs, rubs, gallops RESPIRATORY:  Clear to auscultation without rales, wheezing or rhonchi  ABDOMEN: Soft, non-tender, non-distended EXTREMITIES:  No edema; No deformity   ASSESSMENT AND PLAN: .    CAD s/p CABG x5 2016 The patient denies anginal symptoms. Lifestyle is overall healthy and active. No further ischemic work-up at this time. Continue ASA, Plavix ,  Lipitor , Zetia , and Metoprolol .   HTN BP today is soft, which he feels is not normal for him. Continue lisinopril  10mg  daily and Toprol  25mg  daily.   HLD LDL 59. Continue Lipitor  40mg  daily and Zetia  10mg  daily.   CVA s/p ILR No afib noted so far. Continue ASA, Plavix , Lipitor  and Zetia . This is followed by EP.        Dispo: Follow-up in 1 year  Signed, Shannon Chung Shannon [place], PA-C

## 2024-03-10 ENCOUNTER — Other Ambulatory Visit: Payer: Medicare Other

## 2024-03-10 DIAGNOSIS — N400 Enlarged prostate without lower urinary tract symptoms: Secondary | ICD-10-CM

## 2024-03-11 LAB — PSA: Prostate Specific Ag, Serum: 6.9 ng/mL — ABNORMAL HIGH (ref 0.0–4.0)

## 2024-03-14 ENCOUNTER — Other Ambulatory Visit: Payer: Self-pay | Admitting: Cardiovascular Disease

## 2024-03-14 ENCOUNTER — Other Ambulatory Visit: Payer: Self-pay | Admitting: Family Medicine

## 2024-03-14 DIAGNOSIS — E119 Type 2 diabetes mellitus without complications: Secondary | ICD-10-CM

## 2024-03-20 ENCOUNTER — Ambulatory Visit (INDEPENDENT_AMBULATORY_CARE_PROVIDER_SITE_OTHER): Payer: Medicare Other

## 2024-03-20 DIAGNOSIS — I639 Cerebral infarction, unspecified: Secondary | ICD-10-CM | POA: Diagnosis not present

## 2024-03-20 LAB — CUP PACEART REMOTE DEVICE CHECK
Date Time Interrogation Session: 20250518233324
Implantable Pulse Generator Implant Date: 20240813

## 2024-03-21 ENCOUNTER — Ambulatory Visit: Payer: Medicare Other | Admitting: Urology

## 2024-03-21 ENCOUNTER — Encounter (INDEPENDENT_AMBULATORY_CARE_PROVIDER_SITE_OTHER): Payer: Self-pay

## 2024-03-21 ENCOUNTER — Ambulatory Visit: Payer: Self-pay | Admitting: Cardiology

## 2024-03-21 ENCOUNTER — Encounter: Payer: Self-pay | Admitting: Urology

## 2024-03-21 VITALS — BP 136/70 | HR 74 | Ht 72.0 in | Wt 200.0 lb

## 2024-03-21 DIAGNOSIS — C61 Malignant neoplasm of prostate: Secondary | ICD-10-CM

## 2024-03-21 NOTE — Progress Notes (Signed)
 I,Shannon Chung,acting as a scribe for Shannon Gimenez, MD.,have documented all relevant documentation on the behalf of Shannon Gimenez, MD,as directed by  Shannon Gimenez, MD while in the presence of Shannon Gimenez, MD.  03/21/2024 12:30 PM   Shannon Chung February 13, 1941 [id number]  Referring provider: Valli Gaw, MD [address] 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   He has a personal history of a fall, which was followed up with an MRI performed by Dr. Mozell Arias concerning for possible pathologic fracture. He underwent further evaluation with PSMA PET scan that does in fact show focal uptake within the right acetabulum which does correspond with the abnormal marrow signal in comparison to the MRI concerning for metastatic bone disease. There's also uptake in his left prostate gland. Notably his PSA is only 6.9. (Please see previous notes for details). He was formerly managed at [facility] by Dr. Derrick Fling and ended up transferring here for convenience.   He has been under surveillance for prostate cancer, with a stable PSA level of 6.9. A recent PET scan showed uptake in the acetabulum, which was discussed at a Tumor Board. The consensus was that the uptake was likely traumatic with bone marrow edema, and the decision was made to follow PSA levels.   A hip MRI was completed on 03/06/2024, but it has not yet been read by a radiologist.   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 He denies using a cane or walker. He expresses confidence in the stability of his condition and is not experiencing any urinary problems.   He has a family history of prostate issues, as his grandfather had a similar condition but lived to an old age without symptoms.   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 06/2015 Cardiac CT: Ca score 1103 (84th %'ile);  b. 07/2015 Cath: LM 70, LAD 80p, 100/49m, D1 70, D2 95, RI 75, RCA 100p/m;  c. 07/2015 CABG x 5 (LIMA->LAD, VG->Diag, VG->OM1->OM2, VG->OM3).   COVID-19    12/2021   COVID-19 01/18/2022   COVID-19 vaccine administered 01/18/2022   Unknown how many vaccine doses have been received. Entered from Emergency Triage Note.   Diabetes mellitus without complication (HCC) 07/2015   Dyslipidemia    Essential hypertension    Essential hypertension 01/02/2015   Formatting of this note might be different from the original.  Last Assessment & Plan:   Chronic, stable. Continue current regimen.   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 dilated, EF 35% improved to 45-50% (2015);   b. 07/2015 EF 25-35% by LV gram.   Ischemic cardiomyopathy 01/02/2015   Kidney stones 04/17/2021   Lone atrial fibrillation (HCC) 1983   a. isolated episode, not on OAC.   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 06/2014: LV; resolved with coumadin-->no residual on f/u echo, no longer on coumadin.   Mural thrombus of heart 08/26/2021   Formatting of this note might be different from the original. Jun 16, 2019 Entered By: Glenice Lang Comment: left ventricle, cardiac apex   Osteoarthritis    a. R-shoulder, L-knee [facility])   Personal history of colonic polyps    Polyp of colon    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   PSA elevation 03/25/2018   Retention cyst of paranasal sinus 03/11/2022   Shoulder pain 12/23/2022   Jun 21, 2019 Entered By: Glenice Lang Comment: attributed to arthritis   Skin cancer    squamous and basal cell right forearm, SCC left cheek 10/04/20 sees derm regularly Dr. 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 Jun 16, 2019 Entered By: Glenice Lang Comment: leftAug 19, 2020 Entered By: Glenice Lang Comment: resolved by total left knee replacement Jun 16, 2019 Entered By: Glenice Lang Comment: leftAug 19, 2020 Entered By: Glenice Lang Comment: resolved by total left knee replacement   Trigger finger of left hand 07/07/2019   Vitamin D  deficiency     Surgical History: Past Surgical History:  Procedure Laterality Date   BICEPS TENDON REPAIR Right 1993   CARDIAC CATHETERIZATION N/A 07/05/2015   Procedure: Left  Heart Cath and Coronary Angiography;  Surgeon: Devorah Fonder, MD;  Location: [facility] INVASIVE CV LAB;  Service: Cardiovascular;  Laterality: N/A;   CAROTID PTA/STENT INTERVENTION Left 04/26/2023   Procedure: CAROTID PTA/STENT INTERVENTION;  Surgeon: Celso College, MD;  Location: [facility] INVASIVE CV LAB;  Service: Cardiovascular;  Laterality: Left;   CATARACT EXTRACTION Left 12/2016   with keratoplasty   COLONOSCOPY  2007   COLONOSCOPY WITH PROPOFOL  N/A 12/02/2017   TA, SSA, rpt 3 yrs(Tahiliani, Varnita B, MD)   COLONOSCOPY WITH PROPOFOL  N/A 11/26/2020   Procedure: COLONOSCOPY WITH PROPOFOL ;  Surgeon: Marnee Sink, MD;  Location: [facility] ENDOSCOPY;  Service: Endoscopy;  Laterality: N/A;   CORONARY ARTERY BYPASS GRAFT N/A 07/29/2015   Procedure: CORONARY ARTERY BYPASS GRAFTING (CABG) x 5 (LIMA to LAD, SVG to DIAGONAL,  SVG SEQUENTIALLY to OM1 and OM2, SVG to OM3) with Endoscopic Vein Havesting of  GREATER  SAPHENOUS VEIN from RIGHT THIGH and partial LOWER LEG ;  Surgeon: Bartley Lightning, MD;  Location: [facility] OR;  Service: Open Heart Surgery;  Laterality: N/A;   EYE SURGERY     b/l cataract and cornea replaced    HAND SURGERY     left hand 1st/2nd trigger fingers Dr. Annabell Key ortho    JOINT REPLACEMENT     KNEE ARTHROSCOPY Left remote   MOHS SURGERY     left cheek scc 2022 Dr. Gideon Kussmaul   MOHS SURGERY     x 5 facial scc   right biceps tendon     repair/re attachment    SKIN CANCER EXCISION  10/2015   BCC - L ala (pending MOHs) and L scapula (complete excision)   TEE WITHOUT CARDIOVERSION N/A 07/29/2015   Procedure: TRANSESOPHAGEAL ECHOCARDIOGRAM (TEE);  Surgeon: Bartley Lightning, MD;  Location: [facility] OR;  Service: Open Heart Surgery;  Laterality: N/A;   TONSILLECTOMY  1949   TOTAL KNEE ARTHROPLASTY Left 03/18/2016   cemented L TKR; Marlynn Singer, MD    Home Medications:  Allergies as of 03/21/2024       Reactions   Pollen Extract Itching        Medication List        Accurate as of Mar 21, 2024 12:30  PM. If you have any questions, ask your nurse or doctor.          acetaminophen  500 MG tablet Commonly known as: TYLENOL  Take 500 mg by mouth every 6 (six) hours as needed for mild pain.   aspirin  EC 81 MG tablet Take 162 mg by mouth daily. Swallow whole.   atorvastatin  40 MG tablet Commonly known as: LIPITOR  TAKE 1 TABLET BY MOUTH DAILY   Blood Pressure Kit Check blood pressure two to three times a week   clopidogrel  75 MG tablet Commonly known as: Plavix  Take 1 tablet (75 mg total) by mouth daily.   ezetimibe  10 MG tablet Commonly known as: ZETIA  TAKE 1 TABLET BY MOUTH DAILY   famotidine  20 MG tablet Commonly known as: PEPCID  TAKE 1 TABLET BY MOUTH 2 TIMES DAILY AS NEEDED FOR HEARTBURN/INDIGESTION. D/C ZANTAC    fluticasone  50 MCG/ACT nasal spray Commonly known as: FLONASE  Place 1 spray into both nostrils daily as needed for allergies.   levETIRAcetam  500 MG tablet Commonly known as: KEPPRA  Take 1 tablet (500 mg total) by mouth 2 (two) times daily.   lisinopril  10 MG tablet Commonly known as: ZESTRIL  Take 1 tablet (10 mg total) by mouth daily.   metFORMIN  500 MG tablet Commonly known as: GLUCOPHAGE  TAKE 1 TABLET BY MOUTH TWICE  DAILY WITH A MEAL   metoprolol  succinate 25 MG 24 hr tablet Commonly known as: TOPROL -XL TAKE 1 TABLET BY MOUTH ONCE  DAILY   prednisoLONE  acetate 1 % ophthalmic suspension Commonly known as: PRED FORTE  Place 1 drop into both eyes daily.   sildenafil  20 MG tablet Commonly known as: REVATIO  TAKE 1 TABLET BY MOUTH DAILY AS NEEDED   vitamin B-12 500 MCG tablet Commonly known as: CYANOCOBALAMIN  Take 500 mcg by mouth daily.   Vitamin D -3 125 MCG (5000 UT) Tabs Take 5,000 Units by mouth daily.        Allergies:  Allergies  Allergen Reactions   Pollen Extract Itching    Family History: Family History  Problem Relation Age of Onset   CAD Father 43       MI   Hypertension Father    Hyperlipidemia Father  Alcoholism  Father    Diabetes Father    Alcohol abuse Father    Heart disease Father    Cancer Daughter        dx'ed 82 retroperitoneal liposarcoma     Social History:  reports that he quit smoking about 45 years ago. His smoking use included cigarettes. He started smoking about 55 years ago. He has a 5 pack-year smoking history. He has never used smokeless tobacco. He reports that he does not currently use alcohol after a past usage of about 4.0 standard drinks of alcohol per week. He reports that he does not use drugs.   Physical Exam: BP 136/70   Pulse 74   Ht 6' (1.829 m)   Wt 200 lb (90.7 kg)   BMI 27.12 kg/m   Constitutional:  Alert and oriented, No acute distress. HEENT: [place] AT, moist mucus membranes.  Trachea midline, no masses. Neurologic: Grossly intact, no focal deficits, moving all 4 extremities. Psychiatric: Normal mood and affect.   Pertinent Imaging: Hip MRI performed on 03/06/2024. Images personally viewed but outside scope of my practice to interpret. Awaiting final radioligic interpretation and comparison to previous.    Assessment & Plan:    1. High-risk prostate cancer - PSA remains stable at 6.9, with no indication of progression.  - The plan is to continue monitoring PSA levels, with the next test scheduled in six months.  -f/u MRI  to ensure stability (ie no progression).  As per previous discussion, feel that this is likely traumatic.    He will return for a follow-up in one year unless there are changes in urinary symptoms or PSA levels.  Return in about 1 year (around 03/21/2025) for PSA, DRE.  I have reviewed the above documentation for accuracy and completeness, and I agree with the above.   Shannon Gimenez, MD   [facility] [address] [place], [place] [address] [phone]

## 2024-04-05 NOTE — Progress Notes (Signed)
 [facility] Summary Report / Loop Recorder

## 2024-04-11 ENCOUNTER — Other Ambulatory Visit: Payer: Self-pay | Admitting: Family Medicine

## 2024-04-11 DIAGNOSIS — N529 Male erectile dysfunction, unspecified: Secondary | ICD-10-CM

## 2024-04-11 NOTE — Telephone Encounter (Unsigned)
 Copied from CRM [id number]. Topic: Clinical - Medication Refill >> Apr 11, 2024 12:00 PM [place] E wrote: Medication: sildenafil  (REVATIO ) 20 MG tablet  Has the patient contacted their pharmacy? Yes (Agent: If no, request that the patient contact the pharmacy for the refill. If patient does not wish to contact the pharmacy document the reason why and proceed with request.) (Agent: If yes, when and what did the pharmacy advise?)  This is the patient's preferred pharmacy:   [facility] [id number] [place], [place] - [address] [place] [address] Phone: [phone] Fax: [phone]  Is this the correct pharmacy for this prescription? Yes If no, delete pharmacy and type the correct one.   Has the prescription been filled recently? No  Is the patient out of the medication? Yes  Has the patient been seen for an appointment in the last year OR does the patient have an upcoming appointment? Yes  Can we respond through MyChart? Yes  Agent: Please be advised that Rx refills may take up to 3 business days. We ask that you follow-up with your pharmacy.

## 2024-04-20 ENCOUNTER — Ambulatory Visit (INDEPENDENT_AMBULATORY_CARE_PROVIDER_SITE_OTHER)

## 2024-04-20 DIAGNOSIS — I639 Cerebral infarction, unspecified: Secondary | ICD-10-CM | POA: Diagnosis not present

## 2024-04-20 LAB — CUP PACEART REMOTE DEVICE CHECK
Date Time Interrogation Session: 20250618233106
Implantable Pulse Generator Implant Date: 20240813

## 2024-04-21 ENCOUNTER — Ambulatory Visit: Payer: Self-pay | Admitting: Cardiology

## 2024-05-11 NOTE — Progress Notes (Signed)
 [facility] Summary Report / Loop Recorder

## 2024-05-22 ENCOUNTER — Ambulatory Visit

## 2024-05-22 DIAGNOSIS — I639 Cerebral infarction, unspecified: Secondary | ICD-10-CM

## 2024-05-23 ENCOUNTER — Other Ambulatory Visit: Payer: Self-pay

## 2024-05-23 DIAGNOSIS — E119 Type 2 diabetes mellitus without complications: Secondary | ICD-10-CM

## 2024-05-23 LAB — CUP PACEART REMOTE DEVICE CHECK
Date Time Interrogation Session: 20250720234031
Implantable Pulse Generator Implant Date: 20240813

## 2024-05-23 MED ORDER — METFORMIN HCL 500 MG PO TABS: 500.0000 mg | ORAL_TABLET | Freq: Two times a day (BID) | ORAL | 0 refills | Status: DC

## 2024-05-25 ENCOUNTER — Ambulatory Visit: Payer: Self-pay | Admitting: Cardiology

## 2024-06-12 ENCOUNTER — Other Ambulatory Visit: Payer: Self-pay

## 2024-06-12 DIAGNOSIS — E119 Type 2 diabetes mellitus without complications: Secondary | ICD-10-CM

## 2024-06-12 MED ORDER — METOPROLOL SUCCINATE ER 25 MG PO TB24: 25.0000 mg | ORAL_TABLET | Freq: Every day | ORAL | 3 refills | Status: DC

## 2024-06-19 NOTE — Progress Notes (Signed)
 [facility] Summary Report / Loop Recorder

## 2024-06-21 ENCOUNTER — Ambulatory Visit: Admitting: Cardiology

## 2024-06-22 ENCOUNTER — Ambulatory Visit (INDEPENDENT_AMBULATORY_CARE_PROVIDER_SITE_OTHER)

## 2024-06-22 DIAGNOSIS — I639 Cerebral infarction, unspecified: Secondary | ICD-10-CM | POA: Diagnosis not present

## 2024-06-22 LAB — CUP PACEART REMOTE DEVICE CHECK
Date Time Interrogation Session: 20250820234907
Implantable Pulse Generator Implant Date: 20240813

## 2024-06-23 ENCOUNTER — Ambulatory Visit: Payer: Self-pay | Admitting: Cardiology

## 2024-06-26 ENCOUNTER — Ambulatory Visit: Payer: Medicare Other | Admitting: Family Medicine

## 2024-06-28 ENCOUNTER — Ambulatory Visit: Admitting: Nurse Practitioner

## 2024-06-28 ENCOUNTER — Encounter: Payer: Self-pay | Admitting: Nurse Practitioner

## 2024-06-28 VITALS — BP 130/78 | HR 70 | Temp 98.0°F | Ht 72.0 in | Wt 191.4 lb

## 2024-06-28 DIAGNOSIS — E785 Hyperlipidemia, unspecified: Secondary | ICD-10-CM | POA: Diagnosis not present

## 2024-06-28 DIAGNOSIS — I152 Hypertension secondary to endocrine disorders: Secondary | ICD-10-CM | POA: Diagnosis not present

## 2024-06-28 DIAGNOSIS — G40909 Epilepsy, unspecified, not intractable, without status epilepticus: Secondary | ICD-10-CM | POA: Diagnosis not present

## 2024-06-28 DIAGNOSIS — E118 Type 2 diabetes mellitus with unspecified complications: Secondary | ICD-10-CM | POA: Diagnosis not present

## 2024-06-28 DIAGNOSIS — E1159 Type 2 diabetes mellitus with other circulatory complications: Secondary | ICD-10-CM | POA: Diagnosis not present

## 2024-06-28 DIAGNOSIS — C61 Malignant neoplasm of prostate: Secondary | ICD-10-CM

## 2024-06-28 DIAGNOSIS — Z95818 Presence of other cardiac implants and grafts: Secondary | ICD-10-CM

## 2024-06-28 LAB — COMPREHENSIVE METABOLIC PANEL WITH GFR
ALT: 14 U/L (ref 0–53)
AST: 17 U/L (ref 0–37)
Albumin: 4.6 g/dL (ref 3.5–5.2)
Alkaline Phosphatase: 53 U/L (ref 39–117)
BUN: 19 mg/dL (ref 6–23)
CO2: 26 meq/L (ref 19–32)
Calcium: 9.2 mg/dL (ref 8.4–10.5)
Chloride: 104 meq/L (ref 96–112)
Creatinine, Ser: 0.84 mg/dL (ref 0.40–1.50)
GFR: 82.15 mL/min (ref >60.00)
Glucose, Bld: 116 mg/dL — ABNORMAL HIGH (ref 70–99)
Potassium: 4.2 meq/L (ref 3.5–5.1)
Sodium: 141 meq/L (ref 135–145)
Total Bilirubin: 0.5 mg/dL (ref 0.2–1.2)
Total Protein: 7.4 g/dL (ref 6.0–8.3)

## 2024-06-28 LAB — LIPID PANEL
Cholesterol: 114 mg/dL (ref 0–200)
HDL: 48 mg/dL (ref >39.00)
LDL Cholesterol: 35 mg/dL (ref 0–99)
NonHDL: 66.08
Total CHOL/HDL Ratio: 2
Triglycerides: 153 mg/dL — ABNORMAL HIGH (ref 0.0–149.0)
VLDL: 30.6 mg/dL (ref 0.0–40.0)

## 2024-06-28 LAB — HEMOGLOBIN A1C: Hgb A1c MFr Bld: 6.6 % — ABNORMAL HIGH (ref 4.6–6.5)

## 2024-06-28 NOTE — Progress Notes (Signed)
 Leron Glance, NP-C Phone: [phone]  Shannon Chung is a 83 y.o. male who presents today for transfer of care.   Discussed the use of AI scribe software for clinical note transcription with the patient, who gave verbal consent to proceed.  History of Present Illness   Shannon Chung is an 83 year old male with a history of prostate cancer who presents for transfer of care.  He has a history of prostate cancer with a PSA level that has remained steady at 6.9 for the past six months. He is scheduled for another PSA test in two months.  He has undergone multiple surgeries for squamous cell carcinoma moles, which he describes as recurring 'every now and again.'  He has a history of diabetes managed with metformin  500 mg twice daily. He does not monitor his blood sugar at home and reports no symptoms of excessive thirst or hypoglycemia.  He experienced a stroke in the past, followed by the placement of a stent in his carotid artery. Post-surgery, he had episodes of slurred speech and nonsensical speech, which were not confirmed as strokes by CT or MRI. These episodes were managed with Keppra , and he has had no further symptoms since starting the medication.  He has a loop recorder implanted for atrial fibrillation monitoring, which transmits data to [place]. He is on Plavix , which was prescribed due to a past colitis episode to thin his blood, though he experiences some bruising.  He takes lisinopril  and metoprolol  for blood pressure management, and Lipitor  and Zetia  for cholesterol control. Zetia  was added to enhance cholesterol management.  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  No chest pain, shortness of breath, dizziness, or swelling.      Social History   Tobacco Use  Smoking Status Former   Current packs/day: 0.00   Average packs/day: 0.5 packs/day for 10.0 years (5.0 ttl pk-yrs)    Types: Cigarettes   Start date: 11/02/1968   Quit date: 11/02/1978   Years since quitting: 45.7  Smokeless Tobacco Never  Tobacco Comments   former smoker 1967-1980 1 pk/week no FH lung cancer     Current Outpatient Medications on File Prior to Visit  Medication Sig Dispense Refill   acetaminophen  (TYLENOL ) 500 MG tablet Take 500 mg by mouth every 6 (six) hours as needed for mild pain.      aspirin  81 MG EC tablet Take 162 mg by mouth daily. Swallow whole.     atorvastatin  (LIPITOR ) 40 MG tablet TAKE 1 TABLET BY MOUTH DAILY 90 tablet 3   Blood Pressure KIT Check blood pressure two to three times a week 1 kit 0   Cholecalciferol  (VITAMIN D -3) 125 MCG (5000 UT) TABS Take 5,000 Units by mouth daily.     clopidogrel  (PLAVIX ) 75 MG tablet Take 1 tablet (75 mg total) by mouth daily. 30 tablet 0   ezetimibe  (ZETIA ) 10 MG tablet TAKE 1 TABLET BY MOUTH DAILY 90 tablet 3   famotidine  (PEPCID ) 20 MG tablet TAKE 1 TABLET BY MOUTH 2 TIMES DAILY AS NEEDED FOR HEARTBURN/INDIGESTION. D/C ZANTAC  180 tablet 3   fluticasone  (FLONASE ) 50 MCG/ACT nasal spray Place 1 spray into both nostrils daily as needed for allergies.     levETIRAcetam  (KEPPRA ) 500 MG tablet Take 1 tablet (500 mg total) by mouth 2 (two) times daily. 180 tablet 4   lisinopril  (ZESTRIL ) 10 MG tablet Take 1 tablet (10 mg total)  by mouth daily. 90 tablet 3   metFORMIN  (GLUCOPHAGE ) 500 MG tablet Take 1 tablet (500 mg total) by mouth 2 (two) times daily with a meal. 180 tablet 0   metoprolol  succinate (TOPROL -XL) 25 MG 24 hr tablet Take 1 tablet (25 mg total) by mouth daily. 90 tablet 3   prednisoLONE  acetate (PRED FORTE ) 1 % ophthalmic suspension Place 1 drop into both eyes daily.     sildenafil  (REVATIO ) 20 MG tablet TAKE 1 TABLET BY MOUTH DAILY AS NEEDED 30 tablet 0   vitamin B-12 (CYANOCOBALAMIN ) 500 MCG tablet Take 500 mcg by mouth daily.     No current facility-administered medications on file prior to visit.     ROS see history of  present illness  Objective  Physical Exam Vitals:   06/28/24 1304  BP: 130/78  Pulse: 70  Temp: 98 F (36.7 C)  SpO2: 96%    BP Readings from Last 3 Encounters:  06/28/24 130/78  03/21/24 136/70  03/08/24 (!) 100/58   Wt Readings from Last 3 Encounters:  06/28/24 191 lb 6.4 oz (86.8 kg)  03/21/24 200 lb (90.7 kg)  03/08/24 197 lb 6.4 oz (89.5 kg)    Physical Exam Constitutional:      General: He is not in acute distress.    Appearance: Normal appearance.  HENT:     Head: Normocephalic.  Cardiovascular:     Rate and Rhythm: Normal rate and regular rhythm.     Heart sounds: Normal heart sounds.  Pulmonary:     Effort: Pulmonary effort is normal.     Breath sounds: Normal breath sounds.  Skin:    General: Skin is warm and dry.  Neurological:     General: No focal deficit present.     Mental Status: He is alert.  Psychiatric:        Mood and Affect: Mood normal.        Behavior: Behavior normal.      Assessment/Plan: Please see individual problem list.  Hypertension associated with diabetes (HCC) Assessment & Plan: Hypertension is managed with lisinopril  and metoprolol . He reports no symptoms of chest pain, shortness of breath, dizziness, or swelling. Continue lisinopril  and metoprolol  as prescribed. Check CMP.  Orders: -     Comprehensive metabolic panel with GFR  Type 2 diabetes mellitus with complications (HCC) Assessment & Plan: Type 2 diabetes is managed with metformin  500 mg twice daily. He reports no symptoms of hyperglycemia or hypoglycemia. Continue metformin  500 mg twice daily, encourage home glucose monitoring, and monitor for symptoms of hyperglycemia or hypoglycemia. Check A1c.   Orders: -     Hemoglobin A1c  Dyslipidemia Assessment & Plan: Hyperlipidemia is managed with Lipitor  and Zetia  for enhanced lipid control. Continue Lipitor  and Zetia  as prescribed. Check lipid panel.   Orders: -     Lipid panel  Seizure disorder  [facility]) Assessment & Plan: Seizure disorder is controlled with Keppra , with no recent seizure activity. Continue Keppra  as prescribed and follow up with neurology as planned.   Prostate cancer [facility]) Assessment & Plan: PSA levels have remained stable at 6.9 for the past six months, below the intervention threshold of 10. Continue monitoring PSA and follow up with Urology as scheduled.    Status post placement of implantable loop recorder Assessment & Plan: Under continuous monitoring with a loop recorder, with no recent episodes reported. Continue monitoring and report any new symptoms immediately. Follow up with Cardiology as scheduled.       Return in  about 6 months (around 12/29/2024) for Follow up.   Leron Glance, NP-C [facility]

## 2024-06-29 ENCOUNTER — Ambulatory Visit: Payer: Self-pay | Admitting: Nurse Practitioner

## 2024-07-05 ENCOUNTER — Ambulatory Visit: Admitting: Cardiology

## 2024-07-12 DIAGNOSIS — Z95818 Presence of other cardiac implants and grafts: Secondary | ICD-10-CM | POA: Insufficient documentation

## 2024-07-12 NOTE — Assessment & Plan Note (Signed)
 Seizure disorder is controlled with Keppra , with no recent seizure activity. Continue Keppra  as prescribed and follow up with neurology as planned.

## 2024-07-12 NOTE — Assessment & Plan Note (Signed)
 PSA levels have remained stable at 6.9 for the past six months, below the intervention threshold of 10. Continue monitoring PSA and follow up with Urology as scheduled.

## 2024-07-12 NOTE — Assessment & Plan Note (Signed)
 Hypertension is managed with lisinopril  and metoprolol . He reports no symptoms of chest pain, shortness of breath, dizziness, or swelling. Continue lisinopril  and metoprolol  as prescribed. Check CMP.

## 2024-07-12 NOTE — Assessment & Plan Note (Signed)
 Hyperlipidemia is managed with Lipitor  and Zetia  for enhanced lipid control. Continue Lipitor  and Zetia  as prescribed. Check lipid panel.

## 2024-07-12 NOTE — Assessment & Plan Note (Signed)
 Type 2 diabetes is managed with metformin  500 mg twice daily. He reports no symptoms of hyperglycemia or hypoglycemia. Continue metformin  500 mg twice daily, encourage home glucose monitoring, and monitor for symptoms of hyperglycemia or hypoglycemia. Check A1c.

## 2024-07-12 NOTE — Assessment & Plan Note (Signed)
 Under continuous monitoring with a loop recorder, with no recent episodes reported. Continue monitoring and report any new symptoms immediately. Follow up with Cardiology as scheduled.

## 2024-07-24 ENCOUNTER — Ambulatory Visit (INDEPENDENT_AMBULATORY_CARE_PROVIDER_SITE_OTHER)

## 2024-07-24 DIAGNOSIS — I639 Cerebral infarction, unspecified: Secondary | ICD-10-CM

## 2024-07-24 LAB — CUP PACEART REMOTE DEVICE CHECK
Date Time Interrogation Session: 20250921233742
Implantable Pulse Generator Implant Date: 20240813

## 2024-07-25 NOTE — Progress Notes (Signed)
 Remote Loop Recorder Transmission

## 2024-07-26 ENCOUNTER — Ambulatory Visit: Payer: Self-pay | Admitting: Cardiology

## 2024-08-01 ENCOUNTER — Encounter: Payer: Self-pay | Admitting: Hospitalist

## 2024-08-01 ENCOUNTER — Other Ambulatory Visit: Payer: Self-pay

## 2024-08-01 ENCOUNTER — Emergency Department

## 2024-08-01 ENCOUNTER — Observation Stay

## 2024-08-01 ENCOUNTER — Inpatient Hospital Stay
Admission: EM | Admit: 2024-08-01 | Discharge: 2024-08-06 | DRG: 066 | Disposition: A | Discharge status: Home Health | Attending: Family Medicine | Admitting: Family Medicine

## 2024-08-01 DIAGNOSIS — I10 Essential (primary) hypertension: Secondary | ICD-10-CM | POA: Diagnosis present

## 2024-08-01 DIAGNOSIS — Z87891 Personal history of nicotine dependence: Secondary | ICD-10-CM

## 2024-08-01 DIAGNOSIS — R4701 Aphasia: Secondary | ICD-10-CM | POA: Diagnosis not present

## 2024-08-01 DIAGNOSIS — I4891 Unspecified atrial fibrillation: Secondary | ICD-10-CM | POA: Diagnosis present

## 2024-08-01 DIAGNOSIS — Z7984 Long term (current) use of oral hypoglycemic drugs: Secondary | ICD-10-CM

## 2024-08-01 DIAGNOSIS — R2990 Unspecified symptoms and signs involving the nervous system: Secondary | ICD-10-CM | POA: Diagnosis not present

## 2024-08-01 DIAGNOSIS — Z8673 Personal history of transient ischemic attack (TIA), and cerebral infarction without residual deficits: Secondary | ICD-10-CM

## 2024-08-01 DIAGNOSIS — H53413 Scotoma involving central area, bilateral: Secondary | ICD-10-CM

## 2024-08-01 DIAGNOSIS — Z7902 Long term (current) use of antithrombotics/antiplatelets: Secondary | ICD-10-CM

## 2024-08-01 DIAGNOSIS — I629 Nontraumatic intracranial hemorrhage, unspecified: Secondary | ICD-10-CM | POA: Diagnosis not present

## 2024-08-01 DIAGNOSIS — Z7189 Other specified counseling: Secondary | ICD-10-CM

## 2024-08-01 DIAGNOSIS — E1159 Type 2 diabetes mellitus with other circulatory complications: Secondary | ICD-10-CM

## 2024-08-01 DIAGNOSIS — M47812 Spondylosis without myelopathy or radiculopathy, cervical region: Secondary | ICD-10-CM

## 2024-08-01 DIAGNOSIS — M47816 Spondylosis without myelopathy or radiculopathy, lumbar region: Secondary | ICD-10-CM

## 2024-08-01 DIAGNOSIS — K760 Fatty (change of) liver, not elsewhere classified: Secondary | ICD-10-CM

## 2024-08-01 DIAGNOSIS — E785 Hyperlipidemia, unspecified: Secondary | ICD-10-CM | POA: Diagnosis present

## 2024-08-01 DIAGNOSIS — Z811 Family history of alcohol abuse and dependence: Secondary | ICD-10-CM

## 2024-08-01 DIAGNOSIS — Z808 Family history of malignant neoplasm of other organs or systems: Secondary | ICD-10-CM

## 2024-08-01 DIAGNOSIS — E118 Type 2 diabetes mellitus with unspecified complications: Secondary | ICD-10-CM

## 2024-08-01 DIAGNOSIS — Z0000 Encounter for general adult medical examination without abnormal findings: Secondary | ICD-10-CM

## 2024-08-01 DIAGNOSIS — Z87442 Personal history of urinary calculi: Secondary | ICD-10-CM

## 2024-08-01 DIAGNOSIS — I252 Old myocardial infarction: Secondary | ICD-10-CM

## 2024-08-01 DIAGNOSIS — K5790 Diverticulosis of intestine, part unspecified, without perforation or abscess without bleeding: Secondary | ICD-10-CM

## 2024-08-01 DIAGNOSIS — R569 Unspecified convulsions: Secondary | ICD-10-CM | POA: Diagnosis not present

## 2024-08-01 DIAGNOSIS — Z8521 Personal history of malignant neoplasm of larynx: Secondary | ICD-10-CM

## 2024-08-01 DIAGNOSIS — Z8616 Personal history of COVID-19: Secondary | ICD-10-CM

## 2024-08-01 DIAGNOSIS — I6523 Occlusion and stenosis of bilateral carotid arteries: Secondary | ICD-10-CM

## 2024-08-01 DIAGNOSIS — I25118 Atherosclerotic heart disease of native coronary artery with other forms of angina pectoris: Secondary | ICD-10-CM

## 2024-08-01 DIAGNOSIS — C61 Malignant neoplasm of prostate: Secondary | ICD-10-CM

## 2024-08-01 DIAGNOSIS — E119 Type 2 diabetes mellitus without complications: Secondary | ICD-10-CM | POA: Diagnosis present

## 2024-08-01 DIAGNOSIS — H35389 Toxic maculopathy, unspecified eye: Secondary | ICD-10-CM

## 2024-08-01 DIAGNOSIS — Z85828 Personal history of other malignant neoplasm of skin: Secondary | ICD-10-CM

## 2024-08-01 DIAGNOSIS — R29701 NIHSS score 1: Secondary | ICD-10-CM | POA: Diagnosis present

## 2024-08-01 DIAGNOSIS — Z96652 Presence of left artificial knee joint: Secondary | ICD-10-CM | POA: Diagnosis present

## 2024-08-01 DIAGNOSIS — H3554 Dystrophies primarily involving the retinal pigment epithelium: Secondary | ICD-10-CM

## 2024-08-01 DIAGNOSIS — I513 Intracardiac thrombosis, not elsewhere classified: Secondary | ICD-10-CM

## 2024-08-01 DIAGNOSIS — I639 Cerebral infarction, unspecified: Secondary | ICD-10-CM

## 2024-08-01 DIAGNOSIS — Z860100 Personal history of colon polyps, unspecified: Secondary | ICD-10-CM

## 2024-08-01 DIAGNOSIS — N400 Enlarged prostate without lower urinary tract symptoms: Secondary | ICD-10-CM

## 2024-08-01 DIAGNOSIS — Z751 Person awaiting admission to adequate facility elsewhere: Secondary | ICD-10-CM

## 2024-08-01 DIAGNOSIS — Z7982 Long term (current) use of aspirin: Secondary | ICD-10-CM

## 2024-08-01 DIAGNOSIS — Z951 Presence of aortocoronary bypass graft: Secondary | ICD-10-CM

## 2024-08-01 DIAGNOSIS — G8929 Other chronic pain: Secondary | ICD-10-CM

## 2024-08-01 DIAGNOSIS — G459 Transient cerebral ischemic attack, unspecified: Secondary | ICD-10-CM

## 2024-08-01 DIAGNOSIS — Z23 Encounter for immunization: Secondary | ICD-10-CM

## 2024-08-01 DIAGNOSIS — I2510 Atherosclerotic heart disease of native coronary artery without angina pectoris: Secondary | ICD-10-CM | POA: Diagnosis present

## 2024-08-01 DIAGNOSIS — G40909 Epilepsy, unspecified, not intractable, without status epilepticus: Secondary | ICD-10-CM

## 2024-08-01 DIAGNOSIS — H31013 Macula scars of posterior pole (postinflammatory) (post-traumatic), bilateral: Secondary | ICD-10-CM

## 2024-08-01 DIAGNOSIS — Z8546 Personal history of malignant neoplasm of prostate: Secondary | ICD-10-CM

## 2024-08-01 DIAGNOSIS — Z833 Family history of diabetes mellitus: Secondary | ICD-10-CM

## 2024-08-01 DIAGNOSIS — Z6372 Alcoholism and drug addiction in family: Secondary | ICD-10-CM

## 2024-08-01 DIAGNOSIS — E559 Vitamin D deficiency, unspecified: Secondary | ICD-10-CM

## 2024-08-01 DIAGNOSIS — Z923 Personal history of irradiation: Secondary | ICD-10-CM

## 2024-08-01 DIAGNOSIS — Z95818 Presence of other cardiac implants and grafts: Secondary | ICD-10-CM

## 2024-08-01 DIAGNOSIS — I700 Atherosclerosis of aorta: Secondary | ICD-10-CM

## 2024-08-01 DIAGNOSIS — C4492 Squamous cell carcinoma of skin, unspecified: Secondary | ICD-10-CM

## 2024-08-01 DIAGNOSIS — E010 Iodine-deficiency related diffuse (endemic) goiter: Secondary | ICD-10-CM

## 2024-08-01 DIAGNOSIS — Z8249 Family history of ischemic heart disease and other diseases of the circulatory system: Secondary | ICD-10-CM

## 2024-08-01 DIAGNOSIS — Z79899 Other long term (current) drug therapy: Secondary | ICD-10-CM

## 2024-08-01 DIAGNOSIS — K219 Gastro-esophageal reflux disease without esophagitis: Secondary | ICD-10-CM

## 2024-08-01 DIAGNOSIS — E663 Overweight: Secondary | ICD-10-CM

## 2024-08-01 DIAGNOSIS — I255 Ischemic cardiomyopathy: Secondary | ICD-10-CM | POA: Diagnosis present

## 2024-08-01 DIAGNOSIS — Z83438 Family history of other disorder of lipoprotein metabolism and other lipidemia: Secondary | ICD-10-CM

## 2024-08-01 DIAGNOSIS — I63239 Cerebral infarction due to unspecified occlusion or stenosis of unspecified carotid arteries: Secondary | ICD-10-CM

## 2024-08-01 LAB — DIFFERENTIAL
Abs Immature Granulocytes: 0.02 K/uL (ref 0.00–0.07)
Basophils Absolute: 0 K/uL (ref 0.0–0.1)
Basophils Relative: 0 %
Eosinophils Absolute: 0.1 K/uL (ref 0.0–0.5)
Eosinophils Relative: 1 %
Immature Granulocytes: 0 %
Lymphocytes Relative: 30 %
Lymphs Abs: 2.3 K/uL (ref 0.7–4.0)
Monocytes Absolute: 0.4 K/uL (ref 0.1–1.0)
Monocytes Relative: 5 %
Neutro Abs: 5 K/uL (ref 1.7–7.7)
Neutrophils Relative %: 64 %

## 2024-08-01 LAB — COMPREHENSIVE METABOLIC PANEL WITH GFR
ALT: 14 U/L (ref 0–44)
AST: 21 U/L (ref 15–41)
Albumin: 4.3 g/dL (ref 3.5–5.0)
Alkaline Phosphatase: 49 U/L (ref 38–126)
Anion gap: 12 (ref 5–15)
BUN: 18 mg/dL (ref 8–23)
CO2: 25 mmol/L (ref 22–32)
Calcium: 9.3 mg/dL (ref 8.9–10.3)
Chloride: 103 mmol/L (ref 98–111)
Creatinine, Ser: 1.08 mg/dL (ref 0.61–1.24)
GFR, Estimated: >60 mL/min (ref >60)
Glucose, Bld: 121 mg/dL — ABNORMAL HIGH (ref 70–99)
Potassium: 4.1 mmol/L (ref 3.5–5.1)
Sodium: 140 mmol/L (ref 135–145)
Total Bilirubin: 0.9 mg/dL (ref 0.0–1.2)
Total Protein: 7.5 g/dL (ref 6.5–8.1)

## 2024-08-01 LAB — URINALYSIS, COMPLETE (UACMP) WITH MICROSCOPIC
Bacteria, UA: NONE SEEN
Bilirubin Urine: NEGATIVE
Glucose, UA: NEGATIVE mg/dL
Hgb urine dipstick: NEGATIVE
Ketones, ur: NEGATIVE mg/dL
Leukocytes,Ua: NEGATIVE
Nitrite: NEGATIVE
Protein, ur: NEGATIVE mg/dL
Specific Gravity, Urine: 1.035 — ABNORMAL HIGH (ref 1.005–1.030)
Squamous Epithelial / HPF: 0 /HPF (ref 0–5)
WBC, UA: 0 WBC/hpf (ref 0–5)
pH: 5 (ref 5.0–8.0)

## 2024-08-01 LAB — CBC
HCT: 42.3 % (ref 39.0–52.0)
Hemoglobin: 14.2 g/dL (ref 13.0–17.0)
MCH: 32.6 pg (ref 26.0–34.0)
MCHC: 33.6 g/dL (ref 30.0–36.0)
MCV: 97.2 fL (ref 80.0–100.0)
Platelets: 178 K/uL (ref 150–400)
RBC: 4.35 MIL/uL (ref 4.22–5.81)
RDW: 13.4 % (ref 11.5–15.5)
WBC: 7.8 K/uL (ref 4.0–10.5)
nRBC: 0 % (ref 0.0–0.2)

## 2024-08-01 LAB — CBG MONITORING, ED: Glucose-Capillary: 114 mg/dL — ABNORMAL HIGH (ref 70–99)

## 2024-08-01 LAB — APTT: aPTT: 28 s (ref 24–36)

## 2024-08-01 LAB — ETHANOL: Alcohol, Ethyl (B): <15 mg/dL (ref <15)

## 2024-08-01 LAB — PROTIME-INR
INR: 1 (ref 0.8–1.2)
Prothrombin Time: 13.9 s (ref 11.4–15.2)

## 2024-08-01 MED ORDER — LEVETIRACETAM 750 MG PO TABS
750.0000 mg | ORAL_TABLET | Freq: Two times a day (BID) | ORAL | Status: DC
Start: 2024-08-01 — End: 2024-08-02
  Administered 2024-08-01 – 2024-08-02 (×2): 750 mg via ORAL
  Filled 2024-08-01 (×3): qty 1

## 2024-08-01 MED ORDER — LEVETIRACETAM (KEPPRA) 500 MG/5 ML ADULT IV PUSH
1000.0000 mg | Freq: Once | INTRAVENOUS | Status: AC
  Administered 2024-08-01: 1000 mg via INTRAVENOUS
  Filled 2024-08-01: qty 10

## 2024-08-01 MED ORDER — STROKE: EARLY STAGES OF RECOVERY BOOK: Freq: Once | Status: AC

## 2024-08-01 MED ORDER — ASPIRIN 81 MG PO TBEC
162.0000 mg | DELAYED_RELEASE_TABLET | Freq: Every day | ORAL | Status: DC
  Administered 2024-08-01 – 2024-08-06 (×6): 162 mg via ORAL
  Filled 2024-08-01 (×6): qty 2

## 2024-08-01 MED ORDER — EZETIMIBE 10 MG PO TABS
10.0000 mg | ORAL_TABLET | Freq: Every day | ORAL | Status: DC
  Administered 2024-08-01 – 2024-08-06 (×6): 10 mg via ORAL
  Filled 2024-08-01 (×6): qty 1

## 2024-08-01 MED ORDER — ATORVASTATIN CALCIUM 20 MG PO TABS
40.0000 mg | ORAL_TABLET | Freq: Every day | ORAL | Status: DC
  Administered 2024-08-01 – 2024-08-06 (×6): 40 mg via ORAL
  Filled 2024-08-01 (×6): qty 2

## 2024-08-01 MED ORDER — IOHEXOL 350 MG/ML SOLN
100.0000 mL | Freq: Once | INTRAVENOUS | Status: AC | PRN
  Administered 2024-08-01: 100 mL via INTRAVENOUS

## 2024-08-01 MED ORDER — ENOXAPARIN SODIUM 40 MG/0.4ML IJ SOSY
40.0000 mg | PREFILLED_SYRINGE | INTRAMUSCULAR | Status: DC
  Administered 2024-08-01 – 2024-08-05 (×5): 40 mg via SUBCUTANEOUS
  Filled 2024-08-01 (×5): qty 0.4

## 2024-08-01 MED ORDER — LEVETIRACETAM 500 MG PO TABS
500.0000 mg | ORAL_TABLET | Freq: Two times a day (BID) | ORAL | Status: DC
Start: 2024-08-01 — End: 2024-08-01

## 2024-08-01 MED ORDER — CLOPIDOGREL BISULFATE 75 MG PO TABS
75.0000 mg | ORAL_TABLET | Freq: Every day | ORAL | Status: DC
  Administered 2024-08-01 – 2024-08-06 (×6): 75 mg via ORAL
  Filled 2024-08-01 (×6): qty 1

## 2024-08-01 NOTE — ED Notes (Signed)
Informed rn bed assigned 

## 2024-08-01 NOTE — H&P (Addendum)
 History and Physical    Shannon Chung FMW:[id number] DOB: [birth year] DOA: 08/01/2024  PCP: Gretel App, NP  Patient coming from: home  I have personally briefly reviewed patient's old medical records in [facility]  Chief Complaint: word-finding difficulty  HPI: Shannon Chung is a 83 y.o. 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   Pt started having difficulty speaking around 2 pm.  Last known well was 11 AM noted by wife who then left the house.  When wife returned, she found pt having speech difficulty.  While getting dressed to go to the hospital, wife also noted more balance issues with pt.  Pt did not remember if he took his home Keppra  this morning.  Prior to this, pt was at his baseline health and no other complaints.    Pt had recurrent presentations with word-finding difficulty in June 2024 after his left carotid stenting on 04/26/23.  EEG showed some cortical dysfunction left temporal region and therefore patient was started empirically on levetiracetam  500 mg twice a day.   ED Course: initial vitals: afebrile, pulse 67, BP 154/90, RR 18, sating 100% on room air.  Labs unremarkable.  Code stroke activated.  Stat CT head with no acute findings.  CT angiography head and neck with no ELVO.  CT perfusion study negative for perfusion deficit.  Neuro consulted, and rec admission for further workup.   Assessment/Plan  Word-finding difficulty  --workup for underlying nonconvulsive seizures vs stroke.  Recommendations per neuro: --MRI brain without contrast-stroke risk factor workup only if MRI is positive for stroke. --Routine EEG --Additional Keppra  1 g IV x 1 now --Increase Keppra  dose to 750 mg twice  daily --Seizure precautions --Check UA and chest x-ray --Continue home dual antiplatelets and statin for stroke prevention --hold home Lisinopril  and Toprol  --PT/OT  DM2 --last A1c 6.6 --resume metformin  after discharge.  HTN --hold home Lisinopril  and Toprol  for now  Hx of CAD s/p CABGX5 --cont home ASA, plavix , lipitor  and Zetia    DVT prophylaxis: Lovenox  SQ Code Status: Full code  Family Communication: wife updated at bedside on admission  Disposition Plan: home  Consults called: Neuro Level of care: Med-Surg   Review of Systems: As per HPI otherwise complete review of systems negative.   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 06/2015 Cardiac CT: Ca score 1103 (84th %'ile);  b. 07/2015 Cath: LM 70, LAD 80p, 100/57m, D1 70, D2 95, RI 75, RCA 100p/m;  c. 07/2015 CABG x 5 (LIMA->LAD, VG->Diag, VG->OM1->OM2, VG->OM3).   COVID-19    12/2021   COVID-19 01/18/2022   COVID-19 vaccine administered 01/18/2022   Unknown how many vaccine doses have been received. Entered from Emergency Triage Note.   Diabetes mellitus without complication (HCC) 07/2015   Dyslipidemia    Essential hypertension  Essential hypertension 01/02/2015   Formatting of this note might be different from the original.  Last Assessment & Plan:   Chronic, stable. Continue current regimen.   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 dilated, EF 35% improved to 45-50% (2015);  b. 07/2015 EF 25-35% by LV gram.   Ischemic cardiomyopathy 01/02/2015   Kidney stones 04/17/2021   Lone atrial fibrillation (HCC) 1983   a. isolated episode, not on OAC.   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 06/2014: LV; resolved with coumadin-->no residual on f/u echo, no longer on coumadin.   Mural thrombus of heart 08/26/2021   Formatting of this note might be different from the original. Jun 16, 2019 Entered By: VINIE ALLEAN AQUAS Comment: left ventricle, cardiac apex   Osteoarthritis    a. 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 Jun 16, 2019 Entered By: VINIE ALLEAN AQUAS Comment: leftAug 19, 2020 Entered By: VINIE ALLEAN AQUAS Comment: resolved by total left knee replacement Jun 16, 2019 Entered By: VINIE ALLEAN AQUAS Comment: leftAug 19, 2020 Entered By: VINIE ALLEAN AQUAS Comment: resolved by total left knee replacement   Trigger finger of left hand 07/07/2019   Vitamin D  deficiency     Past Surgical History:  Procedure Laterality Date   BICEPS TENDON REPAIR Right 1993   CARDIAC CATHETERIZATION N/A 07/05/2015   Procedure: Left Heart Cath and Coronary Angiography;  Surgeon: Evalene JINNY Lunger, MD;  Location: [facility] INVASIVE CV LAB;  Service: Cardiovascular;  Laterality: N/A;   CAROTID PTA/STENT INTERVENTION Left 04/26/2023   Procedure: CAROTID PTA/STENT INTERVENTION;  Surgeon: Marea Selinda RAMAN, MD;  Location: [facility] INVASIVE CV LAB;  Service: Cardiovascular;  Laterality: Left;   CATARACT EXTRACTION Left 12/2016   with keratoplasty   COLONOSCOPY  2007   COLONOSCOPY WITH PROPOFOL  N/A 12/02/2017   TA, SSA, rpt 3 yrs(Tahiliani, Varnita B, MD)   COLONOSCOPY WITH PROPOFOL  N/A 11/26/2020   Procedure: COLONOSCOPY WITH PROPOFOL ;  Surgeon: Jinny Carmine, MD;  Location: [facility] ENDOSCOPY;  Service: Endoscopy;  Laterality: N/A;   CORONARY ARTERY BYPASS GRAFT N/A 07/29/2015   Procedure: CORONARY ARTERY BYPASS GRAFTING (CABG) x 5 (LIMA to LAD, SVG to DIAGONAL,  SVG SEQUENTIALLY to OM1 and OM2, SVG to OM3) with Endoscopic Vein Havesting of  GREATER SAPHENOUS VEIN from RIGHT THIGH and partial LOWER LEG ;  Surgeon: Dorise MARLA Fellers, MD;  Location: [facility] OR;  Service: Open Heart Surgery;  Laterality: N/A;   EYE SURGERY     b/l cataract and cornea replaced    HAND SURGERY     left hand 1st/2nd trigger fingers Dr. Cleotilde ortho    JOINT REPLACEMENT     KNEE ARTHROSCOPY Left remote   MOHS SURGERY     left cheek scc 2022 Dr. Lloyd   MOHS  SURGERY     x 5 facial scc   right biceps tendon     repair/re attachment    SKIN CANCER EXCISION  10/2015   BCC - L ala (pending MOHs) and L scapula (complete excision)   TEE WITHOUT CARDIOVERSION N/A 07/29/2015   Procedure: TRANSESOPHAGEAL ECHOCARDIOGRAM (TEE);  Surgeon: Dorise MARLA Fellers, MD;  Location: [facility] OR;  Service: Open Heart Surgery;  Laterality: N/A;   TONSILLECTOMY  1949   TOTAL KNEE ARTHROPLASTY Left 03/18/2016   cemented L TKR; Kayla Cleotilde, MD     reports that he quit smoking about 45 years ago. His smoking use included cigarettes. He started smoking about 55 years ago. He has a 5 pack-year smoking history. He has never used smokeless tobacco. He reports that he does not currently use alcohol after a past usage of about 4.0 standard drinks of alcohol per week. He reports that he does not use drugs.  Allergies  Allergen Reactions   Pollen Extract Itching    Family History  Problem Relation Age of Onset   CAD Father 78       MI   Hypertension Father    Hyperlipidemia Father    Alcoholism Father    Diabetes Father    Alcohol abuse Father    Heart disease Father    Cancer Daughter        dx'ed 73 retroperitoneal liposarcoma     Prior to Admission medications   Medication Sig Start Date End Date Taking? Authorizing Provider  acetaminophen  (TYLENOL ) 500 MG tablet Take 500 mg by mouth every 6 (six) hours as needed for mild pain.     [provider]  aspirin  81 MG EC tablet Take 162 mg by mouth daily. Swallow whole.    [provider]  atorvastatin  (LIPITOR ) 40 MG tablet TAKE 1 TABLET BY MOUTH DAILY 03/14/24   Gollan, Timothy J, MD  Blood Pressure KIT Check blood pressure two to three times a week 05/05/23   Hope Merle, MD  Cholecalciferol  (VITAMIN D -3) 125 MCG (5000 UT) TABS Take 5,000 Units by mouth daily.    [provider]  clopidogrel  (PLAVIX ) 75 MG tablet Take 1 tablet (75 mg total) by mouth daily. 02/23/23   Alexander, Natalie, DO  ezetimibe   (ZETIA ) 10 MG tablet TAKE 1 TABLET BY MOUTH DAILY 03/14/24   Furth, Cadence H, PA-C  famotidine  (PEPCID ) 20 MG tablet TAKE 1 TABLET BY MOUTH 2 TIMES DAILY AS NEEDED FOR HEARTBURN/INDIGESTION.  D/C ZANTAC  09/15/21   [name], Randine SAILOR, MD  fluticasone  (FLONASE ) 50 MCG/ACT nasal spray Place 1 spray into both nostrils daily as needed for allergies.    [provider]  levETIRAcetam  (KEPPRA ) 500 MG tablet Take 1 tablet (500 mg total) by mouth 2 (two) times daily. 12/20/23 03/14/25  Penumalli, Eduard SAUNDERS, MD  lisinopril  (ZESTRIL ) 10 MG tablet Take 1 tablet (10 mg total) by mouth daily. 09/28/23   Hope Merle, MD  metFORMIN  (GLUCOPHAGE ) 500 MG tablet Take 1 tablet (500 mg total) by mouth 2 (two) times daily with a meal. 05/23/24   Marylynn Verneita CROME, MD  metoprolol  succinate (TOPROL -XL) 25 MG 24 hr tablet Take 1 tablet (25 mg total) by mouth daily. 06/12/24   Gretel App, NP  prednisoLONE  acetate (PRED FORTE ) 1 % ophthalmic suspension Place 1 drop into both eyes daily.    [provider]  sildenafil  (REVATIO ) 20 MG tablet TAKE 1 TABLET BY MOUTH DAILY AS NEEDED 04/12/24   Hope Merle, MD  vitamin B-12 (CYANOCOBALAMIN ) 500 MCG tablet Take 500 mcg by mouth daily.    [provider]    Physical Exam: Vitals:   08/01/24 1555 08/01/24 1700  BP: (!) 154/90 105/75  Pulse: 67 65  Resp: 18 15  Temp: 98.1 F (36.7 C)   TempSrc: Oral   SpO2: 100% 98%    Constitutional: NAD, AAOx3, with word-finding difficulties  HEENT: conjunctivae and lids normal, EOMI CV: No cyanosis.   RESP: normal respiratory effort, on RA Psych: Normal mood and affect.  Appropriate judgement and reason  Labs on Admission: I have personally reviewed labs and imaging studies  Time spent: 75 minutes  Ellouise Haber MD [facility]  If 7PM-7AM, please contact night-coverage 08/01/2024, 5:53 PM

## 2024-08-01 NOTE — Progress Notes (Signed)
 CODE STROKE- PHARMACY COMMUNICATION   Time CODE STROKE called/page received: 1514  Time response to CODE STROKE was made (in person or via phone): in person  Time Stroke Kit retrieved from Pyxis (only if needed): TNK not given - outside window  Name of Provider/Nurse contacted: Dr. 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 06/2015 Cardiac CT: Ca score 1103 (84th %'ile);  b. 07/2015 Cath: LM 70, LAD 80p, 100/26m, D1 70, D2 95, RI 75, RCA 100p/m;  c. 07/2015 CABG x 5 (LIMA->LAD, VG->Diag, VG->OM1->OM2, VG->OM3).   COVID-19    12/2021   COVID-19 01/18/2022   COVID-19 vaccine administered 01/18/2022   Unknown how many vaccine doses have been received. Entered from Emergency Triage Note.   Diabetes mellitus without complication (HCC) 07/2015   Dyslipidemia    Essential hypertension    Essential hypertension 01/02/2015   Formatting of this note might be different from the original.  Last Assessment & Plan:   Chronic, stable. Continue current regimen.   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 dilated, EF 35% improved to 45-50% (2015);  b. 07/2015 EF 25-35% by LV gram.   Ischemic cardiomyopathy 01/02/2015   Kidney stones 04/17/2021   Lone atrial fibrillation (HCC) 1983   a. isolated episode, not on OAC.   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 06/2014: LV; resolved with coumadin-->no residual on f/u echo, no longer on coumadin.   Mural thrombus of heart 08/26/2021   Formatting of this note might be different from the original. Jun 16, 2019 Entered By: VINIE ALLEAN AQUAS Comment: left ventricle, cardiac apex   Osteoarthritis    a. R-shoulder, L-knee Ted ortho)   Personal history of colonic polyps    Polyp of colon    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   PSA elevation 03/25/2018   Retention cyst of paranasal sinus 03/11/2022   Shoulder pain 12/23/2022   Jun 21, 2019 Entered By: VINIE ALLEAN AQUAS Comment: attributed to arthritis   Skin cancer    squamous and basal cell right forearm,  SCC left cheek 10/04/20 sees derm regularly Dr. 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 Jun 16, 2019 Entered By: VINIE ALLEAN AQUAS Comment: leftAug 19, 2020 Entered By: VINIE ALLEAN AQUAS Comment: resolved by total left knee replacement Jun 16, 2019 Entered By: VINIE ALLEAN AQUAS Comment: leftAug  19, 2020 Entered By: VINIE ALLEAN AQUAS Comment: resolved by total left knee replacement   Trigger finger of left hand 07/07/2019   Vitamin D  deficiency    Prior to Admission medications   Medication Sig Start Date End Date Taking? Authorizing Provider  acetaminophen  (TYLENOL ) 500 MG tablet Take 500 mg by mouth every 6 (six) hours as needed for mild pain.     [provider]  aspirin  81 MG EC tablet Take 162 mg by mouth daily. Swallow whole.    [provider]  atorvastatin  (LIPITOR ) 40 MG tablet TAKE 1 TABLET BY MOUTH DAILY 03/14/24   Gollan, Timothy J, MD  Blood Pressure KIT Check blood pressure two to three times a week 05/05/23   Hope Merle, MD  Cholecalciferol  (VITAMIN D -3) 125 MCG (5000 UT) TABS Take 5,000 Units by mouth daily.    [provider]  clopidogrel  (PLAVIX ) 75 MG tablet Take 1 tablet (75 mg total) by mouth daily. 02/23/23   Alexander, Natalie, DO  ezetimibe  (ZETIA ) 10 MG tablet TAKE 1 TABLET BY MOUTH DAILY 03/14/24   Furth, Cadence H, PA-C  famotidine  (PEPCID ) 20 MG tablet TAKE 1 TABLET BY MOUTH 2 TIMES DAILY AS NEEDED FOR HEARTBURN/INDIGESTION. D/C ZANTAC  09/15/21   [name], Randine SAILOR, MD  fluticasone  (FLONASE ) 50 MCG/ACT nasal spray Place 1 spray into both nostrils daily as needed for allergies.    [provider]  levETIRAcetam  (KEPPRA ) 500 MG tablet Take 1 tablet (500 mg total) by mouth 2 (two) times daily. 12/20/23 03/14/25  Penumalli, Vikram R, MD  lisinopril  (ZESTRIL ) 10 MG tablet Take 1 tablet (10 mg total) by mouth daily. 09/28/23   Hope Merle, MD  metFORMIN  (GLUCOPHAGE ) 500 MG tablet Take 1 tablet (500 mg total) by mouth 2 (two) times daily with a meal. 05/23/24   Marylynn Verneita CROME, MD  metoprolol  succinate (TOPROL -XL) 25 MG 24 hr tablet Take 1 tablet (25 mg total) by mouth daily. 06/12/24   Gretel App, NP  prednisoLONE  acetate (PRED FORTE ) 1 % ophthalmic suspension Place 1 drop into both eyes daily.    [provider]   sildenafil  (REVATIO ) 20 MG tablet TAKE 1 TABLET BY MOUTH DAILY AS NEEDED 04/12/24   Hope Merle, MD  vitamin B-12 (CYANOCOBALAMIN ) 500 MCG tablet Take 500 mcg by mouth daily.    [provider]    Damien Napoleon ,PharmD Clinical Pharmacist  08/01/2024  3:51 PM

## 2024-08-01 NOTE — ED Notes (Signed)
 LKN 1100 Telestroke notified 1518 Dr. Voncile awaiting patient 1519 Patient arrival 1525 Cleared for CT 1525 Patient arrived to CT, phlebotomist in CT 1528 No TNK per Dr. Voncile 1545 Patient back to ED 1550

## 2024-08-01 NOTE — ED Triage Notes (Signed)
 Patient to ED via [facility], per wife patient was LKN at 1100 today but is currently having slurred speech. Dr. Arora at bedside and states patient has history of the same. Code stroke was called by EMS PTA due to LVO 1.  BP 188/90 HR 80 99% RA CBG 119

## 2024-08-01 NOTE — Progress Notes (Signed)
 CCMD called this Clinical research associate and said that the patient had a run of [place]. Dr. Delayne Solian was notified. Dr. Solian asked that CCMD put the strip in epic and she ordered an EKG. CCMD notified and strip was put in epic.

## 2024-08-01 NOTE — Consult Note (Signed)
 NEUROLOGY CONSULT NOTE   Date of service: August 01, 2024 Patient Name: Shannon Chung MRN:  [id number] DOB:  December 19, 1940 Chief Complaint: speech issues Requesting Provider: Jacolyn Pae, MD  History of Present Illness  Shannon Chung is a 83 y.o. male with past medical history of stereotypic spells of speech disturbance and EEG with left hemispheric cortical dysfunction, who at 1 point has also been treated with TNK for possible stroke with MRI eventually being negative-presents for speech related disturbances again. Last known well was 11 AM noted by wife.  After the last known well time, started having trouble with word forming and long sentences as he has had before.  EMS was called-evaluated him and activated a code stroke. I have seen Shannon Chung before, and his exam was very similar to what I had seen in 2024 after his left carotid stent with word-finding difficulty. Stat CT head with no acute findings CT angiography head and neck with no ELVO CT perfusion study negative for perfusion deficit  LKW: 11 AM Modified rankin score: 0-Completely asymptomatic and back to baseline post- stroke IV Thrombolysis: Stereotypic spells of speech disturbance-most likely consistent with electrographic abnormality rather than a stroke, NIHSS mild to treat and also at the time imaging completed, outside window EVT: No-no ELVO  NIHSS components Score: Comment  1a Level of Conscious 0[x]  1[]  2[]  3[]      1b LOC Questions 0[x]  1[]  2[]       1c LOC Commands 0[x]  1[]  2[]       2 Best Gaze 0[x]  1[]  2[]       3 Visual 0[x]  1[]  2[]  3[]      4 Facial Palsy 0[x]  1[]  2[]  3[]      5a Motor Arm - left 0[x]  1[]  2[]  3[]  4[]  UN[]    5b Motor Arm - Right 0[x]  1[]  2[]  3[]  4[]  UN[]    6a Motor Leg - Left 0[x]  1[]  2[]  3[]  4[]  UN[]    6b Motor Leg - Right 0[x]  1[]  2[]  3[]  4[]  UN[]    7 Limb Ataxia 0[x]  1[]  2[]  UN[]      8 Sensory 0[x]  1[]  2[]  UN[]      9 Best Language 0[]  1[x]  2[]  3[]      10  Dysarthria 0[x]  1[]  2[]  UN[]      11 Extinct. 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 06/2015 Cardiac CT: Ca score 1103 (84th %'ile);  b. 07/2015 Cath: LM 70, LAD 80p, 100/2m, D1 70, D2 95, RI 75, RCA 100p/m;  c. 07/2015 CABG x 5 (LIMA->LAD, VG->Diag, VG->OM1->OM2, VG->OM3).   COVID-19    12/2021   COVID-19 01/18/2022   COVID-19 vaccine administered 01/18/2022   Unknown how many vaccine doses have been received. Entered from Emergency Triage Note.   Diabetes mellitus without complication (HCC) 07/2015   Dyslipidemia    Essential hypertension  Essential hypertension 01/02/2015   Formatting of this note might be different from the original.  Last Assessment & Plan:   Chronic, stable. Continue current regimen.   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 dilated, EF 35% improved to 45-50% (2015);  b. 07/2015 EF 25-35% by LV gram.   Ischemic cardiomyopathy 01/02/2015   Kidney stones 04/17/2021   Lone atrial fibrillation (HCC) 1983   a. isolated episode, not on OAC.   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 06/2014: LV; resolved with coumadin-->no residual on f/u echo, no longer on coumadin.   Mural thrombus of heart 08/26/2021   Formatting of this note might be different from the original. Jun 16, 2019 Entered By: VINIE ALLEAN AQUAS Comment: left ventricle, cardiac apex   Osteoarthritis    a. 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 Jun 16, 2019 Entered By: VINIE ALLEAN AQUAS Comment: leftAug 19, 2020 Entered By: VINIE ALLEAN AQUAS Comment: resolved by total left knee replacement Jun 16, 2019 Entered By: VINIE ALLEAN AQUAS Comment: leftAug 19, 2020 Entered By: VINIE ALLEAN AQUAS Comment: resolved by total left knee replacement    Trigger finger of left hand 07/07/2019   Vitamin D  deficiency     Past Surgical History:  Procedure Laterality Date   BICEPS TENDON REPAIR Right 1993   CARDIAC CATHETERIZATION N/A 07/05/2015   Procedure: Left Heart Cath and Coronary Angiography;  Surgeon: Evalene JINNY Lunger, MD;  Location: [facility] INVASIVE CV LAB;  Service: Cardiovascular;  Laterality: N/A;   CAROTID PTA/STENT INTERVENTION Left 04/26/2023   Procedure: CAROTID PTA/STENT INTERVENTION;  Surgeon: Marea Selinda RAMAN, MD;  Location: [facility] INVASIVE CV LAB;  Service: Cardiovascular;  Laterality: Left;   CATARACT EXTRACTION Left 12/2016   with keratoplasty   COLONOSCOPY  2007   COLONOSCOPY WITH PROPOFOL  N/A 12/02/2017   TA, SSA, rpt 3 yrs(Tahiliani, Varnita B, MD)   COLONOSCOPY WITH PROPOFOL  N/A 11/26/2020   Procedure: COLONOSCOPY WITH PROPOFOL ;  Surgeon: Jinny Carmine, MD;  Location: [facility] ENDOSCOPY;  Service: Endoscopy;  Laterality: N/A;   CORONARY ARTERY BYPASS GRAFT N/A 07/29/2015   Procedure: CORONARY ARTERY BYPASS GRAFTING (CABG) x 5 (LIMA to LAD, SVG to DIAGONAL,  SVG SEQUENTIALLY to OM1 and OM2, SVG to OM3) with Endoscopic Vein Havesting of  GREATER SAPHENOUS VEIN from RIGHT THIGH and partial LOWER LEG ;  Surgeon: Dorise MARLA Fellers, MD;  Location: [facility] OR;  Service: Open Heart Surgery;  Laterality: N/A;   EYE SURGERY     b/l cataract and cornea replaced    HAND SURGERY     left hand 1st/2nd trigger fingers Dr. Cleotilde ortho    JOINT REPLACEMENT     KNEE ARTHROSCOPY Left remote   MOHS SURGERY     left cheek scc 2022 Dr. Lloyd   MOHS SURGERY     x 5 facial scc   right biceps tendon     repair/re attachment    SKIN CANCER EXCISION  10/2015   BCC - L ala (pending MOHs) and L scapula (complete excision)   TEE WITHOUT CARDIOVERSION N/A 07/29/2015   Procedure: TRANSESOPHAGEAL ECHOCARDIOGRAM (TEE);  Surgeon: Dorise MARLA Fellers, MD;  Location: [facility] OR;  Service: Open Heart Surgery;  Laterality: N/A;   TONSILLECTOMY  1949   TOTAL KNEE ARTHROPLASTY Left  03/18/2016   cemented L TKR; Kayla Cleotilde, MD    Family History: Family History  Problem Relation Age of Onset   CAD Father 68       MI   Hypertension Father    Hyperlipidemia Father    Alcoholism Father    Diabetes Father    Alcohol abuse Father    Heart disease Father    Cancer Daughter        dx'ed 68 retroperitoneal liposarcoma     Social History  reports that he quit smoking about 45 years ago. His smoking use included cigarettes. He started smoking about 55 years ago. He has a 5 pack-year smoking history. He has never used smokeless tobacco. He reports that he does not currently use alcohol after a past usage of about 4.0 standard drinks of alcohol per week. He reports that he does not use drugs.  Allergies  Allergen Reactions   Pollen Extract Itching    Medications   Current Facility-Administered Medications:    levETIRAcetam  (KEPPRA ) undiluted injection 1,000 mg, 1,000 mg, Intravenous, Once, Jacolyn Pae, MD  Current Outpatient Medications:    acetaminophen  (TYLENOL ) 500 MG tablet, Take 500 mg by mouth every 6 (six) hours as needed for mild pain. , Disp: , Rfl:    aspirin  81 MG EC tablet, Take 162 mg by mouth daily. Swallow whole., Disp: , Rfl:    atorvastatin  (LIPITOR ) 40 MG tablet, TAKE 1 TABLET BY MOUTH DAILY, Disp: 90 tablet, Rfl: 3   Blood Pressure KIT, Check blood pressure two to three times a week, Disp: 1 kit, Rfl: 0   Cholecalciferol  (VITAMIN D -3) 125 MCG (5000 UT) TABS, Take 5,000 Units by mouth daily., Disp: , Rfl:    clopidogrel  (PLAVIX ) 75 MG tablet, Take 1 tablet (75 mg total) by mouth daily., Disp: 30 tablet, Rfl: 0   ezetimibe  (ZETIA ) 10 MG tablet, TAKE 1 TABLET BY MOUTH DAILY, Disp: 90 tablet, Rfl: 3   famotidine  (PEPCID ) 20 MG tablet, TAKE  1 TABLET BY MOUTH 2 TIMES DAILY AS NEEDED FOR HEARTBURN/INDIGESTION. D/C ZANTAC , Disp: 180 tablet, Rfl: 3   fluticasone  (FLONASE ) 50 MCG/ACT nasal spray, Place 1 spray into both nostrils daily as needed for  allergies., Disp: , Rfl:    levETIRAcetam  (KEPPRA ) 500 MG tablet, Take 1 tablet (500 mg total) by mouth 2 (two) times daily., Disp: 180 tablet, Rfl: 4   lisinopril  (ZESTRIL ) 10 MG tablet, Take 1 tablet (10 mg total) by mouth daily., Disp: 90 tablet, Rfl: 3   metFORMIN  (GLUCOPHAGE ) 500 MG tablet, Take 1 tablet (500 mg total) by mouth 2 (two) times daily with a meal., Disp: 180 tablet, Rfl: 0   metoprolol  succinate (TOPROL -XL) 25 MG 24 hr tablet, Take 1 tablet (25 mg total) by mouth daily., Disp: 90 tablet, Rfl: 3   prednisoLONE  acetate (PRED FORTE ) 1 % ophthalmic suspension, Place 1 drop into both eyes daily., Disp: , Rfl:    sildenafil  (REVATIO ) 20 MG tablet, TAKE 1 TABLET BY MOUTH DAILY AS NEEDED, Disp: 30 tablet, Rfl: 0   vitamin B-12 (CYANOCOBALAMIN ) 500 MCG tablet, Take 500 mcg by mouth daily., Disp: , Rfl:   Vitals   Vitals:   08-28-24 1555  BP: (!) 154/90  Pulse: 67  Resp: 18  Temp: 98.1 F (36.7 C)  TempSrc: Oral  SpO2: 100%    There is no height or weight on file to calculate BMI.   Physical Exam   Constitutional: Appears well-developed and well-nourished.  Psych: Affect appropriate to situation.  Eyes: No scleral injection.  HENT: No OP obstruction.  Head: Normocephalic.  Cardiovascular: Normal rate and regular rhythm.  Respiratory: Effort normal, non-labored breathing.  GI: Soft.  No distension. There is no tenderness.  Skin: WDI.  Neurologic Examination  Awake alert oriented x 3 No dysarthria Mild aphasia-has trouble with long sentences where he develops a word salad Cranial nerves II to XII intact Motor or sensory and coordination exam unremarkable for abnormality  Labs/Imaging/Neurodiagnostic studies   CBC:  Recent Labs  Lab 08/28/2024 1528  WBC 7.8  NEUTROABS 5.0  HGB 14.2  HCT 42.3  MCV 97.2  PLT 178   Basic Metabolic Panel:  Lab Results  Component Value Date   NA 140 Aug 28, 2024   K 4.1 08/28/24   CO2 25 08/28/24   GLUCOSE 121 (H)  08-28-24   BUN 18 2024-08-28   CREATININE 1.08 28-Aug-2024   CALCIUM  9.3 08/28/24   GFRNONAA >60 Aug 28, 2024   GFRAA >60 11/29/2017   Lipid Panel:  Lab Results  Component Value Date   LDLCALC 35 06/28/2024   HgbA1c:  Lab Results  Component Value Date   HGBA1C 6.6 (H) 06/28/2024   Urine Drug Screen:     Component Value Date/Time   LABOPIA NONE DETECTED 02/23/2023 1057   COCAINSCRNUR NONE DETECTED 02/23/2023 1057   LABBENZ NONE DETECTED 02/23/2023 1057   AMPHETMU NONE DETECTED 02/23/2023 1057   THCU NONE DETECTED 02/23/2023 1057   LABBARB NONE DETECTED 02/23/2023 1057    Alcohol Level     Component Value Date/Time   [facility] <15 08/28/2024 1528   INR  Lab Results  Component Value Date   INR 1.0 August 28, 2024   APTT  Lab Results  Component Value Date   APTT 28 28-Aug-2024   CT Head without contrast(Personally reviewed): No acute findings.  Chronic areas of encephalomalacia in the left MCA PCA watershed.  CT angio Head and Neck with contrast(Personally reviewed): No LVO   ASSESSMENT   Shannon Chung  is a 83 y.o. male presenting with severe repeat word finding difficulty, which has been treated in the past with TNK with negative MRI ensuing. His EEG has been abnormal in the past with left cortical dysfunction I suspect that current presentation is likely related to electrographic abnormality rather than a vascular etiology.  Impression: Word-finding difficulty-evaluate for underlying nonconvulsive seizures Evaluate for stroke-low on the differentials but remains possibility due to known left carotid stenosis and stemt  RECOMMENDATIONS  MRI brain without contrast-stroke risk factor workup only if MRI is positive for stroke. Routine EEG Additional Keppra  1 g IV x 1 now Increase Keppra  dose to 750 mg twice daily Seizure precautions Check UA and chest x-ray Continue home dual antiplatelets and statin for stroke prevention Plan discussed with Dr. Jacolyn and  patient's wife at bedside Neurology will follow ______________________________________________________________________    Signed, Eligio Lav, MD [facility]

## 2024-08-01 NOTE — Progress Notes (Signed)
       CROSS COVER NOTE  NAME: Shannon Chung MRN: [id number] DOB : 01/16/1941    Concern as stated by nurse / staff   CCMD called and told this writer that the patient had a run of Bigimen[place]. He is here for Stroke like symptoms.      Pertinent findings on chart review: Patient admitted earlier with word finding difficulty, MRI showing acute stroke  MRI: Scattered patchy small volume acute ischemic nonhemorrhagic left MCA distribution infarcts   Patient Assessment    08/01/2024    8:13 PM 08/01/2024    6:30 PM 08/01/2024    6:04 PM  Vitals with BMI  Height 6' 1    Weight 191 lbs 2 oz  199 lbs 6 oz  BMI 25.22    Systolic 123 139   Diastolic 84 75   Pulse 58        Latest Ref Rng & Units 08/02/2024   12:25 AM 08/01/2024    3:28 PM 06/28/2024    1:16 PM  BMP  Glucose 70 - 99 mg/dL 891  878  883   BUN 8 - 23 mg/dL 15  18  19    Creatinine 0.61 - 1.24 mg/dL 9.02  8.91  9.15   Sodium 135 - 145 mmol/L 139  140  141   Potassium 3.5 - 5.1 mmol/L 3.9  4.1  4.2   Chloride 98 - 111 mmol/L 104  103  104   CO2 22 - 32 mmol/L 25  25  26    Calcium  8.9 - 10.3 mg/dL 9.0  9.3  9.2    Magnesium  2.1    Assessment and  Interventions   Assessment:  Ventricular bigeminy Acute CVA  Plan: Will ensure electrolytes are okay-check K and mag Get EKG and upload strips Can consider card consult in the am --> oral K 40 meq

## 2024-08-01 NOTE — ED Provider Notes (Signed)
 [facility] Provider Note    Event Date/Time   First MD Initiated Contact with Patient 08/01/24 1528     (approximate)   History   Code Stroke   HPI  Shannon Chung is a 83 y.o. male with a history of CVA, prostate cancer, diabetes, and atrial fibrillation with a loop recorder who presents with aphasia.  The patient was last seen at his baseline around 11 AM.  He reports some difficulty speaking although states it is relatively mild.  He denies any weakness or numbness in his arm or legs.  He has no vision changes.  He has no chest pain or difficulty breathing.  I reviewed the past medical records.  The patient's most recent outpatient visit was with primary care on 8/27 for follow-up of his chronic conditions.  He had a CVA in 2024 status post a carotid artery stent.  He was subsequent admitted in June/July with recurrent episodes of expressive aphasia but workup was negative at that time.   Physical Exam   Triage Vital Signs: ED Triage Vitals  Encounter Vitals Group     BP      Girls Systolic BP Percentile      Girls Diastolic BP Percentile      Boys Systolic BP Percentile      Boys Diastolic BP Percentile      Pulse      Resp      Temp      Temp src      SpO2      Weight      Height      Head Circumference      Peak Flow      Pain Score      Pain Loc      Pain Education      Exclude from Growth Chart     Most recent vital signs: Vitals:   08/01/24 1555 08/01/24 1700  BP: (!) 154/90 105/75  Pulse: 67 65  Resp: 18 15  Temp: 98.1 F (36.7 C)   SpO2: 100% 98%     General: Alert and oriented, no distress.  CV:  Good peripheral perfusion.  Resp:  Normal effort.  Abd:  No distention.  Other:  Mild expressive aphasia only on certain words and phrases.  No dysarthria.  No facial droop.  5/5 motor strength and intact sensation all extremities.  No pronator drift.  No ataxia.   ED Results / Procedures / Treatments   Labs (all  labs ordered are listed, but only abnormal results are displayed) Labs Reviewed  COMPREHENSIVE METABOLIC PANEL WITH GFR - Abnormal; Notable for the following components:      Result Value   Glucose, Bld 121 (*)    All other components within normal limits  CBG MONITORING, ED - Abnormal; Notable for the following components:   Glucose-Capillary 114 (*)    All other components within normal limits  ETHANOL  PROTIME-INR  APTT  CBC  DIFFERENTIAL  URINE DRUG SCREEN, QUALITATIVE ([facility] ONLY)     EKG  ED ECG REPORT I, Shannon Chung, the attending physician, personally viewed and interpreted this ECG.  Date: 08/01/2024 EKG Time: 1554 Rate: 61 Rhythm: normal sinus rhythm with PVCs (incorrectly read by machine as atrial flutter) QRS Axis: normal Intervals: normal ST/T Wave abnormalities: normal Narrative Interpretation: no evidence of acute ischemia   RADIOLOGY  CT head: I independently viewed and interpreted the images; there is no ICH.  Radiology report indicates  no acute abnormality  CTA head/neck:   IMPRESSION:  1. No acute large vessel occlusion.  2. No evidence of ischemia by CT brain perfusion.  3. Stent extending from the distal left common carotid artery into the proximal  left cervical ICA. The vessel is patent proximal and distal to the stent.  4. Mixed atherosclerotic plaque resulting in approximately 60% stenosis of the  proximal right cervical ICA.  5. Atherosclerosis at the left vertebral artery origin resulting in moderate  stenosis.  6. Atherosclerosis of the bilateral carotid siphons with mild stenosis of the  right cavernous ICA and mild to moderate stenosis of the left cavernous and  paraclinoid ICA.    PROCEDURES:  Critical Care performed: Yes, see critical care procedure note(s)  .Critical Care  Performed by: Jacolyn Pae, MD Authorized by: Jacolyn Pae, MD   Critical care provider statement:    Critical care time (minutes):   30   Critical care time was exclusive of:  Separately billable procedures and treating other patients   Critical care was necessary to treat or prevent imminent or life-threatening deterioration of the following conditions:  CNS failure or compromise   Critical care was time spent personally by me on the following activities:  Development of treatment plan with patient or surrogate, discussions with consultants, evaluation of patient's response to treatment, examination of patient, ordering and review of laboratory studies, ordering and review of radiographic studies, ordering and performing treatments and interventions, pulse oximetry, re-evaluation of patient's condition, review of old charts and obtaining history from patient or surrogate   Care discussed with: admitting provider      MEDICATIONS ORDERED IN ED: Medications  iohexol  (OMNIPAQUE ) 350 MG/ML injection 100 mL (100 mLs Intravenous Contrast Given 08/01/24 1533)  levETIRAcetam  (KEPPRA ) undiluted injection 1,000 mg (1,000 mg Intravenous Given 08/01/24 1605)     IMPRESSION / MDM / ASSESSMENT AND PLAN / ED COURSE  I reviewed the triage vital signs and the nursing notes.  83 year old male with PMH as noted above presents with acute onset of expressive aphasia with an otherwise nonfocal neuroexam.  Code stroke was activated by EMS.  Differential diagnosis includes, but is not limited to, CVA, TIA, complex migraine, aphasia due to other unknown etiology.  Patient's presentation is most consistent with acute presentation with potential threat to life or bodily function.  The patient is on the cardiac monitor to evaluate for evidence of arrhythmia and/or significant heart rate changes.  I consulted and discussed the case with Dr. Voncile from neurology who responded to the ED obtain lab workup, CTA head/neck, give a dose of Keppra  since the patient missed his dose today, and Dr. Voncile recommends inpatient admission for further workup.  There  is no indication for TNK at this time.  ----------------------------------------- 5:36 PM on 08/01/2024 -----------------------------------------  CTA shows no evidence of LVO.  I consulted Dr. Awanda from the hospitalist service; based on our discussion she agrees to evaluate the patient for admission.   FINAL CLINICAL IMPRESSION(S) / ED DIAGNOSES   Final diagnoses:  Expressive aphasia     Rx / DC Orders   ED Discharge Orders     None        Note:  This document was prepared using Dragon voice recognition software and may include unintentional dictation errors.    Jacolyn Pae, MD 08/01/24 1736

## 2024-08-01 NOTE — Progress Notes (Signed)
   08/01/24 1520  Spiritual Encounters  Type of Visit Initial  Care provided to: Patient;Friend  Referral source Code page  Reason for visit Code  OnCall Visit Yes  Interventions  Spiritual Care Interventions Made Established relationship of care and support;Compassionate presence  Intervention Outcomes  Outcomes Connection to spiritual care;Awareness around self/spiritual resourses  Spiritual Care Plan  Spiritual Care Issues Still Outstanding No further spiritual care needs at this time (see row info)   Chaplain responded to code stroke in ED and took emergency contact, [place]  Delight, to the room. Pt stated they didn't need anything but thanked chaplain for coming.

## 2024-08-02 ENCOUNTER — Observation Stay (HOSPITAL_COMMUNITY)
Admit: 2024-08-02 | Discharge: 2024-08-02 | Disposition: A | Discharge status: Home / Self Care | Attending: Family Medicine | Admitting: Family Medicine

## 2024-08-02 DIAGNOSIS — I6389 Other cerebral infarction: Secondary | ICD-10-CM | POA: Diagnosis not present

## 2024-08-02 DIAGNOSIS — Z7902 Long term (current) use of antithrombotics/antiplatelets: Secondary | ICD-10-CM | POA: Diagnosis not present

## 2024-08-02 DIAGNOSIS — I10 Essential (primary) hypertension: Secondary | ICD-10-CM | POA: Diagnosis present

## 2024-08-02 DIAGNOSIS — I779 Disorder of arteries and arterioles, unspecified: Secondary | ICD-10-CM | POA: Diagnosis not present

## 2024-08-02 DIAGNOSIS — Z8616 Personal history of COVID-19: Secondary | ICD-10-CM | POA: Diagnosis not present

## 2024-08-02 DIAGNOSIS — Z751 Person awaiting admission to adequate facility elsewhere: Secondary | ICD-10-CM | POA: Diagnosis not present

## 2024-08-02 DIAGNOSIS — Z951 Presence of aortocoronary bypass graft: Secondary | ICD-10-CM | POA: Diagnosis not present

## 2024-08-02 DIAGNOSIS — Z8249 Family history of ischemic heart disease and other diseases of the circulatory system: Secondary | ICD-10-CM | POA: Diagnosis not present

## 2024-08-02 DIAGNOSIS — Z96652 Presence of left artificial knee joint: Secondary | ICD-10-CM | POA: Diagnosis present

## 2024-08-02 DIAGNOSIS — I4891 Unspecified atrial fibrillation: Secondary | ICD-10-CM | POA: Diagnosis present

## 2024-08-02 DIAGNOSIS — Z23 Encounter for immunization: Secondary | ICD-10-CM | POA: Diagnosis present

## 2024-08-02 DIAGNOSIS — I2510 Atherosclerotic heart disease of native coronary artery without angina pectoris: Secondary | ICD-10-CM | POA: Diagnosis present

## 2024-08-02 DIAGNOSIS — Z7982 Long term (current) use of aspirin: Secondary | ICD-10-CM | POA: Diagnosis not present

## 2024-08-02 DIAGNOSIS — I629 Nontraumatic intracranial hemorrhage, unspecified: Secondary | ICD-10-CM | POA: Diagnosis present

## 2024-08-02 DIAGNOSIS — Z79899 Other long term (current) drug therapy: Secondary | ICD-10-CM | POA: Diagnosis not present

## 2024-08-02 DIAGNOSIS — Z85828 Personal history of other malignant neoplasm of skin: Secondary | ICD-10-CM | POA: Diagnosis not present

## 2024-08-02 DIAGNOSIS — R4701 Aphasia: Secondary | ICD-10-CM | POA: Diagnosis present

## 2024-08-02 DIAGNOSIS — R29701 NIHSS score 1: Secondary | ICD-10-CM | POA: Diagnosis present

## 2024-08-02 DIAGNOSIS — Z923 Personal history of irradiation: Secondary | ICD-10-CM | POA: Diagnosis not present

## 2024-08-02 DIAGNOSIS — E119 Type 2 diabetes mellitus without complications: Secondary | ICD-10-CM | POA: Diagnosis present

## 2024-08-02 DIAGNOSIS — I255 Ischemic cardiomyopathy: Secondary | ICD-10-CM | POA: Diagnosis present

## 2024-08-02 DIAGNOSIS — E785 Hyperlipidemia, unspecified: Secondary | ICD-10-CM | POA: Diagnosis present

## 2024-08-02 DIAGNOSIS — Z6372 Alcoholism and drug addiction in family: Secondary | ICD-10-CM | POA: Diagnosis not present

## 2024-08-02 DIAGNOSIS — R2990 Unspecified symptoms and signs involving the nervous system: Secondary | ICD-10-CM | POA: Diagnosis not present

## 2024-08-02 DIAGNOSIS — Z833 Family history of diabetes mellitus: Secondary | ICD-10-CM | POA: Diagnosis not present

## 2024-08-02 DIAGNOSIS — Z87891 Personal history of nicotine dependence: Secondary | ICD-10-CM | POA: Diagnosis not present

## 2024-08-02 DIAGNOSIS — Z7984 Long term (current) use of oral hypoglycemic drugs: Secondary | ICD-10-CM | POA: Diagnosis not present

## 2024-08-02 LAB — URINE DRUG SCREEN, QUALITATIVE (ARMC ONLY)
Amphetamines, Ur Screen: NOT DETECTED
Barbiturates, Ur Screen: NOT DETECTED
Benzodiazepine, Ur Scrn: NOT DETECTED
Cannabinoid 50 Ng, Ur ~~LOC~~: NOT DETECTED
Cocaine Metabolite,Ur ~~LOC~~: NOT DETECTED
MDMA (Ecstasy)Ur Screen: NOT DETECTED
Methadone Scn, Ur: NOT DETECTED
Opiate, Ur Screen: NOT DETECTED
Phencyclidine (PCP) Ur S: NOT DETECTED
Tricyclic, Ur Screen: NOT DETECTED

## 2024-08-02 LAB — LIPID PANEL
Cholesterol: 111 mg/dL (ref 0–200)
HDL: 47 mg/dL (ref >40)
LDL Cholesterol: 45 mg/dL (ref 0–99)
Total CHOL/HDL Ratio: 2.4 ratio
Triglycerides: 94 mg/dL (ref <150)
VLDL: 19 mg/dL (ref 0–40)

## 2024-08-02 LAB — BASIC METABOLIC PANEL WITH GFR
Anion gap: 10 (ref 5–15)
BUN: 15 mg/dL (ref 8–23)
CO2: 25 mmol/L (ref 22–32)
Calcium: 9 mg/dL (ref 8.9–10.3)
Chloride: 104 mmol/L (ref 98–111)
Creatinine, Ser: 0.97 mg/dL (ref 0.61–1.24)
GFR, Estimated: >60 mL/min (ref >60)
Glucose, Bld: 108 mg/dL — ABNORMAL HIGH (ref 70–99)
Potassium: 3.9 mmol/L (ref 3.5–5.1)
Sodium: 139 mmol/L (ref 135–145)

## 2024-08-02 LAB — ECHOCARDIOGRAM COMPLETE BUBBLE STUDY
AR max vel: 3.47 cm2
AV Area VTI: 3.97 cm2
AV Area mean vel: 3.25 cm2
AV Mean grad: 4 mmHg
AV Peak grad: 7.6 mmHg
Ao pk vel: 1.38 m/s
Area-P 1/2: 2.32 cm2
MV VTI: 3.2 cm2
S' Lateral: 4 cm

## 2024-08-02 LAB — MAGNESIUM: Magnesium: 2.1 mg/dL (ref 1.7–2.4)

## 2024-08-02 MED ORDER — PERFLUTREN LIPID MICROSPHERE
1.0000 mL | INTRAVENOUS | Status: AC | PRN
  Administered 2024-08-02: 3 mL via INTRAVENOUS

## 2024-08-02 MED ORDER — POTASSIUM CHLORIDE CRYS ER 20 MEQ PO TBCR
40.0000 meq | EXTENDED_RELEASE_TABLET | Freq: Once | ORAL | Status: AC
  Administered 2024-08-02: 40 meq via ORAL
  Filled 2024-08-02: qty 2

## 2024-08-02 MED ORDER — LEVETIRACETAM 500 MG PO TABS
500.0000 mg | ORAL_TABLET | Freq: Two times a day (BID) | ORAL | Status: DC
  Administered 2024-08-02 – 2024-08-06 (×8): 500 mg via ORAL
  Filled 2024-08-02 (×8): qty 1

## 2024-08-02 NOTE — Progress Notes (Signed)
 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  Patient presents this admission with difficulty speaking and increased balance issues.  On arrival to the ED labs and vitals were unremarkable.  Code stroke was activated.  Head CT was without acute findings.  CTA head and neck with and without LVO.  Neurology was consulted.  Brain MRI performed which shows acute left hemispheric strokes in a watershed pattern.  Subjective: This morning patient is working with occupational therapy.  He is doing well.  He denies any acute pain.  He does demonstrate some difficulty with word finding   Objective: Vitals:   08/01/24 2336 08/02/24 0320 08/02/24 0750 08/02/24 1230  BP: 104/69 113/67 122/77 109/66  Pulse: 72 68 (!) 105 66  Resp:   18 18  Temp: (!) 97.4 F (36.3 C) (!) 97.5 F (36.4 C) 97.8 F (36.6 C) 98 F (36.7 C)  TempSrc: Oral Oral Oral Oral  SpO2: 96% 93% 96% 100%  Weight:      Height:        Intake/Output Summary (Last 24 hours) at 08/02/2024 1337 Last data filed at 08/02/2024 1100 Gross per 24 hour  Intake 0 ml  Output 150 ml  Net -150 ml   Filed Weights   08/01/24 1804 08/01/24 2013  Weight: 90.4 kg 86.7 kg    Examination: General exam: Appears calm and comfortable, NAD  Respiratory system: No work of breathing, symmetric chest wall expansion Cardiovascular system: S1 & S2 heard, RRR.  Gastrointestinal system: Abdomen is nondistended, soft and nontender.  Neuro: Alert, oriented.  Word-finding difficulty. Extremities:  Symmetric, expected ROM Skin: No rashes, lesions Psychiatry: Demonstrates appropriate judgement and insight. Mood & affect appropriate for situation.   Assessment & Plan:  Principal Problem:   Stroke-like symptoms    Acute ischemic infarct Aphasia - Brain MRI with watershed pattern in the left hemisphere.  Likely secondary to large vessel disease. - A1c and LDL at goal - Continue aspirin , statin, Plavix  - Echocardiogram with bubble study ordered - Neurology consulted - Continue Keppra  at home dose - PT/OT/SLP.  Will likely need IPR/SNF at DC.  TOC consulted  Coronary artery stenosis, status post left stent - Discussed current stent with vascular surgery.  Patent on CT.  Vascular surgery sees no need for acute intervention at this time.  Continue outpatient follow-up - Continue with aspirin  Plavix   Ventricular bigeminy - Noted overnight on telemetry - Continue telemetry monitoring - Maintain normalize electrolytes - Echocardiogram ordered and pending as above  Diabetes - Hemoglobin A1c 6.6%, resume metformin  at discharge - No indication for insulin  at this time  Hypertension - Permissive hypertension for 48 hours.  Gradually resume BP meds  History of CAD status post 5 vessel CABG - Continue aspirin , statin, Plavix , Zetia    DVT prophylaxis: Lovenox    Code Status: Full Code Disposition:  Will need SNF vs IPR at DC, [facility] consulted   Consultants:    Procedures:    Antimicrobials:  Anti-infectives (From admission, onward)    None       Data Reviewed: I have personally  reviewed following labs and imaging studies CBC: Recent Labs  Lab 08/01/24 1528  WBC 7.8  NEUTROABS 5.0  HGB 14.2  HCT 42.3  MCV 97.2  PLT 178   Basic Metabolic Panel: Recent Labs  Lab 08/01/24 1528 08/02/24 0025  NA 140 139  K 4.1 3.9  CL 103 104  CO2 25 25  GLUCOSE 121* 108*  BUN 18 15  CREATININE 1.08 0.97  CALCIUM  9.3 9.0  MG  --  2.1   GFR: Estimated Creatinine  Clearance: 68.6 mL/min (by C-G formula based on SCr of 0.97 mg/dL). Liver Function Tests: Recent Labs  Lab 08/01/24 1528  AST 21  ALT 14  ALKPHOS 49  BILITOT 0.9  PROT 7.5  ALBUMIN  4.3   CBG: Recent Labs  Lab 08/01/24 1525  GLUCAP 114*    No results found for this or any previous visit (from the past 240 hours).   Radiology Studies: MR BRAIN WO CONTRAST Result Date: 08/01/2024 CLINICAL DATA:  Initial evaluation for acute neuro deficit, stroke suspected. EXAM: MRI HEAD WITHOUT CONTRAST TECHNIQUE: Multiplanar, multiecho pulse sequences of the brain and surrounding structures were obtained without intravenous contrast. COMPARISON:  CTs from earlier the same day. FINDINGS: Brain: Generalized age-related cerebral atrophy. Patchy T2/FLAIR hyperintensity involving the supratentorial cerebral white matter, consistent with chronic small vessel ischemic disease, mild in nature. Few small remote lacunar infarcts present about the left greater than right basal ganglia. Encephalomalacia gliosis at the posterior left frontoparietal region near the vertex, consistent with prior infarcts. Scattered patchy small volume foci of restricted diffusion seen involving the cortical subcortical left frontal, parietal, and temporal lobes, left MCA distribution, consistent with acute ischemic infarcts. These are superimposed on the underlying chronic left frontoparietal infarct. These are somewhat watershed in distribution. Patchy involvement of the basal ganglia noted as well. No associated hemorrhage or mass effect. No other evidence for acute or subacute ischemia. No acute intracranial hemorrhage. Single punctate chronic microhemorrhage noted at the left frontal centrum semi ovale, of doubtful significance in isolation. No mass lesion, midline shift or mass effect no hydrocephalus or extra-axial fluid collection. Partially empty sella noted. Vascular: Major intracranial vessel flow voids are maintained. Skull and  upper cervical spine: Cranial junction with normal limits. Bone marrow signal intensity normal. No scalp soft tissue abnormality. Sinuses/Orbits: Prior bilateral ocular lens replacement. Few small retention cysts noted about the maxillary sinuses. Paranasal sinuses are otherwise largely clear. Small bilateral mastoid effusions noted, of doubtful significance. Image nasopharynx unremarkable. Other: None. IMPRESSION: 1. Scattered patchy small volume acute ischemic nonhemorrhagic left MCA distribution infarcts as above. No associated mass effect. 2. Underlying chronic left frontoparietal infarct, with additional small remote lacunar infarcts about the left greater than right basal ganglia. 3. Underlying age-related cerebral atrophy with mild chronic small vessel ischemic disease. Electronically Signed   By: Morene Hoard M.D.   On: 08/01/2024 22:28   DG Chest Port 1 View Result Date: 08/01/2024 EXAM: 1 VIEW(S) XRAY OF THE CHEST 08/01/2024 08:26:00 PM COMPARISON: 04/15/2021 CLINICAL HISTORY: [id number] Stroke-like symptoms [id number]. The reason for exam is stated as stroke-like symptoms. Hx of diabetes, HTN, prostate cancer, cardiac cath, and former smoker. FINDINGS: LINES, TUBES AND DEVICES: Loop recorder is present. LUNGS AND PLEURA: No focal pulmonary opacity. No pulmonary edema. No pleural effusion. No pneumothorax. HEART AND MEDIASTINUM: Stable cardiomediastinal silhouette. Aortic atherosclerotic calcification. BONES AND SOFT TISSUES: Sternotomy wires are present. IMPRESSION: 1. No acute process. Electronically signed by: Norman Gatlin MD 08/01/2024 08:28 PM EDT RP Workstation:  [id number]   CT ANGIO HEAD NECK W WO CM W PERF (CODE STROKE) Result Date: 08/01/2024 EXAM: CTA Head and Neck with Perfusion 08/01/2024 03:45:33 PM TECHNIQUE: CTA of the head and neck was performed with and without the administration of 100 mL of iohexol  (OMNIPAQUE ) 350 MG/ML injection. 3D postprocessing with multiplanar  reconstructions and MIPs was performed to evaluate the vascular anatomy. Cerebral perfusion analysis using computed tomography with contrast administration, including post-processing of parametric maps with determination of cerebral blood flow, cerebral blood volume, mean transit time and time-to-maximum. Automated exposure control, iterative reconstruction, and/or weight based adjustment of the mA/kV was utilized to reduce the radiation dose to as low as reasonably achievable. COMPARISON: CT head and CTA head and neck dated 04/26/2023. CLINICAL HISTORY: Neuro deficit, acute, stroke suspected. Code stroke Dr. Voncile [phone]. FINDINGS: CTA NECK: AORTIC ARCH AND ARCH VESSELS: Moderate atherosclerosis of the visualized aortic arch. Atherosclerosis involving the proximal aspect of both subclavian arteries without high grade stenosis. No dissection or arterial injury. CERVICAL CAROTID ARTERIES: Right: Mixed atherosclerotic plaque along the distal right common carotid artery resulting in mild stenosis. Additional mixed atherosclerotic plaque at the right carotid bifurcation extending into the proximal right cervical ICA resulting in approximately 60% stenosis of the proximal right cervical ICA. There is additional stenosis at the origin of the right external carotid artery. Left: Mixed atherosclerotic plaque in the left common carotid artery. Stent extending from the distal left common carotid artery into the proximal left cervical ICA. There is slightly limited evaluation although the visualized portions of the stent appear patent and the vessel is patent proximal and distal to the stent. No evidence of hemodynamically significant stenosis of the left cervical ICA. No dissection or arterial injury. CERVICAL VERTEBRAL ARTERIES: The left vertebral artery is dominant. Atherosclerosis at the left vertebral artery origin resulting in moderate stenosis. Additional multifocal atherosclerosis of the V2 and V4 segments of the  left vertebral artery. No dissection or arterial injury. LUNGS AND MEDIASTINUM: Medial sternotomy. Multiple nodules in the right thyroid  lobe measuring up to 1.1 cm without findings to suggest additional required follow up. SOFT TISSUES: No acute abnormality. BONES: Degenerative changes throughout the visualized spine. Disc space narrowing most pronounced at C5-C6 and C6-C7. No acute abnormality. CTA HEAD: ANTERIOR CIRCULATION: The intracranial internal carotid arteries are patent bilaterally. Atherosclerosis of the bilateral carotid siphons. There is mild stenosis of the right cavernous ICA. Additional mild to moderate stenosis of the left cavernous and paraclinoid ICA. The middle cerebral arteries are patent bilaterally. The anterior cerebral arteries are patent bilaterally. No aneurysm. POSTERIOR CIRCULATION: No significant stenosis of the posterior cerebral arteries. No significant stenosis of the basilar artery. No significant stenosis of the vertebral arteries. No aneurysm. OTHER: No dural venous sinus thrombosis on this non-dedicated study. CT PERFUSION: EXAM QUALITY: Exam quality is adequate with diagnostic perfusion maps. No significant motion artifact. Appropriate arterial inflow and venous outflow curves. CORE INFARCT (CBF<30% volume): 0 mL TOTAL HYPOPERFUSION (Tmax>6s volume): 0 mL PENUMBRA: Mismatch volume: 0 mL Mismatch ratio: not applicable Location: not applicable IMPRESSION: 1. No acute large vessel occlusion. 2. No evidence of ischemia by CT brain perfusion. 3. Stent extending from the distal left common carotid artery into the proximal left cervical ICA. The vessel is patent proximal and distal to the stent. 4. Mixed atherosclerotic plaque resulting in approximately 60% stenosis of the proximal right cervical ICA. 5. Atherosclerosis at the left vertebral artery origin resulting in moderate stenosis. 6. Atherosclerosis of the bilateral carotid siphons  with mild stenosis of the right cavernous ICA  and mild to moderate stenosis of the left cavernous and paraclinoid ICA. Electronically signed by: Donnice Mania MD 08/01/2024 04:13 PM EDT RP Workstation: [id number]   CT HEAD CODE STROKE WO CONTRAST Result Date: 08/01/2024 EXAM: CT HEAD WITHOUT CONTRAST 08/01/2024 03:32:31 PM TECHNIQUE: CT of the head was performed without the administration of intravenous contrast. Automated exposure control, iterative reconstruction, and/or weight based adjustment of the mA/kV was utilized to reduce the radiation dose to as low as reasonably achievable. COMPARISON: MRI head 05/02/2023. CLINICAL HISTORY: Neuro deficit, acute, stroke suspected. Code stroke. Dr. Arora [phone]. FINDINGS: BRAIN AND VENTRICLES: No acute hemorrhage. No evidence of acute infarct. Encephalomalacia in the left frontoparietal lobes near the vertex compatible with remote infarcts. There is signal abnormality in the central aspect of the pons which may be artifactual versus related to remote infarcts. Mild parenchymal volume loss. Additional remote infarct in the left caudate. Atherosclerosis of the carotid siphons and intracranial vertebral arteries. No hydrocephalus. No extra-axial collection. No mass effect or midline shift. ORBITS: Bilateral lens replacement. No acute abnormality. SINUSES: No acute abnormality. SOFT TISSUES AND SKULL: Small right mastoid effusion. No acute soft tissue abnormality. No skull fracture. [place] stroke program early CT (aspect) score: Ganglionic (caudate, ic, Lentiform Nucleus, insula, M1-m3): 7 Supraganglionic (m4-m6): 3 Total: 10 IMPRESSION: 1. No acute intracranial abnormality. 2. Encephalomalacia in the left frontoparietal lobes near the vertex compatible with remote infarcts. 3. Signal abnormality in the central aspect of the pons, possibly artifactual or related to remote infarct. 4. Additional remote infarct in the left caudate. 5. Mild parenchymal volume loss. 6. Findings messaged to Dr. Arora at 3:46PM on  08/01/24. Electronically signed by: Donnice Mania MD 08/01/2024 03:47 PM EDT RP Workstation: [id number]    Scheduled Meds:  aspirin  EC  162 mg Oral Daily   atorvastatin   40 mg Oral Daily   clopidogrel   75 mg Oral Daily   enoxaparin  (LOVENOX ) injection  40 mg Subcutaneous Q24H   ezetimibe   10 mg Oral Daily   levETIRAcetam   500 mg Oral BID   Continuous Infusions:   LOS: 0 days  MDM: Patient is high risk for one or more organ failure.  They necessitate ongoing hospitalization for continued IV therapies and subsequent lab monitoring. Total time spent interpreting labs and vitals, reviewing the medical record, coordinating care amongst consultants and care team members, directly assessing and discussing care with the patient and/or family: 55 min  Elisabeth Strom, DO [facility]  To contact the attending physician between 7A-7P please use Epic Chat. To contact the covering physician during after hours 7P-7A, please review Amion.  08/02/2024, 1:37 PM   *This document has been created with the assistance of dictation software. Please excuse typographical errors. *

## 2024-08-02 NOTE — Progress Notes (Signed)
  Inpatient Rehab Admissions Coordinator :  Per therapy recommendations, patient was screened for CIR candidacy by Ottie Glazier RN MSN.  At this time patient appears to be a potential candidate for CIR. I will place a rehab consult per protocol for full assessment. Please call me with any questions.  Ottie Glazier RN MSN Admissions Coordinator [phone]

## 2024-08-02 NOTE — TOC Transition Note (Signed)
 Transition of Care [facility]) - Discharge Note   Patient Details  Name: Shannon Chung MRN: [id number] Date of Birth: February 14, 1941  Transition of Care [facility] CM/SW Contact:  Dalia GORMAN Fuse, RN Phone Number: 08/02/2024, 1:02 PM   Clinical Narrative:     Patient presented from home with word finding difficulty. Medical record reviewed and patient has no TOC needs at this time. Please outreach to [facility] if needs are identified.         Patient Goals and CMS Choice            Discharge Placement                       Discharge Plan and Services Additional resources added to the After Visit Summary for                                       Social Drivers of Health (SDOH) Interventions SDOH Screenings   Food Insecurity: No Food Insecurity (08/01/2024)  Housing: Low Risk  (08/01/2024)  Transportation Needs: No Transportation Needs (08/01/2024)  Utilities: Not At Risk (08/01/2024)  Alcohol Screen: Low Risk  (12/23/2023)  Depression (PHQ2-9): Low Risk  (06/28/2024)  Financial Resource Strain: Medium Risk (06/27/2024)  Physical Activity: Sufficiently Active (06/27/2024)  Social Connections: Socially Integrated (08/01/2024)  Stress: No Stress Concern Present (06/27/2024)  Tobacco Use: Medium Risk (08/01/2024)  Health Literacy: Adequate Health Literacy (05/28/2023)     Readmission Risk Interventions     No data to display

## 2024-08-02 NOTE — Evaluation (Signed)
 Physical Therapy Evaluation Patient Details Name: Shannon Chung MRN: [id number] DOB: 20-Feb-1941 Today's Date: 08/02/2024  History of Present Illness  Pt is an 83 y.o. male presenting with word finding and balance difficulties. 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 male admitted for stroke-like symptoms. Prior to hospitalization, pt able to ambulate without AD and able to perform ADLs independently. 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  Pt now requires CGA for bed mobility; he is impulsive and has decreased safety awareness requiring verbal cuing to correct. Pt requires min A for STS and stand pivot trasnfer back into bed with multimodal cuing for sequencing and for safety. Pt able to amb 72ft using RW however demonstrates the following gait impairments: trunk flexed, dec DF on R LE, narrow BOS, and R knee flexion during stance. Pt intermittently able to correct posture with cuing however has tendency to bring RW too far therefore increasing trunk flexion. Pt also demonstrates increased postural sway with turns. Pt has deficits in strength/balance/coordination. Would benefit from skilled high-intensity PT to address above deficits and promote optimal return to PLOF.       If plan is discharge home, recommend the following: A little help with bathing/dressing/bathroom;Assistance with cooking/housework;Assist for transportation;Direct supervision/assist for medications management;Supervision due to cognitive status;A little help with walking and/or transfers   Can travel by private vehicle        Equipment Recommendations Other (comment) (TBD at next venue)  Recommendations for  Other Services       Functional Status Assessment Patient has had a recent decline in their functional status and demonstrates the ability to make significant improvements in function in a reasonable and predictable amount of time.     Precautions / Restrictions Precautions Precautions: Fall Recall of Precautions/Restrictions: Intact Restrictions Weight Bearing Restrictions Per Provider Order: No      Mobility  Bed Mobility Overal bed mobility: Needs Assistance Bed Mobility: Supine to Sit, Sit to Supine     Supine to sit: Contact guard Sit to supine: Contact guard assist   General bed mobility comments: Pt requires CGA for safety. Impulsive and required multiple cues to not get up before room set up is ready. Poor eccentric control upon sitting into bed.    Transfers Overall transfer level: Needs assistance Equipment used: Rolling walker (2 wheels) Transfers: Sit to/from Stand Sit to Stand: Min assist           General transfer comment: Pt with wide BOS upon standing and reaching for counter for increased support. Verbal/visual cuing for anterior hip translation to avoid excessive trunk flexion. Max cuing for hand placement to push from bed instead of pulling at RW.    Ambulation/Gait Ambulation/Gait assistance: Min assist Gait Distance (Feet): 80 Feet Assistive device: Rolling walker (2 wheels) Gait Pattern/deviations: Decreased dorsiflexion - right, Trunk flexed, Narrow base of support, Knee flexed in stance - right, Step-to pattern       General Gait Details: Min A for RW management as pt has tendency to push RW too far forward. Multimodal cuing for RW placement. Inc postural sway especially with turns.  Stairs            Wheelchair Mobility     Tilt Bed  Modified Rankin (Stroke Patients Only)       Balance Overall balance assessment: Needs assistance Sitting-balance support: Feet supported Sitting balance-Leahy Scale: Good Sitting balance -  Comments: steady reaching outside BOS   Standing balance support: Bilateral upper extremity supported, During functional activity, Reliant on assistive device for balance Standing balance-Leahy Scale: Poor Standing balance comment: Heavy reliance on RW for balance.                             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 Slowed RAMPs on R compared to L RUE Coordination: decreased gross motor    Lower Extremity Assessment Lower Extremity Assessment: RLE deficits/detail;Generalized weakness RLE Coordination: decreased gross motor    Cervical / Trunk Assessment Cervical / Trunk Assessment: Normal  Communication   Communication Communication: Impaired Factors Affecting Communication: Difficulty expressing self;Reduced clarity of speech (difficulty with word finding/needs increased time)    Cognition Arousal: Alert Behavior During Therapy: WFL for tasks assessed/performed   PT - Cognitive impairments: No apparent impairments                       PT - Cognition Comments: pt is pleasant  and agreeable to PT session Following commands: Intact       Cueing Cueing Techniques: Verbal cues, Visual cues     General Comments General comments (skin integrity, edema, etc.): BP WNL, RN notified of mobility status and pt's lightheadedness.    Exercises     Assessment/Plan    PT Assessment Patient needs continued PT services  PT Problem List Decreased strength;Decreased activity tolerance;Decreased balance;Decreased mobility;Decreased knowledge of use of DME;Decreased safety awareness       PT Treatment Interventions Gait training;DME instruction;Functional mobility training;Therapeutic activities;Therapeutic exercise;Balance training;Neuromuscular re-education;Patient/family education    PT Goals (Current goals can be found in the Care Plan section)  Acute Rehab PT Goals Patient Stated Goal: to get better and return to volunteering PT Goal Formulation: With patient Time For Goal Achievement: 08/16/24 Potential to Achieve Goals: Good    Frequency Min 2X/week     Co-evaluation               AM-PAC PT 6 Clicks Mobility  Outcome Measure Help needed turning from your back to your side while in a flat bed without using bedrails?: A Little Help needed moving from lying on your back to sitting on the side of a flat bed without using bedrails?: A Little Help needed moving to and from a bed to a chair (including a wheelchair)?: A Little Help needed standing up from a chair using your arms (e.g., wheelchair  or bedside chair)?: A Little Help needed to walk in hospital room?: A Lot Help needed climbing 3-5 steps with a railing? : A Lot 6 Click Score: 16    End of Session   Activity Tolerance: Patient tolerated treatment well Patient left: in bed;with bed alarm set;with call bell/phone within reach Nurse Communication: Mobility status PT Visit Diagnosis: Unsteadiness on feet (R26.81);Muscle weakness (generalized) (M62.81);Other symptoms and signs involving the  nervous system (R29.898)    Time: 1350-1400 PT Time Calculation (min) (ACUTE ONLY): 10 min   Charges:                 Mailen Newborn, SPT   Earl Zellmer 08/02/2024, 3:30 PM

## 2024-08-02 NOTE — Plan of Care (Signed)

## 2024-08-02 NOTE — Care Management Obs Status (Signed)
 MEDICARE OBSERVATION STATUS NOTIFICATION   Patient Details  Name: Shannon Chung MRN: [id number] Date of Birth: 1941/10/08   Medicare Observation Status Notification Given:  Yes    [name] W, CMA 08/02/2024, 10:58 AM

## 2024-08-02 NOTE — Evaluation (Signed)
 Occupational Therapy Evaluation Patient Details Name: Shannon Chung MRN: [id number] DOB: Sep 05, 1941 Today's Date: 08/02/2024   History of Present Illness   Pt is an 83 y.o. male presenting with word finding and balance difficulties. 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 PMH significant for CAD s/p CABGX5, CVA, dyslipidemia, HTN, hx of stereotypic spells of speech disturbance with EEG showing left hemispheric cortical dysfunction     Clinical Impressions Pt admitted with above. Prior to admission, pt was independent including driving and volunteering at [facility] weekly. Pt lives at home with fiancee who can provide 24/7 support at discharge. Pt presents with R-sided deficits in FMC/GMC coordination, difficulties with object manipulation and bimanual coordination, impaired balance, strength, cognition, and mobility. Pt received sitting EOB, MAX A to don shoes using seated figure four, performs STS MIN A using RW with max multimodal cuing for hand placement and sequencing, posterior bias and difficulties with anterior hip translation to achieve static standing with up to MIN A steadying assist at trunk. Pt endorses feeling lightheaded upon standing attempts, BP assessed and WNL from both seated/standing positions. Pt is able to ambulate a short distance from L side of bed to recliner, MIN A using RW with difficulties advancing RLE and cues for AD proximity with forward flexed posture. Pt is a high risk for falls. Anticipate pt will require up to MAX A for ADL performance with +1 assist t/f [facility] for toileting needs.   OT provided education regarding stroke recovery timeline, and discussed use of functional objects in room (deodorant, soap containers, tissue box) with targeted neuromuscular exercises to promote increased use + coordination of dominant RUE. Pt and fiancee verbalize understanding.   Pt would benefit from skilled OT services to address noted  impairments and functional limitations (see below for any additional details) in order to maximize safety and independence while minimizing falls risk and caregiver burden. Anticipate the need for follow up OT services upon acute hospital DC. Patient will benefit from intensive inpatient follow-up therapy, >3 hours/day      If plan is discharge home, recommend the following:   A lot of help with walking and/or transfers;A lot of help with bathing/dressing/bathroom;Direct supervision/assist for medications management;Direct supervision/assist for financial management;Assist for transportation;Supervision due to cognitive status     Functional Status Assessment   Patient has had a recent decline in their functional status and demonstrates the ability to make significant improvements in function in a reasonable and predictable amount of time.     Equipment Recommendations   None recommended by OT (defer to next LOC)     Recommendations for Other Services   Rehab consult     Precautions/Restrictions   Precautions Precautions: Fall Restrictions Weight Bearing Restrictions Per Provider Order: No     Mobility Bed Mobility Overal bed mobility: Needs Assistance             General bed mobility comments: NT, pt recieved sitting EOB eating breakfast    Transfers Overall transfer level: Needs assistance Equipment used: Rolling walker (2 wheels) Transfers: Sit to/from Stand Sit to Stand: Min assist           General transfer comment: pt with wide BOS (question proprioceptive awareness of RLE), requires cues to correct, stands with posterior bias and MIN A for upright posture with cues for anterior hip translation. max cues for hand placement with poor carryover, will continue to require education and reinforcement      Balance Overall balance assessment:  Needs assistance Sitting-balance support: Feet supported Sitting balance-Leahy Scale: Good   Postural control:  Posterior lean Standing balance support: Bilateral upper extremity supported, During functional activity, Reliant on assistive device for balance Standing balance-Leahy Scale: Poor Standing balance comment: unsteady during dynamic balance tasks                           ADL either performed or assessed with clinical judgement   ADL Overall ADL's : Needs assistance/impaired                     Lower Body Dressing: Minimal assistance;Sit to/from stand Lower Body Dressing Details (indicate cue type and reason): seated figure four to don shoes, decreased RUE FMC to adjust shoe. MAX A to fasten pants with zipper and button in standing Toilet Transfer: Minimal assistance;BSC/3in1;Rolling walker (2 wheels) Toilet Transfer Details (indicate cue type and reason): decreased motor planning with RLE vs LLE, pt requires physical assist for AD mgmt, cues for hand placement and attention to R side. pt tends to slide R foot instead of activating at hip for step pattern Toileting- Clothing Manipulation and Hygiene: Maximal assistance       Functional mobility during ADLs: Minimal assistance;Cueing for sequencing;Cueing for safety;Rolling walker (2 wheels) General ADL Comments: pt performed 4x STS transfers, max cues for hand placement. upon standing, pt endorsing feeling lightheaded. BP WNL. pt able to ambulate from around L side of bed to recliner with MIN A for stability, cues for step pattern decreased RLE coordination     Vision Baseline Vision/History: 1 Wears glasses Ability to See in Adequate Light: 0 Adequate Patient Visual Report: No change from baseline       Perception Perception: Impaired Preception Impairment Details: Inattention/Neglect Perception-Other Comments: R sided inattention (mild, but observed during functional reach, location of objects on R side)       Pertinent Vitals/Pain Pain Assessment Pain Assessment: No/denies pain     Extremity/Trunk  Assessment Upper Extremity Assessment Upper Extremity Assessment: Right hand dominant;RUE deficits/detail (RUE grossly 4/5, LUE 5/5.) RUE Deficits / Details: decreased FMC with item / object manipulation, difficulties performing digit opposition, impaired finger-to-nose RUE Coordination: decreased gross motor;decreased fine motor   Lower Extremity Assessment Lower Extremity Assessment: RLE deficits/detail RLE Coordination: decreased fine motor;decreased gross motor   Cervical / Trunk Assessment Cervical / Trunk Assessment: Normal   Communication Communication Communication: Impaired Factors Affecting Communication: Difficulty expressing self;Reduced clarity of speech (difficulties word-finding, good receptive language,)   Cognition Arousal: Alert Behavior During Therapy: WFL for tasks assessed/performed Cognition: Cognition impaired     Awareness: Intellectual awareness impaired   Attention impairment (select first level of impairment): Sustained attention Executive functioning impairment (select all impairments): Problem solving, Reasoning                   Following commands: Intact       Cueing  General Comments   Cueing Techniques: Verbal cues  BP WNL, RN notified of mobility status and pt's lightheadedness.   Exercises Exercises: Other exercises Other Exercises Other Exercises: discussed role and purpose of OT, edu on neuroplasticity and stroke recovery timeline with recommendation for use of dominant RUE during functional task performance. pt and fiance verbalize understanding Other Exercises: provided pt with functional grasp and release task given ADL objects (deodorant, soap, tissues), edu on importance of repetitive use for functional gains with opening/closing containers. Other Exercises: given that patient drives at baseline, discussed need  for clearance from MD prior to returning to driving   Shoulder Instructions      Home Living Family/patient  expects to be discharged to:: Private residence Living Arrangements: Spouse/significant other Available Help at Discharge: Available 24 hours/day Type of Home: House Home Access: Level entry     Home Layout: One level     Bathroom Shower/Tub: Producer, television/film/video: Standard     Home Equipment: Agricultural consultant (2 wheels);Cane - single point          Prior Functioning/Environment Prior Level of Function : Independent/Modified Independent;Driving             Mobility Comments: independent, 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[employer], 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 but spouse endorses pt mostly sedentary at home with poor balance ADLs Comments: independent, driving    OT Problem List: Decreased strength;Decreased range of motion;Impaired balance (sitting and/or standing);Decreased activity tolerance;Decreased coordination;Decreased cognition;Decreased safety awareness;Decreased knowledge of use of DME or AE;Decreased knowledge of precautions;Impaired UE functional use   OT Treatment/Interventions: Self-care/ADL training;Therapeutic exercise;Neuromuscular education;DME and/or AE instruction;Therapeutic activities;Patient/family education;Cognitive remediation/compensation;Balance training      OT Goals(Current goals can be found in the care plan section)   Acute Rehab OT Goals OT Goal Formulation: With patient/family Time For Goal Achievement: 08/16/24 Potential to Achieve Goals: Good   OT Frequency:  Min 3X/week       AM-PAC OT 6 Clicks Daily Activity     Outcome Measure Help from another person eating meals?: A Little Help from another person taking care of personal grooming?: A Little Help from another person toileting, which includes using toliet, bedpan, or urinal?: A Lot Help from another person bathing (including washing, rinsing, drying)?: A Lot Help from another person to put on and taking off regular upper body clothing?: A Little Help from another person to put on and taking  off regular lower body clothing?: A Lot 6 Click Score: 15   End of Session Equipment Utilized During Treatment: Rolling walker (2 wheels);Gait belt Nurse Communication: Mobility status  Activity Tolerance: Patient tolerated treatment well Patient left: in chair;with call bell/phone within reach;with chair alarm set;with family/visitor present  OT Visit Diagnosis: Other symptoms and signs involving the nervous system (R29.898);Other abnormalities of gait and mobility (R26.89);Unsteadiness on feet (R26.81);Muscle weakness (generalized) (M62.81);Cognitive communication deficit (R41.841) Symptoms and signs involving cognitive functions: Cerebral infarction                Time: 9045-8970 OT Time Calculation (min): 35 min Charges:  OT General Charges $OT Visit: 1 Visit OT Evaluation $OT Eval Moderate Complexity: 1 Mod OT Treatments $Neuromuscular Re-education: 8-22 mins  Laela Deviney L. Latalia Etzler, OTR/L  08/02/24, 1:22 PM

## 2024-08-02 NOTE — Plan of Care (Signed)
 Problem: Education: Goal: Knowledge of disease or condition will improve 08/02/2024 0602 by Nettie Norris, LPN Outcome: Progressing 08/02/2024 0505 by Nettie Norris, LPN Outcome: Progressing Goal: Knowledge of secondary prevention will improve (MUST DOCUMENT ALL) 08/02/2024 0602 by Nettie Norris, LPN Outcome: Progressing 08/02/2024 0505 by Nettie Norris, LPN Outcome: Progressing Goal: Knowledge of patient specific risk factors will improve (DELETE if not current risk factor) 08/02/2024 0602 by Nettie Norris, LPN Outcome: Progressing 08/02/2024 0505 by Nettie Norris, LPN Outcome: Progressing   Problem: Ischemic Stroke/TIA Tissue Perfusion: Goal: Complications of ischemic stroke/TIA will be minimized 08/02/2024 0602 by Nettie Norris, LPN Outcome: Progressing 08/02/2024 0505 by Nettie Norris, LPN Outcome: Progressing   Problem: Coping: Goal: Will verbalize positive feelings about self 08/02/2024 0602 by Nettie Norris, LPN Outcome: Progressing 08/02/2024 0505 by Nettie Norris, LPN Outcome: Progressing Goal: Will identify appropriate support needs 08/02/2024 0602 by Nettie Norris, LPN Outcome: Progressing 08/02/2024 0505 by Nettie Norris, LPN Outcome: Progressing   Problem: Health Behavior/Discharge Planning: Goal: Ability to manage health-related needs will improve 08/02/2024 0602 by Nettie Norris, LPN Outcome: Progressing 08/02/2024 0505 by Nettie Norris, LPN Outcome: Progressing Goal: Goals will be collaboratively established with patient/family 08/02/2024 0602 by Nettie Norris, LPN Outcome: Progressing 08/02/2024 0505 by Nettie Norris, LPN Outcome: Progressing   Problem: Self-Care: Goal: Ability to participate in self-care as condition permits will improve 08/02/2024 0602 by Nettie Norris, LPN Outcome: Progressing 08/02/2024 0505 by Nettie Norris, LPN Outcome: Progressing Goal: Verbalization  of feelings and concerns over difficulty with self-care will improve 08/02/2024 0602 by Nettie Norris, LPN Outcome: Progressing 08/02/2024 0505 by Nettie Norris, LPN Outcome: Progressing Goal: Ability to communicate needs accurately will improve 08/02/2024 0602 by Nettie Norris, LPN Outcome: Progressing 08/02/2024 0505 by Nettie Norris, LPN Outcome: Progressing   Problem: Nutrition: Goal: Risk of aspiration will decrease 08/02/2024 0602 by Nettie Norris, LPN Outcome: Progressing 08/02/2024 0505 by Nettie Norris, LPN Outcome: Progressing Goal: Dietary intake will improve 08/02/2024 0602 by Nettie Norris, LPN Outcome: Progressing 08/02/2024 0505 by Nettie Norris, LPN Outcome: Progressing   Problem: Education: Goal: Knowledge of General Education information will improve Description: Including pain rating scale, medication(s)/side effects and non-pharmacologic comfort measures 08/02/2024 0602 by Nettie Norris, LPN Outcome: Progressing 08/02/2024 0505 by Nettie Norris, LPN Outcome: Progressing   Problem: Health Behavior/Discharge Planning: Goal: Ability to manage health-related needs will improve 08/02/2024 0602 by Nettie Norris, LPN Outcome: Progressing 08/02/2024 0505 by Nettie Norris, LPN Outcome: Progressing   Problem: Clinical Measurements: Goal: Ability to maintain clinical measurements within normal limits will improve 08/02/2024 0602 by Nettie Norris, LPN Outcome: Progressing 08/02/2024 0505 by Nettie Norris, LPN Outcome: Progressing Goal: Will remain free from infection 08/02/2024 0602 by Nettie Norris, LPN Outcome: Progressing 08/02/2024 0505 by Nettie Norris, LPN Outcome: Progressing Goal: Diagnostic test results will improve 08/02/2024 0602 by Nettie Norris, LPN Outcome: Progressing 08/02/2024 0505 by Nettie Norris, LPN Outcome: Progressing Goal: Respiratory complications will  improve 08/02/2024 0602 by Nettie Norris, LPN Outcome: Progressing 08/02/2024 0505 by Nettie Norris, LPN Outcome: Progressing Goal: Cardiovascular complication will be avoided 08/02/2024 0602 by Nettie Norris, LPN Outcome: Progressing 08/02/2024 0505 by Nettie Norris, LPN Outcome: Progressing   Problem: Activity: Goal: Risk for activity intolerance will decrease 08/02/2024 0602 by Nettie Norris, LPN Outcome: Progressing 08/02/2024 0505 by Nettie Norris, LPN Outcome: Progressing   Problem: Nutrition: Goal: Adequate nutrition will be maintained 08/02/2024 0602 by Nettie Norris, LPN Outcome: Progressing 08/02/2024 0505 by Nettie Norris, LPN Outcome: Progressing   Problem: Coping: Goal: Level of anxiety will decrease  08/02/2024 0602 by Nettie Norris, LPN Outcome: Progressing 08/02/2024 0505 by Nettie Norris, LPN Outcome: Progressing   Problem: Elimination: Goal: Will not experience complications related to bowel motility 08/02/2024 0602 by Nettie Norris, LPN Outcome: Progressing 08/02/2024 0505 by Nettie Norris, LPN Outcome: Progressing Goal: Will not experience complications related to urinary retention 08/02/2024 0602 by Nettie Norris, LPN Outcome: Progressing 08/02/2024 0505 by Nettie Norris, LPN Outcome: Progressing   Problem: Pain Managment: Goal: General experience of comfort will improve and/or be controlled 08/02/2024 0602 by Nettie Norris, LPN Outcome: Progressing 08/02/2024 0505 by Nettie Norris, LPN Outcome: Progressing   Problem: Safety: Goal: Ability to remain free from injury will improve 08/02/2024 0602 by Nettie Norris, LPN Outcome: Progressing 08/02/2024 0505 by Nettie Norris, LPN Outcome: Progressing   Problem: Skin Integrity: Goal: Risk for impaired skin integrity will decrease 08/02/2024 0602 by Nettie Norris, LPN Outcome: Progressing 08/02/2024 0505 by Nettie Norris, LPN Outcome: Progressing

## 2024-08-02 NOTE — Progress Notes (Signed)
 Occupational Therapy Treatment Patient Details Name: Shannon Chung MRN: [id number] DOB: 1941/10/08 Today's Date: 08/02/2024   History of present illness Pt is an 83 y.o. male presenting with word finding and balance difficulties. 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 Pt requires MOD A for squat pivot from recliner > BSC, impulsive and does not wait for RW to be placed before attempting transfer. MAX A to doff pants over hips with poor static standing balance requiring BUE support. After unsuccessful BM on BSC, RW used for STS transfer, improved to MIN A with AD use. Pt continues to demo poor carryover of transfer techniques and presents with wide BOS (RLE constantly outside RW base, question proprioceptive awareness of R side), and pulling on RW despite max multimodal cuing. Pt returned to seated in recliner, left with needs in reach. OT will continue to follow, discharge recommendation appropriate.       If plan is discharge home, recommend the following:  A lot of help with walking and/or transfers;A lot of help with bathing/dressing/bathroom;Direct supervision/assist for medications management;Direct supervision/assist for financial management;Assist for transportation;Supervision due to cognitive status   Equipment Recommendations  None recommended by OT    Recommendations for Other Services Rehab consult    Precautions / Restrictions Precautions Precautions: Fall Recall of Precautions/Restrictions: Intact Restrictions Weight Bearing Restrictions Per Provider Order: No       Mobility Bed Mobility Overal bed mobility: Needs Assistance             General bed mobility comments: NT. Pt recieved and left  in recliner    Transfers Overall transfer level: Needs assistance Equipment used: Rolling walker (2 wheels), None Transfers: Sit to/from Stand, Bed to chair/wheelchair/BSC Sit to Stand: Mod assist   Squat pivot transfers: Mod assist Step pivot transfers: Min assist     General transfer comment: pt with wide BOS (question proprioceptive awareness of RLE), requires cues to correct, stands with posterior bias and MIN A for upright posture with cues for anterior hip translation. max cues for hand placement with poor carryover, will continue to require education and reinforcement     Balance Overall balance assessment: Needs assistance Sitting-balance support: Feet supported Sitting balance-Leahy Scale: Good   Postural control: Posterior lean Standing balance support: Bilateral upper extremity supported, During functional activity, Reliant on assistive device for balance Standing balance-Leahy Scale: Poor Standing balance comment: Heavy reliance on RW for balance.                           ADL either performed or assessed with clinical judgement   ADL Overall ADL's : Needs assistance/impaired                         Toilet Transfer: Moderate assistance;Squat-pivot;BSC/3in1 Toilet Transfer Details (indicate cue type and reason): squat pivot recliner > BSC, MOD A for safety/cues for hand placement, difficulties advancing RLE Toileting- Clothing Manipulation and Hygiene: Maximal assistance Toileting - Clothing Manipulation Details (indicate cue type and reason): MAX A to manage pants / underwear to doff over hips due to BM urgency. requires BUE support for static standing     Functional mobility during ADLs: Moderate assistance General ADL Comments:  OT back into room due to BM urgency, squat pivot to BSC from recliner, SPT using RW back to recliner    Extremity/Trunk Assessment Upper Extremity Assessment Upper Extremity Assessment: RUE deficits/detail RUE  Deficits / Details: 3-/5 shoulder flexion, 3/5 shoulder abduction, 3/5 grip strength, 4-/5 elbow flexion, 4-/5 elbow extension. Slowed RAMPs on R compared to L RUE Coordination: decreased gross motor   Lower Extremity Assessment Lower Extremity Assessment: RLE deficits/detail;Generalized weakness RLE Coordination: decreased gross motor                 Communication Communication Communication: Impaired Factors Affecting Communication: Difficulty expressing self;Reduced clarity of speech   Cognition Arousal: Alert Behavior During Therapy: WFL for tasks assessed/performed Cognition: Cognition impaired     Awareness: Intellectual awareness impaired   Attention impairment (select first level of impairment): Sustained attention Executive functioning impairment (select all impairments): Problem solving, Reasoning                   Following commands: Intact        Cueing   Cueing Techniques: Verbal cues, Visual cues  Exercises Exercises: Other exercises Other Exercises Other Exercises: discussed role and purpose of OT, edu on neuroplasticity and stroke recovery timeline with recommendation for use of dominant RUE during functional task performance. pt and fiance verbalize understanding Other Exercises: provided pt with functional grasp and release task given ADL objects (deodorant, soap, tissues), edu on importance of repetitive use for functional gains with opening/closing containers. Other Exercises: given that patient drives at baseline, discussed need for clearance from MD prior to returning to driving            Pertinent Vitals/ Pain       Pain Assessment Pain Assessment: No/denies pain  Home Living Family/patient expects to be discharged to:: Private residence Living Arrangements: Spouse/significant other Available Help at Discharge: Available 24 hours/day Type of Home: House Home Access: Level entry     Home Layout: One level     Bathroom Shower/Tub:  Producer, television/film/video: Standard     Home Equipment: Agricultural consultant (2 wheels);Cane - single point              Frequency  Min 3X/week        Progress Toward Goals  OT Goals(current goals can now be found in the care plan section)  Progress towards OT goals: Progressing toward goals  Acute Rehab OT Goals OT Goal Formulation: With patient/family Time For Goal Achievement: 08/16/24 Potential to Achieve Goals: Good ADL Goals Pt Will Perform Grooming: standing;with supervision Pt Will Perform Upper Body Dressing: sitting;with modified independence Pt Will Perform Lower Body Dressing: with modified independence;sit to/from stand Pt Will Transfer to Toilet: with modified independence;ambulating Pt Will Perform Toileting - Clothing Manipulation and hygiene: with modified independence;sitting/lateral leans;sit to/from stand Pt/caregiver will Perform Home Exercise Program: Both right and left upper extremity;With written HEP provided;Increased strength;Increased ROM  Plan         AM-PAC OT 6 Clicks Daily Activity     Outcome Measure   Help from another person eating meals?: A Little Help from another person taking care of personal grooming?: A Little Help from another person toileting, which includes using toliet, bedpan, or urinal?: A Lot Help from another person bathing (including washing, rinsing, drying)?: A Lot Help from another person to put on and taking off regular upper body clothing?: A Little Help from another person to put on and taking off regular lower body clothing?:  A Lot 6 Click Score: 15    End of Session Equipment Utilized During Treatment: Rolling walker (2 wheels)  OT Visit Diagnosis: Other symptoms and signs involving the nervous system (R29.898);Other abnormalities of gait and mobility (R26.89);Unsteadiness on feet (R26.81);Muscle weakness (generalized) (M62.81);Cognitive communication deficit (R41.841) Symptoms and signs involving  cognitive functions: Cerebral infarction   Activity Tolerance Patient tolerated treatment well   Patient Left in chair;with call bell/phone within reach;with chair alarm set;with family/visitor present   Nurse Communication Mobility status        Time: 8961-8948 OT Time Calculation (min): 13 min  Charges: OT General Charges $OT Visit: 1 Visit OT Treatments $Self Care/Home Management : 8-22 mins  Trei Schoch L. Lilya Ewan, OTR/L  08/02/24, 4:13 PM

## 2024-08-02 NOTE — Evaluation (Signed)
 Speech Language Pathology Evaluation Patient Details Name: Shannon Chung MRN: [id number] DOB: 1941/04/27 Today's Date: 08/02/2024 Time: 1050-1200 SLP Time Calculation (min) (ACUTE ONLY): 70 min  Problem List:  Patient Active Problem List   Diagnosis Date Noted   Stroke-like symptoms 08/01/2024   Status post placement of implantable loop recorder 07/12/2024   Seizure disorder (HCC) 06/28/2024   Annual physical exam 07/10/2023   TIA (transient ischemic attack) 05/02/2023   GERD without esophagitis 05/02/2023   Carotid stenosis, symptomatic, with infarction (HCC) 04/26/2023   History of CVA (cerebrovascular accident) without residual deficits 03/04/2023   Acute CVA (cerebrovascular accident) (HCC) 02/22/2023   Chronic radicular pain of lower back 08/10/2022   Cervical spondylosis 03/11/2022   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   Bilateral carotid artery stenosis 03/11/2022   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   Lumbar spondylosis 10/07/2021   DDD (degenerative disc disease), lumbar 10/07/2021   History of radiation therapy 08/26/2021   Aortic atherosclerosis 04/17/2021   Diverticulosis 04/17/2021   Hypertension associated with diabetes (HCC) 10/07/2020   Overweight (BMI 25.0-29.9) 10/07/2020   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   Vitamin D  deficiency 08/01/2020   Polyp of sigmoid colon 08/01/2020   Insomnia 06/07/2020   Degenerative joint disease of hand 03/01/2020   BPH (benign prostatic hyperplasia) 08/05/2018   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   Macular scar of both eyes 04/20/2018   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   Scotoma involving central area of both eyes 04/20/2018   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   Fatty liver 03/25/2018   Erectile dysfunction 02/23/2017   Hx of CABG 01/24/2017   Advanced care planning/counseling discussion 10/21/2015   Coronary artery disease of native artery of native heart with stable angina pectoris    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   Dyslipidemia 01/02/2015   Osteoarthritis 01/02/2015   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 06/2015 Cardiac CT: Ca score 1103 (84th %'ile);  b. 07/2015 Cath: LM 70, LAD 80p, 100/75m, D1 70, D2 95, RI 75, RCA 100p/m;  c. 07/2015 CABG x 5 (LIMA->LAD, VG->Diag, VG->OM1->OM2, VG->OM3).   COVID-19    12/2021   COVID-19 01/18/2022   COVID-19 vaccine administered 01/18/2022   Unknown how many vaccine doses have been received. Entered from Emergency Triage Note.   Diabetes mellitus without complication (HCC) 07/2015   Dyslipidemia    Essential hypertension    Essential hypertension 01/02/2015   Formatting of this note might be different from the original.  Last Assessment & Plan:   Chronic, stable. Continue current regimen.   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 dilated, EF 35% improved to 45-50% (2015);  b. 07/2015 EF 25-35% by LV gram.   Ischemic cardiomyopathy 01/02/2015   Kidney stones 04/17/2021   Lone atrial fibrillation (HCC) 1983   a. isolated episode, not on OAC.   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 06/2014: LV; resolved with coumadin-->no residual on f/u echo, no longer on coumadin.   Mural thrombus of heart 08/26/2021   Formatting of this note might be different from the original. Jun 16, 2019 Entered By: VINIE ALLEAN AQUAS Comment: left ventricle, cardiac apex   Osteoarthritis    a. 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 Jun 16, 2019 Entered By: VINIE ALLEAN AQUAS Comment: leftAug 19, 2020 Entered By: VINIE ALLEAN AQUAS Comment: resolved by total left knee replacement Jun 16, 2019 Entered By: VINIE ALLEAN AQUAS Comment: leftAug 19, 2020 Entered By: VINIE ALLEAN AQUAS Comment: resolved by total left  knee replacement   Trigger finger of left hand 07/07/2019   Vitamin D  deficiency    Past Surgical History:  Past Surgical History:  Procedure Laterality Date   BICEPS TENDON REPAIR Right 1993   CARDIAC CATHETERIZATION N/A 07/05/2015   Procedure: Left Heart Cath and Coronary Angiography;  Surgeon: Evalene JINNY Lunger, MD;  Location: [facility] INVASIVE CV LAB;  Service: Cardiovascular;  Laterality: N/A;   CAROTID PTA/STENT INTERVENTION Left 04/26/2023   Procedure: CAROTID PTA/STENT INTERVENTION;  Surgeon: Marea Selinda RAMAN, MD;  Location: [facility] INVASIVE CV LAB;  Service: Cardiovascular;  Laterality: Left;   CATARACT EXTRACTION Left 12/2016   with keratoplasty   COLONOSCOPY  2007   COLONOSCOPY WITH PROPOFOL  N/A 12/02/2017   TA, SSA, rpt 3 yrs(Tahiliani, Varnita B, MD)   COLONOSCOPY WITH PROPOFOL  N/A 11/26/2020   Procedure: COLONOSCOPY WITH PROPOFOL ;  Surgeon: Jinny Carmine, MD;  Location: [facility] ENDOSCOPY;  Service: Endoscopy;  Laterality: N/A;   CORONARY ARTERY BYPASS GRAFT N/A 07/29/2015   Procedure: CORONARY ARTERY BYPASS GRAFTING (CABG) x 5 (LIMA to LAD, SVG to DIAGONAL,  SVG SEQUENTIALLY to OM1 and OM2, SVG to OM3) with Endoscopic Vein Havesting of  GREATER SAPHENOUS VEIN from RIGHT THIGH and partial LOWER LEG ;  Surgeon: Dorise MARLA Fellers, MD;  Location: [facility] OR;  Service: Open Heart Surgery;  Laterality: N/A;   EYE SURGERY     b/l cataract and cornea replaced    HAND SURGERY     left hand 1st/2nd trigger fingers Dr. Cleotilde ortho    JOINT REPLACEMENT     KNEE ARTHROSCOPY Left remote   MOHS SURGERY     left cheek scc 2022 Dr. Lloyd   MOHS SURGERY     x 5 facial scc   right biceps tendon     repair/re attachment    SKIN CANCER EXCISION  10/2015   BCC - L ala (pending MOHs) and L scapula (complete excision)   TEE WITHOUT CARDIOVERSION N/A 07/29/2015   Procedure: TRANSESOPHAGEAL ECHOCARDIOGRAM (TEE);  Surgeon: Dorise MARLA Fellers, MD;  Location: [facility] OR;  Service: Open Heart Surgery;  Laterality: N/A;    TONSILLECTOMY  1949   TOTAL KNEE ARTHROPLASTY Left 03/18/2016   cemented L TKR; Kayla Cleotilde, MD   HPI:  Pt is a 83 y.o. male and per chart notes with Multiple medical history significant of left carotid artery stent, CAD s/p CABGX5, dyslipidemia, CVA 2024, hypertension, GERD, hx of stereotypic spells of speech disturbance with EEG showing left hemispheric cortical dysfunction and got started empirically on Keppra , who presented for speech related disturbances.      Pt started having difficulty speaking around 2 pm.  Last known well was 11 AM noted by wife who then left the house.  When wife returned, she found pt having speech difficulty.  While getting dressed to go to the hospital, wife also noted more balance issues with pt and pt c/o RUE/hand weakness/numbness in grip.  Pt did not remember if he took his home Keppra  this morning.  Pt had recurrent presentations with word-finding difficulty in June 2024 after his left carotid stenting on 04/26/23.  EEG showed some cortical dysfunction left temporal region and therefore patient was started empirically on levetiracetam  500 mg twice a day.SABRA   MRI this admit: Generalized age-related cerebral atrophy. Patchy T2/FLAIR  hyperintensity involving the supratentorial cerebral white matter,  consistent with chronic small vessel ischemic disease, mild in  nature. Few small remote lacunar infarcts present about the left  greater than right basal ganglia. Encephalomalacia gliosis at the  posterior left frontoparietal region near the vertex, consistent  with prior infarcts.     Scattered patchy small volume foci of restricted diffusion seen  involving the cortical subcortical left frontal, parietal, and  temporal lobes, left MCA distribution, consistent with acute  ischemic infarcts. These are superimposed on the underlying chronic  left frontoparietal infarct. These are somewhat watershed in  distribution. Patchy involvement of the basal ganglia noted as well.  No  associated hemorrhage or mass effect..   CXR: negative.   Assessment / Plan / Recommendation Clinical Impression   Pt was seen for informal speech/language evaluation using components of the Western Aphasic Battery(WAB), and a few, informal Cognitive screening tasks. Pt was alert, pleasant, and cooperative; often smiling and chuckling w/ an interruption or as he addressed a task(compensatory?). Significant other present during session.  Pt and family noted the expressive language deficits at onset and compared them to an occurrence previously - stated we should have done therapy then. Both are eager to address current deficits/needs w/ Inpatient Rehab in order to achieve the best recovery from this possible w/ the goal to return home safely.  Pt and family noted that pt's speech and expressive language has improved since onset; pt endorsed slight frustration when unable to get his words out the way I want to but my speech is better today.  On RA, afebrile. WBC WNL. Pt sitting in chair; requires support and walker for mobility.   Evaluation completed via informal means and portions of Western Aphasia Battery Revised (Bedside Record Form). Pt presents w/ speech/language impairment most c/w anomic Aphasia. Pt's expressive speech is c/b inconsistent anomia w/ intermittent hesitations and paraphasias of speech. Noted his repair of breakdowns of both phonemic and semantic paraphasias noted. He was aware of most errors but did not recognize error in giving the numerals of his address. Min inconsistent impaired responsive naming for nouns, object function, and convergent/divergent naming; pt stimulable w/ verbal cues. Phonemic and semantic errors occurred more frequently w/ unknown topic of conversation; w/ a visual prompt (pictured task), less errors occurred. Noted error in sentence length Repetition task.  During tasks of auditory comprehension for basic information including yes/no questions, 2-step  commands, and object grouping/function, completion of tasks was accurate and WFL.  Pt exhibited WFL temporal and situational orientation as well as WFL verbal-problem solving during pictured task -- pt recognized need to call 911 for an Emergency situation. Other Cognitive-linguistic tasks of Fluency and recall of 5 objects were mildly impaired; accuracy improved given verbal cue. Suspect his performance could be similar to that of the SLUMS results in 2024.  Speech intelligibility was functional; no Motor Speech deficits noted. No overt OM weakness nor Dysarthria noted.    Recommend ongoing ST therapy targeting functional communication for pt's home environment in a post-acute setting. Recommend ongoing assessment(more formal) of pt's strengths/deficits in all cognitive-linguistic areas. Recommend Supervision at D/C for safety.    Pt and family made aware of results of assessment, changes to functional communication following stroke, communication strategies, and SLP POC recommendation. Pt/family verbalized understanding; MD/NSG/TOC made aware of results of assessment, recommendations, communication strategies, and SLP POC for f/u at next venue of care.     SLP Assessment  SLP Recommendation/Assessment: All further Speech Language Pathology needs can be addressed in the next venue of care SLP Visit Diagnosis: Aphasia (R47.01)     Assistance Recommended at Discharge  Set up Supervision/Assistance  Functional Status Assessment Patient has had a recent decline in their functional status and demonstrates the ability to make significant improvements in function in a reasonable and predictable amount of time.  Frequency and Duration  (tbd)   (tbd)      SLP Evaluation Cognition  Overall Cognitive Status: History of cognitive impairments - at baseline (SLUMS in 2024: 21/30 reported then) Arousal/Alertness: Awake/alert Orientation Level: Oriented X4 Year: 2025 Month: October Attention:  Focused;Sustained [facility]) Focused Attention: Appears intact Sustained Attention: Appears intact (for verbal tasks) Memory: Impaired Memory Impairment: Retrieval deficit Awareness: Appears intact (pictured event) Problem Solving: Appears intact (pictured event) Executive Function: Reasoning;Decision Making (pictured event) Reasoning: Appears intact Decision Making: Appears intact Behaviors:  (min frustration w/ his language deficits; min chuckling during interruptions) Safety/Judgment:  (pictured event; 911) Comments: verbal cues aided in securing more details       Comprehension  Auditory Comprehension Overall Auditory Comprehension: Appears within functional limits for tasks assessed Yes/No Questions: Within Functional Limits Commands: Within Functional Limits (2-step) Conversation: Simple Other Conversation Comments: noted casual conversation was easiest; confrontational or new topcis given more challenging expressively Interfering Components:  (adequate) EffectiveTechniques:  (n/a) Visual Recognition/Discrimination Discrimination: Not tested Reading Comprehension  Reading Status: Not tested    Expression Expression Primary Mode of Expression: Verbal Verbal Expression Overall Verbal Expression: Impaired Initiation: No impairment Automatic Speech: Name;Social Response;Counting;Day of week;Month of year (WFL) Level of Generative/Spontaneous Verbalization: Conversation Repetition: Impaired Level of Impairment: Sentence level Naming: Impairment (paraphasia noted) Responsive: 76-100% accurate Confrontation: Impaired Convergent: 75-100% accurate Divergent: 75-100% accurate Verbal Errors: Phonemic paraphasias;Semantic paraphasias;Aware of errors Pragmatics: No impairment (min chuckling) Interfering Components:  (n/a) Effective Techniques: Semantic cues Non-Verbal Means of Communication: Not applicable Written Expression Dominant Hand: Right (c/o grip issues in  hand) Written Expression: Not tested   Oral / Motor  Oral Motor/Sensory Function Overall Oral Motor/Sensory Function: Within functional limits Motor Speech Overall Motor Speech: Appears within functional limits for tasks assessed Respiration: Within functional limits Phonation: Normal Resonance: Within functional limits Articulation: Within functional limitis Intelligibility: Intelligible Motor Planning: Within functional limits Motor Speech Errors: Not applicable             Comer Portugal, MS, CCC-SLP Speech Language Pathologist Rehab Services; [facility] [place] [phone] (ascom) Kenedy Haisley 08/02/2024, 5:10 PM

## 2024-08-02 NOTE — Progress Notes (Signed)
 NEUROLOGY CONSULT FOLLOW UP NOTE   Date of service: August 02, 2024 Patient Name: Shannon Chung MRN:  [id number] DOB:  04-22-1941  Interval Hx/subjective   Seen and examined Mild headache this AM MRI shows acute  left hemispheric strokes, watershed pattern  Wife at bedside reports concern for dementia  Vitals   Vitals:   08/01/24 2013 08/01/24 2336 08/02/24 0320 08/02/24 0750  BP: 123/84 104/69 113/67 122/77  Pulse: (!) 58 72 68 (!) 105  Resp: 18   18  Temp: 97.9 F (36.6 C) (!) 97.4 F (36.3 C) (!) 97.5 F (36.4 C) 97.8 F (36.6 C)  TempSrc:  Oral Oral Oral  SpO2: 95% 96% 93% 96%  Weight: 86.7 kg     Height: 6' 1 (1.854 m)        Body mass index is 25.22 kg/m.  Physical Exam   Constitutional: Appears well-developed and well-nourished.  Psych: Affect appropriate to situation.  Eyes: No scleral injection.  HENT: No OP obstruction.  Head: Normocephalic.  Cardiovascular: Normal rate and regular rhythm.  Respiratory: Effort normal, non-labored breathing.  GI: Soft.  No distension. There is no tenderness.  Skin: WDI.  Neurologic Examination  Awake alert oriented x 3 No dysarthria Mild aphasia-has trouble with long sentences where he develops a word salad Cranial nerves II to XII intact Motor or sensory and coordination exam unremarkable for abnormality  Unchanged from yesterday  Medications  Current Facility-Administered Medications:     stroke: early stages of recovery book, , Does not apply, Once, Awanda [place], MD   aspirin  EC tablet 162 mg, 162 mg, Oral, Daily, Awanda [place], MD, 162 mg at 08/01/24 1843   atorvastatin  (LIPITOR ) tablet 40 mg, 40 mg, Oral, Daily, Awanda [place], MD, 40 mg at 08/01/24 1842   clopidogrel  (PLAVIX ) tablet 75 mg, 75 mg, Oral, Daily, Awanda [place], MD, 75 mg at 08/01/24 1842   enoxaparin  (LOVENOX ) injection 40 mg, 40 mg, Subcutaneous, Q24H, Awanda [place], MD, 40 mg at 08/01/24 2203   ezetimibe  (ZETIA ) tablet 10 mg, 10 mg, Oral, Daily, Awanda [place], MD, 10 mg at 08/01/24 1842   levETIRAcetam  (KEPPRA ) tablet 750 mg, 750 mg, Oral, BID, Awanda [place], MD, 750 mg at 08/01/24 2248  Labs and Diagnostic Imaging   CBC:  Recent Labs  Lab 08/01/24 1528  WBC 7.8  NEUTROABS 5.0  HGB 14.2  HCT 42.3  MCV 97.2  PLT 178    Basic Metabolic Panel:  Lab Results  Component Value Date   NA 139 08/02/2024   K 3.9 08/02/2024   CO2 25 08/02/2024   GLUCOSE 108 (H) 08/02/2024   BUN 15 08/02/2024   CREATININE 0.97 08/02/2024   CALCIUM  9.0 08/02/2024   GFRNONAA >60 08/02/2024   GFRAA >60 11/29/2017   Lipid Panel:  Lab Results  Component Value Date   LDLCALC 45 08/02/2024   HgbA1c:  Lab Results  Component Value Date   HGBA1C 6.6 (H) 06/28/2024   Urine Drug Screen:     Component Value Date/Time   LABOPIA NONE DETECTED 08/01/2024 2340   COCAINSCRNUR NONE DETECTED 08/01/2024 2340   LABBENZ NONE DETECTED 08/01/2024 2340   AMPHETMU NONE DETECTED 08/01/2024 2340   THCU NONE DETECTED 08/01/2024 2340   LABBARB NONE DETECTED 08/01/2024 2340    Alcohol Level     Component Value Date/Time   [facility] <15 08/01/2024 1528   INR  Lab Results  Component Value Date   INR 1.0 08/01/2024   APTT  Lab Results  Component Value Date   APTT 28 08/01/2024   MRI Brain(Personally reviewed): IMPRESSION: 1. Scattered patchy small volume acute ischemic nonhemorrhagic left MCA distribution infarcts as above. No associated mass effect. 2. Underlying chronic left frontoparietal infarct, with additional small remote lacunar infarcts about the left greater than right basal ganglia. 3. Underlying age-related cerebral atrophy with mild chronic small vessel ischemic disease.  rEEG:  Pending  Assessment   Shannon Chung is a 83 y.o. male past history of carotid stenosis status post left carotid stent placement, with prior left hemispheric strokes and multiple presentations with speech difficulties with negative MRI, now presenting with intermittent  aphasia. 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  Impression: Acute ischemic stroke, likely due to large vessel disease  Recommendations  Frequent neurochecks Telemetry 2D echo A1c and LDL at goal Continue home aspirin , statin and Plavix . I would continue her Keppra  at home dose of 500 mg twice daily. I do not think he needs an EEG at this time-his aphasia is explained by the strokes. Therapy assessments Outpatient neuropsychological evaluation for concern for dementia Appreciate vascular surgery looking into left carotid stent patency and further recommendations as needed. Outpatient neurology follow-up in 8 to 12 weeks Will follow-up the echo with you Plan discussed with Dr. Dezii ______________________________________________________________________   Signed, Eligio Lav, MD [facility]

## 2024-08-02 NOTE — Plan of Care (Signed)
  Problem: Education: Goal: Knowledge of disease or condition will improve Outcome: Progressing   Problem: Education: Goal: Knowledge of secondary prevention will improve (MUST DOCUMENT ALL) Outcome: Progressing   Problem: Education: Goal: Knowledge of patient specific risk factors will improve (DELETE if not current risk factor) Outcome: Progressing   Problem: Ischemic Stroke/TIA Tissue Perfusion: Goal: Complications of ischemic stroke/TIA will be minimized Outcome: Progressing   Problem: Education: Goal: Knowledge of General Education information will improve Description: Including pain rating scale, medication(s)/side effects and non-pharmacologic comfort measures Outcome: Progressing   Problem: Health Behavior/Discharge Planning: Goal: Ability to manage health-related needs will improve Outcome: Progressing   Problem: Activity: Goal: Risk for activity intolerance will decrease Outcome: Progressing   Problem: Pain Managment: Goal: General experience of comfort will improve and/or be controlled Outcome: Progressing

## 2024-08-03 DIAGNOSIS — R2990 Unspecified symptoms and signs involving the nervous system: Secondary | ICD-10-CM | POA: Diagnosis not present

## 2024-08-03 MED ORDER — FAMOTIDINE 20 MG PO TABS
20.0000 mg | ORAL_TABLET | Freq: Two times a day (BID) | ORAL | Status: DC | PRN
  Administered 2024-08-03: 20 mg via ORAL
  Filled 2024-08-03: qty 1

## 2024-08-03 NOTE — PMR Pre-admission (Shared)
 PMR Admission Coordinator Pre-Admission Assessment  Patient: Shannon Chung is an 83 y.o., male MRN: [id number] DOB: [birth year] Height: 6' 1 (185.4 cm) Weight: 86.7 kg  Insurance Information HMO: ***    PPO: ***     PCP:      IPA:      80/20:      OTHER:  PRIMARY: U[employer] Medicare      Policy#: ***      Subscriber: pt CM Name: ***      Phone#: [phone]     Fax#: [phone] Pre-Cert#: ***      Employer:  Benefits:  Phone #: [phone]     Name:  Eff. Date: ***     Deduct:       Out of Pocket Max: ***      Life Max:  CIR: ***      SNF: *** Outpatient: ***     Co-Pay: *** Home Health: ***      Co-Pay: *** DME: ***     Co-Pay: *** Providers:  SECONDARY:       Policy#:      Phone#:   Financial Counselor:       Phone#:   The "Data Collection Information Summary" for patients in Inpatient Rehabilitation Facilities with attached "Privacy Act Statement-Health Care Records" was provided and verbally reviewed with: Patient and Family  Emergency Contact Information Contact Information     Name Relation Home Work Mobile   [place] A Friend [phone]  [phone]      Other Contacts   None on File     Current Medical History  Patient Admitting Diagnosis: CVA   History of Present Illness: Pt is an 83 y/o male with PMH of CAD s/p CABG x5, HTN, 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  In ED, pt hemodynamically stable.  NIHSS 1 for language.  CT and CTA imaging negative.  MRI showed acute left hemispheric strokes in a watershed pattern.  Recommendations for aspirin  and Plavix .  Echo showed preserved EF with new regional WMA in LV.  Bubble negative.  Cardiology consulted and reviewed echo and felt WMA was present on 2024 echo and recommended outpatient followup.  Therapy ongoing and pt was recommended for CIR.   Complete NIHSS TOTAL: 0  Patient's medical record from [facility] has been reviewed by the  rehabilitation admission coordinator and physician.  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 06/2015 Cardiac CT: Ca score 1103 (84th %'ile);  b. 07/2015 Cath: LM 70, LAD 80p, 100/96m, D1 70, D2 95, RI 75, RCA 100p/m;  c. 07/2015 CABG x 5 (LIMA->LAD, VG->Diag, VG->OM1->OM2, VG->OM3).   COVID-19    12/2021   COVID-19 01/18/2022   COVID-19 vaccine administered 01/18/2022   Unknown how many vaccine doses have been received. Entered from Emergency Triage Note.   Diabetes mellitus without complication (HCC) 07/2015   Dyslipidemia    Essential hypertension    Essential hypertension 01/02/2015   Formatting  of this note might be different from the original.  Last Assessment & Plan:   Chronic, stable. Continue current regimen.   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 dilated, EF 35% improved to 45-50% (2015);  b. 07/2015 EF 25-35% by LV gram.   Ischemic cardiomyopathy 01/02/2015   Kidney stones 04/17/2021   Lone atrial fibrillation (HCC) 1983   a. isolated episode, not on OAC.   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 06/2014: LV; resolved with coumadin-->no residual on f/u echo, no longer on coumadin.   Mural thrombus of heart 08/26/2021   Formatting of this note might be different from the original. Jun 16, 2019 Entered By: VINIE ALLEAN AQUAS Comment: left ventricle, cardiac apex   Osteoarthritis    a. 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 Jun 16, 2019 Entered By: VINIE ALLEAN AQUAS Comment: leftAug 19, 2020 Entered By: VINIE ALLEAN AQUAS Comment: resolved by total left knee replacement Jun 16, 2019 Entered By: VINIE ALLEAN AQUAS Comment: leftAug 19, 2020 Entered By: VINIE ALLEAN AQUAS Comment: resolved by total left knee replacement   Trigger finger of left hand 07/07/2019   Vitamin D  deficiency     Has the patient had major surgery during 100 days prior to  admission? No  Family History   family history includes Alcohol abuse in his father; Alcoholism in his father; CAD (age of onset: 45) in his father; Cancer in his daughter; Diabetes in his father; Heart disease in his father; Hyperlipidemia in his father; Hypertension in his father.  Current Medications  Current Facility-Administered Medications:    aspirin  EC tablet 162 mg, 162 mg, Oral, Daily, Awanda [place], MD, 162 mg at 08/03/24 0859   atorvastatin  (LIPITOR ) tablet 40 mg, 40 mg, Oral, Daily, Awanda,  Ellouise, MD, 40 mg at 08/03/24 0859   clopidogrel  (PLAVIX ) tablet 75 mg, 75 mg, Oral, Daily, Awanda Ellouise, MD, 75 mg at 08/03/24 9141   enoxaparin  (LOVENOX ) injection 40 mg, 40 mg, Subcutaneous, Q24H, Awanda Ellouise, MD, 40 mg at 08/02/24 2235   ezetimibe  (ZETIA ) tablet 10 mg, 10 mg, Oral, Daily, Awanda Ellouise, MD, 10 mg at 08/03/24 9140   famotidine  (PEPCID ) tablet 20 mg, 20 mg, Oral, BID PRN, Dezii, Alexandra, DO, 20 mg at 08/03/24 1036   levETIRAcetam  (KEPPRA ) tablet 500 mg, 500 mg, Oral, BID, Arora, Ashish, MD, 500 mg at 08/03/24 9141  Patients Current Diet:  Diet Order             Diet Heart Room service appropriate? Yes; Fluid consistency: Thin  Diet effective now                   Precautions / Restrictions Precautions Precautions: Fall Restrictions Weight Bearing Restrictions Per Provider Order: No   Has the patient had 2 or more falls or a fall with injury in the past year? No  Prior Activity Level Community (5-7x/wk): independent without DME, driving, volunteers at [facility], home with s/o  Prior Functional Level Self Care: Did the patient need help bathing, dressing, using the toilet or eating? Independent  Indoor Mobility: Did the patient need assistance with walking from room to room (with or without device)? Independent  Stairs: Did the patient need assistance with internal or external stairs (with or without device)? Independent  Functional Cognition: Did the patient need help  planning regular tasks such as shopping or remembering to take medications? Independent  Patient Information Are you of Hispanic, Latino/a,or Spanish origin?: A. No, not of Hispanic, Latino/a, or Spanish origin What is your race?: A. White Do you need or want an interpreter to communicate with a doctor or health care staff?: 0. No  Patient's Response To:  Health Literacy and Transportation Is the patient able to respond to health literacy and transportation needs?: Yes Health Literacy - How often do you need to have someone help you when you read instructions, pamphlets, or other written material from your doctor or pharmacy?: Never In the past 12 months, has lack of transportation kept you from medical appointments or from getting medications?: No In the past 12 months, has lack of transportation kept you from meetings, work, or from getting things needed for daily living?: No  Home Assistive Devices / Equipment Home Equipment: Agricultural consultant (2 wheels), [employer] - single point  Prior Device Use: Indicate devices/aids used by the patient prior to current illness, exacerbation or injury? None of the above  Current Functional Level Cognition  Arousal/Alertness: Awake/alert Overall Cognitive Status: History of cognitive impairments - at baseline (SLUMS in 2024: 21/30 reported then) Orientation Level: Oriented X4 Attention: Focused, Sustained [facility]) Focused Attention: Appears intact Sustained Attention: Appears intact (for verbal tasks) Memory: Impaired Memory Impairment: Retrieval deficit Awareness: Appears intact (pictured event) Problem Solving: Appears intact (pictured event) Executive Function: Reasoning, Decision Making (pictured event) Reasoning: Appears intact Decision Making: Appears intact Behaviors:  (min frustration w/ his language deficits; min chuckling during interruptions) Safety/Judgment:  (pictured event; 911) Comments: verbal cues aided in securing more details     Extremity Assessment (includes Sensation/Coordination)  Upper Extremity Assessment: RUE deficits/detail RUE Deficits / Details: 3-/5 shoulder flexion, 3/5 shoulder abduction, 3/5 grip strength, 4-/5 elbow flexion, 4-/5 elbow extension. Slowed RAMPs on R compared to L RUE Coordination: decreased gross motor  Lower Extremity Assessment: RLE  deficits/detail, Generalized weakness RLE Coordination: decreased gross motor    ADLs  Overall ADL's : Needs assistance/impaired Lower Body Dressing: Minimal assistance, Sit to/from stand Lower Body Dressing Details (indicate cue type and reason): seated figure four to don shoes, decreased RUE FMC to adjust shoe. MAX A to fasten pants with zipper and button in standing Toilet Transfer: Moderate assistance, Squat-pivot, BSC/3in1 Toilet Transfer Details (indicate cue type and reason): squat pivot recliner > BSC, MOD A for safety/cues for hand placement, difficulties advancing RLE Toileting- Clothing Manipulation and Hygiene: Maximal assistance Toileting - Clothing Manipulation Details (indicate cue type and reason): MAX A to manage pants / underwear to doff over hips due to BM urgency. requires BUE support for static standing Functional mobility during ADLs: Moderate assistance General ADL Comments: OT back into room due to BM urgency, squat pivot to BSC from recliner, SPT using RW back to recliner    Mobility  Overal bed mobility: Needs Assistance Bed Mobility: Supine to Sit Supine to sit: Contact guard Sit to supine: Contact guard assist General bed mobility comments: Able to assist himself out of bed with CGA for safety    Transfers  Overall transfer level: Needs assistance Equipment used: None Transfers: Sit to/from Stand, Bed to chair/wheelchair/BSC Sit to Stand: Mod assist Bed to/from chair/wheelchair/BSC transfer type:: Step pivot Squat pivot transfers: Mod assist Step pivot transfers: Min assist General transfer comment: Mod A for STS  without use of AD. Pt able to stand from lowest bed height. Min A for sequencing and preventing LOB when step pivoting into recliner.    Ambulation / Gait / Stairs / Wheelchair Mobility  Ambulation/Gait Ambulation/Gait assistance: Editor, commissioning (Feet): 80 Feet Assistive device: 1 person hand held assist Gait Pattern/deviations: Decreased dorsiflexion - right, Trunk flexed, Step-to pattern, Decreased dorsiflexion - left General Gait Details: min A to support balance. Pt demonstrated 1 LOB d/t dec dorsiflexion to which SPT provided min A for correction.    Posture / Balance Dynamic Sitting Balance Sitting balance - Comments: steady reaching outside BOS. able to don shoes after set up while sitting at EOB Balance Overall balance assessment: Needs assistance Sitting-balance support: Feet supported Sitting balance-Leahy Scale: Good Sitting balance - Comments: steady reaching outside BOS. able to don shoes after set up while sitting at EOB Postural control: Posterior lean Standing balance support: Single extremity supported, During functional activity Standing balance-Leahy Scale: Poor Standing balance comment: heavy reliance on HHA for stand. Able to stand without AD however with inc postural sway. Standardized Balance Assessment Standardized Balance Assessment : Berg Balance Test Berg Balance Test Sit to Stand: Able to stand  independently using hands Standing Unsupported: Able to stand 2 minutes with supervision Sitting with Back Unsupported but Feet Supported on Floor or Stool: Able to sit safely and securely 2 minutes Stand to Sit: Uses backs of legs against chair to control descent Transfers: Able to transfer safely, definite need of hands Standing Unsupported with Eyes Closed: Able to stand 10 seconds with supervision Standing Ubsupported with Feet Together: Able to place feet together independently and stand for 1 minute with supervision From Standing, Reach Forward with  Outstretched Arm: Can reach forward >12 cm safely (5) From Standing Position, Pick up Object from Floor: Unable to pick up shoe, but reaches 2-5 cm (1-2) from shoe and balances independently From Standing Position, Turn to Look Behind Over each Shoulder: Turn sideways only but maintains balance Turn 360 Degrees: Needs close supervision or verbal cueing Standing Unsupported, Alternately  Place Feet on Step/Stool: Able to complete >2 steps/needs minimal assist Standing Unsupported, One Foot in Front: Able to take small step independently and hold 30 seconds Standing on One Leg: Tries to lift leg/unable to hold 3 seconds but remains standing independently Total Score: 33    Special considerations/life events  Diabetic management yes   Previous Home Environment (from acute therapy documentation) Living Arrangements: Spouse/significant other  Lives With: Significant other Available Help at Discharge: Available 24 hours/day Type of Home: House Home Layout: One level Home Access: Level entry Bathroom Shower/Tub: Health visitor: Standard Home Care Services: No  Discharge Living Setting Plans for Discharge Living Setting: Patient's home, Lives with (comment) (s/o Georgi) Type of Home at Discharge: House Discharge Home Layout: One level Discharge Home Access: Level entry Discharge Bathroom Shower/Tub: Walk-in shower Discharge Bathroom Toilet: Standard Discharge Bathroom Accessibility: Yes How Accessible: Accessible via walker Does the patient have any problems obtaining your medications?: No  Social/Family/Support Systems Patient Roles: Partner Anticipated Caregiver: Mardel Reasoner Anticipated Caregiver's Contact Information: [phone] Ability/Limitations of Caregiver: supervision only, cannot help pt up if he falls Caregiver Availability: 24/7 Discharge Plan Discussed with Primary Caregiver: Yes Is Caregiver In Agreement with Plan?: Yes Does Caregiver/Family have  Issues with Lodging/Transportation while Pt is in Rehab?: No  Goals Patient/Family Goal for Rehab: PT/OT/SLP supervision Expected length of stay: 12-14 days Additional Information: Discharge plan: home with s/o who can provide supervision and some light assist for ADLs, but states she cannot help him up if he falls Pt/Family Agrees to Admission and willing to participate: Yes Program Orientation Provided & Reviewed with Pt/Caregiver Including Roles  & Responsibilities: Yes  Decrease burden of Care through IP rehab admission: na/  Possible need for SNF placement upon discharge:  Not anticipated.  Plan for discharge home with s/o who can provide 24/7 supervision.   Patient Condition: I have reviewed medical records from [facility], spoken with [facility] team, and patient and spouse. I discussed via phone for inpatient rehabilitation assessment.  Patient will benefit from ongoing PT, OT, and SLP, can actively participate in 3 hours of therapy a day 5 days of the week, and can make measurable gains during the admission.  Patient will also benefit from the coordinated team approach during an Inpatient Acute Rehabilitation admission.  The patient will receive intensive therapy as well as Rehabilitation physician, nursing, social worker, and care management interventions.  Due to safety, skin/wound care, disease management, medication administration, pain management, and patient education the patient requires 24 hour a day rehabilitation nursing.  The patient is currently min assist with mobility and up to max assist with basic ADLs.  Discharge setting and therapy post discharge at home with home health is anticipated.  Patient has agreed to participate in the Acute Inpatient Rehabilitation Program and will admit pending insurance approval ***.  Preadmission Screen Completed By:  Reche FORBES Lowers, PT, DPT 08/03/2024 3:18 PM ______________________________________________________________________   Discussed status with  Dr. PIERRETTE on *** at *** and received approval for admission today.  Admission Coordinator:  Denae Zulueta E Matthews Franks, PT, time PIERRETTEPattricia ***   Assessment/Plan: Diagnosis: *** Does the need for close, 24 hr/day Medical supervision in concert with the patient's rehab needs make it unreasonable for this patient to be served in a less intensive setting? {yes_no_potentially:3041433} Co-Morbidities requiring supervision/potential complications: *** Due to {due un:6958565}, does the patient require 24 hr/day rehab nursing? {yes_no_potentially:3041433} Does the patient require coordinated care of a physician, rehab nurse, PT, OT, and SLP to  address physical and functional deficits in the context of the above medical diagnosis(es)? {yes_no_potentially:3041433} Addressing deficits in the following areas: {deficits:3041436} Can the patient actively participate in an intensive therapy program of at least 3 hrs of therapy 5 days a week? {yes_no_potentially:3041433} The potential for patient to make measurable gains while on inpatient rehab is {potential:3041437} Anticipated functional outcomes upon discharge from inpatient rehab: {functional outcomes:304600100} PT, {functional outcomes:304600100} OT, {functional outcomes:304600100} SLP Estimated rehab length of stay to reach the above functional goals is: *** Anticipated discharge destination: {anticipated dc setting:21604} 10. Overall Rehab/Functional Prognosis: {potential:3041437}   MD Signature: ***

## 2024-08-03 NOTE — Progress Notes (Signed)
 Inpatient Rehab Coordinator Note:  I spoke with patient's s/o Mardel over the phone to discuss CIR recommendations and goals/expectations of CIR stay.  We reviewed 3 hrs/day of therapy, physician follow up, and average length of stay 2 weeks (dependent upon progress) with goals of supervision.  We reviewed that she is able to provide 24/7 supervision, but no physical assist.  I reviewed need for insurance prior auth, and I will start that process today.  We will follow.     Reche Lowers, PT, DPT Admissions Coordinator [phone] 08/03/24  3:03 PM

## 2024-08-03 NOTE — Plan of Care (Signed)
 2D echocardiogram completed and reviewed.  LVEF 55 to 60%.  Left ventricle has wall motion abnormalities.  Bubble study negative.  LA size normal.   Has cardiology care established.  Needs to follow-up with neurology and cardiology  Plan discussed with Dr. Leesa  --  Eligio Lav, MD Neurology

## 2024-08-03 NOTE — Progress Notes (Signed)
 Physical Therapy Treatment Patient Details Name: Shannon Chung MRN: [id number] DOB: 1941/05/12 Today's Date: 08/03/2024   History of Present Illness Pt is an 83 y.o. male presenting with word finding and balance difficulties. 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 Pt able to amb without AD, however does need min A using HHA to support balance/prevent LOB. Pt demonstrated 1 LOB 2/2 dec dorsiflexion on the right and required min A to correct. Pt does not use AD at baseline, so the BERG was scored without AD. Pt scores 33/56 without use of AD on the BERG demonstrating he is at increased fall risk.  He will benefit from use of RW while in his acute hospital stay to prevent falls. Pt demonstrates progress with activity tolerance/balance and anticipate he will continue to improve with >3 hours of high intensity skilled physical therapy.    If plan is discharge home, recommend the following: A little help with bathing/dressing/bathroom;Assistance with cooking/housework;Assist for transportation;Direct supervision/assist for medications management;Supervision due to cognitive status;A little help with walking and/or transfers   Can travel by private vehicle        Equipment Recommendations  Other (comment)    Recommendations for Other Services       Precautions / Restrictions Precautions Precautions: Fall Recall of Precautions/Restrictions: Intact Restrictions Weight Bearing Restrictions Per Provider Order: No     Mobility  Bed Mobility Overal bed mobility: Needs Assistance Bed Mobility: Supine to Sit     Supine to sit: Contact guard     General  bed mobility comments: Able to assist himself out of bed with CGA for safety    Transfers Overall transfer level: Needs assistance Equipment used: None Transfers: Sit to/from Stand, Bed to chair/wheelchair/BSC Sit to Stand: Mod assist   Step pivot transfers: Min assist       General transfer comment: Mod A for STS without use of AD. Pt able to stand from lowest bed height. Min A for sequencing and preventing LOB when step pivoting into recliner.    Ambulation/Gait Ambulation/Gait assistance: Min assist Gait Distance (Feet): 80 Feet Assistive device: 1 person hand held assist Gait Pattern/deviations: Decreased dorsiflexion - right, Trunk flexed, Step-to pattern, Decreased dorsiflexion - left       General Gait Details: min A to support balance. Pt demonstrated 1 LOB d/t dec dorsiflexion to which SPT provided min A for correction.   Stairs             Wheelchair Mobility     Tilt Bed    Modified Rankin (Stroke Patients Only)       Balance Overall balance assessment: Needs assistance Sitting-balance support: Feet supported Sitting balance-Leahy Scale: Good Sitting balance - Comments: steady reaching outside BOS. able to don shoes after set up while sitting at EOB   Standing balance support: Single extremity supported, During functional activity Standing balance-Leahy Scale: Poor Standing balance comment: heavy reliance on HHA for stand. Able to stand without AD however with inc postural sway.                 Standardized Balance Assessment Standardized Balance Assessment : Berg Balance Test Berg Balance Test Sit to Stand: Able to stand  independently using hands Standing Unsupported: Able to stand 2 minutes with supervision Sitting with Back Unsupported but Feet Supported on Floor or Stool: Able to sit safely and securely 2 minutes Stand to Sit: Uses backs of legs against chair to control descent Transfers: Able to transfer safely, definite need of  hands Standing Unsupported with Eyes Closed: Able to stand 10 seconds with supervision Standing Ubsupported with Feet Together: Able to place feet together independently and stand for 1 minute with supervision From Standing, Reach Forward with Outstretched Arm: Can reach forward >12 cm safely (5) From Standing Position, Pick up Object from Floor: Unable to pick up shoe, but reaches 2-5 cm (1-2) from shoe and balances independently From Standing Position, Turn to Look Behind Over each Shoulder: Turn sideways only but maintains balance Turn 360 Degrees: Needs close supervision or verbal cueing Standing Unsupported, Alternately Place Feet on Step/Stool: Able to complete >2 steps/needs minimal assist Standing Unsupported, One Foot in Front: Able to take small step independently and hold 30 seconds Standing on One Leg: Tries to lift leg/unable to hold 3 seconds but remains standing independently Total Score: 33        Communication Communication Communication: Impaired Factors Affecting Communication: Difficulty expressing self;Reduced clarity of speech  Cognition Arousal: Alert Behavior During Therapy: WFL for tasks assessed/performed   PT - Cognitive impairments: No apparent impairments                       PT - Cognition Comments: pt is pleasant and agreeable to PT session Following commands: Intact      Cueing Cueing Techniques: Verbal cues, Visual cues  Exercises      General Comments        Pertinent Vitals/Pain Pain Assessment Pain Assessment: No/denies pain    Home Living                          Prior Function            PT Goals (current goals can now be found in the care plan section) Acute Rehab PT Goals PT Goal Formulation: With patient Time For Goal Achievement: 08/16/24 Potential to Achieve Goals: Good Progress towards PT goals: Progressing toward goals    Frequency    Min 2X/week      PT Plan      Co-evaluation               AM-PAC PT 6 Clicks Mobility   Outcome Measure  Help needed turning from your back to your side while in a flat bed without using bedrails?: A Little Help needed moving from lying on your back to sitting on the side of a flat bed without using bedrails?: A Little Help needed moving to and from a bed to a chair (including a wheelchair)?: A Little Help needed standing up from a chair using your arms (e.g., wheelchair or bedside chair)?: A Little Help needed to walk in hospital room?: A Lot Help needed climbing 3-5 steps with a railing? : A Lot 6 Click Score: 16    End of Session Equipment Utilized During Treatment: Gait belt Activity Tolerance: Patient tolerated treatment well Patient left: in chair;with call bell/phone within reach;with family/visitor present Nurse Communication: Mobility status PT Visit Diagnosis: Unsteadiness on feet (R26.81);Muscle weakness (generalized) (M62.81);Other symptoms and signs involving the nervous system (R29.898)     Time: 8973-8946 PT Time Calculation (min) (ACUTE ONLY): 27 min  Charges:  Alorah Mcree, SPT    Haygen Zebrowski 08/03/2024, 1:18 PM

## 2024-08-03 NOTE — Progress Notes (Addendum)
 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  Patient presents this admission with difficulty speaking and increased balance issues.  On arrival to the ED labs and vitals were unremarkable.  Code stroke was activated.  Head CT was without acute findings.  CTA head and neck with and without LVO.  Neurology was consulted.  Brain MRI performed which shows acute left hemispheric strokes in a watershed pattern.  Subjective: No acute events overnight.  Patient feels very encouraged by his improvement.  His partner is also at bedside and agrees that he has made significant improvement in his aphasia over the last 24 hours.  He is anxious to continue working with physical therapy and get to IPR.  Objective: Vitals:   08/03/24 0015 08/03/24 0525 08/03/24 0756 08/03/24 1156  BP: 126/80 110/73 125/76 118/78  Pulse: (!) 59 62 (!) 59 74  Resp: 19 18 17 16   Temp: 98.2 F (36.8 C) 98.6 F (37 C) 97.9 F (36.6 C) 97.9 F (36.6 C)  TempSrc: Oral Oral Oral Oral  SpO2: 98% 94% 96% 95%  Weight:      Height:        Intake/Output Summary (Last 24 hours) at 08/03/2024 1503 Last data filed at 08/03/2024 1300 Gross per 24 hour  Intake 50 ml  Output 450 ml  Net -400 ml   Filed Weights   08/01/24 1804 08/01/24 2013  Weight: 90.4 kg 86.7 kg    Examination: General exam: Appears calm and comfortable, NAD  Respiratory system: No work of breathing, symmetric chest wall expansion Cardiovascular system: S1 & S2 heard, RRR.  Gastrointestinal system:  Abdomen is nondistended, soft and nontender.  Neuro: Alert, oriented.  Word-finding difficulty. Extremities: Symmetric, expected ROM Skin: No rashes, lesions Psychiatry: Demonstrates appropriate judgement and insight. Mood & affect appropriate for situation.   Assessment & Plan:  Principal Problem:   Stroke-like symptoms    Acute ischemic infarct Aphasia - Brain MRI with watershed pattern in the left hemisphere.  Likely secondary to large vessel disease. - A1c and LDL at goal - Continue aspirin , statin, Plavix  - Echo with preserved EF, LV has new regional wall motion abnormalities.  Bubble study negative.   - Neurology consulted - Continue Keppra  at home dose - PT/OT/SLP.  Will likely need IPR/SNF at DC.  TOC consulted  Coronary artery stenosis, status post left stent - Discussed current stent with vascular surgery.  Patent on CT.  Vascular surgery sees no need for acute intervention at this time.  Continue outpatient follow-up - Continue with aspirin  Plavix   History of CAD status post 5 vessel CABG - Continue aspirin , statin, Plavix , Zetia  - Patient follows closely with Dr. Perla outpatient.  Has had recent echocardiogram - Echo this admission shows preserved EF 55 to 60% but left ventricle does show new wall motion abnormalities.  Patient denies recent anginal symptoms.  No chest pain or shortness of breath currently. *Addendum: On discussion with cardiology they have reviewed the 2024 echo and report that regional wall motion abnormalities were present at that time as well.  This appears to  be a chronic and not new finding. -- Can follow up in clinic  Ventricular bigeminy, resolved.  - Noted overnight on telemetry 10/1 - Continue telemetry monitoring - Maintain normalize electrolytes - Echocardiogram as above.   Diabetes - Hemoglobin A1c 6.6%, resume metformin  at discharge - No indication for insulin  at this time  Hypertension - BP currently at goal without  medication   DVT prophylaxis: Lovenox    Code Status: Full Code Disposition: Planning for IPR.  Pending insurance Auth  Consultants:    Procedures:    Antimicrobials:  Anti-infectives (From admission, onward)    None       Data Reviewed: I have personally reviewed following labs and imaging studies CBC: Recent Labs  Lab 08/01/24 1528  WBC 7.8  NEUTROABS 5.0  HGB 14.2  HCT 42.3  MCV 97.2  PLT 178   Basic Metabolic Panel: Recent Labs  Lab 08/01/24 1528 08/02/24 0025  NA 140 139  K 4.1 3.9  CL 103 104  CO2 25 25  GLUCOSE 121* 108*  BUN 18 15  CREATININE 1.08 0.97  CALCIUM  9.3 9.0  MG  --  2.1   GFR: Estimated Creatinine Clearance: 68.6 mL/min (by C-G formula based on SCr of 0.97 mg/dL). Liver Function Tests: Recent Labs  Lab 08/01/24 1528  AST 21  ALT 14  ALKPHOS 49  BILITOT 0.9  PROT 7.5  ALBUMIN  4.3   CBG: Recent Labs  Lab 08/01/24 1525  GLUCAP 114*    No results found for this or any previous visit (from the past 240 hours).   Radiology Studies: ECHOCARDIOGRAM COMPLETE BUBBLE STUDY Result Date: 08/02/2024    ECHOCARDIOGRAM REPORT   Patient Name:   Shannon Chung Date of Exam: 08/02/2024 Medical Rec #:  [id number]             Height:       73.0 in Accession #:    [id number]            Weight:       191.1 lb Date of Birth:  1941-09-11             BSA:          2.111 m Patient Age:    82 years              BP:           109/66 mmHg Patient Gender: M                     HR:           62 bpm. Exam Location:  [facility] Procedure: 2D Echo, Cardiac Doppler, Color Doppler, Intracardiac Opacification            Agent and Saline Contrast Bubble Study (Both Spectral and Color Flow            Doppler were utilized during procedure). Indications:     Stroke 434.91 / I63.9  History:         Patient has prior history of Echocardiogram examinations, most                  recent 02/23/2023. CAD, Prior CABG; Stroke.  Sonographer:     Ashley McNeely-Sloane Referring  Phys:  [id number] Valia Wingard Diagnosing Phys: Evalene Lunger MD  Sonographer Comments: Technically difficult study due to poor echo windows. IMPRESSIONS  1. Left ventricular ejection fraction, by estimation, is 55 to 60%. The left ventricle has normal function. The  left ventricle demonstrates regional wall motion abnormalities (hypokinesis of the mid to distal anteroseptal and apical region). Left ventricular diastolic parameters are consistent with Grade I diastolic dysfunction (impaired relaxation).  2. Right ventricular systolic function is normal. The right ventricular size is normal.  3. The mitral valve is normal in structure. No evidence of mitral valve regurgitation. No evidence of mitral stenosis.  4. The aortic valve is tricuspid. There is mild calcification of the aortic valve. Aortic valve regurgitation is not visualized. Aortic valve sclerosis is present, with no evidence of aortic valve stenosis.  5. There is borderline dilatation of the aortic root, measuring 39 mm. There is borderline dilatation of the ascending aorta, measuring 38 mm.  6. The inferior vena cava is normal in size with greater than 50% respiratory variability, suggesting right atrial pressure of 3 mmHg.  7. Agitated saline contrast bubble study was negative, with no evidence of any interatrial shunt. FINDINGS  Left Ventricle: Left ventricular ejection fraction, by estimation, is 55 to 60%. The left ventricle has normal function. The left ventricle demonstrates regional wall motion abnormalities. Definity  contrast agent was given IV to delineate the left ventricular endocardial borders. Strain was performed and the global longitudinal strain is indeterminate. The left ventricular internal cavity size was normal in size. There is no left ventricular hypertrophy. Left ventricular diastolic parameters are consistent with Grade I diastolic dysfunction (impaired relaxation). Right Ventricle: The right ventricular size is normal. No  increase in right ventricular wall thickness. Right ventricular systolic function is normal. Left Atrium: Left atrial size was normal in size. Right Atrium: Right atrial size was normal in size. Pericardium: There is no evidence of pericardial effusion. Mitral Valve: The mitral valve is normal in structure. No evidence of mitral valve regurgitation. No evidence of mitral valve stenosis. MV peak gradient, 4.5 mmHg. The mean mitral valve gradient is 1.0 mmHg. Tricuspid Valve: The tricuspid valve is normal in structure. Tricuspid valve regurgitation is not demonstrated. No evidence of tricuspid stenosis. Aortic Valve: The aortic valve is tricuspid. There is mild calcification of the aortic valve. Aortic valve regurgitation is not visualized. Aortic valve sclerosis is present, with no evidence of aortic valve stenosis. Aortic valve mean gradient measures 4.0 mmHg. Aortic valve peak gradient measures 7.6 mmHg. Aortic valve area, by VTI measures 3.97 cm. Pulmonic Valve: The pulmonic valve was normal in structure. Pulmonic valve regurgitation is not visualized. No evidence of pulmonic stenosis. Aorta: The aortic root is normal in size and structure. There is borderline dilatation of the aortic root, measuring 39 mm. There is borderline dilatation of the ascending aorta, measuring 38 mm. Venous: The inferior vena cava is normal in size with greater than 50% respiratory variability, suggesting right atrial pressure of 3 mmHg. IAS/Shunts: No atrial level shunt detected by color flow Doppler. Agitated saline contrast was given intravenously to evaluate for intracardiac shunting. Agitated saline contrast bubble study was negative, with no evidence of any interatrial shunt. There  is no evidence of a patent foramen ovale. There is no evidence of an atrial septal defect. Additional Comments: 3D was performed not requiring image post processing on an independent workstation and was indeterminate.  LEFT VENTRICLE PLAX 2D LVIDd:          5.30 cm   Diastology LVIDs:         4.00 cm   LV e' medial:    6.67 cm/s LV PW:         1.00 cm   LV E/e' medial:  7.9 LV IVS:        0.90 cm   LV e' lateral:   7.33 cm/s LVOT diam:     2.40 cm   LV E/e' lateral: 7.2 LV SV:         88 LV SV Index:   42 LVOT Area:     4.52 cm  RIGHT VENTRICLE RV S prime:     10.80 cm/s TAPSE (M-mode): 1.1 cm LEFT ATRIUM             Index        RIGHT ATRIUM           Index LA diam:        3.20 cm 1.52 cm/m   RA Area:     20.00 cm LA Vol (A2C):   34.6 ml 16.39 ml/m  RA Volume:   59.10 ml  28.00 ml/m LA Vol (A4C):   17.0 ml 8.05 ml/m LA Biplane Vol: 26.4 ml 12.51 ml/m  AORTIC VALVE                    PULMONIC VALVE AV Area (Vmax):    3.47 cm     PV Vmax:        1.02 m/s AV Area (Vmean):   3.25 cm     PV Vmean:       70.600 cm/s AV Area (VTI):     3.97 cm     PV VTI:         0.213 m AV Vmax:           138.00 cm/s  PV Peak grad:   4.2 mmHg AV Vmean:          96.300 cm/s  PV Mean grad:   2.0 mmHg AV VTI:            0.221 m      RVOT Peak grad: 1 mmHg AV Peak Grad:      7.6 mmHg AV Mean Grad:      4.0 mmHg LVOT Vmax:         106.00 cm/s LVOT Vmean:        69.200 cm/s LVOT VTI:          0.194 m LVOT/AV VTI ratio: 0.88  AORTA Ao Root diam: 3.90 cm Ao Asc diam:  3.80 cm MITRAL VALVE MV Area (PHT): 2.32 cm    SHUNTS MV Area VTI:   3.20 cm    Systemic VTI:  0.19 m MV Peak grad:  4.5 mmHg    Systemic Diam: 2.40 cm MV Mean grad:  1.0 mmHg    Pulmonic VTI:  0.152 m MV Vmax:       1.06 m/s MV Vmean:      46.7 cm/s MV Decel Time: 327 msec MV E velocity: 53.00 cm/s MV A velocity: 95.30 cm/s MV E/A ratio:  0.56 Evalene Lunger MD Electronically signed by Evalene Lunger MD Signature Date/Time: 08/02/2024/4:24:15 PM    Final    MR BRAIN WO CONTRAST Result Date: 08/01/2024 CLINICAL DATA:  Initial evaluation for acute neuro deficit, stroke suspected. EXAM: MRI HEAD WITHOUT CONTRAST TECHNIQUE: Multiplanar, multiecho pulse sequences of the brain and surrounding structures were obtained  without intravenous contrast. COMPARISON:  CTs from earlier the same day. FINDINGS: Brain: Generalized age-related cerebral atrophy. Patchy T2/FLAIR hyperintensity involving the supratentorial cerebral white matter, consistent with chronic small vessel ischemic disease, mild in nature. Few small remote lacunar infarcts present about the left greater than right  basal ganglia. Encephalomalacia gliosis at the posterior left frontoparietal region near the vertex, consistent with prior infarcts. Scattered patchy small volume foci of restricted diffusion seen involving the cortical subcortical left frontal, parietal, and temporal lobes, left MCA distribution, consistent with acute ischemic infarcts. These are superimposed on the underlying chronic left frontoparietal infarct. These are somewhat watershed in distribution. Patchy involvement of the basal ganglia noted as well. No associated hemorrhage or mass effect. No other evidence for acute or subacute ischemia. No acute intracranial hemorrhage. Single punctate chronic microhemorrhage noted at the left frontal centrum semi ovale, of doubtful significance in isolation. No mass lesion, midline shift or mass effect no hydrocephalus or extra-axial fluid collection. Partially empty sella noted. Vascular: Major intracranial vessel flow voids are maintained. Skull and upper cervical spine: Cranial junction with normal limits. Bone marrow signal intensity normal. No scalp soft tissue abnormality. Sinuses/Orbits: Prior bilateral ocular lens replacement. Few small retention cysts noted about the maxillary sinuses. Paranasal sinuses are otherwise largely clear. Small bilateral mastoid effusions noted, of doubtful significance. Image nasopharynx unremarkable. Other: None. IMPRESSION: 1. Scattered patchy small volume acute ischemic nonhemorrhagic left MCA distribution infarcts as above. No associated mass effect. 2. Underlying chronic left frontoparietal infarct, with additional  small remote lacunar infarcts about the left greater than right basal ganglia. 3. Underlying age-related cerebral atrophy with mild chronic small vessel ischemic disease. Electronically Signed   By: Morene Hoard M.D.   On: 08/01/2024 22:28   DG Chest Port 1 View Result Date: 08/01/2024 EXAM: 1 VIEW(S) XRAY OF THE CHEST 08/01/2024 08:26:00 PM COMPARISON: 04/15/2021 CLINICAL HISTORY: [id number] Stroke-like symptoms [id number]. The reason for exam is stated as stroke-like symptoms. Hx of diabetes, HTN, prostate cancer, cardiac cath, and former smoker. FINDINGS: LINES, TUBES AND DEVICES: Loop recorder is present. LUNGS AND PLEURA: No focal pulmonary opacity. No pulmonary edema. No pleural effusion. No pneumothorax. HEART AND MEDIASTINUM: Stable cardiomediastinal silhouette. Aortic atherosclerotic calcification. BONES AND SOFT TISSUES: Sternotomy wires are present. IMPRESSION: 1. No acute process. Electronically signed by: Norman Gatlin MD 08/01/2024 08:28 PM EDT RP Workstation: [id number]   CT ANGIO HEAD NECK W WO CM W PERF (CODE STROKE) Result Date: 08/01/2024 EXAM: CTA Head and Neck with Perfusion 08/01/2024 03:45:33 PM TECHNIQUE: CTA of the head and neck was performed with and without the administration of 100 mL of iohexol  (OMNIPAQUE ) 350 MG/ML injection. 3D postprocessing with multiplanar reconstructions and MIPs was performed to evaluate the vascular anatomy. Cerebral perfusion analysis using computed tomography with contrast administration, including post-processing of parametric maps with determination of cerebral blood flow, cerebral blood volume, mean transit time and time-to-maximum. Automated exposure control, iterative reconstruction, and/or weight based adjustment of the mA/kV was utilized to reduce the radiation dose to as low as reasonably achievable. COMPARISON: CT head and CTA head and neck dated 04/26/2023. CLINICAL HISTORY: Neuro deficit, acute, stroke suspected. Code stroke Dr. Voncile  [phone]. FINDINGS: CTA NECK: AORTIC ARCH AND ARCH VESSELS: Moderate atherosclerosis of the visualized aortic arch. Atherosclerosis involving the proximal aspect of both subclavian arteries without high grade stenosis. No dissection or arterial injury. CERVICAL CAROTID ARTERIES: Right: Mixed atherosclerotic plaque along the distal right common carotid artery resulting in mild stenosis. Additional mixed atherosclerotic plaque at the right carotid bifurcation extending into the proximal right cervical ICA resulting in approximately 60% stenosis of the proximal right cervical ICA. There is additional stenosis at the origin of the right external carotid artery. Left: Mixed atherosclerotic plaque in the left common carotid artery. Stent extending from  the distal left common carotid artery into the proximal left cervical ICA. There is slightly limited evaluation although the visualized portions of the stent appear patent and the vessel is patent proximal and distal to the stent. No evidence of hemodynamically significant stenosis of the left cervical ICA. No dissection or arterial injury. CERVICAL VERTEBRAL ARTERIES: The left vertebral artery is dominant. Atherosclerosis at the left vertebral artery origin resulting in moderate stenosis. Additional multifocal atherosclerosis of the V2 and V4 segments of the left vertebral artery. No dissection or arterial injury. LUNGS AND MEDIASTINUM: Medial sternotomy. Multiple nodules in the right thyroid  lobe measuring up to 1.1 cm without findings to suggest additional required follow up. SOFT TISSUES: No acute abnormality. BONES: Degenerative changes throughout the visualized spine. Disc space narrowing most pronounced at C5-C6 and C6-C7. No acute abnormality. CTA HEAD: ANTERIOR CIRCULATION: The intracranial internal carotid arteries are patent bilaterally. Atherosclerosis of the bilateral carotid siphons. There is mild stenosis of the right cavernous ICA. Additional mild to  moderate stenosis of the left cavernous and paraclinoid ICA. The middle cerebral arteries are patent bilaterally. The anterior cerebral arteries are patent bilaterally. No aneurysm. POSTERIOR CIRCULATION: No significant stenosis of the posterior cerebral arteries. No significant stenosis of the basilar artery. No significant stenosis of the vertebral arteries. No aneurysm. OTHER: No dural venous sinus thrombosis on this non-dedicated study. CT PERFUSION: EXAM QUALITY: Exam quality is adequate with diagnostic perfusion maps. No significant motion artifact. Appropriate arterial inflow and venous outflow curves. CORE INFARCT (CBF<30% volume): 0 mL TOTAL HYPOPERFUSION (Tmax>6s volume): 0 mL PENUMBRA: Mismatch volume: 0 mL Mismatch ratio: not applicable Location: not applicable IMPRESSION: 1. No acute large vessel occlusion. 2. No evidence of ischemia by CT brain perfusion. 3. Stent extending from the distal left common carotid artery into the proximal left cervical ICA. The vessel is patent proximal and distal to the stent. 4. Mixed atherosclerotic plaque resulting in approximately 60% stenosis of the proximal right cervical ICA. 5. Atherosclerosis at the left vertebral artery origin resulting in moderate stenosis. 6. Atherosclerosis of the bilateral carotid siphons with mild stenosis of the right cavernous ICA and mild to moderate stenosis of the left cavernous and paraclinoid ICA. Electronically signed by: Donnice Mania MD 08/01/2024 04:13 PM EDT RP Workstation: [id number]   CT HEAD CODE STROKE WO CONTRAST Result Date: 08/01/2024 EXAM: CT HEAD WITHOUT CONTRAST 08/01/2024 03:32:31 PM TECHNIQUE: CT of the head was performed without the administration of intravenous contrast. Automated exposure control, iterative reconstruction, and/or weight based adjustment of the mA/kV was utilized to reduce the radiation dose to as low as reasonably achievable. COMPARISON: MRI head 05/02/2023. CLINICAL HISTORY: Neuro deficit,  acute, stroke suspected. Code stroke. Dr. Arora [phone]. FINDINGS: BRAIN AND VENTRICLES: No acute hemorrhage. No evidence of acute infarct. Encephalomalacia in the left frontoparietal lobes near the vertex compatible with remote infarcts. There is signal abnormality in the central aspect of the pons which may be artifactual versus related to remote infarcts. Mild parenchymal volume loss. Additional remote infarct in the left caudate. Atherosclerosis of the carotid siphons and intracranial vertebral arteries. No hydrocephalus. No extra-axial collection. No mass effect or midline shift. ORBITS: Bilateral lens replacement. No acute abnormality. SINUSES: No acute abnormality. SOFT TISSUES AND SKULL: Small right mastoid effusion. No acute soft tissue abnormality. No skull fracture. [place] stroke program early CT (aspect) score: Ganglionic (caudate, ic, Lentiform Nucleus, insula, M1-m3): 7 Supraganglionic (m4-m6): 3 Total: 10 IMPRESSION: 1. No acute intracranial abnormality. 2. Encephalomalacia in the left frontoparietal lobes near  the vertex compatible with remote infarcts. 3. Signal abnormality in the central aspect of the pons, possibly artifactual or related to remote infarct. 4. Additional remote infarct in the left caudate. 5. Mild parenchymal volume loss. 6. Findings messaged to Dr. Arora at 3:46PM on 08/01/24. Electronically signed by: Donnice Mania MD 08/01/2024 03:47 PM EDT RP Workstation: [id number]    Scheduled Meds:  aspirin  EC  162 mg Oral Daily   atorvastatin   40 mg Oral Daily   clopidogrel   75 mg Oral Daily   enoxaparin  (LOVENOX ) injection  40 mg Subcutaneous Q24H   ezetimibe   10 mg Oral Daily   levETIRAcetam   500 mg Oral BID   Continuous Infusions:   LOS: 1 day  MDM: Patient is high risk for one or more organ failure.  They necessitate ongoing hospitalization for continued IV therapies and subsequent lab monitoring. Total time spent interpreting labs and vitals, reviewing the medical  record, coordinating care amongst consultants and care team members, directly assessing and discussing care with the patient and/or family: 55 min  Livio Ledwith, DO [facility]  To contact the attending physician between 7A-7P please use Epic Chat. To contact the covering physician during after hours 7P-7A, please review Amion.  08/03/2024, 3:03 PM   *This document has been created with the assistance of dictation software. Please excuse typographical errors. *

## 2024-08-04 ENCOUNTER — Encounter

## 2024-08-04 DIAGNOSIS — R2990 Unspecified symptoms and signs involving the nervous system: Secondary | ICD-10-CM | POA: Diagnosis not present

## 2024-08-04 MED ORDER — ACETAMINOPHEN 325 MG PO TABS
650.0000 mg | ORAL_TABLET | Freq: Once | ORAL | Status: AC
  Administered 2024-08-04: 650 mg via ORAL
  Filled 2024-08-04: qty 2

## 2024-08-04 NOTE — Evaluation (Deleted)
 Physical Therapy Evaluation Patient Details Name: Shannon Chung MRN: [id number] DOB: 15-Jul-1941 Today's Date: 08/04/2024  History of Present Illness  Pt is an 83 y.o. male presenting with word finding and balance difficulties. 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 Patient is A&O x 4. Baseline mobility reported as modI, currently requiring CGA  for gait with SPC . Gait assessed with SPC demonstrated some unsteadiness in gait with wide BOS. Pt able to ambulate 250 feet with light verbal cueing. Pt educated on DME use and the importance of diet and exercise.Clinical impression: patient presents with moderate mobility limitations.. Recommend skilled PT to address safety, mobility, and discharge planning.          If plan is discharge home, recommend the following: A little help with bathing/dressing/bathroom;Assistance with cooking/housework;Assist for transportation;Direct supervision/assist for medications management;Supervision due to cognitive status;A little help with walking and/or transfers   Can travel by private vehicle        Equipment Recommendations Other (comment)  Recommendations for Other Services       Functional Status Assessment Patient has had a recent decline in their functional status and demonstrates the ability to make significant improvements in function in a reasonable and predictable amount of time.     Precautions / Restrictions Restrictions Weight Bearing Restrictions Per Provider Order: No      Mobility  Bed Mobility                    Transfers Overall transfer level: Needs assistance Equipment used: None Transfers: Sit to/from Stand Sit to Stand: Contact  guard assist                Ambulation/Gait Ambulation/Gait assistance: Contact guard assist Gait Distance (Feet): 250 Feet Assistive device: Straight cane Gait Pattern/deviations: Decreased dorsiflexion - right, Trunk flexed, Step-to pattern, Decreased dorsiflexion - left       General Gait Details: CGA for safey.  Stairs            Wheelchair Mobility     Tilt Bed    Modified Rankin (Stroke Patients Only)       Balance Overall balance assessment: Needs assistance Sitting-balance support: Feet supported Sitting balance-Leahy Scale: Normal     Standing balance support: Single extremity supported, During functional activity Standing balance-Leahy Scale: Fair Standing balance comment: able to stand with occassional need for UE assistance for balance                             Pertinent Vitals/Pain Pain Assessment Pain Assessment: No/denies pain    Home Living Family/patient expects to be discharged to:: Private residence Living Arrangements: Spouse/significant other Available Help at Discharge: Available 24 hours/day Type of Home: House Home Access: Level entry       Home Layout: One level Home Equipment: Agricultural consultant (2 wheels);Cane - single point      Prior Function Prior Level of Function : Independent/Modified Independent;Driving             Mobility Comments: independent, 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[employer], no falls but spouse endorses pt mostly sedentary at home with poor balance ADLs Comments: independent, driving     Extremity/Trunk Assessment  Communication   Communication Communication: Impaired Factors Affecting Communication: Difficulty expressing self;Reduced clarity of speech    Cognition Arousal: Alert Behavior During Therapy: WFL for tasks assessed/performed   PT - Cognitive impairments: No apparent impairments                       PT - Cognition Comments: pt is pleasant  and agreeable to PT session Following commands: Intact       Cueing Cueing Techniques: Verbal cues, Visual cues     General Comments General comments (skin integrity, edema, etc.): educated provided on wide BOS for safety and balance, DME use to optimize support, recommendation for life long exercise and dietary changes    Exercises     Assessment/Plan    PT Assessment Patient needs continued PT services  PT Problem List Decreased strength;Decreased activity tolerance;Decreased balance;Decreased mobility;Decreased knowledge of use of DME;Decreased safety awareness       PT Treatment Interventions Gait training;DME instruction;Functional mobility training;Therapeutic activities;Therapeutic exercise;Balance training;Neuromuscular re-education;Patient/family education    PT Goals (Current goals can be found in the Care Plan section)  Acute Rehab PT Goals Patient Stated Goal: to get better and return to volunteering PT Goal Formulation: With patient Time For Goal Achievement: 08/16/24    Frequency Min 2X/week     Co-evaluation               AM-PAC PT 6 Clicks Mobility  Outcome Measure Help needed turning from your back to your side while in a flat bed without using bedrails?: None Help needed moving from lying on your back to sitting on the side of a flat bed without using bedrails?: None Help needed moving to and from a bed to a chair (including a wheelchair)?: A Little Help needed standing up from a chair using your arms (e.g., wheelchair or bedside chair)?: A Little Help needed to walk in hospital room?: A Little Help needed climbing 3-5 steps with a railing? : A Lot 6 Click Score: 19    End of Session Equipment Utilized During Treatment: Gait belt Activity Tolerance: Patient tolerated treatment well Patient left: with family/visitor present;in bed Nurse Communication: Mobility status PT Visit Diagnosis: Unsteadiness on feet (R26.81);Muscle weakness  (generalized) (M62.81);Other symptoms and signs involving the nervous system (R29.898)    Time: 8969-8957 PT Time Calculation (min) (ACUTE ONLY): 12 min   Charges:     PT Treatments $Therapeutic Activity: 8-22 mins PT General Charges $$ ACUTE PT VISIT: 1 Visit         Sherlean Lesches DPT, PT    Sherlean A Kiet Geer 08/04/2024, 11:02 AM

## 2024-08-04 NOTE — Progress Notes (Signed)
 Occupational Therapy Treatment Patient Details Name: Shannon Chung MRN: [id number] DOB: 1941-08-12 Today's Date: 08/04/2024   History of present illness Pt is an 83 y.o. male presenting with word finding and balance difficulties. 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 PMH significant for CAD s/p CABGX5, CVA, dyslipidemia, HTN, hx of stereotypic spells of speech disturbance with EEG showing left hemispheric cortical dysfunction   OT comments  Shannon Chung demonstrated good progress this date, able to perform bed mobility, transfers, toilet, grooming, upper body and lower body dressing, all with SUPV-CGA. Pt is more alert and displays improved balance from earlier sessions, able to ambulate briefly without RW w/ close SUPV for safety. Provided educ re: importance of seeing immediate medical care in presence of any signs/symptoms of stroke.      If plan is discharge home, recommend the following:      Equipment Recommendations       Recommendations for Other Services      Precautions / Restrictions Precautions Precautions: Fall Recall of Precautions/Restrictions: Intact Restrictions Weight Bearing Restrictions Per Provider Order: No       Mobility Bed Mobility Overal bed mobility: Needs Assistance Bed Mobility: Supine to Sit     Supine to sit: Contact guard          Transfers Overall transfer level: Needs assistance Equipment used: None Transfers: Sit to/from Stand Sit to Stand: Contact guard assist                 Balance Overall balance assessment: Needs assistance Sitting-balance support: Feet supported Sitting balance-Leahy Scale: Normal Sitting balance - Comments: steady reaching outside BOS. able to don shoes after set up while sitting at EOB   Standing balance support: During functional activity, No upper extremity supported Standing balance-Leahy Scale: Fair                              ADL either performed or assessed with clinical judgement   ADL Overall ADL's : Needs assistance/impaired                 Upper Body Dressing : Modified independent;Standing Upper Body Dressing Details (indicate cue type and reason): changing shirt Lower Body Dressing: Modified independent Lower Body Dressing Details (indicate cue type and reason): donning socks and shoes Toilet Transfer: Ambulation;Rolling walker (2 wheels);Contact guard assist                  Extremity/Trunk Assessment              Vision       Perception     Praxis     Communication Communication Communication: Impaired Factors Affecting Communication: Difficulty expressing self;Reduced clarity of speech   Cognition Arousal: Alert Behavior During Therapy: WFL for tasks assessed/performed                                          Cueing      Exercises      Shoulder Instructions       General Comments educated provided on wide BOS for safety and balance, DME use to optimize support, recommendation for life long exercise and dietary changes    Pertinent Vitals/ Pain       Pain Assessment Pain Assessment: No/denies pain  Home Living Family/patient expects to be discharged  to:: Private residence Living Arrangements: Spouse/significant other Available Help at Discharge: Available 24 hours/day Type of Home: House Home Access: Level entry     Home Layout: One level     Bathroom Shower/Tub: Producer, television/film/video: Standard     Home Equipment: Agricultural consultant (2 wheels);Cane - single point          Prior Functioning/Environment              Frequency           Progress Toward Goals  OT Goals(current goals can now be found in the care plan section)  Progress towards OT goals: Progressing toward goals     Plan      Co-evaluation                 AM-PAC OT 6 Clicks Daily Activity     Outcome Measure   Help from  another person eating meals?: A Little Help from another person taking care of personal grooming?: A Little Help from another person toileting, which includes using toliet, bedpan, or urinal?: A Little Help from another person bathing (including washing, rinsing, drying)?: A Lot Help from another person to put on and taking off regular upper body clothing?: A Little Help from another person to put on and taking off regular lower body clothing?: A Lot 6 Click Score: 16    End of Session    OT Visit Diagnosis: Other symptoms and signs involving the nervous system (R29.898);Other abnormalities of gait and mobility (R26.89);Unsteadiness on feet (R26.81);Muscle weakness (generalized) (M62.81);Cognitive communication deficit (R41.841)   Activity Tolerance Patient tolerated treatment well   Patient Left in chair;with family/visitor present;Other (comment) (w/ PT in room)   Nurse Communication          Time: 1020-1030 OT Time Calculation (min): 10 min  Charges: OT General Charges $OT Visit: 1 Visit OT Treatments $Self Care/Home Management : 8-22 mins Suzen Hock, PhD, MS, OTR/L 08/04/24, 12:45 PM

## 2024-08-04 NOTE — Progress Notes (Signed)
 PROGRESS NOTE    Shannon Chung  FMW:[id number] DOB: [birth year] DOA: 08/01/2024 PCP: Gretel App, NP  Chief Complaint  Patient presents with   Code Stroke    Hospital Course:  Shannon Chung is an 83 year old male with history of left carotid artery stent, CAD status post 5 vessel CABG, dyslipidemia, hypertension, history of stereotypic spells with speech disturbance and prior EEG showing left hemispheric cortical dysfunction on Keppra , who presented for speech related disturbances.  Patient presents this admission with difficulty speaking and increased balance issues.  On arrival to the ED labs and vitals were unremarkable.  Code stroke was activated.  Head CT was without acute findings.  CTA head and neck with and without LVO.  Neurology was consulted.  Brain MRI performed which shows acute left hemispheric strokes in a watershed pattern.  Subjective: No acute events overnight.  Patient is in good spirits today.  His partner is at bedside.  We are awaiting discharge to IPR.  Objective: Vitals:   08/04/24 0446 08/04/24 0826 08/04/24 1157 08/04/24 1524  BP: (!) 143/89 122/88 124/79 108/74  Pulse: 64 67 100 68  Resp: 18 14 16 20   Temp: 98 F (36.7 C) 98.5 F (36.9 C) 98 F (36.7 C) 97.8 F (36.6 C)  TempSrc: Oral Oral Oral Oral  SpO2: 95% 97% 99% 96%  Weight:      Height:        Intake/Output Summary (Last 24 hours) at 08/04/2024 1546 Last data filed at 08/04/2024 1328 Gross per 24 hour  Intake 840 ml  Output 2000 ml  Net -1160 ml   Filed Weights   08/01/24 1804 08/01/24 2013  Weight: 90.4 kg 86.7 kg    Examination: General exam: Appears calm and comfortable, NAD  Respiratory system: No work of breathing, symmetric chest wall expansion Cardiovascular system: S1 & S2 heard, RRR.  Gastrointestinal system: Abdomen is nondistended, soft and nontender.  Neuro: Alert, oriented.  Word-finding difficulty has resolved.  No evidence of aphasia on exam  today Extremities: Symmetric, expected ROM Skin: No rashes, lesions Psychiatry: Demonstrates appropriate judgement and insight. Mood & affect appropriate for situation.   Assessment & Plan:  Principal Problem:   Stroke-like symptoms    Acute ischemic infarct Aphasia - Brain MRI with watershed pattern in the left hemisphere.  Likely secondary to large vessel disease. - A1c and LDL at goal - Continue aspirin , statin, Plavix  - Echo with preserved EF, LV has new regional wall motion abnormalities.  Bubble study negative.   - Neurology consulted - Continue Keppra  at home dose - PT/OT/SLP.  Pending IPR at DC  Coronary artery stenosis, status post left stent - Discussed current stent with vascular surgery.  Patent on CT.  Vascular surgery sees no need for acute intervention at this time.  Continue outpatient follow-up - Continue with aspirin  Plavix   History of CAD status post 5 vessel CABG - Continue aspirin , statin, Plavix , Zetia  - Patient follows closely with Dr. Perla outpatient.  Has had recent echocardiogram - Echo this admission shows preserved EF 55 to 60% but left ventricle does show new wall motion abnormalities.  Patient denies recent anginal symptoms.  No chest pain or shortness of breath currently. -Reviewed this echo with cardiology who compared it to 2024 echo and reports findings are consistent with 2024.  No acute workup needed.  This is chronic and not new finding - Will plan to follow-up in cardiology clinic  Ventricular bigeminy, resolved.  - Noted overnight on telemetry 10/1 -  Continue telemetry monitoring - Maintain normalize electrolytes - Echocardiogram as above.   Diabetes - Hemoglobin A1c 6.6%, resume metformin  at discharge - No indication for insulin  at this time  Hypertension - BP currently at goal without medication   DVT prophylaxis: Lovenox    Code Status: Full Code Disposition: Planning for IPR.  Personally performed peer to peer with Dr. Myrtle,  medical director from [facility] today.  She determined that the patient does not meet IPR criteria per CMS guidelines.  We were encouraged to follow directly with [facility] Medicare for appeal.  IPR team is pursuing this appeal, I will complete additional peer to peer when it is available..   Consultants:    Procedures:    Antimicrobials:  Anti-infectives (From admission, onward)    None       Data Reviewed: I have personally reviewed following labs and imaging studies CBC: Recent Labs  Lab 08/01/24 1528  WBC 7.8  NEUTROABS 5.0  HGB 14.2  HCT 42.3  MCV 97.2  PLT 178   Basic Metabolic Panel: Recent Labs  Lab 08/01/24 1528 08/02/24 0025  NA 140 139  K 4.1 3.9  CL 103 104  CO2 25 25  GLUCOSE 121* 108*  BUN 18 15  CREATININE 1.08 0.97  CALCIUM  9.3 9.0  MG  --  2.1   GFR: Estimated Creatinine Clearance: 68.6 mL/min (by C-G formula based on SCr of 0.97 mg/dL). Liver Function Tests: Recent Labs  Lab 08/01/24 1528  AST 21  ALT 14  ALKPHOS 49  BILITOT 0.9  PROT 7.5  ALBUMIN  4.3   CBG: Recent Labs  Lab 08/01/24 1525  GLUCAP 114*    No results found for this or any previous visit (from the past 240 hours).   Radiology Studies: No results found.   Scheduled Meds:  aspirin  EC  162 mg Oral Daily   atorvastatin   40 mg Oral Daily   clopidogrel   75 mg Oral Daily   enoxaparin  (LOVENOX ) injection  40 mg Subcutaneous Q24H   ezetimibe   10 mg Oral Daily   levETIRAcetam   500 mg Oral BID   Continuous Infusions:   LOS: 2 days  MDM: Patient is high risk for one or more organ failure.  They necessitate ongoing hospitalization for continued IV therapies and subsequent lab monitoring. Total time spent interpreting labs and vitals, reviewing the medical record, coordinating care amongst consultants and care team members, directly assessing and discussing care with the patient and/or family: 55 min  Keon Pender, DO [facility]  To contact the  attending physician between 7A-7P please use Epic Chat. To contact the covering physician during after hours 7P-7A, please review Amion.  08/04/2024, 3:46 PM   *This document has been created with the assistance of dictation software. Please excuse typographical errors. *

## 2024-08-04 NOTE — Progress Notes (Addendum)
 Inpatient Rehab Admissions Coordinator:   Awaiting insurance determination.    1941: Updated today that insurance had denied request and P2P completed by Dr. Leesa.  I notified pt's significant other, Georgi.  She is in agreement to not pursue appeal given pt's current higher level of function.  Would like to consider SNF vs. Home and therapy to weigh in.  I will sign off.   Reche Lowers, PT, DPT Admissions Coordinator [phone] 08/04/24  10:32 AM c

## 2024-08-04 NOTE — Plan of Care (Signed)

## 2024-08-04 NOTE — TOC Progression Note (Signed)
 Transition of Care [facility]) - Progression Note    Patient Details  Name: Shannon Chung MRN: [id number] Date of Birth: 20-Mar-1941  [facility] CM/SW Contact  Marinda Cooks, RN Phone Number: 08/04/2024, 3:04 PM  Clinical Narrative:    This CM updated that pt's CIR denied after peer to peer was completed by covering MD. PT will pursue appeal TOC will cont to follow dc planning /care coordination and update as applicable.                    Expected Discharge Plan and Services                                               Social Drivers of Health (SDOH) Interventions SDOH Screenings   Food Insecurity: No Food Insecurity (08/01/2024)  Housing: Low Risk  (08/01/2024)  Transportation Needs: No Transportation Needs (08/01/2024)  Utilities: Not At Risk (08/01/2024)  Alcohol Screen: Low Risk  (12/23/2023)  Depression (PHQ2-9): Low Risk  (06/28/2024)  Financial Resource Strain: Medium Risk (06/27/2024)  Physical Activity: Sufficiently Active (06/27/2024)  Social Connections: Socially Integrated (08/01/2024)  Stress: No Stress Concern Present (06/27/2024)  Tobacco Use: Medium Risk (08/01/2024)  Health Literacy: Adequate Health Literacy (05/28/2023)    Readmission Risk Interventions     No data to display

## 2024-08-04 NOTE — Plan of Care (Signed)
 Problem: Education: Goal: Knowledge of disease or condition will improve 08/04/2024 0449 by Pola Bridegroom, RN Outcome: Progressing 08/04/2024 0448 by Pola Bridegroom, RN Outcome: Progressing Goal: Knowledge of secondary prevention will improve (MUST DOCUMENT ALL) 08/04/2024 0449 by Pola Bridegroom, RN Outcome: Progressing 08/04/2024 0448 by Pola Bridegroom, RN Outcome: Progressing Goal: Knowledge of patient specific risk factors will improve (DELETE if not current risk factor) 08/04/2024 0449 by Pola Bridegroom, RN Outcome: Progressing 08/04/2024 0448 by Pola Bridegroom, RN Outcome: Progressing   Problem: Ischemic Stroke/TIA Tissue Perfusion: Goal: Complications of ischemic stroke/TIA will be minimized 08/04/2024 0449 by Pola Bridegroom, RN Outcome: Progressing 08/04/2024 0448 by Pola Bridegroom, RN Outcome: Progressing   Problem: Coping: Goal: Will verbalize positive feelings about self 08/04/2024 0449 by Pola Bridegroom, RN Outcome: Progressing 08/04/2024 0448 by Pola Bridegroom, RN Outcome: Progressing Goal: Will identify appropriate support needs 08/04/2024 0449 by Pola Bridegroom, RN Outcome: Progressing 08/04/2024 0448 by Pola Bridegroom, RN Outcome: Progressing   Problem: Health Behavior/Discharge Planning: Goal: Ability to manage health-related needs will improve 08/04/2024 0449 by Pola Bridegroom, RN Outcome: Progressing 08/04/2024 0448 by Pola Bridegroom, RN Outcome: Progressing Goal: Goals will be collaboratively established with patient/family 08/04/2024 0449 by Pola Bridegroom, RN Outcome: Progressing 08/04/2024 0448 by Pola Bridegroom, RN Outcome: Progressing   Problem: Self-Care: Goal: Ability to participate in self-care as condition permits will improve 08/04/2024 0449 by Pola Bridegroom, RN Outcome: Progressing 08/04/2024 0448 by Pola Bridegroom, RN Outcome: Progressing Goal: Verbalization of feelings and concerns over difficulty with self-care will improve 08/04/2024 0449 by Pola Bridegroom, RN Outcome: Progressing 08/04/2024 0448 by Pola Bridegroom,  RN Outcome: Progressing Goal: Ability to communicate needs accurately will improve 08/04/2024 0449 by Pola Bridegroom, RN Outcome: Progressing 08/04/2024 0448 by Pola Bridegroom, RN Outcome: Progressing   Problem: Nutrition: Goal: Risk of aspiration will decrease 08/04/2024 0449 by Pola Bridegroom, RN Outcome: Progressing 08/04/2024 0448 by Pola Bridegroom, RN Outcome: Progressing Goal: Dietary intake will improve 08/04/2024 0449 by Pola Bridegroom, RN Outcome: Progressing 08/04/2024 0448 by Pola Bridegroom, RN Outcome: Progressing   Problem: Education: Goal: Knowledge of General Education information will improve Description: Including pain rating scale, medication(s)/side effects and non-pharmacologic comfort measures 08/04/2024 0449 by Pola Bridegroom, RN Outcome: Progressing 08/04/2024 0448 by Pola Bridegroom, RN Outcome: Progressing   Problem: Health Behavior/Discharge Planning: Goal: Ability to manage health-related needs will improve 08/04/2024 0449 by Pola Bridegroom, RN Outcome: Progressing 08/04/2024 0448 by Pola Bridegroom, RN Outcome: Progressing   Problem: Clinical Measurements: Goal: Ability to maintain clinical measurements within normal limits will improve 08/04/2024 0449 by Pola Bridegroom, RN Outcome: Progressing 08/04/2024 0448 by Pola Bridegroom, RN Outcome: Progressing Goal: Will remain free from infection 08/04/2024 0449 by Pola Bridegroom, RN Outcome: Progressing 08/04/2024 0448 by Pola Bridegroom, RN Outcome: Progressing Goal: Diagnostic test results will improve 08/04/2024 0449 by Pola Bridegroom, RN Outcome: Progressing 08/04/2024 0448 by Pola Bridegroom, RN Outcome: Progressing Goal: Respiratory complications will improve 08/04/2024 0449 by Pola Bridegroom, RN Outcome: Progressing 08/04/2024 0448 by Pola Bridegroom, RN Outcome: Progressing Goal: Cardiovascular complication will be avoided 08/04/2024 0449 by Pola Bridegroom, RN Outcome: Progressing 08/04/2024 0448 by Pola Bridegroom, RN Outcome: Progressing   Problem: Activity: Goal: Risk for  activity intolerance will decrease 08/04/2024 0449 by Pola Bridegroom, RN Outcome: Progressing 08/04/2024 0448 by Pola Bridegroom, RN Outcome: Progressing   Problem: Nutrition: Goal: Adequate nutrition will be maintained 08/04/2024 0449 by Pola Bridegroom, RN Outcome: Progressing 08/04/2024 0448 by Pola Bridegroom, RN Outcome: Progressing   Problem: Coping: Goal: Level of anxiety will decrease  08/04/2024 0449 by Pola Bridegroom, RN Outcome: Progressing 08/04/2024 0448 by Pola Bridegroom, RN Outcome: Progressing   Problem: Elimination: Goal: Will not experience complications related to bowel motility 08/04/2024 0449 by Pola Bridegroom, RN Outcome: Progressing 08/04/2024 0448 by Pola Bridegroom, RN Outcome: Progressing Goal: Will not experience complications related to urinary retention 08/04/2024 0449 by Pola Bridegroom, RN Outcome: Progressing 08/04/2024 0448 by Pola Bridegroom, RN Outcome: Progressing   Problem: Pain Managment: Goal: General experience of comfort will improve and/or be controlled 08/04/2024 0449 by Pola Bridegroom, RN Outcome: Progressing 08/04/2024 0448 by Pola Bridegroom, RN Outcome: Progressing   Problem: Safety: Goal: Ability to remain free from injury will improve 08/04/2024 0449 by Pola Bridegroom, RN Outcome: Progressing 08/04/2024 0448 by Pola Bridegroom, RN Outcome: Progressing   Problem: Skin Integrity: Goal: Risk for impaired skin integrity will decrease 08/04/2024 0449 by Pola Bridegroom, RN Outcome: Progressing 08/04/2024 0448 by Pola Bridegroom, RN Outcome: Progressing

## 2024-08-04 NOTE — Progress Notes (Signed)
 Physical Therapy Treatment Patient Details Name: Shannon Chung MRN: [id number] DOB: [birth year] Today's Date: 08/04/2024   History of Present Illness Pt is an 83 y.o. male presenting with word finding and balance difficulties. 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 Patient is A&O x 4. Baseline mobility reported as modI, currently requiring CGA  for gait with SPC . Gait assessed with SPC demonstrated some unsteadiness in gait with wide BOS. Pt able to ambulate 250 feet with light verbal cueing. Pt educated on DME use and the importance of diet and exercise.Clinical impression: patient presents with moderate mobility limitations.. Recommend skilled PT to address safety, mobility, and discharge planning.         If plan is discharge home, recommend the following: A little help with bathing/dressing/bathroom;Assistance with cooking/housework;Assist for transportation;Direct supervision/assist for medications management;Supervision due to cognitive status;A little help with walking and/or transfers   Can travel by private vehicle        Equipment Recommendations  Other (comment)    Recommendations for Other Services       Precautions / Restrictions Restrictions Weight Bearing Restrictions Per Provider Order: No     Mobility  Bed Mobility                    Transfers Overall transfer level: Needs assistance Equipment used: None Transfers: Sit to/from Stand Sit to Stand: Contact guard assist                Ambulation/Gait Ambulation/Gait assistance: Contact guard assist Gait Distance (Feet): 250 Feet Assistive device: Straight cane Gait Pattern/deviations: Decreased dorsiflexion - right,  Trunk flexed, Step-to pattern, Decreased dorsiflexion - left       General Gait Details: CGA for safey.   Stairs             Wheelchair Mobility     Tilt Bed    Modified Rankin (Stroke Patients Only)       Balance Overall balance assessment: Needs assistance Sitting-balance support: Feet supported Sitting balance-Leahy Scale: Normal     Standing balance support: Single extremity supported, During functional activity Standing balance-Leahy Scale: Fair Standing balance comment: able to stand with occassional need for UE assistance for balance                            Communication Communication Communication: Impaired Factors Affecting Communication: Difficulty expressing self;Reduced clarity of speech  Cognition Arousal: Alert Behavior During Therapy: WFL for tasks assessed/performed   PT - Cognitive impairments: No apparent impairments                       PT - Cognition Comments: pt is pleasant and agreeable to PT session Following commands: Intact      Cueing Cueing Techniques: Verbal cues, Visual cues  Exercises      General Comments General comments (skin integrity, edema, etc.): educated provided on wide BOS for safety and balance, DME use to optimize support, recommendation for life long exercise and dietary changes      Pertinent Vitals/Pain Pain Assessment Pain Assessment: No/denies pain    Home Living Family/patient expects to be discharged to:: Private residence Living Arrangements: Spouse/significant other Available Help  at Discharge: Available 24 hours/day Type of Home: House Home Access: Level entry       Home Layout: One level Home Equipment: Agricultural consultant (2 wheels);Cane - single point      Prior Function            PT Goals (current goals can now be found in the care plan section) Acute Rehab PT Goals Patient Stated Goal: to get better and return to volunteering PT Goal Formulation: With  patient Time For Goal Achievement: 08/16/24    Frequency    Min 2X/week      PT Plan      Co-evaluation              AM-PAC PT 6 Clicks Mobility   Outcome Measure  Help needed turning from your back to your side while in a flat bed without using bedrails?: None Help needed moving from lying on your back to sitting on the side of a flat bed without using bedrails?: None Help needed moving to and from a bed to a chair (including a wheelchair)?: A Little Help needed standing up from a chair using your arms (e.g., wheelchair or bedside chair)?: A Little Help needed to walk in hospital room?: A Little Help needed climbing 3-5 steps with a railing? : A Lot 6 Click Score: 19    End of Session Equipment Utilized During Treatment: Gait belt Activity Tolerance: Patient tolerated treatment well Patient left: with family/visitor present;in bed Nurse Communication: Mobility status PT Visit Diagnosis: Unsteadiness on feet (R26.81);Muscle weakness (generalized) (M62.81);Other symptoms and signs involving the nervous system (R29.898)     Time: 8969-8957 PT Time Calculation (min) (ACUTE ONLY): 12 min  Charges:    $Therapeutic Activity: 8-22 mins PT General Charges $$ ACUTE PT VISIT: 1 Visit                     Shannon Lesches DPT, PT     Shannon Chung 08/04/2024, 11:20 AM

## 2024-08-05 DIAGNOSIS — R2990 Unspecified symptoms and signs involving the nervous system: Secondary | ICD-10-CM | POA: Diagnosis not present

## 2024-08-05 MED ORDER — ACETAMINOPHEN 325 MG PO TABS
650.0000 mg | ORAL_TABLET | Freq: Four times a day (QID) | ORAL | Status: DC | PRN
  Administered 2024-08-05: 650 mg via ORAL
  Filled 2024-08-05: qty 2

## 2024-08-05 NOTE — Progress Notes (Signed)
 Mobility Specialist - Progress Note    08/05/24 1128  Mobility  Activity Ambulated with assistance  Level of Assistance Standby assist, set-up cues, supervision of patient - no hands on  Assistive Device Front wheel walker  Distance Ambulated (ft) 160 ft  Range of Motion/Exercises Active  Activity Response Tolerated well  Mobility Referral Yes  Mobility visit 1 Mobility  Mobility Specialist Start Time (ACUTE ONLY) 1112  Mobility Specialist Stop Time (ACUTE ONLY) 1125  Mobility Specialist Time Calculation (min) (ACUTE ONLY) 13 min   Pt resting in bed on RA upon entry. Pt STS and ambulates to hallway around NS SBA with RW. Pt given verban cuing to keep head raised and step closer to the walker. Pt has trouble slightly picking up left foot. Pt endorses no pain or dizziness. Pt returned to recliner and left with need in reach. Chair alarm activated.   Guido Rumble Mobility Specialist 08/05/24, 11:32 AM

## 2024-08-05 NOTE — Plan of Care (Signed)
  Problem: Education: Goal: Knowledge of disease or condition will improve 08/05/2024 1732 by Joshua Kast, RN Outcome: Progressing 08/05/2024 1732 by Joshua Kast, RN Outcome: Progressing Goal: Knowledge of secondary prevention will improve (MUST DOCUMENT ALL) 08/05/2024 1732 by Joshua Kast, RN Outcome: Progressing 08/05/2024 1732 by Joshua Kast, RN Outcome: Progressing Goal: Knowledge of patient specific risk factors will improve (DELETE if not current risk factor) 08/05/2024 1732 by Joshua Kast, RN Outcome: Progressing 08/05/2024 1732 by Joshua Kast, RN Outcome: Progressing   Problem: Ischemic Stroke/TIA Tissue Perfusion: Goal: Complications of ischemic stroke/TIA will be minimized 08/05/2024 1732 by Joshua Kast, RN Outcome: Progressing 08/05/2024 1732 by Joshua Kast, RN Outcome: Progressing   Problem: Coping: Goal: Will verbalize positive feelings about self 08/05/2024 1732 by Joshua Kast, RN Outcome: Progressing 08/05/2024 1732 by Joshua Kast, RN Outcome: Progressing Goal: Will identify appropriate support needs 08/05/2024 1732 by Joshua Kast, RN Outcome: Progressing 08/05/2024 1732 by Joshua Kast, RN Outcome: Progressing   Problem: Health Behavior/Discharge Planning: Goal: Ability to manage health-related needs will improve 08/05/2024 1732 by Joshua Kast, RN Outcome: Progressing 08/05/2024 1732 by Joshua Kast, RN Outcome: Progressing Goal: Goals will be collaboratively established with patient/family 08/05/2024 1732 by Joshua Kast, RN Outcome: Progressing 08/05/2024 1732 by Joshua Kast, RN Outcome: Progressing   Problem: Self-Care: Goal: Ability to participate in self-care as condition permits will improve 08/05/2024 1732 by Joshua Kast, RN Outcome: Progressing 08/05/2024 1732 by Joshua Kast, RN Outcome: Progressing Goal: Verbalization of feelings and concerns over difficulty with  self-care will improve 08/05/2024 1732 by Joshua Kast, RN Outcome: Progressing 08/05/2024 1732 by Joshua Kast, RN Outcome: Progressing Goal: Ability to communicate needs accurately will improve 08/05/2024 1732 by Joshua Kast, RN Outcome: Progressing 08/05/2024 1732 by Joshua Kast, RN Outcome: Progressing   Problem: Nutrition: Goal: Risk of aspiration will decrease 08/05/2024 1732 by Joshua Kast, RN Outcome: Progressing 08/05/2024 1732 by Joshua Kast, RN Outcome: Progressing Goal: Dietary intake will improve 08/05/2024 1732 by Joshua Kast, RN Outcome: Progressing 08/05/2024 1732 by Joshua Kast, RN Outcome: Progressing   Problem: Education: Goal: Knowledge of General Education information will improve Description: Including pain rating scale, medication(s)/side effects and non-pharmacologic comfort measures 08/05/2024 1732 by Joshua Kast, RN Outcome: Progressing 08/05/2024 1732 by Joshua Kast, RN Outcome: Progressing   Problem: Health Behavior/Discharge Planning: Goal: Ability to manage health-related needs will improve Outcome: Progressing   Problem: Clinical Measurements: Goal: Ability to maintain clinical measurements within normal limits will improve Outcome: Progressing Goal: Will remain free from infection Outcome: Progressing Goal: Diagnostic test results will improve Outcome: Progressing Goal: Respiratory complications will improve Outcome: Progressing Goal: Cardiovascular complication will be avoided Outcome: Progressing   Problem: Activity: Goal: Risk for activity intolerance will decrease Outcome: Progressing   Problem: Nutrition: Goal: Adequate nutrition will be maintained Outcome: Progressing   Problem: Coping: Goal: Level of anxiety will decrease Outcome: Progressing   Problem: Elimination: Goal: Will not experience complications related to bowel motility Outcome: Progressing Goal: Will not experience  complications related to urinary retention Outcome: Progressing   Problem: Pain Managment: Goal: General experience of comfort will improve and/or be controlled Outcome: Progressing   Problem: Safety: Goal: Ability to remain free from injury will improve Outcome: Progressing   Problem: Skin Integrity: Goal: Risk for impaired skin integrity will decrease Outcome: Progressing

## 2024-08-05 NOTE — Progress Notes (Signed)
 The patient Pulse was 37 , patient is A*O 4 informed on-call provider  Ordered ECG   Completed it   Cathy,Rn

## 2024-08-05 NOTE — Plan of Care (Signed)

## 2024-08-05 NOTE — TOC Progression Note (Signed)
 Transition of Care [facility]) - Progression Note    Patient Details  Name: Shannon Chung MRN: [id number] Date of Birth: 1941-05-11  [facility]) CM/SW Contact  [place] Jenaya Saar, [place] Phone Number: 08/05/2024, 12:49 PM  Clinical Narrative:      CSW followed up with patient. Georgi, patients fiance was at bedside. CSW discussed DME needs. Mardel advised that she purchased a cane and it will be delivered tomorrow by [employer]. Patient and significant other advised that they do not need a BSC. Mardel advised that they recently renovated the bathrooms in the home and they installed raised toilets. They also removed the tub and installed a walk in shower.   Patient confirmed the need for a RW. Order placed with [facility]. [facility] services was discussed. Patient declined choice. Based on insurance availability, [facility], is available to provide PT/OT and RN.   Start of care will be on Monday.                    Expected Discharge Plan and Services                                               Social Drivers of Health (SDOH) Interventions SDOH Screenings   Food Insecurity: No Food Insecurity (08/01/2024)  Housing: Low Risk  (08/01/2024)  Transportation Needs: No Transportation Needs (08/01/2024)  Utilities: Not At Risk (08/01/2024)  Alcohol Screen: Low Risk  (12/23/2023)  Depression (PHQ2-9): Low Risk  (06/28/2024)  Financial Resource Strain: Medium Risk (06/27/2024)  Physical Activity: Sufficiently Active (06/27/2024)  Social Connections: Socially Integrated (08/01/2024)  Stress: No Stress Concern Present (06/27/2024)  Tobacco Use: Medium Risk (08/01/2024)  Health Literacy: Adequate Health Literacy (05/28/2023)    Readmission Risk Interventions     No data to display

## 2024-08-05 NOTE — Progress Notes (Addendum)
 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  Patient presents this admission with difficulty speaking and increased balance issues.  On arrival to the ED labs and vitals were unremarkable.  Code stroke was activated.  Head CT was without acute findings.  CTA head and neck with and without LVO.  Neurology was consulted.  Brain MRI performed which shows acute left hemispheric strokes in a watershed pattern.  Patient stabilized and is ultimately ready for discharge but stay was prolonged pending IPR placement.  Insurance authorization was sought but ultimately denied despite peer to peer.  Subjective: Reported bradycardia overnight while patient was sleeping.  He reports he was asymptomatic.  EKG with multiple PACs.  Patient denies any shortness of breath or chest pain.  This morning he reports he feels well, he has no acute issues or concerns.  We discussed insurance denial of IPR.  The patient and his partner both endorse concerns about discharging home given his unsteadiness and need for contact-guard assist 24/7.  We discussed modifications to make in the home to allow for greater safety.  His partner reports she will need today to make these arrangements and will be ready for him to come home tomorrow.  Objective: Vitals:   08/05/24 0452 08/05/24 0808 08/05/24 1152 08/05/24 1233  BP: 114/75 123/79 91/68 114/80  Pulse: (!) 39 78 68   Resp:  18 14   Temp:  98 F (36.7 C) 98.7 F (37.1 C)   TempSrc:  Oral    SpO2: 100% 94% 98%   Weight:       Height:        Intake/Output Summary (Last 24 hours) at 08/05/2024 1235 Last data filed at 08/05/2024 1046 Gross per 24 hour  Intake 360 ml  Output 650 ml  Net -290 ml   Filed Weights   08/01/24 1804 08/01/24 2013  Weight: 90.4 kg 86.7 kg    Examination: General exam: Appears calm and comfortable, NAD  Respiratory system: No work of breathing, symmetric chest wall expansion Cardiovascular system: S1 & S2 heard, RRR.  Gastrointestinal system: Abdomen is nondistended, soft and nontender.  Neuro: Alert, oriented.  Word-finding difficulty has resolved.  No evidence of aphasia on exam today Extremities: Symmetric, expected ROM Skin: No rashes, lesions Psychiatry: Demonstrates appropriate judgement and insight. Mood & affect appropriate for situation.   Assessment & Plan:  Principal Problem:   Stroke-like symptoms    Acute ischemic infarct Aphasia - Brain MRI with watershed pattern in the left hemisphere.  Likely secondary to large vessel disease. - A1c and LDL at goal - Continue aspirin , statin, Plavix  - Echo with preserved EF, LV has new regional wall motion abnormalities.  Bubble study negative.   - Neurology consulted - Continue Keppra  at home dose - Home health PT/OT/RN ordered.  Walker ordered.  Coronary artery stenosis, status post left stent - Discussed current stent with vascular surgery.  Patent on CT.  Vascular surgery sees no need for acute intervention at this time.  Continue outpatient follow-up - Continue with aspirin  Plavix   History of CAD status post  5 vessel CABG - Continue aspirin , statin, Plavix , Zetia  - Patient follows closely with Dr. Perla outpatient.  Has had recent echocardiogram - Echo this admission shows preserved EF 55 to 60% but left ventricle does show new wall motion abnormalities.  Patient denies recent anginal symptoms.  No chest pain or shortness of breath currently. -Reviewed this echo with cardiology who compared it to 2024 echo and  reports findings are consistent with 2024.  No acute workup needed.  This is chronic and not new finding - Will plan to follow-up in cardiology clinic  PACs - Questionable bradycardia overnight on telemetry.  EKG with PACs.  Repeat EKG this a.m. with occasional PAC.  Rate is stable, patient remains asymptomatic.  He reports extensive history of this finding.  No acute intervention necessary - Continue to normalize electrolytes - Echocardiogram as above - Cardiology clinic follow-up  Diabetes - Hemoglobin A1c 6.6%, resume metformin  at discharge - No indication for insulin  at this time  Hypertension - Due to monitor closely.  BP currently at goal.   DVT prophylaxis: Lovenox    Code Status: Full Code Disposition: Patient has been denied for IPR, completed peer to peer on 10/3 without success.  Patient will be discharging home tomorrow with home health.  Consultants:    Procedures:    Antimicrobials:  Anti-infectives (From admission, onward)    None       Data Reviewed: I have personally reviewed following labs and imaging studies CBC: Recent Labs  Lab 08/01/24 1528  WBC 7.8  NEUTROABS 5.0  HGB 14.2  HCT 42.3  MCV 97.2  PLT 178   Basic Metabolic Panel: Recent Labs  Lab 08/01/24 1528 08/02/24 0025  NA 140 139  K 4.1 3.9  CL 103 104  CO2 25 25  GLUCOSE 121* 108*  BUN 18 15  CREATININE 1.08 0.97  CALCIUM  9.3 9.0  MG  --  2.1   GFR: Estimated Creatinine Clearance: 68.6 mL/min (by C-G formula based on SCr of 0.97 mg/dL). Liver Function Tests: Recent Labs  Lab 08/01/24 1528  AST 21  ALT 14  ALKPHOS 49  BILITOT 0.9  PROT 7.5  ALBUMIN  4.3   CBG: Recent Labs  Lab 08/01/24 1525  GLUCAP 114*    No results found for this or any previous visit (from the past 240 hours).   Radiology Studies: No results found.   Scheduled Meds:  aspirin  EC  162 mg Oral Daily   atorvastatin   40 mg Oral Daily   clopidogrel   75 mg Oral Daily   enoxaparin   (LOVENOX ) injection  40 mg Subcutaneous Q24H   ezetimibe   10 mg Oral Daily   levETIRAcetam   500 mg Oral BID   Continuous Infusions:   LOS: 3 days  MDM: Patient is high risk for one or more organ failure.  They necessitate ongoing hospitalization for continued IV therapies and subsequent lab monitoring. Total time spent interpreting labs and vitals, reviewing the medical record, coordinating care amongst consultants and care team members, directly assessing and discussing care with the patient and/or family: 55 min  Walker Paddack, DO [facility]  To contact the attending physician between 7A-7P please use Epic Chat. To contact the covering physician during after hours 7P-7A, please review Amion.  08/05/2024, 12:35 PM   *This document has been created with the assistance of dictation software. Please excuse typographical errors. *

## 2024-08-05 NOTE — Discharge Instructions (Signed)
 Rolling Walker ordered through [employer] and will be delivered to the home.   [facility] set up for physical therapy, occupational therapy and nursing. Start of care will be on Monday. You will receive a call to schedule the initial assessment within 24 hours.

## 2024-08-06 DIAGNOSIS — R2990 Unspecified symptoms and signs involving the nervous system: Secondary | ICD-10-CM | POA: Diagnosis not present

## 2024-08-06 MED ORDER — INFLUENZA VAC SPLIT HIGH-DOSE 0.5 ML IM SUSY
0.5000 mL | PREFILLED_SYRINGE | Freq: Once | INTRAMUSCULAR | Status: AC
Start: 2024-08-06 — End: 2024-08-06
  Administered 2024-08-06: 0.5 mL via INTRAMUSCULAR
  Filled 2024-08-06: qty 0.5

## 2024-08-06 NOTE — Progress Notes (Signed)
 Physical Therapy Treatment Patient Details Name: Shannon Chung MRN: [id number] DOB: 05-28-1941 Today's Date: 08/06/2024   History of Present Illness Pt is an 83 y.o. male presenting with word finding and balance difficulties. 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 PMH significant for CAD s/p CABGX5, CVA, dyslipidemia, HTN, hx of stereotypic spells of speech disturbance with EEG showing left hemispheric cortical dysfunction    PT Comments  Pt ready for session.  Stated he prefers and plans to use RW upon discharge.  Will have access to both RW and [facility] but is encouraged to use RW until cleared by next disposition for [facility] use.  He walks x 2 laps with RW and cga/supervision with verbal cues to inc step height and length.  He continues with short choppy steps but is aware of walker position and makes a good effort to step up into walker.  Steps are improved with frequent cues but does revert back to a more shuffle gait when cues are not given.  While gait is generally unsteady, he has no LOB's or buckling that requires physical intervention.  Stated he feels his gait is returning to baseline and is comfortable with discharge home.  Will update recommendations to reflect.   If plan is discharge home, recommend the following: A little help with bathing/dressing/bathroom;Assistance with cooking/housework;Assist for transportation;Direct supervision/assist for medications management;Supervision due to cognitive status;A little help with walking and/or transfers   Can travel by private vehicle        Equipment Recommendations       Recommendations for Other Services       Precautions / Restrictions Precautions Precautions: Fall Recall of Precautions/Restrictions: Intact Restrictions Weight Bearing Restrictions Per Provider Order: No     Mobility  Bed Mobility Overal bed mobility: Modified Independent       Supine to sit: Used rails, HOB  elevated       Patient Response: Cooperative  Transfers Overall transfer level: Needs assistance Equipment used: None Transfers: Sit to/from Stand Sit to Stand: Supervision                Ambulation/Gait Ambulation/Gait assistance: Contact guard assist Gait Distance (Feet): 320 Feet Assistive device: Rolling walker (2 wheels) Gait Pattern/deviations: Decreased dorsiflexion - right, Trunk flexed, Decreased dorsiflexion - left, Decreased step length - right, Decreased step length - left, Step-through pattern, Shuffle Gait velocity: dec     General Gait Details: CGA for safey., cues to inc steph height and length   Stairs             Wheelchair Mobility     Tilt Bed Tilt Bed Patient Response: Cooperative  Modified Rankin (Stroke Patients Only)       Balance Overall balance assessment: Needs assistance   Sitting balance-Leahy Scale: Normal     Standing balance support: Bilateral upper extremity supported Standing balance-Leahy Scale: Fair                              Hotel manager: No apparent difficulties  Cognition Arousal: Alert Behavior During Therapy: WFL for tasks assessed/performed   PT - Cognitive impairments: No apparent impairments                       PT - Cognition Comments: pt is pleasant and agreeable to PT session Following commands: Intact      Cueing Cueing Techniques: Verbal cues, Visual cues  Exercises      General Comments        Pertinent Vitals/Pain Pain Assessment Pain Assessment: No/denies pain    Home Living                          Prior Function            PT Goals (current goals can now be found in the care plan section) Progress towards PT goals: Progressing toward goals    Frequency    Min 2X/week      PT Plan      Co-evaluation              AM-PAC PT 6 Clicks Mobility   Outcome Measure  Help needed turning from your  back to your side while in a flat bed without using bedrails?: None Help needed moving from lying on your back to sitting on the side of a flat bed without using bedrails?: None Help needed moving to and from a bed to a chair (including a wheelchair)?: A Little Help needed standing up from a chair using your arms (e.g., wheelchair or bedside chair)?: A Little Help needed to walk in hospital room?: A Little Help needed climbing 3-5 steps with a railing? : A Lot 6 Click Score: 19    End of Session Equipment Utilized During Treatment: Gait belt Activity Tolerance: Patient tolerated treatment well Patient left: in chair;with chair alarm set;with call bell/phone within reach Nurse Communication: Mobility status PT Visit Diagnosis: Unsteadiness on feet (R26.81);Muscle weakness (generalized) (M62.81);Other symptoms and signs involving the nervous system (R29.898)     Time: 9169-9161 PT Time Calculation (min) (ACUTE ONLY): 8 min  Charges:    $Gait Training: 8-22 mins PT General Charges $$ ACUTE PT VISIT: 1 Visit                   Lauraine Gills, PTA 08/06/24, 8:51 AM

## 2024-08-06 NOTE — Progress Notes (Signed)
 Per Dr Marquette Sites, dc tele monitoring

## 2024-08-06 NOTE — Progress Notes (Signed)
 Per Dr Marquette Sites, dc NIH/stroke orders

## 2024-08-06 NOTE — Plan of Care (Signed)
 Problem: Education: Goal: Knowledge of disease or condition will improve 08/06/2024 0510 by Pola Bridegroom, RN Outcome: Progressing 08/06/2024 0412 by Pola Bridegroom, RN Outcome: Progressing Goal: Knowledge of secondary prevention will improve (MUST DOCUMENT ALL) 08/06/2024 0510 by Pola Bridegroom, RN Outcome: Progressing 08/06/2024 0412 by Pola Bridegroom, RN Outcome: Progressing Goal: Knowledge of patient specific risk factors will improve (DELETE if not current risk factor) 08/06/2024 0510 by Pola Bridegroom, RN Outcome: Progressing 08/06/2024 0412 by Pola Bridegroom, RN Outcome: Progressing   Problem: Ischemic Stroke/TIA Tissue Perfusion: Goal: Complications of ischemic stroke/TIA will be minimized 08/06/2024 0510 by Pola Bridegroom, RN Outcome: Progressing 08/06/2024 0412 by Pola Bridegroom, RN Outcome: Progressing   Problem: Coping: Goal: Will verbalize positive feelings about self 08/06/2024 0510 by Pola Bridegroom, RN Outcome: Progressing 08/06/2024 0412 by Pola Bridegroom, RN Outcome: Progressing Goal: Will identify appropriate support needs 08/06/2024 0510 by Pola Bridegroom, RN Outcome: Progressing 08/06/2024 0412 by Pola Bridegroom, RN Outcome: Progressing   Problem: Health Behavior/Discharge Planning: Goal: Ability to manage health-related needs will improve 08/06/2024 0510 by Pola Bridegroom, RN Outcome: Progressing 08/06/2024 0412 by Pola Bridegroom, RN Outcome: Progressing Goal: Goals will be collaboratively established with patient/family 08/06/2024 0510 by Pola Bridegroom, RN Outcome: Progressing 08/06/2024 0412 by Pola Bridegroom, RN Outcome: Progressing   Problem: Self-Care: Goal: Ability to participate in self-care as condition permits will improve 08/06/2024 0510 by Pola Bridegroom, RN Outcome: Progressing 08/06/2024 0412 by Pola Bridegroom, RN Outcome: Progressing Goal: Verbalization of feelings and concerns over difficulty with self-care will improve 08/06/2024 0510 by Pola Bridegroom, RN Outcome: Progressing 08/06/2024 0412 by Pola Bridegroom,  RN Outcome: Progressing Goal: Ability to communicate needs accurately will improve 08/06/2024 0510 by Pola Bridegroom, RN Outcome: Progressing 08/06/2024 0412 by Pola Bridegroom, RN Outcome: Progressing   Problem: Nutrition: Goal: Risk of aspiration will decrease 08/06/2024 0510 by Pola Bridegroom, RN Outcome: Progressing 08/06/2024 0412 by Pola Bridegroom, RN Outcome: Progressing Goal: Dietary intake will improve 08/06/2024 0510 by Pola Bridegroom, RN Outcome: Progressing 08/06/2024 0412 by Pola Bridegroom, RN Outcome: Progressing   Problem: Education: Goal: Knowledge of General Education information will improve Description: Including pain rating scale, medication(s)/side effects and non-pharmacologic comfort measures 08/06/2024 0510 by Pola Bridegroom, RN Outcome: Progressing 08/06/2024 0412 by Pola Bridegroom, RN Outcome: Progressing   Problem: Health Behavior/Discharge Planning: Goal: Ability to manage health-related needs will improve 08/06/2024 0510 by Pola Bridegroom, RN Outcome: Progressing 08/06/2024 0412 by Pola Bridegroom, RN Outcome: Progressing   Problem: Clinical Measurements: Goal: Ability to maintain clinical measurements within normal limits will improve 08/06/2024 0510 by Pola Bridegroom, RN Outcome: Progressing 08/06/2024 0412 by Pola Bridegroom, RN Outcome: Progressing Goal: Will remain free from infection 08/06/2024 0510 by Pola Bridegroom, RN Outcome: Progressing 08/06/2024 0412 by Pola Bridegroom, RN Outcome: Progressing Goal: Diagnostic test results will improve 08/06/2024 0510 by Pola Bridegroom, RN Outcome: Progressing 08/06/2024 0412 by Pola Bridegroom, RN Outcome: Progressing Goal: Respiratory complications will improve 08/06/2024 0510 by Pola Bridegroom, RN Outcome: Progressing 08/06/2024 0412 by Pola Bridegroom, RN Outcome: Progressing Goal: Cardiovascular complication will be avoided 08/06/2024 0510 by Pola Bridegroom, RN Outcome: Progressing 08/06/2024 0412 by Pola Bridegroom, RN Outcome: Progressing   Problem: Activity: Goal: Risk for  activity intolerance will decrease 08/06/2024 0510 by Pola Bridegroom, RN Outcome: Progressing 08/06/2024 0412 by Pola Bridegroom, RN Outcome: Progressing   Problem: Nutrition: Goal: Adequate nutrition will be maintained 08/06/2024 0510 by Pola Bridegroom, RN Outcome: Progressing 08/06/2024 0412 by Pola Bridegroom, RN Outcome: Progressing   Problem: Coping: Goal: Level of anxiety will decrease  08/06/2024 0510 by Pola Bridegroom, RN Outcome: Progressing 08/06/2024 0412 by Pola Bridegroom, RN Outcome: Progressing   Problem: Elimination: Goal: Will not experience complications related to bowel motility 08/06/2024 0510 by Pola Bridegroom, RN Outcome: Progressing 08/06/2024 0412 by Pola Bridegroom, RN Outcome: Progressing Goal: Will not experience complications related to urinary retention 08/06/2024 0510 by Pola Bridegroom, RN Outcome: Progressing 08/06/2024 0412 by Pola Bridegroom, RN Outcome: Progressing   Problem: Pain Managment: Goal: General experience of comfort will improve and/or be controlled 08/06/2024 0510 by Pola Bridegroom, RN Outcome: Progressing 08/06/2024 0412 by Pola Bridegroom, RN Outcome: Progressing   Problem: Safety: Goal: Ability to remain free from injury will improve 08/06/2024 0510 by Pola Bridegroom, RN Outcome: Progressing 08/06/2024 0412 by Pola Bridegroom, RN Outcome: Progressing   Problem: Skin Integrity: Goal: Risk for impaired skin integrity will decrease 08/06/2024 0510 by Pola Bridegroom, RN Outcome: Progressing 08/06/2024 0412 by Pola Bridegroom, RN Outcome: Progressing

## 2024-08-06 NOTE — Discharge Summary (Addendum)
 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  Patient presents this admission with difficulty speaking and increased balance issues.  On arrival to the ED labs and vitals were unremarkable.  Code stroke was activated.  Head CT was without acute findings.  CTA head and neck with and without LVO.  Neurology was consulted.  Brain MRI performed which shows acute left hemispheric strokes in a watershed pattern.  Patient stabilized with significant improvement in his aphasia but persistent gait instability. Stay was prolonged pending IPR placement.  Insurance authorization was sought but ultimately denied despite peer to peer.  Home health DME and PT/OT/RN was arranged prior to DC.  Patient is discharging home on 10/5 with plans to follow-up with his primary care and cardiology team. On day of discharge care plan was discussed with the patient as well as with his partner, Mardel, at bedside.  Acute ischemic infarct Aphasia - Brain MRI with watershed pattern in the left hemisphere.  Likely secondary to large vessel disease. - A1c and LDL at goal - Continue aspirin , statin, Plavix  - Echo with preserved EF, LV has new regional wall motion abnormalities.  Bubble study negative.   - Neurology consulted - Continue Keppra  at home dose - Home health PT/OT/RN ordered.   Walker ordered.   Coronary artery stenosis, status post left stent - Discussed current stent with vascular surgery.  Patent on CT.  Vascular surgery sees no need for acute intervention at this time.  Continue outpatient follow-up - Continue with aspirin  Plavix    History of CAD status post 5 vessel CABG - Continue aspirin , statin, Plavix , Zetia  - Patient follows closely with Dr. Perla outpatient.  Has had recent echocardiogram - Echo this admission shows preserved EF 55 to 60% but left ventricle does show new wall motion abnormalities.  Patient denies recent anginal symptoms.  No chest pain or shortness of breath currently. -Reviewed this echo with cardiology (Dr. Argentina) who compared it to 2024 echo and reports findings are consistent with 2024.  No acute workup needed.  This is chronic and not new finding - Will plan to follow-up in cardiology clinic  Ventricular bigeminy, intermittent PACs, occasional - Rate is stable, patient remains asymptomatic.  He reports extensive history of this finding.  No acute intervention necessary - Echocardiogram as above - Cardiology clinic follow-up   Diabetes - Hemoglobin A1c 6.6%, resume metformin  at discharge - No indication for insulin  at this time   Hypertension - Blood pressure has been normotensive/low without antihypertensives.  Pulse has been consistently in the 60s.  Holding lisinopril  and metoprolol  at discharge. - Keep blood pressure log follow-up with PCP/cardiology to discuss resuming   Discharge Instructions  Discharge Instructions     Call MD for:  difficulty breathing, headache or visual disturbances   Complete by: As directed    Call MD for:  persistant dizziness or light-headedness   Complete by: As directed    Call MD for:  persistant nausea and vomiting   Complete by: As directed    Call MD for:  severe uncontrolled pain   Complete by: As directed    Call MD for:  temperature >100.4   Complete by: As directed    Diet  general   Complete by: As directed    Discharge instructions   Complete by: As directed    While admitted your blood pressure/pulse was too low to tolerate your lisinopril  and metoprolol .  We have recommended holding these medications for now.  Please take your blood pressure once a day and keep a log. See your primary care doctor in one week to review this log and make further medication changes.   Increase activity slowly   Complete by: As directed       Allergies as of 08/06/2024       Reactions   Pollen Extract Itching        Medication List     STOP taking these medications    fluticasone  50 MCG/ACT nasal spray Commonly known as: FLONASE    lisinopril  10 MG tablet Commonly known as: ZESTRIL    metoprolol  succinate 25 MG 24 hr tablet Commonly known as: TOPROL -XL   prednisoLONE  acetate 1 % ophthalmic suspension Commonly known as: PRED FORTE    vitamin B-12 500 MCG tablet Commonly known as: CYANOCOBALAMIN    Vitamin D -3 125 MCG (5000 UT) Tabs       TAKE these medications    acetaminophen  500 MG tablet Commonly known as: TYLENOL  Take 500 mg by mouth every 6 (six) hours as needed for mild pain.   aspirin  EC 81 MG tablet Take 162 mg by mouth daily. Swallow whole.   atorvastatin  40 MG tablet Commonly known as: LIPITOR  TAKE 1 TABLET BY MOUTH DAILY   Blood Pressure Kit Check blood pressure two to three times a week   clopidogrel  75 MG tablet Commonly known as: Plavix  Take 1 tablet (75 mg total) by mouth daily.   ezetimibe  10 MG tablet Commonly known as: ZETIA  TAKE 1 TABLET BY MOUTH DAILY   famotidine  20 MG tablet Commonly known as: PEPCID  TAKE 1 TABLET BY MOUTH 2 TIMES DAILY AS NEEDED FOR HEARTBURN/INDIGESTION. D/C ZANTAC    levETIRAcetam  500 MG tablet Commonly known as: KEPPRA  Take 1 tablet (500 mg total) by mouth 2 (two) times daily.   metFORMIN  500 MG tablet Commonly known as: GLUCOPHAGE  Take 1 tablet (500 mg total) by mouth 2 (two) times daily  with a meal.   sildenafil  20 MG tablet Commonly known as: REVATIO  TAKE 1 TABLET BY MOUTH DAILY AS NEEDED               Durable Medical Equipment  (From admission, onward)           Start     Ordered   08/05/24 1227  For home use only DME 4 wheeled rolling walker with seat  Once       Question Answer Comment  Patient needs a walker to treat with the following condition CVA (cerebral vascular accident) [facility])   Patient needs a walker to treat with the following condition Balance disorder   Patient needs a walker to treat with the following condition Gait disturbance      08/05/24 1227            Allergies  Allergen Reactions   Pollen Extract Itching    Consultations:    Procedures/Studies: ECHOCARDIOGRAM COMPLETE BUBBLE STUDY Result Date: 08/02/2024    ECHOCARDIOGRAM REPORT   Patient  Name:   Shannon Chung [facility] Date of Exam: 08/02/2024 Medical Rec #:  [id number]             Height:       73.0 in Accession #:    [id number]            Weight:       191.1 lb Date of Birth:  1941-11-01             BSA:          2.111 m Patient Age:    82 years              BP:           109/66 mmHg Patient Gender: M                     HR:           62 bpm. Exam Location:  [facility] Procedure: 2D Echo, Cardiac Doppler, Color Doppler, Intracardiac Opacification            Agent and Saline Contrast Bubble Study (Both Spectral and Color Flow            Doppler were utilized during procedure). Indications:     Stroke 434.91 / I63.9  History:         Patient has prior history of Echocardiogram examinations, most                  recent 02/23/2023. CAD, Prior CABG; Stroke.  Sonographer:     Ashley McNeely-Sloane Referring Phys:  [id number] Marisol Glazer Diagnosing Phys: Evalene Lunger MD  Sonographer Comments: Technically difficult study due to poor echo windows. IMPRESSIONS  1. Left ventricular ejection fraction, by estimation, is 55 to 60%. The left ventricle has normal function. The left ventricle  demonstrates regional wall motion abnormalities (hypokinesis of the mid to distal anteroseptal and apical region). Left ventricular diastolic parameters are consistent with Grade I diastolic dysfunction (impaired relaxation).  2. Right ventricular systolic function is normal. The right ventricular size is normal.  3. The mitral valve is normal in structure. No evidence of mitral valve regurgitation. No evidence of mitral stenosis.  4. The aortic valve is tricuspid. There is mild calcification of the aortic valve. Aortic valve regurgitation is not visualized. Aortic valve sclerosis is present, with no evidence of aortic valve stenosis.  5. There is borderline dilatation of the aortic root, measuring 39 mm. There is borderline dilatation of the ascending aorta, measuring 38 mm.  6. The inferior vena cava is normal in size with greater than 50% respiratory variability, suggesting right atrial pressure of 3 mmHg.  7. Agitated saline contrast bubble study was negative, with no evidence of any interatrial shunt. FINDINGS  Left Ventricle: Left ventricular ejection fraction, by estimation, is 55 to 60%. The left ventricle has normal function. The left ventricle demonstrates regional wall motion abnormalities. Definity  contrast agent was given IV to delineate the left ventricular endocardial borders. Strain was performed and the global longitudinal strain is indeterminate. The left ventricular internal cavity size was normal in size. There is no left ventricular hypertrophy. Left ventricular diastolic parameters are consistent with Grade I diastolic dysfunction (impaired relaxation). Right Ventricle: The right ventricular size is normal. No increase in right ventricular wall thickness. Right ventricular systolic function is normal. Left Atrium: Left atrial size was normal in size. Right Atrium: Right atrial size was normal in size. Pericardium: There is no evidence of pericardial effusion. Mitral  Valve: The mitral valve is  normal in structure. No evidence of mitral valve regurgitation. No evidence of mitral valve stenosis. MV peak gradient, 4.5 mmHg. The mean mitral valve gradient is 1.0 mmHg. Tricuspid Valve: The tricuspid valve is normal in structure. Tricuspid valve regurgitation is not demonstrated. No evidence of tricuspid stenosis. Aortic Valve: The aortic valve is tricuspid. There is mild calcification of the aortic valve. Aortic valve regurgitation is not visualized. Aortic valve sclerosis is present, with no evidence of aortic valve stenosis. Aortic valve mean gradient measures 4.0 mmHg. Aortic valve peak gradient measures 7.6 mmHg. Aortic valve area, by VTI measures 3.97 cm. Pulmonic Valve: The pulmonic valve was normal in structure. Pulmonic valve regurgitation is not visualized. No evidence of pulmonic stenosis. Aorta: The aortic root is normal in size and structure. There is borderline dilatation of the aortic root, measuring 39 mm. There is borderline dilatation of the ascending aorta, measuring 38 mm. Venous: The inferior vena cava is normal in size with greater than 50% respiratory variability, suggesting right atrial pressure of 3 mmHg. IAS/Shunts: No atrial level shunt detected by color flow Doppler. Agitated saline contrast was given intravenously to evaluate for intracardiac shunting. Agitated saline contrast bubble study was negative, with no evidence of any interatrial shunt. There  is no evidence of a patent foramen ovale. There is no evidence of an atrial septal defect. Additional Comments: 3D was performed not requiring image post processing on an independent workstation and was indeterminate.  LEFT VENTRICLE PLAX 2D LVIDd:         5.30 cm   Diastology LVIDs:         4.00 cm   LV e' medial:    6.67 cm/s LV PW:         1.00 cm   LV E/e' medial:  7.9 LV IVS:        0.90 cm   LV e' lateral:   7.33 cm/s LVOT diam:     2.40 cm   LV E/e' lateral: 7.2 LV SV:         88 LV SV Index:   42 LVOT Area:     4.52 cm   RIGHT VENTRICLE RV S prime:     10.80 cm/s TAPSE (M-mode): 1.1 cm LEFT ATRIUM             Index        RIGHT ATRIUM           Index LA diam:        3.20 cm 1.52 cm/m   RA Area:     20.00 cm LA Vol (A2C):   34.6 ml 16.39 ml/m  RA Volume:   59.10 ml  28.00 ml/m LA Vol (A4C):   17.0 ml 8.05 ml/m LA Biplane Vol: 26.4 ml 12.51 ml/m  AORTIC VALVE                    PULMONIC VALVE AV Area (Vmax):    3.47 cm     PV Vmax:        1.02 m/s AV Area (Vmean):   3.25 cm     PV Vmean:       70.600 cm/s AV Area (VTI):     3.97 cm     PV VTI:         0.213 m AV Vmax:           138.00 cm/s  PV Peak grad:   4.2 mmHg AV Vmean:  96.300 cm/s  PV Mean grad:   2.0 mmHg AV VTI:            0.221 m      RVOT Peak grad: 1 mmHg AV Peak Grad:      7.6 mmHg AV Mean Grad:      4.0 mmHg LVOT Vmax:         106.00 cm/s LVOT Vmean:        69.200 cm/s LVOT VTI:          0.194 m LVOT/AV VTI ratio: 0.88  AORTA Ao Root diam: 3.90 cm Ao Asc diam:  3.80 cm MITRAL VALVE MV Area (PHT): 2.32 cm    SHUNTS MV Area VTI:   3.20 cm    Systemic VTI:  0.19 m MV Peak grad:  4.5 mmHg    Systemic Diam: 2.40 cm MV Mean grad:  1.0 mmHg    Pulmonic VTI:  0.152 m MV Vmax:       1.06 m/s MV Vmean:      46.7 cm/s MV Decel Time: 327 msec MV E velocity: 53.00 cm/s MV A velocity: 95.30 cm/s MV E/A ratio:  0.56 Evalene Lunger MD Electronically signed by Evalene Lunger MD Signature Date/Time: 08/02/2024/4:24:15 PM    Final    MR BRAIN WO CONTRAST Result Date: 08/01/2024 CLINICAL DATA:  Initial evaluation for acute neuro deficit, stroke suspected. EXAM: MRI HEAD WITHOUT CONTRAST TECHNIQUE: Multiplanar, multiecho pulse sequences of the brain and surrounding structures were obtained without intravenous contrast. COMPARISON:  CTs from earlier the same day. FINDINGS: Brain: Generalized age-related cerebral atrophy. Patchy T2/FLAIR hyperintensity involving the supratentorial cerebral white matter, consistent with chronic small vessel ischemic disease, mild in nature.  Few small remote lacunar infarcts present about the left greater than right basal ganglia. Encephalomalacia gliosis at the posterior left frontoparietal region near the vertex, consistent with prior infarcts. Scattered patchy small volume foci of restricted diffusion seen involving the cortical subcortical left frontal, parietal, and temporal lobes, left MCA distribution, consistent with acute ischemic infarcts. These are superimposed on the underlying chronic left frontoparietal infarct. These are somewhat watershed in distribution. Patchy involvement of the basal ganglia noted as well. No associated hemorrhage or mass effect. No other evidence for acute or subacute ischemia. No acute intracranial hemorrhage. Single punctate chronic microhemorrhage noted at the left frontal centrum semi ovale, of doubtful significance in isolation. No mass lesion, midline shift or mass effect no hydrocephalus or extra-axial fluid collection. Partially empty sella noted. Vascular: Major intracranial vessel flow voids are maintained. Skull and upper cervical spine: Cranial junction with normal limits. Bone marrow signal intensity normal. No scalp soft tissue abnormality. Sinuses/Orbits: Prior bilateral ocular lens replacement. Few small retention cysts noted about the maxillary sinuses. Paranasal sinuses are otherwise largely clear. Small bilateral mastoid effusions noted, of doubtful significance. Image nasopharynx unremarkable. Other: None. IMPRESSION: 1. Scattered patchy small volume acute ischemic nonhemorrhagic left MCA distribution infarcts as above. No associated mass effect. 2. Underlying chronic left frontoparietal infarct, with additional small remote lacunar infarcts about the left greater than right basal ganglia. 3. Underlying age-related cerebral atrophy with mild chronic small vessel ischemic disease. Electronically Signed   By: Morene Hoard M.D.   On: 08/01/2024 22:28   DG Chest Port 1 View Result Date:  08/01/2024 EXAM: 1 VIEW(S) XRAY OF THE CHEST 08/01/2024 08:26:00 PM COMPARISON: 04/15/2021 CLINICAL HISTORY: [id number] Stroke-like symptoms [id number]. The reason for exam is stated as stroke-like symptoms. Hx of diabetes, HTN, prostate cancer, cardiac cath,  and former smoker. FINDINGS: LINES, TUBES AND DEVICES: Loop recorder is present. LUNGS AND PLEURA: No focal pulmonary opacity. No pulmonary edema. No pleural effusion. No pneumothorax. HEART AND MEDIASTINUM: Stable cardiomediastinal silhouette. Aortic atherosclerotic calcification. BONES AND SOFT TISSUES: Sternotomy wires are present. IMPRESSION: 1. No acute process. Electronically signed by: Norman Gatlin MD 08/01/2024 08:28 PM EDT RP Workstation: [id number]   CT ANGIO HEAD NECK W WO CM W PERF (CODE STROKE) Result Date: 08/01/2024 EXAM: CTA Head and Neck with Perfusion 08/01/2024 03:45:33 PM TECHNIQUE: CTA of the head and neck was performed with and without the administration of 100 mL of iohexol  (OMNIPAQUE ) 350 MG/ML injection. 3D postprocessing with multiplanar reconstructions and MIPs was performed to evaluate the vascular anatomy. Cerebral perfusion analysis using computed tomography with contrast administration, including post-processing of parametric maps with determination of cerebral blood flow, cerebral blood volume, mean transit time and time-to-maximum. Automated exposure control, iterative reconstruction, and/or weight based adjustment of the mA/kV was utilized to reduce the radiation dose to as low as reasonably achievable. COMPARISON: CT head and CTA head and neck dated 04/26/2023. CLINICAL HISTORY: Neuro deficit, acute, stroke suspected. Code stroke Dr. Voncile [phone]. FINDINGS: CTA NECK: AORTIC ARCH AND ARCH VESSELS: Moderate atherosclerosis of the visualized aortic arch. Atherosclerosis involving the proximal aspect of both subclavian arteries without high grade stenosis. No dissection or arterial injury. CERVICAL CAROTID ARTERIES: Right:  Mixed atherosclerotic plaque along the distal right common carotid artery resulting in mild stenosis. Additional mixed atherosclerotic plaque at the right carotid bifurcation extending into the proximal right cervical ICA resulting in approximately 60% stenosis of the proximal right cervical ICA. There is additional stenosis at the origin of the right external carotid artery. Left: Mixed atherosclerotic plaque in the left common carotid artery. Stent extending from the distal left common carotid artery into the proximal left cervical ICA. There is slightly limited evaluation although the visualized portions of the stent appear patent and the vessel is patent proximal and distal to the stent. No evidence of hemodynamically significant stenosis of the left cervical ICA. No dissection or arterial injury. CERVICAL VERTEBRAL ARTERIES: The left vertebral artery is dominant. Atherosclerosis at the left vertebral artery origin resulting in moderate stenosis. Additional multifocal atherosclerosis of the V2 and V4 segments of the left vertebral artery. No dissection or arterial injury. LUNGS AND MEDIASTINUM: Medial sternotomy. Multiple nodules in the right thyroid  lobe measuring up to 1.1 cm without findings to suggest additional required follow up. SOFT TISSUES: No acute abnormality. BONES: Degenerative changes throughout the visualized spine. Disc space narrowing most pronounced at C5-C6 and C6-C7. No acute abnormality. CTA HEAD: ANTERIOR CIRCULATION: The intracranial internal carotid arteries are patent bilaterally. Atherosclerosis of the bilateral carotid siphons. There is mild stenosis of the right cavernous ICA. Additional mild to moderate stenosis of the left cavernous and paraclinoid ICA. The middle cerebral arteries are patent bilaterally. The anterior cerebral arteries are patent bilaterally. No aneurysm. POSTERIOR CIRCULATION: No significant stenosis of the posterior cerebral arteries. No significant stenosis of  the basilar artery. No significant stenosis of the vertebral arteries. No aneurysm. OTHER: No dural venous sinus thrombosis on this non-dedicated study. CT PERFUSION: EXAM QUALITY: Exam quality is adequate with diagnostic perfusion maps. No significant motion artifact. Appropriate arterial inflow and venous outflow curves. CORE INFARCT (CBF<30% volume): 0 mL TOTAL HYPOPERFUSION (Tmax>6s volume): 0 mL PENUMBRA: Mismatch volume: 0 mL Mismatch ratio: not applicable Location: not applicable IMPRESSION: 1. No acute large vessel occlusion. 2. No evidence of ischemia by CT brain  perfusion. 3. Stent extending from the distal left common carotid artery into the proximal left cervical ICA. The vessel is patent proximal and distal to the stent. 4. Mixed atherosclerotic plaque resulting in approximately 60% stenosis of the proximal right cervical ICA. 5. Atherosclerosis at the left vertebral artery origin resulting in moderate stenosis. 6. Atherosclerosis of the bilateral carotid siphons with mild stenosis of the right cavernous ICA and mild to moderate stenosis of the left cavernous and paraclinoid ICA. Electronically signed by: Donnice Mania MD 08/01/2024 04:13 PM EDT RP Workstation: [id number]   CT HEAD CODE STROKE WO CONTRAST Result Date: 08/01/2024 EXAM: CT HEAD WITHOUT CONTRAST 08/01/2024 03:32:31 PM TECHNIQUE: CT of the head was performed without the administration of intravenous contrast. Automated exposure control, iterative reconstruction, and/or weight based adjustment of the mA/kV was utilized to reduce the radiation dose to as low as reasonably achievable. COMPARISON: MRI head 05/02/2023. CLINICAL HISTORY: Neuro deficit, acute, stroke suspected. Code stroke. Dr. Arora [phone]. FINDINGS: BRAIN AND VENTRICLES: No acute hemorrhage. No evidence of acute infarct. Encephalomalacia in the left frontoparietal lobes near the vertex compatible with remote infarcts. There is signal abnormality in the central aspect of  the pons which may be artifactual versus related to remote infarcts. Mild parenchymal volume loss. Additional remote infarct in the left caudate. Atherosclerosis of the carotid siphons and intracranial vertebral arteries. No hydrocephalus. No extra-axial collection. No mass effect or midline shift. ORBITS: Bilateral lens replacement. No acute abnormality. SINUSES: No acute abnormality. SOFT TISSUES AND SKULL: Small right mastoid effusion. No acute soft tissue abnormality. No skull fracture. [place] stroke program early CT (aspect) score: Ganglionic (caudate, ic, Lentiform Nucleus, insula, M1-m3): 7 Supraganglionic (m4-m6): 3 Total: 10 IMPRESSION: 1. No acute intracranial abnormality. 2. Encephalomalacia in the left frontoparietal lobes near the vertex compatible with remote infarcts. 3. Signal abnormality in the central aspect of the pons, possibly artifactual or related to remote infarct. 4. Additional remote infarct in the left caudate. 5. Mild parenchymal volume loss. 6. Findings messaged to Dr. Arora at 3:46PM on 08/01/24. Electronically signed by: Donnice Mania MD 08/01/2024 03:47 PM EDT RP Workstation: [id number]   CUP PACEART REMOTE DEVICE CHECK Result Date: 07/24/2024 ILR summary report received. Battery status OK. Normal device function. No new symptom, tachy, brady, or pause episodes. No new AF episodes. PAC ectopy noted on presenting EGM. Monthly summary reports and ROV/PRN. MC, CVRS     Discharge Exam: Vitals:   08/06/24 0315 08/06/24 0805  BP: 129/83 125/79  Pulse: 68   Resp:  14  Temp: (!) 97.5 F (36.4 C) 98.7 F (37.1 C)  SpO2: 96% 95%   Vitals:   08/05/24 1558 08/05/24 2004 08/06/24 0315 08/06/24 0805  BP: 139/77 124/85 129/83 125/79  Pulse:  (!) 52 68   Resp: 14   14  Temp: 97.8 F (36.6 C) 98 F (36.7 C) (!) 97.5 F (36.4 C) 98.7 F (37.1 C)  TempSrc:  Oral Oral   SpO2: 97% 97% 96% 95%  Weight:      Height:        Constitutional:  Normal appearance. Non  toxic-appearing.  HENT: Head Normocephalic and atraumatic.  Mucous membranes are moist.  Eyes:  Extraocular intact. Conjunctivae normal.  Cardiovascular: Rate and Rhythm: Normal rate and regular rhythm.  Pulmonary: Non labored, symmetric rise of chest wall.  Skin: warm and dry. not jaundiced.  Neurological: No focal deficit present. alert. Oriented.  Psychiatric: Mood and Affect congruent.    The results of significant diagnostics  from this hospitalization (including imaging, microbiology, ancillary and laboratory) are listed below for reference.     Microbiology: No results found for this or any previous visit (from the past 240 hours).   Labs: BNP (last 3 results) No results for input(s): BNP in the last 8760 hours. Basic Metabolic Panel: Recent Labs  Lab 08/01/24 1528 08/02/24 0025  NA 140 139  K 4.1 3.9  CL 103 104  CO2 25 25  GLUCOSE 121* 108*  BUN 18 15  CREATININE 1.08 0.97  CALCIUM  9.3 9.0  MG  --  2.1   Liver Function Tests: Recent Labs  Lab 08/01/24 1528  AST 21  ALT 14  ALKPHOS 49  BILITOT 0.9  PROT 7.5  ALBUMIN  4.3   No results for input(s): LIPASE, AMYLASE in the last 168 hours. No results for input(s): AMMONIA in the last 168 hours. CBC: Recent Labs  Lab 08/01/24 1528  WBC 7.8  NEUTROABS 5.0  HGB 14.2  HCT 42.3  MCV 97.2  PLT 178   Cardiac Enzymes: No results for input(s): CKTOTAL, CKMB, CKMBINDEX, TROPONINI in the last 168 hours. BNP: Invalid input(s): POCBNP CBG: Recent Labs  Lab 08/01/24 1525  GLUCAP 114*   D-Dimer No results for input(s): DDIMER in the last 72 hours. Hgb A1c No results for input(s): HGBA1C in the last 72 hours. Lipid Profile No results for input(s): CHOL, HDL, LDLCALC, TRIG, CHOLHDL, LDLDIRECT in the last 72 hours. Thyroid  function studies No results for input(s): TSH, T4TOTAL, T3FREE, THYROIDAB in the last 72 hours.  Invalid input(s): FREET3 Anemia work up No  results for input(s): VITAMINB12, FOLATE, FERRITIN, TIBC, IRON, RETICCTPCT in the last 72 hours. Urinalysis    Component Value Date/Time   COLORURINE YELLOW (A) 08/01/2024 2340   APPEARANCEUR CLEAR (A) 08/01/2024 2340   LABSPEC 1.035 (H) 08/01/2024 2340   PHURINE 5.0 08/01/2024 2340   GLUCOSEU NEGATIVE 08/01/2024 2340   GLUCOSEU NEGATIVE 02/28/2018 0833   HGBUR NEGATIVE 08/01/2024 2340   BILIRUBINUR NEGATIVE 08/01/2024 2340   KETONESUR NEGATIVE 08/01/2024 2340   PROTEINUR NEGATIVE 08/01/2024 2340   UROBILINOGEN 0.2 02/28/2018 0833   NITRITE NEGATIVE 08/01/2024 2340   LEUKOCYTESUR NEGATIVE 08/01/2024 2340   Sepsis Labs Recent Labs  Lab 08/01/24 1528  WBC 7.8   Microbiology No results found for this or any previous visit (from the past 240 hours).   Time coordinating discharge: 32 min    SIGNED: Kizzy Olafson, DO [facility] 08/06/2024, 11:50 AM Pager   If 7PM-7AM, please contact night-coverage

## 2024-08-07 ENCOUNTER — Telehealth: Payer: Self-pay

## 2024-08-07 NOTE — Progress Notes (Signed)
 Remote Loop Recorder Transmission

## 2024-08-07 NOTE — Transitions of Care (Post Inpatient/ED Visit) (Signed)
   08/07/2024  Name: Shannon Chung MRN: [id number] DOB: 12/23/1940  Today's TOC FU Call Status: Today's TOC FU Call Status:: Unsuccessful Call (1st Attempt) Unsuccessful Call (1st Attempt) Date: 08/07/24  Attempted to reach the patient regarding the most recent Inpatient/ED visit.  Follow Up Plan: Additional outreach attempts will be made to reach the patient to complete the Transitions of Care (Post Inpatient/ED visit) call.   Signature   Avelina Essex, CMA (AAMA)  [facility] [phone]

## 2024-08-09 ENCOUNTER — Ambulatory Visit: Admitting: Internal Medicine

## 2024-08-09 ENCOUNTER — Encounter: Payer: Self-pay | Admitting: Internal Medicine

## 2024-08-09 VITALS — BP 102/58 | HR 61 | Temp 97.7°F | Ht 73.0 in | Wt 190.0 lb

## 2024-08-09 DIAGNOSIS — M549 Dorsalgia, unspecified: Secondary | ICD-10-CM | POA: Insufficient documentation

## 2024-08-09 DIAGNOSIS — M546 Pain in thoracic spine: Secondary | ICD-10-CM | POA: Diagnosis not present

## 2024-08-09 DIAGNOSIS — Z7984 Long term (current) use of oral hypoglycemic drugs: Secondary | ICD-10-CM

## 2024-08-09 DIAGNOSIS — I152 Hypertension secondary to endocrine disorders: Secondary | ICD-10-CM

## 2024-08-09 DIAGNOSIS — E1159 Type 2 diabetes mellitus with other circulatory complications: Secondary | ICD-10-CM | POA: Diagnosis not present

## 2024-08-09 DIAGNOSIS — I69351 Hemiplegia and hemiparesis following cerebral infarction affecting right dominant side: Secondary | ICD-10-CM

## 2024-08-09 DIAGNOSIS — E118 Type 2 diabetes mellitus with unspecified complications: Secondary | ICD-10-CM

## 2024-08-09 NOTE — Progress Notes (Signed)
 TOC received a call from the patient's friend Gareld advising the patient has not received the RW. TOC reviewed the medical record and the RW was ordered from [employer] on Sunday 10/5. TOC placed a call to [employer] and they have been unable to make contact to secure the copay and deliver the equipment. TOC reiterated they should call Geri. TOC placed call to Geri to provide her with the update. [employer] will deliver the equipment today.

## 2024-08-09 NOTE — Assessment & Plan Note (Signed)
-   This problem is chronic and stable -Will continue with metformin  500 mg twice a day -Last A1c was last month and was 6.6 -No further workup at this time

## 2024-08-09 NOTE — Patient Instructions (Addendum)
  VISIT SUMMARY: You were seen today for follow-up after your recent stroke. We discussed your ongoing difficulties with walking and speech, musculoskeletal pain, blood pressure management, and other health concerns.  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 Your speech has improved significantly, but you still have some weakness in your right leg. It is important to start your home physical and occupational therapy as scheduled. Please contact the home health company to confirm your therapy appointments. Continue taking aspirin  and Plavix  as prescribed. Follow up with a neurologist for further care.  -MUSCULOSKELETAL PAIN OF UPPER BACK AND SHOULDERS DUE TO DECONDITIONING: Your upper back and shoulder pain is likely due to muscle weakness from being in bed for a long time during your hospital stay. Use warm compresses to help relax your muscles. Avoid muscle relaxants as they may make you unsteady on your feet. Inform your physical therapist about the pain so they can include specific exercises to help. You can also use topical treatments like Bengay or Biofreeze for relief.  -LOW BLOOD PRESSURE (BORDERLINE HYPOTENSION): Your blood pressure is currently on the lower side, so we are holding off on your blood pressure medications for now. Monitor your blood pressure closely and avoid taking sildenafil  as it can lower your blood pressure further.  -PREMATURE VENTRICULAR CONTRACTIONS: You have a history of extra heartbeats, but you are not currently experiencing any palpitations. We are prioritizing your blood pressure management over this issue for now. Please follow up with your cardiologist for further evaluation.  -TYPE 2 DIABETES MELLITUS: Your diabetes is being managed with metformin . Continue taking your medication as prescribed.  -HYPERLIPIDEMIA: Your  high cholesterol is being managed with Lipitor  and Zetia . Continue taking your medications as prescribed.  INSTRUCTIONS: Please ensure that your home physical and occupational therapy start as scheduled. Contact the home health company to confirm your therapy appointments. Follow up with a neurologist and a cardiologist for further evaluation. Monitor your blood pressure closely and avoid taking sildenafil .                        Contains text generated by [employer]bridge.                                 Contains text generated by [employer]bridge.

## 2024-08-09 NOTE — Progress Notes (Signed)
 Acute Office Visit  Subjective:     Patient ID: Shannon Chung, male    DOB: [birth year], 83 y.o.   MRN: [id number]  Chief Complaint  Patient presents with   Hospitalization Follow-up   Discussed the use of AI scribe software for clinical note transcription with the patient, who gave verbal consent to proceed.  History of Present Illness Shannon Chung is an 83 year old male with a recent stroke who presents with difficulty walking and speech issues.  Neurological deficits post-cerebrovascular accident - Eight days ago, experienced a left-sided brain infarct confirmed by MRI - Hospitalized for stroke and discharged on Sunday - Difficulty walking since the stroke, with a 'little bit of drag' in the right leg - Speech impairment since the stroke, with significant improvement noted - No recent palpitations  Musculoskeletal pain - Significant pain in shoulders and upper back, severe at times but not constant - Pain developed after being mostly bedridden for eight days during hospitalization - Pain is severe enough to make driving difficult  Rehabilitation and functional status - Initially recommended for inpatient rehabilitation, but insurance denied request - Awaiting start of home-based physical and occupational therapy, which has been delayed - Physical therapy scheduled to begin tomorrow, occupational therapy on Friday, but no contact yet from therapists - 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  Medications were reviewed with the patient today and he is compliant with all his discharge medications     Review  of Systems  Constitutional: Negative.   HENT: Negative.    Respiratory: Negative.    Cardiovascular: Negative.  Negative for palpitations and leg swelling.  Gastrointestinal: Negative.   Musculoskeletal:  Positive for back pain and myalgias.  Neurological:  Positive for speech change and focal weakness. Negative for sensory change and headaches.  Psychiatric/Behavioral: Negative.          Objective:    BP (!) 102/58   Pulse 61   Temp 97.7 F (36.5 C)   Ht 6' 1 (1.854 m)   Wt 190 lb (86.2 kg)   SpO2 96%   BMI 25.07 kg/m    Physical Exam Constitutional:      Appearance: Normal appearance.  HENT:     Head: Normocephalic and atraumatic.  Cardiovascular:     Rate and Rhythm: Normal rate and regular rhythm.     Heart sounds: Normal heart sounds.  Pulmonary:     Effort: Pulmonary effort is normal.     Breath sounds: Normal breath sounds. No wheezing, rhonchi or rales.  Abdominal:     General: Bowel sounds are normal. There is no distension.     Palpations: Abdomen is soft.     Tenderness: There is no abdominal tenderness. There is no guarding or rebound.  Musculoskeletal:        General: No swelling or tenderness.     Right lower leg: No edema.     Left lower leg: No edema.  Skin:    Comments: No rash or ecchymosis noted over left back or shoulder  Neurological:     Mental Status: He is alert.     Comments: Power  is 5 out of 5 in bilateral upper extremities and left lower extremity but 4 out of 5 in the right lower extremity.  Weakness is worse at the hip flexors SCM strength is normal bilaterally.  Tongue is central.  No facial deviation noted.  Shoulder shrug intact.  Speech appears to be normal. Sensation is intact   Psychiatric:        Mood and Affect: Mood normal.        Behavior: Behavior normal.     No results found for any visits on 08/09/24.      Assessment & Plan:   Problem List Items Addressed This Visit       Cardiovascular and Mediastinum    Hypertension associated with diabetes (HCC)   - Patient has a history of hypertension on lisinopril  and metoprolol  -However, on his recent hospitalization was noted to have lower blood pressures and these medications were held -Blood pressure today is 102/58 off of lisinopril  and metoprolol  -Will continue to hold these medications for now -Patient has a follow-up appointment with his cardiologist at the end of this month.  They can recheck blood pressure at that time and see if we need to resume any of these medications -No further workup at this time        Endocrine   Type 2 diabetes mellitus with complications (HCC) - Primary   - This problem is chronic and stable -Will continue with metformin  500 mg twice a day -Last A1c was last month and was 6.6 -No further workup at this time        Nervous and Auditory   Hemiparesis affecting right side as late effect of cerebrovascular accident (CVA) (HCC)   - Patient was recently admitted to the hospital with an acute left-sided CVA with speech impairment and right-sided hemiparesis -Patient's speech has improved and is close to his baseline -However, patient has persistent right lower extremity weakness and has difficulty ambulating by himself even with the assistance of a walker -He was initially supposed to be discharged to inpatient rehab but insurance did not approve -He is awaiting PT (scheduled to start tomorrow) and OT (scheduled start on Friday) to be done at home -Continue with Lipitor  40 mg daily, Zetia  10 mg daily as well as aspirin  and Plavix  -Will hold antihypertensive secondary to low normal blood pressures (102/58 today) -Patient follow-up with neurology in 8 to 12 weeks -No further workup at this time -        Other   Back pain   - Patient complains of pain over his left back and shoulder since discharge from the hospital.  This pain occurs intermittently -I suspect this is likely secondary to laying on his back in the  hospital for prolonged periods of time -On exam, no ecchymosis or rash noted -Will not prescribe muscle relaxants at this time given that he is a high fall risk -PT to evaluate the patient tomorrow.  I asked him to let the physical therapist know about the pain so that he can get some exercises to help with the muscles in his back -Can use warm compresses as well as over-the-counter topical treatments including Biofreeze or Bengay -No further workup at this time       No orders of the defined types were placed in this encounter.   No follow-ups on file.  Desman Polak, MD

## 2024-08-09 NOTE — Assessment & Plan Note (Signed)
-   Patient has a history of hypertension on lisinopril  and metoprolol  -However, on his recent hospitalization was noted to have lower blood pressures and these medications were held -Blood pressure today is 102/58 off of lisinopril  and metoprolol  -Will continue to hold these medications for now -Patient has a follow-up appointment with his cardiologist at the end of this month.  They can recheck blood pressure at that time and see if we need to resume any of these medications -No further workup at this time

## 2024-08-09 NOTE — Assessment & Plan Note (Signed)
-   Patient was recently admitted to the hospital with an acute left-sided CVA with speech impairment and right-sided hemiparesis -Patient's speech has improved and is close to his baseline -However, patient has persistent right lower extremity weakness and has difficulty ambulating by himself even with the assistance of a walker -He was initially supposed to be discharged to inpatient rehab but insurance did not approve -He is awaiting PT (scheduled to start tomorrow) and OT (scheduled start on Friday) to be done at home -Continue with Lipitor  40 mg daily, Zetia  10 mg daily as well as aspirin  and Plavix  -Will hold antihypertensive secondary to low normal blood pressures (102/58 today) -Patient follow-up with neurology in 8 to 12 weeks -No further workup at this time -

## 2024-08-09 NOTE — Assessment & Plan Note (Signed)
-   Patient complains of pain over his left back and shoulder since discharge from the hospital.  This pain occurs intermittently -I suspect this is likely secondary to laying on his back in the hospital for prolonged periods of time -On exam, no ecchymosis or rash noted -Will not prescribe muscle relaxants at this time given that he is a high fall risk -PT to evaluate the patient tomorrow.  I asked him to let the physical therapist know about the pain so that he can get some exercises to help with the muscles in his back -Can use warm compresses as well as over-the-counter topical treatments including Biofreeze or Bengay -No further workup at this time

## 2024-08-10 ENCOUNTER — Telehealth: Payer: Self-pay

## 2024-08-10 NOTE — Telephone Encounter (Signed)
 Received paper work from [employer] and the [employer]. Placed in folder for signature

## 2024-08-11 NOTE — Telephone Encounter (Unsigned)
 Copied from CRM [id number]. Topic: Referral - Request for Referral >> Aug 11, 2024 12:55 PM Franky GRADE wrote: Did the patient discuss referral with their provider in the last year? Yes (If No - schedule appointment) (If Yes - send message)  Appointment offered? Type of order/referral and detailed reason for visit: Physical Therapy after stroke. Patient is doing PT at home but has improved so much and believes it would be best to be completed at the outpatient facility.   Preference of office, provider, location: [facility]  If referral order, have you been seen by this specialty before? Yes (If Yes, this issue or another issue? When? Where?  Can we respond through MyChart? Yes

## 2024-08-11 NOTE — Telephone Encounter (Unsigned)
 Copied from CRM [id number]. Topic: General - Other >> Aug 10, 2024  5:18 PM [place] J wrote: Reason for CRM: Patient has been notified of the received paperwork from the [employer] and [employer]. He has no questions at this time. He would like to let kacy know that he is doing well.

## 2024-08-15 ENCOUNTER — Other Ambulatory Visit: Payer: Self-pay | Admitting: Nurse Practitioner

## 2024-08-15 ENCOUNTER — Telehealth: Payer: Self-pay

## 2024-08-15 DIAGNOSIS — Z8673 Personal history of transient ischemic attack (TIA), and cerebral infarction without residual deficits: Secondary | ICD-10-CM

## 2024-08-15 NOTE — Telephone Encounter (Signed)
 Copied from CRM [id number]. Topic: General - Other >> Aug 14, 2024  4:56 PM Drema MATSU wrote: Reason for CRM: [facility] did evaluation as request and stated that patient is not in need of OT services at this time.

## 2024-08-17 ENCOUNTER — Ambulatory Visit: Attending: Medical | Admitting: Medical

## 2024-08-17 ENCOUNTER — Encounter: Payer: Self-pay | Admitting: Medical

## 2024-08-17 VITALS — BP 130/66 | HR 79 | Ht 72.0 in | Wt 189.2 lb

## 2024-08-17 DIAGNOSIS — I255 Ischemic cardiomyopathy: Secondary | ICD-10-CM

## 2024-08-17 DIAGNOSIS — I639 Cerebral infarction, unspecified: Secondary | ICD-10-CM | POA: Diagnosis not present

## 2024-08-17 DIAGNOSIS — I10 Essential (primary) hypertension: Secondary | ICD-10-CM

## 2024-08-17 DIAGNOSIS — I6523 Occlusion and stenosis of bilateral carotid arteries: Secondary | ICD-10-CM

## 2024-08-17 DIAGNOSIS — I2510 Atherosclerotic heart disease of native coronary artery without angina pectoris: Secondary | ICD-10-CM | POA: Diagnosis not present

## 2024-08-17 NOTE — Progress Notes (Signed)
 Cardiology Office Note   Date:  08/18/2024  ID:  Arun Herrod, DOB 1941-06-22, MRN [id number] PCP: Gretel App, NP   HeartCare Providers Cardiologist:  Evalene Lunger, MD Electrophysiologist:  OLE ONEIDA HOLTS, MD   History of Present Illness Shannon Chung is a 83 y.o. male with a h/o CAD s/p CABG x5 in 2016, bilateral carotid stenosis, LV apical thrombus, ICM, frequent PVCs, HTN, HLD, carotid artery endarterectomy s/p L carotid endarterectomy, CVA, s/p ILR 06/2023, aortic atherosclerosis, DM2 who presents for hospital follow-up.    The patient was first evaluated by Dr. Gollan 01/2015 to establish care after moving to the area. He reported h/o ICM with EF 35%, Cardiac PET showing scar in the apical and periapical region, mural thrombus on anticoagulation in July 2015. He did not have a LHC at that time. Dr. Gollan ordered a coronary CT which showed a calcium  score of 1103 and most calcium  in p-m LAD. LHC in September 2016 showed severe multivessel and LM disease so he was referred to CT surgery. He underwent CABG x5 in 07/2015. Most recent echo 11/2015 showed LVEF 55-60%, mild LVH, G1DD.    The patient underwent stroke work-up 02/2023 notable for 70% left carotid stenosis and negative MRI. She subsequently had left carotid endarterectomy and pos-op on 6/24 had expressive aphasia with word salad. She had recurrent episodes and presented to [facility] for each of them. She saw EP and underwent ILR implant 06/15/23.  Patient was last seen 03/08/2024 and was overall doing well from a cardiac perspective.  No A-fib detected.  Patient was admitted early October 2025 for acute ischemic infarct likely secondary to large vessel disease. Echo showed preserved EF, negative bubble study. He was continued on aspirin , statin, Plavix .  Today, the patient is overall OK. Wife says neurologically he is not back to normal, she reports persistent memory issues. They will see neurologist in November.  He is not having and weakness, numbness, or tingling. Appetite is good. He is doing in-home PT and OT. BP was low in the hospital and lisinopril  and toprol  were stopped.   Studies Reviewed EKG Interpretation Date/Time:  Thursday August 17 2024 14:04:01 EDT Ventricular Rate:  79 PR Interval:  140 QRS Duration:  90 QT Interval:  382 QTC Calculation: 438 R Axis:   -3  Text Interpretation: Normal sinus rhythm with sinus arrhythmia Nonspecific T wave abnormality When compared with ECG of 05-Aug-2024 09:52, Abberant conduction is no longer Present Criteria for Septal infarct are no longer Present T wave inversion no longer evident in Inferior leads Nonspecific T wave abnormality, worse in Lateral leads Confirmed by Franchester, Dejaun Vidrio ([id number]) on 08/17/2024 2:06:48 PM        Echo 08/02/24  1. Left ventricular ejection fraction, by estimation, is 55 to 60%. The  left ventricle has normal function. The left ventricle demonstrates  regional wall motion abnormalities (hypokinesis of the mid to distal  anteroseptal and apical region). Left  ventricular diastolic parameters are consistent with Grade I diastolic  dysfunction (impaired relaxation).   2. Right ventricular systolic function is normal. The right ventricular  size is normal.   3. The mitral valve is normal in structure. No evidence of mitral valve  regurgitation. No evidence of mitral stenosis.   4. The aortic valve is tricuspid. There is mild calcification of the  aortic valve. Aortic valve regurgitation is not visualized. Aortic valve  sclerosis is present, with no evidence of aortic valve stenosis.   5. There is borderline  dilatation of the aortic root, measuring 39 mm.  There is borderline dilatation of the ascending aorta, measuring 38 mm.   6. The inferior vena cava is normal in size with greater than 50%  respiratory variability, suggesting right atrial pressure of 3 mmHg.   7. Agitated saline contrast bubble study was negative, with  no evidence  of any interatrial shunt.   Echo 02/2023 1. Left ventricular ejection fraction, by estimation, is >55%. The left  ventricle has normal function. Left ventricular endocardial border not  optimally defined to evaluate regional wall motion. There is mild left  ventricular hypertrophy. Left  ventricular diastolic parameters are consistent with Grade I diastolic  dysfunction (impaired relaxation).   2. Right ventricular systolic function was not well visualized. The right  ventricular size is normal. Tricuspid regurgitation signal is inadequate  for assessing PA pressure.   3. The mitral valve is grossly normal. No evidence of mitral valve  regurgitation. No evidence of mitral stenosis.   4. The aortic valve is tricuspid. There is mild thickening of the aortic  valve. Aortic valve regurgitation is not visualized. Aortic valve  sclerosis is present, with no evidence of aortic valve stenosis.   LHC 2016 LM lesion, 70% stenosed. Mid LAD-1 lesion, 100% stenosed. Mid LAD-2 lesion, 90% stenosed. 2nd Diag lesion, 95% stenosed. Prox LAD lesion, 80% stenosed. Ramus lesion, 75% stenosed. 1st Diag lesion, 70% stenosed. Lat 1st Diag lesion, 70% stenosed. Prox RCA to Mid RCA lesion, 100% stenosed. There is moderate left ventricular systolic dysfunction.   Physical Exam VS:  BP 130/66 (BP Location: Left Arm, Patient Position: Sitting, Cuff Size: Normal)   Pulse 79   Ht 6' (1.829 m)   Wt 189 lb 4 oz (85.8 kg)   SpO2 95%   BMI 25.67 kg/m        Wt Readings from Last 3 Encounters:  08/17/24 189 lb 4 oz (85.8 kg)  08/09/24 190 lb (86.2 kg)  08/01/24 191 lb 2.2 oz (86.7 kg)    GEN: Well nourished, well developed in no acute distress NECK: No JVD; No carotid bruits CARDIAC: RRR, no murmurs, rubs, gallops RESPIRATORY:  Clear to auscultation without rales, wheezing or rhonchi  ABDOMEN: Soft, non-tender, non-distended EXTREMITIES:  No edema; No deformity   ASSESSMENT AND  PLAN  Recurrent CVA s/p ILR Patient with h/o TIA, now recently admitted for CVA/acute ischemic stroke, suspected secondary to large vessel disease. No device interrogation noted. He has one scheduled for 10/24. Echo showed normal LVEF and negative bubble study. Continue ASA, PLAvix , Lipitor , Zetia . Will notify MD.  CAD s/p CABG x 5 2016 He denies anginal symptoms.  Continue aspirin , Plavix , Lipitor , Zetia .  Lisinopril  metoprolol  held during hospitalization for hypotension  Hyperlipidemia LDL 45.  Continue Lipitor  40 mg daily and Zetia  10 mg daily  HTN Blood pressure is good. Patient was hypotensive during the hospitalization and lisinopril  and Toprol  were stopped.   H/o ICM Echo in 2017 showed LVEF 55-60%. The patient is euvolemic on exam. Recent echo showed normal LVEF 55-60%, G1DD, mild aortic valve calcification. The patient is euvolemic on exam. Lisinopril  and Toprol  held as above.   Carotid artery disease s/p stent 2024 Carotid US  07/2023 showed 1-39% right ICA and patent left carotid stent. He follows with vascular surgery. Consider repeat US .        Dispo: Follow-up in 3 months  Signed, Anjel Perfetti VEAR Fishman, PA-C

## 2024-08-17 NOTE — Patient Instructions (Signed)
 Medication Instructions:  Your physician recommends that you continue on your current medications as directed. Please refer to the Current Medication list given to you today.   *If you need a refill on your cardiac medications before your next appointment, please call your pharmacy*  Lab Work: None ordered at this time   Follow-Up: At [facility], you and your health needs are our priority.  As part of our continuing mission to provide you with exceptional heart care, our providers are all part of one team.  This team includes your primary Cardiologist (physician) and Advanced Practice Providers or APPs (Physician Assistants and Nurse Practitioners) who all work together to provide you with the care you need, when you need it.  Your next appointment:   3 month(s)  Provider:   You may see Timothy Gollan, MD or Bernardino Bring, PA-C

## 2024-08-21 ENCOUNTER — Telehealth: Payer: Self-pay

## 2024-08-21 NOTE — Telephone Encounter (Signed)
 2 [facility] home health orders placed in provider to be signed folder

## 2024-08-21 NOTE — Therapy (Addendum)
 OUTPATIENT PHYSICAL THERAPY NEURO EVALUATION   Patient Name: Shannon Chung MRN: [id number] DOB:09-08-1941, 83 y.o., male Today's Date: 08/22/2024   PCP: Gretel App, NP  REFERRING PROVIDER: Gretel App, NP   END OF SESSION:  PT End of Session - 08/22/24 1354     Visit Number 1   Number of Visits 24        Progress Note Due on Visit 10   PT Start Time 1400    PT Stop Time 1441    PT Time Calculation (min) 41 min    Equipment Utilized During Treatment Gait belt    Activity Tolerance Patient tolerated treatment well    Behavior During Therapy WFL for tasks assessed/performed          Past Medical History:  Diagnosis Date   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 06/2015 Cardiac CT: Ca score 1103 (84th %'ile);  b. 07/2015 Cath: LM 70, LAD 80p, 100/30m, D1 70, D2 95, RI 75, RCA 100p/m;  c. 07/2015 CABG x 5 (LIMA->LAD, VG->Diag, VG->OM1->OM2, VG->OM3).   COVID-19    12/2021   COVID-19 01/18/2022   COVID-19 vaccine administered 01/18/2022   Unknown how many vaccine doses have been received. Entered from Emergency Triage Note.   Diabetes mellitus without complication (HCC) 07/2015   Dyslipidemia    Essential hypertension    Essential hypertension 01/02/2015   Formatting of this note might be different from the original.  Last Assessment & Plan:   Chronic, stable. Continue current regimen.   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 dilated, EF 35% improved to 45-50% (2015);  b. 07/2015 EF 25-35% by LV gram.   Ischemic cardiomyopathy 01/02/2015   Kidney stones 04/17/2021   Lone atrial fibrillation (HCC) 1983   a. isolated episode, not on OAC.   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 06/2014: LV; resolved with coumadin-->no residual on f/u echo, no longer on coumadin.   Mural thrombus of heart 08/26/2021   Formatting of this note might be different from the original. Jun 16, 2019 Entered By: HICKS,KRISTIN DENISE  Comment: left ventricle, cardiac apex   Osteoarthritis    a. 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 Jun 16, 2019 Entered By: VINIE ALLEAN AQUAS Comment: leftAug 19, 2020 Entered By: VINIE ALLEAN AQUAS Comment: resolved by total left knee replacement Jun 16, 2019 Entered By: VINIE ALLEAN AQUAS Comment: leftAug 19, 2020 Entered By: VINIE ALLEAN AQUAS Comment: resolved by total left knee replacement   Trigger finger of left hand 07/07/2019   Vitamin D  deficiency    Past Surgical History:  Procedure Laterality Date   BICEPS TENDON REPAIR Right 1993   CARDIAC CATHETERIZATION N/A 07/05/2015   Procedure: Left Heart Cath and Coronary Angiography;  Surgeon: Evalene JINNY Lunger, MD;  Location: [facility] INVASIVE CV LAB;  Service: Cardiovascular;  Laterality: N/A;   CAROTID PTA/STENT INTERVENTION Left 04/26/2023   Procedure: CAROTID PTA/STENT INTERVENTION;  Surgeon: Marea Selinda RAMAN, MD;  Location: [facility] INVASIVE CV LAB;  Service: Cardiovascular;  Laterality: Left;   CATARACT EXTRACTION Left 12/2016   with keratoplasty   COLONOSCOPY  2007   COLONOSCOPY WITH PROPOFOL  N/A 12/02/2017   TA, SSA, rpt 3 yrs(Tahiliani, Varnita B, MD)   COLONOSCOPY WITH PROPOFOL  N/A 11/26/2020   Procedure: COLONOSCOPY WITH PROPOFOL ;  Surgeon: Jinny Carmine, MD;  Location: [facility] ENDOSCOPY;  Service: Endoscopy;  Laterality: N/A;   CORONARY ARTERY BYPASS GRAFT N/A 07/29/2015   Procedure: CORONARY ARTERY BYPASS GRAFTING (CABG) x 5 (LIMA to LAD, SVG to DIAGONAL,  SVG SEQUENTIALLY to OM1 and OM2, SVG to OM3) with Endoscopic Vein Havesting of  GREATER SAPHENOUS VEIN from RIGHT THIGH and partial LOWER LEG ;  Surgeon: Dorise MARLA Fellers, MD;  Location: [facility] OR;  Service: Open Heart Surgery;  Laterality: N/A;   EYE SURGERY     b/l cataract and cornea replaced    HAND SURGERY     left hand 1st/2nd trigger fingers Dr. Cleotilde ortho    JOINT REPLACEMENT     KNEE ARTHROSCOPY Left remote   MOHS SURGERY     left cheek scc 2022 Dr. Lloyd    MOHS SURGERY     x 5 facial scc   right biceps tendon     repair/re attachment    SKIN CANCER EXCISION  10/2015   BCC - L ala (pending MOHs) and L scapula (complete excision)   TEE WITHOUT CARDIOVERSION N/A 07/29/2015   Procedure: TRANSESOPHAGEAL ECHOCARDIOGRAM (TEE);  Surgeon: Dorise MARLA Fellers, MD;  Location: [facility] OR;  Service: Open Heart Surgery;  Laterality: N/A;   TONSILLECTOMY  1949   TOTAL KNEE ARTHROPLASTY Left 03/18/2016   cemented L TKR; Kayla Cleotilde, MD   Patient Active Problem List   Diagnosis Date Noted   Back pain 08/09/2024   Stroke-like symptoms 08/01/2024   Status post  placement of implantable loop recorder 07/12/2024   Seizure disorder (HCC) 06/28/2024   Annual physical exam 07/10/2023   TIA (transient ischemic attack) 05/02/2023   GERD without esophagitis 05/02/2023   Carotid stenosis, symptomatic, with infarction (HCC) 04/26/2023   History of CVA (cerebrovascular accident) without residual deficits 03/04/2023   Hemiparesis affecting right side as late effect of cerebrovascular accident (CVA) (HCC) 02/22/2023   Chronic radicular pain of lower back 08/10/2022   Cervical spondylosis 03/11/2022   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   Bilateral carotid artery stenosis 03/11/2022   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   Lumbar spondylosis 10/07/2021   DDD (degenerative disc disease), lumbar 10/07/2021   History of radiation therapy 08/26/2021   Aortic atherosclerosis 04/17/2021   Diverticulosis 04/17/2021   Hypertension associated with diabetes (HCC) 10/07/2020   Overweight (BMI 25.0-29.9) 10/07/2020   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   Vitamin D  deficiency 08/01/2020   Polyp of sigmoid colon 08/01/2020   Insomnia 06/07/2020   Degenerative joint disease of hand 03/01/2020   BPH (benign prostatic hyperplasia) 08/05/2018   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   Macular scar of both eyes 04/20/2018   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   Scotoma involving  central area of both eyes 04/20/2018   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   Fatty liver 03/25/2018   Erectile dysfunction 02/23/2017   Hx of CABG 01/24/2017   Advanced care planning/counseling discussion 10/21/2015   Coronary artery disease of native artery of native heart with stable angina pectoris    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   Dyslipidemia 01/02/2015   Osteoarthritis 01/02/2015   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/02/2014    ONSET DATE: 08/01/24  REFERRING DIAG: Z86.73 (ICD-10-CM) - History of CVA (cerebrovascular accident)   THERAPY DIAG:  Abnormality of gait and mobility  Difficulty in walking, not elsewhere classified  Other abnormalities of gait and mobility  Rationale for Evaluation and Treatment: Rehabilitation  SUBJECTIVE:                                                                                                                                                                                             SUBJECTIVE STATEMENT: Pt had recent CVA. Pt was unable to get inpatient rehab but was able to get with home health. Pt had to go through a marathon with nurses and PT for home health. Pt did end up in PT with a HHPT he appreciated and decided he should come to outpatient services. Pt feels he is improving but is not at his pre morbid level. Pt using cane at the moment and uses  transport chair to get to clinic.   Pt accompanied by: self  PERTINENT HISTORY: CVA and hospital admission on 08/01/24.PMX of with history of left carotid artery stent, CAD status post 5 vessel CABG, dyslipidemia, hypertension, history of stereotypic spells   PAIN:  Are you having pain? No  PRECAUTIONS: Fall    WEIGHT BEARING RESTRICTIONS: No  FALLS: Has patient fallen in last 6 months? No  LIVING ENVIRONMENT: Lives with: lives with their spouse Lives in: House/apartment- Duplex Stairs: No Has following  equipment at home: Single point cane and Walker - 2 wheeled  PLOF: Independent and Independent with basic ADLs  PATIENT GOALS: improve gait, improve R LE strength, improve balance and mobility.   OBJECTIVE:  Note: Objective measures were completed at Evaluation unless otherwise noted.  DIAGNOSTIC FINDINGS: 1. Scattered patchy small volume acute ischemic nonhemorrhagic left MCA distribution infarcts as above. No associated mass effect. 2. Underlying chronic left frontoparietal infarct, with additional small remote lacunar infarcts about the left greater than right basal ganglia. 3. Underlying age-related cerebral atrophy with mild chronic small vessel ischemic disease.  COGNITION: Overall cognitive status: Within functional limits for tasks assessed   SENSATION: WFL   LOWER EXTREMITY ROM:   WNL for tasks assessed    LOWER EXTREMITY MMT:    MMT Right Eval Left Eval  Hip flexion 4 4+  Hip extension    Hip abduction 4 4+  Hip adduction 4+ 4+  Hip internal rotation    Hip external rotation    Knee flexion 4 4+  Knee extension 4 4+  Ankle dorsiflexion 4 4+  Ankle plantarflexion    Ankle inversion    Ankle eversion    (Blank rows = not tested)  BED MOBILITY:  Not tested  TRANSFERS: Sit to stand: Complete Independence  Assistive device utilized: Single point cane     Stand to sit: Modified independence  Assistive device utilized: Tree Surgeon to chair: Modified independence  Assistive device utilized: Counselling psychologist       GAIT: Findings: Gait Characteristics: decreased step length- Right, decreased step length- Left, decreased stride length, and decreased hip/knee flexion- Right, Distance walked: 30 ft, and Comments:    FUNCTIONAL TESTS:  5 times sit to stand: 21.23 sec Timed up and go (TUG): test visit 2  6 minute walk test: 554 ft with CGA  BERG 44 : .58 m/s  PATIENT SURVEYS:  SIS 16: 65.6 %                                                                                                                              TREATMENT DATE: 08/22/24   SELF CARE Patient instructed in plan of care, findings for evaluation, and ways of physical therapy may improve their function and quality of life.     PATIENT EDUCATION: Education details: POC Person educated: Patient Education method: Explanation Education comprehension: verbalized understanding  HOME EXERCISE PROGRAM: Establish visit 2    SHORT TERM GOALS: Target date: 09/19/2024       Patient will be independent in home exercise program to improve strength/mobility for better functional independence with ADLs. Baseline: No HEP currently  Goal status: INITIAL   LONG TERM GOALS: Target date: 11/14/2024    1.  Patient will complete five times sit to stand test in < 15 seconds indicating an increased LE strength and improved balance. Baseline: 21.23 sec  Goal status: INITIAL  2.  Patient will improve SIS 16 score to 75   to demonstrate statistically significant improvement in mobility and quality of life as it relates to their CVA functional deficits.  Baseline: 65.6% Goal status: INITIAL   3.  Patient will increase Berg Balance score by > 6 points to demonstrate decreased fall risk during functional activities. Baseline: 44 Goal status: INITIAL   4.   Patient will reduce timed up and go to <11 seconds to reduce fall risk and demonstrate improved transfer/gait ability. Baseline: test visit 2  Goal status: INITIAL  5.   Patient will increase 10 meter walk test to >1.80m/s as to improve gait speed for better community ambulation and to reduce fall risk. Baseline: .58 m/s Goal status: INITIAL  6.   Patient will increase six minute walk test distance to >1000 for progression to community ambulator and improve gait ability Baseline: 554 ft with quad cane and CGA Goal status: INITIAL    ASSESSMENT:  CLINICAL IMPRESSION: Patient is a 83 y.o. M who  was seen today for physical therapy evaluation and treatment for mobility impairments s/p CVA. Pt shows increased risk of falls and reduced ability for community mobility based on deficits. Pt will benefit from skilled PT to address deficits, reach PT goals and improve QOL.   OBJECTIVE IMPAIRMENTS: Abnormal gait, decreased activity tolerance, decreased balance, decreased endurance, decreased mobility, difficulty walking, decreased strength, and hypomobility.   ACTIVITY LIMITATIONS: standing, squatting, stairs, transfers, and locomotion level  PARTICIPATION LIMITATIONS: shopping, community activity, and yard work  PERSONAL FACTORS: Age and 3+ comorbidities:  with history of left carotid artery stent, CAD status post 5 vessel CABG, dyslipidemia, hypertension, history of stereotypic spells are also affecting patient's functional outcome.   REHAB POTENTIAL: Good  CLINICAL DECISION MAKING: Evolving/moderate complexity  EVALUATION COMPLEXITY: Moderate  PLAN:  PT FREQUENCY: 2x/week  PT DURATION: 12 weeks  PLANNED INTERVENTIONS: 97750- Physical Performance Testing, 97110-Therapeutic exercises, 97530- Therapeutic activity, 97112- Neuromuscular re-education, 97535- Self Care, [id number]- Manual therapy, [phone]- Gait training, Patient/Family education, Balance training, and Stair training  PLAN FOR NEXT SESSION: TUG, HEP, strength and balance R side    Lonni KATHEE Gainer, PT 08/22/2024, 5:13 PM

## 2024-08-22 ENCOUNTER — Ambulatory Visit: Attending: Nurse Practitioner | Admitting: Physical Therapy

## 2024-08-22 DIAGNOSIS — Z8673 Personal history of transient ischemic attack (TIA), and cerebral infarction without residual deficits: Secondary | ICD-10-CM | POA: Diagnosis not present

## 2024-08-22 DIAGNOSIS — R2689 Other abnormalities of gait and mobility: Secondary | ICD-10-CM | POA: Diagnosis present

## 2024-08-22 DIAGNOSIS — R262 Difficulty in walking, not elsewhere classified: Secondary | ICD-10-CM | POA: Diagnosis present

## 2024-08-22 DIAGNOSIS — R269 Unspecified abnormalities of gait and mobility: Secondary | ICD-10-CM | POA: Diagnosis present

## 2024-08-22 DIAGNOSIS — M6281 Muscle weakness (generalized): Secondary | ICD-10-CM | POA: Insufficient documentation

## 2024-08-24 ENCOUNTER — Encounter

## 2024-08-25 ENCOUNTER — Ambulatory Visit: Attending: Cardiology

## 2024-08-25 DIAGNOSIS — I639 Cerebral infarction, unspecified: Secondary | ICD-10-CM | POA: Diagnosis not present

## 2024-08-25 LAB — CUP PACEART REMOTE DEVICE CHECK
Date Time Interrogation Session: 20251023233523
Implantable Pulse Generator Implant Date: 20240813

## 2024-08-28 ENCOUNTER — Ambulatory Visit: Admitting: Physical Therapy

## 2024-08-28 ENCOUNTER — Ambulatory Visit: Payer: Self-pay | Admitting: Cardiology

## 2024-08-28 DIAGNOSIS — R269 Unspecified abnormalities of gait and mobility: Secondary | ICD-10-CM

## 2024-08-28 DIAGNOSIS — M6281 Muscle weakness (generalized): Secondary | ICD-10-CM

## 2024-08-28 DIAGNOSIS — R2689 Other abnormalities of gait and mobility: Secondary | ICD-10-CM

## 2024-08-28 DIAGNOSIS — R262 Difficulty in walking, not elsewhere classified: Secondary | ICD-10-CM

## 2024-08-28 NOTE — Therapy (Signed)
 OUTPATIENT PHYSICAL THERAPY NEURO EVALUATION   Patient Name: Shannon Chung MRN: [id number] DOB:1941/09/01, 83 y.o., male Today's Date: 08/28/2024   PCP: Gretel App, NP  REFERRING PROVIDER: Gretel App, NP   END OF SESSION:  PT End of Session - 08/28/24 1343     Visit Number 2   Number of Visits 24    Progress Note Due on Visit 10   Equipment Utilized During Treatment Gait belt    Activity Tolerance Patient tolerated treatment well    Behavior During Therapy [facility] for tasks assessed/performed           Past Medical History:  Diagnosis Date   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 06/2015 Cardiac CT: Ca score 1103 (84th %'ile);  b. 07/2015 Cath: LM 70, LAD 80p, 100/14m, D1 70, D2 95, RI 75, RCA 100p/m;  c. 07/2015 CABG x 5 (LIMA->LAD, VG->Diag, VG->OM1->OM2, VG->OM3).   COVID-19    12/2021   COVID-19 01/18/2022   COVID-19 vaccine administered 01/18/2022   Unknown how many vaccine doses have been received. Entered from Emergency Triage Note.   Diabetes mellitus without complication (HCC) 07/2015   Dyslipidemia    Essential hypertension    Essential hypertension 01/02/2015   Formatting of this note might be different from the original.  Last Assessment & Plan:   Chronic, stable. Continue current regimen.   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 dilated, EF 35% improved to 45-50% (2015);  b. 07/2015 EF 25-35% by LV gram.   Ischemic cardiomyopathy 01/02/2015   Kidney stones 04/17/2021   Lone atrial fibrillation (HCC) 1983   a. isolated episode, not on OAC.   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 06/2014: LV; resolved with coumadin-->no residual on f/u echo, no longer on coumadin.   Mural thrombus of heart 08/26/2021   Formatting of this note might be different from the original. Jun 16, 2019 Entered By: VINIE ALLEAN AQUAS Comment: left ventricle, cardiac apex   Osteoarthritis    a. R-shoulder, L-knee Ted ortho)   Personal history of colonic polyps  Polyp of colon    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   PSA elevation 03/25/2018   Retention cyst of paranasal sinus 03/11/2022   Shoulder pain 12/23/2022   Jun 21, 2019 Entered By: VINIE ALLEAN AQUAS Comment: attributed to arthritis   Skin cancer    squamous and basal cell right forearm, SCC left cheek 10/04/20 sees derm regularly Dr. 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 Jun 16, 2019 Entered By: VINIE ALLEAN AQUAS Comment: leftAug 19, 2020 Entered By: VINIE ALLEAN AQUAS Comment: resolved by total left knee replacement Jun 16, 2019 Entered By: VINIE ALLEAN AQUAS Comment: leftAug 19, 2020 Entered By: VINIE ALLEAN AQUAS Comment: resolved by total left knee replacement   Trigger finger of left hand 07/07/2019   Vitamin D  deficiency    Past Surgical History:  Procedure Laterality Date   BICEPS TENDON REPAIR Right 1993   CARDIAC CATHETERIZATION N/A 07/05/2015   Procedure: Left Heart Cath and Coronary Angiography;  Surgeon: Evalene JINNY Lunger, MD;  Location: [facility] INVASIVE CV LAB;  Service: Cardiovascular;  Laterality: N/A;   CAROTID PTA/STENT INTERVENTION Left 04/26/2023   Procedure: CAROTID PTA/STENT INTERVENTION;  Surgeon: Marea Selinda RAMAN, MD;  Location: [facility] INVASIVE CV LAB;  Service: Cardiovascular;  Laterality: Left;   CATARACT EXTRACTION Left 12/2016   with keratoplasty   COLONOSCOPY  2007   COLONOSCOPY WITH PROPOFOL  N/A 12/02/2017   TA, SSA, rpt 3 yrs(Tahiliani, Varnita B, MD)   COLONOSCOPY WITH PROPOFOL  N/A 11/26/2020   Procedure: COLONOSCOPY WITH PROPOFOL ;  Surgeon: Jinny Carmine, MD;  Location: [facility] ENDOSCOPY;  Service: Endoscopy;  Laterality: N/A;   CORONARY ARTERY BYPASS GRAFT N/A 07/29/2015   Procedure: CORONARY ARTERY BYPASS GRAFTING (CABG) x 5 (LIMA to LAD, SVG to DIAGONAL,  SVG SEQUENTIALLY to OM1 and OM2, SVG to OM3) with Endoscopic Vein Havesting of  GREATER SAPHENOUS VEIN from RIGHT THIGH and partial LOWER LEG ;  Surgeon: Dorise MARLA Fellers, MD;  Location: [facility] OR;  Service: Open Heart Surgery;  Laterality: N/A;   EYE SURGERY     b/l cataract and cornea replaced    HAND SURGERY     left hand 1st/2nd trigger fingers Dr. Cleotilde ortho    JOINT REPLACEMENT     KNEE ARTHROSCOPY Left remote   MOHS SURGERY     left cheek scc 2022 Dr. Lloyd   MOHS SURGERY     x 5 facial scc   right biceps tendon     repair/re attachment     SKIN CANCER EXCISION  10/2015   BCC - L ala (pending MOHs) and L scapula (complete excision)   TEE WITHOUT CARDIOVERSION N/A 07/29/2015   Procedure: TRANSESOPHAGEAL ECHOCARDIOGRAM (TEE);  Surgeon: Dorise MARLA Fellers, MD;  Location: [facility] OR;  Service: Open Heart Surgery;  Laterality: N/A;   TONSILLECTOMY  1949   TOTAL KNEE ARTHROPLASTY Left 03/18/2016   cemented L TKR; Kayla Cleotilde, MD   Patient Active Problem List   Diagnosis Date Noted   Back pain 08/09/2024   Stroke-like symptoms 08/01/2024   Status post placement of implantable loop recorder 07/12/2024   Seizure disorder (HCC) 06/28/2024   Annual physical exam 07/10/2023   TIA (transient ischemic attack) 05/02/2023  GERD without esophagitis 05/02/2023   Carotid stenosis, symptomatic, with infarction (HCC) 04/26/2023   History of CVA (cerebrovascular accident) without residual deficits 03/04/2023   Hemiparesis affecting right side as late effect of cerebrovascular accident (CVA) (HCC) 02/22/2023   Chronic radicular pain of lower back 08/10/2022   Cervical spondylosis 03/11/2022   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   Bilateral carotid artery stenosis 03/11/2022   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   Lumbar spondylosis 10/07/2021   DDD (degenerative disc disease), lumbar 10/07/2021   History of radiation therapy 08/26/2021   Aortic atherosclerosis 04/17/2021   Diverticulosis 04/17/2021   Hypertension associated with diabetes (HCC) 10/07/2020   Overweight (BMI 25.0-29.9) 10/07/2020   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   Vitamin D  deficiency 08/01/2020   Polyp of sigmoid colon 08/01/2020   Insomnia 06/07/2020   Degenerative joint disease of hand 03/01/2020   BPH (benign prostatic hyperplasia) 08/05/2018   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   Macular scar of both eyes 04/20/2018   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   Scotoma involving central area of both eyes 04/20/2018   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   Fatty  liver 03/25/2018   Erectile dysfunction 02/23/2017   Hx of CABG 01/24/2017   Advanced care planning/counseling discussion 10/21/2015   Coronary artery disease of native artery of native heart with stable angina pectoris    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   Dyslipidemia 01/02/2015   Osteoarthritis 01/02/2015   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/02/2014    ONSET DATE: 08/01/24  REFERRING DIAG: Z86.73 (ICD-10-CM) - History of CVA (cerebrovascular accident)   THERAPY DIAG:  Muscle weakness (generalized)  Difficulty in walking, not elsewhere classified  Abnormality of gait and mobility  Other abnormalities of gait and mobility  Rationale for Evaluation and Treatment: Rehabilitation  SUBJECTIVE:                                                                                                                                                                                             SUBJECTIVE STATEMENT: Today: Pt. Reports no changes since last session is ambulating with SPC today. Brought his cane to make Goergi happy.   From eval: Pt had recent CVA. Pt was unable to get inpatient rehab but was able to get with home health. Pt had to go through a marathon with nurses and PT for home health. Pt did end up in PT with a HHPT he appreciated and decided he should come to outpatient services. Pt feels he is improving but is not at his pre morbid level. Pt using cane at the moment and  uses transport chair to get to clinic.   Pt accompanied by: self  PERTINENT HISTORY: CVA and hospital admission on 08/01/24.PMX of with history of left carotid artery stent, CAD status post 5 vessel CABG, dyslipidemia, hypertension, history of stereotypic spells   PAIN:  Are you having pain? No  PRECAUTIONS: Fall    WEIGHT BEARING RESTRICTIONS: No  FALLS: Has patient fallen in last 6 months? No  LIVING ENVIRONMENT: Lives with: lives  with their spouse Lives in: House/apartment- Duplex Stairs: No Has following equipment at home: Single point cane and Walker - 2 wheeled  PLOF: Independent and Independent with basic ADLs  PATIENT GOALS: improve gait, improve R LE strength, improve balance and mobility.   OBJECTIVE:  Note: Objective measures were completed at Evaluation unless otherwise noted.  DIAGNOSTIC FINDINGS: 1. Scattered patchy small volume acute ischemic nonhemorrhagic left MCA distribution infarcts as above. No associated mass effect. 2. Underlying chronic left frontoparietal infarct, with additional small remote lacunar infarcts about the left greater than right basal ganglia. 3. Underlying age-related cerebral atrophy with mild chronic small vessel ischemic disease.  COGNITION: Overall cognitive status: Within functional limits for tasks assessed   SENSATION: WFL   LOWER EXTREMITY ROM:   WNL for tasks assessed    LOWER EXTREMITY MMT:    MMT Right Eval Left Eval  Hip flexion 4 4+  Hip extension    Hip abduction 4 4+  Hip adduction 4+ 4+  Hip internal rotation    Hip external rotation    Knee flexion 4 4+  Knee extension 4 4+  Ankle dorsiflexion 4 4+  Ankle plantarflexion    Ankle inversion    Ankle eversion    (Blank rows = not tested)  BED MOBILITY:  Not tested  TRANSFERS: Sit to stand: Complete Independence  Assistive device utilized: Single point cane     Stand to sit: Modified independence  Assistive device utilized: Tree Surgeon to chair: Modified independence  Assistive device utilized: Counselling psychologist       GAIT: Findings: Gait Characteristics: decreased step length- Right, decreased step length- Left, decreased stride length, and decreased hip/knee flexion- Right, Distance walked: 30 ft, and Comments:    FUNCTIONAL TESTS:  5 times sit to stand: 21.23 sec Timed up and go (TUG): test visit 2  6 minute walk test: 554 ft with CGA  BERG 44 : .58  m/s  PATIENT SURVEYS:  SIS 16: 65.6 %                                                                                                                             TREATMENT DATE: 08/28/24  Physical Performance Test or Measurement: a  physical performance test(s) or measurement (eg,  musculoskeletal, functional capacity), with written report,  each 15 mins   PT instructed pt in TUG: 13.1 sec ( >13.5 sec indicates increased fall risk)     TE-  To improve strength, endurance, mobility, and function of specific targeted muscle groups or improve joint range of motion or improve muscle flexibility  Exercises - Sit to Stand Without Arm Support  - 3 sets - 10 reps - Mini Lunge with Counter Support   - 2 sets - 10 reps - Side Stepping with Resistance at Ankles and Counter Support   2 sets - 10 reps -RTB  - Marching Near Counter  - 2 sets - 10 reps  Gait training  Stepping over obstacles working on foot clearance for gait x 2 laps through leading with ea LE  -cues for preventing circumduction around obstacles, improved with practice  *1 lap = 1 time each way to return to start point    PATIENT EDUCATION: Education details: Pt educated throughout session about proper posture and technique with exercises. Improved exercise technique, movement at target joints, use of target muscles after min to mod verbal, visual, tactile cues. Person educated: Patient Education method: Explanation Education comprehension: verbalized understanding   HOME EXERCISE PROGRAM: Access Code: [email or web address] URL: [email or web address] Date: 08/28/2024 Prepared by: Lonni Gainer  Exercises - Sit to Stand Without Arm Support  - 1 x daily - 7 x weekly - 3 sets - 10 reps - Mini Lunge with Counter Support   - 1 x daily - 7 x weekly - 2 sets - 10 reps - Side Stepping with Resistance at Ankles and Counter Support  - 1 x daily - 7 x weekly - 2 sets - 10 reps - Marching Near Counter  - 1 x daily - 7 x  weekly - 2 sets - 10 reps   SHORT TERM GOALS: Target date: 09/19/2024       Patient will be independent in home exercise program to improve strength/mobility for better functional independence with ADLs. Baseline: No HEP currently  Goal status: INITIAL   LONG TERM GOALS: Target date: 11/14/2024    1.  Patient will complete five times sit to stand test in < 15 seconds indicating an increased LE strength and improved balance. Baseline: 21.23 sec  Goal status: INITIAL  2.  Patient will improve SIS 16 score to 75   to demonstrate statistically significant improvement in mobility and quality of life as it relates to their CVA functional deficits.  Baseline: 65.6% Goal status: INITIAL   3.  Patient will increase Berg Balance score by > 6 points to demonstrate decreased fall risk during functional activities. Baseline: 44 Goal status: INITIAL   4.   Patient will reduce timed up and go to <11 seconds to reduce fall risk and demonstrate improved transfer/gait ability. Baseline: 13.11 sec  Goal status: INITIAL  5.   Patient will increase 10 meter walk test to >1.20m/s as to improve gait speed for better community ambulation and to reduce fall risk. Baseline: .58 m/s Goal status: INITIAL  6.   Patient will increase six minute walk test distance to >1000 for progression to community ambulator and improve gait ability Baseline: 554 ft with quad cane and CGA Goal status: INITIAL    ASSESSMENT:  CLINICAL IMPRESSION: Patient arrived with good motivation for completion of pt activities. Pt. Began with HEP focused on LE strength and balance. During gait with object step over pt. Had one loss of balance where right foot did not have enough clearance which results in Mod assist to prevent fall. With increased practice pt. Foot clearance improved with these activities. Would recommend for further progression with continued visits.  Pt will continue  to benefit from skilled physical therapy  intervention to address impairments, improve QOL, and attain therapy goals.    OBJECTIVE IMPAIRMENTS: Abnormal gait, decreased activity tolerance, decreased balance, decreased endurance, decreased mobility, difficulty walking, decreased strength, and hypomobility.   ACTIVITY LIMITATIONS: standing, squatting, stairs, transfers, and locomotion level  PARTICIPATION LIMITATIONS: shopping, community activity, and yard work  PERSONAL FACTORS: Age and 3+ comorbidities:  with history of left carotid artery stent, CAD status post 5 vessel CABG, dyslipidemia, hypertension, history of stereotypic spells are also affecting patient's functional outcome.   REHAB POTENTIAL: Good  CLINICAL DECISION MAKING: Evolving/moderate complexity  EVALUATION COMPLEXITY: Moderate  PLAN:  PT FREQUENCY: 2x/week  PT DURATION: 12 weeks  PLANNED INTERVENTIONS: 97750- Physical Performance Testing, 97110-Therapeutic exercises, 97530- Therapeutic activity, [id number]- Neuromuscular re-education, 97535- Self Care, [id number]- Manual therapy, [phone]- Gait training, Patient/Family education, Balance training, and Stair training  PLAN FOR NEXT SESSION: strength and balance R side, stepping over objects right side foot clearance focus.     Lonni KATHEE Gainer, PT 08/28/2024, 1:45 PM

## 2024-08-29 ENCOUNTER — Telehealth: Payer: Self-pay

## 2024-08-29 NOTE — Telephone Encounter (Signed)
 [facility] home health orders placed in provider to be signed folder

## 2024-08-30 ENCOUNTER — Other Ambulatory Visit: Payer: Self-pay

## 2024-08-30 DIAGNOSIS — I255 Ischemic cardiomyopathy: Secondary | ICD-10-CM

## 2024-08-30 DIAGNOSIS — E119 Type 2 diabetes mellitus without complications: Secondary | ICD-10-CM

## 2024-08-30 DIAGNOSIS — G40909 Epilepsy, unspecified, not intractable, without status epilepticus: Secondary | ICD-10-CM

## 2024-08-30 DIAGNOSIS — E1159 Type 2 diabetes mellitus with other circulatory complications: Secondary | ICD-10-CM

## 2024-08-30 DIAGNOSIS — I152 Hypertension secondary to endocrine disorders: Secondary | ICD-10-CM

## 2024-08-30 DIAGNOSIS — E785 Hyperlipidemia, unspecified: Secondary | ICD-10-CM | POA: Diagnosis not present

## 2024-08-30 DIAGNOSIS — I2510 Atherosclerotic heart disease of native coronary artery without angina pectoris: Secondary | ICD-10-CM | POA: Diagnosis not present

## 2024-08-30 DIAGNOSIS — I700 Atherosclerosis of aorta: Secondary | ICD-10-CM

## 2024-08-30 DIAGNOSIS — F4323 Adjustment disorder with mixed anxiety and depressed mood: Secondary | ICD-10-CM

## 2024-08-30 DIAGNOSIS — I69320 Aphasia following cerebral infarction: Secondary | ICD-10-CM | POA: Diagnosis not present

## 2024-08-30 DIAGNOSIS — G319 Degenerative disease of nervous system, unspecified: Secondary | ICD-10-CM

## 2024-08-30 DIAGNOSIS — I491 Atrial premature depolarization: Secondary | ICD-10-CM | POA: Diagnosis not present

## 2024-08-30 DIAGNOSIS — G9389 Other specified disorders of brain: Secondary | ICD-10-CM

## 2024-08-30 MED ORDER — METFORMIN HCL 500 MG PO TABS: 500.0000 mg | ORAL_TABLET | Freq: Two times a day (BID) | ORAL | 3 refills | Status: AC

## 2024-08-30 NOTE — Progress Notes (Signed)
 Remote Loop Recorder Transmission

## 2024-08-31 ENCOUNTER — Ambulatory Visit: Admitting: Physical Therapy

## 2024-08-31 DIAGNOSIS — R269 Unspecified abnormalities of gait and mobility: Secondary | ICD-10-CM

## 2024-08-31 DIAGNOSIS — M6281 Muscle weakness (generalized): Secondary | ICD-10-CM

## 2024-08-31 DIAGNOSIS — R2689 Other abnormalities of gait and mobility: Secondary | ICD-10-CM

## 2024-08-31 DIAGNOSIS — R262 Difficulty in walking, not elsewhere classified: Secondary | ICD-10-CM

## 2024-08-31 NOTE — Telephone Encounter (Signed)
 Form faxed and placed in red folder with charge sheet

## 2024-08-31 NOTE — Therapy (Signed)
 OUTPATIENT PHYSICAL THERAPY NEURO TREATMENT   Patient Name: Shannon Chung MRN: [id number] DOB:1941-08-24, 83 y.o., male Today's Date: 08/31/2024   PCP: Gretel App, NP  REFERRING PROVIDER: Gretel App, NP   END OF SESSION:  PT End of Session - 08/31/24 0936     Visit Number 3    Number of Visits 24    Progress Note Due on Visit 20    PT Start Time 0932    PT Stop Time 1012    PT Time Calculation (min) 40 min    Equipment Utilized During Treatment Gait belt    Activity Tolerance Patient tolerated treatment well    Behavior During Therapy WFL for tasks assessed/performed           Past Medical History:  Diagnosis Date   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 06/2015 Cardiac CT: Ca score 1103 (84th %'ile);  b. 07/2015 Cath: LM 70, LAD 80p, 100/27m, D1 70, D2 95, RI 75, RCA 100p/m;  c. 07/2015 CABG x 5 (LIMA->LAD, VG->Diag, VG->OM1->OM2, VG->OM3).   COVID-19    12/2021   COVID-19 01/18/2022   COVID-19 vaccine administered 01/18/2022   Unknown how many vaccine doses have been received. Entered from Emergency Triage Note.   Diabetes mellitus without complication (HCC) 07/2015   Dyslipidemia    Essential hypertension    Essential hypertension 01/02/2015   Formatting of this note might be different from the original.  Last Assessment & Plan:   Chronic, stable. Continue current regimen.   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 dilated, EF 35% improved to 45-50% (2015);  b. 07/2015 EF 25-35% by LV gram.   Ischemic cardiomyopathy 01/02/2015   Kidney stones 04/17/2021   Lone atrial fibrillation (HCC) 1983   a. isolated episode, not on OAC.   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 06/2014: LV; resolved with coumadin-->no residual on f/u echo, no longer on coumadin.   Mural thrombus of heart 08/26/2021   Formatting of this note might be different from the original. Jun 16, 2019 Entered By: VINIE ALLEAN AQUAS Comment:  left ventricle, cardiac apex   Osteoarthritis    a. 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 Jun 16, 2019 Entered By: VINIE ALLEAN AQUAS Comment: leftAug 19, 2020 Entered By: VINIE ALLEAN AQUAS Comment: resolved by total left knee replacement Jun 16, 2019 Entered By: VINIE ALLEAN AQUAS Comment: leftAug 19, 2020 Entered By: VINIE ALLEAN AQUAS Comment: resolved by total left knee replacement   Trigger finger of left hand 07/07/2019   Vitamin D  deficiency    Past Surgical History:  Procedure Laterality Date   BICEPS TENDON REPAIR Right 1993   CARDIAC CATHETERIZATION N/A 07/05/2015   Procedure: Left Heart Cath and Coronary Angiography;  Surgeon: Evalene JINNY Lunger, MD;  Location: [facility] INVASIVE CV LAB;  Service: Cardiovascular;  Laterality: N/A;   CAROTID PTA/STENT INTERVENTION Left 04/26/2023   Procedure: CAROTID PTA/STENT INTERVENTION;  Surgeon: Marea Selinda RAMAN, MD;  Location: [facility] INVASIVE CV LAB;  Service: Cardiovascular;  Laterality: Left;   CATARACT EXTRACTION Left 12/2016   with keratoplasty   COLONOSCOPY  2007   COLONOSCOPY WITH PROPOFOL  N/A 12/02/2017   TA, SSA, rpt 3 yrs(Tahiliani, Varnita B, MD)   COLONOSCOPY WITH PROPOFOL  N/A 11/26/2020   Procedure: COLONOSCOPY WITH PROPOFOL ;  Surgeon: Jinny Carmine, MD;  Location: [facility] ENDOSCOPY;  Service: Endoscopy;  Laterality: N/A;   CORONARY ARTERY BYPASS GRAFT N/A 07/29/2015   Procedure: CORONARY ARTERY BYPASS GRAFTING (CABG) x 5 (LIMA to LAD, SVG to DIAGONAL,  SVG SEQUENTIALLY to OM1 and OM2, SVG to OM3) with Endoscopic Vein Havesting of  GREATER SAPHENOUS VEIN from RIGHT THIGH and partial LOWER LEG ;  Surgeon: Dorise MARLA Fellers, MD;  Location: [facility] OR;  Service: Open Heart Surgery;  Laterality: N/A;   EYE SURGERY     b/l cataract and cornea replaced    HAND SURGERY     left hand 1st/2nd trigger fingers Dr. Cleotilde ortho    JOINT REPLACEMENT     KNEE ARTHROSCOPY Left remote   MOHS SURGERY     left cheek scc 2022 Dr. Lloyd    MOHS SURGERY     x 5 facial scc   right biceps tendon     repair/re attachment    SKIN CANCER EXCISION  10/2015   BCC - L ala (pending MOHs) and L scapula (complete excision)   TEE WITHOUT CARDIOVERSION N/A 07/29/2015   Procedure: TRANSESOPHAGEAL ECHOCARDIOGRAM (TEE);  Surgeon: Dorise MARLA Fellers, MD;  Location: [facility] OR;  Service: Open Heart Surgery;  Laterality: N/A;   TONSILLECTOMY  1949   TOTAL KNEE ARTHROPLASTY Left 03/18/2016   cemented L TKR; Kayla Cleotilde, MD   Patient Active Problem List   Diagnosis Date Noted   Back pain 08/09/2024   Stroke-like symptoms 08/01/2024   Status post placement  of implantable loop recorder 07/12/2024   Seizure disorder (HCC) 06/28/2024   Annual physical exam 07/10/2023   TIA (transient ischemic attack) 05/02/2023   GERD without esophagitis 05/02/2023   Carotid stenosis, symptomatic, with infarction (HCC) 04/26/2023   History of CVA (cerebrovascular accident) without residual deficits 03/04/2023   Hemiparesis affecting right side as late effect of cerebrovascular accident (CVA) (HCC) 02/22/2023   Chronic radicular pain of lower back 08/10/2022   Cervical spondylosis 03/11/2022   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   Bilateral carotid artery stenosis 03/11/2022   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   Lumbar spondylosis 10/07/2021   DDD (degenerative disc disease), lumbar 10/07/2021   History of radiation therapy 08/26/2021   Aortic atherosclerosis 04/17/2021   Diverticulosis 04/17/2021   Hypertension associated with diabetes (HCC) 10/07/2020   Overweight (BMI 25.0-29.9) 10/07/2020   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   Vitamin D  deficiency 08/01/2020   Polyp of sigmoid colon 08/01/2020   Insomnia 06/07/2020   Degenerative joint disease of hand 03/01/2020   BPH (benign prostatic hyperplasia) 08/05/2018   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   Macular scar of both eyes 04/20/2018   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   Scotoma involving  central area of both eyes 04/20/2018   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   Fatty liver 03/25/2018   Erectile dysfunction 02/23/2017   Hx of CABG 01/24/2017   Advanced care planning/counseling discussion 10/21/2015   Coronary artery disease of native artery of native heart with stable angina pectoris    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   Dyslipidemia 01/02/2015   Osteoarthritis 01/02/2015   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/02/2014    ONSET DATE: 08/01/24  REFERRING DIAG: Z86.73 (ICD-10-CM) - History of CVA (cerebrovascular accident)   THERAPY DIAG:  Muscle weakness (generalized)  Difficulty in walking, not elsewhere classified  Abnormality of gait and mobility  Other abnormalities of gait and mobility  Rationale for Evaluation and Treatment: Rehabilitation  SUBJECTIVE:                                                                                                                                                                                             SUBJECTIVE STATEMENT: Today: Pt. Reports no changes since last session is ambulating with SPC today.   From eval: Pt had recent CVA. Pt was unable to get inpatient rehab but was able to get with home health. Pt had to go through a marathon with nurses and PT for home health. Pt did end up in PT with a HHPT he appreciated and decided he should come to outpatient services. Pt  feels he is improving but is not at his pre morbid level. Pt using cane at the moment and uses transport chair to get to clinic.   Pt accompanied by: self  PERTINENT HISTORY: CVA and hospital admission on 08/01/24.PMX of with history of left carotid artery stent, CAD status post 5 vessel CABG, dyslipidemia, hypertension, history of stereotypic spells   PAIN:  Are you having pain? No  PRECAUTIONS: Fall    WEIGHT BEARING RESTRICTIONS: No  FALLS: Has patient fallen in last 6 months?  No  LIVING ENVIRONMENT: Lives with: lives with their spouse Lives in: House/apartment- Duplex Stairs: No Has following equipment at home: Single point cane and Walker - 2 wheeled  PLOF: Independent and Independent with basic ADLs  PATIENT GOALS: improve gait, improve R LE strength, improve balance and mobility.   OBJECTIVE:  Note: Objective measures were completed at Evaluation unless otherwise noted.  DIAGNOSTIC FINDINGS: 1. Scattered patchy small volume acute ischemic nonhemorrhagic left MCA distribution infarcts as above. No associated mass effect. 2. Underlying chronic left frontoparietal infarct, with additional small remote lacunar infarcts about the left greater than right basal ganglia. 3. Underlying age-related cerebral atrophy with mild chronic small vessel ischemic disease.  COGNITION: Overall cognitive status: Within functional limits for tasks assessed   SENSATION: WFL   LOWER EXTREMITY ROM:   WNL for tasks assessed    LOWER EXTREMITY MMT:    MMT Right Eval Left Eval  Hip flexion 4 4+  Hip extension    Hip abduction 4 4+  Hip adduction 4+ 4+  Hip internal rotation    Hip external rotation    Knee flexion 4 4+  Knee extension 4 4+  Ankle dorsiflexion 4 4+  Ankle plantarflexion    Ankle inversion    Ankle eversion    (Blank rows = not tested)  BED MOBILITY:  Not tested  TRANSFERS: Sit to stand: Complete Independence  Assistive device utilized: Single point cane     Stand to sit: Modified independence  Assistive device utilized: Tree Surgeon to chair: Modified independence  Assistive device utilized: Counselling psychologist       GAIT: Findings: Gait Characteristics: decreased step length- Right, decreased step length- Left, decreased stride length, and decreased hip/knee flexion- Right, Distance walked: 30 ft, and Comments:    FUNCTIONAL TESTS:  5 times sit to stand: 21.23 sec Timed up and go (TUG): test visit 2  6 minute walk  test: 554 ft with CGA  BERG 44 : .58 m/s  PATIENT SURVEYS:  SIS 16: 65.6 %                                                                                                                             TREATMENT DATE: 08/31/24  TA- To improve functional movements patterns for everyday tasks   Nustep level 4-7 x 8 min LE only for reciprocal large LE movement training.  STS from plinth table with focus on arm movement to improve ant weight shift in standing - pt tends to have weight through heels 2 x 10   Gait training - activities focussed on specific components of gait cycle and multimodal cueing for completion  In // bars reciprocal stepping over 4 x 1/2 bolsters x 5 laps, UE assist   Another round but with 5# AW donned   Lateral stepping over 1/2 bolsters x 5 laps - UE assist   Gait ant and retro in // bars with 5# AW x 5 laps - UE assist  Gait x 170 ft focussed on foot clearance throughout   Unless otherwise stated, CGA was provided and gait belt donned in order to ensure pt safety  *1 lap = 1 time each way to return to start point    PATIENT EDUCATION: Education details: Pt educated throughout session about proper posture and technique with exercises. Improved exercise technique, movement at target joints, use of target muscles after min to mod verbal, visual, tactile cues. Person educated: Patient Education method: Explanation Education comprehension: verbalized understanding   HOME EXERCISE PROGRAM: Access Code: [email or web address] URL: [email or web address] Date: 08/28/2024 Prepared by: Lonni Gainer  Exercises - Sit to Stand Without Arm Support  - 1 x daily - 7 x weekly - 3 sets - 10 reps - Mini Lunge with Counter Support   - 1 x daily - 7 x weekly - 2 sets - 10 reps - Side Stepping with Resistance at Ankles and Counter Support  - 1 x daily - 7 x weekly - 2 sets - 10 reps - Marching Near Counter  - 1 x daily - 7 x weekly - 2 sets - 10 reps   SHORT  TERM GOALS: Target date: 09/19/2024       Patient will be independent in home exercise program to improve strength/mobility for better functional independence with ADLs. Baseline: No HEP currently  Goal status: INITIAL   LONG TERM GOALS: Target date: 11/14/2024    1.  Patient will complete five times sit to stand test in < 15 seconds indicating an increased LE strength and improved balance. Baseline: 21.23 sec  Goal status: INITIAL  2.  Patient will improve SIS 16 score to 75   to demonstrate statistically significant improvement in mobility and quality of life as it relates to their CVA functional deficits.  Baseline: 65.6% Goal status: INITIAL   3.  Patient will increase Berg Balance score by > 6 points to demonstrate decreased fall risk during functional activities. Baseline: 44 Goal status: INITIAL   4.   Patient will reduce timed up and go to <11 seconds to reduce fall risk and demonstrate improved transfer/gait ability. Baseline: 13.11 sec  Goal status: INITIAL  5.   Patient will increase 10 meter walk test to >1.38m/s as to improve gait speed for better community ambulation and to reduce fall risk. Baseline: .58 m/s Goal status: INITIAL  6.   Patient will increase six minute walk test distance to >1000 for progression to community ambulator and improve gait ability Baseline: 554 ft with quad cane and CGA Goal status: INITIAL    ASSESSMENT:  CLINICAL IMPRESSION: Patient arrived with good motivation for completion of pt activities. Pt. Continued with activities for improved foot clearance with gait and pt adequately challenged with this task. Pt Showing good progress with STS activity but still requires cues for improved ant weight shift in standing. Pt will continue to benefit from skilled physical therapy  intervention to address impairments, improve QOL, and attain therapy goals.    OBJECTIVE IMPAIRMENTS: Abnormal gait, decreased activity tolerance, decreased  balance, decreased endurance, decreased mobility, difficulty walking, decreased strength, and hypomobility.   ACTIVITY LIMITATIONS: standing, squatting, stairs, transfers, and locomotion level  PARTICIPATION LIMITATIONS: shopping, community activity, and yard work  PERSONAL FACTORS: Age and 3+ comorbidities:  with history of left carotid artery stent, CAD status post 5 vessel CABG, dyslipidemia, hypertension, history of stereotypic spells are also affecting patient's functional outcome.   REHAB POTENTIAL: Good  CLINICAL DECISION MAKING: Evolving/moderate complexity  EVALUATION COMPLEXITY: Moderate  PLAN:  PT FREQUENCY: 2x/week  PT DURATION: 12 weeks  PLANNED INTERVENTIONS: 97750- Physical Performance Testing, 97110-Therapeutic exercises, 97530- Therapeutic activity, [id number]- Neuromuscular re-education, 97535- Self Care, [id number]- Manual therapy, [id number]- Gait training, Patient/Family education, Balance training, and Stair training  PLAN FOR NEXT SESSION: strength and balance R side, stepping over objects right side foot clearance focus.     Lonni KATHEE Gainer, PT 08/31/2024, 9:37 AM

## 2024-09-04 ENCOUNTER — Ambulatory Visit: Attending: Nurse Practitioner | Admitting: Physical Therapy

## 2024-09-04 ENCOUNTER — Encounter

## 2024-09-04 DIAGNOSIS — R269 Unspecified abnormalities of gait and mobility: Secondary | ICD-10-CM | POA: Diagnosis present

## 2024-09-04 DIAGNOSIS — R262 Difficulty in walking, not elsewhere classified: Secondary | ICD-10-CM | POA: Diagnosis present

## 2024-09-04 DIAGNOSIS — R2689 Other abnormalities of gait and mobility: Secondary | ICD-10-CM | POA: Diagnosis present

## 2024-09-04 DIAGNOSIS — M25552 Pain in left hip: Secondary | ICD-10-CM | POA: Diagnosis present

## 2024-09-04 DIAGNOSIS — M6281 Muscle weakness (generalized): Secondary | ICD-10-CM | POA: Diagnosis present

## 2024-09-04 DIAGNOSIS — M545 Low back pain, unspecified: Secondary | ICD-10-CM | POA: Insufficient documentation

## 2024-09-04 NOTE — Therapy (Signed)
 OUTPATIENT PHYSICAL THERAPY NEURO TREATMENT   Patient Name: Shannon Chung MRN: [id number] DOB:[birth year], 83 y.o., male Today's Date: 09/04/2024   PCP: Gretel App, NP  REFERRING PROVIDER: Gretel App, NP   END OF SESSION:  PT End of Session - 09/04/24 1703     Visit Number 4    Number of Visits 24    Progress Note Due on Visit 20    PT Start Time 1615    PT Stop Time 1658    PT Time Calculation (min) 43 min    Equipment Utilized During Treatment Gait belt    Activity Tolerance Patient tolerated treatment well    Behavior During Therapy WFL for tasks assessed/performed            Past Medical History:  Diagnosis Date   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 06/2015 Cardiac CT: Ca score 1103 (84th %'ile);  b. 07/2015 Cath: LM 70, LAD 80p, 100/81m, D1 70, D2 95, RI 75, RCA 100p/m;  c. 07/2015 CABG x 5 (LIMA->LAD, VG->Diag, VG->OM1->OM2, VG->OM3).   COVID-19    12/2021   COVID-19 01/18/2022   COVID-19 vaccine administered 01/18/2022   Unknown how many vaccine doses have been received. Entered from Emergency Triage Note.   Diabetes mellitus without complication (HCC) 07/2015   Dyslipidemia    Essential hypertension    Essential hypertension 01/02/2015   Formatting of this note might be different from the original.  Last Assessment & Plan:   Chronic, stable. Continue current regimen.   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 dilated, EF 35% improved to 45-50% (2015);  b. 07/2015 EF 25-35% by LV gram.   Ischemic cardiomyopathy 01/02/2015   Kidney stones 04/17/2021   Lone atrial fibrillation (HCC) 1983   a. isolated episode, not on OAC.   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 06/2014: LV; resolved with coumadin-->no residual on f/u echo, no longer on coumadin.   Mural thrombus of heart 08/26/2021   Formatting of this note might be different from the original. Jun 16, 2019 Entered By: HICKS,KRISTIN DENISE  Comment: left ventricle, cardiac apex   Osteoarthritis    a. 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 Jun 16, 2019 Entered By: VINIE ALLEAN AQUAS Comment: leftAug 19, 2020 Entered By: VINIE ALLEAN AQUAS Comment: resolved by total left knee replacement Jun 16, 2019 Entered By: VINIE ALLEAN AQUAS Comment: leftAug 19, 2020 Entered By: VINIE ALLEAN AQUAS Comment: resolved by total left knee replacement   Trigger finger of left hand 07/07/2019   Vitamin D  deficiency    Past Surgical History:  Procedure Laterality Date   BICEPS TENDON REPAIR Right 1993   CARDIAC CATHETERIZATION N/A 07/05/2015   Procedure: Left Heart Cath and Coronary Angiography;  Surgeon: Evalene JINNY Lunger, MD;  Location: [facility] INVASIVE CV LAB;  Service: Cardiovascular;  Laterality: N/A;   CAROTID PTA/STENT INTERVENTION Left 04/26/2023   Procedure: CAROTID PTA/STENT INTERVENTION;  Surgeon: Marea Selinda RAMAN, MD;  Location: [facility] INVASIVE CV LAB;  Service: Cardiovascular;  Laterality: Left;   CATARACT EXTRACTION Left 12/2016   with keratoplasty   COLONOSCOPY  2007   COLONOSCOPY WITH PROPOFOL  N/A 12/02/2017   TA, SSA, rpt 3 yrs(Tahiliani, Varnita B, MD)   COLONOSCOPY WITH PROPOFOL  N/A 11/26/2020   Procedure: COLONOSCOPY WITH PROPOFOL ;  Surgeon: Jinny Carmine, MD;  Location: [facility] ENDOSCOPY;  Service: Endoscopy;  Laterality: N/A;   CORONARY ARTERY BYPASS GRAFT N/A 07/29/2015   Procedure: CORONARY ARTERY BYPASS GRAFTING (CABG) x 5 (LIMA to LAD, SVG to DIAGONAL,  SVG SEQUENTIALLY to OM1 and OM2, SVG to OM3) with Endoscopic Vein Havesting of  GREATER SAPHENOUS VEIN from RIGHT THIGH and partial LOWER LEG ;  Surgeon: Dorise MARLA Fellers, MD;  Location: [facility] OR;  Service: Open Heart Surgery;  Laterality: N/A;   EYE SURGERY     b/l cataract and cornea replaced    HAND SURGERY     left hand 1st/2nd trigger fingers Dr. Cleotilde ortho    JOINT REPLACEMENT     KNEE ARTHROSCOPY Left remote   MOHS SURGERY     left cheek scc 2022 Dr. Lloyd    MOHS SURGERY     x 5 facial scc   right biceps tendon     repair/re attachment    SKIN CANCER EXCISION  10/2015   BCC - L ala (pending MOHs) and L scapula (complete excision)   TEE WITHOUT CARDIOVERSION N/A 07/29/2015   Procedure: TRANSESOPHAGEAL ECHOCARDIOGRAM (TEE);  Surgeon: Dorise MARLA Fellers, MD;  Location: [facility] [place] OR;  Service: Open Heart Surgery;  Laterality: N/A;   TONSILLECTOMY  1949   TOTAL KNEE ARTHROPLASTY Left 03/18/2016   cemented L TKR; Kayla Cleotilde, MD   Patient Active Problem List   Diagnosis Date Noted   Back pain 08/09/2024   Stroke-like symptoms 08/01/2024   Status post  placement of implantable loop recorder 07/12/2024   Seizure disorder (HCC) 06/28/2024   Annual physical exam 07/10/2023   TIA (transient ischemic attack) 05/02/2023   GERD without esophagitis 05/02/2023   Carotid stenosis, symptomatic, with infarction (HCC) 04/26/2023   History of CVA (cerebrovascular accident) without residual deficits 03/04/2023   Hemiparesis affecting right side as late effect of cerebrovascular accident (CVA) (HCC) 02/22/2023   Chronic radicular pain of lower back 08/10/2022   Cervical spondylosis 03/11/2022   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   Bilateral carotid artery stenosis 03/11/2022   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   Lumbar spondylosis 10/07/2021   DDD (degenerative disc disease), lumbar 10/07/2021   History of radiation therapy 08/26/2021   Aortic atherosclerosis 04/17/2021   Diverticulosis 04/17/2021   Hypertension associated with diabetes (HCC) 10/07/2020   Overweight (BMI 25.0-29.9) 10/07/2020   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   Vitamin D  deficiency 08/01/2020   Polyp of sigmoid colon 08/01/2020   Insomnia 06/07/2020   Degenerative joint disease of hand 03/01/2020   BPH (benign prostatic hyperplasia) 08/05/2018   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   Macular scar of both eyes 04/20/2018   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   Scotoma involving  central area of both eyes 04/20/2018   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   Fatty liver 03/25/2018   Erectile dysfunction 02/23/2017   Hx of CABG 01/24/2017   Advanced care planning/counseling discussion 10/21/2015   Coronary artery disease of native artery of native heart with stable angina pectoris    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   Dyslipidemia 01/02/2015   Osteoarthritis 01/02/2015   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/02/2014    ONSET DATE: 08/01/24  REFERRING DIAG: Z86.73 (ICD-10-CM) - History of CVA (cerebrovascular accident)   THERAPY DIAG:  Muscle weakness (generalized)  Difficulty in walking, not elsewhere classified  Abnormality of gait and mobility  Other abnormalities of gait and mobility  Pain in left hip  Rationale for Evaluation and Treatment: Rehabilitation  SUBJECTIVE:                                                                                                                                                                                             SUBJECTIVE STATEMENT: Today: Pt. Reports no changes since last session is ambulating with SPC today.   From eval: Pt had recent CVA. Pt was unable to get inpatient rehab but was able to get with home health. Pt had to go through a marathon with nurses and PT for home health. Pt did end up in PT with a HHPT he appreciated and decided he  should come to outpatient services. Pt feels he is improving but is not at his pre morbid level. Pt using cane at the moment and uses transport chair to get to clinic.   Pt accompanied by: self  PERTINENT HISTORY: CVA and hospital admission on 08/01/24.PMX of with history of left carotid artery stent, CAD status post 5 vessel CABG, dyslipidemia, hypertension, history of stereotypic spells   PAIN:  Are you having pain? No  PRECAUTIONS: Fall    WEIGHT BEARING RESTRICTIONS: No  FALLS: Has patient fallen  in last 6 months? No  LIVING ENVIRONMENT: Lives with: lives with their spouse Lives in: House/apartment- Duplex Stairs: No Has following equipment at home: Single point cane and Walker - 2 wheeled  PLOF: Independent and Independent with basic ADLs  PATIENT GOALS: improve gait, improve R LE strength, improve balance and mobility.   OBJECTIVE:  Note: Objective measures were completed at Evaluation unless otherwise noted.  DIAGNOSTIC FINDINGS: 1. Scattered patchy small volume acute ischemic nonhemorrhagic left MCA distribution infarcts as above. No associated mass effect. 2. Underlying chronic left frontoparietal infarct, with additional small remote lacunar infarcts about the left greater than right basal ganglia. 3. Underlying age-related cerebral atrophy with mild chronic small vessel ischemic disease.  COGNITION: Overall cognitive status: Within functional limits for tasks assessed   SENSATION: WFL   LOWER EXTREMITY ROM:   WNL for tasks assessed    LOWER EXTREMITY MMT:    MMT Right Eval Left Eval  Hip flexion 4 4+  Hip extension    Hip abduction 4 4+  Hip adduction 4+ 4+  Hip internal rotation    Hip external rotation    Knee flexion 4 4+  Knee extension 4 4+  Ankle dorsiflexion 4 4+  Ankle plantarflexion    Ankle inversion    Ankle eversion    (Blank rows = not tested)  BED MOBILITY:  Not tested  TRANSFERS: Sit to stand: Complete Independence  Assistive device utilized: Single point cane     Stand to sit: Modified independence  Assistive device utilized: Tree Surgeon to chair: Modified independence  Assistive device utilized: Counselling psychologist       GAIT: Findings: Gait Characteristics: decreased step length- Right, decreased step length- Left, decreased stride length, and decreased hip/knee flexion- Right, Distance walked: 30 ft, and Comments:    FUNCTIONAL TESTS:  5 times sit to stand: 21.23 sec Timed up and go (TUG): test visit  2  6 minute walk test: 554 ft with CGA  BERG 44 : .58 m/s  PATIENT SURVEYS:  SIS 16: 65.6 %                                                                                                                             TREATMENT DATE: 09/04/24  TA- To improve functional movements patterns for everyday tasks   Nustep level 4-7 x 8 min LE only for  reciprocal large LE movement training.   STS from plinth table with focus on arm movement to improve ant weight shift in standing - pt tends to have weight through heels 3 x 10   Gait training - activities focussed on specific components of gait cycle and multimodal cueing for completion  In // bars stepping over 4 x 1/2 bolsters x 5 laps, no UE assist forward and UE assist with retro gait return to start point - 5# AW donned   Lateral stepping over 1/2 bolsters x 6 laps - UE assist and 5# AW donned   Gait x 320 ft focussed on foot clearance throughout   Unless otherwise stated, CGA was provided and gait belt donned in order to ensure pt safety  *1 lap = 1 time each way to return to start point  TE- To improve strength, endurance, mobility, and function of specific targeted muscle groups or improve joint range of motion or improve muscle flexibility  Seated LAQ 2 x 12 ea LE with 5# AW  Seated march 2 x 10 ea LE with 5# AW  -min UE support with these for improved core activation   PATIENT EDUCATION: Education details: Pt educated throughout session about proper posture and technique with exercises. Improved exercise technique, movement at target joints, use of target muscles after min to mod verbal, visual, tactile cues. Person educated: Patient Education method: Explanation Education comprehension: verbalized understanding   HOME EXERCISE PROGRAM: Access Code: [email or web address] URL: [email or web address] Date: 08/28/2024 Prepared by: Lonni Gainer  Exercises - Sit to Stand Without Arm Support  - 1 x daily - 7 x weekly  - 3 sets - 10 reps - Mini Lunge with Counter Support   - 1 x daily - 7 x weekly - 2 sets - 10 reps - Side Stepping with Resistance at Ankles and Counter Support  - 1 x daily - 7 x weekly - 2 sets - 10 reps - Marching Near Counter  - 1 x daily - 7 x weekly - 2 sets - 10 reps   SHORT TERM GOALS: Target date: 09/19/2024       Patient will be independent in home exercise program to improve strength/mobility for better functional independence with ADLs. Baseline: No HEP currently  Goal status: INITIAL   LONG TERM GOALS: Target date: 11/14/2024    1.  Patient will complete five times sit to stand test in < 15 seconds indicating an increased LE strength and improved balance. Baseline: 21.23 sec  Goal status: INITIAL  2.  Patient will improve SIS 16 score to 75   to demonstrate statistically significant improvement in mobility and quality of life as it relates to their CVA functional deficits.  Baseline: 65.6% Goal status: INITIAL   3.  Patient will increase Berg Balance score by > 6 points to demonstrate decreased fall risk during functional activities. Baseline: 44 Goal status: INITIAL   4.   Patient will reduce timed up and go to <11 seconds to reduce fall risk and demonstrate improved transfer/gait ability. Baseline: 13.11 sec  Goal status: INITIAL  5.   Patient will increase 10 meter walk test to >1.47m/s as to improve gait speed for better community ambulation and to reduce fall risk. Baseline: .58 m/s Goal status: INITIAL  6.   Patient will increase six minute walk test distance to >1000 for progression to community ambulator and improve gait ability Baseline: 554 ft with quad cane and CGA Goal status: INITIAL    ASSESSMENT:  CLINICAL  IMPRESSION: Patient arrived with good motivation for completion of pt activities. Pt. Continued with activities for improved foot clearance with gait and pt adequately challenged with this task. Still limited carry over with gait with  foot clearance, consider treadmill based to challenge R LE foot clearance in future sessions using lite gait.  Pt Showing good progress with STS activity but still requires cues for improved ant weight shift in standing. Pt will continue to benefit from skilled physical therapy intervention to address impairments, improve QOL, and attain therapy goals.    OBJECTIVE IMPAIRMENTS: Abnormal gait, decreased activity tolerance, decreased balance, decreased endurance, decreased mobility, difficulty walking, decreased strength, and hypomobility.   ACTIVITY LIMITATIONS: standing, squatting, stairs, transfers, and locomotion level  PARTICIPATION LIMITATIONS: shopping, community activity, and yard work  PERSONAL FACTORS: Age and 3+ comorbidities:  with history of left carotid artery stent, CAD status post 5 vessel CABG, dyslipidemia, hypertension, history of stereotypic spells are also affecting patient's functional outcome.   REHAB POTENTIAL: Good  CLINICAL DECISION MAKING: Evolving/moderate complexity  EVALUATION COMPLEXITY: Moderate  PLAN:  PT FREQUENCY: 2x/week  PT DURATION: 12 weeks  PLANNED INTERVENTIONS: 97750- Physical Performance Testing, 97110-Therapeutic exercises, 97530- Therapeutic activity, [id number]- Neuromuscular re-education, 97535- Self Care, [id number]- Manual therapy, [id number]- Gait training, Patient/Family education, Balance training, and Stair training  PLAN FOR NEXT SESSION: strength and balance R side, stepping over objects right side foot clearance focus.    Lite gait with R foot clearance focus and R step length   Lonni KATHEE Gainer, PT 09/04/2024, 5:04 PM

## 2024-09-06 ENCOUNTER — Ambulatory Visit: Admitting: Physical Therapy

## 2024-09-06 DIAGNOSIS — M6281 Muscle weakness (generalized): Secondary | ICD-10-CM

## 2024-09-06 DIAGNOSIS — R269 Unspecified abnormalities of gait and mobility: Secondary | ICD-10-CM

## 2024-09-06 DIAGNOSIS — R2689 Other abnormalities of gait and mobility: Secondary | ICD-10-CM

## 2024-09-06 DIAGNOSIS — R262 Difficulty in walking, not elsewhere classified: Secondary | ICD-10-CM

## 2024-09-06 NOTE — Therapy (Signed)
 OUTPATIENT PHYSICAL THERAPY NEURO TREATMENT   Patient Name: Shannon Chung MRN: [id number] DOB:1941/07/18, 83 y.o., male Today's Date: 09/06/2024   PCP: Gretel App, NP  REFERRING PROVIDER: Gretel App, NP   END OF SESSION:  PT End of Session - 09/06/24 0930     Visit Number 5    Number of Visits 24    Progress Note Due on Visit 20    PT Start Time 0927    PT Stop Time 1010    PT Time Calculation (min) 43 min    Equipment Utilized During Treatment Gait belt    Activity Tolerance Patient tolerated treatment well    Behavior During Therapy WFL for tasks assessed/performed            Past Medical History:  Diagnosis Date   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 06/2015 Cardiac CT: Ca score 1103 (84th %'ile);  b. 07/2015 Cath: LM 70, LAD 80p, 100/19m, D1 70, D2 95, RI 75, RCA 100p/m;  c. 07/2015 CABG x 5 (LIMA->LAD, VG->Diag, VG->OM1->OM2, VG->OM3).   COVID-19    12/2021   COVID-19 01/18/2022   COVID-19 vaccine administered 01/18/2022   Unknown how many vaccine doses have been received. Entered from Emergency Triage Note.   Diabetes mellitus without complication (HCC) 07/2015   Dyslipidemia    Essential hypertension    Essential hypertension 01/02/2015   Formatting of this note might be different from the original.  Last Assessment & Plan:   Chronic, stable. Continue current regimen.   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 dilated, EF 35% improved to 45-50% (2015);  b. 07/2015 EF 25-35% by LV gram.   Ischemic cardiomyopathy 01/02/2015   Kidney stones 04/17/2021   Lone atrial fibrillation (HCC) 1983   a. isolated episode, not on OAC.   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 06/2014: LV; resolved with coumadin-->no residual on f/u echo, no longer on coumadin.   Mural thrombus of heart 08/26/2021   Formatting of this note might be different from the original. Jun 16, 2019 Entered By: HICKS,KRISTIN DENISE  Comment: left ventricle, cardiac apex   Osteoarthritis    a. 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 Jun 16, 2019 Entered By: VINIE ALLEAN AQUAS Comment: leftAug 19, 2020 Entered By: VINIE ALLEAN AQUAS Comment: resolved by total left knee replacement Jun 16, 2019 Entered By: VINIE ALLEAN AQUAS Comment: leftAug 19, 2020 Entered By: VINIE ALLEAN AQUAS Comment: resolved by total left knee replacement   Trigger finger of left hand 07/07/2019   Vitamin D  deficiency    Past Surgical History:  Procedure Laterality Date   BICEPS TENDON REPAIR Right 1993   CARDIAC CATHETERIZATION N/A 07/05/2015   Procedure: Left Heart Cath and Coronary Angiography;  Surgeon: Evalene JINNY Lunger, MD;  Location: [facility] INVASIVE CV LAB;  Service: Cardiovascular;  Laterality: N/A;   CAROTID PTA/STENT INTERVENTION Left 04/26/2023   Procedure: CAROTID PTA/STENT INTERVENTION;  Surgeon: Marea Selinda RAMAN, MD;  Location: [facility] INVASIVE CV LAB;  Service: Cardiovascular;  Laterality: Left;   CATARACT EXTRACTION Left 12/2016   with keratoplasty   COLONOSCOPY  2007   COLONOSCOPY WITH PROPOFOL  N/A 12/02/2017   TA, SSA, rpt 3 yrs(Tahiliani, Varnita B, MD)   COLONOSCOPY WITH PROPOFOL  N/A 11/26/2020   Procedure: COLONOSCOPY WITH PROPOFOL ;  Surgeon: Jinny Carmine, MD;  Location: [facility] ENDOSCOPY;  Service: Endoscopy;  Laterality: N/A;   CORONARY ARTERY BYPASS GRAFT N/A 07/29/2015   Procedure: CORONARY ARTERY BYPASS GRAFTING (CABG) x 5 (LIMA to LAD, SVG to DIAGONAL,  SVG SEQUENTIALLY to OM1 and OM2, SVG to OM3) with Endoscopic Vein Havesting of  GREATER SAPHENOUS VEIN from RIGHT THIGH and partial LOWER LEG ;  Surgeon: Dorise MARLA Fellers, MD;  Location: [facility] OR;  Service: Open Heart Surgery;  Laterality: N/A;   EYE SURGERY     b/l cataract and cornea replaced    HAND SURGERY     left hand 1st/2nd trigger fingers Dr. Cleotilde ortho    JOINT REPLACEMENT     KNEE ARTHROSCOPY Left remote   MOHS SURGERY     left cheek scc 2022 Dr. Lloyd    MOHS SURGERY     x 5 facial scc   right biceps tendon     repair/re attachment    SKIN CANCER EXCISION  10/2015   BCC - L ala (pending MOHs) and L scapula (complete excision)   TEE WITHOUT CARDIOVERSION N/A 07/29/2015   Procedure: TRANSESOPHAGEAL ECHOCARDIOGRAM (TEE);  Surgeon: Dorise MARLA Fellers, MD;  Location[facility];  Service: Open Heart Surgery;  Laterality: N/A;   TONSILLECTOMY  1949   TOTAL KNEE ARTHROPLASTY Left 03/18/2016   cemented L TKR; Kayla Cleotilde, MD   Patient Active Problem List   Diagnosis Date Noted   Back pain 08/09/2024   Stroke-like symptoms 08/01/2024   Status post  placement of implantable loop recorder 07/12/2024   Seizure disorder (HCC) 06/28/2024   Annual physical exam 07/10/2023   TIA (transient ischemic attack) 05/02/2023   GERD without esophagitis 05/02/2023   Carotid stenosis, symptomatic, with infarction (HCC) 04/26/2023   History of CVA (cerebrovascular accident) without residual deficits 03/04/2023   Hemiparesis affecting right side as late effect of cerebrovascular accident (CVA) (HCC) 02/22/2023   Chronic radicular pain of lower back 08/10/2022   Cervical spondylosis 03/11/2022   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   Bilateral carotid artery stenosis 03/11/2022   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   Lumbar spondylosis 10/07/2021   DDD (degenerative disc disease), lumbar 10/07/2021   History of radiation therapy 08/26/2021   Aortic atherosclerosis 04/17/2021   Diverticulosis 04/17/2021   Hypertension associated with diabetes (HCC) 10/07/2020   Overweight (BMI 25.0-29.9) 10/07/2020   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   Vitamin D  deficiency 08/01/2020   Polyp of sigmoid colon 08/01/2020   Insomnia 06/07/2020   Degenerative joint disease of hand 03/01/2020   BPH (benign prostatic hyperplasia) 08/05/2018   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   Macular scar of both eyes 04/20/2018   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   Scotoma involving  central area of both eyes 04/20/2018   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   Fatty liver 03/25/2018   Erectile dysfunction 02/23/2017   Hx of CABG 01/24/2017   Advanced care planning/counseling discussion 10/21/2015   Coronary artery disease of native artery of native heart with stable angina pectoris    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  Rationale for Evaluation and Treatment: Rehabilitation  SUBJECTIVE:                                                                                                                                                                                             SUBJECTIVE STATEMENT: Today: Pt. Reports no changes since last session is ambulating with SPC today.   From eval: Pt had recent CVA. Pt was unable to get inpatient rehab but was able to get with home health. Pt had to go through a marathon with nurses and PT for home health. Pt did end up in PT with a HHPT he appreciated and decided he should come to outpatient services. Pt feels he is improving but is not at his pre morbid level. Pt using cane at the moment and  uses transport chair to get to clinic.   Pt accompanied by: self  PERTINENT HISTORY: CVA and hospital admission on 08/01/24.PMX of with history of left carotid artery stent, CAD status post 5 vessel CABG, dyslipidemia, hypertension, history of stereotypic spells   PAIN:  Are you having pain? No  PRECAUTIONS: Fall    WEIGHT BEARING RESTRICTIONS: No  FALLS: Has patient fallen in last 6 months? No  LIVING ENVIRONMENT: Lives with: lives with their spouse Lives in: House/apartment- Duplex Stairs: No Has following equipment at  home: Single point cane and Walker - 2 wheeled  PLOF: Independent and Independent with basic ADLs  PATIENT GOALS: improve gait, improve R LE strength, improve balance and mobility.   OBJECTIVE:  Note: Objective measures were completed at Evaluation unless otherwise noted.  DIAGNOSTIC FINDINGS: 1. Scattered patchy small volume acute ischemic nonhemorrhagic left MCA distribution infarcts as above. No associated mass effect. 2. Underlying chronic left frontoparietal infarct, with additional small remote lacunar infarcts about the left greater than right basal ganglia. 3. Underlying age-related cerebral atrophy with mild chronic small vessel ischemic disease.  COGNITION: Overall cognitive status: Within functional limits for tasks assessed   SENSATION: WFL   LOWER EXTREMITY ROM:   WNL for tasks assessed    LOWER EXTREMITY MMT:    MMT Right Eval Left Eval  Hip flexion 4 4+  Hip extension    Hip abduction 4 4+  Hip adduction 4+ 4+  Hip internal rotation    Hip external rotation    Knee flexion 4 4+  Knee extension 4 4+  Ankle dorsiflexion 4 4+  Ankle plantarflexion    Ankle inversion    Ankle eversion    (Blank rows = not tested)  BED MOBILITY:  Not tested  TRANSFERS: Sit to stand: Complete Independence  Assistive device utilized: Single point cane     Stand to sit: Modified independence  Assistive device utilized: Tree Surgeon to chair: Modified independence  Assistive device utilized: Counselling psychologist       GAIT: Findings: Gait Characteristics: decreased step length- Right, decreased step length- Left, decreased stride length, and decreased hip/knee flexion- Right, Distance walked: 30 ft, and Comments:    FUNCTIONAL TESTS:  5 times sit to stand: 21.23 sec Timed up and go (TUG): test visit 2  6 minute walk test: 554 ft with CGA  BERG 44 : .58 m/s  PATIENT SURVEYS:  SIS 16: 65.6 %                                                                                                                              TREATMENT DATE: 09/06/24  TA- To improve functional movements patterns for everyday tasks   Nustep level 4-7 x 8 min LE only for reciprocal large LE movement training.   STS from plinth table with focus on arm movement to improve ant weight shift in standing - pt  tends to have weight through heels 3 x 10   Standing R LE side step up with 2.5# AW donned 2 x 10 reps   Gait training - activities focussed on specific components of gait cycle and multimodal cueing for completion  Ladder walking x 5 laps focus on step length and width - again with 5# AW  Transition to hallway long walking (50-38ft) with focus on same form from above ladder stepping but with randomized intermittent stopping and restarting gait, after a few rounds unsuccessful placed golf club for step over with R LE for start of gait to implement hip and knee flexion to gait cycle   Unless otherwise stated, CGA was provided and gait belt donned in order to ensure pt safety  *1 lap = 1 time each way to return to start point    PATIENT EDUCATION: Education details: Pt educated throughout session about proper posture and technique with exercises. Improved exercise technique, movement at target joints, use of target muscles after min to mod verbal, visual, tactile cues. Person educated: Patient Education method: Explanation Education comprehension: verbalized understanding   HOME EXERCISE PROGRAM: Access Code: [email or web address] URL: [email or web address] Date: 08/28/2024 Prepared by: Lonni Gainer  Exercises - Sit to Stand Without Arm Support  - 1 x daily - 7 x weekly - 3 sets - 10 reps - Mini Lunge with Counter Support   - 1 x daily - 7 x weekly - 2 sets - 10 reps - Side Stepping with Resistance at Ankles and Counter Support  - 1 x daily - 7 x weekly - 2 sets - 10 reps - Marching Near Counter  - 1 x daily - 7 x weekly - 2 sets - 10  reps   SHORT TERM GOALS: Target date: 09/19/2024       Patient will be independent in home exercise program to improve strength/mobility for better functional independence with ADLs. Baseline: No HEP currently  Goal status: INITIAL   LONG TERM GOALS: Target date: 11/14/2024    1.  Patient will complete five times sit to stand test in < 15 seconds indicating an increased LE strength and improved balance. Baseline: 21.23 sec  Goal status: INITIAL  2.  Patient will improve SIS 16 score to 75   to demonstrate statistically significant improvement in mobility and quality of life as it relates to their CVA functional deficits.  Baseline: 65.6% Goal status: INITIAL   3.  Patient will increase Berg Balance score by > 6 points to demonstrate decreased fall risk during functional activities. Baseline: 44 Goal status: INITIAL   4.   Patient will reduce timed up and go to <11 seconds to reduce fall risk and demonstrate improved transfer/gait ability. Baseline: 13.11 sec  Goal status: INITIAL  5.   Patient will increase 10 meter walk test to >1.104m/s as to improve gait speed for better community ambulation and to reduce fall risk. Baseline: .58 m/s Goal status: INITIAL  6.   Patient will increase six minute walk test distance to >1000 for progression to community ambulator and improve gait ability Baseline: 554 ft with quad cane and CGA Goal status: INITIAL    ASSESSMENT:  CLINICAL IMPRESSION:  Patient arrived with good motivation for completion of pt activities. Continued with activities for improved foot clearance with gait and pt adequately challenged with this task. Improved carry over with gait with foot clearance after ladder dills and using stop and go strategy when gait deteriorated. Pt showing good progress with STS activity  but still requires cues for improved ant weight shift in standing. Pt will continue to benefit from skilled physical therapy intervention to address  impairments, improve QOL, and attain therapy goals.    OBJECTIVE IMPAIRMENTS: Abnormal gait, decreased activity tolerance, decreased balance, decreased endurance, decreased mobility, difficulty walking, decreased strength, and hypomobility.   ACTIVITY LIMITATIONS: standing, squatting, stairs, transfers, and locomotion level  PARTICIPATION LIMITATIONS: shopping, community activity, and yard work  PERSONAL FACTORS: Age and 3+ comorbidities:  with history of left carotid artery stent, CAD status post 5 vessel CABG, dyslipidemia, hypertension, history of stereotypic spells are also affecting patient's functional outcome.   REHAB POTENTIAL: Good  CLINICAL DECISION MAKING: Evolving/moderate complexity  EVALUATION COMPLEXITY: Moderate  PLAN:  PT FREQUENCY: 2x/week  PT DURATION: 12 weeks  PLANNED INTERVENTIONS: 97750- Physical Performance Testing, 97110-Therapeutic exercises, 97530- Therapeutic activity, [id number]- Neuromuscular re-education, 97535- Self Care, [id number]- Manual therapy, [phone]- Gait training, Patient/Family education, Balance training, and Stair training  PLAN FOR NEXT SESSION: strength and balance R side, stepping over objects right side foot clearance focus.    Lite gait with R foot clearance focus and R step length   Lonni KATHEE Gainer, PT 09/06/2024, 11:10 AM

## 2024-09-08 ENCOUNTER — Other Ambulatory Visit: Payer: Self-pay

## 2024-09-08 DIAGNOSIS — N529 Male erectile dysfunction, unspecified: Secondary | ICD-10-CM

## 2024-09-08 MED ORDER — SILDENAFIL CITRATE 20 MG PO TABS: 20.0000 mg | ORAL_TABLET | Freq: Every day | ORAL | 0 refills | Status: AC | PRN

## 2024-09-11 ENCOUNTER — Ambulatory Visit: Admitting: Physical Therapy

## 2024-09-11 DIAGNOSIS — R262 Difficulty in walking, not elsewhere classified: Secondary | ICD-10-CM

## 2024-09-11 DIAGNOSIS — R2689 Other abnormalities of gait and mobility: Secondary | ICD-10-CM

## 2024-09-11 DIAGNOSIS — M6281 Muscle weakness (generalized): Secondary | ICD-10-CM | POA: Diagnosis not present

## 2024-09-11 DIAGNOSIS — R269 Unspecified abnormalities of gait and mobility: Secondary | ICD-10-CM

## 2024-09-11 NOTE — Addendum Note (Signed)
 Addended by: EBB BRUCKNER B on: 09/11/2024 05:15 PM   Modules accepted: Orders

## 2024-09-11 NOTE — Therapy (Signed)
 OUTPATIENT PHYSICAL THERAPY NEURO TREATMENT   Patient Name: Shannon Chung MRN: [id number] DOB:1941-06-19, 83 y.o., male Today's Date: 09/11/2024   PCP: Gretel App, NP  REFERRING PROVIDER: Gretel App, NP   END OF SESSION:  PT End of Session - 09/11/24 1709     Visit Number 6    Number of Visits 24    Date for Recertification  11/15/23    Progress Note Due on Visit 20    PT Start Time 1530    PT Stop Time 1611    PT Time Calculation (min) 41 min    Equipment Utilized During Treatment Gait belt    Activity Tolerance Patient tolerated treatment well    Behavior During Therapy [facility] for tasks assessed/performed             Past Medical History:  Diagnosis Date   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 06/2015 Cardiac CT: Ca score 1103 (84th %'ile);  b. 07/2015 Cath: LM 70, LAD 80p, 100/85m, D1 70, D2 95, RI 75, RCA 100p/m;  c. 07/2015 CABG x 5 (LIMA->LAD, VG->Diag, VG->OM1->OM2, VG->OM3).   COVID-19    12/2021   COVID-19 01/18/2022   COVID-19 vaccine administered 01/18/2022   Unknown how many vaccine doses have been received. Entered from Emergency Triage Note.   Diabetes mellitus without complication (HCC) 07/2015   Dyslipidemia    Essential hypertension    Essential hypertension 01/02/2015   Formatting of this note might be different from the original.  Last Assessment & Plan:   Chronic, stable. Continue current regimen.   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 dilated, EF 35% improved to 45-50% (2015);  b. 07/2015 EF 25-35% by LV gram.   Ischemic cardiomyopathy 01/02/2015   Kidney stones 04/17/2021   Lone atrial fibrillation (HCC) 1983   a. isolated episode, not on OAC.   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 06/2014: LV; resolved with coumadin-->no residual on f/u echo, no longer on coumadin.   Mural thrombus of heart 08/26/2021   Formatting of this note might be different from  the original. Jun 16, 2019 Entered By: VINIE ALLEAN AQUAS Comment: left ventricle, cardiac apex   Osteoarthritis    a. 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 Jun 16, 2019 Entered By: VINIE ALLEAN AQUAS Comment: leftAug 19, 2020 Entered By: VINIE ALLEAN AQUAS Comment: resolved by total left knee replacement Jun 16, 2019 Entered By: VINIE ALLEAN AQUAS Comment: leftAug 19, 2020 Entered By: VINIE ALLEAN AQUAS Comment: resolved by total left knee replacement   Trigger finger of left hand 07/07/2019   Vitamin D  deficiency    Past Surgical History:  Procedure Laterality Date   BICEPS TENDON REPAIR Right 1993   CARDIAC CATHETERIZATION N/A 07/05/2015   Procedure: Left Heart Cath and Coronary Angiography;  Surgeon: Evalene JINNY Lunger, MD;  Location: [facility] INVASIVE CV LAB;  Service: Cardiovascular;  Laterality: N/A;   CAROTID PTA/STENT INTERVENTION Left 04/26/2023   Procedure: CAROTID PTA/STENT INTERVENTION;  Surgeon: Marea Selinda RAMAN, MD;  Location: [facility] INVASIVE CV LAB;  Service: Cardiovascular;  Laterality: Left;   CATARACT EXTRACTION Left 12/2016   with keratoplasty   COLONOSCOPY  2007   COLONOSCOPY WITH PROPOFOL  N/A 12/02/2017   TA, SSA, rpt 3 yrs(Tahiliani, Varnita B, MD)   COLONOSCOPY WITH PROPOFOL  N/A 11/26/2020   Procedure: COLONOSCOPY WITH PROPOFOL ;  Surgeon: Jinny Carmine, MD;  Location: [facility] ENDOSCOPY;  Service: Endoscopy;  Laterality: N/A;   CORONARY ARTERY BYPASS GRAFT N/A 07/29/2015   Procedure: CORONARY ARTERY BYPASS GRAFTING (CABG) x 5 (LIMA to LAD, SVG to DIAGONAL,  SVG SEQUENTIALLY to OM1 and OM2, SVG to OM3) with Endoscopic Vein Havesting of  GREATER SAPHENOUS VEIN from RIGHT THIGH and partial LOWER LEG ;  Surgeon: Dorise MARLA Fellers, MD;  Location: [facility] OR;  Service: Open Heart Surgery;  Laterality: N/A;   EYE SURGERY     b/l cataract and cornea replaced    HAND SURGERY     left hand 1st/2nd trigger fingers Dr. Cleotilde ortho    JOINT REPLACEMENT     KNEE ARTHROSCOPY Left remote   MOHS SURGERY      left cheek scc 2022 Dr. Lloyd   MOHS SURGERY     x 5 facial scc   right biceps tendon     repair/re attachment    SKIN CANCER EXCISION  10/2015   BCC - L ala (pending MOHs) and L scapula (complete excision)   TEE WITHOUT CARDIOVERSION N/A 07/29/2015   Procedure: TRANSESOPHAGEAL ECHOCARDIOGRAM (TEE);  Surgeon: Dorise MARLA Fellers, MD;  Location: [facility] OR;  Service: Open Heart Surgery;  Laterality: N/A;   TONSILLECTOMY  1949   TOTAL KNEE ARTHROPLASTY Left 03/18/2016   cemented L TKR; Kayla Cleotilde, MD   Patient Active Problem List   Diagnosis Date Noted   Back pain 08/09/2024  Stroke-like symptoms 08/01/2024   Status post placement of implantable loop recorder 07/12/2024   Seizure disorder (HCC) 06/28/2024   Annual physical exam 07/10/2023   TIA (transient ischemic attack) 05/02/2023   GERD without esophagitis 05/02/2023   Carotid stenosis, symptomatic, with infarction (HCC) 04/26/2023   History of CVA (cerebrovascular accident) without residual deficits 03/04/2023   Hemiparesis affecting right side as late effect of cerebrovascular accident (CVA) (HCC) 02/22/2023   Chronic radicular pain of lower back 08/10/2022   Cervical spondylosis 03/11/2022   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   Bilateral carotid artery stenosis 03/11/2022   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   Lumbar spondylosis 10/07/2021   DDD (degenerative disc disease), lumbar 10/07/2021   History of radiation therapy 08/26/2021   Aortic atherosclerosis 04/17/2021   Diverticulosis 04/17/2021   Hypertension associated with diabetes (HCC) 10/07/2020   Overweight (BMI 25.0-29.9) 10/07/2020   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   Vitamin D  deficiency 08/01/2020   Polyp of sigmoid colon 08/01/2020   Insomnia 06/07/2020   Degenerative joint disease of hand 03/01/2020   BPH (benign prostatic hyperplasia) 08/05/2018   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   Macular scar of both eyes 04/20/2018   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   Scotoma involving central area of both eyes 04/20/2018   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   Fatty liver 03/25/2018   Erectile dysfunction 02/23/2017   Hx of CABG 01/24/2017   Advanced care planning/counseling discussion 10/21/2015   Coronary artery disease of native artery of native heart with stable angina pectoris    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  Rationale for Evaluation and Treatment: Rehabilitation  SUBJECTIVE:                                                                                                                                                                                             SUBJECTIVE STATEMENT: Today: Pt. Reports no changes since last session.  From eval: Pt had recent CVA. Pt was unable to get inpatient rehab but was able to get with home health. Pt had to go through a marathon with nurses and PT for home health. Pt did end up in PT with a HHPT he appreciated and decided he should come to outpatient services. Pt feels he is improving but is not at his pre morbid level. Pt using cane at the moment  and uses transport chair to get to clinic.   Pt accompanied by: self  PERTINENT HISTORY: CVA and hospital admission on 08/01/24.PMX of with history of left carotid artery stent, CAD status post 5 vessel CABG, dyslipidemia, hypertension, history of stereotypic spells   PAIN:  Are you having pain? No  PRECAUTIONS: Fall    WEIGHT BEARING RESTRICTIONS: No  FALLS: Has patient fallen in last 6 months? No  LIVING ENVIRONMENT: Lives with: lives with their spouse Lives in: House/apartment- Duplex Stairs: No Has following equipment at  home: Single point cane and Walker - 2 wheeled  PLOF: Independent and Independent with basic ADLs  PATIENT GOALS: improve gait, improve R LE strength, improve balance and mobility.   OBJECTIVE:  Note: Objective measures were completed at Evaluation unless otherwise noted.  DIAGNOSTIC FINDINGS: 1. Scattered patchy small volume acute ischemic nonhemorrhagic left MCA distribution infarcts as above. No associated mass effect. 2. Underlying chronic left frontoparietal infarct, with additional small remote lacunar infarcts about the left greater than right basal ganglia. 3. Underlying age-related cerebral atrophy with mild chronic small vessel ischemic disease.  COGNITION: Overall cognitive status: Within functional limits for tasks assessed   SENSATION: WFL   LOWER EXTREMITY ROM:   WNL for tasks assessed    LOWER EXTREMITY MMT:    MMT Right Eval Left Eval  Hip flexion 4 4+  Hip extension    Hip abduction 4 4+  Hip adduction 4+ 4+  Hip internal rotation    Hip external rotation    Knee flexion 4 4+  Knee extension 4 4+  Ankle dorsiflexion 4 4+  Ankle plantarflexion    Ankle inversion    Ankle eversion    (Blank rows = not tested)  BED MOBILITY:  Not tested  TRANSFERS: Sit to stand: Complete Independence  Assistive device utilized: Single point cane     Stand to sit: Modified independence  Assistive device utilized: Tree Surgeon to chair: Modified independence  Assistive device utilized: Counselling psychologist       GAIT: Findings: Gait Characteristics: decreased step length- Right, decreased step length- Left, decreased stride length, and decreased hip/knee flexion- Right, Distance walked: 30 ft, and Comments:    FUNCTIONAL TESTS:  5 times sit to stand: 21.23 sec Timed up and go (TUG): test visit 2  6 minute walk test: 554 ft with CGA  BERG 44 : .58 m/s  PATIENT SURVEYS:  SIS 16: 65.6 %                                                                                                                              TREATMENT DATE: 09/11/24   TA- To improve functional movements patterns for everyday tasks   Nustep level 4-8 on rolling hills mode x 8 min LE only for reciprocal large LE movement training and LE endurance.   STS from plinth table with focus on arm movement  to improve ant weight shift in standing - pt tends to have weight through heels, gradual decrease in table height throughout for increased challenge at lower depth 3 x 10   Sidestepping around plinth along 3 sides ( blue topped plinth)x 2 laps, rest then another round   Ladder drill: facing parallel to long axis of ladder - forward stepping into ladder then side and retro step out then repeat in next ladder rung x 2 laps   Ladder drill: icky shuffle- lateral step in then lateral step out then lateral step to next rung, facing normal way in ladder. X 2 laps   *1 lap = 1 time each way to return to start point   Gait training - activities focussed on specific components of gait cycle and multimodal cueing for completion  Gait with focus on R LE foot clearance x 300 ft, towards end R LE foot clearance deteriorated but at start of bout pt foot clearance improved from baseline  Unless otherwise stated, CGA was provided and gait belt donned in order to ensure pt safety    PATIENT EDUCATION: Education details: Pt educated throughout session about proper posture and technique with exercises. Improved exercise technique, movement at target joints, use of target muscles after min to mod verbal, visual, tactile cues. Person educated: Patient Education method: Explanation Education comprehension: verbalized understanding   HOME EXERCISE PROGRAM: Access Code: [email or web address] URL: [email or web address] Date: 08/28/2024 Prepared by: Lonni Gainer  Exercises - Sit to Stand Without Arm Support  - 1 x daily - 7 x weekly - 3 sets - 10 reps - Mini Lunge with  Counter Support   - 1 x daily - 7 x weekly - 2 sets - 10 reps - Side Stepping with Resistance at Ankles and Counter Support  - 1 x daily - 7 x weekly - 2 sets - 10 reps - Marching Near Counter  - 1 x daily - 7 x weekly - 2 sets - 10 reps   SHORT TERM GOALS: Target date: 09/19/2024       Patient will be independent in home exercise program to improve strength/mobility for better functional independence with ADLs. Baseline: No HEP currently  Goal status: INITIAL   LONG TERM GOALS: Target date: 11/14/2024    1.  Patient will complete five times sit to stand test in < 15 seconds indicating an increased LE strength and improved balance. Baseline: 21.23 sec  Goal status: INITIAL  2.  Patient will improve SIS 16 score to 75   to demonstrate statistically significant improvement in mobility and quality of life as it relates to their CVA functional deficits.  Baseline: 65.6% Goal status: INITIAL   3.  Patient will increase Berg Balance score by > 6 points to demonstrate decreased fall risk during functional activities. Baseline: 44 Goal status: INITIAL   4.   Patient will reduce timed up and go to <11 seconds to reduce fall risk and demonstrate improved transfer/gait ability. Baseline: 13.11 sec  Goal status: INITIAL  5.   Patient will increase 10 meter walk test to >1.91m/s as to improve gait speed for better community ambulation and to reduce fall risk. Baseline: .58 m/s Goal status: INITIAL  6.   Patient will increase six minute walk test distance to >1000 for progression to community ambulator and improve gait ability Baseline: 554 ft with quad cane and CGA Goal status: INITIAL    ASSESSMENT:  CLINICAL IMPRESSION:  Patient arrived with good motivation for completion of pt activities.  Continued with activities for improved foot clearance with gait and pt adequately challenged with this task. Pt encouraged to practice LE foot clearance activities at home for improved carry  over. Pt will continue to benefit from skilled physical therapy intervention to address impairments, improve QOL, and attain therapy goals.    OBJECTIVE IMPAIRMENTS: Abnormal gait, decreased activity tolerance, decreased balance, decreased endurance, decreased mobility, difficulty walking, decreased strength, and hypomobility.   ACTIVITY LIMITATIONS: standing, squatting, stairs, transfers, and locomotion level  PARTICIPATION LIMITATIONS: shopping, community activity, and yard work  PERSONAL FACTORS: Age and 3+ comorbidities:  with history of left carotid artery stent, CAD status post 5 vessel CABG, dyslipidemia, hypertension, history of stereotypic spells are also affecting patient's functional outcome.   REHAB POTENTIAL: Good  CLINICAL DECISION MAKING: Evolving/moderate complexity  EVALUATION COMPLEXITY: Moderate  PLAN:  PT FREQUENCY: 2x/week  PT DURATION: 12 weeks  PLANNED INTERVENTIONS: 97750- Physical Performance Testing, 97110-Therapeutic exercises, 97530- Therapeutic activity, [id number]- Neuromuscular re-education, 97535- Self Care, [id number]- Manual therapy, [phone]- Gait training, Patient/Family education, Balance training, and Stair training  PLAN FOR NEXT SESSION: strength and balance R side, stepping over objects right side foot clearance focus.    Lite gait with R foot clearance focus and R step length   Note: Portions of this document were prepared using Dragon voice recognition software and although reviewed may contain unintentional dictation errors in syntax, grammar, or spelling.  Lonni KATHEE Gainer PT ,DPT Physical Therapist- [place]  [facility] [place]

## 2024-09-13 ENCOUNTER — Ambulatory Visit: Admitting: Physical Therapy

## 2024-09-13 DIAGNOSIS — M6281 Muscle weakness (generalized): Secondary | ICD-10-CM

## 2024-09-13 DIAGNOSIS — R2689 Other abnormalities of gait and mobility: Secondary | ICD-10-CM

## 2024-09-13 DIAGNOSIS — R262 Difficulty in walking, not elsewhere classified: Secondary | ICD-10-CM

## 2024-09-13 DIAGNOSIS — R269 Unspecified abnormalities of gait and mobility: Secondary | ICD-10-CM

## 2024-09-13 NOTE — Therapy (Signed)
 OUTPATIENT PHYSICAL THERAPY NEURO TREATMENT   Patient Name: Shannon Chung MRN: [id number] DOB:[birth year], 83 y.o., male Today's Date: 09/13/2024   PCP: Gretel App, NP  REFERRING PROVIDER: Gretel App, NP   END OF SESSION:  PT End of Session - 09/13/24 1131     Visit Number 7    Number of Visits 24    Date for Recertification  11/15/23    Progress Note Due on Visit 20    PT Start Time 1128    PT Stop Time 1211    PT Time Calculation (min) 43 min    Equipment Utilized During Treatment Gait belt    Activity Tolerance Patient tolerated treatment well    Behavior During Therapy WFL for tasks assessed/performed             Past Medical History:  Diagnosis Date   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 06/2015 Cardiac CT: Ca score 1103 (84th %'ile);  b. 07/2015 Cath: LM 70, LAD 80p, 100/43m, D1 70, D2 95, RI 75, RCA 100p/m;  c. 07/2015 CABG x 5 (LIMA->LAD, VG->Diag, VG->OM1->OM2, VG->OM3).   COVID-19    12/2021   COVID-19 01/18/2022   COVID-19 vaccine administered 01/18/2022   Unknown how many vaccine doses have been received. Entered from Emergency Triage Note.   Diabetes mellitus without complication (HCC) 07/2015   Dyslipidemia    Essential hypertension    Essential hypertension 01/02/2015   Formatting of this note might be different from the original.  Last Assessment & Plan:   Chronic, stable. Continue current regimen.   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 dilated, EF 35% improved to 45-50% (2015);  b. 07/2015 EF 25-35% by LV gram.   Ischemic cardiomyopathy 01/02/2015   Kidney stones 04/17/2021   Lone atrial fibrillation (HCC) 1983   a. isolated episode, not on OAC.   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 06/2014: LV; resolved with coumadin-->no residual on f/u echo, no longer on coumadin.   Mural thrombus of heart 08/26/2021   Formatting of this note might be different from  the original. Jun 16, 2019 Entered By: VINIE ALLEAN AQUAS Comment: left ventricle, cardiac apex   Osteoarthritis    a. 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 Jun 16, 2019 Entered By: VINIE ALLEAN AQUAS Comment: leftAug 19, 2020 Entered By: VINIE ALLEAN AQUAS Comment: resolved by total left knee replacement Jun 16, 2019 Entered By: VINIE ALLEAN AQUAS Comment: leftAug 19, 2020 Entered By: VINIE ALLEAN AQUAS Comment: resolved by total left knee replacement   Trigger finger of left hand 07/07/2019   Vitamin D  deficiency    Past Surgical History:  Procedure Laterality Date   BICEPS TENDON REPAIR Right 1993   CARDIAC CATHETERIZATION N/A 07/05/2015   Procedure: Left Heart Cath and Coronary Angiography;  Surgeon: Evalene JINNY Lunger, MD;  Location: [facility] INVASIVE CV LAB;  Service: Cardiovascular;  Laterality: N/A;   CAROTID PTA/STENT INTERVENTION Left 04/26/2023   Procedure: CAROTID PTA/STENT INTERVENTION;  Surgeon: Marea Selinda RAMAN, MD;  Location: [facility] INVASIVE CV LAB;  Service: Cardiovascular;  Laterality: Left;   CATARACT EXTRACTION Left 12/2016   with keratoplasty   COLONOSCOPY  2007   COLONOSCOPY WITH PROPOFOL  N/A 12/02/2017   TA, SSA, rpt 3 yrs(Tahiliani, Varnita B, MD)   COLONOSCOPY WITH PROPOFOL  N/A 11/26/2020   Procedure: COLONOSCOPY WITH PROPOFOL ;  Surgeon: Jinny Carmine, MD;  Location: [facility] ENDOSCOPY;  Service: Endoscopy;  Laterality: N/A;   CORONARY ARTERY BYPASS GRAFT N/A 07/29/2015   Procedure: CORONARY ARTERY BYPASS GRAFTING (CABG) x 5 (LIMA to LAD, SVG to DIAGONAL,  SVG SEQUENTIALLY to OM1 and OM2, SVG to OM3) with Endoscopic Vein Havesting of  GREATER SAPHENOUS VEIN from RIGHT THIGH and partial LOWER LEG ;  Surgeon: Dorise MARLA Fellers, MD;  Location: [facility] OR;  Service: Open Heart Surgery;  Laterality: N/A;   EYE SURGERY     b/l cataract and cornea replaced    HAND SURGERY     left hand 1st/2nd trigger fingers Dr. Cleotilde ortho    JOINT REPLACEMENT     KNEE ARTHROSCOPY Left remote   MOHS SURGERY      left cheek scc 2022 Dr. Lloyd   MOHS SURGERY     x 5 facial scc   right biceps tendon     repair/re attachment    SKIN CANCER EXCISION  10/2015   BCC - L ala (pending MOHs) and L scapula (complete excision)   TEE WITHOUT CARDIOVERSION N/A 07/29/2015   Procedure: TRANSESOPHAGEAL ECHOCARDIOGRAM (TEE);  Surgeon: Dorise MARLA Fellers, MD;  Location[facility] OR;  Service: Open Heart Surgery;  Laterality: N/A;   TONSILLECTOMY  1949   TOTAL KNEE ARTHROPLASTY Left 03/18/2016   cemented L TKR; Kayla Cleotilde, MD   Patient Active Problem List   Diagnosis Date Noted   Back pain 08/09/2024  Stroke-like symptoms 08/01/2024   Status post placement of implantable loop recorder 07/12/2024   Seizure disorder (HCC) 06/28/2024   Annual physical exam 07/10/2023   TIA (transient ischemic attack) 05/02/2023   GERD without esophagitis 05/02/2023   Carotid stenosis, symptomatic, with infarction (HCC) 04/26/2023   History of CVA (cerebrovascular accident) without residual deficits 03/04/2023   Hemiparesis affecting right side as late effect of cerebrovascular accident (CVA) (HCC) 02/22/2023   Chronic radicular pain of lower back 08/10/2022   Cervical spondylosis 03/11/2022   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   Bilateral carotid artery stenosis 03/11/2022   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   Lumbar spondylosis 10/07/2021   DDD (degenerative disc disease), lumbar 10/07/2021   History of radiation therapy 08/26/2021   Aortic atherosclerosis 04/17/2021   Diverticulosis 04/17/2021   Hypertension associated with diabetes (HCC) 10/07/2020   Overweight (BMI 25.0-29.9) 10/07/2020   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   Vitamin D  deficiency 08/01/2020   Polyp of sigmoid colon 08/01/2020   Insomnia 06/07/2020   Degenerative joint disease of hand 03/01/2020   BPH (benign prostatic hyperplasia) 08/05/2018   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   Macular scar of both eyes 04/20/2018   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   Scotoma involving central area of both eyes 04/20/2018   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   Fatty liver 03/25/2018   Erectile dysfunction 02/23/2017   Hx of CABG 01/24/2017   Advanced care planning/counseling discussion 10/21/2015   Coronary artery disease of native artery of native heart with stable angina pectoris    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   Dyslipidemia 01/02/2015   Osteoarthritis 01/02/2015   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/02/2014    ONSET DATE: 08/01/24  REFERRING DIAG: Z86.73 (ICD-10-CM) - History of CVA (cerebrovascular accident)   THERAPY DIAG:  Muscle weakness (generalized)  Difficulty in walking, not elsewhere classified  Abnormality of gait and mobility  Other abnormalities of gait and mobility  Rationale for Evaluation and Treatment: Rehabilitation  SUBJECTIVE:                                                                                                                                                                                             SUBJECTIVE STATEMENT: Today: Pt. Reports no changes since last session. Been trying to walk with improved form as he has been practicing in PT.   From eval: Pt had recent CVA. Pt was unable to get inpatient rehab but was able to get with home health. Pt had to go through a marathon with nurses and PT for home health. Pt did end  up in PT with a HHPT he appreciated and decided he should come to outpatient services. Pt feels he is improving but is not at his pre morbid level. Pt using cane at the moment and uses transport chair to get to clinic.   Pt accompanied by: self  PERTINENT HISTORY: CVA and hospital admission on 08/01/24.PMX of with history of left carotid artery stent, CAD status post 5 vessel CABG, dyslipidemia, hypertension, history of stereotypic spells   PAIN:  Are you having pain? No  PRECAUTIONS:  Fall    WEIGHT BEARING RESTRICTIONS: No  FALLS: Has patient fallen in last 6 months? No  LIVING ENVIRONMENT: Lives with: lives with their spouse Lives in: House/apartment- Duplex Stairs: No Has following equipment at home: Single point cane and Walker - 2 wheeled  PLOF: Independent and Independent with basic ADLs  PATIENT GOALS: improve gait, improve R LE strength, improve balance and mobility.   OBJECTIVE:  Note: Objective measures were completed at Evaluation unless otherwise noted.  DIAGNOSTIC FINDINGS: 1. Scattered patchy small volume acute ischemic nonhemorrhagic left MCA distribution infarcts as above. No associated mass effect. 2. Underlying chronic left frontoparietal infarct, with additional small remote lacunar infarcts about the left greater than right basal ganglia. 3. Underlying age-related cerebral atrophy with mild chronic small vessel ischemic disease.  COGNITION: Overall cognitive status: Within functional limits for tasks assessed   SENSATION: WFL   LOWER EXTREMITY ROM:   WNL for tasks assessed    LOWER EXTREMITY MMT:    MMT Right Eval Left Eval  Hip flexion 4 4+  Hip extension    Hip abduction 4 4+  Hip adduction 4+ 4+  Hip internal rotation    Hip external rotation    Knee flexion 4 4+  Knee extension 4 4+  Ankle dorsiflexion 4 4+  Ankle plantarflexion    Ankle inversion    Ankle eversion    (Blank rows = not tested)  BED MOBILITY:  Not tested  TRANSFERS: Sit to stand: Complete Independence  Assistive device utilized: Single point cane     Stand to sit: Modified independence  Assistive device utilized: Tree Surgeon to chair: Modified independence  Assistive device utilized: Counselling psychologist       GAIT: Findings: Gait Characteristics: decreased step length- Right, decreased step length- Left, decreased stride length, and decreased hip/knee flexion- Right, Distance walked: 30 ft, and Comments:    FUNCTIONAL  TESTS:  5 times sit to stand: 21.23 sec Timed up and go (TUG): test visit 2  6 minute walk test: 554 ft with CGA  BERG 44 : .58 m/s  PATIENT SURVEYS:  SIS 16: 65.6 %                                                                                                                             TREATMENT DATE: 09/13/24   TA- To improve functional movements patterns for everyday  tasks   Nustep level 4-8 on rolling hills mode x 8 min LE only for reciprocal large LE movement training and LE endurance.   STS from plinth table with focus on arm movement to improve ant weight shift in standing - pt tends to have weight through heels, gradual decrease in table height throughout for increased challenge at lower depth 3 x 10 - improved UE use this session   Lateral step over orange hurdle to airex 2 x 10 with UE support R side leading   Forward hurdle step over to airex pad and retro step over hurdle on return x 10 leading with RLE   Forward stepping over various obstacles ( 4 different obstacles. 2 X 4 laps   *1 lap = 1 time each way to return to start point  Gait training - activities focussed on specific components of gait cycle and multimodal cueing for completion  Gait with focus on R LE foot clearance x 475 ft, towards end R LE foot clearance deteriorated but at start of bout pt foot clearance improved from baseline. Improved foot clearance with turns.   Unless otherwise stated, CGA was provided and gait belt donned in order to ensure pt safety    PATIENT EDUCATION: Education details: Pt educated throughout session about proper posture and technique with exercises. Improved exercise technique, movement at target joints, use of target muscles after min to mod verbal, visual, tactile cues. Person educated: Patient Education method: Explanation Education comprehension: verbalized understanding   HOME EXERCISE PROGRAM: Access Code: [email or web address] URL:  [email or web address] Date: 08/28/2024 Prepared by: Lonni Gainer  Exercises - Sit to Stand Without Arm Support  - 1 x daily - 7 x weekly - 3 sets - 10 reps - Mini Lunge with Counter Support   - 1 x daily - 7 x weekly - 2 sets - 10 reps - Side Stepping with Resistance at Ankles and Counter Support  - 1 x daily - 7 x weekly - 2 sets - 10 reps - Marching Near Counter  - 1 x daily - 7 x weekly - 2 sets - 10 reps   SHORT TERM GOALS: Target date: 09/19/2024       Patient will be independent in home exercise program to improve strength/mobility for better functional independence with ADLs. Baseline: No HEP currently  Goal status: INITIAL   LONG TERM GOALS: Target date: 11/14/2024    1.  Patient will complete five times sit to stand test in < 15 seconds indicating an increased LE strength and improved balance. Baseline: 21.23 sec  Goal status: INITIAL  2.  Patient will improve SIS 16 score to 75   to demonstrate statistically significant improvement in mobility and quality of life as it relates to their CVA functional deficits.  Baseline: 65.6% Goal status: INITIAL   3.  Patient will increase Berg Balance score by > 6 points to demonstrate decreased fall risk during functional activities. Baseline: 44 Goal status: INITIAL   4.   Patient will reduce timed up and go to <11 seconds to reduce fall risk and demonstrate improved transfer/gait ability. Baseline: 13.11 sec  Goal status: INITIAL  5.   Patient will increase 10 meter walk test to >1.40m/s as to improve gait speed for better community ambulation and to reduce fall risk. Baseline: .58 m/s Goal status: INITIAL  6.   Patient will increase six minute walk test distance to >1000 for progression to community ambulator and improve gait ability Baseline: 554 ft with quad  cane and CGA Goal status: INITIAL    ASSESSMENT:  CLINICAL IMPRESSION:  Patient arrived with good motivation for completion of pt  activities. Continued with activities for improved foot clearance with gait and pt adequately challenged with this task. Pt encouraged to practice LE foot clearance activities at home for improved carry over. Pt will continue to benefit from skilled physical therapy intervention to address impairments, improve QOL, and attain therapy goals.    OBJECTIVE IMPAIRMENTS: Abnormal gait, decreased activity tolerance, decreased balance, decreased endurance, decreased mobility, difficulty walking, decreased strength, and hypomobility.   ACTIVITY LIMITATIONS: standing, squatting, stairs, transfers, and locomotion level  PARTICIPATION LIMITATIONS: shopping, community activity, and yard work  PERSONAL FACTORS: Age and 3+ comorbidities:  with history of left carotid artery stent, CAD status post 5 vessel CABG, dyslipidemia, hypertension, history of stereotypic spells are also affecting patient's functional outcome.   REHAB POTENTIAL: Good  CLINICAL DECISION MAKING: Evolving/moderate complexity  EVALUATION COMPLEXITY: Moderate  PLAN:  PT FREQUENCY: 2x/week  PT DURATION: 12 weeks  PLANNED INTERVENTIONS: 97750- Physical Performance Testing, 97110-Therapeutic exercises, 97530- Therapeutic activity, [id number]- Neuromuscular re-education, 97535- Self Care, [id number]- Manual therapy, [phone]- Gait training, Patient/Family education, Balance training, and Stair training  PLAN FOR NEXT SESSION:   strength and balance R side, stepping over objects right side foot clearance focus.    Lite gait with R foot clearance focus and R step length   Note: Portions of this document were prepared using Dragon voice recognition software and although reviewed may contain unintentional dictation errors in syntax, grammar, or spelling.  Lonni KATHEE Gainer PT ,DPT Physical Therapist- [place]  [facility]

## 2024-09-18 ENCOUNTER — Ambulatory Visit

## 2024-09-18 DIAGNOSIS — R269 Unspecified abnormalities of gait and mobility: Secondary | ICD-10-CM

## 2024-09-18 DIAGNOSIS — R2689 Other abnormalities of gait and mobility: Secondary | ICD-10-CM

## 2024-09-18 DIAGNOSIS — R262 Difficulty in walking, not elsewhere classified: Secondary | ICD-10-CM

## 2024-09-18 DIAGNOSIS — M6281 Muscle weakness (generalized): Secondary | ICD-10-CM | POA: Diagnosis not present

## 2024-09-18 NOTE — Therapy (Signed)
 OUTPATIENT PHYSICAL THERAPY TREATMENT  Patient Name: Shannon Chung MRN: [id number] DOB:Jul 20, 1941, 83 y.o., male Today's Date: 09/18/2024  PCP: Gretel App, NP  REFERRING PROVIDER: Gretel App, NP  END OF SESSION:  PT End of Session - 09/18/24 1533     Visit Number 8    Number of Visits 24    Date for Recertification  11/15/23    Authorization Type [employer] Medicare    Authorization Time Period 10/21-1/13/26 for 24 visits    Progress Note Due on Visit 20    PT Start Time 1530    PT Stop Time 1610    PT Time Calculation (min) 40 min    Equipment Utilized During Treatment Gait belt    Activity Tolerance Patient tolerated treatment well;No increased pain    Behavior During Therapy [facility] for tasks assessed/performed           Past Medical History:  Diagnosis Date   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 06/2015 Cardiac CT: Ca score 1103 (84th %'ile);  b. 07/2015 Cath: LM 70, LAD 80p, 100/40m, D1 70, D2 95, RI 75, RCA 100p/m;  c. 07/2015 CABG x 5 (LIMA->LAD, VG->Diag, VG->OM1->OM2, VG->OM3).   COVID-19    12/2021   COVID-19 01/18/2022   COVID-19 vaccine administered 01/18/2022   Unknown how many vaccine doses have been received. Entered from Emergency Triage Note.   Diabetes mellitus without complication (HCC) 07/2015   Dyslipidemia    Essential hypertension    Essential hypertension 01/02/2015   Formatting of this note might be different from the original.  Last Assessment & Plan:   Chronic, stable. Continue current regimen.   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 dilated, EF 35% improved to 45-50% (2015);  b. 07/2015 EF 25-35% by LV gram.   Ischemic cardiomyopathy 01/02/2015   Kidney stones 04/17/2021   Lone atrial fibrillation (HCC) 1983   a. isolated episode, not on OAC.   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 06/2014: LV; resolved with coumadin-->no residual on f/u echo, no longer on coumadin.   Mural thrombus  of heart 08/26/2021   Formatting of this note might be different from the original. Jun 16, 2019 Entered By: VINIE ALLEAN AQUAS Comment: left ventricle, cardiac apex   Osteoarthritis    a. 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 Jun 16, 2019 Entered By: VINIE ALLEAN AQUAS Comment: leftAug 19, 2020 Entered By: VINIE ALLEAN AQUAS Comment: resolved by total left knee replacement Jun 16, 2019 Entered By: VINIE ALLEAN AQUAS Comment: leftAug 19, 2020 Entered By: VINIE ALLEAN AQUAS Comment: resolved by total left knee replacement   Trigger finger of left hand 07/07/2019   Vitamin D  deficiency    Past Surgical History:  Procedure Laterality Date   BICEPS TENDON REPAIR Right 1993   CARDIAC CATHETERIZATION N/A 07/05/2015   Procedure: Left Heart Cath and Coronary Angiography;  Surgeon: Evalene JINNY Lunger, MD;  Location: [facility] INVASIVE CV LAB;  Service: Cardiovascular;  Laterality: N/A;   CAROTID PTA/STENT INTERVENTION Left 04/26/2023   Procedure: CAROTID PTA/STENT INTERVENTION;  Surgeon: Marea Selinda RAMAN, MD;  Location: [facility] INVASIVE CV LAB;  Service: Cardiovascular;  Laterality: Left;   CATARACT EXTRACTION Left 12/2016   with keratoplasty   COLONOSCOPY  2007   COLONOSCOPY WITH PROPOFOL  N/A 12/02/2017   TA, SSA, rpt 3 yrs(Tahiliani, Varnita B, MD)   COLONOSCOPY WITH PROPOFOL  N/A 11/26/2020   Procedure: COLONOSCOPY WITH PROPOFOL ;  Surgeon: Jinny Carmine, MD;  Location: [facility] ENDOSCOPY;  Service: Endoscopy;  Laterality: N/A;   CORONARY ARTERY BYPASS GRAFT N/A 07/29/2015   Procedure: CORONARY ARTERY BYPASS GRAFTING (CABG) x 5 (LIMA to LAD, SVG to DIAGONAL,  SVG SEQUENTIALLY to OM1 and OM2, SVG to OM3) with Endoscopic Vein Havesting of  GREATER SAPHENOUS VEIN from RIGHT THIGH and partial LOWER LEG ;  Surgeon: Dorise MARLA Fellers, MD;  Location: [facility] OR;  Service: Open Heart Surgery;  Laterality: N/A;   EYE SURGERY     b/l cataract and cornea replaced    HAND SURGERY     left hand 1st/2nd trigger  fingers Dr. Cleotilde ortho    JOINT REPLACEMENT     KNEE ARTHROSCOPY Left remote   MOHS SURGERY     left cheek scc 2022 Dr. Lloyd   MOHS SURGERY     x 5 facial scc   right biceps tendon     repair/re attachment    SKIN CANCER EXCISION  10/2015   BCC - L ala (pending MOHs) and L scapula (complete excision)   TEE WITHOUT CARDIOVERSION N/A 07/29/2015   Procedure: TRANSESOPHAGEAL ECHOCARDIOGRAM (TEE);  Surgeon: Dorise MARLA Fellers, MD;  Location: [facility];  Service: Open Heart Surgery;  Laterality: N/A;   TONSILLECTOMY  1949   TOTAL KNEE ARTHROPLASTY Left 03/18/2016   cemented L TKR; Kayla Cleotilde, MD   Patient  Active Problem List   Diagnosis Date Noted   Back pain 08/09/2024   Stroke-like symptoms 08/01/2024   Status post placement of implantable loop recorder 07/12/2024   Seizure disorder (HCC) 06/28/2024   Annual physical exam 07/10/2023   TIA (transient ischemic attack) 05/02/2023   GERD without esophagitis 05/02/2023   Carotid stenosis, symptomatic, with infarction (HCC) 04/26/2023   History of CVA (cerebrovascular accident) without residual deficits 03/04/2023   Hemiparesis affecting right side as late effect of cerebrovascular accident (CVA) (HCC) 02/22/2023   Chronic radicular pain of lower back 08/10/2022   Cervical spondylosis 03/11/2022   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   Bilateral carotid artery stenosis 03/11/2022   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   Lumbar spondylosis 10/07/2021   DDD (degenerative disc disease), lumbar 10/07/2021   History of radiation therapy 08/26/2021   Aortic atherosclerosis 04/17/2021   Diverticulosis 04/17/2021   Hypertension associated with diabetes (HCC) 10/07/2020   Overweight (BMI 25.0-29.9) 10/07/2020   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   Vitamin D  deficiency 08/01/2020   Polyp of sigmoid colon 08/01/2020   Insomnia 06/07/2020   Degenerative joint disease of hand 03/01/2020   BPH (benign prostatic hyperplasia) 08/05/2018   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   Macular scar of both eyes 04/20/2018   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   Scotoma involving central area of both eyes 04/20/2018   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   Fatty liver 03/25/2018   Erectile dysfunction 02/23/2017   Hx of CABG 01/24/2017   Advanced care planning/counseling discussion 10/21/2015   Coronary artery disease of native artery of native heart with stable angina pectoris    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   Dyslipidemia 01/02/2015   Osteoarthritis 01/02/2015   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/02/2014    ONSET DATE: 08/01/24  REFERRING DIAG: Z86.73 (ICD-10-CM) - History of CVA (cerebrovascular accident)   THERAPY DIAG:  Muscle weakness (generalized)  Difficulty in walking, not elsewhere classified  Abnormality of gait and mobility  Other abnormalities of gait and mobility  Rationale for Evaluation and Treatment: Rehabilitation  SUBJECTIVE:                                                                                                                                                                                             SUBJECTIVE STATEMENT: No medical updates. Continued to work on [employer]. No falls over the weekend. Pt has insidious soreness in bilat legs.   PERTINENT HISTORY:  CVA and hospital admission on 08/01/24. Pt referred after CVA. Pt was unable to get inpatient rehab but was able to get  with home health. Pt had to go through a marathon with nurses and PT for home health. Pt did end up in PT with a HHPT he appreciated and decided he should come to outpatient services. Pt feels he is improving but is not at his pre morbid level. Pt using cane at the moment and uses transport chair to get to clinic.PMX of with history of left carotid artery stent, CAD status post 5 vessel CABG, dyslipidemia, hypertension, history of stereotypic spells.    PAIN:  Are you having pain? No  PRECAUTIONS: Fall  WEIGHT BEARING RESTRICTIONS: No  FALLS: Has patient fallen in last 6 months? No  LIVING ENVIRONMENT: Lives with: lives with their spouse Lives in: House/apartment- Duplex Stairs: No Has following equipment at home: Single point cane and Walker - 2 wheeled  PLOF: Independent and Independent with basic ADLs  PATIENT GOALS: improve gait, improve R LE strength, improve balance and mobility.   OBJECTIVE:  Note: Objective measures were completed at Evaluation unless otherwise noted.  DIAGNOSTIC FINDINGS: 1. Scattered patchy small volume acute ischemic nonhemorrhagic left MCA distribution infarcts as above. No associated mass effect. 2. Underlying chronic left frontoparietal infarct, with additional small remote lacunar infarcts about the left greater than right basal ganglia. 3. Underlying age-related cerebral atrophy with mild chronic small vessel ischemic disease.  COGNITION: Overall cognitive status: Within functional limits for tasks assessed   SENSATION: WFL  LOWER EXTREMITY ROM:    WNL for tasks assessed   LOWER EXTREMITY MMT:    MMT Right Eval Left Eval  Hip flexion 4 4+  Hip extension    Hip abduction 4 4+  Hip adduction 4+ 4+  Hip internal rotation    Hip external rotation    Knee flexion 4 4+  Knee extension 4 4+  Ankle dorsiflexion 4 4+  Ankle plantarflexion    Ankle inversion    Ankle eversion    (Blank rows = not tested)  FUNCTIONAL TESTS:  5 times sit to stand: 21.23 sec Timed up and go (TUG): test visit 2  6 minute walk test: 554 ft with CGA  BERG 44 : .58 m/s  PATIENT SURVEYS:  SIS 16: 65.6 %                                                                                                                             TREATMENT DATE: 09/18/24   -STS from airex pad, hands free x10 (slow, with emphasis on full rise, maintained balance)  -AMB overground c 5lb AW: 347ft, no device.  70m44s; post AMB vitals 96%82bpm  -STS from airex pad, hands free x10 (slow, with emphasis on full rise, maintained balance)  -AMB overground c 5lb AW: 37ft, no device. 64m29s; -STS from airex pad, hands free x10 -AMB overground c 5lb AW: 389ft, no device. 8m16s; -lateral step overs hurdle, airex, hurdle (bilat), hands free (several LOB during, min-modA provided for recovery)   -AMB overground c 5lb AW:  332ft, no device. 26m07s; 0.40m/s  -lateral step overs hurdle, airex, hurdle (bilat), hands free (several LOB during, min-modA provided for recovery) no ankle weights this time *all activities today show a deficiency in ankle PF power production and would benefit from exploring this more in the future.  PATIENT EDUCATION: Education details: Pt educated throughout session about proper posture and technique with exercises. Improved exercise technique, movement at target joints, use of target muscles after min to mod verbal, visual, tactile cues. Person educated: Patient Education method: Explanation Education comprehension: verbalized understanding  HOME EXERCISE PROGRAM: Access Code: [email or web address] URL: [email or web address] Date: 08/28/2024 Prepared by: Lonni Gainer  Exercises - Sit to Stand Without Arm Support  - 1 x daily - 7 x weekly - 3 sets - 10 reps - Mini Lunge with Counter Support   - 1 x daily - 7 x weekly - 2 sets - 10 reps - Side Stepping with Resistance at Ankles and Counter Support  - 1 x daily - 7 x weekly - 2 sets - 10 reps - Marching Near Counter  - 1 x daily - 7 x weekly - 2 sets - 10 reps  SHORT TERM GOALS: Target date: 09/19/2024 Patient will be independent in home exercise program to improve strength/mobility for better functional independence with ADLs. Baseline: No HEP currently  Goal status: INITIAL  LONG TERM GOALS: Target date: 11/14/2024 1.  Patient will complete five times sit to stand test in < 15 seconds indicating an increased LE strength and  improved balance. Baseline: 21.23 sec  Goal status: INITIAL  2.  Patient will improve SIS 16 score to 75 to demonstrate statistically significant improvement in mobility and quality of life as it relates to their CVA functional deficits.  Baseline: 65.6% Goal status: INITIAL   3.  Patient will increase Berg Balance score by > 6 points to demonstrate decreased fall risk during functional activities. Baseline: 44 Goal status: INITIAL   4.  Patient will reduce timed up and go to <11 seconds to reduce fall risk and demonstrate improved transfer/gait ability. Baseline: 13.11 sec  Goal status: INITIAL  5.  Patient will increase 10 meter walk test to >1.78m/s as to improve gait speed for better community ambulation and to reduce fall risk. Baseline: .58 m/s Goal status: INITIAL  6.  Patient will increase six minute walk test distance to >1000 for progression to community ambulator and improve gait ability Baseline: 554 ft with quad cane and CGA Goal status: INITIAL  ASSESSMENT:  CLINICAL IMPRESSION: Continued with interval gait training. Also aimed to maintain overall volume of transfers and balance training. Pt reports improved tolerance overall through dispersed intervention. Pt could benefit from producing more ankle PF force production for both increasing stride length in gait, but also for arresting anteiror LOB from an ankle strategy. Patient will benefit from skilled physical therapy intervention to reduce deficits and impairments identified in evaluation, in order to reduce pain, improve quality of life, and maximize activity tolerance for ADL, IADL, and leisure/fitness. Physical therapy will help pt achieve long and short term goals of care.    OBJECTIVE IMPAIRMENTS: Abnormal gait, decreased activity tolerance, decreased balance, decreased endurance, decreased mobility, difficulty walking, decreased strength, and hypomobility.   ACTIVITY LIMITATIONS: standing, squatting, stairs,  transfers, and locomotion level  PARTICIPATION LIMITATIONS: shopping, community activity, and yard work  PERSONAL FACTORS: Age and 3+ comorbidities:  with history of left carotid artery stent, CAD status post 5 vessel CABG, dyslipidemia, hypertension, history of stereotypic spells are  also affecting patient's functional outcome.   REHAB POTENTIAL: Good  CLINICAL DECISION MAKING: Evolving/moderate complexity  EVALUATION COMPLEXITY: Moderate  PLAN:  PT FREQUENCY: 2x/week  PT DURATION: 12 weeks  PLANNED INTERVENTIONS: 97750- Physical Performance Testing, 97110-Therapeutic exercises, 97530- Therapeutic activity, [id number]- Neuromuscular re-education, 97535- Self Care, [id number]- Manual therapy, [phone]- Gait training, Patient/Family education, Balance training, and Stair training  PLAN FOR NEXT SESSION:  Screen ankle strength, continue with gait, balance, and transfers trianing.   3:37 PM, 09/18/24 Peggye JAYSON Linear, PT, DPT Physical Therapist - [facility]  Outpatient Physical Therapy- Main Campus [phone]

## 2024-09-20 ENCOUNTER — Ambulatory Visit

## 2024-09-20 DIAGNOSIS — R262 Difficulty in walking, not elsewhere classified: Secondary | ICD-10-CM

## 2024-09-20 DIAGNOSIS — R269 Unspecified abnormalities of gait and mobility: Secondary | ICD-10-CM

## 2024-09-20 DIAGNOSIS — M6281 Muscle weakness (generalized): Secondary | ICD-10-CM | POA: Diagnosis not present

## 2024-09-20 DIAGNOSIS — R2689 Other abnormalities of gait and mobility: Secondary | ICD-10-CM

## 2024-09-20 NOTE — Therapy (Signed)
 OUTPATIENT PHYSICAL THERAPY TREATMENT  Patient Name: Shannon Chung MRN: [id number] DOB:1940-12-19, 83 y.o., male Today's Date: 09/20/2024  PCP: Gretel App, NP  REFERRING PROVIDER: Gretel App, NP  END OF SESSION:  PT End of Session - 09/20/24 1536     Visit Number 9    Number of Visits 24    Date for Recertification  11/15/23    Authorization Type [employer] Medicare    Authorization Time Period 10/21-1/13/26 for 24 visits    Progress Note Due on Visit 20    PT Start Time 1536    PT Stop Time 1621    PT Time Calculation (min) 45 min    Equipment Utilized During Treatment Gait belt    Activity Tolerance Patient tolerated treatment well;No increased pain    Behavior During Therapy [facility] for tasks assessed/performed            Past Medical History:  Diagnosis Date   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 06/2015 Cardiac CT: Ca score 1103 (84th %'ile);  b. 07/2015 Cath: LM 70, LAD 80p, 100/72m, D1 70, D2 95, RI 75, RCA 100p/m;  c. 07/2015 CABG x 5 (LIMA->LAD, VG->Diag, VG->OM1->OM2, VG->OM3).   COVID-19    12/2021   COVID-19 01/18/2022   COVID-19 vaccine administered 01/18/2022   Unknown how many vaccine doses have been received. Entered from Emergency Triage Note.   Diabetes mellitus without complication (HCC) 07/2015   Dyslipidemia    Essential hypertension    Essential hypertension 01/02/2015   Formatting of this note might be different from the original.  Last Assessment & Plan:   Chronic, stable. Continue current regimen.   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 dilated, EF 35% improved to 45-50% (2015);  b. 07/2015 EF 25-35% by LV gram.   Ischemic cardiomyopathy 01/02/2015   Kidney stones 04/17/2021   Lone atrial fibrillation (HCC) 1983   a. isolated episode, not on OAC.   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 06/2014: LV; resolved with coumadin-->no residual on f/u echo, no longer on coumadin.   Mural  thrombus of heart 08/26/2021   Formatting of this note might be different from the original. Jun 16, 2019 Entered By: VINIE ALLEAN AQUAS Comment: left ventricle, cardiac apex   Osteoarthritis    a. 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 Jun 16, 2019 Entered By: VINIE ALLEAN AQUAS Comment: leftAug 19, 2020 Entered By: VINIE ALLEAN AQUAS Comment: resolved by total left knee replacement Jun 16, 2019 Entered By: VINIE ALLEAN AQUAS Comment: leftAug 19, 2020 Entered By: VINIE ALLEAN AQUAS Comment: resolved by total left knee replacement   Trigger finger of left hand 07/07/2019   Vitamin D  deficiency    Past Surgical History:  Procedure Laterality Date   BICEPS TENDON REPAIR Right 1993   CARDIAC CATHETERIZATION N/A 07/05/2015   Procedure: Left Heart Cath and Coronary Angiography;  Surgeon: Evalene JINNY Lunger, MD;  Location: [facility] INVASIVE CV LAB;  Service: Cardiovascular;  Laterality: N/A;   CAROTID PTA/STENT INTERVENTION Left 04/26/2023   Procedure: CAROTID PTA/STENT INTERVENTION;  Surgeon: Marea Selinda RAMAN, MD;  Location: [facility] INVASIVE CV LAB;  Service: Cardiovascular;  Laterality: Left;   CATARACT EXTRACTION Left 12/2016   with keratoplasty   COLONOSCOPY  2007   COLONOSCOPY WITH PROPOFOL  N/A 12/02/2017   TA, SSA, rpt 3 yrs(Tahiliani, Varnita B, MD)   COLONOSCOPY WITH PROPOFOL  N/A 11/26/2020   Procedure: COLONOSCOPY WITH PROPOFOL ;  Surgeon: Jinny Carmine, MD;  Location: [facility] ENDOSCOPY;  Service: Endoscopy;  Laterality: N/A;   CORONARY ARTERY BYPASS GRAFT N/A 07/29/2015   Procedure: CORONARY ARTERY BYPASS GRAFTING (CABG) x 5 (LIMA to LAD, SVG to DIAGONAL,  SVG SEQUENTIALLY to OM1 and OM2, SVG to OM3) with Endoscopic Vein Havesting of  GREATER SAPHENOUS VEIN from RIGHT THIGH and partial LOWER LEG ;  Surgeon: Dorise MARLA Fellers, MD;  Location: [facility] OR;  Service: Open Heart Surgery;  Laterality: N/A;   EYE SURGERY     b/l cataract and cornea replaced    HAND SURGERY     left hand 1st/2nd trigger  fingers Dr. Cleotilde ortho    JOINT REPLACEMENT     KNEE ARTHROSCOPY Left remote   MOHS SURGERY     left cheek scc 2022 Dr. Lloyd   MOHS SURGERY     x 5 facial scc   right biceps tendon     repair/re attachment    SKIN CANCER EXCISION  10/2015   BCC - L ala (pending MOHs) and L scapula (complete excision)   TEE WITHOUT CARDIOVERSION N/A 07/29/2015   Procedure: TRANSESOPHAGEAL ECHOCARDIOGRAM (TEE);  Surgeon: Dorise MARLA Fellers, MD;  Location: [facility] OR;  Service: Open Heart Surgery;  Laterality: N/A;   TONSILLECTOMY  1949   TOTAL KNEE ARTHROPLASTY Left 03/18/2016   cemented L TKR; Kayla Cleotilde, MD  Patient Active Problem List   Diagnosis Date Noted   Back pain 08/09/2024   Stroke-like symptoms 08/01/2024   Status post placement of implantable loop recorder 07/12/2024   Seizure disorder (HCC) 06/28/2024   Annual physical exam 07/10/2023   TIA (transient ischemic attack) 05/02/2023   GERD without esophagitis 05/02/2023   Carotid stenosis, symptomatic, with infarction (HCC) 04/26/2023   History of CVA (cerebrovascular accident) without residual deficits 03/04/2023   Hemiparesis affecting right side as late effect of cerebrovascular accident (CVA) (HCC) 02/22/2023   Chronic radicular pain of lower back 08/10/2022   Cervical spondylosis 03/11/2022   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   Bilateral carotid artery stenosis 03/11/2022   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   Lumbar spondylosis 10/07/2021   DDD (degenerative disc disease), lumbar 10/07/2021   History of radiation therapy 08/26/2021   Aortic atherosclerosis 04/17/2021   Diverticulosis 04/17/2021   Hypertension associated with diabetes (HCC) 10/07/2020   Overweight (BMI 25.0-29.9) 10/07/2020   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   Vitamin D  deficiency 08/01/2020   Polyp of sigmoid colon 08/01/2020   Insomnia 06/07/2020   Degenerative joint disease of hand 03/01/2020   BPH (benign prostatic hyperplasia) 08/05/2018   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   Macular scar of both eyes 04/20/2018   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   Scotoma involving central area of both eyes 04/20/2018   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   Fatty liver 03/25/2018   Erectile dysfunction 02/23/2017   Hx of CABG 01/24/2017   Advanced care planning/counseling discussion 10/21/2015   Coronary artery disease of native artery of native heart with stable angina pectoris    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   Dyslipidemia 01/02/2015   Osteoarthritis 01/02/2015   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/02/2014    ONSET DATE: 08/01/24  REFERRING DIAG: Z86.73 (ICD-10-CM) - History of CVA (cerebrovascular accident)   THERAPY DIAG:  Muscle weakness (generalized)  Difficulty in walking, not elsewhere classified  Abnormality of gait and mobility  Other abnormalities of gait and mobility  Rationale for Evaluation and Treatment: Rehabilitation  SUBJECTIVE:                                                                                                                                                                                             SUBJECTIVE STATEMENT: Patient reports doing well today with no pain upon arrival. He reports upon arrival that he prefers to warm up on the bike today.   PERTINENT HISTORY:  CVA and hospital admission on 08/01/24. Pt referred after CVA. Pt was unable to get inpatient rehab  but was able to get with home health. Pt had to go through a marathon with nurses and PT for home health. Pt did end up in PT with a HHPT he appreciated and decided he should come to outpatient services. Pt feels he is improving but is not at his pre morbid level. Pt using cane at the moment and uses transport chair to get to clinic.PMX of with history of left carotid artery stent, CAD status post 5 vessel CABG, dyslipidemia, hypertension, history of  stereotypic spells.   PAIN:  Are you having pain? No  PRECAUTIONS: Fall  WEIGHT BEARING RESTRICTIONS: No  FALLS: Has patient fallen in last 6 months? No  LIVING ENVIRONMENT: Lives with: lives with their spouse Lives in: House/apartment- Duplex Stairs: No Has following equipment at home: Single point cane and Walker - 2 wheeled  PLOF: Independent and Independent with basic ADLs  PATIENT GOALS: improve gait, improve R LE strength, improve balance and mobility.   OBJECTIVE:  Note: Objective measures were completed at Evaluation unless otherwise noted.  DIAGNOSTIC FINDINGS: 1. Scattered patchy small volume acute ischemic nonhemorrhagic left MCA distribution infarcts as above. No associated mass effect. 2. Underlying chronic left frontoparietal infarct, with additional small remote lacunar infarcts about the left greater than right basal ganglia. 3. Underlying age-related cerebral atrophy with mild chronic small vessel ischemic disease.  COGNITION: Overall cognitive status: Within functional limits for tasks assessed   SENSATION: WFL  LOWER EXTREMITY ROM:    WNL for tasks assessed   LOWER EXTREMITY MMT:    MMT Right Eval Left Eval  Hip flexion 4 4+  Hip extension    Hip abduction 4 4+  Hip adduction 4+ 4+  Hip internal rotation    Hip external rotation    Knee flexion 4 4+  Knee extension 4 4+  Ankle dorsiflexion 4 4+  Ankle plantarflexion    Ankle inversion    Ankle eversion    (Blank rows = not tested)  FUNCTIONAL TESTS:  5 times sit to stand: 21.23 sec Timed up and go (TUG): test visit 2  6 minute walk test: 554 ft with CGA  BERG 44 : .58 m/s  PATIENT SURVEYS:  SIS 16: 65.6 %                                                                                                                             TREATMENT DATE: 09/20/24   -Recumbent Bike LVL 2-5 6 min. Upper and Lower Extremity.  NMR:  Standing head turns horizontal/vertical 5x2 with EO.  Horizontal head turns with EC 1x5. Standing feet together in // bars. 1X30'' with EO & EC 1x30''  Standing 1/2 tandem in // bars with EO and EC 1x30'' each and with head turns/nods x5 each. High knee Marching walk in // bars (focus on slow LE movements for improved single leg balance). X2 laps    PATIENT EDUCATION: Education details: Pt educated throughout session about proper  posture and technique with exercises. Improved exercise technique, movement at target joints, use of target muscles after min to mod verbal, visual, tactile cues. Person educated: Patient Education method: Explanation Education comprehension: verbalized understanding  HOME EXERCISE PROGRAM: Access Code: [email or web address] URL: [email or web address] Date: 08/28/2024 Prepared by: Lonni Gainer  Exercises - Sit to Stand Without Arm Support  - 1 x daily - 7 x weekly - 3 sets - 10 reps - Mini Lunge with Counter Support   - 1 x daily - 7 x weekly - 2 sets - 10 reps - Side Stepping with Resistance at Ankles and Counter Support  - 1 x daily - 7 x weekly - 2 sets - 10 reps - Marching Near Counter  - 1 x daily - 7 x weekly - 2 sets - 10 reps  SHORT TERM GOALS: Target date: 09/19/2024 Patient will be independent in home exercise program to improve strength/mobility for better functional independence with ADLs. Baseline: No HEP currently  Goal status: INITIAL  LONG TERM GOALS: Target date: 11/14/2024 1.  Patient will complete five times sit to stand test in < 15 seconds indicating an increased LE strength and improved balance. Baseline: 21.23 sec  Goal status: INITIAL  2.  Patient will improve SIS 16 score to 75 to demonstrate statistically significant improvement in mobility and quality of life as it relates to their CVA functional deficits.  Baseline: 65.6% Goal status: INITIAL   3.  Patient will increase Berg Balance score by > 6 points to demonstrate decreased fall risk during functional  activities. Baseline: 44 Goal status: INITIAL   4.  Patient will reduce timed up and go to <11 seconds to reduce fall risk and demonstrate improved transfer/gait ability. Baseline: 13.11 sec  Goal status: INITIAL  5.  Patient will increase 10 meter walk test to >1.78m/s as to improve gait speed for better community ambulation and to reduce fall risk. Baseline: .58 m/s Goal status: INITIAL  6.  Patient will increase six minute walk test distance to >1000 for progression to community ambulator and improve gait ability Baseline: 554 ft with quad cane and CGA Goal status: INITIAL  ASSESSMENT:  CLINICAL IMPRESSION: Patient warmed up on recumbent bike today. When asked what he thought his most limiting deficits are, pt reported that he is most concerned with balance as he noted that in his previous treatments he was not confident in his balance. Patient performed various new exercises in // bars today to improve both static and dynamic balance. Combinations of normal stance, romberg, and 1/2 tandem stance along with EO/EC. Pt will continue to benefit from skilled physical therapy intervention to address impairments, improve QOL, and attain therapy goals   *Gait belt donned throughout today's treatment session for increased safety.    OBJECTIVE IMPAIRMENTS: Abnormal gait, decreased activity tolerance, decreased balance, decreased endurance, decreased mobility, difficulty walking, decreased strength, and hypomobility.   ACTIVITY LIMITATIONS: standing, squatting, stairs, transfers, and locomotion level  PARTICIPATION LIMITATIONS: shopping, community activity, and yard work  PERSONAL FACTORS: Age and 3+ comorbidities:  with history of left carotid artery stent, CAD status post 5 vessel CABG, dyslipidemia, hypertension, history of stereotypic spells are also affecting patient's functional outcome.   REHAB POTENTIAL: Good  CLINICAL DECISION MAKING: Evolving/moderate complexity  EVALUATION  COMPLEXITY: Moderate  PLAN:  PT FREQUENCY: 2x/week  PT DURATION: 12 weeks  PLANNED INTERVENTIONS: 97750- Physical Performance Testing, 97110-Therapeutic exercises, 97530- Therapeutic activity, [id number]- Neuromuscular re-education, 97535- Self Care, [id number]- Manual therapy, [id number]- Gait training, Patient/Family education,  Balance training, and Stair training  PLAN FOR NEXT SESSION:  Screen ankle strength, continue with gait, balance, and transfers trianing.  Add HEP and issued updated handout as appropriate.  4:46 PM, 09/20/24 Chyrl London, PT Physical Therapist - [facility]  Outpatient Physical Therapy- Main Campus [phone]

## 2024-09-21 ENCOUNTER — Other Ambulatory Visit

## 2024-09-21 DIAGNOSIS — C61 Malignant neoplasm of prostate: Secondary | ICD-10-CM

## 2024-09-22 ENCOUNTER — Ambulatory Visit: Payer: Self-pay | Admitting: Urology

## 2024-09-22 LAB — PSA: Prostate Specific Ag, Serum: 9.3 ng/mL — ABNORMAL HIGH (ref 0.0–4.0)

## 2024-09-22 NOTE — Progress Notes (Signed)
 Called patient and patient didn't answer left message to call and get scheduled with Dr.Garren

## 2024-09-25 ENCOUNTER — Ambulatory Visit

## 2024-09-25 ENCOUNTER — Encounter

## 2024-09-25 DIAGNOSIS — R262 Difficulty in walking, not elsewhere classified: Secondary | ICD-10-CM

## 2024-09-25 DIAGNOSIS — M6281 Muscle weakness (generalized): Secondary | ICD-10-CM

## 2024-09-25 DIAGNOSIS — R269 Unspecified abnormalities of gait and mobility: Secondary | ICD-10-CM

## 2024-09-25 DIAGNOSIS — M25552 Pain in left hip: Secondary | ICD-10-CM

## 2024-09-25 DIAGNOSIS — I639 Cerebral infarction, unspecified: Secondary | ICD-10-CM

## 2024-09-25 DIAGNOSIS — M545 Low back pain, unspecified: Secondary | ICD-10-CM

## 2024-09-25 DIAGNOSIS — R2689 Other abnormalities of gait and mobility: Secondary | ICD-10-CM

## 2024-09-25 NOTE — Therapy (Signed)
 OUTPATIENT PHYSICAL THERAPY TREATMENT/Physical Therapy Progress Note   Dates of reporting period  08/22/2024   to   09/25/2024   Patient Name: Shannon Chung MRN: [id number] DOB:1941-02-05, 83 y.o., male Today's Date: 09/25/2024  PCP: Gretel App, NP  REFERRING PROVIDER: Gretel App, NP  END OF SESSION:  PT End of Session - 09/25/24 1351     Visit Number 10    Number of Visits 24    Date for Recertification  11/15/23    Authorization Type [employer] Medicare    Authorization Time Period 10/21-1/13/26 for 24 visits    Progress Note Due on Visit 20    PT Start Time 1403    PT Stop Time 1447    PT Time Calculation (min) 44 min    Equipment Utilized During Treatment Gait belt    Activity Tolerance Patient tolerated treatment well;No increased pain    Behavior During Therapy [facility] for tasks assessed/performed             Past Medical History:  Diagnosis Date   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 06/2015 Cardiac CT: Ca score 1103 (84th %'ile);  b. 07/2015 Cath: LM 70, LAD 80p, 100/24m, D1 70, D2 95, RI 75, RCA 100p/m;  c. 07/2015 CABG x 5 (LIMA->LAD, VG->Diag, VG->OM1->OM2, VG->OM3).   COVID-19    12/2021   COVID-19 01/18/2022   COVID-19 vaccine administered 01/18/2022   Unknown how many vaccine doses have been received. Entered from Emergency Triage Note.   Diabetes mellitus without complication (HCC) 07/2015   Dyslipidemia    Essential hypertension    Essential hypertension 01/02/2015   Formatting of this note might be different from the original.  Last  Assessment & Plan:   Chronic, stable. Continue current regimen.   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 dilated, EF 35% improved to 45-50% (2015);  b. 07/2015 EF 25-35% by LV gram.   Ischemic cardiomyopathy 01/02/2015   Kidney stones 04/17/2021   Lone atrial fibrillation (HCC) 1983   a. isolated episode, not on OAC.   Malignant neoplasm of prostate [facility]) 10/07/2021   09/2021    Medicare annual wellness visit, subsequent 10/21/2015   Mural thrombus of cardiac apex  a. 06/2014: LV; resolved with coumadin-->no residual on f/u echo, no longer on coumadin.   Mural thrombus of heart 08/26/2021   Formatting of this note might be different from the original. Jun 16, 2019 Entered By: VINIE ALLEAN AQUAS Comment: left ventricle, cardiac apex   Osteoarthritis    a. 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 Jun 16, 2019 Entered By: VINIE ALLEAN AQUAS Comment: leftAug 19, 2020 Entered By: VINIE ALLEAN AQUAS Comment: resolved by total left knee replacement Jun 16, 2019 Entered By: VINIE ALLEAN AQUAS Comment: leftAug 19, 2020 Entered By: VINIE ALLEAN AQUAS Comment: resolved by total left knee replacement   Trigger finger of left hand 07/07/2019   Vitamin D  deficiency    Past Surgical History:  Procedure Laterality Date   BICEPS TENDON REPAIR Right 1993   CARDIAC CATHETERIZATION N/A 07/05/2015   Procedure: Left Heart Cath and Coronary Angiography;  Surgeon: Evalene JINNY Lunger, MD;  Location: [facility] INVASIVE CV LAB;  Service: Cardiovascular;  Laterality: N/A;   CAROTID PTA/STENT INTERVENTION Left 04/26/2023   Procedure: CAROTID PTA/STENT INTERVENTION;  Surgeon: Marea Selinda RAMAN, MD;  Location: [facility] INVASIVE CV LAB;  Service: Cardiovascular;  Laterality: Left;   CATARACT EXTRACTION Left 12/2016   with keratoplasty   COLONOSCOPY  2007   COLONOSCOPY WITH PROPOFOL  N/A 12/02/2017   TA, SSA, rpt 3 yrs(Tahiliani, Varnita B, MD)   COLONOSCOPY WITH PROPOFOL  N/A 11/26/2020   Procedure: COLONOSCOPY WITH PROPOFOL ;  Surgeon: Jinny Carmine, MD;  Location: [facility] ENDOSCOPY;  Service: Endoscopy;  Laterality: N/A;   CORONARY ARTERY BYPASS GRAFT N/A 07/29/2015   Procedure: CORONARY ARTERY BYPASS GRAFTING (CABG) x 5 (LIMA to LAD, SVG to DIAGONAL,  SVG SEQUENTIALLY to OM1 and OM2, SVG to OM3) with Endoscopic Vein Havesting of  GREATER SAPHENOUS VEIN from RIGHT THIGH and partial LOWER LEG ;  Surgeon: Dorise MARLA Fellers, MD;  Location: [facility] OR;  Service: Open Heart Surgery;  Laterality:  N/A;   EYE SURGERY     b/l cataract and cornea replaced    HAND SURGERY     left hand 1st/2nd trigger fingers Dr. Cleotilde ortho    JOINT REPLACEMENT     KNEE ARTHROSCOPY Left remote   MOHS SURGERY     left cheek scc 2022 Dr. Lloyd   MOHS SURGERY     x 5 facial scc   right biceps tendon     repair/re attachment    SKIN CANCER EXCISION  10/2015   BCC - L ala (pending MOHs) and L scapula (complete excision)   TEE WITHOUT CARDIOVERSION N/A 07/29/2015   Procedure: TRANSESOPHAGEAL ECHOCARDIOGRAM (TEE);  Surgeon: Dorise MARLA Fellers, MD;  Location: [facility] OR;  Service: Open Heart Surgery;  Laterality: N/A;   TONSILLECTOMY  1949  TOTAL KNEE ARTHROPLASTY Left 03/18/2016   cemented L TKR; Kayla Pinal, MD   Patient Active Problem List   Diagnosis Date Noted   Back pain 08/09/2024   Stroke-like symptoms 08/01/2024   Status post placement of implantable loop recorder 07/12/2024   Seizure disorder (HCC) 06/28/2024   Annual physical exam 07/10/2023   TIA (transient ischemic attack) 05/02/2023   GERD without esophagitis 05/02/2023   Carotid stenosis, symptomatic, with infarction (HCC) 04/26/2023   History of CVA (cerebrovascular accident) without residual deficits 03/04/2023   Hemiparesis affecting right side as late effect of cerebrovascular accident (CVA) (HCC) 02/22/2023   Chronic radicular pain of lower back 08/10/2022   Cervical spondylosis 03/11/2022   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   Bilateral carotid artery stenosis 03/11/2022   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   Lumbar spondylosis 10/07/2021   DDD (degenerative disc disease), lumbar 10/07/2021   History of radiation therapy 08/26/2021   Aortic atherosclerosis 04/17/2021   Diverticulosis 04/17/2021   Hypertension associated with diabetes (HCC) 10/07/2020   Overweight (BMI 25.0-29.9) 10/07/2020   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   Vitamin D  deficiency 08/01/2020   Polyp of sigmoid colon 08/01/2020   Insomnia 06/07/2020    Degenerative joint disease of hand 03/01/2020   BPH (benign prostatic hyperplasia) 08/05/2018   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   Macular scar of both eyes 04/20/2018   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   Scotoma involving central area of both eyes 04/20/2018   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   Fatty liver 03/25/2018   Erectile dysfunction 02/23/2017   Hx of CABG 01/24/2017   Advanced care planning/counseling discussion 10/21/2015   Coronary artery disease of native artery of native heart with stable angina pectoris    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   Dyslipidemia 01/02/2015   Osteoarthritis 01/02/2015   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/02/2014    ONSET DATE: 08/01/24  REFERRING DIAG: Z86.73 (ICD-10-CM) - History of CVA (cerebrovascular accident)   THERAPY DIAG:  Muscle weakness (generalized)  Difficulty in walking, not elsewhere classified  Abnormality of gait and mobility  Other abnormalities of gait and mobility  Pain in left hip  Bilateral low back pain without sciatica, unspecified chronicity  Rationale for Evaluation and Treatment: Rehabilitation  SUBJECTIVE:                                                                                                                                                                                             SUBJECTIVE STATEMENT: Patient reports doing okay today with no significant changes since previous treatment. He states that he continues  to perform HEP at home consistently though he does not do too good with the balance exercises.   PERTINENT HISTORY:  CVA and hospital admission on 08/01/24. Pt referred after CVA. Pt was unable to get inpatient rehab but was able to get with home health. Pt had to go through a marathon with nurses and PT for home health. Pt did end up in PT with a HHPT he appreciated and decided he should come to  outpatient services. Pt feels he is improving but is not at his pre morbid level. Pt using cane at the moment and uses transport chair to get to clinic.PMX of with history of left carotid artery stent, CAD status post 5 vessel CABG, dyslipidemia, hypertension, history of stereotypic spells.   PAIN:  Are you having pain? No  PRECAUTIONS: Fall  WEIGHT BEARING RESTRICTIONS: No  FALLS: Has patient fallen in last 6 months? No  LIVING ENVIRONMENT: Lives with: lives with their spouse Lives in: House/apartment- Duplex Stairs: No Has following equipment at home: Single point cane and Walker - 2 wheeled  PLOF: Independent and Independent with basic ADLs  PATIENT GOALS: improve gait, improve R LE strength, improve balance and mobility.   OBJECTIVE:  Note: Objective measures were completed at Evaluation unless otherwise noted.  DIAGNOSTIC FINDINGS: 1. Scattered patchy small volume acute ischemic nonhemorrhagic left MCA distribution infarcts as above. No associated mass effect. 2. Underlying chronic left frontoparietal infarct, with additional small remote lacunar infarcts about the left greater than right basal ganglia. 3. Underlying age-related cerebral atrophy with mild chronic small vessel ischemic disease.  COGNITION: Overall cognitive status: Within functional limits for tasks assessed   SENSATION: WFL  LOWER EXTREMITY ROM:    WNL for tasks assessed   LOWER EXTREMITY MMT:    MMT Right Eval Left Eval  Hip flexion 4 4+  Hip extension    Hip abduction 4 4+  Hip adduction 4+ 4+  Hip internal rotation    Hip external rotation    Knee flexion 4 4+  Knee extension 4 4+  Ankle dorsiflexion 4 4+  Ankle plantarflexion    Ankle inversion    Ankle eversion    (Blank rows = not tested)  FUNCTIONAL TESTS:  5 times sit to stand: 21.23 sec Timed up and go (TUG): test visit 2  6 minute walk test: 554 ft with CGA  BERG 44 : .58 m/s   PATIENT SURVEYS:  SIS 16: 65.6 %                                                                                                                              TREATMENT DATE: 09/25/24     -NUSTEP: [place] setting with resistance set 4-9 x10'.   Functional Testing:  : 77' without AD. SBA. BERG: 49/56 10 meter walk test: .90 m/s TUG Test: 13.77'' without AD.    PATIENT EDUCATION: Education details: Pt educated throughout session about proper posture and technique  with exercises. Improved exercise technique, movement at target joints, use of target muscles after min to mod verbal, visual, tactile cues. Person educated: Patient Education method: Explanation Education comprehension: verbalized understanding  HOME EXERCISE PROGRAM: Access Code: [email or web address] URL: [email or web address] Date: 08/28/2024 Prepared by: Lonni Gainer  Exercises - Sit to Stand Without Arm Support  - 1 x daily - 7 x weekly - 3 sets - 10 reps - Mini Lunge with Counter Support   - 1 x daily - 7 x weekly - 2 sets - 10 reps - Side Stepping with Resistance at Ankles and Counter Support  - 1 x daily - 7 x weekly - 2 sets - 10 reps - Marching Near Counter  - 1 x daily - 7 x weekly - 2 sets - 10 reps  SHORT TERM GOALS: Target date: 09/19/2024 Patient will be independent in home exercise program to improve strength/mobility for better functional independence with ADLs. Baseline: No HEP currently  Goal status: Met - Patient reports and demonstrates ability to perform current HEP independently at this time.   LONG TERM GOALS: Target date: 11/14/2024 1.  Patient will complete five times sit to stand test in < 15 seconds indicating an increased LE strength and improved balance. Baseline: 21.23 sec  Goal status: 09/25/24: Ongoing: 17.7''  2.  Patient will improve SIS 16 score to 75 to demonstrate statistically significant improvement in mobility and quality of life as it relates to their CVA functional deficits.  Baseline: 65.6% Goal  status: INITIAL   3.  Patient will increase Berg Balance score by > 6 points to demonstrate decreased fall risk during functional activities. Baseline: 44 Goal status: 09/25/2024: Ongoing - 49/56   4.  Patient will reduce timed up and go to <11 seconds to reduce fall risk and demonstrate improved transfer/gait ability. Baseline: 13.11 sec  Goal status: 09/25/2024: Ongoing - 13.77  5.  Patient will increase 10 meter walk test to >1.60m/s as to improve gait speed for better community ambulation and to reduce fall risk. Baseline: .58 m/s Goal status: 09/25/2024: Ongoing: .90 m/s  6.  Patient will increase six minute walk test distance to >1000 for progression to community ambulator and improve gait ability Baseline: 554 ft with quad cane and CGA Goal status: 09/25/2024: Ongoing- 780' without AD SBA.  ASSESSMENT:  CLINICAL IMPRESSION: Today was patient's 10th PT visit therefore a progress note was taken.  Subjectively,  Pt reports that he has more confidence in stepping out with improved stride length. Patient reports that he feels like his balance has improved some. He reports that he also has improved picking up the R foot with less foot drag when walking. When asked what is most difficult and what he wishes to work more on, pt responds balance. He reports continuing to have decreased balance, especially on uneven surfaces or noted when he walks with a more narrow stance.  Objectively, patient demonstrated signficant improvement toward most of his goals today. He demonstrated improved gait speed during with increased stride length. He demonstrated improved balance during BERG balance test increasing score by 5 points. He also showed improved balance/endurance during with increased distance and increased independence compared to evaluation. Patient did continue to show decreased balance when performing both tandem and SLS during the BERG balance test. He also continues to have  decreased stride length with increased stance/step width noted during today's treatment session.   Overall, patient has made good progress toward his goals with improved standing/dynamic balance, increased  gait speed, and improved gait mechanics. Though improvements have been made, he continues to have deficits listed with functional testing putting him at increased risk for falls. Patient will benefit from continuing PT treatments at this time. Patient's condition has the potential to improve in response to therapy. Maximum improvement is yet to be obtained. The anticipated improvement is attainable and reasonable in a generally predictable time.       OBJECTIVE IMPAIRMENTS: Abnormal gait, decreased activity tolerance, decreased balance, decreased endurance, decreased mobility, difficulty walking, decreased strength, and hypomobility.   ACTIVITY LIMITATIONS: standing, squatting, stairs, transfers, and locomotion level  PARTICIPATION LIMITATIONS: shopping, community activity, and yard work  PERSONAL FACTORS: Age and 3+ comorbidities:  with history of left carotid artery stent, CAD status post 5 vessel CABG, dyslipidemia, hypertension, history of stereotypic spells are also affecting patient's functional outcome.   REHAB POTENTIAL: Good  CLINICAL DECISION MAKING: Evolving/moderate complexity  EVALUATION COMPLEXITY: Moderate  PLAN:  PT FREQUENCY: 2x/week  PT DURATION: 12 weeks  PLANNED INTERVENTIONS: 97750- Physical Performance Testing, 97110-Therapeutic exercises, 97530- Therapeutic activity, [id number]- Neuromuscular re-education, 97535- Self Care, [id number]- Manual therapy, [phone]- Gait training, Patient/Family education, Balance training, and Stair training  PLAN FOR NEXT SESSION:  Continue working to improve dynamic balance allowing patient to ambulate with improved safety/confidence. Have patient complete SIS at next visit. Add HEP and issued updated handout as appropriate.  5:22 PM,  09/25/24 Norman Sharps, PT, DPT Physical Therapist - [facility]  Outpatient Physical Therapy- Main Campus [phone]

## 2024-09-26 LAB — CUP PACEART REMOTE DEVICE CHECK
Date Time Interrogation Session: 20251123235009
Implantable Pulse Generator Implant Date: 20240813

## 2024-09-26 NOTE — Assessment & Plan Note (Deleted)
 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

## 2024-09-26 NOTE — Progress Notes (Deleted)
   09/27/2024 7:39 AM   Shannon Chung 19-Feb-1941 [id number]  Reason for visit: Follow up prostate Ca   HPI: 83 y.o. male, initial follow up with me today, previously seen by Dr. Penne in May 2025  Prior HPI: Hx of prostate Ca  - initially followed by [facility], Dr. 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   Constitutional:  Alert and oriented, No acute distress. GU: ***  Laboratory Data: Component Ref Range & Units (hover) 5 d ago 6 mo ago 1 yr ago  Prostate Specific Ag, Serum 9.3 High  6.9 High  CM 6.9 High  CM    Pertinent Imaging: PSMA/PET (Nov 2024)   IMPRESSION: 1. Focal uptake in the LEFT lobe of the prostate gland consistent primary prostate adenocarcinoma. 2. Broad region of moderate radiotracer activity within the RIGHT acetabulum which corresponds to abnormal marrow signal on comparison MRI. 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 3. Mild uptake in the large L3 osteophyte is favored benign.    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         Penne JONELLE Skye, MD  [facility] [address], [address] [place], [place] [address] [phone]

## 2024-09-26 NOTE — Progress Notes (Signed)
 Remote Loop Recorder Transmission

## 2024-09-27 ENCOUNTER — Ambulatory Visit

## 2024-09-27 ENCOUNTER — Ambulatory Visit: Admitting: Urology

## 2024-09-27 DIAGNOSIS — M25552 Pain in left hip: Secondary | ICD-10-CM

## 2024-09-27 DIAGNOSIS — M6281 Muscle weakness (generalized): Secondary | ICD-10-CM

## 2024-09-27 DIAGNOSIS — M545 Low back pain, unspecified: Secondary | ICD-10-CM

## 2024-09-27 DIAGNOSIS — R262 Difficulty in walking, not elsewhere classified: Secondary | ICD-10-CM

## 2024-09-27 DIAGNOSIS — R2689 Other abnormalities of gait and mobility: Secondary | ICD-10-CM

## 2024-09-27 DIAGNOSIS — R269 Unspecified abnormalities of gait and mobility: Secondary | ICD-10-CM

## 2024-09-27 NOTE — Therapy (Signed)
 OUTPATIENT PHYSICAL THERAPY TREATMENT  Patient Name: Shannon Chung MRN: [id number] DOB:[birth year], 83 y.o., male Today's Date: 09/27/2024  PCP: Gretel App, NP  REFERRING PROVIDER: Gretel App, NP  END OF SESSION:  PT End of Session - 09/27/24 1335     Visit Number 11    Number of Visits 24    Date for Recertification  11/15/23    Authorization Type [employer] Medicare    Authorization Time Period 10/21-1/13/26 for 24 visits    Progress Note Due on Visit 20    PT Start Time 0200    PT Stop Time 0246    PT Time Calculation (min) 46 min    Equipment Utilized During Treatment Gait belt    Activity Tolerance Patient tolerated treatment well;No increased pain    Behavior During Therapy [facility] for tasks assessed/performed            Past Medical History:  Diagnosis Date   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 06/2015 Cardiac CT: Ca score 1103 (84th %'ile);  b. 07/2015 Cath: LM 70, LAD 80p, 100/22m, D1 70, D2 95, RI 75, RCA 100p/m;  c. 07/2015 CABG x 5 (LIMA->LAD, VG->Diag, VG->OM1->OM2, VG->OM3).   COVID-19    12/2021   COVID-19 01/18/2022   COVID-19 vaccine administered 01/18/2022   Unknown how many vaccine doses have been received. Entered from Emergency Triage Note.   Diabetes mellitus without complication (HCC) 07/2015   Dyslipidemia    Essential hypertension    Essential hypertension 01/02/2015   Formatting of this note might be different from the original.  Last Assessment & Plan:   Chronic, stable. Continue current regimen.   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 dilated, EF 35% improved to 45-50% (2015);  b. 07/2015 EF 25-35% by LV gram.   Ischemic cardiomyopathy 01/02/2015   Kidney stones 04/17/2021   Lone atrial fibrillation (HCC) 1983   a. isolated episode, not on OAC.   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 06/2014: LV; resolved with coumadin-->no residual on f/u echo, no longer on coumadin.   Mural  thrombus of heart 08/26/2021   Formatting of this note might be different from the original. Jun 16, 2019 Entered By: VINIE ALLEAN AQUAS Comment: left ventricle, cardiac apex   Osteoarthritis    a. 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 Jun 16, 2019 Entered By: VINIE ALLEAN AQUAS Comment: leftAug 19, 2020 Entered By: VINIE ALLEAN AQUAS Comment: resolved by total left knee replacement Jun 16, 2019 Entered By: VINIE ALLEAN AQUAS Comment: leftAug 19, 2020 Entered By: VINIE ALLEAN AQUAS Comment: resolved by total left knee replacement   Trigger finger of left hand 07/07/2019   Vitamin D  deficiency    Past Surgical History:  Procedure Laterality Date   BICEPS TENDON REPAIR Right 1993   CARDIAC CATHETERIZATION N/A 07/05/2015   Procedure: Left Heart Cath and Coronary Angiography;  Surgeon: Evalene JINNY Lunger, MD;  Location: [facility] INVASIVE CV LAB;  Service: Cardiovascular;  Laterality: N/A;   CAROTID PTA/STENT INTERVENTION Left 04/26/2023   Procedure: CAROTID PTA/STENT INTERVENTION;  Surgeon: Marea Selinda RAMAN, MD;  Location: [facility] INVASIVE CV LAB;  Service: Cardiovascular;  Laterality: Left;   CATARACT EXTRACTION Left 12/2016   with keratoplasty   COLONOSCOPY  2007   COLONOSCOPY WITH PROPOFOL  N/A 12/02/2017   TA, SSA, rpt 3 yrs(Tahiliani, Varnita B, MD)   COLONOSCOPY WITH PROPOFOL  N/A 11/26/2020   Procedure: COLONOSCOPY WITH PROPOFOL ;  Surgeon: Jinny Carmine, MD;  Location: [facility] ENDOSCOPY;  Service: Endoscopy;  Laterality: N/A;   CORONARY ARTERY BYPASS GRAFT N/A 07/29/2015   Procedure: CORONARY ARTERY BYPASS GRAFTING (CABG) x 5 (LIMA to LAD, SVG to DIAGONAL,  SVG SEQUENTIALLY to OM1 and OM2, SVG to OM3) with Endoscopic Vein Havesting of  GREATER SAPHENOUS VEIN from RIGHT THIGH and partial LOWER LEG ;  Surgeon: Dorise MARLA Fellers, MD;  Location: [facility] OR;  Service: Open Heart Surgery;  Laterality: N/A;   EYE SURGERY     b/l cataract and cornea replaced    HAND SURGERY     left hand 1st/2nd trigger  fingers Dr. Cleotilde ortho    JOINT REPLACEMENT     KNEE ARTHROSCOPY Left remote   MOHS SURGERY     left cheek scc 2022 Dr. Lloyd   MOHS SURGERY     x 5 facial scc   right biceps tendon     repair/re attachment    SKIN CANCER EXCISION  10/2015   BCC - L ala (pending MOHs) and L scapula (complete excision)   TEE WITHOUT CARDIOVERSION N/A 07/29/2015   Procedure: TRANSESOPHAGEAL ECHOCARDIOGRAM (TEE);  Surgeon: Dorise MARLA Fellers, MD;  Location: [facility] OR;  Service: Open Heart Surgery;  Laterality: N/A;   TONSILLECTOMY  1949   TOTAL KNEE ARTHROPLASTY Left 03/18/2016   cemented L TKR; Kayla Cleotilde, MD  Patient Active Problem List   Diagnosis Date Noted   Back pain 08/09/2024   Stroke-like symptoms 08/01/2024   Status post placement of implantable loop recorder 07/12/2024   Seizure disorder (HCC) 06/28/2024   Annual physical exam 07/10/2023   TIA (transient ischemic attack) 05/02/2023   GERD without esophagitis 05/02/2023   Carotid stenosis, symptomatic, with infarction (HCC) 04/26/2023   History of CVA (cerebrovascular accident) without residual deficits 03/04/2023   Hemiparesis affecting right side as late effect of cerebrovascular accident (CVA) (HCC) 02/22/2023   Chronic radicular pain of lower back 08/10/2022   Cervical spondylosis 03/11/2022   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   Bilateral carotid artery stenosis 03/11/2022   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   Lumbar spondylosis 10/07/2021   DDD (degenerative disc disease), lumbar 10/07/2021   History of radiation therapy 08/26/2021   Aortic atherosclerosis 04/17/2021   Diverticulosis 04/17/2021   Hypertension associated with diabetes (HCC) 10/07/2020   Overweight (BMI 25.0-29.9) 10/07/2020   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   Vitamin D  deficiency 08/01/2020   Polyp of sigmoid colon 08/01/2020   Insomnia 06/07/2020   Degenerative joint disease of hand 03/01/2020   BPH (benign prostatic hyperplasia) 08/05/2018   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   Macular scar of both eyes 04/20/2018   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   Scotoma involving central area of both eyes 04/20/2018   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   Fatty liver 03/25/2018   Erectile dysfunction 02/23/2017   Hx of CABG 01/24/2017   Advanced care planning/counseling discussion 10/21/2015   Coronary artery disease of native artery of native heart with stable angina pectoris    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   Dyslipidemia 01/02/2015   Osteoarthritis 01/02/2015   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/02/2014    ONSET DATE: 08/01/24  REFERRING DIAG: Z86.73 (ICD-10-CM) - History of CVA (cerebrovascular accident)   THERAPY DIAG:  Muscle weakness (generalized)  Difficulty in walking, not elsewhere classified  Abnormality of gait and mobility  Other abnormalities of gait and mobility  Pain in left hip  Bilateral low back pain without sciatica, unspecified chronicity  Rationale for Evaluation and Treatment: Rehabilitation  SUBJECTIVE:                                                                                                                                                                                             SUBJECTIVE STATEMENT: Patient reports doing well today with no pain upon arrival. He reports upon arrival that he prefers to warm up on the bike today.   PERTINENT HISTORY:  CVA and hospital  admission on 08/01/24. Pt referred after CVA. Pt was unable to get inpatient rehab but was able to get with home health. Pt had to go through a marathon with nurses and PT for home health. Pt did end up in PT with a HHPT he appreciated and decided he should come to outpatient services. Pt feels he is improving but is not at his pre morbid level. Pt using cane at the moment and uses transport chair to get to clinic.PMX of with history of left carotid artery  stent, CAD status post 5 vessel CABG, dyslipidemia, hypertension, history of stereotypic spells.   PAIN:  Are you having pain? No  PRECAUTIONS: Fall  WEIGHT BEARING RESTRICTIONS: No  FALLS: Has patient fallen in last 6 months? No  LIVING ENVIRONMENT: Lives with: lives with their spouse Lives in: House/apartment- Duplex Stairs: No Has following equipment at home: Single point cane and Walker - 2 wheeled  PLOF: Independent and Independent with basic ADLs  PATIENT GOALS: improve gait, improve R LE strength, improve balance and mobility.   OBJECTIVE:  Note: Objective measures were completed at Evaluation unless otherwise noted.  DIAGNOSTIC FINDINGS: 1. Scattered patchy small volume acute ischemic nonhemorrhagic left MCA distribution infarcts as above. No associated mass effect. 2. Underlying chronic left frontoparietal infarct, with additional small remote lacunar infarcts about the left greater than right basal ganglia. 3. Underlying age-related cerebral atrophy with mild chronic small vessel ischemic disease.  COGNITION: Overall cognitive status: Within functional limits for tasks assessed   SENSATION: WFL  LOWER EXTREMITY ROM:    WNL for tasks assessed   LOWER EXTREMITY MMT:    MMT Right Eval Left Eval  Hip flexion 4 4+  Hip extension    Hip abduction 4 4+  Hip adduction 4+ 4+  Hip internal rotation    Hip external rotation    Knee flexion 4 4+  Knee extension 4 4+  Ankle dorsiflexion 4 4+  Ankle plantarflexion    Ankle inversion    Ankle eversion    (Blank rows = not tested)  FUNCTIONAL TESTS:  5 times sit to stand: 21.23 sec Timed up and go (TUG): test visit 2  6 minute walk test: 554 ft with CGA  BERG 44 : .58 m/s  PATIENT SURVEYS:  SIS 16: 65.6 %                                                                                                                             TREATMENT DATE: 09/27/24   -Nustep LVL 2-5 8 min. Lower Extremity.    NMR:   - Step ups: 6'' 2x10 each.  -Tandem stance: 2x30'' each.  -Sit to stands with 5 kg medicine ball. 2x10 -Romberg EC on airex at balance station 3x30''.   -Plank walk in // bars tandem x2 laps and side-stepping x2 laps.  -Forward/backward resisted walking with cable machine 3x each with 12.5 lb.  -Lateral side-steps resisted with cable machine 2x  each with 7.5 lb.   SIS Survey: 81.25%    PATIENT EDUCATION: Education details: Pt educated throughout session about proper posture and technique with exercises. Improved exercise technique, movement at target joints, use of target muscles after min to mod verbal, visual, tactile cues. Person educated: Patient Education method: Explanation Education comprehension: verbalized understanding  HOME EXERCISE PROGRAM: Access Code: [email or web address] URL: [email or web address] Date: 08/28/2024 Prepared by: Lonni Gainer  Exercises - Sit to Stand Without Arm Support  - 1 x daily - 7 x weekly - 3 sets - 10 reps - Mini Lunge with Counter Support   - 1 x daily - 7 x weekly - 2 sets - 10 reps - Side Stepping with Resistance at Ankles and Counter Support  - 1 x daily - 7 x weekly - 2 sets - 10 reps - Marching Near Counter  - 1 x daily - 7 x weekly - 2 sets - 10 reps  SHORT TERM GOALS: Target date: 09/19/2024 Patient will be independent in home exercise program to improve strength/mobility for better functional independence with ADLs. Baseline: No HEP currently  Goal status: INITIAL  LONG TERM GOALS: Target date: 11/14/2024 1.  Patient will complete five times sit to stand test in < 15 seconds indicating an increased LE strength and improved balance. Baseline: 21.23 sec  Goal status: INITIAL  2.  Patient will improve SIS 16 score to 75 to demonstrate statistically significant improvement in mobility and quality of life as it relates to their CVA functional deficits.  Baseline: 65.6% Goal status: INITIAL   3.  Patient will  increase Berg Balance score by > 6 points to demonstrate decreased fall risk during functional activities. Baseline: 44 Goal status: INITIAL   4.  Patient will reduce timed up and go to <11 seconds to reduce fall risk and demonstrate improved transfer/gait ability. Baseline: 13.11 sec  Goal status: INITIAL  5.  Patient will increase 10 meter walk test to >1.66m/s as to improve gait speed for better community ambulation and to reduce fall risk. Baseline: .58 m/s Goal status: INITIAL  6.  Patient will increase six minute walk test distance to >1000 for progression to community ambulator and improve gait ability Baseline: 554 ft with quad cane and CGA Goal status: INITIAL  ASSESSMENT:  CLINICAL IMPRESSION: Pt warmed up on Nustep per his request. He then performed various balance exercises both static and dynamic while in standing position. Patient noted to have continued decreased balance requiring CGA to MIN. A. To maintain balance throughout today's treatment session. It was noted that he tends to lose balance posteriorly more often than other directions and he stands with center of gravity more posteriorly on the heels with the hips extended making resisted walking forward more difficult than other directions. Overall, patient continues to work hard to improve and is very motivated throughout his treatment sessions. Pt will continue to benefit from skilled physical therapy intervention to address impairments, improve QOL, and attain therapy goals   *Gait belt donned throughout today's treatment session for increased safety.    OBJECTIVE IMPAIRMENTS: Abnormal gait, decreased activity tolerance, decreased balance, decreased endurance, decreased mobility, difficulty walking, decreased strength, and hypomobility.   ACTIVITY LIMITATIONS: standing, squatting, stairs, transfers, and locomotion level  PARTICIPATION LIMITATIONS: shopping, community activity, and yard work  PERSONAL FACTORS: Age  and 3+ comorbidities:  with history of left carotid artery stent, CAD status post 5 vessel CABG, dyslipidemia, hypertension, history of stereotypic spells are also affecting patient's functional outcome.  REHAB POTENTIAL: Good  CLINICAL DECISION MAKING: Evolving/moderate complexity  EVALUATION COMPLEXITY: Moderate  PLAN:  PT FREQUENCY: 2x/week  PT DURATION: 12 weeks  PLANNED INTERVENTIONS: 97750- Physical Performance Testing, 97110-Therapeutic exercises, 97530- Therapeutic activity, [id number]- Neuromuscular re-education, 97535- Self Care, [id number]- Manual therapy, [phone]- Gait training, Patient/Family education, Balance training, and Stair training  PLAN FOR NEXT SESSION:  Continue working to improve LE strength and balance with progressive exercises to patient's tolerance level.   3:07 PM, 09/27/24 Norman Sharps, PT, DPT Physical Therapist - [place]  [facility] Outpatient Physical Therapy- [facility] [phone]

## 2024-10-02 ENCOUNTER — Ambulatory Visit: Attending: Nurse Practitioner

## 2024-10-02 ENCOUNTER — Ambulatory Visit: Payer: Self-pay | Admitting: Cardiology

## 2024-10-02 DIAGNOSIS — M25552 Pain in left hip: Secondary | ICD-10-CM | POA: Diagnosis present

## 2024-10-02 DIAGNOSIS — G8929 Other chronic pain: Secondary | ICD-10-CM | POA: Insufficient documentation

## 2024-10-02 DIAGNOSIS — M25512 Pain in left shoulder: Secondary | ICD-10-CM | POA: Diagnosis present

## 2024-10-02 DIAGNOSIS — R269 Unspecified abnormalities of gait and mobility: Secondary | ICD-10-CM | POA: Insufficient documentation

## 2024-10-02 DIAGNOSIS — R262 Difficulty in walking, not elsewhere classified: Secondary | ICD-10-CM | POA: Insufficient documentation

## 2024-10-02 DIAGNOSIS — R2689 Other abnormalities of gait and mobility: Secondary | ICD-10-CM | POA: Diagnosis present

## 2024-10-02 DIAGNOSIS — M545 Low back pain, unspecified: Secondary | ICD-10-CM | POA: Insufficient documentation

## 2024-10-02 DIAGNOSIS — M6281 Muscle weakness (generalized): Secondary | ICD-10-CM | POA: Diagnosis present

## 2024-10-02 DIAGNOSIS — M25612 Stiffness of left shoulder, not elsewhere classified: Secondary | ICD-10-CM | POA: Insufficient documentation

## 2024-10-02 NOTE — Therapy (Signed)
 OUTPATIENT PHYSICAL THERAPY TREATMENT  Patient Name: Shannon Chung MRN: [id number] DOB:02-Sep-1941, 83 y.o., male Today's Date: 10/02/2024  PCP: Gretel App, NP  REFERRING PROVIDER: Gretel App, NP  END OF SESSION:  PT End of Session - 10/02/24 1406     Visit Number 12    Number of Visits 24    Date for Recertification  11/15/23    Authorization Type [employer] Medicare    Authorization Time Period 10/21-1/13/26 for 24 visits    Progress Note Due on Visit 20    PT Start Time 1402    PT Stop Time 1444    PT Time Calculation (min) 42 min    Equipment Utilized During Treatment Gait belt    Activity Tolerance Patient tolerated treatment well;No increased pain    Behavior During Therapy [facility] for tasks assessed/performed            Past Medical History:  Diagnosis Date   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 06/2015 Cardiac CT: Ca score 1103 (84th %'ile);  b. 07/2015 Cath: LM 70, LAD 80p, 100/21m, D1 70, D2 95, RI 75, RCA 100p/m;  c. 07/2015 CABG x 5 (LIMA->LAD, VG->Diag, VG->OM1->OM2, VG->OM3).   COVID-19    12/2021   COVID-19 01/18/2022   COVID-19 vaccine administered 01/18/2022   Unknown how many vaccine doses have been received. Entered from Emergency Triage Note.   Diabetes mellitus without complication (HCC) 07/2015   Dyslipidemia    Essential hypertension    Essential hypertension 01/02/2015   Formatting of this note might be different from the original.  Last Assessment & Plan:   Chronic, stable. Continue current regimen.   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 dilated, EF 35% improved to 45-50% (2015);  b. 07/2015 EF 25-35% by LV gram.   Ischemic cardiomyopathy 01/02/2015   Kidney stones 04/17/2021   Lone atrial fibrillation (HCC) 1983   a. isolated episode, not on OAC.   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 06/2014: LV; resolved with coumadin-->no residual on f/u echo, no longer on coumadin.   Mural  thrombus of heart 08/26/2021   Formatting of this note might be different from the original. Jun 16, 2019 Entered By: VINIE ALLEAN AQUAS Comment: left ventricle, cardiac apex   Osteoarthritis    a. 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 Jun 16, 2019 Entered By: VINIE ALLEAN AQUAS Comment: leftAug 19, 2020 Entered By: VINIE ALLEAN AQUAS Comment: resolved by total left knee replacement Jun 16, 2019 Entered By: VINIE ALLEAN AQUAS Comment: leftAug 19, 2020 Entered By: VINIE ALLEAN AQUAS Comment: resolved by total left knee replacement   Trigger finger of left hand 07/07/2019   Vitamin D  deficiency    Past Surgical History:  Procedure Laterality Date   BICEPS TENDON REPAIR Right 1993   CARDIAC CATHETERIZATION N/A 07/05/2015   Procedure: Left Heart Cath and Coronary Angiography;  Surgeon: Evalene JINNY Lunger, MD;  Location: [facility] INVASIVE CV LAB;  Service: Cardiovascular;  Laterality: N/A;   CAROTID PTA/STENT INTERVENTION Left 04/26/2023   Procedure: CAROTID PTA/STENT INTERVENTION;  Surgeon: Marea Selinda RAMAN, MD;  Location: [facility] INVASIVE CV LAB;  Service: Cardiovascular;  Laterality: Left;   CATARACT EXTRACTION Left 12/2016   with keratoplasty   COLONOSCOPY  2007   COLONOSCOPY WITH PROPOFOL  N/A 12/02/2017   TA, SSA, rpt 3 yrs(Tahiliani, Varnita B, MD)   COLONOSCOPY WITH PROPOFOL  N/A 11/26/2020   Procedure: COLONOSCOPY WITH PROPOFOL ;  Surgeon: Jinny Carmine, MD;  Location: [facility] ENDOSCOPY;  Service: Endoscopy;  Laterality: N/A;   CORONARY ARTERY BYPASS GRAFT N/A 07/29/2015   Procedure: CORONARY ARTERY BYPASS GRAFTING (CABG) x 5 (LIMA to LAD, SVG to DIAGONAL,  SVG SEQUENTIALLY to OM1 and OM2, SVG to OM3) with Endoscopic Vein Havesting of  GREATER SAPHENOUS VEIN from RIGHT THIGH and partial LOWER LEG ;  Surgeon: Dorise MARLA Fellers, MD;  Location: [facility] OR;  Service: Open Heart Surgery;  Laterality: N/A;   EYE SURGERY     b/l cataract and cornea replaced    HAND SURGERY     left hand 1st/2nd trigger  fingers Dr. Cleotilde ortho    JOINT REPLACEMENT     KNEE ARTHROSCOPY Left remote   MOHS SURGERY     left cheek scc 2022 Dr. Lloyd   MOHS SURGERY     x 5 facial scc   right biceps tendon     repair/re attachment    SKIN CANCER EXCISION  10/2015   BCC - L ala (pending MOHs) and L scapula (complete excision)   TEE WITHOUT CARDIOVERSION N/A 07/29/2015   Procedure: TRANSESOPHAGEAL ECHOCARDIOGRAM (TEE);  Surgeon: Dorise MARLA Fellers, MD;  Location: [facility] OR;  Service: Open Heart Surgery;  Laterality: N/A;   TONSILLECTOMY  1949   TOTAL KNEE ARTHROPLASTY Left 03/18/2016   cemented L TKR; Kayla Cleotilde, MD  Patient Active Problem List   Diagnosis Date Noted   Back pain 08/09/2024   Stroke-like symptoms 08/01/2024   Status post placement of implantable loop recorder 07/12/2024   Seizure disorder (HCC) 06/28/2024   Annual physical exam 07/10/2023   TIA (transient ischemic attack) 05/02/2023   GERD without esophagitis 05/02/2023   Carotid stenosis, symptomatic, with infarction (HCC) 04/26/2023   History of CVA (cerebrovascular accident) without residual deficits 03/04/2023   Hemiparesis affecting right side as late effect of cerebrovascular accident (CVA) (HCC) 02/22/2023   Chronic radicular pain of lower back 08/10/2022   Cervical spondylosis 03/11/2022   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   Bilateral carotid artery stenosis 03/11/2022   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   Lumbar spondylosis 10/07/2021   DDD (degenerative disc disease), lumbar 10/07/2021   History of radiation therapy 08/26/2021   Aortic atherosclerosis 04/17/2021   Diverticulosis 04/17/2021   Hypertension associated with diabetes (HCC) 10/07/2020   Overweight (BMI 25.0-29.9) 10/07/2020   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   Vitamin D  deficiency 08/01/2020   Polyp of sigmoid colon 08/01/2020   Insomnia 06/07/2020   Degenerative joint disease of hand 03/01/2020   BPH (benign prostatic hyperplasia) 08/05/2018   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   Macular scar of both eyes 04/20/2018   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   Scotoma involving central area of both eyes 04/20/2018   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   Fatty liver 03/25/2018   Erectile dysfunction 02/23/2017   Hx of CABG 01/24/2017   Advanced care planning/counseling discussion 10/21/2015   Coronary artery disease of native artery of native heart with stable angina pectoris    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   Dyslipidemia 01/02/2015   Osteoarthritis 01/02/2015   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/02/2014    ONSET DATE: 08/01/24  REFERRING DIAG: Z86.73 (ICD-10-CM) - History of CVA (cerebrovascular accident)   THERAPY DIAG:  Muscle weakness (generalized)  Difficulty in walking, not elsewhere classified  Abnormality of gait and mobility  Other abnormalities of gait and mobility  Pain in left hip  Bilateral low back pain without sciatica, unspecified chronicity  Rationale for Evaluation and Treatment: Rehabilitation  SUBJECTIVE:                                                                                                                                                                                             SUBJECTIVE STATEMENT: Patient reports doing well today with no pain upon arrival. He reports upon arrival that he prefers to warm up on the bike today.   PERTINENT HISTORY:  CVA and hospital  admission on 08/01/24. Pt referred after CVA. Pt was unable to get inpatient rehab but was able to get with home health. Pt had to go through a marathon with nurses and PT for home health. Pt did end up in PT with a HHPT he appreciated and decided he should come to outpatient services. Pt feels he is improving but is not at his pre morbid level. Pt using cane at the moment and uses transport chair to get to clinic.PMX of with history of left carotid artery  stent, CAD status post 5 vessel CABG, dyslipidemia, hypertension, history of stereotypic spells.   PAIN:  Are you having pain? No  PRECAUTIONS: Fall  WEIGHT BEARING RESTRICTIONS: No  FALLS: Has patient fallen in last 6 months? No  LIVING ENVIRONMENT: Lives with: lives with their spouse Lives in: House/apartment- Duplex Stairs: No Has following equipment at home: Single point cane and Walker - 2 wheeled  PLOF: Independent and Independent with basic ADLs  PATIENT GOALS: improve gait, improve R LE strength, improve balance and mobility.   OBJECTIVE:  Note: Objective measures were completed at Evaluation unless otherwise noted.  DIAGNOSTIC FINDINGS: 1. Scattered patchy small volume acute ischemic nonhemorrhagic left MCA distribution infarcts as above. No associated mass effect. 2. Underlying chronic left frontoparietal infarct, with additional small remote lacunar infarcts about the left greater than right basal ganglia. 3. Underlying age-related cerebral atrophy with mild chronic small vessel ischemic disease.  COGNITION: Overall cognitive status: Within functional limits for tasks assessed   SENSATION: WFL  LOWER EXTREMITY ROM:    WNL for tasks assessed   LOWER EXTREMITY MMT:    MMT Right Eval Left Eval  Hip flexion 4 4+  Hip extension    Hip abduction 4 4+  Hip adduction 4+ 4+  Hip internal rotation    Hip external rotation    Knee flexion 4 4+  Knee extension 4 4+  Ankle dorsiflexion 4 4+  Ankle plantarflexion    Ankle inversion    Ankle eversion    (Blank rows = not tested)  FUNCTIONAL TESTS:  5 times sit to stand: 21.23 sec Timed up and go (TUG): test visit 2  6 minute walk test: 554 ft with CGA  BERG 44 : .58 m/s  PATIENT SURVEYS:  SIS 16: 65.6 %                                                                                                                             TREATMENT DATE: 10/02/24   -Nustep LVL 2-5 8 min. Lower Extremity only.     - Step ups: 6'' 2x10 each.   -Romberg EC on airex at balance station 3x30''.   -Tandem stance: 2x30'' each.   -Sit to stands with 5 kg medicine ball. 2x10  -Toe taps on 10'' step x15 each.   -Resisted side-stepping: GTB x15' -2 laps.   -Bilateral hip 3-way at balance station: GTB 2x10 each.   -Marching at  balance station: GTB 15x each.        PATIENT EDUCATION: Education details: Pt educated throughout session about proper posture and technique with exercises. Improved exercise technique, movement at target joints, use of target muscles after min to mod verbal, visual, tactile cues. Person educated: Patient Education method: Explanation Education comprehension: verbalized understanding  HOME EXERCISE PROGRAM: Access Code: [email or web address] URL: [email or web address] Date: 10/02/2024 Prepared by: Norman Sharps  Exercises - Standing Hip Flexion with Resistance Loop  - 1 x daily - 7 x weekly - 2 sets - 10 reps - Hip Extension with Resistance Loop  - 1 x daily - 7 x weekly - 2 sets - 10 reps - Hip Abduction with Resistance Loop  - 1 x daily - 7 x weekly - 2 sets - 10 reps - Standing Marching  - 1 x daily - 7 x weekly - 2 sets - 10 reps --------------------------------------------------------------------------------   Access Code: [email or web address] URL: [email or web address] Date: 08/28/2024 Prepared by: Lonni Gainer  Exercises - Sit to Stand Without Arm Support  - 1 x daily - 7 x weekly - 3 sets - 10 reps - Mini Lunge with Counter Support   - 1 x daily - 7 x weekly - 2 sets - 10 reps - Side Stepping with Resistance at Ankles and Counter Support  - 1 x daily - 7 x weekly - 2 sets - 10 reps - Marching Near Counter  - 1 x daily - 7 x weekly - 2 sets - 10 reps  SHORT TERM GOALS: Target date: 09/19/2024 Patient will be independent in home exercise program to improve strength/mobility for better functional independence with ADLs. Baseline: No HEP  currently  Goal status: INITIAL  LONG TERM GOALS: Target date: 11/14/2024 1.  Patient will complete five times sit to stand test in < 15 seconds indicating an increased LE strength and improved balance. Baseline: 21.23 sec  Goal status: INITIAL  2.  Patient will improve SIS 16 score to 75 to demonstrate statistically significant improvement in mobility and quality of life as it relates to their CVA functional deficits.  Baseline: 65.6% Goal status: INITIAL   3.  Patient will increase Berg Balance score by > 6 points to demonstrate decreased fall risk during functional activities. Baseline: 44 Goal status: INITIAL   4.  Patient will reduce timed up and go to <11 seconds to reduce fall risk and demonstrate improved transfer/gait ability. Baseline: 13.11 sec  Goal status: INITIAL  5.  Patient will increase 10 meter walk test to >1.42m/s as to improve gait speed for better community ambulation and to reduce fall risk. Baseline: .58 m/s Goal status: INITIAL  6.  Patient will increase six minute walk test distance to >1000 for progression to community ambulator and improve gait ability Baseline: 554 ft with quad cane and CGA Goal status: INITIAL  ASSESSMENT:  CLINICAL IMPRESSION: Pt warmed up on Nustep today. He then performed various exercises working to improve balance including toe taps progressing to 10'', tandem stance, and romberg on Airex with EC. Patient also asked if there were any exercises to strengthen his hips, therefore, I added Theraband hip 3-way for increased LE strength and printed him an updated HEP for him to perform at home. Patient tolerated treatment well, but continues to have decreased balance and decreased LE strength noted throughout treatment session. He would benefit from continuing PT treatments at this time for further progression toward his goals.   *Gait belt donned throughout today's treatment session for increased  safety.    OBJECTIVE IMPAIRMENTS:  Abnormal gait, decreased activity tolerance, decreased balance, decreased endurance, decreased mobility, difficulty walking, decreased strength, and hypomobility.   ACTIVITY LIMITATIONS: standing, squatting, stairs, transfers, and locomotion level  PARTICIPATION LIMITATIONS: shopping, community activity, and yard work  PERSONAL FACTORS: Age and 3+ comorbidities:  with history of left carotid artery stent, CAD status post 5 vessel CABG, dyslipidemia, hypertension, history of stereotypic spells are also affecting patient's functional outcome.   REHAB POTENTIAL: Good  CLINICAL DECISION MAKING: Evolving/moderate complexity  EVALUATION COMPLEXITY: Moderate  PLAN:  PT FREQUENCY: 2x/week  PT DURATION: 12 weeks  PLANNED INTERVENTIONS: 97750- Physical Performance Testing, 97110-Therapeutic exercises, 97530- Therapeutic activity, 97112- Neuromuscular re-education, 97535- Self Care, [id number]- Manual therapy, [phone]- Gait training, Patient/Family education, Balance training, and Stair training  PLAN FOR NEXT SESSION:  Continue working to improve LE strength and balance with progressive exercises to patient's tolerance level.   4:03 PM, 10/02/24 Norman Sharps, PT, DPT Physical Therapist - [facility]  [facility] Outpatient Physical Therapy- Main Campus [phone]

## 2024-10-03 NOTE — Assessment & Plan Note (Addendum)
 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   - refer back to Rad/Onc, discuss full workup and curative intent treatment - watchful waiting until symptoms +/- radiographic metastasis, hold on ADT

## 2024-10-03 NOTE — Progress Notes (Signed)
   10/06/2024 11:00 AM   Antonis Lyell Mulhall 1941/08/09 [id number]  Reason for visit: Follow up prostate Ca   HPI: 83 y.o. male, initial follow up with me today, previously seen by Dr. Penne in May 2025 First time meeting Very pleasant gentleman Works as a agricultural consultant in endoscopy center here  Prior HPI: Hx of prostate Ca  - GS 3+4 =7 (dx in Nov 2023), PSA 4.8  - elected AS (s/p multiple opinions at [facility] and [place])  - MRI Nov 2024 (for mechanical fall) - incidental Right acetabulum lesion- thoroughly reviewed including GU tumor board, felt to represent traumatic lesion rather than metastasis  - PSMA/PET (Dec 2024) - left prostatic uptake, broad R acetabular uptake  Recently admitted Oct 2025 with acute stroke, mild aphasia -now completely recovered, no deficits    Physical Exam: BP 139/86   Pulse 72   Ht 6' (1.829 m)   Wt 190 lb (86.2 kg)   BMI 25.77 kg/m    Constitutional:  Alert and oriented, No acute distress.  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  I do think he would be a reasonable candidate for restaging and definitive radiation treatment.  However, I had a frank conversation regarding overall survival, competing comorbidity, cancer specific survival, as well as watchful  waiting with onset of metastasis, metastasis rate related quality of life, and time from metastasis to possible cancer related death.    Offered several management options today, considering his desire for less aggressive diagnostics/treatment:  1.  Restage with prostate MRI and interval biopsy, possible PSMA/PET  2.  Repeat 12 core prostate biopsy only  3.  Referral to radiation oncology for definitive treatment options  4.  Slow play approach with PSA in 4-6 months, watchful waiting  Ultimately patient preferred a less aggressive approach  -Plan for PSA in 4 months  -Will also rerefer to Dr. Lenn to revisit radiation options  Orders: -     Ambulatory referral to Radiation Oncology -     PSA; Future  Benign prostatic hyperplasia, unspecified whether lower urinary tract symptoms present Assessment & Plan: Mild progressive BPH LUTS Has not tried prior medical therapy  - Will start empiric Flomax  0.4 mg nightly.  Risk/benefits discussed, patient agreed   Other orders -     Tamsulosin  HCl; Take 1 capsule (0.4 mg total) by mouth daily.  Dispense: 90 capsule; Refill: 2       Penne JONELLE Skye, MD  [facility] [address] [place], [place] [address] [phone]

## 2024-10-04 ENCOUNTER — Ambulatory Visit

## 2024-10-05 ENCOUNTER — Encounter

## 2024-10-05 ENCOUNTER — Ambulatory Visit

## 2024-10-05 DIAGNOSIS — R269 Unspecified abnormalities of gait and mobility: Secondary | ICD-10-CM

## 2024-10-05 DIAGNOSIS — M25552 Pain in left hip: Secondary | ICD-10-CM

## 2024-10-05 DIAGNOSIS — M6281 Muscle weakness (generalized): Secondary | ICD-10-CM

## 2024-10-05 DIAGNOSIS — R2689 Other abnormalities of gait and mobility: Secondary | ICD-10-CM

## 2024-10-05 DIAGNOSIS — M545 Low back pain, unspecified: Secondary | ICD-10-CM

## 2024-10-05 DIAGNOSIS — R262 Difficulty in walking, not elsewhere classified: Secondary | ICD-10-CM

## 2024-10-05 NOTE — Therapy (Signed)
 OUTPATIENT PHYSICAL THERAPY TREATMENT  Patient Name: Shannon Chung MRN: [id number] DOB:1941/05/13, 83 y.o., male Today's Date: 10/05/2024  PCP: Gretel App, NP  REFERRING PROVIDER: Gretel App, NP  END OF SESSION:  PT End of Session - 10/05/24 1314     Visit Number 13    Number of Visits 24    Date for Recertification  11/15/23    Authorization Type [employer] Medicare    Authorization Time Period 10/21-1/13/26 for 24 visits    Progress Note Due on Visit 20    PT Start Time 1314    Equipment Utilized During Treatment Gait belt    Activity Tolerance Patient tolerated treatment well;No increased pain    Behavior During Therapy [facility] for tasks assessed/performed            Past Medical History:  Diagnosis Date   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 06/2015 Cardiac CT: Ca score 1103 (84th %'ile);  b. 07/2015 Cath: LM 70, LAD 80p, 100/74m, D1 70, D2 95, RI 75, RCA 100p/m;  c. 07/2015 CABG x 5 (LIMA->LAD, VG->Diag, VG->OM1->OM2, VG->OM3).   COVID-19    12/2021   COVID-19 01/18/2022   COVID-19 vaccine administered 01/18/2022   Unknown how many vaccine doses have been received. Entered from Emergency Triage Note.   Diabetes mellitus without complication (HCC) 07/2015   Dyslipidemia    Essential hypertension    Essential hypertension 01/02/2015   Formatting of this note might be different from the original.  Last Assessment & Plan:   Chronic, stable. Continue current regimen.   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 dilated, EF 35% improved to 45-50% (2015);  b. 07/2015 EF 25-35% by LV gram.   Ischemic cardiomyopathy 01/02/2015   Kidney stones 04/17/2021   Lone atrial fibrillation (HCC) 1983   a. isolated episode, not on OAC.   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 06/2014: LV; resolved with coumadin-->no residual on f/u echo, no longer on coumadin.   Mural thrombus of heart 08/26/2021   Formatting of this note might be different from the original.  Jun 16, 2019 Entered By: VINIE ALLEAN AQUAS Comment: left ventricle, cardiac apex   Osteoarthritis    a. 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 Jun 16, 2019 Entered By: VINIE ALLEAN AQUAS Comment: leftAug 19, 2020 Entered By: VINIE ALLEAN AQUAS Comment: resolved by total left knee replacement Jun 16, 2019 Entered By: VINIE ALLEAN AQUAS Comment: leftAug 19, 2020 Entered By: VINIE ALLEAN AQUAS Comment: resolved by total left knee replacement   Trigger finger of left hand 07/07/2019   Vitamin D  deficiency    Past Surgical History:  Procedure Laterality Date   BICEPS TENDON REPAIR Right 1993   CARDIAC CATHETERIZATION N/A 07/05/2015   Procedure: Left Heart Cath and Coronary Angiography;  Surgeon: Evalene JINNY Lunger, MD;  Location: [facility] INVASIVE CV LAB;  Service: Cardiovascular;  Laterality: N/A;   CAROTID PTA/STENT INTERVENTION Left 04/26/2023   Procedure: CAROTID PTA/STENT INTERVENTION;  Surgeon: Marea Selinda RAMAN, MD;  Location: [facility] INVASIVE CV LAB;  Service: Cardiovascular;  Laterality: Left;   CATARACT EXTRACTION Left 12/2016   with keratoplasty   COLONOSCOPY  2007   COLONOSCOPY WITH PROPOFOL  N/A 12/02/2017   TA, SSA, rpt 3 yrs(Tahiliani, Varnita B, MD)   COLONOSCOPY WITH PROPOFOL  N/A 11/26/2020   Procedure: COLONOSCOPY WITH PROPOFOL ;  Surgeon: Jinny Carmine, MD;  Location: [facility] ENDOSCOPY;  Service: Endoscopy;  Laterality: N/A;   CORONARY ARTERY BYPASS GRAFT N/A 07/29/2015   Procedure: CORONARY ARTERY BYPASS GRAFTING (CABG) x 5 (LIMA to LAD, SVG to DIAGONAL,  SVG SEQUENTIALLY to OM1 and OM2, SVG to OM3) with Endoscopic Vein Havesting of  GREATER SAPHENOUS VEIN from RIGHT THIGH and partial LOWER LEG ;  Surgeon: Dorise MARLA Fellers, MD;  Location: [facility] OR;  Service: Open Heart Surgery;  Laterality: N/A;   EYE SURGERY     b/l cataract and cornea replaced    HAND SURGERY     left hand 1st/2nd trigger fingers Dr. Cleotilde ortho    JOINT REPLACEMENT      KNEE ARTHROSCOPY Left remote   MOHS SURGERY     left cheek scc 2022 Dr. Lloyd   MOHS SURGERY     x 5 facial scc   right biceps tendon     repair/re attachment    SKIN CANCER EXCISION  10/2015   BCC - L ala (pending MOHs) and L scapula (complete excision)   TEE WITHOUT CARDIOVERSION N/A 07/29/2015   Procedure: TRANSESOPHAGEAL ECHOCARDIOGRAM (TEE);  Surgeon: Dorise MARLA Fellers, MD;  Location: [facility] OR;  Service: Open Heart Surgery;  Laterality: N/A;   TONSILLECTOMY  1949   TOTAL KNEE ARTHROPLASTY Left 03/18/2016   cemented L TKR; Kayla Cleotilde, MD   Patient Active Problem List   Diagnosis Date Noted   Back pain 08/09/2024  Stroke-like symptoms 08/01/2024   Status post placement of implantable loop recorder 07/12/2024   Seizure disorder (HCC) 06/28/2024   Annual physical exam 07/10/2023   TIA (transient ischemic attack) 05/02/2023   GERD without esophagitis 05/02/2023   Carotid stenosis, symptomatic, with infarction (HCC) 04/26/2023   History of CVA (cerebrovascular accident) without residual deficits 03/04/2023   Hemiparesis affecting right side as late effect of cerebrovascular accident (CVA) (HCC) 02/22/2023   Chronic radicular pain of lower back 08/10/2022   Cervical spondylosis 03/11/2022   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   Bilateral carotid artery stenosis 03/11/2022   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   Lumbar spondylosis 10/07/2021   DDD (degenerative disc disease), lumbar 10/07/2021   History of radiation therapy 08/26/2021   Aortic atherosclerosis 04/17/2021   Diverticulosis 04/17/2021   Hypertension associated with diabetes (HCC) 10/07/2020   Overweight (BMI 25.0-29.9) 10/07/2020   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   Vitamin D  deficiency 08/01/2020   Polyp of sigmoid colon 08/01/2020   Insomnia 06/07/2020   Degenerative joint disease of hand 03/01/2020   BPH (benign prostatic hyperplasia) 08/05/2018   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   Macular scar  of both eyes 04/20/2018   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   Scotoma involving central area of both eyes 04/20/2018   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   Fatty liver 03/25/2018   Erectile dysfunction 02/23/2017   Hx of CABG 01/24/2017   Advanced care planning/counseling discussion 10/21/2015   Coronary artery disease of native artery of native heart with stable angina pectoris    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  Rationale for Evaluation and Treatment: Rehabilitation  SUBJECTIVE:                                                                                                                                                                                             SUBJECTIVE STATEMENT: Patient reports doing good today. He was a little sore following the initiation of hip 3-way exercises, but his soreness has reduced with time. He reports he really likes the hip exercises for increased strength.  PERTINENT HISTORY:  CVA and hospital admission on 08/01/24. Pt referred after CVA. Pt was unable to get inpatient rehab but was able to get with home health. Pt had to go through a marathon with nurses and PT for home health. Pt did end up  in PT with a HHPT he appreciated and decided he should come to outpatient services. Pt feels he is improving but is not at his pre morbid level. Pt using cane at the moment and uses transport chair to get to clinic.PMX of with history of left carotid artery stent, CAD status post 5 vessel CABG, dyslipidemia, hypertension, history of stereotypic spells.   PAIN:  Are you having pain? No  PRECAUTIONS: Fall  WEIGHT BEARING RESTRICTIONS: No  FALLS: Has  patient fallen in last 6 months? No  LIVING ENVIRONMENT: Lives with: lives with their spouse Lives in: House/apartment- Duplex Stairs: No Has following equipment at home: Single point cane and Walker - 2 wheeled  PLOF: Independent and Independent with basic ADLs  PATIENT GOALS: improve gait, improve R LE strength, improve balance and mobility.   OBJECTIVE:  Note: Objective measures were completed at Evaluation unless otherwise noted.  DIAGNOSTIC FINDINGS: 1. Scattered patchy small volume acute ischemic nonhemorrhagic left MCA distribution infarcts as above. No associated mass effect. 2. Underlying chronic left frontoparietal infarct, with additional small remote lacunar infarcts about the left greater than right basal ganglia. 3. Underlying age-related cerebral atrophy with mild chronic small vessel ischemic disease.  COGNITION: Overall cognitive status: Within functional limits for tasks assessed   SENSATION: WFL  LOWER EXTREMITY ROM:    WNL for tasks assessed   LOWER EXTREMITY MMT:    MMT Right Eval Left Eval  Hip flexion 4 4+  Hip extension    Hip abduction 4 4+  Hip adduction 4+ 4+  Hip internal rotation    Hip external rotation    Knee flexion 4 4+  Knee extension 4 4+  Ankle dorsiflexion 4 4+  Ankle plantarflexion    Ankle inversion    Ankle eversion    (Blank rows = not tested)  FUNCTIONAL TESTS:  5 times sit to stand: 21.23 sec Timed up and go (TUG): test visit 2  6 minute walk test: 554 ft with CGA  BERG 44 : .58 m/s  PATIENT SURVEYS:  SIS 16: 65.6 %                                                                                                                             TREATMENT DATE: 10/05/24   -Nustep LVL 2-5 8 min. Lower Extremity only.    -Sit to stands with 5 kg medicine ball. 3x10  -Romberg EC on airex at balance station 3x30''.   -Marching on Airex 2x10 each at balance station.   -Ball bounce while standing on Airex -  30x  -Ball catch with forward step x20 each.    -Gait x600' (1 large lap, 1 small lap) with CGA for safety and verb cues for mechanics.       PATIENT EDUCATION: Education details: Pt educated throughout session about proper posture and technique with exercises. Improved exercise technique, movement at target joints, use of target muscles after min to mod verbal, visual,  tactile cues. Person educated: Patient Education method: Explanation Education comprehension: verbalized understanding  HOME EXERCISE PROGRAM: Access Code: [email or web address] URL: [email or web address] Date: 10/02/2024 Prepared by: Norman Sharps  Exercises - Standing Hip Flexion with Resistance Loop  - 1 x daily - 7 x weekly - 2 sets - 10 reps - Hip Extension with Resistance Loop  - 1 x daily - 7 x weekly - 2 sets - 10 reps - Hip Abduction with Resistance Loop  - 1 x daily - 7 x weekly - 2 sets - 10 reps - Standing Marching  - 1 x daily - 7 x weekly - 2 sets - 10 reps --------------------------------------------------------------------------------   Access Code: [email or web address] URL: [email or web address] Date: 08/28/2024 Prepared by: Lonni Gainer  Exercises - Sit to Stand Without Arm Support  - 1 x daily - 7 x weekly - 3 sets - 10 reps - Mini Lunge with Counter Support   - 1 x daily - 7 x weekly - 2 sets - 10 reps - Side Stepping with Resistance at Ankles and Counter Support  - 1 x daily - 7 x weekly - 2 sets - 10 reps - Marching Near Counter  - 1 x daily - 7 x weekly - 2 sets - 10 reps  SHORT TERM GOALS: Target date: 09/19/2024 Patient will be independent in home exercise program to improve strength/mobility for better functional independence with ADLs. Baseline: No HEP currently  Goal status: INITIAL  LONG TERM GOALS: Target date: 11/14/2024 1.  Patient will complete five times sit to stand test in < 15 seconds indicating an increased LE strength and improved balance. Baseline: 21.23 sec   Goal status: INITIAL  2.  Patient will improve SIS 16 score to 75 to demonstrate statistically significant improvement in mobility and quality of life as it relates to their CVA functional deficits.  Baseline: 65.6% Goal status: INITIAL   3.  Patient will increase Berg Balance score by > 6 points to demonstrate decreased fall risk during functional activities. Baseline: 44 Goal status: INITIAL   4.  Patient will reduce timed up and go to <11 seconds to reduce fall risk and demonstrate improved transfer/gait ability. Baseline: 13.11 sec  Goal status: INITIAL  5.  Patient will increase 10 meter walk test to >1.67m/s as to improve gait speed for better community ambulation and to reduce fall risk. Baseline: .58 m/s Goal status: INITIAL  6.  Patient will increase six minute walk test distance to >1000 for progression to community ambulator and improve gait ability Baseline: 554 ft with quad cane and CGA Goal status: INITIAL  ASSESSMENT:  CLINICAL IMPRESSION: Warmed up on Nustep today. He then performed various dynamic balance and reactive balance exercises to reduce risk of falls. Patient continues to show decreased dynamic balance with decreased reaction times to when reactive to pertubation in unexpected direction. Noted today that he has most difficulty keeping balance with LOB in backward direction. Gait training performed x600' with verb cues for increased stride length as dual task such as conversation resulted in decreased gait speed. Patient continues to be highly motivated and works hard to improve throughout POC. Decreased LE strength, decreased balance, and gait abnormalities still noted during today's treatment as patient is a great candidate to continue PT treatment at this time.   *Gait belt donned throughout today's treatment session for increased safety.    OBJECTIVE IMPAIRMENTS: Abnormal gait, decreased activity tolerance, decreased balance, decreased endurance, decreased  mobility, difficulty walking, decreased strength, and hypomobility.   ACTIVITY  LIMITATIONS: standing, squatting, stairs, transfers, and locomotion level  PARTICIPATION LIMITATIONS: shopping, community activity, and yard work  PERSONAL FACTORS: Age and 3+ comorbidities:  with history of left carotid artery stent, CAD status post 5 vessel CABG, dyslipidemia, hypertension, history of stereotypic spells are also affecting patient's functional outcome.   REHAB POTENTIAL: Good  CLINICAL DECISION MAKING: Evolving/moderate complexity  EVALUATION COMPLEXITY: Moderate  PLAN:  PT FREQUENCY: 2x/week  PT DURATION: 12 weeks  PLANNED INTERVENTIONS: 97750- Physical Performance Testing, 97110-Therapeutic exercises, 97530- Therapeutic activity, 97112- Neuromuscular re-education, 97535- Self Care, [id number]- Manual therapy, [phone]- Gait training, Patient/Family education, Balance training, and Stair training  PLAN FOR NEXT SESSION:  Continue working to improve leg strength, dynamic balance, gait mechanics, and overall endurance with progressive exercises.   1:14 PM, 10/05/24 Norman Sharps, PT, DPT Physical Therapist - [place]  [facility] [phone]

## 2024-10-06 ENCOUNTER — Ambulatory Visit: Admitting: Urology

## 2024-10-06 ENCOUNTER — Encounter: Payer: Self-pay | Admitting: Urology

## 2024-10-06 VITALS — BP 139/86 | HR 72 | Ht 72.0 in | Wt 190.0 lb

## 2024-10-06 DIAGNOSIS — N400 Enlarged prostate without lower urinary tract symptoms: Secondary | ICD-10-CM

## 2024-10-06 DIAGNOSIS — C61 Malignant neoplasm of prostate: Secondary | ICD-10-CM

## 2024-10-06 MED ORDER — TAMSULOSIN HCL 0.4 MG PO CAPS: 0.4000 mg | ORAL_CAPSULE | Freq: Every day | ORAL | 2 refills | Status: AC

## 2024-10-06 NOTE — Assessment & Plan Note (Signed)
 Mild progressive BPH LUTS Has not tried prior medical therapy  - Will start empiric Flomax  0.4 mg nightly.  Risk/benefits discussed, patient agreed

## 2024-10-09 ENCOUNTER — Ambulatory Visit

## 2024-10-10 LAB — OPHTHALMOLOGY REPORT-SCANNED

## 2024-10-11 ENCOUNTER — Ambulatory Visit

## 2024-10-11 DIAGNOSIS — R269 Unspecified abnormalities of gait and mobility: Secondary | ICD-10-CM

## 2024-10-11 DIAGNOSIS — M545 Low back pain, unspecified: Secondary | ICD-10-CM

## 2024-10-11 DIAGNOSIS — M25552 Pain in left hip: Secondary | ICD-10-CM

## 2024-10-11 DIAGNOSIS — M6281 Muscle weakness (generalized): Secondary | ICD-10-CM | POA: Diagnosis not present

## 2024-10-11 DIAGNOSIS — R262 Difficulty in walking, not elsewhere classified: Secondary | ICD-10-CM

## 2024-10-11 DIAGNOSIS — R2689 Other abnormalities of gait and mobility: Secondary | ICD-10-CM

## 2024-10-11 NOTE — Therapy (Signed)
 OUTPATIENT PHYSICAL THERAPY TREATMENT  Patient Name: Shannon Chung MRN: [id number] DOB:1941/05/07, 83 y.o., male Today's Date: 10/11/2024  PCP: Gretel App, NP  REFERRING PROVIDER: Gretel App, NP  END OF SESSION:  PT End of Session - 10/11/24 1405     Visit Number 14    Number of Visits 24    Date for Recertification  11/15/23    Authorization Type [employer] Medicare    Authorization Time Period 10/21-1/13/26 for 24 visits    Progress Note Due on Visit 20    PT Start Time 1359    Equipment Utilized During Treatment Gait belt    Activity Tolerance Patient tolerated treatment well;No increased pain    Behavior During Therapy [facility] for tasks assessed/performed            Past Medical History:  Diagnosis Date   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 06/2015 Cardiac CT: Ca score 1103 (84th %'ile);  b. 07/2015 Cath: LM 70, LAD 80p, 100/80m, D1 70, D2 95, RI 75, RCA 100p/m;  c. 07/2015 CABG x 5 (LIMA->LAD, VG->Diag, VG->OM1->OM2, VG->OM3).   COVID-19    12/2021   COVID-19 01/18/2022   COVID-19 vaccine administered 01/18/2022   Unknown how many vaccine doses have been received. Entered from Emergency Triage Note.   Diabetes mellitus without complication (HCC) 07/2015   Dyslipidemia    Essential hypertension    Essential hypertension 01/02/2015   Formatting of this note might be different from the original.  Last Assessment & Plan:   Chronic, stable. Continue current regimen.   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 dilated, EF 35% improved to 45-50% (2015);  b. 07/2015 EF 25-35% by LV gram.   Ischemic cardiomyopathy 01/02/2015   Kidney stones 04/17/2021   Lone atrial fibrillation (HCC) 1983   a. isolated episode, not on OAC.   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 06/2014: LV; resolved with coumadin-->no residual on f/u echo, no longer on coumadin.   Mural thrombus of heart 08/26/2021   Formatting of this note might be different from the original.  Jun 16, 2019 Entered By: VINIE ALLEAN AQUAS Comment: left ventricle, cardiac apex   Osteoarthritis    a. 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 Jun 16, 2019 Entered By: VINIE ALLEAN AQUAS Comment: leftAug 19, 2020 Entered By: VINIE ALLEAN AQUAS Comment: resolved by total left knee replacement Jun 16, 2019 Entered By: VINIE ALLEAN AQUAS Comment: leftAug 19, 2020 Entered By: VINIE ALLEAN AQUAS Comment: resolved by total left knee replacement   Trigger finger of left hand 07/07/2019   Vitamin D  deficiency    Past Surgical History:  Procedure Laterality Date   BICEPS TENDON REPAIR Right 1993   CARDIAC CATHETERIZATION N/A 07/05/2015   Procedure: Left Heart Cath and Coronary Angiography;  Surgeon: Evalene JINNY Lunger, MD;  Location: [facility] INVASIVE CV LAB;  Service: Cardiovascular;  Laterality: N/A;   CAROTID PTA/STENT INTERVENTION Left 04/26/2023   Procedure: CAROTID PTA/STENT INTERVENTION;  Surgeon: Marea Selinda RAMAN, MD;  Location: [facility] INVASIVE CV LAB;  Service: Cardiovascular;  Laterality: Left;   CATARACT EXTRACTION Left 12/2016   with keratoplasty   COLONOSCOPY  2007   COLONOSCOPY WITH PROPOFOL  N/A 12/02/2017   TA, SSA, rpt 3 yrs(Tahiliani, Varnita B, MD)   COLONOSCOPY WITH PROPOFOL  N/A 11/26/2020   Procedure: COLONOSCOPY WITH PROPOFOL ;  Surgeon: Jinny Carmine, MD;  Location: [facility] ENDOSCOPY;  Service: Endoscopy;  Laterality: N/A;   CORONARY ARTERY BYPASS GRAFT N/A 07/29/2015   Procedure: CORONARY ARTERY BYPASS GRAFTING (CABG) x 5 (LIMA to LAD, SVG to DIAGONAL,  SVG SEQUENTIALLY to OM1 and OM2, SVG to OM3) with Endoscopic Vein Havesting of  GREATER SAPHENOUS VEIN from RIGHT THIGH and partial LOWER LEG ;  Surgeon: Dorise MARLA Fellers, MD;  Location: [facility] OR;  Service: Open Heart Surgery;  Laterality: N/A;   EYE SURGERY     b/l cataract and cornea replaced    HAND SURGERY     left hand 1st/2nd trigger fingers Dr. Cleotilde ortho    JOINT REPLACEMENT      KNEE ARTHROSCOPY Left remote   MOHS SURGERY     left cheek scc 2022 Dr. Lloyd   MOHS SURGERY     x 5 facial scc   right biceps tendon     repair/re attachment    SKIN CANCER EXCISION  10/2015   BCC - L ala (pending MOHs) and L scapula (complete excision)   TEE WITHOUT CARDIOVERSION N/A 07/29/2015   Procedure: TRANSESOPHAGEAL ECHOCARDIOGRAM (TEE);  Surgeon: Dorise MARLA Fellers, MD;  Location: [facility] OR;  Service: Open Heart Surgery;  Laterality: N/A;   TONSILLECTOMY  1949   TOTAL KNEE ARTHROPLASTY Left 03/18/2016   cemented L TKR; Kayla Cleotilde, MD   Patient Active Problem List   Diagnosis Date Noted   Back pain 08/09/2024  Stroke-like symptoms 08/01/2024   Status post placement of implantable loop recorder 07/12/2024   Seizure disorder (HCC) 06/28/2024   Annual physical exam 07/10/2023   TIA (transient ischemic attack) 05/02/2023   GERD without esophagitis 05/02/2023   Carotid stenosis, symptomatic, with infarction (HCC) 04/26/2023   History of CVA (cerebrovascular accident) without residual deficits 03/04/2023   Hemiparesis affecting right side as late effect of cerebrovascular accident (CVA) (HCC) 02/22/2023   Chronic radicular pain of lower back 08/10/2022   Cervical spondylosis 03/11/2022   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   Bilateral carotid artery stenosis 03/11/2022   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   Lumbar spondylosis 10/07/2021   DDD (degenerative disc disease), lumbar 10/07/2021   History of radiation therapy 08/26/2021   Aortic atherosclerosis 04/17/2021   Diverticulosis 04/17/2021   Hypertension associated with diabetes (HCC) 10/07/2020   Overweight (BMI 25.0-29.9) 10/07/2020   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   Vitamin D  deficiency 08/01/2020   Polyp of sigmoid colon 08/01/2020   Insomnia 06/07/2020   Degenerative joint disease of hand 03/01/2020   BPH (benign prostatic hyperplasia) 08/05/2018   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   Macular scar  of both eyes 04/20/2018   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   Scotoma involving central area of both eyes 04/20/2018   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   Fatty liver 03/25/2018   Erectile dysfunction 02/23/2017   Hx of CABG 01/24/2017   Advanced care planning/counseling discussion 10/21/2015   Coronary artery disease of native artery of native heart with stable angina pectoris    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  Rationale for Evaluation and Treatment: Rehabilitation  SUBJECTIVE:                                                                                                                                                                                             SUBJECTIVE STATEMENT: Patient reports doing okay today, but he continues to not feel him safe as he had sinuses issues earlier this week. He reports he has not done much today.   PERTINENT HISTORY:  CVA and hospital admission on 08/01/24. Pt referred after CVA. Pt was unable to get inpatient rehab but was able to get with home health. Pt had to go through a marathon with nurses and PT for home health. Pt did end up in PT with a HHPT  he appreciated and decided he should come to outpatient services. Pt feels he is improving but is not at his pre morbid level. Pt using cane at the moment and uses transport chair to get to clinic.PMX of with history of left carotid artery stent, CAD status post 5 vessel CABG, dyslipidemia, hypertension, history of stereotypic spells.   PAIN:  Are you having pain? No  PRECAUTIONS: Fall  WEIGHT BEARING RESTRICTIONS: No  FALLS: Has patient fallen in last 6 months? No  LIVING  ENVIRONMENT: Lives with: lives with their spouse Lives in: House/apartment- Duplex Stairs: No Has following equipment at home: Single point cane and Walker - 2 wheeled  PLOF: Independent and Independent with basic ADLs  PATIENT GOALS: improve gait, improve R LE strength, improve balance and mobility.   OBJECTIVE:  Note: Objective measures were completed at Evaluation unless otherwise noted.  DIAGNOSTIC FINDINGS: 1. Scattered patchy small volume acute ischemic nonhemorrhagic left MCA distribution infarcts as above. No associated mass effect. 2. Underlying chronic left frontoparietal infarct, with additional small remote lacunar infarcts about the left greater than right basal ganglia. 3. Underlying age-related cerebral atrophy with mild chronic small vessel ischemic disease.  COGNITION: Overall cognitive status: Within functional limits for tasks assessed   SENSATION: WFL  LOWER EXTREMITY ROM:    WNL for tasks assessed   LOWER EXTREMITY MMT:    MMT Right Eval Left Eval  Hip flexion 4 4+  Hip extension    Hip abduction 4 4+  Hip adduction 4+ 4+  Hip internal rotation    Hip external rotation    Knee flexion 4 4+  Knee extension 4 4+  Ankle dorsiflexion 4 4+  Ankle plantarflexion    Ankle inversion    Ankle eversion    (Blank rows = not tested)  FUNCTIONAL TESTS:  5 times sit to stand: 21.23 sec Timed up and go (TUG): test visit 2  6 minute walk test: 554 ft with CGA  BERG 44 : .58 m/s  PATIENT SURVEYS:  SIS 16: 65.6 %                                                                                                                             TREATMENT DATE: 10/11/24   -Nustep LVL 3-6 6 min. Lower Extremity only.   -Gait x600' (1 large lap, 1 small lap) with CGA for safety and verb cues for mechanics.  -Romberg stance on Airex with EC 4x30''   -Tandem stance 2x30''   -Sit to stands with 5 kg medicine ball. 2x10   -Ball bounce while standing on  Airex with NBOS - 30x.   -Gait x600' with CGA for safety (improved compared to previous).   -Marching on Airex 2x10 each at balance station.   -Ball bounce while standing on Airex - 30x  -Ball catch with forward step x20 each.         PATIENT EDUCATION: Education details: Pt educated throughout session about  proper posture and technique with exercises. Improved exercise technique, movement at target joints, use of target muscles after min to mod verbal, visual, tactile cues. Person educated: Patient Education method: Explanation Education comprehension: verbalized understanding  HOME EXERCISE PROGRAM: Access Code: [email or web address] URL: [email or web address] Date: 10/02/2024 Prepared by: Norman Sharps  Exercises - Standing Hip Flexion with Resistance Loop  - 1 x daily - 7 x weekly - 2 sets - 10 reps - Hip Extension with Resistance Loop  - 1 x daily - 7 x weekly - 2 sets - 10 reps - Hip Abduction with Resistance Loop  - 1 x daily - 7 x weekly - 2 sets - 10 reps - Standing Marching  - 1 x daily - 7 x weekly - 2 sets - 10 reps --------------------------------------------------------------------------------   Access Code: [email or web address] URL: [email or web address] Date: 08/28/2024 Prepared by: Lonni Gainer  Exercises - Sit to Stand Without Arm Support  - 1 x daily - 7 x weekly - 3 sets - 10 reps - Mini Lunge with Counter Support   - 1 x daily - 7 x weekly - 2 sets - 10 reps - Side Stepping with Resistance at Ankles and Counter Support  - 1 x daily - 7 x weekly - 2 sets - 10 reps - Marching Near Counter  - 1 x daily - 7 x weekly - 2 sets - 10 reps  SHORT TERM GOALS: Target date: 09/19/2024 Patient will be independent in home exercise program to improve strength/mobility for better functional independence with ADLs. Baseline: No HEP currently  Goal status: INITIAL  LONG TERM GOALS: Target date: 11/14/2024 1.  Patient will complete five times sit to stand  test in < 15 seconds indicating an increased LE strength and improved balance. Baseline: 21.23 sec  Goal status: INITIAL  2.  Patient will improve SIS 16 score to 75 to demonstrate statistically significant improvement in mobility and quality of life as it relates to their CVA functional deficits.  Baseline: 65.6% Goal status: INITIAL   3.  Patient will increase Berg Balance score by > 6 points to demonstrate decreased fall risk during functional activities. Baseline: 44 Goal status: INITIAL   4.  Patient will reduce timed up and go to <11 seconds to reduce fall risk and demonstrate improved transfer/gait ability. Baseline: 13.11 sec  Goal status: INITIAL  5.  Patient will increase 10 meter walk test to >1.47m/s as to improve gait speed for better community ambulation and to reduce fall risk. Baseline: .58 m/s Goal status: INITIAL  6.  Patient will increase six minute walk test distance to >1000 for progression to community ambulator and improve gait ability Baseline: 554 ft with quad cane and CGA Goal status: INITIAL  ASSESSMENT:  CLINICAL IMPRESSION: Warmed up on Nustep today. He then ambulated 600' working to improve gait speed and stride length. Pt performed series of balance exercises to improve both dynamic balance and fall prevention strategies while standing on Airex pad. Sit to stands performed to improve transfers at home as well as improve cardiovasular response to exercise. He finished treatment with a 600' walk for cool down. Pt showed good improvement in gait mechanics with increased stride length, improved heel strike, and increased gait speed during second walk compared to first. Overall, patient continues to make good improvements with treatment and is motivated to improve. He does continue to have decreased balance along with intermittent lack of toe clearance with gait, decreased stride, length, and increased step width. Patient will benefit form  continuing PT treatment  at this time to improve on those deficits listed above.   *Gait belt donned throughout today's treatment session for increased safety.    OBJECTIVE IMPAIRMENTS: Abnormal gait, decreased activity tolerance, decreased balance, decreased endurance, decreased mobility, difficulty walking, decreased strength, and hypomobility.   ACTIVITY LIMITATIONS: standing, squatting, stairs, transfers, and locomotion level  PARTICIPATION LIMITATIONS: shopping, community activity, and yard work  PERSONAL FACTORS: Age and 3+ comorbidities:  with history of left carotid artery stent, CAD status post 5 vessel CABG, dyslipidemia, hypertension, history of stereotypic spells are also affecting patient's functional outcome.   REHAB POTENTIAL: Good  CLINICAL DECISION MAKING: Evolving/moderate complexity  EVALUATION COMPLEXITY: Moderate  PLAN:  PT FREQUENCY: 2x/week  PT DURATION: 12 weeks  PLANNED INTERVENTIONS: 97750- Physical Performance Testing, 97110-Therapeutic exercises, 97530- Therapeutic activity, 97112- Neuromuscular re-education, 97535- Self Care, [id number]- Manual therapy, [phone]- Gait training, Patient/Family education, Balance training, and Stair training  PLAN FOR NEXT SESSION:  Continue working to improve leg strength, dynamic balance, gait mechanics, and overall endurance with progressive exercises.   2:43 PM, 10/11/24 Norman Sharps, PT, DPT Physical Therapist - [place]  [facility] [phone]

## 2024-10-16 ENCOUNTER — Ambulatory Visit

## 2024-10-16 DIAGNOSIS — M6281 Muscle weakness (generalized): Secondary | ICD-10-CM | POA: Diagnosis not present

## 2024-10-16 DIAGNOSIS — G8929 Other chronic pain: Secondary | ICD-10-CM

## 2024-10-16 DIAGNOSIS — M25612 Stiffness of left shoulder, not elsewhere classified: Secondary | ICD-10-CM

## 2024-10-16 NOTE — Therapy (Unsigned)
 OUTPATIENT PHYSICAL THERAPY SHOULDER EVALUATION   Patient Name: Shannon Chung MRN: [id number] DOB:06-21-1941, 83 y.o., male Today's Date: 10/16/2024  END OF SESSION:  PT End of Session - 10/16/24 1331     Visit Number 1    Number of Visits 25    Date for Recertification  01/08/25    Authorization Type [employer] Medicare    Authorization Time Period --    Progress Note Due on Visit 10    PT Start Time 1331    PT Stop Time 1419    PT Time Calculation (min) 48 min    Equipment Utilized During Treatment Gait belt    Activity Tolerance Patient tolerated treatment well;No increased pain    Behavior During Therapy [facility] for tasks assessed/performed          Past Medical History:  Diagnosis Date   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 06/2015 Cardiac CT: Ca score 1103 (84th %'ile);  b. 07/2015 Cath: LM 70, LAD 80p, 100/54m, D1 70, D2 95, RI 75, RCA 100p/m;  c. 07/2015 CABG x 5 (LIMA->LAD, VG->Diag, VG->OM1->OM2, VG->OM3).   COVID-19    12/2021   COVID-19 01/18/2022   COVID-19 vaccine administered 01/18/2022   Unknown how many vaccine doses have been received. Entered from Emergency Triage Note.   Diabetes mellitus without complication (HCC) 07/2015   Dyslipidemia    Essential hypertension    Essential hypertension 01/02/2015   Formatting of this note might be different from the original.  Last Assessment & Plan:   Chronic, stable. Continue current regimen.   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 dilated, EF 35% improved to 45-50% (2015);  b. 07/2015 EF 25-35% by LV gram.   Ischemic cardiomyopathy 01/02/2015   Kidney stones 04/17/2021   Lone atrial fibrillation (HCC) 1983   a. isolated episode, not on OAC.   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 06/2014: LV; resolved with coumadin-->no residual on f/u echo, no longer on coumadin.   Mural thrombus of heart 08/26/2021   Formatting of this note might be different from  the original. Jun 16, 2019 Entered By: VINIE ALLEAN AQUAS Comment: left ventricle, cardiac apex   Osteoarthritis    a. 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 Jun 16, 2019 Entered By: VINIE ALLEAN AQUAS Comment: leftAug 19, 2020 Entered By: VINIE ALLEAN AQUAS Comment: resolved by total left knee replacement Jun 16, 2019 Entered By: VINIE ALLEAN AQUAS Comment: leftAug 19, 2020 Entered By: VINIE ALLEAN AQUAS Comment: resolved by total left knee replacement   Trigger finger of left hand 07/07/2019   Vitamin D  deficiency    Past Surgical History:  Procedure Laterality Date   BICEPS TENDON REPAIR Right 1993   CARDIAC CATHETERIZATION N/A 07/05/2015   Procedure: Left Heart Cath and Coronary Angiography;  Surgeon: Evalene JINNY Lunger, MD;  Location: [facility] INVASIVE CV LAB;  Service: Cardiovascular;  Laterality: N/A;   CAROTID PTA/STENT INTERVENTION Left 04/26/2023   Procedure: CAROTID PTA/STENT INTERVENTION;  Surgeon: Marea Selinda RAMAN, MD;  Location: [facility] INVASIVE CV LAB;  Service: Cardiovascular;  Laterality: Left;   CATARACT EXTRACTION Left 12/2016   with keratoplasty   COLONOSCOPY  2007   COLONOSCOPY WITH PROPOFOL  N/A 12/02/2017   TA, SSA, rpt 3 yrs(Tahiliani, Varnita B, MD)   COLONOSCOPY WITH PROPOFOL  N/A 11/26/2020   Procedure: COLONOSCOPY WITH PROPOFOL ;  Surgeon: Jinny Carmine, MD;  Location: [facility] ENDOSCOPY;  Service: Endoscopy;  Laterality: N/A;   CORONARY ARTERY BYPASS GRAFT N/A 07/29/2015   Procedure: CORONARY ARTERY BYPASS GRAFTING (CABG) x 5 (LIMA to LAD, SVG to DIAGONAL,  SVG SEQUENTIALLY to OM1 and OM2, SVG to OM3) with Endoscopic Vein Havesting of  GREATER SAPHENOUS VEIN from RIGHT THIGH and partial LOWER LEG ;  Surgeon: Dorise MARLA Fellers, MD;  Location: [facility] OR;  Service: Open Heart Surgery;  Laterality: N/A;   EYE SURGERY     b/l cataract and cornea replaced    HAND SURGERY     left hand 1st/2nd trigger fingers Dr. Cleotilde ortho    JOINT REPLACEMENT     KNEE ARTHROSCOPY Left remote    MOHS SURGERY     left cheek scc 2022 Dr. Lloyd   MOHS SURGERY     x 5 facial scc   right biceps tendon     repair/re attachment    SKIN CANCER EXCISION  10/2015   BCC - L ala (pending MOHs) and L scapula (complete excision)   TEE WITHOUT CARDIOVERSION N/A 07/29/2015   Procedure: TRANSESOPHAGEAL ECHOCARDIOGRAM (TEE);  Surgeon: Dorise MARLA Fellers, MD;  Location: [facility] OR;  Service: Open Heart Surgery;  Laterality: N/A;   TONSILLECTOMY  1949   TOTAL KNEE ARTHROPLASTY Left 03/18/2016   cemented L TKR; Kayla Cleotilde, MD   Patient Active Problem List   Diagnosis Date Noted   Back pain 08/09/2024  Stroke-like symptoms 08/01/2024   Status post placement of implantable loop recorder 07/12/2024   Seizure disorder (HCC) 06/28/2024   Annual physical exam 07/10/2023   TIA (transient ischemic attack) 05/02/2023   GERD without esophagitis 05/02/2023   Carotid stenosis, symptomatic, with infarction (HCC) 04/26/2023   History of CVA (cerebrovascular accident) without residual deficits 03/04/2023   Hemiparesis affecting right side as late effect of cerebrovascular accident (CVA) (HCC) 02/22/2023   Chronic radicular pain of lower back 08/10/2022   Cervical spondylosis 03/11/2022   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   Bilateral carotid artery stenosis 03/11/2022   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   Lumbar spondylosis 10/07/2021   DDD (degenerative disc disease), lumbar 10/07/2021   History of radiation therapy 08/26/2021   Aortic atherosclerosis 04/17/2021   Diverticulosis 04/17/2021   Hypertension associated with diabetes (HCC) 10/07/2020   Overweight (BMI 25.0-29.9) 10/07/2020   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   Vitamin D  deficiency 08/01/2020   Polyp of sigmoid colon 08/01/2020   Insomnia 06/07/2020   Degenerative joint disease of hand 03/01/2020   BPH (benign prostatic hyperplasia) 08/05/2018   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   Macular scar of both eyes 04/20/2018    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   Scotoma involving central area of both eyes 04/20/2018   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   Fatty liver 03/25/2018   Erectile dysfunction 02/23/2017   Hx of CABG 01/24/2017   Advanced care planning/counseling discussion 10/21/2015   Coronary artery disease of native artery of native heart with stable angina pectoris    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   Dyslipidemia 01/02/2015   Osteoarthritis 01/02/2015   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/02/2014    PCP: Gretel App, NP   REFERRING PROVIDER: Kathlynn Sharper, MD   M75.102,M12.812 (ICD-10-CM) - Left rotator cuff tear arthropathy   THERAPY DIAG:  Chronic left shoulder pain  Decreased ROM of left shoulder  Rationale for Evaluation and Treatment: Rehabilitation  ONSET DATE: 07/03/24  SUBJECTIVE:                                                                                                                                                                                      SUBJECTIVE STATEMENT:  Pt reports that he has been having increased shoulder pain since the beginning of September of this year.  Pt notes that it was a gradual occurrence and noted increased pain with horizontal adduction.  Pt decided to mention it to Dr. Kathlynn after he experienced some numbness in the L shoulder.  Pt notes the numbness was from the top of the shoulder to the elbow and after getting  up and moving, it went away.  Pt ultimately went and had imaging performed and the pt reports that the MD told him he did not have a ball at the humeral head any longer, that it had worn out and was gone.  MD stated it was likely an old injury that just continued to progress over the years, however the pt has no recollection of any injury, even with playing basketball and football.     Hand dominance: Right  PERTINENT HISTORY:  Per MD: He reports left shoulder  pain, described as a deep ache that occasionally radiates down his arm. 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 He has not had any shoulder surgeries but recalls possible injuries from playing football and other sports about twenty years ago. He notes muscle shrinkage and a weight loss of about twenty pounds over the last year or two.   His current medications include metformin  and Keppra , the latter prescribed following a stroke. He denies taking tramadol  or Ultram  for pain management. He has a history of a pacemaker on the left side, which is not related to his current rib pain.  PAIN:  Are you having pain? Yes: NPRS scale: 3/10 Pain location: posterior shoulder, deep within the joint. Pain description: dull pain Aggravating factors: horizontal adduction, abduction Relieving factors: rest  PRECAUTIONS: Fall  RED FLAGS: None   WEIGHT BEARING RESTRICTIONS: No  FALLS:  Has patient fallen in last 6 months? No  LIVING ENVIRONMENT: Lives with: lives with their spouse Lives in: House/apartment-duplex Stairs: No Has following equipment at home: Single point cane and Environmental Consultant - 2 wheeled  OCCUPATION: N/A - Retired: [employer]  PLOF: Independent  PATIENT GOALS: Improve pain/ache  NEXT MD VISIT:   OBJECTIVE:  Note: Objective measures were completed at Evaluation unless otherwise noted.  DIAGNOSTIC FINDINGS:    RADIOLOGY Left shoulder X-ray: Significant glenohumeral arthrosis, high riding humeral head, large spur at inferior glenoid and humeral head, scapular Y view shows very high riding humeral head, rotator cuff wear consistent with cuff arthropathy, significant erosion of anterior acromion and spurring of glenohumeral joint, no acute process (10/10/2024)  PATIENT SURVEYS:  Quick Dash:  QUICK DASH  Please rate your ability do the following activities in the last week by selecting the number below  the appropriate response.   Activities Rating  Open a tight or new jar.  4 = Severe difficulty  Do heavy household chores (e.g., wash walls, floors). 4 = Severe difficulty  Carry a shopping bag or briefcase 3 = Moderate difficulty  Wash your back. 5 = Unable  Use a knife to cut food. 4 = Severe difficulty  Recreational activities in which you take some force or impact through your arm, shoulder or hand (e.g., golf, hammering, tennis, etc.). 4 = Severe difficulty  During the past week, to what extent has your arm, shoulder or hand problem interfered with your normal social activities with family, friends, neighbors or groups?  4 = Quite a bit  During the past week, were you limited in your work or other regular daily activities as a result of your arm, shoulder or hand problem? 4 = Very limited  Rate the severity of the following symptoms in the last week: Arm, Shoulder, or hand pain. 3 = Moderate  Rate the severity of the following symptoms in the last week: Tingling (pins and needles) in your arm, shoulder or hand. 3 = Moderate  During the past week, how much difficulty have you had sleeping because of the pain in your arm, shoulder or hand?  3 = Moderate difficulty   (A QuickDASH score may not be calculated if there is greater than 1 missing item.)  Quick Dash Disability/Symptom Score: 68.2%  Minimally Clinically Important Difference (MCID): 15-20 points  Flavio, F. et al. (2013). Minimally clinically important difference of the disabilities of the arm, shoulder, and hand outcome measures (DASH) and its shortened version (Quick DASH). Journal of Orthopaedic & Sports Physical Therapy, 44(1), 30-39)   COGNITION: Overall cognitive status: Within functional limits for tasks assessed     SENSATION: WFL  POSTURE: Pt with adequate posture in sitting, however is wearing an abdominal brace for back support.  UPPER EXTREMITY ROM:   Active ROM Right eval Left eval  Shoulder  flexion 142 139  Shoulder extension    Shoulder abduction 156 111*  Shoulder adduction    Shoulder internal rotation    Shoulder external rotation    Elbow flexion    Elbow extension    Wrist flexion    Wrist extension    Wrist ulnar deviation    Wrist radial deviation    Wrist pronation    Wrist supination    (Blank rows = not tested)  UPPER EXTREMITY MMT:;  MMT Right eval Left eval  Shoulder flexion 4- 3+  Shoulder extension    Shoulder abduction 3+ 3+  Shoulder adduction    Shoulder internal rotation    Shoulder external rotation    Upper trapezius 5 5  Lower trapezius    Elbow flexion 4+ 4+  Elbow extension 4+ 4+  Wrist flexion    Wrist extension    Wrist ulnar deviation    Wrist radial deviation    Wrist pronation    Wrist supination    Grip strength (lbs) 71 65  (Blank rows = not tested)  SHOULDER SPECIAL TESTS: Impingement tests: Neer impingement test: negative, Hawkins/Kennedy impingement test: positive , and Painful arc test: negative  Rotator cuff assessment: Empty can test: positive , Full can test: positive , External rotation lag sign: negative, and Belly press test: positive    JOINT MOBILITY TESTING:  Pt with significant pain with   PALPATION:  ***                                                                                                                             TREATMENT DATE: 10/16/2024  ***   PATIENT EDUCATION: Education details: *** Person educated: {Person educated:25204} Education method: {Education Method:25205} Education comprehension: {Education Comprehension:25206}  HOME EXERCISE PROGRAM: ***  ASSESSMENT:  CLINICAL IMPRESSION: Patient is a *** y.o. *** who was seen today for physical therapy evaluation and treatment for ***.   OBJECTIVE IMPAIRMENTS: {opptimpairments:25111}.   ACTIVITY LIMITATIONS: {activitylimitations:27494}  PARTICIPATION LIMITATIONS: {participationrestrictions:25113}  PERSONAL FACTORS:  {Personal factors:25162} are also affecting patient's functional outcome.   REHAB POTENTIAL: {rehabpotential:25112}  CLINICAL DECISION MAKING: {  clinical decision making:25114}  EVALUATION COMPLEXITY: {Evaluation complexity:25115}   GOALS: Goals reviewed with patient? {yes/no:20286}  SHORT TERM GOALS: Target date: ***  *** Baseline: Goal status: INITIAL  2.  *** Baseline:  Goal status: INITIAL  3.  *** Baseline:  Goal status: INITIAL  4.  *** Baseline:  Goal status: INITIAL  5.  *** Baseline:  Goal status: INITIAL  6.  *** Baseline:  Goal status: INITIAL  LONG TERM GOALS: Target date: ***  *** Baseline:  Goal status: INITIAL  2.  *** Baseline:  Goal status: INITIAL  3.  *** Baseline:  Goal status: INITIAL  4.  *** Baseline:  Goal status: INITIAL  5.  *** Baseline:  Goal status: INITIAL  6.  *** Baseline:  Goal status: INITIAL  PLAN:  PT FREQUENCY: {rehab frequency:25116}  PT DURATION: {rehab duration:25117}  PLANNED INTERVENTIONS: {rehab planned interventions:25118::97110-Therapeutic exercises,97530- Therapeutic [phone]- Neuromuscular re-education,97535- Self Rjmz,02859- Manual therapy,Patient/Family education}  PLAN FOR NEXT SESSION: ***   Fonda Simpers, PT, DPT Physical Therapist - [facility]  10/16/2024, 5:05 PM

## 2024-10-18 ENCOUNTER — Ambulatory Visit

## 2024-10-18 DIAGNOSIS — M25612 Stiffness of left shoulder, not elsewhere classified: Secondary | ICD-10-CM

## 2024-10-18 DIAGNOSIS — M6281 Muscle weakness (generalized): Secondary | ICD-10-CM

## 2024-10-18 DIAGNOSIS — G8929 Other chronic pain: Secondary | ICD-10-CM

## 2024-10-18 NOTE — Therapy (Signed)
 OUTPATIENT PHYSICAL THERAPY L SHOULDER EVALUATION   Patient Name: Shannon Chung MRN: [id number] DOB:May 21, 1941, 83 y.o., male Today's Date: 10/18/2024  END OF SESSION:  PT End of Session - 10/18/24 1445     Visit Number 2    Number of Visits 25    Date for Recertification  01/08/25    Authorization Type [employer] Medicare    Progress Note Due on Visit 10    PT Start Time 1404    PT Stop Time 1445    PT Time Calculation (min) 41 min    Equipment Utilized During Treatment Gait belt    Activity Tolerance Patient tolerated treatment well;No increased pain    Behavior During Therapy [facility] for tasks assessed/performed           Past Medical History:  Diagnosis Date   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 06/2015 Cardiac CT: Ca score 1103 (84th %'ile);  b. 07/2015 Cath: LM 70, LAD 80p, 100/12m, D1 70, D2 95, RI 75, RCA 100p/m;  c. 07/2015 CABG x 5 (LIMA->LAD, VG->Diag, VG->OM1->OM2, VG->OM3).   COVID-19    12/2021   COVID-19 01/18/2022   COVID-19 vaccine administered 01/18/2022   Unknown how many vaccine doses have been received. Entered from Emergency Triage Note.   Diabetes mellitus without complication (HCC) 07/2015   Dyslipidemia    Essential hypertension    Essential hypertension 01/02/2015   Formatting of this note might be different from the original.  Last Assessment & Plan:   Chronic, stable. Continue current regimen.   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 dilated, EF 35% improved to 45-50% (2015);  b. 07/2015 EF 25-35% by LV gram.   Ischemic cardiomyopathy 01/02/2015   Kidney stones 04/17/2021   Lone atrial fibrillation (HCC) 1983   a. isolated episode, not on OAC.   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 06/2014: LV; resolved with coumadin-->no residual on f/u echo, no longer on coumadin.   Mural thrombus of heart 08/26/2021   Formatting of this note might be different from the original. Jun 16, 2019  Entered By: VINIE ALLEAN AQUAS Comment: left ventricle, cardiac apex   Osteoarthritis    a. 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 Jun 16, 2019 Entered By: VINIE ALLEAN AQUAS Comment: leftAug 19, 2020 Entered By: VINIE ALLEAN AQUAS Comment: resolved by total left knee replacement Jun 16, 2019 Entered By: VINIE ALLEAN AQUAS Comment: leftAug 19, 2020 Entered By: VINIE ALLEAN AQUAS Comment: resolved by total left knee replacement   Trigger finger of left hand 07/07/2019   Vitamin D  deficiency    Past Surgical History:  Procedure Laterality Date   BICEPS TENDON REPAIR Right 1993   CARDIAC CATHETERIZATION N/A 07/05/2015   Procedure: Left Heart Cath and Coronary Angiography;  Surgeon: Evalene JINNY Lunger, MD;  Location: [facility] INVASIVE CV LAB;  Service: Cardiovascular;  Laterality: N/A;   CAROTID PTA/STENT INTERVENTION Left 04/26/2023   Procedure: CAROTID PTA/STENT INTERVENTION;  Surgeon: Marea Selinda RAMAN, MD;  Location: [facility] INVASIVE CV LAB;  Service: Cardiovascular;  Laterality: Left;   CATARACT EXTRACTION Left 12/2016   with keratoplasty   COLONOSCOPY  2007   COLONOSCOPY WITH PROPOFOL  N/A 12/02/2017   TA, SSA, rpt 3 yrs(Tahiliani, Varnita B, MD)   COLONOSCOPY WITH PROPOFOL  N/A 11/26/2020   Procedure: COLONOSCOPY WITH PROPOFOL ;  Surgeon: Jinny Carmine, MD;  Location: [facility] ENDOSCOPY;  Service: Endoscopy;  Laterality: N/A;   CORONARY ARTERY BYPASS GRAFT N/A 07/29/2015   Procedure: CORONARY ARTERY BYPASS GRAFTING (CABG) x 5 (LIMA to LAD, SVG to DIAGONAL,  SVG SEQUENTIALLY to OM1 and OM2, SVG to OM3) with Endoscopic Vein Havesting of  GREATER SAPHENOUS VEIN from RIGHT THIGH and partial LOWER LEG ;  Surgeon: Dorise MARLA Fellers, MD;  Location: [facility] OR;  Service: Open Heart Surgery;  Laterality: N/A;   EYE SURGERY     b/l cataract and cornea replaced    HAND SURGERY     left hand 1st/2nd trigger fingers Dr. Cleotilde ortho    JOINT REPLACEMENT     KNEE ARTHROSCOPY Left remote   MOHS SURGERY      left cheek scc 2022 Dr. Lloyd   MOHS SURGERY     x 5 facial scc   right biceps tendon     repair/re attachment    SKIN CANCER EXCISION  10/2015   BCC - L ala (pending MOHs) and L scapula (complete excision)   TEE WITHOUT CARDIOVERSION N/A 07/29/2015   Procedure: TRANSESOPHAGEAL ECHOCARDIOGRAM (TEE);  Surgeon: Dorise MARLA Fellers, MD;  Location: [facility] OR;  Service: Open Heart Surgery;  Laterality: N/A;   TONSILLECTOMY  1949   TOTAL KNEE ARTHROPLASTY Left 03/18/2016   cemented L TKR; Kayla Cleotilde, MD   Patient Active Problem List   Diagnosis Date Noted   Back pain 08/09/2024   Stroke-like symptoms 08/01/2024  Status post placement of implantable loop recorder 07/12/2024   Seizure disorder (HCC) 06/28/2024   Annual physical exam 07/10/2023   TIA (transient ischemic attack) 05/02/2023   GERD without esophagitis 05/02/2023   Carotid stenosis, symptomatic, with infarction (HCC) 04/26/2023   History of CVA (cerebrovascular accident) without residual deficits 03/04/2023   Hemiparesis affecting right side as late effect of cerebrovascular accident (CVA) (HCC) 02/22/2023   Chronic radicular pain of lower back 08/10/2022   Cervical spondylosis 03/11/2022   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   Bilateral carotid artery stenosis 03/11/2022   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   Lumbar spondylosis 10/07/2021   DDD (degenerative disc disease), lumbar 10/07/2021   History of radiation therapy 08/26/2021   Aortic atherosclerosis 04/17/2021   Diverticulosis 04/17/2021   Hypertension associated with diabetes (HCC) 10/07/2020   Overweight (BMI 25.0-29.9) 10/07/2020   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   Vitamin D  deficiency 08/01/2020   Polyp of sigmoid colon 08/01/2020   Insomnia 06/07/2020   Degenerative joint disease of hand 03/01/2020   BPH (benign prostatic hyperplasia) 08/05/2018   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   Macular scar of both eyes 04/20/2018   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   Scotoma involving central area of both eyes 04/20/2018   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   Fatty liver 03/25/2018   Erectile dysfunction 02/23/2017   Hx of CABG 01/24/2017   Advanced care planning/counseling discussion 10/21/2015   Coronary artery disease of native artery of native heart with stable angina pectoris    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   Dyslipidemia 01/02/2015   Osteoarthritis 01/02/2015   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/02/2014    PCP: Gretel App, NP   REFERRING PROVIDER: Kathlynn Sharper, MD   M75.102,M12.812 (ICD-10-CM) - Left rotator cuff tear arthropathy   THERAPY DIAG:  Chronic left shoulder pain  Decreased ROM of left shoulder  Muscle weakness (generalized)  Rationale for Evaluation and Treatment: Rehabilitation  ONSET DATE: 07/03/24  SUBJECTIVE:                                                                                                                                                                                      SUBJECTIVE STATEMENT:  Pt reports having 4/10 pain in L shoulder upon arrival. He reports he has been performing new HEP at home and has been doing well with them.    Hand dominance: Right  PERTINENT HISTORY:  Per MD: He reports left shoulder pain, described as a deep ache that occasionally radiates down his arm. 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 He has not had any shoulder surgeries but recalls possible injuries from playing football and other sports about twenty years ago. He notes muscle shrinkage and a weight loss of about twenty pounds over the last year or two.   His current medications include metformin  and Keppra , the latter prescribed following a stroke. He denies taking tramadol  or Ultram  for pain management. He has a history of a pacemaker on the  left side, which is not related to his current rib pain.  PAIN:  Are you having pain? Yes: NPRS scale: 3/10 Pain location: posterior shoulder, deep within the joint. Pain description: dull pain Aggravating factors: horizontal adduction, abduction Relieving factors: rest  PRECAUTIONS: Fall  RED FLAGS: None   WEIGHT BEARING RESTRICTIONS: No  FALLS:  Has patient fallen in last 6 months? No  LIVING ENVIRONMENT: Lives with: lives with their spouse Lives in: House/apartment-duplex Stairs: No Has following equipment at home: Single point cane and Environmental Consultant - 2 wheeled  OCCUPATION: N/A - Retired: [employer]  PLOF: Independent  PATIENT GOALS: Improve pain/ache  NEXT MD VISIT:   OBJECTIVE:  Note: Objective measures were completed at Evaluation unless otherwise noted.  DIAGNOSTIC FINDINGS:    RADIOLOGY Left shoulder X-ray: Significant glenohumeral arthrosis, high riding humeral head, large spur at inferior glenoid and humeral head, scapular Y view shows very high riding humeral head, rotator cuff wear consistent with cuff arthropathy, significant erosion of anterior acromion and spurring of glenohumeral joint, no acute process (10/10/2024)  PATIENT SURVEYS:  Quick Dash:  QUICK DASH  Please rate your ability do the following activities in the last week by selecting the number below the appropriate response.   Activities Rating  Open a tight or new jar.  4 = Severe difficulty  Do heavy household chores (e.g., wash walls, floors). 4 = Severe difficulty  Carry a shopping bag or briefcase 3 = Moderate difficulty  Wash your back. 5 = Unable  Use a knife to cut food. 4 = Severe difficulty  Recreational activities in which you take some force or impact through your arm, shoulder or hand (e.g., golf, hammering, tennis, etc.). 4 = Severe difficulty  During the past week, to what extent has your arm, shoulder or hand problem interfered with your normal social activities with family,  friends, neighbors or groups?  4 = Quite a bit  During the past week, were you limited in your work or other regular daily activities as a result of your arm, shoulder or hand problem? 4 = Very limited  Rate the severity of the following symptoms in the last week: Arm, Shoulder, or hand pain. 3 = Moderate  Rate the severity of the following symptoms in the last week: Tingling (pins and needles) in your arm, shoulder or hand. 3 = Moderate  During the past week, how much difficulty have you had sleeping because of the pain in your arm, shoulder or hand?  3 = Moderate difficulty   (A QuickDASH score may not be calculated if there is greater than 1 missing item.)  Quick Dash Disability/Symptom Score: 68.2%  Minimally Clinically Important Difference (MCID): 15-20 points  Flavio, F. et al. (2013). Minimally clinically important difference of the disabilities of the arm, shoulder, and hand outcome measures (DASH) and its shortened version (Quick DASH). Journal of Orthopaedic & Sports Physical Therapy, 44(1), 30-39)   COGNITION: Overall cognitive status: Within functional limits for tasks assessed     SENSATION: WFL  POSTURE: Pt with adequate posture in sitting,  however is wearing an abdominal brace for back support.  UPPER EXTREMITY ROM:   Active ROM Right eval Left eval  Shoulder flexion 142 139  Shoulder abduction 156 111*  (Blank rows = not tested)  UPPER EXTREMITY MMT:;  MMT Right eval Left eval  Shoulder flexion 4- 3+  Shoulder abduction 3+ 3+  Upper trapezius 5 5  Elbow flexion 4+ 4+  Elbow extension 4+ 4+  Grip strength (lbs) 71 65  (Blank rows = not tested)  SHOULDER SPECIAL TESTS: Impingement tests: Neer impingement test: negative, Hawkins/Kennedy impingement test: positive , and Painful arc test: negative  Rotator cuff assessment: Empty can test: positive , Full can test: positive , External rotation lag sign: negative, and Belly press test: positive     JOINT MOBILITY TESTING:  Pt with significant pain with   PALPATION:  Pt with tenderness noted in the infraspinatus, subscapularis, and UT of the L shoulder.                                                                                                                             TREATMENT DATE: 10/18/2024    TherEx: To improve strength, endurance, mobility, and function of specific targeted muscle groups or improve joint range of motion or improve muscle flexibility  Wall slides: flexion: 20x with 5'' holds; Abduction: 15x with 5'' holds.   Scapular retractions with bilateral ER using YTB: 2x10.  Seated Rows: YTB 3x10   Supine ABCS: 2x Supine Wand flexion: 2x10 Supine punch plus: 2x10  Side-lying ER: 2x10  -HEP issued with new exercises performed today.     PATIENT EDUCATION: Education details: Pt educated on role of PT and services provided during current POC, along with prognosis and information about the clinic.  Pt also given verbal cuing for proper form regarding the isometric exercises.    Person educated: Patient Education method: Explanation, Demonstration, Tactile cues, Verbal cues, and Handouts Education comprehension: verbalized understanding, returned demonstration, verbal cues required, and tactile cues required  HOME EXERCISE PROGRAM: Access Code: [email or web address] URL: [email or web address] Date: 10/17/2024 Prepared by: Sidra Simpers  Exercises - Seated Scapular Retraction  - 1 x daily - 7 x weekly - 3 sets - 10 reps - Isometric Shoulder Flexion with Ball at Wall  - 1 x daily - 7 x weekly - 3 sets - 10 reps - 3 hold - Isometric Shoulder Extension with Ball at Wall  - 1 x daily - 7 x weekly - 3 sets - 10 reps - 3 hold - Isometric Shoulder Abduction with Ball at Wall  - 1 x daily - 7 x weekly - 3 sets - 10 reps - 3 hold - Isometric Shoulder Abduction with Ball - Arm Straight at Wall  - 1 x daily - 7 x weekly - 3 sets - 10  reps  ASSESSMENT:  CLINICAL IMPRESSION: Patient began today's treatment working on mobility with wall slides with slight end stretch into flexion/abduction. He then performed  various shoulder periscapular strengthening exercises including rows and scap. Retractions. ABCs in supine performed to improve L shoulder stability. Patient also performed supine wand flexion AAROM improved L shoulder strength and mobility. Overall, patient tolerated treatment very well and continues to be highly motivated to improve. He was noted to have mild pain during today's treatment session with decreased L shoulder ROM, strength, and stability.    OBJECTIVE IMPAIRMENTS: decreased endurance, decreased mobility, decreased ROM, decreased strength, hypomobility, impaired UE functional use, and pain.   ACTIVITY LIMITATIONS: carrying, lifting, bending, transfers, bathing, toileting, dressing, reach over head, and hygiene/grooming  PARTICIPATION LIMITATIONS: meal prep, cleaning, laundry, driving, shopping, community activity, and yard work  PERSONAL FACTORS: Age, Fitness, Past/current experiences, Time since onset of injury/illness/exacerbation, and 3+ comorbidities: anxiety, biceps tendinitis, CVA, DM2, HTN, Heart attack, Heart disease,Shoulder impingement are also affecting patient's functional outcome.   REHAB POTENTIAL: Good  CLINICAL DECISION MAKING: Evolving/moderate complexity  EVALUATION COMPLEXITY: Moderate   GOALS: Goals reviewed with patient? Yes  SHORT TERM GOALS: Target date: 01/08/2025  Pt will be independent with HEP in order to demonstrate increased ability to perform tasks related to occupation/hobbies. Baseline: Pt given HEP at evaluation today.   Goal status: INITIAL  LONG TERM GOALS: Target date: 01/08/2025  Pt will reduce overall pain level to 0/10 by utilizing a combination of stretching, strengthening exercises, and pain-reducing modalities in order to improve overall QoL. Baseline: 3/10  at rest Goal status: INITIAL  2.  Pt will decrease quick DASH score by at least 8% in order to demonstrate clinically significant reduction in disability.  Baseline: 68.2% Goal status: INITIAL  3.  Pt will increase strength of  by at least 1/2 MMT grade in order to demonstrate improvement in strength and function  Baseline: see MMT chart above Goal status: INITIAL  PLAN:  PT FREQUENCY: 2x/week  PT DURATION: 12 weeks  PLANNED INTERVENTIONS: 97750- Physical Performance Testing, 97110-Therapeutic exercises, 97530- Therapeutic activity, [id number]- Neuromuscular re-education, 97535- Self Care, [id number]- Manual therapy, [id number]- Electrical stimulation (manual), 20560 (1-2 muscles), 20561 (3+ muscles)- Dry Needling, Patient/Family education, Joint mobilization, Joint manipulation, and Vestibular training  PLAN FOR NEXT SESSION:   Continue working to improve L shoulder mobility and strength along with stability of L shoulder with progressive exercises to patient's tolerance level.    Fonda Simpers, PT, DPT Physical Therapist - [facility]  10/18/2024, 5:35 PM

## 2024-10-23 ENCOUNTER — Telehealth: Payer: Self-pay

## 2024-10-23 ENCOUNTER — Ambulatory Visit
Admission: RE | Admit: 2024-10-23 | Discharge: 2024-10-23 | Disposition: A | Discharge status: Home / Self Care | Source: Ambulatory Visit | Attending: Radiation Oncology | Admitting: Radiation Oncology

## 2024-10-23 ENCOUNTER — Ambulatory Visit

## 2024-10-23 ENCOUNTER — Encounter: Payer: Self-pay | Admitting: Radiation Oncology

## 2024-10-23 VITALS — BP 141/88 | HR 66 | Temp 97.6°F | Resp 15 | Ht 72.0 in | Wt 191.0 lb

## 2024-10-23 DIAGNOSIS — Z8673 Personal history of transient ischemic attack (TIA), and cerebral infarction without residual deficits: Secondary | ICD-10-CM | POA: Insufficient documentation

## 2024-10-23 DIAGNOSIS — E119 Type 2 diabetes mellitus without complications: Secondary | ICD-10-CM | POA: Insufficient documentation

## 2024-10-23 DIAGNOSIS — Z860101 Personal history of adenomatous and serrated colon polyps: Secondary | ICD-10-CM | POA: Diagnosis not present

## 2024-10-23 DIAGNOSIS — M1611 Unilateral primary osteoarthritis, right hip: Secondary | ICD-10-CM | POA: Insufficient documentation

## 2024-10-23 DIAGNOSIS — Z7982 Long term (current) use of aspirin: Secondary | ICD-10-CM | POA: Diagnosis not present

## 2024-10-23 DIAGNOSIS — I119 Hypertensive heart disease without heart failure: Secondary | ICD-10-CM | POA: Insufficient documentation

## 2024-10-23 DIAGNOSIS — Z8521 Personal history of malignant neoplasm of larynx: Secondary | ICD-10-CM | POA: Diagnosis not present

## 2024-10-23 DIAGNOSIS — I252 Old myocardial infarction: Secondary | ICD-10-CM | POA: Diagnosis not present

## 2024-10-23 DIAGNOSIS — I4891 Unspecified atrial fibrillation: Secondary | ICD-10-CM | POA: Diagnosis not present

## 2024-10-23 DIAGNOSIS — E785 Hyperlipidemia, unspecified: Secondary | ICD-10-CM | POA: Insufficient documentation

## 2024-10-23 DIAGNOSIS — M19042 Primary osteoarthritis, left hand: Secondary | ICD-10-CM | POA: Diagnosis not present

## 2024-10-23 DIAGNOSIS — C61 Malignant neoplasm of prostate: Secondary | ICD-10-CM | POA: Insufficient documentation

## 2024-10-23 DIAGNOSIS — Z8616 Personal history of COVID-19: Secondary | ICD-10-CM | POA: Diagnosis not present

## 2024-10-23 DIAGNOSIS — Z87891 Personal history of nicotine dependence: Secondary | ICD-10-CM | POA: Diagnosis not present

## 2024-10-23 DIAGNOSIS — Z86018 Personal history of other benign neoplasm: Secondary | ICD-10-CM | POA: Insufficient documentation

## 2024-10-23 DIAGNOSIS — Z923 Personal history of irradiation: Secondary | ICD-10-CM | POA: Diagnosis not present

## 2024-10-23 DIAGNOSIS — G8929 Other chronic pain: Secondary | ICD-10-CM

## 2024-10-23 DIAGNOSIS — Z7902 Long term (current) use of antithrombotics/antiplatelets: Secondary | ICD-10-CM | POA: Diagnosis not present

## 2024-10-23 DIAGNOSIS — I519 Heart disease, unspecified: Secondary | ICD-10-CM | POA: Diagnosis not present

## 2024-10-23 DIAGNOSIS — Z85828 Personal history of other malignant neoplasm of skin: Secondary | ICD-10-CM | POA: Insufficient documentation

## 2024-10-23 DIAGNOSIS — M25612 Stiffness of left shoulder, not elsewhere classified: Secondary | ICD-10-CM

## 2024-10-23 DIAGNOSIS — Z79899 Other long term (current) drug therapy: Secondary | ICD-10-CM | POA: Diagnosis not present

## 2024-10-23 DIAGNOSIS — Z87442 Personal history of urinary calculi: Secondary | ICD-10-CM | POA: Diagnosis not present

## 2024-10-23 DIAGNOSIS — M19011 Primary osteoarthritis, right shoulder: Secondary | ICD-10-CM | POA: Insufficient documentation

## 2024-10-23 DIAGNOSIS — Z7984 Long term (current) use of oral hypoglycemic drugs: Secondary | ICD-10-CM | POA: Insufficient documentation

## 2024-10-23 DIAGNOSIS — I2510 Atherosclerotic heart disease of native coronary artery without angina pectoris: Secondary | ICD-10-CM | POA: Insufficient documentation

## 2024-10-23 DIAGNOSIS — M6281 Muscle weakness (generalized): Secondary | ICD-10-CM | POA: Diagnosis not present

## 2024-10-23 DIAGNOSIS — Z809 Family history of malignant neoplasm, unspecified: Secondary | ICD-10-CM | POA: Insufficient documentation

## 2024-10-23 DIAGNOSIS — I255 Ischemic cardiomyopathy: Secondary | ICD-10-CM | POA: Diagnosis not present

## 2024-10-23 NOTE — Consult Note (Signed)
 " NEW PATIENT EVALUATION  Name: Shannon Chung  MRN: [id number]  Date:   10/23/2024     DOB: [birth year]   This 83 y.o. 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   PREVIOUS INVESTIGATIONS:  Yeah PSMA PET scan reviewed Clinical notes reviewed Pathology reports reviewed  HPI: Patient is an 83 year old male originally consulted back over 3 years prior for stage IIa (T1c N0 M0) adenocarcinoma prostate mostly Gleason 7 (3+4).  At that time he requested continued surveillance although my recommendation was for IMRT radiation therapy.  His original PSA 3 years prior was in the 4.8 range has now gone up to 9.3.  PSMA PET scan back in November 2024 showed focal uptake in the left lobe of the prostate gland consistent with primary prostate adenocarcinoma.  He also had some radiotracer tracer activity within the right acetabulum not thought to be metastatic disease.  This was also reviewed at [facility] and again agreed that this was not consistent with metastatic disease to his acetabulum.  He also had an MRI scan of his left hip back in May 2025 which was compatible with interval healing of a nondisplaced fracture of the right superior acetabulum and right pubic root.  No definitive evidence of metastatic disease was noted.  Patient remains fairly asymptomatic specifically denies any increased lower urinary tract symptoms diarrhea fatigue or bone pain.  He is to start discussed with Dr. Donette going ahead with treatment at this time.  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), Coronary artery disease, COVID-19, COVID-19 (01/18/2022), COVID-19 vaccine administered (01/18/2022), Diabetes mellitus without complication (HCC) (07/2015), Dyslipidemia, Essential hypertension, Essential hypertension (01/02/2015), 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), Frequent PVCs (02/14/2018), Fuchs' corneal dystrophy (2016), Fuchs' corneal dystrophy, GERD (gastroesophageal reflux disease), Grief (10/07/2020), Health maintenance examination (02/23/2017), Heart attack (HCC), Heart disease, 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), History of radiation exposure, History of radiation exposure, History of tonsillectomy (08/26/2021), Impingement syndrome of right shoulder (10/2015), Impingement syndrome of shoulder region (05/08/2015), Ischemic cardiomyopathy, Ischemic cardiomyopathy (01/02/2015), Kidney stones (04/17/2021), Lone atrial fibrillation (HCC) (1983), Malignant neoplasm of prostate (HCC) (10/07/2021), Medicare annual wellness visit, subsequent (10/21/2015), Mural thrombus of cardiac apex, Mural thrombus of heart (08/26/2021), Osteoarthritis, Personal history of colonic polyps, Polyp of colon, Prostate cancer (HCC) (03/04/2022), PSA elevation (03/25/2018), Retention cyst of paranasal sinus (03/11/2022), Shoulder pain (12/23/2022), Skin cancer, 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    PAST SURGICAL HISTORY:  Past Surgical History:  Procedure Laterality Date   BICEPS TENDON  REPAIR Right 1993   CARDIAC CATHETERIZATION N/A 07/05/2015   Procedure: Left Heart Cath and Coronary Angiography;  Surgeon: Evalene JINNY Lunger, MD;  Location: [facility] INVASIVE CV LAB;  Service: Cardiovascular;  Laterality: N/A;  CAROTID PTA/STENT INTERVENTION Left 04/26/2023   Procedure: CAROTID PTA/STENT INTERVENTION;  Surgeon: Marea Selinda RAMAN, MD;  Location: [facility] INVASIVE CV LAB;  Service: Cardiovascular;  Laterality: Left;   CATARACT EXTRACTION Left 12/2016   with keratoplasty   COLONOSCOPY  2007   COLONOSCOPY WITH PROPOFOL  N/A 12/02/2017   TA, SSA, rpt 3 yrs(Tahiliani, Varnita B, MD)   COLONOSCOPY WITH PROPOFOL  N/A 11/26/2020   Procedure: COLONOSCOPY WITH PROPOFOL ;  Surgeon: Jinny Carmine, MD;  Location: [facility] ENDOSCOPY;  Service: Endoscopy;  Laterality: N/A;   CORONARY ARTERY BYPASS GRAFT N/A 07/29/2015   Procedure: CORONARY ARTERY BYPASS GRAFTING (CABG) x 5 (LIMA to LAD, SVG to DIAGONAL,  SVG SEQUENTIALLY to OM1 and OM2, SVG to OM3) with Endoscopic Vein Havesting of  GREATER SAPHENOUS VEIN from RIGHT THIGH and partial LOWER LEG ;  Surgeon: Dorise MARLA Fellers, MD;  Location: [facility] OR;  Service: Open Heart Surgery;  Laterality: N/A;   EYE SURGERY     b/l cataract and cornea replaced    HAND SURGERY     left hand 1st/2nd trigger fingers Dr. Cleotilde ortho    JOINT REPLACEMENT     KNEE ARTHROSCOPY Left remote   MOHS SURGERY     left cheek scc 2022 Dr. Lloyd   MOHS SURGERY     x 5 facial scc   right biceps tendon     repair/re attachment    SKIN CANCER EXCISION  10/2015   BCC - L ala (pending MOHs) and L scapula (complete excision)   TEE WITHOUT CARDIOVERSION N/A 07/29/2015   Procedure: TRANSESOPHAGEAL ECHOCARDIOGRAM (TEE);  Surgeon: Dorise MARLA Fellers, MD;  Location[facility] OR;  Service: Open Heart Surgery;  Laterality: N/A;   TONSILLECTOMY  1949   TOTAL KNEE ARTHROPLASTY Left 03/18/2016   cemented L TKR; Kayla Cleotilde, MD    FAMILY HISTORY: family history includes Alcohol abuse in his father; Alcoholism  in his father; CAD (age of onset: 29) in his father; Cancer in his daughter; Diabetes in his father; Heart disease in his father; Hyperlipidemia in his father; Hypertension in his father.  SOCIAL HISTORY:  reports that he quit smoking about 46 years ago. His smoking use included cigarettes. He started smoking about 56 years ago. He has a 5 pack-year smoking history. He has never used smokeless tobacco. He reports that he does not currently use alcohol after a past usage of about 4.0 standard drinks of alcohol per week. He reports that he does not use drugs.  ALLERGIES: Pollen extract  MEDICATIONS:  Current Outpatient Medications  Medication Sig Dispense Refill   acetaminophen  (TYLENOL ) 500 MG tablet Take 500 mg by mouth every 6 (six) hours as needed for mild pain.      aspirin  81 MG EC tablet Take 162 mg by mouth daily. Swallow whole.     atorvastatin  (LIPITOR ) 40 MG tablet TAKE 1 TABLET BY MOUTH DAILY 90 tablet 3   Blood Pressure KIT Check blood pressure two to three times a week 1 kit 0   Cholecalciferol  (D 1000) 25 MCG (1000 UT) capsule Take 1,000 Units by mouth.     clopidogrel  (PLAVIX ) 75 MG tablet Take 1 tablet (75 mg total) by mouth daily. 30 tablet 0   cyanocobalamin  (VITAMIN B12) 1000 MCG tablet Take 1,000 mcg by mouth.     donepezil (ARICEPT) 5 MG tablet Take 5 mg by mouth at bedtime.     ezetimibe  (ZETIA ) 10 MG tablet TAKE 1 TABLET BY MOUTH DAILY 90 tablet 3   famotidine  (  PEPCID ) 20 MG tablet TAKE 1 TABLET BY MOUTH 2 TIMES DAILY AS NEEDED FOR HEARTBURN/INDIGESTION. D/C ZANTAC  180 tablet 3   ketoconazole (NIZORAL) 2 % cream Apply topically.     levETIRAcetam  (KEPPRA ) 500 MG tablet Take 1 tablet (500 mg total) by mouth 2 (two) times daily. 180 tablet 4   metFORMIN  (GLUCOPHAGE ) 500 MG tablet Take 1 tablet (500 mg total) by mouth 2 (two) times daily with a meal. 180 tablet 3   metoprolol  succinate (TOPROL -XL) 25 MG 24 hr tablet Take 25 mg by mouth daily.     sildenafil  (REVATIO ) 20 MG  tablet Take 1 tablet (20 mg total) by mouth daily as needed. 30 tablet 0   tamsulosin  (FLOMAX ) 0.4 MG CAPS capsule Take 1 capsule (0.4 mg total) by mouth daily. 90 capsule 2   No current facility-administered medications for this encounter.    ECOG PERFORMANCE STATUS:  0 - Asymptomatic  REVIEW OF SYSTEMS: Patient denies any weight loss, fatigue, weakness, fever, chills or night sweats. Patient denies any loss of vision, blurred vision. Patient denies any ringing  of the ears or hearing loss. No irregular heartbeat. Patient denies heart murmur or history of fainting. Patient denies any chest pain or pain radiating to her upper extremities. Patient denies any shortness of breath, difficulty breathing at night, cough or hemoptysis. Patient denies any swelling in the lower legs. Patient denies any nausea vomiting, vomiting of blood, or coffee ground material in the vomitus. Patient denies any stomach pain. Patient states has had normal bowel movements no significant constipation or diarrhea. Patient denies any dysuria, hematuria or significant nocturia. Patient denies any problems walking, swelling in the joints or loss of balance. Patient denies any skin changes, loss of hair or loss of weight. Patient denies any excessive worrying or anxiety or significant depression. Patient denies any problems with insomnia. Patient denies excessive thirst, polyuria, polydipsia. Patient denies any swollen glands, patient denies easy bruising or easy bleeding. Patient denies any recent infections, allergies or URI. Patient s visual fields have not changed significantly in recent time.   PHYSICAL EXAM: BP (!) 141/88   Pulse 66   Temp 97.6 F (36.4 C) (Tympanic)   Resp 15   Ht 6' (1.829 m)   Wt 191 lb (86.6 kg)   BMI 25.90 kg/m  Well-developed well-nourished patient in NAD. HEENT reveals PERLA, EOMI, discs not visualized.  Oral cavity is clear. No oral mucosal lesions are identified. Neck is clear without evidence  of cervical or supraclavicular adenopathy. Lungs are clear to A&P. Cardiac examination is essentially unremarkable with regular rate and rhythm without murmur rub or thrill. Abdomen is benign with no organomegaly or masses noted. Motor sensory and DTR levels are equal and symmetric in the upper and lower extremities. Cranial nerves II through XII are grossly intact. Proprioception is intact. No peripheral adenopathy or edema is identified. No motor or sensory levels are noted. Crude visual fields are within normal range.  LABORATORY DATA: Pathology reports reviewed    RADIOLOGY RESULTS: MRI of right hip CT scan of pelvis and as well as PSMA PET scan all reviewed compatible with above-stated findings   IMPRESSION: Probable stage IIb Gleason 7 (3+4) adenocarcinoma the prostate in 83 year old male with rising PSA  PLAN: At this time I have recommended image guided IMRT radiation therapy to his prostate.  I still believe based on the [facility] he has a low risk of pelvic lymph node involvement.  We have discussed possibility of MRI scan  and repeat biopsy although the patient is not in favor of any more biopsies at this time if it could be avoided.  I have recommended 80 Gray over 8 weeks of image guided radiation therapy risks and benefits of treatment occluding increased urinary tract symptoms diarrhea fatigue alteration of blood counts all were reviewed with the patient in detail.  I have asked Dr. Perla to place fiducial markers for daily image guided treatment as well as start the patient on 6 months of Eligard depot.  Patient has questions about proton versus photon beam therapy.  I discussed that there is no study that shows any difference in disease-free survival overall survival or if side effect profile between the 2 modalities and he certainly would have to travel for those treatments.  Patient comprehends my recommendations well.  I am sending back to [facility] for marker placement as  well as Eligard.  Patient comprehends my recommendations well.  Marcey Penton, MD         "

## 2024-10-23 NOTE — Therapy (Signed)
 " OUTPATIENT PHYSICAL THERAPY L SHOULDER TREATMENT   Patient Name: Shannon Chung MRN: [id number] DOB:Dec 02, 1940, 83 y.o., male Today's Date: 10/24/2024  END OF SESSION:  PT End of Session - 10/23/24 1456     Visit Number 3    Number of Visits 25    Date for Recertification  01/08/25    Authorization Type [employer] Medicare    Progress Note Due on Visit 10    PT Start Time 1448    PT Stop Time 1528    PT Time Calculation (min) 40 min    Equipment Utilized During Treatment Gait belt    Activity Tolerance Patient tolerated treatment well;No increased pain    Behavior During Therapy [facility] for tasks assessed/performed           Past Medical History:  Diagnosis Date   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 06/2015 Cardiac CT: Ca score 1103 (84th %'ile);  b. 07/2015 Cath: LM 70, LAD 80p, 100/64m, D1 70, D2 95, RI 75, RCA 100p/m;  c. 07/2015 CABG x 5 (LIMA->LAD, VG->Diag, VG->OM1->OM2, VG->OM3).   COVID-19    12/2021   COVID-19 01/18/2022   COVID-19 vaccine administered 01/18/2022   Unknown how many vaccine doses have been received. Entered from Emergency Triage Note.   Diabetes mellitus without complication (HCC) 07/2015   Dyslipidemia    Essential hypertension    Essential hypertension 01/02/2015   Formatting of this note might be different from the original.  Last Assessment & Plan:   Chronic, stable. Continue current regimen.   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 dilated, EF 35% improved to 45-50% (2015);  b. 07/2015 EF 25-35% by LV gram.   Ischemic cardiomyopathy 01/02/2015   Kidney stones 04/17/2021   Lone atrial fibrillation (HCC) 1983   a. isolated episode, not on OAC.   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 06/2014: LV; resolved with coumadin-->no residual on f/u echo, no longer on coumadin.   Mural thrombus of heart 08/26/2021   Formatting of this note might be different from the original. Jun 16, 2019 Entered By: VINIE ALLEAN AQUAS Comment: left ventricle, cardiac apex   Osteoarthritis    a. 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 Jun 16, 2019 Entered By: VINIE ALLEAN AQUAS Comment: leftAug 19, 2020 Entered By: VINIE ALLEAN AQUAS Comment: resolved by total left knee replacement Jun 16, 2019 Entered By: VINIE ALLEAN AQUAS Comment: leftAug 19, 2020 Entered By: VINIE ALLEAN AQUAS Comment: resolved by total left knee replacement   Trigger finger of left hand 07/07/2019   Vitamin D  deficiency    Past Surgical History:  Procedure Laterality Date   BICEPS TENDON REPAIR Right 1993   CARDIAC CATHETERIZATION N/A 07/05/2015   Procedure: Left Heart Cath and Coronary Angiography;  Surgeon: Evalene JINNY Lunger, MD;  Location: [facility] INVASIVE CV LAB;  Service: Cardiovascular;  Laterality: N/A;   CAROTID PTA/STENT INTERVENTION Left 04/26/2023   Procedure: CAROTID PTA/STENT INTERVENTION;  Surgeon: Marea Selinda RAMAN, MD;  Location: [facility] INVASIVE CV LAB;  Service: Cardiovascular;  Laterality: Left;   CATARACT EXTRACTION Left 12/2016   with keratoplasty   COLONOSCOPY  2007   COLONOSCOPY WITH PROPOFOL  N/A 12/02/2017   TA, SSA, rpt 3 yrs(Tahiliani, Varnita B, MD)   COLONOSCOPY WITH PROPOFOL  N/A 11/26/2020   Procedure: COLONOSCOPY WITH PROPOFOL ;  Surgeon: Jinny Carmine, MD;  Location: [facility] ENDOSCOPY;  Service: Endoscopy;  Laterality: N/A;   CORONARY ARTERY BYPASS GRAFT N/A 07/29/2015   Procedure: CORONARY ARTERY BYPASS GRAFTING (CABG) x 5 (LIMA to LAD, SVG to DIAGONAL,  SVG SEQUENTIALLY to OM1 and OM2, SVG to OM3) with Endoscopic Vein Havesting of  GREATER SAPHENOUS VEIN from RIGHT THIGH and partial LOWER LEG ;  Surgeon: Dorise MARLA Fellers, MD;  Location: [facility] OR;  Service: Open Heart Surgery;  Laterality: N/A;   EYE SURGERY     b/l cataract and cornea replaced    HAND SURGERY     left hand 1st/2nd trigger fingers Dr. Cleotilde ortho    JOINT REPLACEMENT     KNEE ARTHROSCOPY Left remote   MOHS SURGERY      left cheek scc 2022 Dr. Lloyd   MOHS SURGERY     x 5 facial scc   right biceps tendon     repair/re attachment    SKIN CANCER EXCISION  10/2015   BCC - L ala (pending MOHs) and L scapula (complete excision)   TEE WITHOUT CARDIOVERSION N/A 07/29/2015   Procedure: TRANSESOPHAGEAL ECHOCARDIOGRAM (TEE);  Surgeon: Dorise MARLA Fellers, MD;  Location: [facility] OR;  Service: Open Heart Surgery;  Laterality: N/A;   TONSILLECTOMY  1949   TOTAL KNEE ARTHROPLASTY Left 03/18/2016   cemented L TKR; Kayla Cleotilde, MD   Patient Active Problem List   Diagnosis Date Noted   Back pain 08/09/2024   Stroke-like symptoms  08/01/2024   Status post placement of implantable loop recorder 07/12/2024   Seizure disorder (HCC) 06/28/2024   Annual physical exam 07/10/2023   TIA (transient ischemic attack) 05/02/2023   GERD without esophagitis 05/02/2023   Carotid stenosis, symptomatic, with infarction (HCC) 04/26/2023   History of CVA (cerebrovascular accident) without residual deficits 03/04/2023   Hemiparesis affecting right side as late effect of cerebrovascular accident (CVA) (HCC) 02/22/2023   Chronic radicular pain of lower back 08/10/2022   Cervical spondylosis 03/11/2022   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   Bilateral carotid artery stenosis 03/11/2022   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   Lumbar spondylosis 10/07/2021   DDD (degenerative disc disease), lumbar 10/07/2021   History of radiation therapy 08/26/2021   Aortic atherosclerosis 04/17/2021   Diverticulosis 04/17/2021   Hypertension associated with diabetes (HCC) 10/07/2020   Overweight (BMI 25.0-29.9) 10/07/2020   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   Vitamin D  deficiency 08/01/2020   Polyp of sigmoid colon 08/01/2020   Insomnia 06/07/2020   Degenerative joint disease of hand 03/01/2020   BPH (benign prostatic hyperplasia) 08/05/2018   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   Macular scar of both eyes 04/20/2018   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   Scotoma involving central area of both eyes 04/20/2018   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   Fatty liver 03/25/2018   Erectile dysfunction 02/23/2017   Hx of CABG 01/24/2017   Advanced care planning/counseling discussion 10/21/2015   Coronary artery disease of native artery of native heart with stable angina pectoris    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   Dyslipidemia 01/02/2015   Osteoarthritis 01/02/2015   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/02/2014    PCP: Gretel App, NP   REFERRING PROVIDER: Kathlynn Sharper, MD   M75.102,M12.812 (ICD-10-CM) - Left rotator cuff tear arthropathy   THERAPY DIAG:  Chronic left shoulder pain  Decreased ROM of left shoulder  Rationale for Evaluation and Treatment: Rehabilitation  ONSET DATE: 07/03/24  SUBJECTIVE:                                                                                                                                                                                      SUBJECTIVE STATEMENT:  Pt reports having min L shoulder soreness upon arrival. He continues to endorse compliance with HEP and states he can feel the benefit.     Hand dominance: Right  PERTINENT HISTORY:  Per MD: He reports left shoulder pain, described as a deep ache that occasionally radiates down his arm. 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 He  has not had any shoulder surgeries but recalls possible injuries from playing football and other sports about twenty years ago. He notes muscle shrinkage and a weight loss of about twenty pounds over the last year or two.   His current medications include metformin  and Keppra , the latter prescribed following a stroke. He denies taking tramadol  or Ultram  for pain management. He has a history of a pacemaker on the left side, which is not related to  his current rib pain.  PAIN:  Are you having pain? Yes: NPRS scale: 3/10 Pain location: posterior shoulder, deep within the joint. Pain description: dull pain Aggravating factors: horizontal adduction, abduction Relieving factors: rest  PRECAUTIONS: Fall  RED FLAGS: None   WEIGHT BEARING RESTRICTIONS: No  FALLS:  Has patient fallen in last 6 months? No  LIVING ENVIRONMENT: Lives with: lives with their spouse Lives in: House/apartment-duplex Stairs: No Has following equipment at home: Single point cane and Environmental Consultant - 2 wheeled  OCCUPATION: N/A - Retired: [employer]  PLOF: Independent  PATIENT GOALS: Improve pain/ache  NEXT MD VISIT:   OBJECTIVE:  Note: Objective measures were completed at Evaluation unless otherwise noted.  DIAGNOSTIC FINDINGS:    RADIOLOGY Left shoulder X-ray: Significant glenohumeral arthrosis, high riding humeral head, large spur at inferior glenoid and humeral head, scapular Y view shows very high riding humeral head, rotator cuff wear consistent with cuff arthropathy, significant erosion of anterior acromion and spurring of glenohumeral joint, no acute process (10/10/2024)  PATIENT SURVEYS:  Quick Dash:  QUICK DASH  Please rate your ability do the following activities in the last week by selecting the number below the appropriate response.   Activities Rating  Open a tight or new jar.  4 = Severe difficulty  Do heavy household chores (e.g., wash walls, floors). 4 = Severe difficulty  Carry a shopping bag or briefcase 3 = Moderate difficulty  Wash your back. 5 = Unable  Use a knife to cut food. 4 = Severe difficulty  Recreational activities in which you take some force or impact through your arm, shoulder or hand (e.g., golf, hammering, tennis, etc.). 4 = Severe difficulty  During the past week, to what extent has your arm, shoulder or hand problem interfered with your normal social activities with family, friends, neighbors or groups?  4 = Quite  a bit  During the past week, were you limited in your work or other regular daily activities as a result of your arm, shoulder or hand problem? 4 = Very limited  Rate the severity of the following symptoms in the last week: Arm, Shoulder, or hand pain. 3 = Moderate  Rate the severity of the following symptoms in the last week: Tingling (pins and needles) in your arm, shoulder or hand. 3 = Moderate  During the past week, how much difficulty have you had sleeping because of the pain in your arm, shoulder or hand?  3 = Moderate difficulty   (A QuickDASH score may not be calculated if there is greater than 1 missing item.)  Quick Dash Disability/Symptom Score: 68.2%  Minimally Clinically Important Difference (MCID): 15-20 points  Flavio, F. et al. (2013). Minimally clinically important difference of the disabilities of the arm, shoulder, and hand outcome measures (DASH) and its shortened version (Quick DASH). Journal of Orthopaedic & Sports Physical Therapy, 44(1), 30-39)   COGNITION: Overall cognitive status: Within functional limits for tasks assessed     SENSATION: WFL  POSTURE: Pt with adequate posture in sitting, however is wearing an  abdominal brace for back support.  UPPER EXTREMITY ROM:   Active ROM Right eval Left eval  Shoulder flexion 142 139  Shoulder abduction 156 111*  (Blank rows = not tested)  UPPER EXTREMITY MMT:;  MMT Right eval Left eval  Shoulder flexion 4- 3+  Shoulder abduction 3+ 3+  Upper trapezius 5 5  Elbow flexion 4+ 4+  Elbow extension 4+ 4+  Grip strength (lbs) 71 65  (Blank rows = not tested)  SHOULDER SPECIAL TESTS: Impingement tests: Neer impingement test: negative, Hawkins/Kennedy impingement test: positive , and Painful arc test: negative  Rotator cuff assessment: Empty can test: positive , Full can test: positive , External rotation lag sign: negative, and Belly press test: positive    JOINT MOBILITY TESTING:  Pt with  significant pain with   PALPATION:  Pt with tenderness noted in the infraspinatus, subscapularis, and UT of the L shoulder.                                                                                                                             TREATMENT DATE: 10/24/2024    TherEx: To improve strength, endurance, mobility, and function of specific targeted muscle groups or improve joint range of motion or improve muscle flexibility  UE ranger- Standing at wall- UE shoulder flex/scap 3 x 10 reps. Varying ROM and positions. Seated position horizontal abd/add 3 x 10   Wall push ups 2 x 10 reps (pain free)   Supine Wand flexion: 2x10 reps Supine Wand Abd: 2x 10 reps Supine Wand shoulder ER: 2 x 10 reps   Self care/home management:  Patient requested some info on UE ranger because he really enjoyed using it today and found it to be beneficial.  Added wand exercises to HEP   PATIENT EDUCATION: Education details: Pt educated on role of PT and services provided during current POC, along with prognosis and information about the clinic.  Pt also given verbal cuing for proper form regarding the isometric exercises.    Person educated: Patient Education method: Explanation, Demonstration, Tactile cues, Verbal cues, and Handouts Education comprehension: verbalized understanding, returned demonstration, verbal cues required, and tactile cues required  HOME EXERCISE PROGRAM: Updated 10/23/2024 Access Code: [email or web address] URL: [email or web address] Date: 10/23/2024 Prepared by: Reyes London  Exercises - Seated Scapular Retraction  - 1 x daily - 7 x weekly - 3 sets - 10 reps - Isometric Shoulder Flexion with Ball at Wall  - 1 x daily - 7 x weekly - 3 sets - 10 reps - 3 hold - Isometric Shoulder Extension with Ball at Wall  - 1 x daily - 7 x weekly - 3 sets - 10 reps - 3 hold - Isometric Shoulder Abduction with Ball at Wall  - 1 x daily - 7 x weekly - 3 sets - 10 reps - 3  hold - Isometric Shoulder Abduction with Ball - Arm Straight at Wall  - 1 x daily - 7 x  weekly - 3 sets - 10 reps - Supine Shoulder Flexion with Dowel  - 1 x daily - 3 sets - 10 reps - Supine Shoulder Abduction AAROM with Dowel  - 1 x daily - 3 sets - 10 reps - Supine Shoulder External Rotation in 45 Degrees Abduction AAROM with Dowel  - 1 x daily - 3 sets - 10 reps      Access Code: [email or web address] URL: [email or web address] Date: 10/17/2024 Prepared by: Sidra Simpers  Exercises - Seated Scapular Retraction  - 1 x daily - 7 x weekly - 3 sets - 10 reps - Isometric Shoulder Flexion with Ball at Wall  - 1 x daily - 7 x weekly - 3 sets - 10 reps - 3 hold - Isometric Shoulder Extension with Ball at Wall  - 1 x daily - 7 x weekly - 3 sets - 10 reps - 3 hold - Isometric Shoulder Abduction with Ball at Wall  - 1 x daily - 7 x weekly - 3 sets - 10 reps - 3 hold - Isometric Shoulder Abduction with Ball - Arm Straight at Wall  - 1 x daily - 7 x weekly - 3 sets - 10 reps  ASSESSMENT:  CLINICAL IMPRESSION: Patient presents with some tightness and immobility Right shoulder. Added UE ranger to his session and he performed well overall - denying any pain and responsive to all new activities. He also performed fairly well with wand activities - provided for HEP but he will likely need a review to ensure he is performing correctly next session. Patient will benefit from continued PT services to improve his Right Shoulder ROM, strength and function  in order to return to full function at home with less shoulder pain.    OBJECTIVE IMPAIRMENTS: decreased endurance, decreased mobility, decreased ROM, decreased strength, hypomobility, impaired UE functional use, and pain.   ACTIVITY LIMITATIONS: carrying, lifting, bending, transfers, bathing, toileting, dressing, reach over head, and hygiene/grooming  PARTICIPATION LIMITATIONS: meal prep, cleaning, laundry, driving, shopping, community activity, and  yard work  PERSONAL FACTORS: Age, Fitness, Past/current experiences, Time since onset of injury/illness/exacerbation, and 3+ comorbidities: anxiety, biceps tendinitis, CVA, DM2, HTN, Heart attack, Heart disease,Shoulder impingement are also affecting patient's functional outcome.   REHAB POTENTIAL: Good  CLINICAL DECISION MAKING: Evolving/moderate complexity  EVALUATION COMPLEXITY: Moderate   GOALS: Goals reviewed with patient? Yes  SHORT TERM GOALS: Target date: 01/08/2025  Pt will be independent with HEP in order to demonstrate increased ability to perform tasks related to occupation/hobbies. Baseline: Pt given HEP at evaluation today.   Goal status: INITIAL  LONG TERM GOALS: Target date: 01/08/2025  Pt will reduce overall pain level to 0/10 by utilizing a combination of stretching, strengthening exercises, and pain-reducing modalities in order to improve overall QoL. Baseline: 3/10 at rest Goal status: INITIAL  2.  Pt will decrease quick DASH score by at least 8% in order to demonstrate clinically significant reduction in disability.  Baseline: 68.2% Goal status: INITIAL  3.  Pt will increase strength of  by at least 1/2 MMT grade in order to demonstrate improvement in strength and function  Baseline: see MMT chart above Goal status: INITIAL  PLAN:  PT FREQUENCY: 2x/week  PT DURATION: 12 weeks  PLANNED INTERVENTIONS: 97750- Physical Performance Testing, 97110-Therapeutic exercises, 97530- Therapeutic activity, [id number]- Neuromuscular re-education, 97535- Self Care, [id number]- Manual therapy, [id number]- Electrical stimulation (manual), 20560 (1-2 muscles), 20561 (3+ muscles)- Dry Needling, Patient/Family education, Joint mobilization, Joint manipulation, and Vestibular training  PLAN FOR NEXT  SESSION:   Continue working to improve L shoulder mobility and strength along with stability of L shoulder with progressive exercises to patient's tolerance level.  Review wand activities next  visit   Chyrl London, PT Physical Therapist - [facility]  10/24/2024, 5:20 PM  "

## 2024-10-23 NOTE — Telephone Encounter (Signed)
 [facility]   Per CC pt needs markers placed and Eligard inj.   [facility] pt.

## 2024-10-24 NOTE — Telephone Encounter (Signed)
 LMTRC

## 2024-10-24 NOTE — Telephone Encounter (Signed)
 Pt returned my call - At this time he declines and appt for Eligard and Marker placement. He prefers to f/u with [facility] in 4/26.   Staff message sent to CC and [facility].

## 2024-10-26 ENCOUNTER — Ambulatory Visit

## 2024-10-26 DIAGNOSIS — I639 Cerebral infarction, unspecified: Secondary | ICD-10-CM

## 2024-10-26 LAB — CUP PACEART REMOTE DEVICE CHECK
Date Time Interrogation Session: 20251224233448
Implantable Pulse Generator Implant Date: 20240813

## 2024-10-27 NOTE — Progress Notes (Signed)
 Remote Loop Recorder Transmission

## 2024-10-29 ENCOUNTER — Ambulatory Visit: Payer: Self-pay | Admitting: Cardiology

## 2024-10-30 ENCOUNTER — Ambulatory Visit

## 2024-10-30 DIAGNOSIS — M6281 Muscle weakness (generalized): Secondary | ICD-10-CM

## 2024-10-30 DIAGNOSIS — G8929 Other chronic pain: Secondary | ICD-10-CM

## 2024-10-30 DIAGNOSIS — M25612 Stiffness of left shoulder, not elsewhere classified: Secondary | ICD-10-CM

## 2024-10-30 NOTE — Therapy (Signed)
 " OUTPATIENT PHYSICAL THERAPY L SHOULDER TREATMENT   Patient Name: Shannon Chung MRN: [id number] DOB:[birth year], 83 y.o., male Today's Date: 10/31/2024  END OF SESSION:  PT End of Session - 10/30/24 1449     Visit Number 4    Number of Visits 25    Date for Recertification  01/08/25    Authorization Type [employer] Medicare    Progress Note Due on Visit 10    PT Start Time 1448    PT Stop Time 1529    PT Time Calculation (min) 41 min    Equipment Utilized During Treatment Gait belt    Activity Tolerance Patient tolerated treatment well;No increased pain    Behavior During Therapy [facility] for tasks assessed/performed            Past Medical History:  Diagnosis Date   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 06/2015 Cardiac CT: Ca score 1103 (84th %'ile);  b. 07/2015 Cath: LM 70, LAD 80p, 100/27m, D1 70, D2 95, RI 75, RCA 100p/m;  c. 07/2015 CABG x 5 (LIMA->LAD, VG->Diag, VG->OM1->OM2, VG->OM3).   COVID-19    12/2021   COVID-19 01/18/2022   COVID-19 vaccine administered 01/18/2022   Unknown how many vaccine doses have been received. Entered from Emergency Triage Note.   Diabetes mellitus without complication (HCC) 07/2015   Dyslipidemia    Essential hypertension    Essential hypertension 01/02/2015   Formatting of this note might be different from the original.  Last Assessment & Plan:   Chronic, stable. Continue current regimen.   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 dilated, EF 35% improved to 45-50% (2015);  b. 07/2015 EF 25-35% by LV gram.   Ischemic cardiomyopathy 01/02/2015   Kidney stones 04/17/2021   Lone atrial fibrillation (HCC) 1983   a. isolated episode, not on OAC.   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 06/2014: LV; resolved with coumadin-->no residual on f/u echo, no longer on coumadin.   Mural thrombus of heart 08/26/2021   Formatting of this note might be different from the original. Jun 16, 2019 Entered By: VINIE ALLEAN AQUAS Comment: left ventricle, cardiac apex   Osteoarthritis    a. 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 Jun 16, 2019 Entered By: VINIE ALLEAN AQUAS Comment: leftAug 19, 2020 Entered By: VINIE ALLEAN AQUAS Comment: resolved by total left knee replacement Jun 16, 2019 Entered By: VINIE ALLEAN AQUAS Comment: leftAug 19, 2020 Entered By: VINIE ALLEAN AQUAS Comment: resolved by total left knee replacement   Trigger finger of left hand 07/07/2019   Vitamin D  deficiency    Past Surgical History:  Procedure Laterality Date   BICEPS TENDON REPAIR Right 1993   CARDIAC CATHETERIZATION N/A 07/05/2015   Procedure: Left Heart Cath and Coronary Angiography;  Surgeon: Evalene JINNY Lunger, MD;  Location: [facility] INVASIVE CV LAB;  Service: Cardiovascular;  Laterality: N/A;   CAROTID PTA/STENT INTERVENTION Left 04/26/2023   Procedure: CAROTID PTA/STENT INTERVENTION;  Surgeon: Marea Selinda RAMAN, MD;  Location: [facility] INVASIVE CV LAB;  Service: Cardiovascular;  Laterality: Left;   CATARACT EXTRACTION Left 12/2016   with keratoplasty   COLONOSCOPY  2007   COLONOSCOPY WITH PROPOFOL  N/A 12/02/2017   TA, SSA, rpt 3 yrs(Tahiliani, Varnita B, MD)   COLONOSCOPY WITH PROPOFOL  N/A 11/26/2020   Procedure: COLONOSCOPY WITH PROPOFOL ;  Surgeon: Jinny Carmine, MD;  Location: [facility] ENDOSCOPY;  Service: Endoscopy;  Laterality: N/A;   CORONARY ARTERY BYPASS GRAFT N/A 07/29/2015   Procedure: CORONARY ARTERY BYPASS GRAFTING (CABG) x 5 (LIMA to LAD, SVG to DIAGONAL,  SVG SEQUENTIALLY to OM1 and OM2, SVG to OM3) with Endoscopic Vein Havesting of  GREATER SAPHENOUS VEIN from RIGHT THIGH and partial LOWER LEG ;  Surgeon: Dorise MARLA Fellers, MD;  Location: [facility] OR;  Service: Open Heart Surgery;  Laterality: N/A;   EYE SURGERY     b/l cataract and cornea replaced    HAND SURGERY     left hand 1st/2nd trigger fingers Dr. Cleotilde ortho    JOINT REPLACEMENT     KNEE ARTHROSCOPY Left remote   MOHS SURGERY      left cheek scc 2022 Dr. Lloyd   MOHS SURGERY     x 5 facial scc   right biceps tendon     repair/re attachment    SKIN CANCER EXCISION  10/2015   BCC - L ala (pending MOHs) and L scapula (complete excision)   TEE WITHOUT CARDIOVERSION N/A 07/29/2015   Procedure: TRANSESOPHAGEAL ECHOCARDIOGRAM (TEE);  Surgeon: Dorise MARLA Fellers, MD;  Location: [facility];  Service: Open Heart Surgery;  Laterality: N/A;   TONSILLECTOMY  1949   TOTAL KNEE ARTHROPLASTY Left 03/18/2016   cemented L TKR; Kayla Cleotilde, MD   Patient Active Problem List   Diagnosis Date Noted   Back pain 08/09/2024   Stroke-like  symptoms 08/01/2024   Status post placement of implantable loop recorder 07/12/2024   Seizure disorder (HCC) 06/28/2024   Annual physical exam 07/10/2023   TIA (transient ischemic attack) 05/02/2023   GERD without esophagitis 05/02/2023   Carotid stenosis, symptomatic, with infarction (HCC) 04/26/2023   History of CVA (cerebrovascular accident) without residual deficits 03/04/2023   Hemiparesis affecting right side as late effect of cerebrovascular accident (CVA) (HCC) 02/22/2023   Chronic radicular pain of lower back 08/10/2022   Cervical spondylosis 03/11/2022   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   Bilateral carotid artery stenosis 03/11/2022   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   Lumbar spondylosis 10/07/2021   DDD (degenerative disc disease), lumbar 10/07/2021   History of radiation therapy 08/26/2021   Aortic atherosclerosis 04/17/2021   Diverticulosis 04/17/2021   Hypertension associated with diabetes (HCC) 10/07/2020   Overweight (BMI 25.0-29.9) 10/07/2020   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   Vitamin D  deficiency 08/01/2020   Polyp of sigmoid colon 08/01/2020   Insomnia 06/07/2020   Degenerative joint disease of hand 03/01/2020   BPH (benign prostatic hyperplasia) 08/05/2018   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   Macular scar of both eyes 04/20/2018   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   Scotoma involving central area of both eyes 04/20/2018   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   Fatty liver 03/25/2018   Erectile dysfunction 02/23/2017   Hx of CABG 01/24/2017   Advanced care planning/counseling discussion 10/21/2015   Coronary artery disease of native artery of native heart with stable angina pectoris    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   Dyslipidemia 01/02/2015   Osteoarthritis 01/02/2015   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/02/2014    PCP: Gretel App, NP   REFERRING PROVIDER: Kathlynn Sharper, MD   M75.102,M12.812 (ICD-10-CM) - Left rotator cuff tear arthropathy   THERAPY DIAG:  Chronic left shoulder pain  Decreased ROM of left shoulder  Muscle weakness (generalized)  Rationale for Evaluation and Treatment: Rehabilitation  ONSET DATE: 07/03/24  SUBJECTIVE:                                                                                                                                                                                      SUBJECTIVE STATEMENT:  Patient report having some soreness for only 1 day after session and states doing ok. Just a tad pain in R shoulder today. States feeling good enough that after today he would like to continue with shoulder activities at home but concentrate on his original referral for his balance     Hand dominance: Right  PERTINENT HISTORY:  Per MD: He reports left shoulder pain,  described as a deep ache that occasionally radiates down his arm. 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 He has not had any shoulder surgeries but recalls possible injuries from playing football and other sports about twenty years ago. He notes muscle shrinkage and a weight loss of about twenty pounds over the last year or two.   His current medications include metformin  and  Keppra , the latter prescribed following a stroke. He denies taking tramadol  or Ultram  for pain management. He has a history of a pacemaker on the left side, which is not related to his current rib pain.  PAIN:  Are you having pain? Yes: NPRS scale: 3/10 Pain location: posterior shoulder, deep within the joint. Pain description: dull pain Aggravating factors: horizontal adduction, abduction Relieving factors: rest  PRECAUTIONS: Fall  RED FLAGS: None   WEIGHT BEARING RESTRICTIONS: No  FALLS:  Has patient fallen in last 6 months? No  LIVING ENVIRONMENT: Lives with: lives with their spouse Lives in: House/apartment-duplex Stairs: No Has following equipment at home: Single point cane and Environmental Consultant - 2 wheeled  OCCUPATION: N/A - Retired: [employer]  PLOF: Independent  PATIENT GOALS: Improve pain/ache  NEXT MD VISIT:   OBJECTIVE:  Note: Objective measures were completed at Evaluation unless otherwise noted.  DIAGNOSTIC FINDINGS:    RADIOLOGY Left shoulder X-ray: Significant glenohumeral arthrosis, high riding humeral head, large spur at inferior glenoid and humeral head, scapular Y view shows very high riding humeral head, rotator cuff wear consistent with cuff arthropathy, significant erosion of anterior acromion and spurring of glenohumeral joint, no acute process (10/10/2024)  PATIENT SURVEYS:  Quick Dash:  QUICK DASH  Please rate your ability do the following activities in the last week by selecting the number below the appropriate response.   Activities Rating  Open a tight or new jar.  4 = Severe difficulty  Do heavy household chores (e.g., wash walls, floors). 4 = Severe difficulty  Carry a shopping bag or briefcase 3 = Moderate difficulty  Wash your back. 5 = Unable  Use a knife to cut food. 4 = Severe difficulty  Recreational activities in which you take some force or impact through your arm, shoulder or hand (e.g., golf, hammering, tennis, etc.). 4 = Severe  difficulty  During the past week, to what extent has your arm, shoulder or hand problem interfered with your normal social activities with family, friends, neighbors or groups?  4 = Quite a bit  During the past week, were you limited in your work or other regular daily activities as a result of your arm, shoulder or hand problem? 4 = Very limited  Rate the severity of the following symptoms in the last week: Arm, Shoulder, or hand pain. 3 = Moderate  Rate the severity of the following symptoms in the last week: Tingling (pins and needles) in your arm, shoulder or hand. 3 = Moderate  During the past week, how much difficulty have you had sleeping because of the pain in your arm, shoulder or hand?  3 = Moderate difficulty   (A QuickDASH score may not be calculated if there is greater than 1 missing item.)  Quick Dash Disability/Symptom Score: 68.2%  Minimally Clinically Important Difference (MCID): 15-20 points  Flavio, F. et al. (2013). Minimally clinically important difference of the disabilities of the arm, shoulder, and hand outcome measures (DASH) and its shortened version (Quick DASH). Journal of Orthopaedic & Sports Physical Therapy, 44(1),  30-39)   COGNITION: Overall cognitive status: Within functional limits for tasks assessed     SENSATION: WFL  POSTURE: Pt with adequate posture in sitting, however is wearing an abdominal brace for back support.  UPPER EXTREMITY ROM:   Active ROM Right eval Left eval  Shoulder flexion 142 139  Shoulder abduction 156 111*  (Blank rows = not tested)  UPPER EXTREMITY MMT:;  MMT Right eval Left eval  Shoulder flexion 4- 3+  Shoulder abduction 3+ 3+  Upper trapezius 5 5  Elbow flexion 4+ 4+  Elbow extension 4+ 4+  Grip strength (lbs) 71 65  (Blank rows = not tested)  SHOULDER SPECIAL TESTS: Impingement tests: Neer impingement test: negative, Hawkins/Kennedy impingement test: positive , and Painful arc test: negative  Rotator  cuff assessment: Empty can test: positive , Full can test: positive , External rotation lag sign: negative, and Belly press test: positive    JOINT MOBILITY TESTING:  Pt with significant pain with   PALPATION:  Pt with tenderness noted in the infraspinatus, subscapularis, and UT of the L shoulder.                                                                                                                             TREATMENT DATE: 10/31/2024    TherEx: To improve strength, endurance, mobility, and function of specific targeted muscle groups or improve joint range of motion or improve muscle flexibility   PROM to left shoulder- flex, scaption, abd, IR, ER- approx 12 min  Measured left shoulder elevation- full flex and 153 deg ABD with 1/10 pain  Supine Wand flexion: 2x10 reps Supine Wand Abd: 2x 10 reps Supine Wand shoulder ER: 2 x 10 reps   Standing Shoulder ext (GTB) 2 x 10  Standing Shoulder IR (GTB) 2 x 10  Standing Shoulder ER (RTB) 2 x 10    Self care:  Added RC strengthening to HEP and revised handout reviewing all activities     PATIENT EDUCATION: Education details: Pt educated on role of PT and services provided during current POC, along with prognosis and information about the clinic.  Pt also given verbal cuing for proper form regarding the isometric exercises.    Person educated: Patient Education method: Explanation, Demonstration, Tactile cues, Verbal cues, and Handouts Education comprehension: verbalized understanding, returned demonstration, verbal cues required, and tactile cues required  HOME EXERCISE PROGRAM: Updated 10/30/2024   Access Code: [email or web address] URL: [email or web address] Date: 10/30/2024 Prepared by: Reyes London  Exercises - Seated Scapular Retraction  - 1 x daily - 7 x weekly - 3 sets - 10 reps - Isometric Shoulder Flexion with Ball at Wall  - 1 x daily - 7 x weekly - 3 sets - 10 reps - 3 hold - Isometric Shoulder  Extension with Ball at Wall  - 1 x daily - 7 x weekly - 3 sets - 10 reps - 3 hold - Isometric Shoulder Abduction with Ball at [employer]  -  1 x daily - 7 x weekly - 3 sets - 10 reps - 3 hold - Isometric Shoulder Abduction with Ball - Arm Straight at Wall  - 1 x daily - 7 x weekly - 3 sets - 10 reps - Supine Shoulder Flexion with Dowel  - 1 x daily - 3 sets - 10 reps - Supine Shoulder Abduction AAROM with Dowel  - 1 x daily - 3 sets - 10 reps - Supine Shoulder External Rotation in 45 Degrees Abduction AAROM with Dowel  - 1 x daily - 3 sets - 10 reps - Shoulder Internal Rotation with Resistance  - 3 x weekly - 3 sets - 10 reps - Shoulder External Rotation with Anchored Resistance  - 3 x weekly - 3 sets - 10 reps - Standing Single Arm Shoulder Abduction with Resistance  - 3 x weekly - 3 sets - 10 reps - Single Arm Shoulder Extension with Resistance  - 3 x weekly - 3 sets - 10 reps    Access Code: [email or web address] URL: [email or web address] Date: 10/23/2024 Prepared by: Reyes London  Exercises - Seated Scapular Retraction  - 1 x daily - 7 x weekly - 3 sets - 10 reps - Isometric Shoulder Flexion with Ball at Wall  - 1 x daily - 7 x weekly - 3 sets - 10 reps - 3 hold - Isometric Shoulder Extension with Ball at [employer]  - 1 x daily - 7 x weekly - 3 sets - 10 reps - 3 hold - Isometric Shoulder Abduction with Ball at Wall  - 1 x daily - 7 x weekly - 3 sets - 10 reps - 3 hold - Isometric Shoulder Abduction with Ball - Arm Straight at Wall  - 1 x daily - 7 x weekly - 3 sets - 10 reps - Supine Shoulder Flexion with Dowel  - 1 x daily - 3 sets - 10 reps - Supine Shoulder Abduction AAROM with Dowel  - 1 x daily - 3 sets - 10 reps - Supine Shoulder External Rotation in 45 Degrees Abduction AAROM with Dowel  - 1 x daily - 3 sets - 10 reps      Access Code: [email or web address] URL: [email or web address] Date: 10/17/2024 Prepared by: Sidra Simpers  Exercises - Seated Scapular Retraction  - 1  x daily - 7 x weekly - 3 sets - 10 reps - Isometric Shoulder Flexion with Ball at Wall  - 1 x daily - 7 x weekly - 3 sets - 10 reps - 3 hold - Isometric Shoulder Extension with Ball at Wall  - 1 x daily - 7 x weekly - 3 sets - 10 reps - 3 hold - Isometric Shoulder Abduction with Ball at Wall  - 1 x daily - 7 x weekly - 3 sets - 10 reps - 3 hold - Isometric Shoulder Abduction with Ball - Arm Straight at Wall  - 1 x daily - 7 x weekly - 3 sets - 10 reps  ASSESSMENT:  CLINICAL IMPRESSION: Patient requesting today be the last day of focus on his shoulder as he states he is doing fairly well and with the exercises provided today feels he can continue on his own. He presented with full active Left shoulder flex and > 150 deg of abd (improved from Eval). He was instructed in some basic rotator cuff strengthening and reviewed wand exercises. Did not reassess all goals due to time constraints and wanting to make sure that he understood a finalized HEP for  shoulder. Since a new episode of care was started for his shoulder instructed him that he would just need to contact MD and receive a new script to work on his balance. He verbalized understanding and to contact MD. Will plan to just do a re-eval next session (if order for imbalance received) - and assess his progress from his last episode of care just prior to shoulder episode of care. Will just add goals onto existing shoulder goals and be able to treat both conditions if needed.   OBJECTIVE IMPAIRMENTS: decreased endurance, decreased mobility, decreased ROM, decreased strength, hypomobility, impaired UE functional use, and pain.   ACTIVITY LIMITATIONS: carrying, lifting, bending, transfers, bathing, toileting, dressing, reach over head, and hygiene/grooming  PARTICIPATION LIMITATIONS: meal prep, cleaning, laundry, driving, shopping, community activity, and yard work  PERSONAL FACTORS: Age, Fitness, Past/current experiences, Time since onset of  injury/illness/exacerbation, and 3+ comorbidities: anxiety, biceps tendinitis, CVA, DM2, HTN, Heart attack, Heart disease,Shoulder impingement are also affecting patient's functional outcome.   REHAB POTENTIAL: Good  CLINICAL DECISION MAKING: Evolving/moderate complexity  EVALUATION COMPLEXITY: Moderate   GOALS: Goals reviewed with patient? Yes  SHORT TERM GOALS: Target date: 01/08/2025  Pt will be independent with HEP in order to demonstrate increased ability to perform tasks related to occupation/hobbies. Baseline: Pt given HEP at evaluation today.   Goal status: INITIAL  LONG TERM GOALS: Target date: 01/08/2025  Pt will reduce overall pain level to 0/10 by utilizing a combination of stretching, strengthening exercises, and pain-reducing modalities in order to improve overall QoL. Baseline: 3/10 at rest; 10/30/2024= 1/10 Goal status: PROGRESSING  2.  Pt will decrease quick DASH score by at least 8% in order to demonstrate clinically significant reduction in disability.  Baseline: 68.2% Goal status: INITIAL  3.  Pt will increase strength of  by at least 1/2 MMT grade in order to demonstrate improvement in strength and function  Baseline: see MMT chart above Goal status: INITIAL  PLAN:  PT FREQUENCY: 2x/week  PT DURATION: 12 weeks  PLANNED INTERVENTIONS: 97750- Physical Performance Testing, 97110-Therapeutic exercises, 97530- Therapeutic activity, [id number]- Neuromuscular re-education, 97535- Self Care, [id number]- Manual therapy, [id number]- Electrical stimulation (manual), 20560 (1-2 muscles), 20561 (3+ muscles)- Dry Needling, Patient/Family education, Joint mobilization, Joint manipulation, and Vestibular training  PLAN FOR NEXT SESSION:   See assessment for details- Plan to re-eval for balance as patient reports this is his main concern. Check to see if new orders are in and add to existing goals to be able to treat both issues.   Chyrl London, PT Physical Therapist - [place]   [facility]  10/31/2024, 7:00 AM  "

## 2024-11-01 ENCOUNTER — Ambulatory Visit

## 2024-11-05 ENCOUNTER — Encounter

## 2024-11-06 ENCOUNTER — Ambulatory Visit

## 2024-11-08 ENCOUNTER — Ambulatory Visit

## 2024-11-13 ENCOUNTER — Ambulatory Visit

## 2024-11-15 ENCOUNTER — Ambulatory Visit

## 2024-11-20 ENCOUNTER — Encounter: Payer: Self-pay | Admitting: Medical

## 2024-11-20 ENCOUNTER — Ambulatory Visit: Attending: Medical | Admitting: Medical

## 2024-11-20 ENCOUNTER — Ambulatory Visit

## 2024-11-20 VITALS — BP 130/74 | HR 72 | Ht 72.0 in | Wt 192.8 lb

## 2024-11-20 DIAGNOSIS — I10 Essential (primary) hypertension: Secondary | ICD-10-CM | POA: Diagnosis not present

## 2024-11-20 DIAGNOSIS — E782 Mixed hyperlipidemia: Secondary | ICD-10-CM

## 2024-11-20 DIAGNOSIS — I639 Cerebral infarction, unspecified: Secondary | ICD-10-CM

## 2024-11-20 DIAGNOSIS — I255 Ischemic cardiomyopathy: Secondary | ICD-10-CM | POA: Diagnosis not present

## 2024-11-20 DIAGNOSIS — I6523 Occlusion and stenosis of bilateral carotid arteries: Secondary | ICD-10-CM | POA: Diagnosis not present

## 2024-11-20 DIAGNOSIS — I2510 Atherosclerotic heart disease of native coronary artery without angina pectoris: Secondary | ICD-10-CM | POA: Diagnosis not present

## 2024-11-20 MED ORDER — ATORVASTATIN CALCIUM 40 MG PO TABS: 40.0000 mg | ORAL_TABLET | Freq: Every day | ORAL | 3 refills | Status: AC

## 2024-11-20 NOTE — Addendum Note (Signed)
 Addended by: DESIDERIO RUSSELL SAILOR on: 11/20/2024 03:18 PM   Modules accepted: Orders

## 2024-11-20 NOTE — Progress Notes (Signed)
 " Cardiology Office Note   Date:  11/20/2024  ID:  Shannon Chung, DOB 04/08/1941, MRN [id number] PCP: Gretel App, NP  [facility] Providers Cardiologist:  Evalene Lunger, MD Electrophysiologist:  OLE ONEIDA HOLTS, MD (Inactive)   History of Present Illness Shannon Chung is a 84 y.o. male with a h/o CAD s/p CABG x5 in 2016, bilateral carotid stenosis, LV apical thrombus, ICM, frequent PVCs, HTN, HLD, carotid artery endarterectomy s/p L carotid endarterectomy, CVA, s/p ILR 06/2023, aortic atherosclerosis, DM2 who presents for follow-up of CAD.    The patient was first evaluated by Dr. Gollan 01/2015 to establish care after moving to the area. He reported h/o ICM with EF 35%, Cardiac PET showing scar in the apical and periapical region, mural thrombus on anticoagulation in July 2015. He did not have a LHC at that time. Dr. Gollan ordered a coronary CT which showed a calcium  score of 1103 and most calcium  in p-m LAD. LHC in September 2016 showed severe multivessel and LM disease so he was referred to CT surgery. He underwent CABG x5 in 07/2015. Most recent echo 11/2015 showed LVEF 55-60%, mild LVH, G1DD.    The patient underwent stroke work-up 02/2023 notable for 70% left carotid stenosis and negative MRI. She subsequently had left carotid endarterectomy and pos-op on 6/24 had expressive aphasia with word salad. She had recurrent episodes and presented to [facility] for each of them. She saw EP and underwent ILR implant 06/15/23.   Patient was last seen 03/08/2024 and was overall doing well from a cardiac perspective.  No A-fib detected.   Patient was admitted early October 2025 for acute ischemic infarct likely secondary to large vessel disease. Echo showed preserved EF, negative bubble study. He was continued on aspirin , statin, Plavix .  Patient was last seen 08/17/2024 and was overall okay.  Wife said neurologically patient was not back to normal.  She reported persistent memory  issues.  Interrogation of device showed no A-fib episodes.  Today the patient reports he is overall doing ok. Says he has chronic memory issues that are unchanged. He has PT for balance issues, which may have been from stroke. He has both a cane and a walker, but does not need them. He denies chest pain, SOB, lower leg edema.   Studies Reviewed EKG Interpretation Date/Time:  Monday November 20 2024 13:26:51 EST Ventricular Rate:  72 PR Interval:  140 QRS Duration:  94 QT Interval:  394 QTC Calculation: 431 R Axis:   -4  Text Interpretation: Normal sinus rhythm Septal infarct , age undetermined When compared with ECG of 17-Aug-2024 14:04, No significant change was found Confirmed by Franchester, Harmonee Tozer ([id number]) on 11/20/2024 1:28:51 PM    Echo 08/02/24  1. Left ventricular ejection fraction, by estimation, is 55 to 60%. The  left ventricle has normal function. The left ventricle demonstrates  regional wall motion abnormalities (hypokinesis of the mid to distal  anteroseptal and apical region). Left  ventricular diastolic parameters are consistent with Grade I diastolic  dysfunction (impaired relaxation).   2. Right ventricular systolic function is normal. The right ventricular  size is normal.   3. The mitral valve is normal in structure. No evidence of mitral valve  regurgitation. No evidence of mitral stenosis.   4. The aortic valve is tricuspid. There is mild calcification of the  aortic valve. Aortic valve regurgitation is not visualized. Aortic valve  sclerosis is present, with no evidence of aortic valve stenosis.   5. There is  borderline dilatation of the aortic root, measuring 39 mm.  There is borderline dilatation of the ascending aorta, measuring 38 mm.   6. The inferior vena cava is normal in size with greater than 50%  respiratory variability, suggesting right atrial pressure of 3 mmHg.   7. Agitated saline contrast bubble study was negative, with no evidence  of any interatrial  shunt.    Echo 02/2023 1. Left ventricular ejection fraction, by estimation, is >55%. The left  ventricle has normal function. Left ventricular endocardial border not  optimally defined to evaluate regional wall motion. There is mild left  ventricular hypertrophy. Left  ventricular diastolic parameters are consistent with Grade I diastolic  dysfunction (impaired relaxation).   2. Right ventricular systolic function was not well visualized. The right  ventricular size is normal. Tricuspid regurgitation signal is inadequate  for assessing PA pressure.   3. The mitral valve is grossly normal. No evidence of mitral valve  regurgitation. No evidence of mitral stenosis.   4. The aortic valve is tricuspid. There is mild thickening of the aortic  valve. Aortic valve regurgitation is not visualized. Aortic valve  sclerosis is present, with no evidence of aortic valve stenosis.    LHC 2016 LM lesion, 70% stenosed. Mid LAD-1 lesion, 100% stenosed. Mid LAD-2 lesion, 90% stenosed. 2nd Diag lesion, 95% stenosed. Prox LAD lesion, 80% stenosed. Ramus lesion, 75% stenosed. 1st Diag lesion, 70% stenosed. Lat 1st Diag lesion, 70% stenosed. Prox RCA to Mid RCA lesion, 100% stenosed. There is moderate left ventricular systolic dysfunction   Physical Exam VS:  BP 130/74   Pulse 72   Ht 6' (1.829 m)   Wt 192 lb 12.8 oz (87.5 kg)   SpO2 96%   BMI 26.15 kg/m        Wt Readings from Last 3 Encounters:  11/20/24 192 lb 12.8 oz (87.5 kg)  10/23/24 191 lb (86.6 kg)  10/06/24 190 lb (86.2 kg)    GEN: Well nourished, well developed in no acute distress NECK: No JVD; No carotid bruits CARDIAC: RRR, no murmurs, rubs, gallops RESPIRATORY:  Clear to auscultation without rales, wheezing or rhonchi  ABDOMEN: Soft, non-tender, non-distended EXTREMITIES:  No edema; No deformity   ASSESSMENT AND PLAN  Recurrent CVA s/p ILR Patient reports possible memory issues and balance issues since the stroke,  however he did have these issues prior to the stroke.  He is currently doing PT for balance with improvement.  Device interrogations have shown no evidence of A-fib.  Continue aspirin , Plavix , Lipitor , Zetia .  CAD s/p CABG x 5 2016 Patient denies anginal symptoms.  He is doing PT for balance issues.  He is no longer requiring a cane or a walker.  He reports he is not taking Lipitor , unsure as to why.  We will continue aspirin , Plavix , Lipitor , Zetia  and Toprol .  Refill Lipitor  today.  Hyperlipidemia LDL 45.  Refill Lipitor  as above.  Continue Zetia  10 mg daily Lipitor  40 mg daily.  Hypertension Blood pressure is good.  Continue Toprol  25 mg daily.  Lisinopril  previously held due to hypotension.  History of ischemic cardiomyopathy Most recent echo showed LVEF 55 to 60%, grade 1 diastolic dysfunction, mild aortic valve calcification.  The patient is euvolemic on exam.  Continue beta-blocker therapy.  Carotid artery disease s/p stent in 2024 Carotid ultrasound 07/2023 showed 1 to 39% right ICA and patent left carotid stent.  He follows with vascular surgery. I will repeat a carotid. US . Continue ASA, Plavix , Lipitor   and Zetia .     Dispo: Follow-up in 6 months  Signed, Dovid Bartko VEAR Fishman, PA-C   "

## 2024-11-20 NOTE — Patient Instructions (Signed)
 Medication Instructions:  Your physician recommends that you continue on your current medications as directed. Please refer to the Current Medication list given to you today.    *If you need a refill on your cardiac medications before your next appointment, please call your pharmacy*  Lab Work: No labs ordered today    Testing/Procedures: No test ordered today   Follow-Up: At [facility], you and your health needs are our priority.  As part of our continuing mission to provide you with exceptional heart care, our providers are all part of one team.  This team includes your primary Cardiologist (physician) and Advanced Practice Providers or APPs (Physician Assistants and Nurse Practitioners) who all work together to provide you with the care you need, when you need it.  Your next appointment:   6 month(s)  Provider:   Timothy Gollan, MD or Cadence Franchester, PA-C

## 2024-11-22 ENCOUNTER — Ambulatory Visit

## 2024-11-26 ENCOUNTER — Ambulatory Visit: Attending: Cardiology

## 2024-11-26 DIAGNOSIS — I639 Cerebral infarction, unspecified: Secondary | ICD-10-CM | POA: Diagnosis not present

## 2024-11-27 ENCOUNTER — Ambulatory Visit

## 2024-11-27 ENCOUNTER — Ambulatory Visit: Payer: Self-pay | Admitting: Cardiology

## 2024-11-27 LAB — CUP PACEART REMOTE DEVICE CHECK
Date Time Interrogation Session: 20260124234701
Implantable Pulse Generator Implant Date: 20240813

## 2024-11-29 ENCOUNTER — Ambulatory Visit

## 2024-11-30 NOTE — Progress Notes (Signed)
 Remote Loop Recorder Transmission

## 2024-12-01 ENCOUNTER — Ambulatory Visit (HOSPITAL_COMMUNITY)

## 2024-12-04 ENCOUNTER — Ambulatory Visit

## 2024-12-06 ENCOUNTER — Encounter

## 2024-12-06 ENCOUNTER — Ambulatory Visit

## 2024-12-06 DIAGNOSIS — M25552 Pain in left hip: Secondary | ICD-10-CM

## 2024-12-06 DIAGNOSIS — G8929 Other chronic pain: Secondary | ICD-10-CM

## 2024-12-06 DIAGNOSIS — M25612 Stiffness of left shoulder, not elsewhere classified: Secondary | ICD-10-CM

## 2024-12-06 DIAGNOSIS — M6281 Muscle weakness (generalized): Secondary | ICD-10-CM

## 2024-12-06 DIAGNOSIS — R269 Unspecified abnormalities of gait and mobility: Secondary | ICD-10-CM

## 2024-12-06 DIAGNOSIS — R2689 Other abnormalities of gait and mobility: Secondary | ICD-10-CM

## 2024-12-06 DIAGNOSIS — M545 Low back pain, unspecified: Secondary | ICD-10-CM

## 2024-12-06 DIAGNOSIS — R262 Difficulty in walking, not elsewhere classified: Secondary | ICD-10-CM

## 2024-12-06 NOTE — Therapy (Signed)
 " OUTPATIENT PHYSICAL THERAPY L SHOULDER TREATMENT   Patient Name: Shannon Chung MRN: [id number] DOB:02-26-1941, 84 y.o., male Today's Date: 12/06/2024  END OF SESSION:  PT End of Session - 12/06/24 1358     Visit Number 5    Number of Visits 25    Date for Recertification  01/08/25    Authorization Type [employer] Medicare    Progress Note Due on Visit 10    PT Start Time 1400    PT Stop Time 1441    PT Time Calculation (min) 41 min    Equipment Utilized During Treatment Gait belt    Activity Tolerance Patient tolerated treatment well;No increased pain    Behavior During Therapy [facility] for tasks assessed/performed            Past Medical History:  Diagnosis Date   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 06/2015 Cardiac CT: Ca score 1103 (84th %'ile);  b. 07/2015 Cath: LM 70, LAD 80p, 100/13m, D1 70, D2 95, RI 75, RCA 100p/m;  c. 07/2015 CABG x 5 (LIMA->LAD, VG->Diag, VG->OM1->OM2, VG->OM3).   COVID-19    12/2021   COVID-19 01/18/2022   COVID-19 vaccine administered 01/18/2022   Unknown how many vaccine doses have been received. Entered from Emergency Triage Note.   Diabetes mellitus without complication (HCC) 07/2015   Dyslipidemia    Essential hypertension    Essential hypertension 01/02/2015   Formatting of this note might be different from the original.  Last Assessment & Plan:   Chronic, stable. Continue current regimen.   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 dilated, EF 35% improved to 45-50% (2015);  b. 07/2015 EF 25-35% by LV gram.   Ischemic cardiomyopathy 01/02/2015   Kidney stones 04/17/2021   Lone atrial fibrillation (HCC) 1983   a. isolated episode, not on OAC.   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 06/2014: LV; resolved with coumadin-->no residual on f/u echo, no longer on coumadin.   Mural thrombus of heart 08/26/2021   Formatting of this note might be different from the original. Jun 16, 2019 Entered By: VINIE ALLEAN AQUAS Comment: left ventricle, cardiac apex   Osteoarthritis    a. 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 Jun 16, 2019 Entered By: VINIE ALLEAN AQUAS Comment: leftAug 19, 2020 Entered By: VINIE ALLEAN AQUAS Comment: resolved by total left knee replacement Jun 16, 2019 Entered By: VINIE ALLEAN AQUAS Comment: leftAug 19, 2020 Entered By: VINIE ALLEAN AQUAS Comment: resolved by total left knee replacement   Trigger finger of left hand 07/07/2019   Vitamin D  deficiency    Past Surgical History:  Procedure Laterality Date   BICEPS TENDON REPAIR Right 1993   CARDIAC CATHETERIZATION N/A 07/05/2015   Procedure: Left Heart Cath and Coronary Angiography;  Surgeon: Evalene JINNY Lunger, MD;  Location: [facility] INVASIVE CV LAB;  Service: Cardiovascular;  Laterality: N/A;   CAROTID PTA/STENT INTERVENTION Left 04/26/2023   Procedure: CAROTID PTA/STENT INTERVENTION;  Surgeon: Marea Selinda RAMAN, MD;  Location: [facility] INVASIVE CV LAB;  Service: Cardiovascular;  Laterality: Left;   CATARACT EXTRACTION Left 12/2016   with keratoplasty   COLONOSCOPY  2007   COLONOSCOPY WITH PROPOFOL  N/A 12/02/2017   TA, SSA, rpt 3 yrs(Tahiliani, Varnita B, MD)   COLONOSCOPY WITH PROPOFOL  N/A 11/26/2020   Procedure: COLONOSCOPY WITH PROPOFOL ;  Surgeon: Jinny Carmine, MD;  Location: [facility] ENDOSCOPY;  Service: Endoscopy;  Laterality: N/A;   CORONARY ARTERY BYPASS GRAFT N/A 07/29/2015   Procedure: CORONARY ARTERY BYPASS GRAFTING (CABG) x 5 (LIMA to LAD, SVG to DIAGONAL,  SVG SEQUENTIALLY to OM1 and OM2, SVG to OM3) with Endoscopic Vein Havesting of  GREATER SAPHENOUS VEIN from RIGHT THIGH and partial LOWER LEG ;  Surgeon: Dorise MARLA Fellers, MD;  Location: [facility] OR;  Service: Open Heart Surgery;  Laterality: N/A;   EYE SURGERY     b/l cataract and cornea replaced    HAND SURGERY     left hand 1st/2nd trigger fingers Dr. Cleotilde ortho    JOINT REPLACEMENT     KNEE ARTHROSCOPY Left remote   MOHS SURGERY      left cheek scc 2022 Dr. Lloyd   MOHS SURGERY     x 5 facial scc   right biceps tendon     repair/re attachment    SKIN CANCER EXCISION  10/2015   BCC - L ala (pending MOHs) and L scapula (complete excision)   TEE WITHOUT CARDIOVERSION N/A 07/29/2015   Procedure: TRANSESOPHAGEAL ECHOCARDIOGRAM (TEE);  Surgeon: Dorise MARLA Fellers, MD;  Location: [facility] OR;  Service: Open Heart Surgery;  Laterality: N/A;   TONSILLECTOMY  1949   TOTAL KNEE ARTHROPLASTY Left 03/18/2016   cemented L TKR; Kayla Cleotilde, MD   Patient Active Problem List   Diagnosis Date Noted   Back pain 08/09/2024   Stroke-like  symptoms 08/01/2024   Status post placement of implantable loop recorder 07/12/2024   Seizure disorder (HCC) 06/28/2024   Annual physical exam 07/10/2023   TIA (transient ischemic attack) 05/02/2023   GERD without esophagitis 05/02/2023   Carotid stenosis, symptomatic, with infarction (HCC) 04/26/2023   History of CVA (cerebrovascular accident) without residual deficits 03/04/2023   Hemiparesis affecting right side as late effect of cerebrovascular accident (CVA) (HCC) 02/22/2023   Chronic radicular pain of lower back 08/10/2022   Cervical spondylosis 03/11/2022   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   Bilateral carotid artery stenosis 03/11/2022   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   Lumbar spondylosis 10/07/2021   DDD (degenerative disc disease), lumbar 10/07/2021   History of radiation therapy 08/26/2021   Aortic atherosclerosis 04/17/2021   Diverticulosis 04/17/2021   Hypertension associated with diabetes (HCC) 10/07/2020   Overweight (BMI 25.0-29.9) 10/07/2020   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   Vitamin D  deficiency 08/01/2020   Polyp of sigmoid colon 08/01/2020   Insomnia 06/07/2020   Degenerative joint disease of hand 03/01/2020   BPH (benign prostatic hyperplasia) 08/05/2018   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   Macular scar of both eyes 04/20/2018   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   Scotoma involving central area of both eyes 04/20/2018   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   Fatty liver 03/25/2018   Erectile dysfunction 02/23/2017   Hx of CABG 01/24/2017   Advanced care planning/counseling discussion 10/21/2015   Coronary artery disease of native artery of native heart with stable angina pectoris    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   Dyslipidemia 01/02/2015   Osteoarthritis 01/02/2015   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/02/2014    PCP: Gretel App, NP   REFERRING PROVIDER: Kathlynn Sharper, MD   M75.102,M12.812 (ICD-10-CM) - Left rotator cuff tear arthropathy   THERAPY DIAG:  Chronic left shoulder pain  Decreased ROM of left shoulder  Muscle weakness (generalized)  Difficulty in walking, not elsewhere classified  Abnormality of gait and mobility  Other abnormalities of gait and mobility  Pain in left hip  Bilateral low back pain without sciatica, unspecified chronicity  Rationale for Evaluation and Treatment: Rehabilitation  ONSET DATE: 07/03/24  SUBJECTIVE:                                                                                                                                                                                      SUBJECTIVE STATEMENT:  Patient report having some soreness for only 1 day after session and states doing ok. Just a tad pain in R shoulder today. States feeling good enough that after today he would like  to continue with shoulder activities at home but concentrate on his original referral for his balance     Hand dominance: Right  PERTINENT HISTORY:  Per MD: He reports left shoulder pain, described as a deep ache that occasionally radiates down his arm. 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 He has not had any shoulder surgeries but recalls  possible injuries from playing football and other sports about twenty years ago. He notes muscle shrinkage and a weight loss of about twenty pounds over the last year or two.   His current medications include metformin  and Keppra , the latter prescribed following a stroke. He denies taking tramadol  or Ultram  for pain management. He has a history of a pacemaker on the left side, which is not related to his current rib pain.  PAIN:  Are you having pain? Yes: NPRS scale: 3/10 Pain location: posterior shoulder, deep within the joint. Pain description: dull pain Aggravating factors: horizontal adduction, abduction Relieving factors: rest  PRECAUTIONS: Fall  RED FLAGS: None   WEIGHT BEARING RESTRICTIONS: No  FALLS:  Has patient fallen in last 6 months? No  LIVING ENVIRONMENT: Lives with: lives with their spouse Lives in: House/apartment-duplex Stairs: No Has following equipment at home: Single point cane and Environmental Consultant - 2 wheeled  OCCUPATION: N/A - Retired: [employer]  PLOF: Independent  PATIENT GOALS: Improve pain/ache  NEXT MD VISIT:   OBJECTIVE:  Note: Objective measures were completed at Evaluation unless otherwise noted.  DIAGNOSTIC FINDINGS:    RADIOLOGY Left shoulder X-ray: Significant glenohumeral arthrosis, high riding humeral head, large spur at inferior glenoid and humeral head, scapular Y view shows very high riding humeral head, rotator cuff wear consistent with cuff arthropathy, significant erosion of anterior acromion and spurring of glenohumeral joint, no acute process (10/10/2024)  PATIENT SURVEYS:  Quick Dash:  QUICK DASH  Please rate your ability do the following activities in the last week by selecting the number below the appropriate response.   Activities Rating  Open a tight or new jar.  4 = Severe difficulty  Do heavy household chores (e.g., wash walls, floors). 4 = Severe difficulty  Carry a shopping bag or briefcase 3 = Moderate difficulty  Wash your  back. 5 = Unable  Use a knife to cut food. 4 = Severe difficulty  Recreational activities in which you take some force or impact through your arm, shoulder or hand (e.g., golf, hammering, tennis, etc.). 4 = Severe difficulty  During the past week, to what extent has your arm, shoulder or hand problem interfered with your normal social activities with family, friends, neighbors or groups?  4 = Quite a bit  During the past week, were you limited in your work or other regular daily activities as a result of your arm, shoulder or hand problem? 4 = Very limited  Rate the severity of the following symptoms in the last week: Arm, Shoulder, or hand pain. 3 = Moderate  Rate the severity of the following symptoms in the last week: Tingling (pins and needles) in your arm, shoulder or hand. 3 = Moderate  During the past week, how much difficulty have you had sleeping because of the pain in your arm, shoulder or hand?  3 = Moderate difficulty   (A QuickDASH score may not be calculated if there is greater than 1 missing item.)  Quick Dash Disability/Symptom Score: 68.2%  Minimally Clinically Important Difference (MCID): 15-20 points  (Franchignoni,  F. et al. (2013). Minimally clinically important difference of the disabilities of the arm, shoulder, and hand outcome measures (DASH) and its shortened version (Quick DASH). Journal of Orthopaedic & Sports Physical Therapy, 44(1), 30-39)   COGNITION: Overall cognitive status: Within functional limits for tasks assessed     SENSATION: WFL  POSTURE: Pt with adequate posture in sitting, however is wearing an abdominal brace for back support.  UPPER EXTREMITY ROM:   Active ROM Right eval Left eval  Shoulder flexion 142 139  Shoulder abduction 156 111*  (Blank rows = not tested)  UPPER EXTREMITY MMT:;  MMT Right eval Left eval  Shoulder flexion 4- 3+  Shoulder abduction 3+ 3+  Upper trapezius 5 5  Elbow flexion 4+ 4+  Elbow extension 4+ 4+  Grip  strength (lbs) 71 65  (Blank rows = not tested)  SHOULDER SPECIAL TESTS: Impingement tests: Neer impingement test: negative, Hawkins/Kennedy impingement test: positive , and Painful arc test: negative  Rotator cuff assessment: Empty can test: positive , Full can test: positive , External rotation lag sign: negative, and Belly press test: positive    JOINT MOBILITY TESTING:  Pt with significant pain with   PALPATION:  Pt with tenderness noted in the infraspinatus, subscapularis, and UT of the L shoulder.                                                                                                                             TREATMENT DATE: 12/06/24    TherEx: To improve strength, endurance, mobility, and function of specific targeted muscle groups or improve joint range of motion or improve muscle flexibility  Seated L shoulder flexion: 2x5 Seated L shoulder abductoin: 2x5  Supine wand press: 3x10 with 5 lb.  Supine shoulder stabilization (circles) holding 2 lb. DB 3x20.    Standing Shoulder ext (GTB) 3 x 10  Standing Shoulder IR (GTB) 3 x 10  Standing Shoulder ER (GTB) 3 x 10         PATIENT EDUCATION: Education details: Pt educated on role of PT and services provided during current POC, along with prognosis and information about the clinic.  Pt also given verbal cuing for proper form regarding the isometric exercises.    Person educated: Patient Education method: Explanation, Demonstration, Tactile cues, Verbal cues, and Handouts Education comprehension: verbalized understanding, returned demonstration, verbal cues required, and tactile cues required  HOME EXERCISE PROGRAM: Updated 10/30/2024   Access Code: [email or web address] URL: [email or web address] Date: 10/30/2024 Prepared by: Reyes London  Exercises - Seated Scapular Retraction  - 1 x daily - 7 x weekly - 3 sets - 10 reps - Isometric Shoulder Flexion with Ball at Wall  - 1 x daily - 7 x weekly  - 3 sets - 10 reps - 3 hold - Isometric Shoulder Extension with Ball at Wall  - 1 x daily - 7 x weekly - 3 sets - 10 reps - 3 hold - Isometric Shoulder Abduction with  Ball at [employer]  - 1 x daily - 7 x weekly - 3 sets - 10 reps - 3 hold - Isometric Shoulder Abduction with Ball - Arm Straight at Wall  - 1 x daily - 7 x weekly - 3 sets - 10 reps - Supine Shoulder Flexion with Dowel  - 1 x daily - 3 sets - 10 reps - Supine Shoulder Abduction AAROM with Dowel  - 1 x daily - 3 sets - 10 reps - Supine Shoulder External Rotation in 45 Degrees Abduction AAROM with Dowel  - 1 x daily - 3 sets - 10 reps - Shoulder Internal Rotation with Resistance  - 3 x weekly - 3 sets - 10 reps - Shoulder External Rotation with Anchored Resistance  - 3 x weekly - 3 sets - 10 reps - Standing Single Arm Shoulder Abduction with Resistance  - 3 x weekly - 3 sets - 10 reps - Single Arm Shoulder Extension with Resistance  - 3 x weekly - 3 sets - 10 reps    Access Code: [email or web address] URL: [email or web address] Date: 10/23/2024 Prepared by: Reyes London  Exercises - Seated Scapular Retraction  - 1 x daily - 7 x weekly - 3 sets - 10 reps - Isometric Shoulder Flexion with Ball at Wall  - 1 x daily - 7 x weekly - 3 sets - 10 reps - 3 hold - Isometric Shoulder Extension with Ball at [employer]  - 1 x daily - 7 x weekly - 3 sets - 10 reps - 3 hold - Isometric Shoulder Abduction with Ball at Wall  - 1 x daily - 7 x weekly - 3 sets - 10 reps - 3 hold - Isometric Shoulder Abduction with Ball - Arm Straight at Wall  - 1 x daily - 7 x weekly - 3 sets - 10 reps - Supine Shoulder Flexion with Dowel  - 1 x daily - 3 sets - 10 reps - Supine Shoulder Abduction AAROM with Dowel  - 1 x daily - 3 sets - 10 reps - Supine Shoulder External Rotation in 45 Degrees Abduction AAROM with Dowel  - 1 x daily - 3 sets - 10 reps      Access Code: [email or web address] URL: [email or web address] Date: 10/17/2024 Prepared by: Sidra Simpers  Exercises - Seated Scapular Retraction  - 1 x daily - 7 x weekly - 3 sets - 10 reps - Isometric Shoulder Flexion with Ball at Wall  - 1 x daily - 7 x weekly - 3 sets - 10 reps - 3 hold - Isometric Shoulder Extension with Ball at Wall  - 1 x daily - 7 x weekly - 3 sets - 10 reps - 3 hold - Isometric Shoulder Abduction with Ball at Wall  - 1 x daily - 7 x weekly - 3 sets - 10 reps - 3 hold - Isometric Shoulder Abduction with Ball - Arm Straight at Wall  - 1 x daily - 7 x weekly - 3 sets - 10 reps  ASSESSMENT:  CLINICAL IMPRESSION: Patient requesting today to start out with the shoulder as he does not think that balance has really been an issue, but reports he has been slacking on shoulder exercises. He continued to demonstrate improved L shoulder AROM today, but does get very fatigued quickly with exercises, especially lifting the arm up against gravity. Pt had to reduce repetitions between sets today due to quick fatigue. He was instructed to stay consistent with his HEP exercises that previous PT  issued him as he demonstrated ability to perform them today properly. Pt does report mild shoulder discomfort at end ranges and when he becomes fatigued with exercises. Pt may benefit from continuing skilled PT at this time. Determine at next visit if patient wishes to continue with shoulder or transition back to balance deficits he was sent to PT for originally.   OBJECTIVE IMPAIRMENTS: decreased endurance, decreased mobility, decreased ROM, decreased strength, hypomobility, impaired UE functional use, and pain.   ACTIVITY LIMITATIONS: carrying, lifting, bending, transfers, bathing, toileting, dressing, reach over head, and hygiene/grooming  PARTICIPATION LIMITATIONS: meal prep, cleaning, laundry, driving, shopping, community activity, and yard work  PERSONAL FACTORS: Age, Fitness, Past/current experiences, Time since onset of injury/illness/exacerbation, and 3+ comorbidities: anxiety, biceps  tendinitis, CVA, DM2, HTN, Heart attack, Heart disease,Shoulder impingement are also affecting patient's functional outcome.   REHAB POTENTIAL: Good  CLINICAL DECISION MAKING: Evolving/moderate complexity  EVALUATION COMPLEXITY: Moderate   GOALS: Goals reviewed with patient? Yes  SHORT TERM GOALS: Target date: 01/08/2025  Pt will be independent with HEP in order to demonstrate increased ability to perform tasks related to occupation/hobbies. Baseline: Pt given HEP at evaluation today.   Goal status: INITIAL  LONG TERM GOALS: Target date: 01/08/2025  Pt will reduce overall pain level to 0/10 by utilizing a combination of stretching, strengthening exercises, and pain-reducing modalities in order to improve overall QoL. Baseline: 3/10 at rest; 10/30/2024= 1/10 Goal status: PROGRESSING  2.  Pt will decrease quick DASH score by at least 8% in order to demonstrate clinically significant reduction in disability.  Baseline: 68.2% Goal status: INITIAL  3.  Pt will increase strength of  by at least 1/2 MMT grade in order to demonstrate improvement in strength and function  Baseline: see MMT chart above Goal status: INITIAL  PLAN:  PT FREQUENCY: 2x/week  PT DURATION: 12 weeks  PLANNED INTERVENTIONS: 97750- Physical Performance Testing, 97110-Therapeutic exercises, 97530- Therapeutic activity, [id number]- Neuromuscular re-education, 97535- Self Care, [id number]- Manual therapy, [id number]- Electrical stimulation (manual), 20560 (1-2 muscles), 20561 (3+ muscles)- Dry Needling, Patient/Family education, Joint mobilization, Joint manipulation, and Vestibular training  PLAN FOR NEXT SESSION:   See assessment for details- Plan to re-eval for balance as patient reports this is his main concern. Check to see if new orders are in and add to existing goals to be able to treat both issues.   Norman Sharps, PT, DPT Physical Therapist - [facility]  12/06/24, 2:50 PM  "

## 2024-12-11 ENCOUNTER — Ambulatory Visit

## 2024-12-12 ENCOUNTER — Ambulatory Visit

## 2024-12-13 ENCOUNTER — Ambulatory Visit

## 2024-12-18 ENCOUNTER — Ambulatory Visit

## 2024-12-20 ENCOUNTER — Ambulatory Visit

## 2024-12-20 ENCOUNTER — Telehealth: Payer: Medicare Other | Admitting: Neurology

## 2024-12-25 ENCOUNTER — Ambulatory Visit

## 2024-12-27 ENCOUNTER — Ambulatory Visit

## 2025-01-02 ENCOUNTER — Ambulatory Visit: Admitting: Nurse Practitioner

## 2025-01-27 ENCOUNTER — Ambulatory Visit

## 2025-02-01 ENCOUNTER — Other Ambulatory Visit

## 2025-02-05 ENCOUNTER — Ambulatory Visit: Admitting: Urology

## 2025-03-16 ENCOUNTER — Other Ambulatory Visit

## 2025-03-20 ENCOUNTER — Ambulatory Visit: Admitting: Urology
